# Patient Record
Sex: Female | Born: 1970
Health system: Southern US, Community
[De-identification: ages and names within clinical notes are randomized; demographics above are authoritative.]

## PROBLEM LIST (undated history)

## (undated) DIAGNOSIS — G4730 Sleep apnea, unspecified: Secondary | ICD-10-CM

## (undated) DIAGNOSIS — J302 Other seasonal allergic rhinitis: Secondary | ICD-10-CM

## (undated) DIAGNOSIS — K651 Peritoneal abscess: Secondary | ICD-10-CM

## (undated) DIAGNOSIS — E785 Hyperlipidemia, unspecified: Secondary | ICD-10-CM

## (undated) DIAGNOSIS — T7840XA Allergy, unspecified, initial encounter: Secondary | ICD-10-CM

## (undated) DIAGNOSIS — M1990 Unspecified osteoarthritis, unspecified site: Secondary | ICD-10-CM

## (undated) DIAGNOSIS — Z8742 Personal history of other diseases of the female genital tract: Secondary | ICD-10-CM

## (undated) DIAGNOSIS — K219 Gastro-esophageal reflux disease without esophagitis: Secondary | ICD-10-CM

## (undated) DIAGNOSIS — Z932 Ileostomy status: Secondary | ICD-10-CM

## (undated) DIAGNOSIS — K5720 Diverticulitis of large intestine with perforation and abscess without bleeding: Secondary | ICD-10-CM

## (undated) DIAGNOSIS — Z9889 Other specified postprocedural states: Secondary | ICD-10-CM

## (undated) DIAGNOSIS — R011 Cardiac murmur, unspecified: Secondary | ICD-10-CM

## (undated) DIAGNOSIS — N943 Premenstrual tension syndrome: Secondary | ICD-10-CM

## (undated) DIAGNOSIS — I10 Essential (primary) hypertension: Secondary | ICD-10-CM

## (undated) DIAGNOSIS — Z8759 Personal history of other complications of pregnancy, childbirth and the puerperium: Secondary | ICD-10-CM

## (undated) DIAGNOSIS — Z8709 Personal history of other diseases of the respiratory system: Secondary | ICD-10-CM

## (undated) DIAGNOSIS — Z8619 Personal history of other infectious and parasitic diseases: Secondary | ICD-10-CM

## (undated) DIAGNOSIS — Z86010 Personal history of colonic polyps: Secondary | ICD-10-CM

## (undated) DIAGNOSIS — Z90711 Acquired absence of uterus with remaining cervical stump: Secondary | ICD-10-CM

## (undated) DIAGNOSIS — IMO0002 Reserved for concepts with insufficient information to code with codable children: Secondary | ICD-10-CM

## (undated) DIAGNOSIS — R51 Headache: Secondary | ICD-10-CM

## (undated) DIAGNOSIS — D249 Benign neoplasm of unspecified breast: Secondary | ICD-10-CM

## (undated) DIAGNOSIS — M549 Dorsalgia, unspecified: Secondary | ICD-10-CM

## (undated) HISTORY — DX: Personal history of other infectious and parasitic diseases: Z86.19

## (undated) HISTORY — DX: Ileostomy status: Z93.2

## (undated) HISTORY — DX: Benign neoplasm of unspecified breast: D24.9

## (undated) HISTORY — DX: Hyperlipidemia, unspecified: E78.5

## (undated) HISTORY — DX: Personal history of colonic polyps: Z86.010

## (undated) HISTORY — DX: Personal history of other complications of pregnancy, childbirth and the puerperium: Z87.59

## (undated) HISTORY — DX: Cardiac murmur, unspecified: R01.1

## (undated) HISTORY — DX: Personal history of other diseases of the female genital tract: Z87.42

## (undated) HISTORY — DX: Allergy, unspecified, initial encounter: T78.40XA

## (undated) HISTORY — DX: Gastro-esophageal reflux disease without esophagitis: K21.9

## (undated) HISTORY — DX: Other specified postprocedural states: Z98.890

## (undated) HISTORY — DX: Personal history of other diseases of the respiratory system: Z87.09

## (undated) HISTORY — DX: Sleep apnea, unspecified: G47.30

## (undated) HISTORY — DX: Reserved for concepts with insufficient information to code with codable children: IMO0002

## (undated) HISTORY — DX: Unspecified osteoarthritis, unspecified site: M19.90

## (undated) HISTORY — PX: COLON SURGERY: SHX602

## (undated) HISTORY — PX: COLONOSCOPY: SHX174

## (undated) HISTORY — PX: ABDOMINAL HYSTERECTOMY: SHX81

## (undated) HISTORY — DX: Peritoneal abscess: K65.1

## (undated) HISTORY — DX: Dorsalgia, unspecified: M54.9

## (undated) HISTORY — DX: Premenstrual tension syndrome: N94.3

## (undated) HISTORY — DX: Other seasonal allergic rhinitis: J30.2

## (undated) HISTORY — DX: Acquired absence of uterus with remaining cervical stump: Z90.711

---

## 1998-02-18 DIAGNOSIS — Z9889 Other specified postprocedural states: Secondary | ICD-10-CM

## 1998-02-18 DIAGNOSIS — R87619 Unspecified abnormal cytological findings in specimens from cervix uteri: Secondary | ICD-10-CM

## 1998-02-18 DIAGNOSIS — IMO0002 Reserved for concepts with insufficient information to code with codable children: Secondary | ICD-10-CM

## 1998-02-18 HISTORY — PX: CERVICAL CONE BIOPSY: SUR198

## 1998-02-18 HISTORY — DX: Reserved for concepts with insufficient information to code with codable children: IMO0002

## 1998-02-18 HISTORY — DX: Other specified postprocedural states: Z98.890

## 1998-02-18 HISTORY — DX: Unspecified abnormal cytological findings in specimens from cervix uteri: R87.619

## 2002-02-18 HISTORY — PX: BREAST SURGERY: SHX581

## 2003-02-05 ENCOUNTER — Emergency Department (HOSPITAL_COMMUNITY): Admission: EM | Admit: 2003-02-05 | Discharge: 2003-02-05 | Payer: Self-pay | Admitting: Emergency Medicine

## 2003-02-19 DIAGNOSIS — Z8759 Personal history of other complications of pregnancy, childbirth and the puerperium: Secondary | ICD-10-CM

## 2003-02-19 DIAGNOSIS — Z5189 Encounter for other specified aftercare: Secondary | ICD-10-CM

## 2003-02-19 HISTORY — DX: Personal history of other complications of pregnancy, childbirth and the puerperium: Z87.59

## 2003-02-19 HISTORY — PX: ECTOPIC PREGNANCY SURGERY: SHX613

## 2003-02-19 HISTORY — DX: Encounter for other specified aftercare: Z51.89

## 2003-02-19 HISTORY — PX: SALPINGECTOMY: SHX328

## 2005-06-16 ENCOUNTER — Emergency Department (HOSPITAL_COMMUNITY): Admission: AD | Admit: 2005-06-16 | Discharge: 2005-06-16 | Payer: Self-pay | Admitting: Emergency Medicine

## 2006-02-18 HISTORY — PX: MYOMECTOMY: SHX85

## 2006-05-01 ENCOUNTER — Encounter: Payer: Self-pay | Admitting: Family Medicine

## 2006-05-01 LAB — CONVERTED CEMR LAB: Prolactin: 5.7 ng/mL

## 2006-07-09 ENCOUNTER — Encounter: Admission: RE | Admit: 2006-07-09 | Discharge: 2006-07-09 | Payer: Self-pay | Admitting: Obstetrics and Gynecology

## 2006-07-20 DIAGNOSIS — Z9889 Other specified postprocedural states: Secondary | ICD-10-CM

## 2006-07-20 HISTORY — DX: Other specified postprocedural states: Z98.890

## 2006-08-01 ENCOUNTER — Ambulatory Visit (HOSPITAL_COMMUNITY): Admission: RE | Admit: 2006-08-01 | Discharge: 2006-08-02 | Payer: Self-pay | Admitting: Obstetrics and Gynecology

## 2006-08-01 ENCOUNTER — Encounter (INDEPENDENT_AMBULATORY_CARE_PROVIDER_SITE_OTHER): Payer: Self-pay | Admitting: Obstetrics and Gynecology

## 2006-09-17 ENCOUNTER — Encounter (INDEPENDENT_AMBULATORY_CARE_PROVIDER_SITE_OTHER): Payer: Self-pay | Admitting: *Deleted

## 2006-09-22 ENCOUNTER — Ambulatory Visit: Payer: Self-pay | Admitting: Family Medicine

## 2006-09-22 DIAGNOSIS — L609 Nail disorder, unspecified: Secondary | ICD-10-CM | POA: Insufficient documentation

## 2006-12-20 DIAGNOSIS — Z8742 Personal history of other diseases of the female genital tract: Secondary | ICD-10-CM

## 2006-12-20 HISTORY — DX: Personal history of other diseases of the female genital tract: Z87.42

## 2007-02-19 HISTORY — PX: POLYPECTOMY: SHX149

## 2007-02-19 HISTORY — PX: DILATION AND CURETTAGE OF UTERUS: SHX78

## 2007-04-25 ENCOUNTER — Emergency Department (HOSPITAL_COMMUNITY): Admission: EM | Admit: 2007-04-25 | Discharge: 2007-04-25 | Payer: Self-pay | Admitting: Family Medicine

## 2007-06-08 ENCOUNTER — Ambulatory Visit (HOSPITAL_COMMUNITY): Admission: RE | Admit: 2007-06-08 | Discharge: 2007-06-08 | Payer: Self-pay | Admitting: Obstetrics and Gynecology

## 2007-06-08 ENCOUNTER — Encounter (INDEPENDENT_AMBULATORY_CARE_PROVIDER_SITE_OTHER): Payer: Self-pay | Admitting: Obstetrics and Gynecology

## 2007-07-09 ENCOUNTER — Encounter (INDEPENDENT_AMBULATORY_CARE_PROVIDER_SITE_OTHER): Payer: Self-pay | Admitting: Family Medicine

## 2007-08-03 ENCOUNTER — Ambulatory Visit: Payer: Self-pay | Admitting: Family Medicine

## 2007-08-03 ENCOUNTER — Encounter: Payer: Self-pay | Admitting: *Deleted

## 2007-08-03 DIAGNOSIS — J029 Acute pharyngitis, unspecified: Secondary | ICD-10-CM | POA: Insufficient documentation

## 2007-08-03 LAB — CONVERTED CEMR LAB: Rapid Strep: NEGATIVE

## 2007-08-18 ENCOUNTER — Ambulatory Visit: Payer: Self-pay | Admitting: Family Medicine

## 2007-08-18 DIAGNOSIS — M25519 Pain in unspecified shoulder: Secondary | ICD-10-CM

## 2007-08-25 ENCOUNTER — Encounter: Payer: Self-pay | Admitting: Family Medicine

## 2007-08-25 DIAGNOSIS — L2089 Other atopic dermatitis: Secondary | ICD-10-CM

## 2008-02-19 HISTORY — PX: ROBOTIC ASSISTED LAP VAGINAL HYSTERECTOMY: SHX2362

## 2008-08-11 ENCOUNTER — Encounter: Payer: Self-pay | Admitting: Family Medicine

## 2008-08-18 DIAGNOSIS — Z90711 Acquired absence of uterus with remaining cervical stump: Secondary | ICD-10-CM

## 2008-08-18 HISTORY — DX: Acquired absence of uterus with remaining cervical stump: Z90.711

## 2008-08-19 ENCOUNTER — Encounter (INDEPENDENT_AMBULATORY_CARE_PROVIDER_SITE_OTHER): Payer: Self-pay | Admitting: Obstetrics and Gynecology

## 2008-08-19 ENCOUNTER — Ambulatory Visit (HOSPITAL_COMMUNITY): Admission: RE | Admit: 2008-08-19 | Discharge: 2008-08-20 | Payer: Self-pay | Admitting: Obstetrics and Gynecology

## 2008-11-02 ENCOUNTER — Encounter: Payer: Self-pay | Admitting: Family Medicine

## 2008-11-09 ENCOUNTER — Encounter (INDEPENDENT_AMBULATORY_CARE_PROVIDER_SITE_OTHER): Payer: Self-pay | Admitting: *Deleted

## 2008-11-26 ENCOUNTER — Encounter (INDEPENDENT_AMBULATORY_CARE_PROVIDER_SITE_OTHER): Payer: Self-pay | Admitting: *Deleted

## 2008-11-26 DIAGNOSIS — Z87891 Personal history of nicotine dependence: Secondary | ICD-10-CM

## 2009-06-13 ENCOUNTER — Ambulatory Visit: Payer: Self-pay | Admitting: Family Medicine

## 2009-06-14 ENCOUNTER — Encounter: Payer: Self-pay | Admitting: *Deleted

## 2009-11-06 ENCOUNTER — Emergency Department (HOSPITAL_COMMUNITY)
Admission: EM | Admit: 2009-11-06 | Discharge: 2009-11-06 | Payer: Self-pay | Source: Home / Self Care | Admitting: Family Medicine

## 2009-11-08 ENCOUNTER — Emergency Department (HOSPITAL_COMMUNITY)
Admission: EM | Admit: 2009-11-08 | Discharge: 2009-11-08 | Payer: Self-pay | Source: Home / Self Care | Admitting: Family Medicine

## 2009-11-15 ENCOUNTER — Encounter: Admission: RE | Admit: 2009-11-15 | Discharge: 2009-11-15 | Payer: Self-pay | Admitting: Obstetrics and Gynecology

## 2009-11-16 ENCOUNTER — Emergency Department (HOSPITAL_COMMUNITY): Admission: EM | Admit: 2009-11-16 | Discharge: 2009-11-16 | Payer: Self-pay | Admitting: Emergency Medicine

## 2009-11-18 HISTORY — PX: BREAST LUMPECTOMY: SHX2

## 2009-11-23 ENCOUNTER — Encounter: Admission: RE | Admit: 2009-11-23 | Discharge: 2009-11-23 | Payer: Self-pay | Admitting: Obstetrics and Gynecology

## 2009-12-15 ENCOUNTER — Ambulatory Visit (HOSPITAL_BASED_OUTPATIENT_CLINIC_OR_DEPARTMENT_OTHER): Admission: RE | Admit: 2009-12-15 | Discharge: 2009-12-15 | Payer: Self-pay | Admitting: General Surgery

## 2009-12-15 ENCOUNTER — Encounter: Admission: RE | Admit: 2009-12-15 | Discharge: 2009-12-15 | Payer: Self-pay | Admitting: General Surgery

## 2010-03-11 ENCOUNTER — Encounter: Payer: Self-pay | Admitting: Obstetrics and Gynecology

## 2010-03-20 NOTE — Assessment & Plan Note (Signed)
Summary: CONGESTED/SINUS/[facility]   Vital Signs:  Patient profile:   40 year old female Weight:      171 pounds Temp:     98.8 degrees F oral BP sitting:   124 / 80  (right arm)  Vitals Entered By: Arlyss Repress CMA, (June 13, 2009 1:59 PM) CC: right ear pain, chills, sinus pressure, cough and congestion x 2 days. Is Patient Diabetic? No Pain Assessment Patient in pain? yes     Location: head Intensity: 6 Onset of pain  x2d   Primary Care Provider:  Denny Levy MD  CC:  right ear pain, chills, sinus pressure, and cough and congestion x 2 days.Marland Kitchen  History of Present Illness: [name] comes in today for URI symptoms since yesterday.  Woke up with a pounding frontal headache, sharp right ear pain, nasal congestion and dry cough.  Last night felt sweaty and chilled but did not take her temp.  No sore throat.  Tried some afrin which helped temporarily.  Had difficulty breathing last night (through her nose).  Aches all over.  She does smoke.  She is allergic to PCN and Keflex.   Habits & Providers  Alcohol-Tobacco-Diet     Tobacco Status: current     Tobacco Counseling: to quit use of tobacco products  Current Medications (verified): 1)  Fluticasone Propionate 50 Mcg/act  Susp (Fluticasone Propionate) .... 2 Sprays Into Each Nostril Daily For 10 Days For Sinus Inflammation 2)  Triderm 0.1 %  Crea (Triamcinolone Acetonide) .... Apply Once Daily or Two Times A Day To Affected Area As Directed Disp 30 G Tube 3)  Clobetasol Propionate 0.05 % Oint (Clobetasol Propionate) .... Apply To Affected Area Two Times A Day X2 Weeks. Dispense One 60 Gram Tube 4)  Taclonex 0.005-0.064 % Oint (Calcipotriene-Betameth Diprop) .... Apply To Affected Areas On Arms and Hands Once Daily For Only 4 Weeks Total Disp Qs For 100 Gram Per Week 5)  Fexofenadine Hcl 180 Mg Tabs (Fexofenadine Hcl) .Marland Kitchen.. 1 Tab By Mouth Daily 6)  Azithromycin 250 Mg Tabs (Azithromycin) .... 2 Tab By Mouth On Day 1 Then 1 Tab By Mouth X 6  Days  Allergies (verified): 1)  ! Penicillin 2)  ! Cephalosporins  Physical Exam  General:  alert, well-developed, NAD vitals reviewed Head:  TTP over bilateral maxillary and frontal sinuses Eyes:  pupils equal, pupils round, corneas and lenses clear, and no injection.   Ears:  L ear normal, right ear dull with fluid behind the drum Nose:  nasal mucosa edematous and pale, clear rhinorrhea Mouth:  oropharynx with postnasal drip Lungs:  Normal respiratory effort, chest expands symmetrically. Lungs are clear to auscultation, no crackles or wheezes. Heart:  Normal rate and regular rhythm. S1 and S2 normal without gallop, murmur, click, rub or other extra sounds. Cervical Nodes:  No lymphadenopathy noted   Impression & Recommendations:  Problem # 1:  SINUSITIS (ICD-473.9) Assessment New  Flonase nasal and allegra to open nasal passages and dry up secretions.  If no better in 2-3 days, can take course of azithromycin. (Allergic to PCN and CSPNs).   Her updated medication list for this problem includes:    Fluticasone Propionate 50 Mcg/act Susp (Fluticasone propionate) .Marland Kitchen... 2 sprays into each nostril daily for 10 days for sinus inflammation    Azithromycin 250 Mg Tabs (Azithromycin) .Marland Kitchen... 2 tab by mouth on day 1 then 1 tab by mouth x 6 days  Orders: [facility]- Est  Level 4 ([address])  Complete Medication List:  1)  Fluticasone Propionate 50 Mcg/act Susp (Fluticasone propionate) .... 2 sprays into each nostril daily for 10 days for sinus inflammation 2)  Triderm 0.1 % Crea (Triamcinolone acetonide) .... Apply once daily or two times a day to affected area as directed disp 30 g tube 3)  Clobetasol Propionate 0.05 % Oint (Clobetasol propionate) .... Apply to affected area two times a day x2 weeks. dispense one 60 gram tube 4)  Taclonex 0.005-0.064 % Oint (Calcipotriene-betameth diprop) .... Apply to affected areas on arms and hands once daily for only 4 weeks total disp qs for 100 gram per week 5)   Fexofenadine Hcl 180 Mg Tabs (Fexofenadine hcl) .Marland Kitchen.. 1 tab by mouth daily 6)  Azithromycin 250 Mg Tabs (Azithromycin) .... 2 tab by mouth on day 1 then 1 tab by mouth x 6 days Prescriptions: FLUTICASONE PROPIONATE 50 MCG/ACT  SUSP (FLUTICASONE PROPIONATE) 2 sprays into each nostril daily for 10 days for sinus inflammation  #1 x 3   Entered and Authorized by:   Ardeen Garland  MD   Signed by:   Ardeen Garland  MD on 06/13/2009   Method used:   Electronically to        [facility] [address] [phone]* (retail)       [address]       [place], [place]  [address]       Ph: [phone]       Fax: [phone]   RxID:   [id number] AZITHROMYCIN 250 MG TABS (AZITHROMYCIN) 2 tab by mouth on day 1 then 1 tab by mouth x 6 days  #8 x 0   Entered and Authorized by:   Ardeen Garland  MD   Signed by:   Ardeen Garland  MD on 06/13/2009   Method used:   Electronically to        [facility] [address] [phone]* (retail)       [address]       [place], [place]  [address]       Ph: [phone]       Fax: [phone]   RxID:   [id number] FEXOFENADINE HCL 180 MG TABS (FEXOFENADINE HCL) 1 tab by mouth daily  #30 x 3   Entered and Authorized by:   Ardeen Garland  MD   Signed by:   Ardeen Garland  MD on 06/13/2009   Method used:   Electronically to        [facility] [address] [phone]* (retail)       [address]       [place], [place]  [address]       Ph: [phone]       Fax: [phone]   RxID:   [id number]

## 2010-03-20 NOTE — Letter (Signed)
Summary: Out of Work  [facility]  [address]   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         Sincerely,    Loralee Pacas CMA

## 2010-05-02 LAB — DIFFERENTIAL
Basophils Absolute: 0 10*3/uL (ref 0.0–0.1)
Eosinophils Absolute: 0.2 10*3/uL (ref 0.0–0.7)
Lymphocytes Relative: 43 % (ref 12–46)
Lymphs Abs: 3.1 10*3/uL (ref 0.7–4.0)
Neutrophils Relative %: 42 % — ABNORMAL LOW (ref 43–77)

## 2010-05-02 LAB — CBC
Platelets: 384 10*3/uL (ref 150–400)
RBC: 4.26 MIL/uL (ref 3.87–5.11)
WBC: 7.2 10*3/uL (ref 4.0–10.5)

## 2010-05-02 LAB — BASIC METABOLIC PANEL
Calcium: 9.6 mg/dL (ref 8.4–10.5)
Creatinine, Ser: 0.67 mg/dL (ref 0.4–1.2)
GFR calc Af Amer: >60 mL/min (ref >60)

## 2010-05-02 LAB — POCT HEMOGLOBIN-HEMACUE: Hemoglobin: 13.3 g/dL (ref 12.0–15.0)

## 2010-05-27 LAB — CBC
HCT: 38.8 % (ref 36.0–46.0)
Platelets: 225 10*3/uL (ref 150–400)
WBC: 16.3 10*3/uL — ABNORMAL HIGH (ref 4.0–10.5)

## 2010-05-28 LAB — PREGNANCY, URINE: Preg Test, Ur: NEGATIVE

## 2010-05-28 LAB — BASIC METABOLIC PANEL
BUN: 4 mg/dL — ABNORMAL LOW (ref 6–23)
Calcium: 10 mg/dL (ref 8.4–10.5)
Creatinine, Ser: 0.75 mg/dL (ref 0.4–1.2)
GFR calc non Af Amer: >60 mL/min (ref >60)
Potassium: 3.5 mEq/L (ref 3.5–5.1)

## 2010-05-28 LAB — ABO/RH: ABO/RH(D): A POS

## 2010-05-28 LAB — CBC
HCT: 45.9 % (ref 36.0–46.0)
Platelets: 263 10*3/uL (ref 150–400)
WBC: 7.5 10*3/uL (ref 4.0–10.5)

## 2010-05-28 LAB — TYPE AND SCREEN: Antibody Screen: NEGATIVE

## 2010-06-21 ENCOUNTER — Other Ambulatory Visit: Payer: Self-pay | Admitting: Family Medicine

## 2010-06-21 MED ORDER — FLUTICASONE PROPIONATE 50 MCG/ACT NA SUSP: NASAL | Status: DC

## 2010-06-21 NOTE — Telephone Encounter (Signed)
Received refill request for fluticasone prop 50 mcg. Dr. Mauricio Po verbally gives order that this may be refilled
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
## 2010-07-03 NOTE — Op Note (Signed)
[name], [name]          ACCOUNT NO.:  [id number]   MEDICAL RECORD NO.:  [id number]          PATIENT TYPE:  AMB   LOCATION:  DAY                          FACILITY:  [facility]   PHYSICIAN:  Crist Fat. Rivard, M.D. DATE OF BIRTH:  [birth year]   DATE OF PROCEDURE:  08/01/2006  DATE OF DISCHARGE:                               OPERATIVE REPORT   PREOPERATIVE DIAGNOSIS:  Symptomatic uterine fibroids.   POSTOPERATIVE DIAGNOSIS:  Symptomatic uterine fibroids with pelvic  adhesions.   ANESTHESIA:  General.   ANESTHESIOLOGIST:  Jenelle Mages. Rica Mast, M.D.   PROCEDURE:  Robotic-assisted laparoscopy and myomectomy.   SURGEON:  Crist Fat. Rivard, M.D.   ASSISTANTMarquis Lunch. Lowell Guitar, P.A.   ESTIMATED BLOOD LOSS:  Less than 100 mL.   PROCEDURE:  After being informed of the planned procedure with possible  complications including bleeding, infection, injury to other organs,  need for laparotomy, transient postop edema, hospital stay and expected  recovery, informed consent is obtained.  The patient is taken to OR #10  and given general anesthesia with endotracheal intubation without  complication.  She is placed in the lithotomy position on a sticky  mattress with her arms padded and tucked on each side, as well as knee-  high sequential compression devices.  Her chest is then taped to the  table.  She is prepped and draped in a sterile fashion.  Her pelvic exam  reveals an anteverted uterus with a dominant left cornual fibroid and 2  adnexa that feel normal.  A weighted speculum is inserted, anterior lip  of the cervix is grasped with a tenaculum and forceps in the uterus is  sounded at 8.5 cm; this allows easy entry of a ZUMI intrauterine  manipulator and the balloon is inflated with saline, 5 mL.  The  tenaculum forceps is left on the anterior lip to allow [place] to retract the  uterus.  A Foley catheter is then inserted.   The supraumbilical incision is decided at 15 cm above the  fundal height;  this location is infiltrated with Marcaine 0.25 and we perform a  vertical 10-mm incision, which is brought down to the fascia.  Fascia is  grasped with Kocher forceps and incised and the peritoneum is entered  bluntly.  A pursestring suture of 0 Vicryl is placed on the fascia and a  10-mm Hasson trocar is inserted easily and held in place with  pursestring suture; this allows [place] to insufflate a pneumoperitoneum with  CO2 at a maximum pressure of 15 mmHg.  Trocar placement is then measured  in the site and then we place two 8-mm robotic trocars on the left and  one 8-mm robotic trocar on the right, as well as a 10-mm patient-side  assistant trocar; all of these trocars are inserted under direct  visualization after infiltrating with Marcaine 0.25%.   We can now dock the robot and all this preparation is completed in 40  minutes.   The camera is inserted as well as a monopolar scissors in arm #1, Gyrus  PK forceps in arm #2 and tenaculum forceps in arm #3.  Observation:  The uterus is enlarged by a dominant left cornual fibroid  measuring approximately 6 cm.  We also identify 2 lower uterine segment  posterior fibroids measuring 1.5 to 2 cm, 1 subserosal fibroid in the  right broad ligament measuring 1 cm.  We note the absence of a right  tube.  The right ovary is partially adherent to the uterus and those  adhesions are sharply dissected.  We also note that the bowel and is  adherent to the lower posterior portions of the uterus and those  adhesions are also sectioned and dissected sharply.  The right tube is  partially adherent to the bowel and this is also sharply dissected until  the tube is completely freed and the tube has a normal appearance with a  normal fimbriae; the left ovary is also normal.  Appendix is visualized  and normal.  Liver is visualized and normal.  Gallbladder appears a  little tensed, but otherwise normal.   We proceed with infiltration of  the left cornual fibroid using  vasopressin 1 unit per 1 mL, until complete blanching of the serosa,  which allows [place] now to open the serosa with monopolar scissors, grab the  fibroid and sharply and bluntly dissect it.  One fibroid is removed,  measuring approximately 4 cm, and a subsequent fibroid is removed  through the same incision, measuring approximately 6 cm.  This is  achieved with well-controlled hemostasis.  Instruments are then changed  for 2 needle holders and we proceed with closure in multiple layers of  this myometrial incision.  The total length of the incision is  approximately 6 cm and we can do 2 layers of figure-of-eight stitches of  0 Vicryl to reapproximate the myometrium.  Hemostasis is adequate.  We  then place our attention on the 2 small lower uterine segment posterior  fibroid and we infiltrate the serosa with vasopressin 1 unit in 1 mL,  open the serosa and grab the fibroid with tenaculum forceps, sharply and  bluntly dissect them out.  Fibroids are deposited in the posterior cul-  de-sac.  The small right broad ligament fibroid is also removed in a  similar fashion.  After evaluating the uterus, decision is made to  proceed with removal of another fibroid identified on the upper  posterior wall.  We infiltrate the serosa with vasopressin 1 unit in 1  mL until complete blanching, open the serosa with monopolar scissors,  grab the fibroid with tenaculum and sharply and bluntly dissect it away  from the myometrium.  Again, the myometrium is closed; this time, 1  layer is sufficient of figure-of-eight stitches of 0 Vicryl.  The lower  uterine segment defect on a 1-cm distance is closed with 2 figure-of-  eight stitches of 0 Vicryl.   Using 2-0 Vicryl, we can now close the 2 largest incisions, the upper  posterior one measuring 4 cm with a baseball stitch of 2-0 Vicryl and the anterior one measuring 6 cm with a baseball stitch of 2-0 Vicryl.  We then irrigate  profusely, note a satisfactory hemostasis and move for  morcellation.   The 10-mm assistant trocar is removed.  The incision is extended and a  15-mm morcellator is inserted easily under direct visualization.  We  then proceed with systematic morcellation of all fibroids previously  mentioned, which removes a total of 144 g of fibroid material.  The  morcellation time took 20 minutes.  We again irrigate profusely with  warm saline,  note the satisfactory hemostasis and we then place a half  sheet of Intercede on the posterior incisions and another half-sheet  Intercede on the anterior incision.  Trocars are then removed under  direct visualization after evacuating the pneumoperitoneum and after  undocking the robots.   The fascia of the supraumbilical incision is closed with the previously-  placed pursestring suture of 0 Vicryl.  The fascia of the 15 mm right-  sided incision is closed with a running suture of 0 Vicryl and incision  skin is closed with subcuticular suture of 3-0 Monocryl and Steri-  Strips.   The ZUMI intrauterine manipulator is removed.  The tenaculum was removed  during the procedure inadvertently.  The speculum is inserted to  evaluate the anterior lip of the cervix and a small laceration is  repaired with 2 figure-of-eight stitches of 2-0 Vicryl.  Hemostasis is  adequate.   Instrument and sponge count is complete x2.  Estimated blood loss is  less than 100 mL.  The procedure is very well tolerated by the patient,  who is taken to recovery room in a well and stable condition.  Total  operative time from start time to the end of the procedure was 4 hours  15 minutes.      Crist Fat Rivard, M.D.  Electronically Signed     SAR/MEDQ  D:  08/01/2006  T:  08/02/2006  Job:  [id number]

## 2010-07-03 NOTE — Op Note (Signed)
[name], [name]          ACCOUNT NO.:  [id number]   MEDICAL RECORD NO.:  [id number]          PATIENT TYPE:  AMB   LOCATION:  DAY                          FACILITY:  [facility]   PHYSICIAN:  Sandra A. Rivard, M.D. DATE OF BIRTH:  1970-10-26   DATE OF PROCEDURE:  08/19/2008  DATE OF DISCHARGE:                               OPERATIVE REPORT   PREOPERATIVE DIAGNOSIS:  Symptomatic uterine fibroids, dysfunctional  uterine bleeding, and chronic pelvic pain.   POSTOPERATIVE DIAGNOSIS:  Symptomatic uterine fibroids, dysfunctional  uterine bleeding, and chronic pelvic pain with pelvic adhesions.   ANESTHESIA:  General, Dr. Rica Mast.   PROCEDURE:  Laparoscopic supracervical hysterectomy with robotic  assistance and lysis of adhesions.   SURGEON:  Crist Fat. Rivard, M.D.   ASSISTANT:  Henreitta Leber, physician's assistant.   ESTIMATED BLOOD LOSS:  Minimal.   PROCEDURE:  After being informed of the planned procedure with possible  complications including bleeding, infection, injury to bowels, bladder  or ureters, postoperative recurrent bleeding due to cervical stump,  expected hospital stay, expected recovery time, postoperative transient  facial edema and the need to pursue a fairly Pap smears, informed  consent is obtained.  The patient was taken to OR #10, given general  anesthesia with endotracheal intubation without any complication.  She  is placed in lithotomy position on a sticky mattress, arms padded and  tucked on each side with knee-high sequential compressive devices, and  her chest taped to the table.  She is prepped and draped in a sterile  fashion.  Pelvic exam reveals an anteverted uterus approximately 10  weeks' in size, mobile, and adnexa are not felt through the pelvic exam.  A weighted speculum is inserted.  Anterior lip of the cervix is grasped  with a tenaculum forceps, and we proceed with cervical dilatation using  Hegar dilator until #27.  The uterus is sounded  at 7 cm.  A 6-cm Rumi  intrauterine manipulator with a 3.5 KOH ring is easily inserted.  Balloon is inflated with 5 mL of saline.  Tenaculum is repositioned, and  weighted speculum is removed.  A Foley catheter is then inserted in her  bladder.   We infiltrate the supraumbilical previous incision with Marcaine 0.25  and perform a 1-cm vertical incision which is brought down bluntly to  the fascia.  The fascia is identified and grasped with Kocher forceps  and incised with Mayo scissors.  Peritoneum is entered bluntly.  A  pursestring suture of 0 Vicryl is placed on the fascia, and a 10-mm  Hassan trocar is easily inserted in the abdominal cavity, held in place  with the previously placed pursestring suture.  A camera is inserted to  note an irregularly shaped uterus with multiple fibroids, largest being  right cornual approximately 2.5 cm.  Adhesions from the colon to the  fundus and posterior aspect of the uterus is noted, so posterior cul-de-  sac is not visible at this time.  Anterior cul-de-sac appears normal.  Appendix is visualized and normal.   Trocar placement is decided, and we are going to use the previous trocar  site from her surgery  2 years ago.  We then placed two 8-mm robotic  trocars on the left after infiltrating the sites with Marcaine 0.25  under direct visualization, one 8-mm robotic trocar on the right after  infiltrating with Marcaine 0.25, and one 10-mm right-sided patient-side  assistant trocar after infiltrating with Marcaine 0.25.  The robot is  docked on the patient's left side.  Her legs are lowered.  Preparation  and docking time is complete in 45 minutes.   A monopolar scissors is placed in arm #1, a PK gyrus forceps in arm #2,  and a Cobra forceps in arm #3.  We initiate the procedure with  systematic lysis of adhesion using monopolar scissors and traction.  This is done in an easy fashion, freeing the fundus of the uterus.  We  then see the posterior  cul-de-sac which has small filmy adhesions that  are easily removed.  There is also adhesion from the right ovary that is  completely stuck to the posterior right aspect of the uterus, and that  is also sharply dissected until the ovary is freed.  There is no right  tube, the patient being status post right salpingectomy.  The left ovary  appears normal and the left tube shows a sign of tubal ligation.   We start from the left side and cauterize the utero-ovarian ligament and  section it, cauterize the tube and section it, cauterize the round  ligament and section it.  This gives [place] access to the broad ligament,  and the anterior sheath of the broad ligament is easily dissected  sharply all the way through the anterior cul-de-sac.  The dissection is  easily performed, and the bladder is easily pushed back from the vaginal  fornix which is easily identified with the KOH ring.  The posterior  sheath of the left-sided broad ligament is then sharply dissected to  skeletonize the uterine artery, keeping the left ureter under direct  visualization.  The vessels are then easily identified and cauterized in  their ascending branches near the KOH ring.  We moved to the right side  and proceed in the exact same fashion, initially cauterizing the utero-  ovarian ligament and sectioning it, cauterizing the round ligament and  sectioning it, entering the broad ligament anteriorly, sharply  dissecting, pushing the bladder down below the KOH ring.  The posterior  sheath of the broad ligament is also brought down sharply to the level  of the uterosacral ligament, keeping the right ureter under direct  visualization.  This gives [place] a complete skeletonization of the uterine  vessels.  Those are then grasped at their ascending location near the  KOH ring and cauterized.  The uterine vessels on both sides are  cauterized with pressure applied on the KOH ring and both ureters under  direct visualization.  We  then proceed with the supracervical  hysterectomy and move 1 cm above the KOH ring.  Using an open monopolar  scissor, we are able to section the body of the uterus away from the  cervix in a 360-degree fashion.  The uterus is then deposited in the  posterior cul-de-sac.  The Rumi is deflated and removed, and the  cervical canal is systematically cauterized, initially with bipolar  cauterization, completed with monopolar cauterization.  We irrigate  profusely with warm saline, notice satisfactory hemostasis on all  pedicles, and also note good peristalsis on both ureters.  Console time  is 1 hour and 16 minutes.  All instruments are removed.  The robot is  undocked.  The 10-mm patient-side robotic trocar is removed and changed  for a morcellator.  We then proceed with systematic morcellation of the  specimen without complication over a 7-minute period.   We again irrigate profusely with warm saline, note a satisfactory  hemostasis, and placed a full sheet of intercede over the cervical  stump.  All trocars are then removed after evacuating the  pneumoperitoneum.  The fascia of the supraumbilical incision is closed  with previously placed pursestring suture of 0 Vicryl.  The fascia of  the 10-mm right-sided trocar is closed with a figure-of-eight stitch of  0 Vicryl.  All incisions are then closed with subcuticular suture of 3-0  Monocryl and Dermabond.  The KOH ring is removed from the vagina.  Cervix is evaluated, and a bleeding site at the tenaculum is controlled  with application of silver nitrate.   Instrument and sponge count is complete x2.  Estimated blood loss is  minimal.  The procedure is well tolerated by the patient who is taken to  recovery room in a well and stable condition.   SPECIMEN:  Body of the uterus without cervix sent to pathology weighing  84 g.      Crist Fat Rivard, M.D.  Electronically Signed     SAR/MEDQ  D:  08/19/2008  T:  08/19/2008  Job:   [id number]

## 2010-07-03 NOTE — H&P (Signed)
[name], [name]          ACCOUNT NO.:  [id number]   MEDICAL RECORD NO.:  [id number]          PATIENT TYPE:  AMB   LOCATION:  DAY                          FACILITY:  [facility]   PHYSICIAN:  Sandra A. Rivard, M.D. DATE OF BIRTH:  [birth year]   DATE OF ADMISSION:  DATE OF DISCHARGE:                              HISTORY & PHYSICAL   HISTORY OF PRESENT ILLNESS:  [name] is a 40 year old married  African American female, para 2-0-2-2, presenting for robotic  supracervical hysterectomy because of symptomatic uterine fibroids,  pelvic pain, and dysfunctional uterine bleeding.  Up until approximately  2 years ago when the patient underwent a myomectomy, the patient's  menses occurred every 18-20 days, flow for 7 days with clotting, and  required the change of a large pad 8 times daily.  Additionally, she  would experience cramping rated as 6/10 on a 10-point pain scale;  decreasing to 4/10 on a 10-point pain scale with ibuprofen 800 mg.  An  endometrial biopsy done at that time was within normal limits showing no  atypia, hyperplasia or malignancy.  Following the myomectomy the  patient's menstrual flow decreased to 7 days with a pad change every 3  hours and decreased clotting.  The patient's cramping remained, but she  achieved greater relief with her usual ibuprofen dosage compared with  relief before the surgery.  A month after patient's myomectomy she  reported intermenstrual bleeding, and a sonohistogram in March 2009  showed an endometrial polyp measuring 0.86 cm x 0.64 cm and a  questionable submucosal fibroid measuring 1.58 x 1.34 x 1.52 cm.  An  anterior fibroid was also observed measuring 1.81 x 1.60 x 1.64 cm.  Uterus and ovaries were within normal size on that study.  Subsequently  patient underwent a hysteroscopic removal of a benign polyp.  No  hyperplasia or malignancy was identified on pathology.  This patient's  first cycle following this procedure was 7 days  long but very, very  light, and then 23 days later she resumed the heavy menstrual bleeding  with clotting and severe cramping.  The patient was given a trial of  Depo-Provera and Toradol in an effort to manage her symptoms, but she  bled daily with the Depo-Provera and though the Toradol did  significantly diminish  her pain it made her so sleepy she could not  function at work.  A pelvic ultrasound March 2010 showed a uterus  measuring 8.16 cm x 5.09 cm x 5.12 cm with a left ovary measuring 2.20  cm x 2.44 cm x 1.56 cm, a right ovary measuring 2.30 cm x 2.79 cm x 1.25  cm.  There was an anterior fibroid measuring 2.01 x 1.38 x 1.79 cm,  another anterior fibroid measuring 1.68 x 1.58 x 1.67 cm, a posterior  fibroid measuring 1.88 x 1.39 x 1.80 cm, and multiple small fibroids all  of which were less than 1 cm.  The patient denied any urinary tract  symptoms, changes in her bowel habits, fever or vaginitis symptoms.  After careful consideration of the chronicity and disruptive nature of  her symptoms along with both  medical and surgical management options,  the patient has decided to proceed with definitive therapy in the form  of hysterectomy.   OBSTETRIC HISTORY:  Gravida 4, para 2-0-2-2.  The patient had 2  spontaneous vaginal births.  She also had an ectopic in 2005 for which  she underwent salpingectomy.   GYNECOLOGICAL HISTORY:  Menarche 40 years old.  The patient uses Depo-  Provera.  Therefore her periods are irregular.  She had an abnormal Pap  smear in 2000 requiring a cone biopsy.  She does have a history of  syphilis.  The patient's Pap smears have been normal since her 2000 cone  biopsy most recent being March 2010.   MEDICAL HISTORY:  1. Premenstrual syndrome.  2. Back pain.  3. Sinusitis.  4. Gastroesophageal reflux disease.   SURGICAL HISTORY:  1. 2004 right breast fibroadenoma excision.  2. 2005 right salpingectomy secondary to ectopic pregnancy.  3. 2008 robotic  myomectomy.  4. 2009 hysteroscopy, D and C for endometrial polyp.  5. The patient does have a history of blood transfusion secondary to      her ectopic pregnancy.  Denies any problems with anesthesia.   FAMILY HISTORY:  Thyroid disease, sarcoidosis, hypertension, diabetes,  bipolar disorder, and thyroid disease.   SOCIAL HISTORY:  The patient is married.  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   HABITS:  The patient smokes 2 packs of cigarettes per week.  She  occasionally consumes alcohol.  Denies any illicit drug use.   CURRENT MEDICATIONS:  Depo-Provera last given April 06, 2008.  Next  injection due Jun 28, 2008.   ALLERGIES:  1. PENICILLIN and KEFLEX both of which cause anaphylaxis.  2. BENADRYL causes itching and a rash.   REVIEW OF SYSTEMS:  The patient does have a history of back pain.  Denies any chest pain, shortness of breath, headache, vision changes,  nausea, vomiting, diarrhea, recent weight loss, night sweats, joint  pain, muscle pain, and except as is mentioned in history present illness  the patient's review of systems is, otherwise, negative.   PHYSICAL EXAMINATION:  Blood pressure is 120/88, pulse 88, respirations  16, temperature 99.3 degrees Fahrenheit orally.  Weight is 167 pounds,  height 5 feet 5 inches tall.  Body mass index 27.8.  NECK:  Supple without masses.  There is no thyromegaly or cervical  adenopathy.  HEART:  Regular rate and rhythm.  LUNGS:  Clear.  BACK:  No CVA tenderness.  ABDOMEN:  No tenderness, masses or organomegaly.  EXTREMITIES:  No clubbing, cyanosis or edema.  EGBUS is within normal limits.  Vagina is rugous.  Cervix is nontender  without lesions.  Uterus appears upper limits of normal size without  tenderness.  Adnexa:  No tenderness or masses.  Rectovaginal:  No  tenderness or masses.   IMPRESSION:  1. Symptomatic uterine fibroids.   2. Dysfunctional uterine bleeding.  3. Pelvic pain.   DISPOSITION:  A discussion was held with patient regarding the  indications for her procedure along with its risks which include but are  not limited to reaction to anesthesia, damage to adjacent organs,  infection, and excessive bleeding.  The patient further understands that  having a supracervical hysterectomy renders her capable of developing  cervical cancer, that she may have cyclical bleeding, that there is a  possibility that a fibroid may grow on her cervical stump.  The  patient  was advised that there is no guarantee that pelvic prolapse will be  prevented by maintaining her cervix.  She is aware that she will  experience some transient postoperative facial edema, that a robotic  procedure takes longer than the traditional open abdominal approach,  that an open abdominal incision may be necessary to complete her surgery  safely, that her expected hospital stay is 1-2 days, and that she should  be able to return to her usual activities in 2-3 weeks.  The patient was  given a MiraLax bowel prep to be completed 24 hours prior to  procedure.  She verbalized understanding of all of these risk and considerations.  The patient has consented to proceed with a robotic supracervical  hysterectomy at [facility] on August 19, 2008 at 7:30  a.m.      Elmira J. Adline Peals.      Crist Fat Rivard, M.D.  Electronically Signed    EJP/MEDQ  D:  08/12/2008  T:  08/13/2008  Job:  [id number]

## 2010-07-03 NOTE — Op Note (Signed)
NAMEELIN, [name]          ACCOUNT NO.:  [id number]   MEDICAL RECORD NO.:  [id number]          PATIENT TYPE:  AMB   LOCATION:  SDC                           FACILITY:  [facility]   PHYSICIAN:  Dois Davenport A. Rivard, M.D. DATE OF BIRTH:  [birth year]   DATE OF PROCEDURE:  06/08/2007  DATE OF DISCHARGE:                               OPERATIVE REPORT   PREOPERATIVE DIAGNOSIS:  Metrorrhagia with endometrial polyp.   POSTOPERATIVE DIAGNOSIS:  Metrorrhagia with endometrial polyp.   ANESTHESIA:  General with Angelica Pou, MD   PROCEDURE:  Paracervical block, hysteroscopy, dilation and curettage.   SURGEON:  Crist Fat. Rivard, M.D., no assistant.   ESTIMATED BLOOD LOSS:  Minimal.   PROCEDURE:  After being informed of the planned procedure with possible  complications including bleeding, infection and injury to uterus,  informed consent is obtained.  The patient is taken to OR #7 given  general anesthesia without any complication and with laryngeal mask  ventilation.  She is placed in the lithotomy position, prepped and  draped in a sterile fashion and her bladder is emptied with an in-and-  out Foley catheter.   Pelvic exam reveals an anteverted uterus, slightly bulky, mobile, two  normal adnexa.  A weighted speculum is inserted, anterior lip of the  cervix was grasped with a tenaculum forceps and we proceed with a  paracervical block using Nesacaine 1% 18 mL in the usual fashion.  Uterus is sounded at 7 cm and the cervix is easily dilated using Hegar  dilator until #27 which allows easy entry of a diagnostic hysteroscope.  With sorbitol perfusion we are able to visualize the entire uterine  cavity which reveals a posterior polyp measuring approximately 0.8 cm  and a somewhat thickened endometrium in the area of the left cornua.  Both tubal ostia are visualized and normal.  We removed the hysteroscope  and perform a sharp curettage of the endometrial cavity which does  remove the polyp  initially and then removed a significant amount of  thickened endometrium.  We reinserted the hysteroscope to visualize a  free cavity, no more polyp and two normal ostia.  Hysteroscope is  removed, tenaculum is removed, instrument and sponge count is complete  x2.  Estimated blood loss is minimal.  Fluid deficit was 15 mL and the  procedure is very well tolerated by the patient who is taken to recovery  room in a well and stable condition.   SPECIMEN:  Endometrial polyp and endometrial curettings sent to  pathology.      Crist Fat Rivard, M.D.  Electronically Signed     SAR/MEDQ  D:  06/08/2007  T:  06/08/2007  Job:  [id number]

## 2010-10-11 ENCOUNTER — Other Ambulatory Visit (INDEPENDENT_AMBULATORY_CARE_PROVIDER_SITE_OTHER): Payer: Self-pay | Admitting: General Surgery

## 2010-10-11 DIAGNOSIS — Z1231 Encounter for screening mammogram for malignant neoplasm of breast: Secondary | ICD-10-CM

## 2010-10-19 ENCOUNTER — Ambulatory Visit
Admission: RE | Admit: 2010-10-19 | Discharge: 2010-10-19 | Disposition: A | Discharge status: Home / Self Care | Payer: BC Managed Care – PPO | Source: Ambulatory Visit | Attending: General Surgery | Admitting: General Surgery

## 2010-10-19 DIAGNOSIS — Z1231 Encounter for screening mammogram for malignant neoplasm of breast: Secondary | ICD-10-CM

## 2010-11-13 LAB — CBC
Hemoglobin: 14.1
RBC: 4.41
WBC: 8.4

## 2010-11-13 LAB — PREGNANCY, URINE: Preg Test, Ur: NEGATIVE

## 2010-12-06 LAB — CBC
HCT: 40.7
Hemoglobin: 11.8 — ABNORMAL LOW
Platelets: 329
RDW: 12.2
RDW: 12.2

## 2010-12-06 LAB — BASIC METABOLIC PANEL
BUN: 7
Calcium: 9.4
GFR calc non Af Amer: >60
Glucose, Bld: 102 — ABNORMAL HIGH

## 2011-06-12 ENCOUNTER — Encounter: Payer: Self-pay | Admitting: Obstetrics and Gynecology

## 2011-06-12 ENCOUNTER — Ambulatory Visit (INDEPENDENT_AMBULATORY_CARE_PROVIDER_SITE_OTHER): Payer: BC Managed Care – PPO | Admitting: Obstetrics and Gynecology

## 2011-06-12 VITALS — BP 120/72 | Ht 65.0 in | Wt 174.0 lb

## 2011-06-12 DIAGNOSIS — Z01419 Encounter for gynecological examination (general) (routine) without abnormal findings: Secondary | ICD-10-CM

## 2011-06-12 NOTE — Progress Notes (Signed)
The patient is not taking hormone replacement therapy The patient  is not taking a Calcium supplement. Post-menopausal bleeding:no  Last Pap: was normal March  2010 Last mammogram: was normal October  2012 Last DEXA scan : T= none     Last colonoscopy:never had one   Urinary symptoms: urinary frequency Normal bowel movements: Yes Reports abuse at home: No:   Subjective:    [name] is a 41 y.o. female, G4P2, who presents for an annual exam.     History   Social History  . Marital Status: Married    Spouse Name: N/A    Number of Children: N/A  . Years of Education: N/A   Social History Main Topics  . Smoking status: Current Everyday Smoker -- 0.2 packs/day for 15 years  . Smokeless tobacco: Never Used  . Alcohol Use: 1.5 oz/week    3 drink(s) per week  . Drug Use: No  . Sexually Active: Yes   Other Topics Concern  . None   Social History Narrative  . None    Menstrual cycle:   LMP: No LMP recorded. Patient has had a hysterectomy.           Cycle: none  The following portions of the patient's history were reviewed and updated as appropriate: allergies, current medications, past family history, past medical history, past social history, past surgical history and problem list.  Review of Systems Pertinent items are noted in HPI. Breast:Negative for breast lump,nipple discharge or nipple retraction Gastrointestinal: Negative for abdominal pain, change in bowel habits or rectal bleeding Urinary:negative   Objective:    BP 120/72  Ht 5\' 5"  (1.651 m)  Wt 174 lb (78.926 kg)  BMI 28.96 kg/m2    Weight:  Wt Readings from Last 1 Encounters:  06/12/11 174 lb (78.926 kg)          BMI: Body mass index is 28.96 kg/(m^2).  General Appearance: Alert, appropriate appearance for age. No acute distress HEENT: Grossly normal Neck / Thyroid: Supple, no masses, nodes or enlargement Lungs: clear to auscultation bilaterally Back: No CVA tenderness Breast Exam: No  dimpling, nipple retraction or discharge. No masses or nodes. and No masses or nodes.No dimpling, nipple retraction or discharge. Cardiovascular: Regular rate and rhythm. S1, S2, no murmur Gastrointestinal: Soft, non-tender, no masses or organomegaly Pelvic Exam: cervix normal. Uterus absent. Adnexa normal Rectovaginal: normal rectal, no masses Lymphatic Exam: Non-palpable nodes in neck, clavicular, axillary, or inguinal regions Skin: no rash or abnormalities Neurologic: Normal gait and speech, no tremor  Psychiatric: Alert and oriented, appropriate affect.   Wet Prep:not applicable Urinalysis:not applicable UPT: Not done   Assessment:    Normal gyn exam  Smoker   Plan:    mammogram pap smear return annually or prn STD screening: declined Contraception:N/A Appt with EP for smoking cessation      Ayodele Hartsock AMD

## 2011-06-12 NOTE — Patient Instructions (Signed)
Patient Education Materials to be provided at check out (*indicates is located in Garment/textile technologist):  Smoking cessation

## 2011-06-13 LAB — PAP IG W/ RFLX HPV ASCU

## 2011-06-27 ENCOUNTER — Telehealth: Payer: Self-pay

## 2011-06-27 NOTE — Telephone Encounter (Signed)
Pt c/o severe night sweats and pelvic pain.  Appt made for 07-03-11  Pt agreeable.  ld

## 2011-07-03 ENCOUNTER — Encounter: Payer: BC Managed Care – PPO | Admitting: Obstetrics and Gynecology

## 2011-10-15 ENCOUNTER — Emergency Department (INDEPENDENT_AMBULATORY_CARE_PROVIDER_SITE_OTHER): Payer: BC Managed Care – PPO

## 2011-10-15 ENCOUNTER — Other Ambulatory Visit: Payer: Self-pay | Admitting: Obstetrics and Gynecology

## 2011-10-15 ENCOUNTER — Emergency Department (HOSPITAL_COMMUNITY)
Admission: EM | Admit: 2011-10-15 | Discharge: 2011-10-15 | Disposition: A | Discharge status: Home / Self Care | Payer: BC Managed Care – PPO | Source: Home / Self Care | Attending: Emergency Medicine | Admitting: Emergency Medicine

## 2011-10-15 ENCOUNTER — Encounter (HOSPITAL_COMMUNITY): Payer: Self-pay

## 2011-10-15 DIAGNOSIS — M7580 Other shoulder lesions, unspecified shoulder: Secondary | ICD-10-CM

## 2011-10-15 DIAGNOSIS — Z1231 Encounter for screening mammogram for malignant neoplasm of breast: Secondary | ICD-10-CM

## 2011-10-15 DIAGNOSIS — L0291 Cutaneous abscess, unspecified: Secondary | ICD-10-CM

## 2011-10-15 DIAGNOSIS — M719 Bursopathy, unspecified: Secondary | ICD-10-CM

## 2011-10-15 DIAGNOSIS — L0390 Cellulitis, unspecified: Secondary | ICD-10-CM

## 2011-10-15 MED ORDER — MELOXICAM 15 MG PO TABS: 15.0000 mg | ORAL_TABLET | Freq: Every day | ORAL | Status: DC

## 2011-10-15 MED ORDER — SULFAMETHOXAZOLE-TMP DS 800-160 MG PO TABS: 2.0000 | ORAL_TABLET | Freq: Two times a day (BID) | ORAL | Status: AC

## 2011-10-15 NOTE — ED Notes (Signed)
Small lesion on groin; shoulder pain for several weeks , w/o injury

## 2011-10-15 NOTE — ED Provider Notes (Signed)
Chief Complaint  Patient presents with  . Shoulder Pain    History of Present Illness:   [name] is a 41 year old female who presents tonight with 2 problems: A small boil in her right groin and left shoulder pain.  The boil in the groin has been present for 4 days. It has not drained any pus. She denies any fever or chills. She does have a history of boils in the past and what sounds like hidradenitis suppurativa. It's been years since she's had any boils. She does not have any history of MRSA, nor has she been exposed to anyone with murmurs. She denies any history of diabetes.  The left shoulder pain has been going on for 2 months. The pain is localized to posteriorly and superiorly with radiation to the upper arm but not into the neck can not below the elbow. The pain is worse with abduction and flexion. She has a good range of motion normal muscle strength. No numbness or tingling. She denies any injury to the shoulder.  Review of Systems:  Other than noted above, the patient denies any of the following symptoms: Systemic:  No fevers, chills, sweats, or aches.  No fatigue or tiredness. Musculoskeletal:  No joint pain, arthritis, bursitis, swelling, back pain, or neck pain. Neurological:  No muscular weakness, paresthesias, headache, or trouble with speech or coordination.  No dizziness.   PMFSH:  Past medical history, family history, social history, meds, and allergies were reviewed.  Physical Exam:   Vital signs:  BP 150/79  Pulse 87  Temp 98.7 F (37.1 C) (Oral)  Resp 16  SpO2 97% Gen:  Alert and oriented times 3.  In no distress. Musculoskeletal: Exam of the shoulder reveals no pain to palpation, swelling, bruising, or deformity. The shoulder has a decreased range of motion with 90 of abduction, 90 of flexion, and diminished internal and external rotation with pain on all these movements. Neer test was positive.  Hawkins test was positive.  Empty cans test was negative with normal  muscle strength. Otherwise, all joints had a full a ROM with no swelling, bruising or deformity.  No edema, pulses full. Extremities were warm and pink.  Capillary refill was brisk.  Skin:  Clear, warm and dry.  No rash. She has a tiny abscess in the right groin measuring about 8 mm in diameter. This was tender but not fluctuant and there was no drainage. Neuro:  Alert and oriented times 3.  Muscle strength was normal.  Sensation was intact to light touch.   Radiology:  Dg Shoulder Left  10/15/2011  *RADIOLOGY REPORT*  Clinical Data: Shoulder pain for 2 months.  Limited range of motion.  LEFT SHOULDER - 2+ VIEW  Comparison: None.  Findings: Imaged bones, joints and soft tissues appear normal.  IMPRESSION: Negative study.   Original Report Authenticated By: Bernadene Bell. Maricela Curet, M.D.     Assessment:  The primary encounter diagnosis was Rotator cuff tendonitis. A diagnosis of Abscess was also pertinent to this visit.  Plan:   1.  The following meds were prescribed:   New Prescriptions   MELOXICAM (MOBIC) 15 MG TABLET    Take 1 tablet (15 mg total) by mouth daily.   SULFAMETHOXAZOLE-TRIMETHOPRIM (BACTRIM DS) 800-160 MG PER TABLET    Take 2 tablets by mouth 2 (two) times daily.   2.  The patient was instructed in symptomatic care, including rest and activity, elevation, application of ice and compression.  Appropriate handouts were given. 3.  The  patient was told to return if becoming worse in any way, if no better in 3 or 4 days, and given some red flag symptoms that would indicate earlier return.   4.  The patient was told to follow up with Dr. Jene Every if no better in 2 weeks. In the meantime she was given exercises to do, should avoid sleeping on the shoulder at night, and avoid doing any type of activity with her arms above shoulder level. Additionally, the patient was offered an injection of the shoulder, but she declines.    Reuben Likes, MD 10/15/11 2056

## 2011-10-18 ENCOUNTER — Telehealth (HOSPITAL_COMMUNITY): Payer: Self-pay | Admitting: *Deleted

## 2011-10-18 NOTE — ED Notes (Signed)
Pt. called on VM 8/29 and said she has taken Mobic 15 mg. for 2 days and is c/o abdominal pain and nausea, so she stopped it. States she wants something else.  8/30 1435 I called home number and there was no answer.  Discussed with Dr. Lorenz Coaster and he said tell pt. to stop taking the Mobic and take Tylenol 325 mg. two tablets every 8 hrs regularly.  Come back if no relief.  I called pt.'s work number and gave her this information. Vassie Moselle 10/18/2011

## 2011-10-28 ENCOUNTER — Telehealth: Payer: Self-pay | Admitting: Obstetrics and Gynecology

## 2011-10-28 NOTE — Telephone Encounter (Signed)
[name]/GENERAL QUEST.

## 2011-10-29 ENCOUNTER — Telehealth: Payer: Self-pay | Admitting: Obstetrics and Gynecology

## 2011-10-29 NOTE — Telephone Encounter (Signed)
PC for pt c/o bladder problems.  NA at 11:45  ld

## 2011-10-29 NOTE — Telephone Encounter (Signed)
TRIAGE/APPT REQ. °

## 2011-10-30 ENCOUNTER — Telehealth: Payer: Self-pay | Admitting: Obstetrics and Gynecology

## 2011-10-30 NOTE — Telephone Encounter (Signed)
[name]/PHONE CALL REQ/ASAP

## 2011-10-30 NOTE — Telephone Encounter (Signed)
PC to pt attempted again. NA at work.  Closed at this time.  Cell number is not a working number.  And there is na at home number either.  Will try again later.  ld

## 2011-10-31 ENCOUNTER — Encounter: Payer: Self-pay | Admitting: Obstetrics and Gynecology

## 2011-10-31 ENCOUNTER — Ambulatory Visit (INDEPENDENT_AMBULATORY_CARE_PROVIDER_SITE_OTHER): Payer: BC Managed Care – PPO | Admitting: Obstetrics and Gynecology

## 2011-10-31 VITALS — BP 136/82 | Temp 98.4°F | Resp 16 | Wt 188.0 lb

## 2011-10-31 DIAGNOSIS — N3281 Overactive bladder: Secondary | ICD-10-CM

## 2011-10-31 DIAGNOSIS — N318 Other neuromuscular dysfunction of bladder: Secondary | ICD-10-CM

## 2011-10-31 MED ORDER — OXYBUTYNIN 3.9 MG/24HR TD PTTW: 1.0000 | MEDICATED_PATCH | TRANSDERMAL | Status: DC

## 2011-10-31 NOTE — Progress Notes (Signed)
Contraception:NONE pt had  HYST History of STD:  Per pt in the early 90's History of ovarian cyst: no History of fibroids: yes:   History of endometriosis:no Previous ultrasound:no   Gastro-intestinal symptoms:  Constipation: no     Diarrhea: no     Nausea: no     Vomiting: no     Fever: no Vaginal discharge: no vaginal discharge  Subjective:    [name] is a 41 y.o. female, G4P2, who presents for Urinary symptoms: urinary frequency. Pt states she has nocturia 4-6 times per night.  No excessive drinking.  Pressure but no pain with urination..Urinary symptoms present for 2 months.No hematuria.Complete emptying. Urgency +++. The following portions of the patient's history were reviewed and updated as appropriate: allergies, current medications, past family history.  Review of Systems Pertinent items are noted in HPI.   Objective:    BP 136/82  Temp 98.4 F (36.9 C) (Oral)  Resp 16  Wt 188 lb (85.276 kg)    Weight:  Wt Readings from Last 1 Encounters:  10/31/11 188 lb (85.276 kg)          BMI: There is no height on file to calculate BMI.  General Appearance: Alert, appropriate appearance for age. No acute distress GYN exam: vulva and vagina normal. Cervix normal. No cystocele/rectocele   Assessment:    OAB    Plan:    Trial of Oxytrol. If unsuccessful, will order Lumax     [facility]

## 2011-11-01 ENCOUNTER — Telehealth: Payer: Self-pay

## 2011-11-01 ENCOUNTER — Encounter: Payer: Self-pay | Admitting: Obstetrics and Gynecology

## 2011-11-01 NOTE — Telephone Encounter (Signed)
PC from pt requesting work note for 10-31-11. To be faxed to [phone] per pt request.  Faxed at 9:20 am.  ld

## 2011-11-04 ENCOUNTER — Ambulatory Visit
Admission: RE | Admit: 2011-11-04 | Discharge: 2011-11-04 | Disposition: A | Discharge status: Home / Self Care | Payer: BC Managed Care – PPO | Source: Ambulatory Visit | Attending: Obstetrics and Gynecology | Admitting: Obstetrics and Gynecology

## 2011-11-04 DIAGNOSIS — Z1231 Encounter for screening mammogram for malignant neoplasm of breast: Secondary | ICD-10-CM

## 2011-11-23 ENCOUNTER — Inpatient Hospital Stay (HOSPITAL_COMMUNITY)
Admission: AD | Admit: 2011-11-23 | Discharge: 2011-11-23 | Disposition: A | Discharge status: Home / Self Care | Payer: BC Managed Care – PPO | Source: Ambulatory Visit | Attending: Obstetrics and Gynecology | Admitting: Obstetrics and Gynecology

## 2011-11-23 ENCOUNTER — Inpatient Hospital Stay (HOSPITAL_COMMUNITY): Payer: BC Managed Care – PPO

## 2011-11-23 ENCOUNTER — Encounter (HOSPITAL_COMMUNITY): Payer: Self-pay | Admitting: Obstetrics and Gynecology

## 2011-11-23 DIAGNOSIS — Z9889 Other specified postprocedural states: Secondary | ICD-10-CM

## 2011-11-23 DIAGNOSIS — N949 Unspecified condition associated with female genital organs and menstrual cycle: Secondary | ICD-10-CM

## 2011-11-23 DIAGNOSIS — N938 Other specified abnormal uterine and vaginal bleeding: Secondary | ICD-10-CM

## 2011-11-23 DIAGNOSIS — N898 Other specified noninflammatory disorders of vagina: Secondary | ICD-10-CM | POA: Insufficient documentation

## 2011-11-23 LAB — COMPREHENSIVE METABOLIC PANEL
ALT: 15 U/L (ref 0–35)
AST: 21 U/L (ref 0–37)
Alkaline Phosphatase: 37 U/L — ABNORMAL LOW (ref 39–117)
CO2: 24 mEq/L (ref 19–32)
Chloride: 105 mEq/L (ref 96–112)
GFR calc Af Amer: >90 mL/min (ref >90)
GFR calc non Af Amer: >90 mL/min (ref >90)
Glucose, Bld: 91 mg/dL (ref 70–99)
Potassium: 3.9 mEq/L (ref 3.5–5.1)
Sodium: 139 mEq/L (ref 135–145)

## 2011-11-23 LAB — URINE MICROSCOPIC-ADD ON

## 2011-11-23 LAB — WET PREP, GENITAL: Clue Cells Wet Prep HPF POC: NONE SEEN

## 2011-11-23 LAB — CBC
Hemoglobin: 12.5 g/dL (ref 12.0–15.0)
RBC: 4.21 MIL/uL (ref 3.87–5.11)
WBC: 6 10*3/uL (ref 4.0–10.5)

## 2011-11-23 LAB — URINALYSIS, ROUTINE W REFLEX MICROSCOPIC
Bilirubin Urine: NEGATIVE
Ketones, ur: NEGATIVE mg/dL
Protein, ur: NEGATIVE mg/dL
Urobilinogen, UA: 0.2 mg/dL (ref 0.0–1.0)

## 2011-11-23 NOTE — MAU Note (Signed)
Pt had hysterectomy in 2010. Started having some vaginal bleeding last night and this morning. C/o mild pelvic pain as well.

## 2011-11-23 NOTE — MAU Provider Note (Signed)
History    The patient called today with history of PV bleeding with some red and brown spotting. The patient is post hysterectomy 2010. Her cervix still remains. Presented for evaluation  Of PV bleeding.  CSN: [id number]  Arrival date and time: 11/23/11 4098   First Provider Initiated Contact with Patient 11/23/11 1011      Chief Complaint  Patient presents with  . Vaginal Bleeding   HPI  Pertinent Gynecological History: Menses: flow is light Bleeding: Hysterectomy 2010 and has not had period since this procedure. Contraception: none DES exposure: denies Blood transfusions: none Sexually transmitted diseases: past history of HSV 2 Previous GYN Procedures: hysterecomy for painful multiple uterine fibroids 2010  Last mammogram: normal Date: 2012 Last pap: normal Date: 2012   Past Medical History  Diagnosis Date  . History of syphilis   . Breast fibroadenoma   . History of ectopic pregnancy 2005    r salpyngectomy  . History of conization of cervix 2000  . H/O myomectomy 07-2006    robotic  . History of hysterectomy, supracervical 08-2008    robotic  . PMS (premenstrual syndrome)   . Back pain   . H/O sinusitis   . GERD (gastroesophageal reflux disease)   . Hx of herpes simplex type 2 infection   . History of syphilis   . Abnormal Pap smear 2000    [facility]opsy  . H/O metrorrhagia 12/2006    Past Surgical History  Procedure Date  . Breast surgery 2004    fibroademoma  . Cervical cone biopsy 2000  . Ectopic pregnancy surgery 2005    R salpyngectomy  . Myomectomy 2008    robotic  . Robotic assisted lap vaginal hysterectomy 2010    supracervical  . Polypectomy 2009  . Salpingectomy 2005  . Dilation and curettage of uterus 2009    Family History  Problem Relation Age of Onset  . Hypertension Paternal Grandmother   . Diabetes Paternal Grandmother   . Hypertension Maternal Grandmother   . Sarcoidosis Mother   . Hypertension Mother   . Thyroid disease  Maternal Aunt   . Mental illness Cousin     History  Substance Use Topics  . Smoking status: Current Every Day Smoker -- 0.2 packs/day for 15 years  . Smokeless tobacco: Never Used  . Alcohol Use: 1.5 oz/week    3 drink(s) per week    Allergies:  Allergies  Allergen Reactions  . Keflex (Cephalexin) Anaphylaxis  . Penicillins Anaphylaxis    Prescriptions prior to admission  Medication Sig Dispense Refill  . ibuprofen (ADVIL,MOTRIN) 800 MG tablet Take 800 mg by mouth every 6 (six) hours as needed. Takes for pain        Review of Systems  Constitutional: Negative.   HENT: Negative.   Eyes: Negative.   Respiratory: Negative.   Cardiovascular: Negative.   Gastrointestinal: Negative.   Genitourinary: Positive for urgency.       Has history of urgency in past year. Had been seeing Dr Estanislado Pandy for this condition.  Musculoskeletal: Negative.   Neurological: Negative.   Endo/Heme/Allergies: Negative.   Psychiatric/Behavioral: Negative.    Physical Exam   Blood pressure 158/83, pulse 94, temperature 98.4 F (36.9 C), temperature source Oral, resp. rate 18, height 5\' 4"  (1.626 m), weight 190 lb 3.2 oz (86.274 kg).  Physical Exam  MAU Course  Procedures CBC CMP Wet Prep Pelvic USS  Assessment and Plan   Wet Prep Neg CBC and CMP - WNL Pelvic USS -  normal Collaboration Dr. Stefano Gaul. Possible endometrium tissues from the Cuff that has been hormonally influenced and has caused small bleed.  D/C home and f/u with Dr Estanislado Pandy /Dr Stefano Gaul PRN To take Dirtapan as prescribed by Dr Estanislado Pandy for bladder irritability - see note from Sept 2013   Devonne Lalani, [place], CNM. 11/23/2011, 10:44 AM

## 2011-11-23 NOTE — MAU Note (Signed)
"  I noticed a half dollar sized brown d/c spot in my undewear yesterday at work.  I went the BR this morning and noticed bright red blood in the toilet and on the tissue.  I have had some pelvic pressure."

## 2011-12-15 ENCOUNTER — Telehealth: Payer: Self-pay | Admitting: Obstetrics and Gynecology

## 2011-12-15 NOTE — Telephone Encounter (Signed)
S/s bleeding to report, f./o office call am for appointment. Lavera Guise, CNM

## 2012-01-01 ENCOUNTER — Telehealth: Payer: Self-pay | Admitting: Obstetrics and Gynecology

## 2012-01-01 MED ORDER — SOLIFENACIN SUCCINATE 5 MG PO TABS: 10.0000 mg | ORAL_TABLET | Freq: Every day | ORAL | Status: DC

## 2012-01-01 NOTE — Telephone Encounter (Signed)
Called pt to advise that Rx Vesicare was called into pharm. No answer. VM not set up. Darien Ramus

## 2012-01-01 NOTE — Telephone Encounter (Signed)
Sr to address

## 2012-01-02 ENCOUNTER — Telehealth: Payer: Self-pay | Admitting: Obstetrics and Gynecology

## 2012-01-02 NOTE — Telephone Encounter (Signed)
Advised pt that rx was sent to pharmacy. Any other questions or concerns call office.  Pt voiced understanding. Darien Ramus

## 2012-02-07 ENCOUNTER — Encounter (HOSPITAL_COMMUNITY): Payer: Self-pay | Admitting: Emergency Medicine

## 2012-02-07 ENCOUNTER — Emergency Department (HOSPITAL_COMMUNITY)
Admission: EM | Admit: 2012-02-07 | Discharge: 2012-02-07 | Disposition: A | Discharge status: Home / Self Care | Payer: BC Managed Care – PPO | Attending: Emergency Medicine | Admitting: Emergency Medicine

## 2012-02-07 ENCOUNTER — Emergency Department (HOSPITAL_COMMUNITY): Payer: BC Managed Care – PPO

## 2012-02-07 ENCOUNTER — Other Ambulatory Visit: Payer: Self-pay

## 2012-02-07 DIAGNOSIS — Z8742 Personal history of other diseases of the female genital tract: Secondary | ICD-10-CM | POA: Insufficient documentation

## 2012-02-07 DIAGNOSIS — F17200 Nicotine dependence, unspecified, uncomplicated: Secondary | ICD-10-CM | POA: Insufficient documentation

## 2012-02-07 DIAGNOSIS — R51 Headache: Secondary | ICD-10-CM

## 2012-02-07 DIAGNOSIS — Z79899 Other long term (current) drug therapy: Secondary | ICD-10-CM | POA: Insufficient documentation

## 2012-02-07 DIAGNOSIS — Z8619 Personal history of other infectious and parasitic diseases: Secondary | ICD-10-CM | POA: Insufficient documentation

## 2012-02-07 DIAGNOSIS — Z8709 Personal history of other diseases of the respiratory system: Secondary | ICD-10-CM | POA: Insufficient documentation

## 2012-02-07 DIAGNOSIS — I10 Essential (primary) hypertension: Secondary | ICD-10-CM

## 2012-02-07 DIAGNOSIS — Z8719 Personal history of other diseases of the digestive system: Secondary | ICD-10-CM | POA: Insufficient documentation

## 2012-02-07 LAB — POCT I-STAT, CHEM 8
BUN: 5 mg/dL — ABNORMAL LOW (ref 6–23)
Chloride: 104 mEq/L (ref 96–112)
Creatinine, Ser: 0.7 mg/dL (ref 0.50–1.10)
Potassium: 3.7 mEq/L (ref 3.5–5.1)
Sodium: 141 mEq/L (ref 135–145)

## 2012-02-07 LAB — URINALYSIS, ROUTINE W REFLEX MICROSCOPIC
Bilirubin Urine: NEGATIVE
Nitrite: NEGATIVE
Protein, ur: NEGATIVE mg/dL
Specific Gravity, Urine: 1.012 (ref 1.005–1.030)
Urobilinogen, UA: 0.2 mg/dL (ref 0.0–1.0)

## 2012-02-07 LAB — CBC WITH DIFFERENTIAL/PLATELET
Basophils Absolute: 0 10*3/uL (ref 0.0–0.1)
Basophils Relative: 0 % (ref 0–1)
Eosinophils Absolute: 0.3 10*3/uL (ref 0.0–0.7)
MCH: 30.1 pg (ref 26.0–34.0)
MCHC: 33.7 g/dL (ref 30.0–36.0)
Monocytes Absolute: 0.8 10*3/uL (ref 0.1–1.0)
Neutro Abs: 2.6 10*3/uL (ref 1.7–7.7)
Neutrophils Relative %: 36 % — ABNORMAL LOW (ref 43–77)
RDW: 12.6 % (ref 11.5–15.5)

## 2012-02-07 LAB — URINE MICROSCOPIC-ADD ON

## 2012-02-07 MED ORDER — HYDROCHLOROTHIAZIDE 25 MG PO TABS: 25.0000 mg | ORAL_TABLET | Freq: Every day | ORAL | Status: DC

## 2012-02-07 MED ORDER — HYDROCHLOROTHIAZIDE 25 MG PO TABS
25.0000 mg | ORAL_TABLET | Freq: Once | ORAL | Status: AC
  Administered 2012-02-07: 25 mg via ORAL
  Filled 2012-02-07 (×2): qty 1

## 2012-02-07 NOTE — ED Notes (Signed)
Patient c/o headache x 2 days.  Patient has no h/o hypertension.  Patient was at dentist's office today and got BP checked and it was 126/104.  Her BP was only taken once.  Patient also reports slight dizziness and light headedness.

## 2012-02-07 NOTE — ED Provider Notes (Signed)
History     CSN: [id number]  Arrival date & time 02/07/12  1831   First MD Initiated Contact with Patient 02/07/12 1845      Chief Complaint  Patient presents with  . Headache  . Hypertension    (Consider location/radiation/quality/duration/timing/severity/associated sxs/prior treatment) Patient is a 41 y.o. female presenting with headaches. The history is provided by the patient.  Headache  This is a new (Patient has had a headache for 3 days. She feels that in the frontal region. She went to her dentist office and her blood pressure was found to be high.) problem. The problem occurs constantly (After the dental visit, she went to a drugstore in the blood pressure measured 190/101 at drugstore.). The problem has not changed since onset.The headache is associated with nothing. The pain is located in the bilateral and frontal region. The quality of the pain is described as throbbing. The pain is at a severity of 5/10. The pain is moderate. The pain does not radiate. Pertinent negatives include no fever. Associated symptoms comments: She has a family history of hypertension and diabetes.. She has tried nothing for the symptoms.    Past Medical History  Diagnosis Date  . History of syphilis   . Breast fibroadenoma   . History of ectopic pregnancy 2005    r salpyngectomy  . History of conization of cervix 2000  . H/O myomectomy 07-2006    robotic  . History of hysterectomy, supracervical 08-2008    robotic  . PMS (premenstrual syndrome)   . Back pain   . H/O sinusitis   . GERD (gastroesophageal reflux disease)   . Hx of herpes simplex type 2 infection   . History of syphilis   . Abnormal Pap smear 2000    [facility]  . H/O metrorrhagia 12/2006    Past Surgical History  Procedure Date  . Breast surgery 2004    fibroademoma  . Cervical cone biopsy 2000  . Ectopic pregnancy surgery 2005    R salpyngectomy  . Myomectomy 2008    robotic  . Robotic assisted lap vaginal  hysterectomy 2010    supracervical  . Polypectomy 2009  . Salpingectomy 2005  . Dilation and curettage of uterus 2009    Family History  Problem Relation Age of Onset  . Hypertension Paternal Grandmother   . Diabetes Paternal Grandmother   . Hypertension Maternal Grandmother   . Sarcoidosis Mother   . Hypertension Mother   . Thyroid disease Maternal Aunt   . Mental illness Cousin     History  Substance Use Topics  . Smoking status: Current Every Day Smoker -- 0.2 packs/day for 15 years  . Smokeless tobacco: Never Used  . Alcohol Use: 1.5 oz/week    3 drink(s) per week    OB History    Grav Para Term Preterm Abortions TAB SAB Ect Mult Living   4 2 2  2 1  0 1 0 2      Review of Systems  Constitutional: Negative.  Negative for fever and chills.  Eyes: Negative.   Respiratory: Negative.   Cardiovascular: Negative.   Gastrointestinal: Negative.   Genitourinary: Negative.   Musculoskeletal: Negative.   Skin: Negative.   Neurological: Positive for headaches.  Psychiatric/Behavioral: Negative.     Allergies  Keflex and Penicillins  Home Medications   Current Outpatient Rx  Name  Route  Sig  Dispense  Refill  . IBUPROFEN 800 MG PO TABS   Oral   Take 800  mg by mouth every 6 (six) hours as needed. Takes for pain         . OXYBUTYNIN 3.9 MG/24HR TD PTTW   Transdermal   Place 1 patch onto the skin 2 (two) times a week.           BP 157/96  Pulse 78  Temp 98 F (36.7 C) (Oral)  Resp 20  SpO2 99%  Physical Exam  Nursing note and vitals reviewed. Constitutional: She is oriented to person, place, and time. She appears well-developed and well-nourished.       In mild distress complaining of bifrontal headache.  HENT:  Head: Normocephalic and atraumatic.  Right Ear: External ear normal.  Left Ear: External ear normal.  Mouth/Throat: Oropharynx is clear and moist.  Eyes: Conjunctivae normal and EOM are normal. Pupils are equal, round, and reactive to  light. No scleral icterus.  Neck: Normal range of motion. Neck supple.  Cardiovascular: Normal rate, regular rhythm and normal heart sounds.   Pulmonary/Chest: Effort normal and breath sounds normal.  Abdominal: Soft. Bowel sounds are normal.  Musculoskeletal: Normal range of motion. She exhibits no edema and no tenderness.  Neurological: She is alert and oriented to person, place, and time.       No sensory or motor deficits.  Skin: Skin is warm and dry.  Psychiatric: She has a normal mood and affect. Her behavior is normal.    ED Course  Procedures (including critical care time)   Labs Reviewed  CBC WITH DIFFERENTIAL  URINALYSIS, ROUTINE W REFLEX MICROSCOPIC   7:17 PM Patient was seen and had physical examination. Laboratory tests, EKG, and CT of the brain were ordered. Hydrochlorothiazide 25 mg orally was ordered.  7:25 PM  Date: 02/07/2012  Rate: 81  Rhythm: normal sinus rhythm  QRS Axis: normal  Intervals: normal PQRS:  Left atrial abnormality  ST/T Wave abnormalities: normal  Conduction Disutrbances:none  Narrative Interpretation: Borderline EKG  Old EKG Reviewed: none available  10:12 PM Results for orders placed during the hospital encounter of 02/07/12  CBC WITH DIFFERENTIAL      Component Value Range   WBC 7.3  4.0 - 10.5 K/uL   RBC 4.42  3.87 - 5.11 MIL/uL   Hemoglobin 13.3  12.0 - 15.0 g/dL   HCT 16.1  09.6 - 04.5 %   MCV 89.4  78.0 - 100.0 fL   MCH 30.1  26.0 - 34.0 pg   MCHC 33.7  30.0 - 36.0 g/dL   RDW 40.9  81.1 - 91.4 %   Platelets 329  150 - 400 K/uL   Neutrophils Relative 36 (*) 43 - 77 %   Neutro Abs 2.6  1.7 - 7.7 K/uL   Lymphocytes Relative 48 (*) 12 - 46 %   Lymphs Abs 3.5  0.7 - 4.0 K/uL   Monocytes Relative 12  3 - 12 %   Monocytes Absolute 0.8  0.1 - 1.0 K/uL   Eosinophils Relative 4  0 - 5 %   Eosinophils Absolute 0.3  0.0 - 0.7 K/uL   Basophils Relative 0  0 - 1 %   Basophils Absolute 0.0  0.0 - 0.1 K/uL  URINALYSIS, ROUTINE W  REFLEX MICROSCOPIC      Component Value Range   Color, Urine YELLOW  YELLOW   APPearance CLOUDY (*) CLEAR   Specific Gravity, Urine 1.012  1.005 - 1.030   pH 7.0  5.0 - 8.0   Glucose, UA NEGATIVE  NEGATIVE mg/dL  Hgb urine dipstick NEGATIVE  NEGATIVE   Bilirubin Urine NEGATIVE  NEGATIVE   Ketones, ur NEGATIVE  NEGATIVE mg/dL   Protein, ur NEGATIVE  NEGATIVE mg/dL   Urobilinogen, UA 0.2  0.0 - 1.0 mg/dL   Nitrite NEGATIVE  NEGATIVE   Leukocytes, UA TRACE (*) NEGATIVE  POCT I-STAT, CHEM 8      Component Value Range   Sodium 141  135 - 145 mEq/L   Potassium 3.7  3.5 - 5.1 mEq/L   Chloride 104  96 - 112 mEq/L   BUN 5 (*) 6 - 23 mg/dL   Creatinine, Ser 4.78  0.50 - 1.10 mg/dL   Glucose, Bld 295 (*) 70 - 99 mg/dL   Calcium, Ion 6.21  3.08 - 1.23 mmol/L   TCO2 26  0 - 100 mmol/L   Hemoglobin 13.9  12.0 - 15.0 g/dL   HCT 65.7  84.6 - 96.2 %  URINE MICROSCOPIC-ADD ON      Component Value Range   Squamous Epithelial / LPF FEW (*) RARE   WBC, UA 0-2  <3 WBC/hpf   Bacteria, UA FEW (*) RARE   Ct Head Wo Contrast  02/07/2012  *RADIOLOGY REPORT*  Clinical Data: Hypertension.  Frontal headache.  Dizziness.  CT HEAD WITHOUT CONTRAST  Technique:  Contiguous axial images were obtained from the base of the skull through the vertex without contrast.  Comparison: None.  Findings: No acute intracranial abnormality is present. Specifically, there is no evidence for acute infarct, hemorrhage, mass, hydrocephalus, or extra-axial fluid collection.  The paranasal sinuses and mastoid air cells are clear.  The globes and orbits are intact.  The osseous skull is intact.  IMPRESSION: Negative CT of the head.   Original Report Authenticated By: Marin Roberts, M.D.     Lab workup was negative.  Rx with HCTZ 25 mg qAM, followup with Dr. Clent Ridges in the office.     1. Headache   2. Hypertension        Carleene Cooper III, MD 02/07/12 2216

## 2012-02-07 NOTE — ED Notes (Signed)
Pt states BP was elevated at dentist yesterday and today at machine in grocery store; pt became concerned and came to have it evaluated; pt denies chest pain or any other complaints other than dull headache.

## 2012-03-25 ENCOUNTER — Telehealth: Payer: Self-pay | Admitting: Obstetrics and Gynecology

## 2012-03-25 NOTE — Telephone Encounter (Signed)
TC from pt. States had bright red vaginal bleeding 03/25/11 and brown D/C today.  Some lower back pain and  abd cramping. No recent IC. Scheduled for eval with DR SR 03/26/12.

## 2012-03-25 NOTE — Telephone Encounter (Signed)
Returned pt's call. LM to return call.   

## 2012-03-26 ENCOUNTER — Encounter: Payer: BC Managed Care – PPO | Admitting: Obstetrics and Gynecology

## 2012-07-15 ENCOUNTER — Encounter (HOSPITAL_COMMUNITY): Payer: Self-pay | Admitting: *Deleted

## 2012-07-15 ENCOUNTER — Emergency Department (HOSPITAL_COMMUNITY)
Admission: EM | Admit: 2012-07-15 | Discharge: 2012-07-15 | Disposition: A | Discharge status: Home / Self Care | Payer: BC Managed Care – PPO | Attending: Emergency Medicine | Admitting: Emergency Medicine

## 2012-07-15 ENCOUNTER — Emergency Department (HOSPITAL_COMMUNITY): Payer: BC Managed Care – PPO

## 2012-07-15 DIAGNOSIS — Z8742 Personal history of other diseases of the female genital tract: Secondary | ICD-10-CM | POA: Insufficient documentation

## 2012-07-15 DIAGNOSIS — Z8619 Personal history of other infectious and parasitic diseases: Secondary | ICD-10-CM | POA: Insufficient documentation

## 2012-07-15 DIAGNOSIS — Z880 Allergy status to penicillin: Secondary | ICD-10-CM | POA: Insufficient documentation

## 2012-07-15 DIAGNOSIS — M5430 Sciatica, unspecified side: Secondary | ICD-10-CM | POA: Insufficient documentation

## 2012-07-15 DIAGNOSIS — Z8709 Personal history of other diseases of the respiratory system: Secondary | ICD-10-CM | POA: Insufficient documentation

## 2012-07-15 DIAGNOSIS — M5432 Sciatica, left side: Secondary | ICD-10-CM

## 2012-07-15 DIAGNOSIS — M5380 Other specified dorsopathies, site unspecified: Secondary | ICD-10-CM | POA: Insufficient documentation

## 2012-07-15 DIAGNOSIS — Z8719 Personal history of other diseases of the digestive system: Secondary | ICD-10-CM | POA: Insufficient documentation

## 2012-07-15 DIAGNOSIS — F17200 Nicotine dependence, unspecified, uncomplicated: Secondary | ICD-10-CM | POA: Insufficient documentation

## 2012-07-15 DIAGNOSIS — Z79899 Other long term (current) drug therapy: Secondary | ICD-10-CM | POA: Insufficient documentation

## 2012-07-15 MED ORDER — HYDROCODONE-ACETAMINOPHEN 5-325 MG PO TABS: 1.0000 | ORAL_TABLET | Freq: Four times a day (QID) | ORAL | Status: DC | PRN

## 2012-07-15 MED ORDER — HYDROCODONE-ACETAMINOPHEN 5-325 MG PO TABS
1.0000 | ORAL_TABLET | Freq: Once | ORAL | Status: AC
  Administered 2012-07-15: 1 via ORAL
  Filled 2012-07-15: qty 1

## 2012-07-15 MED ORDER — PREDNISONE 50 MG PO TABS: ORAL_TABLET | ORAL | Status: DC

## 2012-07-15 NOTE — ED Notes (Signed)
Pt states woke up this morning with severe left hip pain; no obvious injury

## 2012-07-15 NOTE — ED Provider Notes (Signed)
History    CSN: [id number] Arrival date & time 07/15/12  7829 First MD Initiated Contact with Patient 07/15/12 [phone]      Chief Complaint  Patient presents with  . Hip Pain    HPI Patient presents to emergency room with complaints of left hip and thigh pain. Patient states she does not recall any specific injuries. She has been more active playing badminton with her child over the last few days. The pain this morning is in her left lower back radiating down her left thigh a posterior portion of her left leg down to her foot. It is a sharp and aching pain. Movement and sitting up increases the pain. She denies any numbness or weakness. Has no difficulty urinating. She has no fever. She has not had any trouble with abdominal pain.  Past Medical History  Diagnosis Date  . History of syphilis   . Breast fibroadenoma   . History of ectopic pregnancy 2005    r salpyngectomy  . History of conization of cervix 2000  . H/O myomectomy 07-2006    robotic  . History of hysterectomy, supracervical 08-2008    robotic  . PMS (premenstrual syndrome)   . Back pain   . H/O sinusitis   . GERD (gastroesophageal reflux disease)   . Hx of herpes simplex type 2 infection   . History of syphilis   . Abnormal Pap smear 2000    [facility] Bi[facility]  . H/O metrorrhagia 12/2006    Past Surgical History  Procedure Laterality Date  . Breast surgery  2004    fibroademoma  . Cervical cone biopsy  2000  . Ectopic pregnancy surgery  2005    R salpyngectomy  . Myomectomy  2008    robotic  . Robotic assisted lap vaginal hysterectomy  2010    supracervical  . Polypectomy  2009  . Salpingectomy  2005  . Dilation and curettage of uterus  2009    Family History  Problem Relation Age of Onset  . Hypertension Paternal Grandmother   . Diabetes Paternal Grandmother   . Hypertension Maternal Grandmother   . Sarcoidosis Mother   . Hypertension Mother   . Thyroid disease Maternal Aunt   . Mental illness Cousin      History  Substance Use Topics  . Smoking status: Current Every Day Smoker -- 0.25 packs/day for 15 years  . Smokeless tobacco: Never Used  . Alcohol Use: 1.5 oz/week    3 drink(s) per week    OB History   Grav Para Term Preterm Abortions TAB SAB Ect Mult Living   4 2 2  2 1  0 1 0 2      Review of Systems  All other systems reviewed and are negative.    Allergies  Keflex and Penicillins  Home Medications   Current Outpatient Rx  Name  Route  Sig  Dispense  Refill  . Black Cohosh (REMIFEMIN MENOPAUSE PO)   Oral   Take 2 tablets by mouth daily.         . calcium citrate-vitamin D (CITRACAL+D) 315-200 MG-UNIT per tablet   Oral   Take 1 tablet by mouth daily.         . Cranberry 250 MG TABS   Oral   Take 2 tablets by mouth daily.         Marland Kitchen ibuprofen (ADVIL,MOTRIN) 200 MG tablet   Oral   Take 600 mg by mouth every 6 (six) hours as needed for pain.         Marland Kitchen  lisinopril (PRINIVIL,ZESTRIL) 10 MG tablet   Oral   Take 10 mg by mouth daily.         Marland Kitchen OVER THE COUNTER MEDICATION   Oral   Take 2 tablets by mouth daily. Hair skin & nails multivitamin with biotin         . HYDROcodone-acetaminophen (NORCO/VICODIN) 5-325 MG per tablet   Oral   Take 1 tablet by mouth every 6 (six) hours as needed for pain (Take 1 - 2 tablets every 4 - 6 hours.).   20 tablet   0   . predniSONE (DELTASONE) 50 MG tablet      Take 1 a day for 7 days.   7 tablet   0     BP 111/74  Pulse 83  Temp(Src) 98.4 F (36.9 C)  Resp 20  SpO2 98%  Physical Exam  Nursing note and vitals reviewed. Constitutional: She appears well-developed and well-nourished.  HENT:  Head: Normocephalic and atraumatic.  Right Ear: External ear normal.  Left Ear: External ear normal.  Nose: Nose normal.  Eyes: Conjunctivae and EOM are normal.  Neck: Neck supple. No tracheal deviation present.  Pulmonary/Chest: Effort normal. No stridor. No respiratory distress.  Musculoskeletal: She  exhibits no edema and no tenderness.       Lumbar back: She exhibits decreased range of motion, tenderness, pain and spasm. She exhibits no swelling and no edema.  Neurological: She is alert. She is not disoriented. No cranial nerve deficit or sensory deficit. She exhibits normal muscle tone. Coordination normal.  Reflex Scores:      Patellar reflexes are 2+ on the right side and 2+ on the left side.      Achilles reflexes are 2+ on the right side and 2+ on the left side. Skin: Skin is warm and dry. No rash noted. She is not diaphoretic. No erythema.  Psychiatric: She has a normal mood and affect. Her behavior is normal. Thought content normal.    ED Course  Procedures (including critical care time)  Labs Reviewed - No data to display Dg Hip Complete Left  07/15/2012   *RADIOLOGY REPORT*  Clinical Data: New onset of left hip pain, radiating to the left ankle.  LEFT HIP - COMPLETE 2+ VIEW  Comparison: None.  Findings: There is no evidence of fracture or dislocation.  Both femoral heads are seated normally within their respective acetabula.  The proximal left femur appears intact.  No significant degenerative change is appreciated.  The sacroiliac joints are unremarkable in appearance.  The visualized bowel gas pattern is grossly unremarkable in appearance.  IMPRESSION: No evidence of fracture or dislocation.   Original Report Authenticated By: Tonia Ghent, M.D.     1. Sciatica, left   2. Sciatica, left [724.3]       MDM  No sign of acute neurological or vascular emergency associated with pt's back pain.  May have a component of sciatica.  Safe for outpatient follow up.         Celene Kras, MD 07/15/12 [phone]

## 2012-09-18 ENCOUNTER — Other Ambulatory Visit: Payer: Self-pay | Admitting: Orthopedic Surgery

## 2012-09-22 ENCOUNTER — Encounter (HOSPITAL_BASED_OUTPATIENT_CLINIC_OR_DEPARTMENT_OTHER): Payer: Self-pay | Admitting: *Deleted

## 2012-09-22 NOTE — Progress Notes (Signed)
Had ekg 12/13-no labs needed per anesth if not on diuretic-no cardiac or resp problems

## 2012-09-28 ENCOUNTER — Ambulatory Visit (HOSPITAL_BASED_OUTPATIENT_CLINIC_OR_DEPARTMENT_OTHER): Payer: Worker's Compensation | Admitting: Certified Registered Nurse Anesthetist

## 2012-09-28 ENCOUNTER — Ambulatory Visit (HOSPITAL_BASED_OUTPATIENT_CLINIC_OR_DEPARTMENT_OTHER)
Admission: RE | Admit: 2012-09-28 | Discharge: 2012-09-28 | Disposition: A | Discharge status: Home / Self Care | Payer: Worker's Compensation | Source: Ambulatory Visit | Attending: Orthopedic Surgery | Admitting: Orthopedic Surgery

## 2012-09-28 ENCOUNTER — Encounter (HOSPITAL_BASED_OUTPATIENT_CLINIC_OR_DEPARTMENT_OTHER): Payer: Self-pay | Admitting: Certified Registered Nurse Anesthetist

## 2012-09-28 ENCOUNTER — Encounter (HOSPITAL_BASED_OUTPATIENT_CLINIC_OR_DEPARTMENT_OTHER): Payer: Self-pay

## 2012-09-28 ENCOUNTER — Encounter (HOSPITAL_BASED_OUTPATIENT_CLINIC_OR_DEPARTMENT_OTHER)
Admission: RE | Disposition: A | Discharge status: Home / Self Care | Payer: Self-pay | Source: Ambulatory Visit | Attending: Orthopedic Surgery

## 2012-09-28 DIAGNOSIS — M6530 Trigger finger, unspecified finger: Secondary | ICD-10-CM | POA: Insufficient documentation

## 2012-09-28 DIAGNOSIS — K219 Gastro-esophageal reflux disease without esophagitis: Secondary | ICD-10-CM | POA: Insufficient documentation

## 2012-09-28 DIAGNOSIS — Z880 Allergy status to penicillin: Secondary | ICD-10-CM | POA: Insufficient documentation

## 2012-09-28 DIAGNOSIS — I10 Essential (primary) hypertension: Secondary | ICD-10-CM | POA: Insufficient documentation

## 2012-09-28 DIAGNOSIS — Z881 Allergy status to other antibiotic agents status: Secondary | ICD-10-CM | POA: Insufficient documentation

## 2012-09-28 HISTORY — PX: TRIGGER FINGER RELEASE: SHX641

## 2012-09-28 LAB — POCT HEMOGLOBIN-HEMACUE: Hemoglobin: 12.3 g/dL (ref 12.0–15.0)

## 2012-09-28 SURGERY — RELEASE, A1 PULLEY, FOR TRIGGER FINGER
Anesthesia: Monitor Anesthesia Care | Site: Thumb | Laterality: Right | Wound class: Clean

## 2012-09-28 MED ORDER — LIDOCAINE HCL 2 % IJ SOLN
INTRAMUSCULAR | Status: DC | PRN
  Administered 2012-09-28: 1.5 mL

## 2012-09-28 MED ORDER — OXYCODONE HCL 5 MG PO TABS
5.0000 mg | ORAL_TABLET | Freq: Once | ORAL | Status: AC | PRN
  Administered 2012-09-28: 5 mg via ORAL

## 2012-09-28 MED ORDER — HYDROMORPHONE HCL PF 1 MG/ML IJ SOLN: 0.5000 mg | INTRAMUSCULAR | Status: DC | PRN

## 2012-09-28 MED ORDER — FENTANYL CITRATE 0.05 MG/ML IJ SOLN
25.0000 ug | INTRAMUSCULAR | Status: DC | PRN
  Administered 2012-09-28: 25 ug via INTRAVENOUS

## 2012-09-28 MED ORDER — PROPOFOL INFUSION 10 MG/ML OPTIME
INTRAVENOUS | Status: DC | PRN
  Administered 2012-09-28: 75 ug/kg/min via INTRAVENOUS

## 2012-09-28 MED ORDER — METOCLOPRAMIDE HCL 5 MG/ML IJ SOLN: 10.0000 mg | Freq: Once | INTRAMUSCULAR | Status: DC | PRN

## 2012-09-28 MED ORDER — OXYCODONE HCL 5 MG/5ML PO SOLN: 5.0000 mg | Freq: Once | ORAL | Status: AC | PRN

## 2012-09-28 MED ORDER — ONDANSETRON HCL 4 MG/2ML IJ SOLN
INTRAMUSCULAR | Status: DC | PRN
  Administered 2012-09-28: 4 mg via INTRAVENOUS

## 2012-09-28 MED ORDER — BUPIVACAINE-EPINEPHRINE 0.5% -1:200000 IJ SOLN
INTRAMUSCULAR | Status: DC | PRN
  Administered 2012-09-28: 1.5 mL

## 2012-09-28 MED ORDER — LACTATED RINGERS IV SOLN
INTRAVENOUS | Status: DC
  Administered 2012-09-28: 08:00:00 via INTRAVENOUS

## 2012-09-28 MED ORDER — FENTANYL CITRATE 0.05 MG/ML IJ SOLN
INTRAMUSCULAR | Status: DC | PRN
  Administered 2012-09-28: 50 ug via INTRAVENOUS

## 2012-09-28 MED ORDER — HYDROCODONE-ACETAMINOPHEN 5-325 MG PO TABS: 1.0000 | ORAL_TABLET | ORAL | Status: DC | PRN

## 2012-09-28 MED ORDER — OXYCODONE-ACETAMINOPHEN 5-325 MG PO TABS: 1.0000 | ORAL_TABLET | ORAL | Status: DC | PRN

## 2012-09-28 MED ORDER — VANCOMYCIN HCL IN DEXTROSE 1-5 GM/200ML-% IV SOLN
1000.0000 mg | INTRAVENOUS | Status: AC
  Administered 2012-09-28: 1000 mg via INTRAVENOUS

## 2012-09-28 MED ORDER — FENTANYL CITRATE 0.05 MG/ML IJ SOLN: 50.0000 ug | INTRAMUSCULAR | Status: DC | PRN

## 2012-09-28 MED ORDER — HYDROCODONE-ACETAMINOPHEN 5-325 MG PO TABS
1.0000 | ORAL_TABLET | ORAL | Status: DC | PRN
Start: 2012-09-28 — End: 2012-09-28

## 2012-09-28 MED ORDER — MIDAZOLAM HCL 2 MG/2ML IJ SOLN: 1.0000 mg | INTRAMUSCULAR | Status: DC | PRN

## 2012-09-28 MED ORDER — LACTATED RINGERS IV SOLN: INTRAVENOUS | Status: DC

## 2012-09-28 MED ORDER — LIDOCAINE HCL (CARDIAC) 20 MG/ML IV SOLN
INTRAVENOUS | Status: DC | PRN
  Administered 2012-09-28: 60 mg via INTRAVENOUS

## 2012-09-28 MED ORDER — MIDAZOLAM HCL 5 MG/5ML IJ SOLN
INTRAMUSCULAR | Status: DC | PRN
  Administered 2012-09-28: 2 mg via INTRAVENOUS

## 2012-09-28 SURGICAL SUPPLY — 36 items
BANDAGE COBAN STERILE 2 (GAUZE/BANDAGES/DRESSINGS) IMPLANT
BANDAGE CONFORM 2  STR LF (GAUZE/BANDAGES/DRESSINGS) ×2 IMPLANT
BANDAGE GAUZE ELAST BULKY 4 IN (GAUZE/BANDAGES/DRESSINGS) IMPLANT
BLADE MINI RND TIP GREEN BEAV (BLADE) IMPLANT
BLADE SURG 15 STRL LF DISP TIS (BLADE) ×1 IMPLANT
BLADE SURG 15 STRL SS (BLADE) ×2
BNDG CMPR 9X4 STRL LF SNTH (GAUZE/BANDAGES/DRESSINGS)
BNDG COHESIVE 1X5 TAN STRL LF (GAUZE/BANDAGES/DRESSINGS) ×2 IMPLANT
BNDG COHESIVE 4X5 TAN STRL (GAUZE/BANDAGES/DRESSINGS) IMPLANT
BNDG ESMARK 4X9 LF (GAUZE/BANDAGES/DRESSINGS) IMPLANT
CHLORAPREP W/TINT 26ML (MISCELLANEOUS) ×2 IMPLANT
COVER MAYO STAND STRL (DRAPES) ×2 IMPLANT
COVER TABLE BACK 60X90 (DRAPES) ×2 IMPLANT
CUFF TOURNIQUET SINGLE 18IN (TOURNIQUET CUFF) IMPLANT
DRAPE EXTREMITY T 121X128X90 (DRAPE) ×2 IMPLANT
DRAPE SURG 17X23 STRL (DRAPES) ×2 IMPLANT
DRSG EMULSION OIL 3X3 NADH (GAUZE/BANDAGES/DRESSINGS) ×2 IMPLANT
GAUZE SPONGE 4X4 12PLY STRL LF (GAUZE/BANDAGES/DRESSINGS) ×2 IMPLANT
GLOVE BIO SURGEON STRL SZ7.5 (GLOVE) ×2 IMPLANT
GLOVE BIOGEL PI IND STRL 8 (GLOVE) ×1 IMPLANT
GLOVE BIOGEL PI INDICATOR 8 (GLOVE) ×1
GOWN PREVENTION PLUS XLARGE (GOWN DISPOSABLE) ×2 IMPLANT
NDL SAFETY ECLIPSE 18X1.5 (NEEDLE) IMPLANT
NEEDLE HYPO 18GX1.5 SHARP (NEEDLE)
NEEDLE HYPO 25X1 1.5 SAFETY (NEEDLE) IMPLANT
NS IRRIG 1000ML POUR BTL (IV SOLUTION) ×2 IMPLANT
PACK BASIN DAY SURGERY FS (CUSTOM PROCEDURE TRAY) ×2 IMPLANT
PADDING CAST ABS 4INX4YD NS (CAST SUPPLIES)
PADDING CAST ABS COTTON 4X4 ST (CAST SUPPLIES) IMPLANT
STOCKINETTE 4X48 STRL (DRAPES) ×2 IMPLANT
SUT VICRYL RAPIDE 4/0 PS 2 (SUTURE) IMPLANT
SYR BULB 3OZ (MISCELLANEOUS) ×2 IMPLANT
SYRINGE 10CC LL (SYRINGE) IMPLANT
TOWEL OR 17X24 6PK STRL BLUE (TOWEL DISPOSABLE) ×2 IMPLANT
TOWEL OR NON WOVEN STRL DISP B (DISPOSABLE) ×2 IMPLANT
UNDERPAD 30X30 INCONTINENT (UNDERPADS AND DIAPERS) ×2 IMPLANT

## 2012-09-28 NOTE — Anesthesia Procedure Notes (Signed)
Procedure Name: MAC Date/Time: 09/28/2012 8:39 AM Performed by: Sutton Plake D Pre-anesthesia Checklist: Patient identified, Emergency Drugs available, Suction available, Patient being monitored and Timeout performed Patient Re-evaluated:Patient Re-evaluated prior to inductionOxygen Delivery Method: Simple face mask

## 2012-09-28 NOTE — H&P (Signed)
[name] is an 42 y.o. female.   CC / Reason for Visit: Right thumb pain HPI: This patient returns for reevaluation having undergone MRI scan of her right wrist.  This fails to reveal any soft tissue enlargement or neoplasm.  She is relieved today to know that the perceived enlargement represents nothing ominous.  She confirms that the thumb continues to lock every morning and that the prior injection was helpful although temporarily so.  She takes ibuprofen 800 mg 3 times a day.  She also has some pain that may be more on the ulnar side of the thumb at the MP level.  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  She has pain on the volar surface of the right thumb, hurts with activities such as writing or evening keyboard use.  She thinks the right distal forearm may be a little swollen.  She is unable to take NSAIDs due to hypertension.  She denies any overt locking.    Past Medical History  Diagnosis Date  . History of syphilis   . Breast fibroadenoma   . History of ectopic pregnancy 2005    r salpyngectomy  . History of conization of cervix 2000  . H/O myomectomy 07-2006    robotic  . History of hysterectomy, supracervical 08-2008    robotic  . PMS (premenstrual syndrome)   . Back pain   . H/O sinusitis   . GERD (gastroesophageal reflux disease)   . Hx of herpes simplex type 2 infection   . History of syphilis   . Abnormal Pap smear 2000    [facility]  . H/O metrorrhagia 12/2006    Past Surgical History  Procedure Laterality Date  . Cervical cone biopsy  2000  . Ectopic pregnancy surgery  2005    R salpyngectomy  . Myomectomy  2008    robotic  . Robotic assisted lap vaginal hysterectomy  2010    supracervical   . Polypectomy  2009  . Salpingectomy  2005  . Dilation and curettage of uterus  2009  . Abdominal hysterectomy    . Breast surgery  2004    fibroademoma  . Breast lumpectomy  10/11    rt-neg    Family History  Problem Relation Age of Onset  . Hypertension Paternal Grandmother   . Diabetes Paternal Grandmother   . Hypertension Maternal Grandmother   . Sarcoidosis Mother   . Hypertension Mother   . Thyroid disease Maternal Aunt   . Mental illness Cousin    Social History:  reports that she has been smoking.  She has never used smokeless tobacco. She reports that she drinks about 1.5 ounces of alcohol per week. She reports that she does not use illicit drugs.  Allergies:  Allergies  Allergen Reactions  . Keflex (Cephalexin) Anaphylaxis  . Penicillins Anaphylaxis    No prescriptions prior to admission    No results found for this or any previous visit (from the past 48 hour(s)). No results found.  Review of Systems  All other systems reviewed and are negative.    Height 5\' 4"  (1.626 m), weight 86.183 kg (190 lb). Physical Exam  Constitutional:  WD, WN, NAD HEENT:  NCAT, EOMI Neuro/Psych:  Alert & oriented  to person, place, and time; appropriate mood & affect Lymphatic: No generalized UE edema or lymphadenopathy Extremities / MSK:  Both UE are normal with respect to appearance, ranges of motion, joint stability, muscle strength/tone, sensation, & perfusion except as otherwise noted:  Right thumb tender volarly just slightly proximal to the proximal edge of the A1 pulley.  She reports pain on the ulnar side of the MP joint, but not with palpation there.  She has pain there with TMC stress and grind testing.  With vigorous stress testing she reports pain there as well as pain with palpation at the level of the volar TMC joint.  Labs / Xrays:  No radiographic studies obtained today.  Assessment: Right thumb stenosing tenosynovitis--might possibly have some pain  referable to the [facility] joint as well  Plan:  I discussed this entity with the patient.  She wishes to proceed with surgical release of the right trigger thumb.  The details of the surgery were reviewed with her, consent obtained, and we will post this hopefully for next week pending Worker's Compensation authorization.  We will need to see to what extent the trigger release alleviates all of her thumb symptoms.  [name] A. 09/28/2012, 6:18 AM

## 2012-09-28 NOTE — Interval H&P Note (Signed)
History and Physical Interval Note:  09/28/2012 7:47 AM  [name]  has presented today for surgery, with the diagnosis of RIGHT TRIGGER THUMB  The various methods of treatment have been discussed with the patient and family. After consideration of risks, benefits and other options for treatment, the patient has consented to  Procedure(s): RELEASE TRIGGER FINGER/A-1 PULLEY RIGHT THUMB (Right) as a surgical intervention .  The patient's history has been reviewed, patient examined, no change in status, stable for surgery.  I have reviewed the patient's chart and labs.  Questions were answered to the patient's satisfaction.     Waleed Dettman A.

## 2012-09-28 NOTE — Op Note (Signed)
09/28/2012  8:46 AM  PATIENT:  [name]  42 y.o. female  PRE-OPERATIVE DIAGNOSIS:  Right trigger thumb  POST-OPERATIVE DIAGNOSIS:  Same  PROCEDURE:  Right trigger thumb release  SURGEON: Cliffton Asters. Janee Morn, MD  PHYSICIAN ASSISTANT: None  ANESTHESIA:  local and MAC  SPECIMENS:  None  DRAINS:   None  PREOPERATIVE INDICATIONS:  [name] is a  42 y.o. female with a diagnosis of right trigger thumb who failed conservative measures and elected for surgical management.    The risks benefits and alternatives were discussed with the patient preoperatively including but not limited to the risks of infection, bleeding, nerve injury, cardiopulmonary complications, the need for revision surgery, among others, and the patient verbalized understanding and consented to proceed.  OPERATIVE IMPLANTS: None  OPERATIVE FINDINGS: FPL with longitudinal split tearing that was debrided  OPERATIVE PROCEDURE:  After receiving prophylactic antibiotics, the patient was escorted to the operative theatre and placed in a supine position.  A surgical "time-out" was performed during which the planned procedure, proposed operative site, and the correct patient identity were compared to the operative consent and agreement confirmed by the circulating nurse according to current facility policy.  The surgical incision was marked and anesthetized with a mixture of lidocaine and Marcaine bearing epinephrine. Following application of a tourniquet to the operative extremity, the exposed skin was prepped with Chloraprep and draped in the usual sterile fashion.  The limb was exsanguinated with an Esmarch bandage and the tourniquet inflated to approximately higher than systolic BP.  A transverse incision was made at the base of the thumb with care to protect and preserve the underlying neurovascular structures. These were retracted radially and ulnarly exposing the A1 pulley. This was incised down the  midline with care to preserve the oblique pulley. The tendon was identified and pulled out into the wound where longitudinal split tearing was identified and this was debrided. The tourniquet was released the tendon returned to its bed and the wound irrigated before closure with 4-0 Vicryl Rapide interrupted sutures. A dressing was applied and she was taken to the recovery room in stable condition  DISPOSITION: Patient will be discharged home with typical instructions returning in 10-15 days.

## 2012-09-28 NOTE — Anesthesia Preprocedure Evaluation (Addendum)
Anesthesia Evaluation  Patient identified by MRN, date of birth, ID band Patient awake    Reviewed: Allergy & Precautions, H&P , NPO status , Patient's Chart, lab work & pertinent test results, reviewed documented beta blocker date and time   Airway Mallampati: II TM Distance: >3 FB Neck ROM: full    Dental   Pulmonary neg pulmonary ROS, Current Smoker,  breath sounds clear to auscultation        Cardiovascular negative cardio ROS  Rhythm:regular     Neuro/Psych PSYCHIATRIC DISORDERS Depression negative neurological ROS     GI/Hepatic Neg liver ROS, GERD-  Medicated,  Endo/Other  negative endocrine ROS  Renal/GU negative Renal ROS  negative genitourinary   Musculoskeletal   Abdominal   Peds  Hematology negative hematology ROS (+)   Anesthesia Other Findings See surgeon's H&P   Reproductive/Obstetrics negative OB ROS                           Anesthesia Physical Anesthesia Plan  ASA: II  Anesthesia Plan: MAC   Post-op Pain Management:    Induction: Intravenous  Airway Management Planned: Simple Face Mask  Additional Equipment:   Intra-op Plan:   Post-operative Plan:   Informed Consent: I have reviewed the patients History and Physical, chart, labs and discussed the procedure including the risks, benefits and alternatives for the proposed anesthesia with the patient or authorized representative who has indicated his/her understanding and acceptance.   Dental Advisory Given  Plan Discussed with: CRNA and Surgeon  Anesthesia Plan Comments:         Anesthesia Quick Evaluation

## 2012-09-28 NOTE — Transfer of Care (Signed)
Immediate Anesthesia Transfer of Care Note  Patient: [name]  Procedure(s) Performed: Procedure(s): RELEASE TRIGGER FINGER/A-1 PULLEY RIGHT THUMB (Right)  Patient Location: PACU  Anesthesia Type:MAC  Level of Consciousness: awake and patient cooperative  Airway & Oxygen Therapy: Patient Spontanous Breathing and Patient connected to face mask oxygen  Post-op Assessment: Report given to PACU RN and Post -op Vital signs reviewed and stable  Post vital signs: Reviewed and stable  Complications: No apparent anesthesia complications

## 2012-09-28 NOTE — Anesthesia Postprocedure Evaluation (Signed)
Anesthesia Post Note  Patient: [name]  Procedure(s) Performed: Procedure(s) (LRB): RELEASE TRIGGER FINGER/A-1 PULLEY RIGHT THUMB (Right)  Anesthesia type: MAC  Patient location: PACU  Post pain: Pain level controlled  Post assessment: Patient's Cardiovascular Status Stable  Last Vitals:  Filed Vitals:   09/28/12 0930  BP: 135/82  Pulse: 81  Temp:   Resp: 13    Post vital signs: Reviewed and stable  Level of consciousness: alert  Complications: No apparent anesthesia complications

## 2012-09-29 ENCOUNTER — Encounter (HOSPITAL_BASED_OUTPATIENT_CLINIC_OR_DEPARTMENT_OTHER): Payer: Self-pay | Admitting: Orthopedic Surgery

## 2012-11-09 ENCOUNTER — Other Ambulatory Visit: Payer: Self-pay

## 2012-11-09 DIAGNOSIS — Z1231 Encounter for screening mammogram for malignant neoplasm of breast: Secondary | ICD-10-CM

## 2012-11-17 ENCOUNTER — Ambulatory Visit
Admission: RE | Admit: 2012-11-17 | Discharge: 2012-11-17 | Disposition: A | Discharge status: Home / Self Care | Payer: BC Managed Care – PPO | Source: Ambulatory Visit

## 2012-11-17 DIAGNOSIS — Z1231 Encounter for screening mammogram for malignant neoplasm of breast: Secondary | ICD-10-CM

## 2012-11-29 ENCOUNTER — Emergency Department (HOSPITAL_COMMUNITY)
Admission: EM | Admit: 2012-11-29 | Discharge: 2012-11-29 | Disposition: A | Discharge status: Home / Self Care | Payer: BC Managed Care – PPO | Attending: Emergency Medicine | Admitting: Emergency Medicine

## 2012-11-29 ENCOUNTER — Encounter (HOSPITAL_COMMUNITY): Payer: Self-pay | Admitting: Emergency Medicine

## 2012-11-29 ENCOUNTER — Emergency Department (HOSPITAL_COMMUNITY): Payer: BC Managed Care – PPO

## 2012-11-29 ENCOUNTER — Emergency Department (HOSPITAL_COMMUNITY)
Admission: EM | Admit: 2012-11-29 | Discharge: 2012-11-29 | Disposition: A | Discharge status: Nursing Home (Includes ICF) | Payer: BC Managed Care – PPO | Source: Home / Self Care | Attending: Family Medicine | Admitting: Family Medicine

## 2012-11-29 DIAGNOSIS — Z79899 Other long term (current) drug therapy: Secondary | ICD-10-CM | POA: Insufficient documentation

## 2012-11-29 DIAGNOSIS — K5792 Diverticulitis of intestine, part unspecified, without perforation or abscess without bleeding: Secondary | ICD-10-CM

## 2012-11-29 DIAGNOSIS — Z8742 Personal history of other diseases of the female genital tract: Secondary | ICD-10-CM | POA: Insufficient documentation

## 2012-11-29 DIAGNOSIS — Z9079 Acquired absence of other genital organ(s): Secondary | ICD-10-CM | POA: Insufficient documentation

## 2012-11-29 DIAGNOSIS — F17200 Nicotine dependence, unspecified, uncomplicated: Secondary | ICD-10-CM | POA: Insufficient documentation

## 2012-11-29 DIAGNOSIS — Z9889 Other specified postprocedural states: Secondary | ICD-10-CM | POA: Insufficient documentation

## 2012-11-29 DIAGNOSIS — Z8619 Personal history of other infectious and parasitic diseases: Secondary | ICD-10-CM | POA: Insufficient documentation

## 2012-11-29 DIAGNOSIS — Z880 Allergy status to penicillin: Secondary | ICD-10-CM | POA: Insufficient documentation

## 2012-11-29 DIAGNOSIS — Z90710 Acquired absence of both cervix and uterus: Secondary | ICD-10-CM | POA: Insufficient documentation

## 2012-11-29 DIAGNOSIS — K5732 Diverticulitis of large intestine without perforation or abscess without bleeding: Secondary | ICD-10-CM | POA: Insufficient documentation

## 2012-11-29 DIAGNOSIS — I10 Essential (primary) hypertension: Secondary | ICD-10-CM | POA: Insufficient documentation

## 2012-11-29 DIAGNOSIS — K5733 Diverticulitis of large intestine without perforation or abscess with bleeding: Secondary | ICD-10-CM

## 2012-11-29 HISTORY — DX: Essential (primary) hypertension: I10

## 2012-11-29 LAB — COMPREHENSIVE METABOLIC PANEL
BUN: 8 mg/dL (ref 6–23)
CO2: 25 mEq/L (ref 19–32)
Calcium: 9.4 mg/dL (ref 8.4–10.5)
Chloride: 98 mEq/L (ref 96–112)
Creatinine, Ser: 0.61 mg/dL (ref 0.50–1.10)
GFR calc Af Amer: >90 mL/min (ref >90)
GFR calc non Af Amer: >90 mL/min (ref >90)
Glucose, Bld: 92 mg/dL (ref 70–99)
Total Bilirubin: 0.5 mg/dL (ref 0.3–1.2)

## 2012-11-29 LAB — CBC WITH DIFFERENTIAL/PLATELET
Eosinophils Relative: 2 % (ref 0–5)
HCT: 35.3 % — ABNORMAL LOW (ref 36.0–46.0)
Hemoglobin: 12.1 g/dL (ref 12.0–15.0)
Lymphocytes Relative: 42 % (ref 12–46)
Lymphs Abs: 3.4 10*3/uL (ref 0.7–4.0)
MCV: 88.3 fL (ref 78.0–100.0)
Monocytes Absolute: 1.3 10*3/uL — ABNORMAL HIGH (ref 0.1–1.0)
Monocytes Relative: 15 % — ABNORMAL HIGH (ref 3–12)
RBC: 4 MIL/uL (ref 3.87–5.11)
RDW: 12.3 % (ref 11.5–15.5)
WBC: 8.2 10*3/uL (ref 4.0–10.5)

## 2012-11-29 LAB — POCT URINALYSIS DIP (DEVICE)
Bilirubin Urine: NEGATIVE
Ketones, ur: NEGATIVE mg/dL
Protein, ur: NEGATIVE mg/dL
Specific Gravity, Urine: >=1.03 (ref 1.005–1.030)

## 2012-11-29 MED ORDER — MORPHINE SULFATE 4 MG/ML IJ SOLN
4.0000 mg | Freq: Once | INTRAMUSCULAR | Status: AC
  Administered 2012-11-29: 4 mg via INTRAVENOUS
  Filled 2012-11-29: qty 1

## 2012-11-29 MED ORDER — IOHEXOL 300 MG/ML  SOLN
25.0000 mL | Freq: Once | INTRAMUSCULAR | Status: AC | PRN
  Administered 2012-11-29: 25 mL via ORAL

## 2012-11-29 MED ORDER — METRONIDAZOLE 500 MG PO TABS
500.0000 mg | ORAL_TABLET | Freq: Once | ORAL | Status: AC
  Administered 2012-11-29: 500 mg via ORAL
  Filled 2012-11-29: qty 1

## 2012-11-29 MED ORDER — SODIUM CHLORIDE 0.9 % IV BOLUS (SEPSIS)
1000.0000 mL | Freq: Once | INTRAVENOUS | Status: AC
  Administered 2012-11-29: 1000 mL via INTRAVENOUS

## 2012-11-29 MED ORDER — CIPROFLOXACIN HCL 500 MG PO TABS
500.0000 mg | ORAL_TABLET | Freq: Once | ORAL | Status: AC
  Administered 2012-11-29: 500 mg via ORAL
  Filled 2012-11-29: qty 1

## 2012-11-29 MED ORDER — CIPROFLOXACIN HCL 500 MG PO TABS: 500.0000 mg | ORAL_TABLET | Freq: Two times a day (BID) | ORAL | Status: DC

## 2012-11-29 MED ORDER — IOHEXOL 300 MG/ML  SOLN
100.0000 mL | Freq: Once | INTRAMUSCULAR | Status: AC | PRN
  Administered 2012-11-29: 100 mL via INTRAVENOUS

## 2012-11-29 MED ORDER — METRONIDAZOLE 500 MG PO TABS: 500.0000 mg | ORAL_TABLET | Freq: Two times a day (BID) | ORAL | Status: DC

## 2012-11-29 MED ORDER — OXYCODONE-ACETAMINOPHEN 5-325 MG PO TABS: ORAL_TABLET | ORAL | Status: DC

## 2012-11-29 NOTE — ED Provider Notes (Signed)
CSN: [id number]     Arrival date & time 11/29/12  9147 History   First MD Initiated Contact with Patient 11/29/12 [phone]     Chief Complaint  Patient presents with  . Back Pain   (Consider location/radiation/quality/duration/timing/severity/associated sxs/prior Treatment) Patient is a 42 y.o. female presenting with abdominal pain. The history is provided by the patient.  Abdominal Pain This is a new problem. The current episode started 2 days ago (no h/o similar sx, soft freq stool.). The problem has been gradually worsening. Associated symptoms include abdominal pain. Associated symptoms comments: Radiating to back with blood in stool noted..    Past Medical History  Diagnosis Date  . History of syphilis   . Breast fibroadenoma   . History of ectopic pregnancy 2005    r salpyngectomy  . History of conization of cervix 2000  . H/O myomectomy 07-2006    robotic  . History of hysterectomy, supracervical 08-2008    robotic  . PMS (premenstrual syndrome)   . Back pain   . H/O sinusitis   . GERD (gastroesophageal reflux disease)   . Hx of herpes simplex type 2 infection   . History of syphilis   . Abnormal Pap smear 2000    [facility] Biopsy  . H/O metrorrhagia 12/2006   Past Surgical History  Procedure Laterality Date  . Cervical cone biopsy  2000  . Ectopic pregnancy surgery  2005    R salpyngectomy  . Myomectomy  2008    robotic  . Robotic assisted lap vaginal hysterectomy  2010    supracervical  . Polypectomy  2009  . Salpingectomy  2005  . Dilation and curettage of uterus  2009  . Abdominal hysterectomy    . Breast surgery  2004    fibroademoma  . Breast lumpectomy  10/11    rt-neg  . Trigger finger release Right 09/28/2012    Procedure: RELEASE TRIGGER FINGER/A-1 PULLEY RIGHT THUMB;  Surgeon: Jodi Marble, MD;  Location: [facility];  Service: Orthopedics;  Laterality: Right;   Family History  Problem Relation Age of Onset  . Hypertension Paternal  Grandmother   . Diabetes Paternal Grandmother   . Hypertension Maternal Grandmother   . Sarcoidosis Mother   . Hypertension Mother   . Thyroid disease Maternal Aunt   . Mental illness Cousin    History  Substance Use Topics  . Smoking status: Current Every Day Smoker -- 0.25 packs/day for 15 years  . Smokeless tobacco: Never Used  . Alcohol Use: 1.5 oz/week    3 drink(s) per week   OB History   Grav Para Term Preterm Abortions TAB SAB Ect Mult Living   4 2 2  2 1  0 1 0 2     Review of Systems  Constitutional: Positive for fever, chills and appetite change.  Respiratory: Negative.   Cardiovascular: Negative.   Gastrointestinal: Positive for nausea, abdominal pain, diarrhea and blood in stool. Negative for vomiting, constipation and rectal pain.  Genitourinary: Negative for vaginal bleeding, vaginal discharge and menstrual problem.    Allergies  Keflex and Penicillins  Home Medications   Current Outpatient Rx  Name  Route  Sig  Dispense  Refill  . Black Cohosh (REMIFEMIN MENOPAUSE PO)   Oral   Take 2 tablets by mouth daily.         . calcium citrate-vitamin D (CITRACAL+D) 315-200 MG-UNIT per tablet   Oral   Take 1 tablet by mouth daily.         Marland Kitchen  Cranberry 250 MG TABS   Oral   Take 2 tablets by mouth daily.         Marland Kitchen ibuprofen (ADVIL,MOTRIN) 200 MG tablet   Oral   Take 600 mg by mouth every 6 (six) hours as needed for pain.         Marland Kitchen lisinopril (PRINIVIL,ZESTRIL) 10 MG tablet   Oral   Take 10 mg by mouth daily.         Marland Kitchen OVER THE COUNTER MEDICATION   Oral   Take 2 tablets by mouth daily. Hair skin & nails multivitamin with biotin          BP 131/82  Pulse 93  Temp(Src) 98.4 F (36.9 C) (Oral)  Resp 19  SpO2 98% Physical Exam  Nursing note and vitals reviewed. Constitutional: She is oriented to person, place, and time. She appears well-developed and well-nourished.  Cardiovascular: Normal rate and regular rhythm.   Pulmonary/Chest: Breath  sounds normal.  Abdominal: Soft. She exhibits no pulsatile midline mass and no mass. Bowel sounds are decreased. There is no hepatosplenomegaly. There is tenderness in the left lower quadrant. There is guarding. There is no rigidity, no rebound, no CVA tenderness, no tenderness at McBurney's point and negative Murphy's sign. Hernia confirmed negative in the right inguinal area and confirmed negative in the left inguinal area.    Genitourinary: Guaiac positive stool.  S/p hysterectomy, pressure in llq with rectal exam, stool brown guiac pos.  Neurological: She is alert and oriented to person, place, and time.  Skin: Skin is warm and dry.    ED Course  Procedures (including critical care time) Labs Review Labs Reviewed  POCT URINALYSIS DIP (DEVICE)   Imaging Review No results found.    MDM   1. Diverticulitis of colon with bleeding    Sent for eval of acute diverticulitis, fever, chills, blood in stool, llq pain.    Linna Hoff, MD 11/29/12 1009

## 2012-11-29 NOTE — ED Provider Notes (Signed)
CSN: [id number]     Arrival date & time 11/29/12  1033 History   First MD Initiated Contact with Patient 11/29/12 1047     Chief Complaint  Patient presents with  . Rectal Bleeding  . Abdominal Pain  . Back Pain   (Consider location/radiation/quality/duration/timing/severity/associated sxs/prior Treatment) HPI  [name] is a 42 y.o. female complaining of LLQ pain, rated at 7/10, radiates to back worsening over the last 3 days associated with with blood-streaked, loose stool yesterday. No exacerbating or alleviating factors identified. Pt has not had a BM today. She reports subjective fever 3 days ago, now resolved. Denies N/V, CP, SOB.    Past Medical History  Diagnosis Date  . History of syphilis   . Breast fibroadenoma   . History of ectopic pregnancy 2005    r salpyngectomy  . History of conization of cervix 2000  . H/O myomectomy 07-2006    robotic  . History of hysterectomy, supracervical 08-2008    robotic  . PMS (premenstrual syndrome)   . Back pain   . H/O sinusitis   . GERD (gastroesophageal reflux disease)   . Hx of herpes simplex type 2 infection   . History of syphilis   . Abnormal Pap smear 2000    [facility] Biopsy  . H/O metrorrhagia 12/2006   Past Surgical History  Procedure Laterality Date  . Cervical cone biopsy  2000  . Ectopic pregnancy surgery  2005    R salpyngectomy  . Myomectomy  2008    robotic  . Robotic assisted lap vaginal hysterectomy  2010    supracervical  . Polypectomy  2009  . Salpingectomy  2005  . Dilation and curettage of uterus  2009  . Abdominal hysterectomy    . Breast surgery  2004    fibroademoma  . Breast lumpectomy  10/11    rt-neg  . Trigger finger release Right 09/28/2012    Procedure: RELEASE TRIGGER FINGER/A-1 PULLEY RIGHT THUMB;  Surgeon: Jodi Marble, MD;  Location: [facility];  Service: Orthopedics;  Laterality: Right;   Family History  Problem Relation Age of Onset  . Hypertension Paternal  Grandmother   . Diabetes Paternal Grandmother   . Hypertension Maternal Grandmother   . Sarcoidosis Mother   . Hypertension Mother   . Thyroid disease Maternal Aunt   . Mental illness Cousin    History  Substance Use Topics  . Smoking status: Current Every Day Smoker -- 0.25 packs/day for 15 years  . Smokeless tobacco: Never Used  . Alcohol Use: 1.5 oz/week    3 drink(s) per week   OB History   Grav Para Term Preterm Abortions TAB SAB Ect Mult Living   4 2 2  2 1  0 1 0 2     Review of Systems 10 systems reviewed and found to be negative, except as noted in the HPI  Allergies  Keflex and Penicillins  Home Medications   Current Outpatient Rx  Name  Route  Sig  Dispense  Refill  . Black Cohosh (REMIFEMIN MENOPAUSE PO)   Oral   Take 2 tablets by mouth daily.         . calcium citrate-vitamin D (CITRACAL+D) 315-200 MG-UNIT per tablet   Oral   Take 1 tablet by mouth daily.         . Cranberry 250 MG TABS   Oral   Take 2 tablets by mouth daily.         Marland Kitchen ibuprofen (  ADVIL,MOTRIN) 200 MG tablet   Oral   Take 600 mg by mouth every 6 (six) hours as needed for pain.         Marland Kitchen lisinopril (PRINIVIL,ZESTRIL) 10 MG tablet   Oral   Take 10 mg by mouth daily.         Marland Kitchen OVER THE COUNTER MEDICATION   Oral   Take 2 tablets by mouth daily. Hair skin & nails multivitamin with biotin          BP 129/67  Pulse 94  Temp(Src) 98.3 F (36.8 C) (Oral)  Resp 18  Ht 5\' 4"  (1.626 m)  Wt 186 lb (84.369 kg)  BMI 31.91 kg/m2  SpO2 99% Physical Exam  Nursing note and vitals reviewed. Constitutional: She is oriented to person, place, and time. She appears well-developed and well-nourished. No distress.  HENT:  Head: Normocephalic.  Mouth/Throat: Oropharynx is clear and moist.  Eyes: Conjunctivae and EOM are normal. Pupils are equal, round, and reactive to light.  Neck: Normal range of motion.  Cardiovascular: Normal rate, regular rhythm and intact distal pulses.    Pulmonary/Chest: Effort normal and breath sounds normal. No stridor. No respiratory distress. She has no wheezes. She has no rales. She exhibits no tenderness.  Abdominal: Soft. Bowel sounds are normal. She exhibits no distension and no mass. There is tenderness. There is no rebound and no guarding.  TTP: LLQ>RLQ, no guarding or rebound  Musculoskeletal: Normal range of motion.  Neurological: She is alert and oriented to person, place, and time.  Psychiatric: She has a normal mood and affect.    ED Course  Procedures (including critical care time) Labs Review Labs Reviewed  CBC WITH DIFFERENTIAL - Abnormal; Notable for the following:    HCT 35.3 (*)    Neutrophils Relative % 41 (*)    Monocytes Relative 15 (*)    Monocytes Absolute 1.3 (*)    All other components within normal limits  COMPREHENSIVE METABOLIC PANEL   Imaging Review No results found.  EKG Interpretation   None       MDM   1. Diverticulitis     Filed Vitals:   11/29/12 1315 11/29/12 1400 11/29/12 1445 11/29/12 1630  BP: 116/55 121/63 114/48 133/70  Pulse: 83 83 76   Temp:      TempSrc:      Resp:      Height:      Weight:      SpO2: 100% 100% 96%      [name] is a 42 y.o. female with LLQ pain and blood streaked loose stool with subjective fever x3 days. Abd exam non-surgical. CT shows diverticulitis. Pt has been tolerating PO and is appropriate for outpatient treatment.   Medications  morphine 4 MG/ML injection 4 mg (4 mg Intravenous Given 11/29/12 1140)  sodium chloride 0.9 % bolus 1,000 mL (1,000 mLs Intravenous New Bag/Given 11/29/12 1143)  iohexol (OMNIPAQUE) 300 MG/ML solution 25 mL (25 mLs Oral Contrast Given 11/29/12 1124)    Pt is hemodynamically stable, appropriate for, and amenable to discharge at this time. Pt verbalized understanding and agrees with care plan. All questions answered. Outpatient follow-up and specific return precautions discussed.    Discharge Medication  List as of 11/29/2012  4:14 PM    START taking these medications   Details  ciprofloxacin (CIPRO) 500 MG tablet Take 1 tablet (500 mg total) by mouth every 12 (twelve) hours., Starting 11/29/2012, Until Discontinued, Print    metroNIDAZOLE (FLAGYL) 500  MG tablet Take 1 tablet (500 mg total) by mouth 2 (two) times daily. One tab PO bid x 10 days, Starting 11/29/2012, Until Discontinued, Print    oxyCODONE-acetaminophen (PERCOCET/ROXICET) 5-325 MG per tablet 1 to 2 tabs PO q6hrs  PRN for pain, Print        Note: Portions of this report may have been transcribed using voice recognition software. Every effort was made to ensure accuracy; however, inadvertent computerized transcription errors may be present   ADDENDUM: 12/02/2012 10:21 AM Called patient to check status and advise her that she must have a follow up CT to confirm resolution of abnormalities seen on initial ABD/Pel CT. She has said that she will follow with her PCP for this. She has also said that the percocet gives her bad dreams and rash so she self dc'd it and started taking vicodin. She states the pain is not really improving and has moved to the Right side. I have advised her to return to ED for recheck which she states she will do this AM.    Wynetta Emery, PA-C 12/02/12 1027

## 2012-11-29 NOTE — ED Notes (Signed)
Pt  Reports   llq    Pain       With  Blood  Tinged             Stool  With      Pain radiating  To  Back  For  Several  Days    Pt reports  Initially  Had  Some  Diarrhea  But  None  Now         Ambulated  To  Room with a  Slow  Steady  gait

## 2012-11-29 NOTE — Discharge Instructions (Signed)
Take percocet for breakthrough pain, do not drink alcohol, drive, care for children or do other critical tasks while taking percocet.   Please contact your primary care doctor and let them know that you were seen in the emergency room. They must obtain records for evaluation and further management.   Follow with your primary care doctor for a check up in the next 24-48 hours.  Return to the emergency room IMMEDIATELY if you have any NEW or WORSENING symptoms.   Diverticulitis A diverticulum is a small pouch or sac on the colon. Diverticulosis is the presence of these diverticula on the colon. Diverticulitis is the irritation (inflammation) or infection of diverticula. CAUSES  The colon and its diverticula contain bacteria. If food particles block the tiny opening to a diverticulum, the bacteria inside can grow and cause an increase in pressure. This leads to infection and inflammation and is called diverticulitis. SYMPTOMS   Abdominal pain and tenderness. Usually, the pain is located on the left side of your abdomen. However, it could be located elsewhere.  Fever.  Bloating.  Feeling sick to your stomach (nausea).  Throwing up (vomiting).  Abnormal stools. DIAGNOSIS  Your caregiver will take a history and perform a physical exam. Since many things can cause abdominal pain, other tests may be necessary. Tests may include:  Blood tests.  Urine tests.  X-ray of the abdomen.  CT scan of the abdomen. Sometimes, surgery is needed to determine if diverticulitis or other conditions are causing your symptoms. TREATMENT  Most of the time, you can be treated without surgery. Treatment includes:  Resting the bowels by only having liquids for a few days. As you improve, you will need to eat a low-fiber diet.  Intravenous (IV) fluids if you are losing body fluids (dehydrated).  Antibiotic medicines that treat infections may be given.  Pain and nausea medicine, if needed.  Surgery if  the inflamed diverticulum has burst. HOME CARE INSTRUCTIONS   Try a clear liquid diet (broth, tea, or water for as long as directed by your caregiver). You may then gradually begin a low-fiber diet as tolerated.  A low-fiber diet is a diet with less than 10 grams of fiber. Choose the foods below to reduce fiber in the diet:  White breads, cereals, rice, and pasta.  Cooked fruits and vegetables or soft fresh fruits and vegetables without the skin.  Ground or well-cooked tender beef, ham, veal, lamb, pork, or poultry.  Eggs and seafood.  After your diverticulitis symptoms have improved, your caregiver may put you on a high-fiber diet. A high-fiber diet includes 14 grams of fiber for every 1000 calories consumed. For a standard 2000 calorie diet, you would need 28 grams of fiber. Follow these diet guidelines to help you increase the fiber in your diet. It is important to slowly increase the amount fiber in your diet to avoid gas, constipation, and bloating.  Choose whole-grain breads, cereals, pasta, and brown rice.  Choose fresh fruits and vegetables with the skin on. Do not overcook vegetables because the more vegetables are cooked, the more fiber is lost.  Choose more nuts, seeds, legumes, dried peas, beans, and lentils.  Look for food products that have greater than 3 grams of fiber per serving on the Nutrition Facts label.  Take all medicine as directed by your caregiver.  If your caregiver has given you a follow-up appointment, it is very important that you go. Not going could result in lasting (chronic) or permanent injury, pain, and  disability. If there is any problem keeping the appointment, call to reschedule. SEEK MEDICAL CARE IF:   Your pain does not improve.  You have a hard time advancing your diet beyond clear liquids.  Your bowel movements do not return to normal. SEEK IMMEDIATE MEDICAL CARE IF:   Your pain becomes worse.  You have an oral temperature above 102 F  (38.9 C), not controlled by medicine.  You have repeated vomiting.  You have bloody or black, tarry stools.  Symptoms that brought you to your caregiver become worse or are not getting better. MAKE SURE YOU:   Understand these instructions.  Will watch your condition.  Will get help right away if you are not doing well or get worse. Document Released: 11/14/2004 Document Revised: 04/29/2011 Document Reviewed: 03/12/2010 [facility] Patient Information 2014 [place], [place].

## 2012-11-29 NOTE — ED Notes (Signed)
Pt reports left lower quadrant abdominal pain onset Friday. Pt reports noticed on Saturday dark red blood in her stool. Pt seen at urgent care today and sent for further eval. Pt denies N/V. Pt reports having diarrhea since Friday.

## 2012-11-29 NOTE — ED Notes (Signed)
Patient transported to CT 

## 2012-12-02 ENCOUNTER — Emergency Department (HOSPITAL_COMMUNITY): Payer: BC Managed Care – PPO

## 2012-12-02 ENCOUNTER — Encounter (HOSPITAL_COMMUNITY): Payer: Self-pay | Admitting: Emergency Medicine

## 2012-12-02 ENCOUNTER — Emergency Department (HOSPITAL_COMMUNITY)
Admission: EM | Admit: 2012-12-02 | Discharge: 2012-12-02 | Disposition: A | Discharge status: Home / Self Care | Payer: BC Managed Care – PPO | Attending: Emergency Medicine | Admitting: Emergency Medicine

## 2012-12-02 DIAGNOSIS — Z90711 Acquired absence of uterus with remaining cervical stump: Secondary | ICD-10-CM | POA: Insufficient documentation

## 2012-12-02 DIAGNOSIS — Z9889 Other specified postprocedural states: Secondary | ICD-10-CM | POA: Insufficient documentation

## 2012-12-02 DIAGNOSIS — F17200 Nicotine dependence, unspecified, uncomplicated: Secondary | ICD-10-CM | POA: Insufficient documentation

## 2012-12-02 DIAGNOSIS — Z8742 Personal history of other diseases of the female genital tract: Secondary | ICD-10-CM | POA: Insufficient documentation

## 2012-12-02 DIAGNOSIS — I10 Essential (primary) hypertension: Secondary | ICD-10-CM | POA: Insufficient documentation

## 2012-12-02 DIAGNOSIS — K5732 Diverticulitis of large intestine without perforation or abscess without bleeding: Secondary | ICD-10-CM | POA: Insufficient documentation

## 2012-12-02 DIAGNOSIS — Z8619 Personal history of other infectious and parasitic diseases: Secondary | ICD-10-CM | POA: Insufficient documentation

## 2012-12-02 DIAGNOSIS — Z79899 Other long term (current) drug therapy: Secondary | ICD-10-CM | POA: Insufficient documentation

## 2012-12-02 DIAGNOSIS — Z880 Allergy status to penicillin: Secondary | ICD-10-CM | POA: Insufficient documentation

## 2012-12-02 DIAGNOSIS — K5792 Diverticulitis of intestine, part unspecified, without perforation or abscess without bleeding: Secondary | ICD-10-CM

## 2012-12-02 LAB — CBC WITH DIFFERENTIAL/PLATELET
Basophils Absolute: 0 10*3/uL (ref 0.0–0.1)
Basophils Relative: 0 % (ref 0–1)
Eosinophils Relative: 1 % (ref 0–5)
HCT: 36.2 % (ref 36.0–46.0)
MCHC: 34 g/dL (ref 30.0–36.0)
MCV: 89.6 fL (ref 78.0–100.0)
Monocytes Absolute: 0.5 10*3/uL (ref 0.1–1.0)
RBC: 4.04 MIL/uL (ref 3.87–5.11)
RDW: 12.4 % (ref 11.5–15.5)

## 2012-12-02 LAB — COMPREHENSIVE METABOLIC PANEL
ALT: 18 U/L (ref 0–35)
AST: 22 U/L (ref 0–37)
Albumin: 3.8 g/dL (ref 3.5–5.2)
BUN: 7 mg/dL (ref 6–23)
CO2: 27 mEq/L (ref 19–32)
Calcium: 9.5 mg/dL (ref 8.4–10.5)
Creatinine, Ser: 0.67 mg/dL (ref 0.50–1.10)
GFR calc non Af Amer: >90 mL/min (ref >90)

## 2012-12-02 LAB — WET PREP, GENITAL
Clue Cells Wet Prep HPF POC: NONE SEEN
Trich, Wet Prep: NONE SEEN
Yeast Wet Prep HPF POC: NONE SEEN

## 2012-12-02 MED ORDER — IOHEXOL 300 MG/ML  SOLN
25.0000 mL | INTRAMUSCULAR | Status: AC
  Administered 2012-12-02: 25 mL via ORAL

## 2012-12-02 MED ORDER — HYDROCODONE-ACETAMINOPHEN 5-325 MG PO TABS: 2.0000 | ORAL_TABLET | ORAL | Status: DC | PRN

## 2012-12-02 MED ORDER — IOHEXOL 350 MG/ML SOLN
80.0000 mL | Freq: Once | INTRAVENOUS | Status: AC | PRN
  Administered 2012-12-02: 80 mL via INTRAVENOUS

## 2012-12-02 NOTE — ED Notes (Signed)
Per pt sts she was recently seen here and treated for diverticulitis. sts LLQ pain and now RLQ pain that started today. sts also some lower back pain. Denies N,V but sts reflux.

## 2012-12-02 NOTE — ED Notes (Signed)
Pt finished drinking 1st cup of oral CT contrast, CT has been notified.

## 2012-12-02 NOTE — ED Provider Notes (Signed)
Medical screening examination/treatment/procedure(s) were performed by non-physician practitioner and as supervising physician I was immediately available for consultation/collaboration.   Lisette Mancebo B. Bernette Mayers, MD 12/02/12 [phone]

## 2012-12-02 NOTE — ED Provider Notes (Signed)
CSN: [id number]     Arrival date & time 12/02/12  1120 History   First MD Initiated Contact with Patient 12/02/12 1133     Chief Complaint  Patient presents with  . Abdominal Pain   (Consider location/radiation/quality/duration/timing/severity/associated sxs/prior Treatment) Patient is a 42 y.o. female presenting with abdominal pain. The history is provided by the patient. No language interpreter was used.  Abdominal Pain Pain location:  LLQ and RLQ Pain quality: aching, cramping and throbbing   Duration:  1 day Timing:  Intermittent Progression:  Waxing and waning Relieved by: took vicodin at home with some relief. Associated symptoms: no chills, no diarrhea, no dysuria, no fever, no melena, no nausea, no shortness of breath and no vomiting     Past Medical History  Diagnosis Date  . History of syphilis   . Breast fibroadenoma   . History of ectopic pregnancy 2005    r salpyngectomy  . History of conization of cervix 2000  . H/O myomectomy 07-2006    robotic  . History of hysterectomy, supracervical 08-2008    robotic  . PMS (premenstrual syndrome)   . Back pain   . H/O sinusitis   . GERD (gastroesophageal reflux disease)   . Hx of herpes simplex type 2 infection   . History of syphilis   . Abnormal Pap smear 2000    [facility] Biopsy  . H/O metrorrhagia 12/2006  . Hypertension    Past Surgical History  Procedure Laterality Date  . Cervical cone biopsy  2000  . Ectopic pregnancy surgery  2005    R salpyngectomy  . Myomectomy  2008    robotic  . Robotic assisted lap vaginal hysterectomy  2010    supracervical  . Polypectomy  2009  . Salpingectomy  2005  . Dilation and curettage of uterus  2009  . Abdominal hysterectomy    . Breast surgery  2004    fibroademoma  . Breast lumpectomy  10/11    rt-neg  . Trigger finger release Right 09/28/2012    Procedure: RELEASE TRIGGER FINGER/A-1 PULLEY RIGHT THUMB;  Surgeon: Jodi Marble, MD;  Location: [facility];  Service: Orthopedics;  Laterality: Right;   Family History  Problem Relation Age of Onset  . Hypertension Paternal Grandmother   . Diabetes Paternal Grandmother   . Hypertension Maternal Grandmother   . Sarcoidosis Mother   . Hypertension Mother   . Thyroid disease Maternal Aunt   . Mental illness Cousin    History  Substance Use Topics  . Smoking status: Current Every Day Smoker -- 0.25 packs/day for 15 years  . Smokeless tobacco: Never Used  . Alcohol Use: 1.5 oz/week    3 drink(s) per week   OB History   Grav Para Term Preterm Abortions TAB SAB Ect Mult Living   4 2 2  2 1  0 1 0 2     Review of Systems  Constitutional: Negative for fever and chills.  Respiratory: Negative for shortness of breath.   Gastrointestinal: Positive for abdominal pain. Negative for nausea, vomiting, diarrhea and melena.  Genitourinary: Negative for dysuria and difficulty urinating.  Neurological: Negative for dizziness and weakness.  All other systems reviewed and are negative.   5/10  bm last night formed   Allergies  Keflex and Penicillins  Home Medications   Current Outpatient Rx  Name  Route  Sig  Dispense  Refill  . Black Cohosh (REMIFEMIN MENOPAUSE PO)   Oral   Take 2  tablets by mouth daily.         . ciprofloxacin (CIPRO) 500 MG tablet   Oral   Take 500 mg by mouth 2 (two) times daily. Starting on 11/29/12 for 10 days         . HYDROcodone-acetaminophen (NORCO/VICODIN) 5-325 MG per tablet   Oral   Take 1 tablet by mouth every 4 (four) hours as needed for pain.          Marland Kitchen lisinopril (PRINIVIL,ZESTRIL) 10 MG tablet   Oral   Take 10 mg by mouth daily.         . metroNIDAZOLE (FLAGYL) 500 MG tablet   Oral   Take 500 mg by mouth 2 (two) times daily. Starting on 11/29/12 for 10 days         . Multiple Vitamins-Minerals (HAIR/SKIN/NAILS/BIOTIN PO)   Oral   Take 2 tablets by mouth daily.          BP 149/77  Pulse 86  Temp(Src) 97.9 F (36.6 C)  (Oral)  Resp 24  Ht 5\' 4"  (1.626 m)  Wt 192 lb (87.091 kg)  BMI 32.94 kg/m2  SpO2 98% Physical Exam  ED Course  Procedures (including critical care time) Labs Review Labs Reviewed - No data to display Imaging Review No results found.  EKG Interpretation   None       MDM   1. Diverticulitis     Resolving diverticulitis. Stable dominant follicle right ovary per Abd/pelvis CT. Pt feeling better and ok to go home. Norco prescription for home and continue antibiotics.    Irish Elders, NP 12/02/12 2038

## 2012-12-02 NOTE — ED Provider Notes (Signed)
Medical screening examination/treatment/procedure(s) were conducted as a shared visit with non-physician practitioner(s) and myself.  I personally evaluated the patient during the encounter   Patient with soft abd but continued abd pain and now new abd pain on right. Will do pelvic and re-eval with another CT to make sure there is no worsening of her diverticulitis. If neg will give po pain control (percocet make her cyst, will change to norco)  Audree Camel, MD 12/02/12 2202

## 2012-12-03 LAB — GC/CHLAMYDIA PROBE AMP
CT Probe RNA: NEGATIVE
GC Probe RNA: NEGATIVE

## 2012-12-24 ENCOUNTER — Other Ambulatory Visit: Payer: Self-pay

## 2013-04-19 ENCOUNTER — Other Ambulatory Visit: Payer: Self-pay

## 2013-10-25 ENCOUNTER — Encounter (HOSPITAL_COMMUNITY): Payer: Self-pay | Admitting: Emergency Medicine

## 2013-10-25 ENCOUNTER — Emergency Department (HOSPITAL_COMMUNITY)
Admission: EM | Admit: 2013-10-25 | Discharge: 2013-10-25 | Disposition: A | Discharge status: Home / Self Care | Payer: BC Managed Care – PPO | Attending: Emergency Medicine | Admitting: Emergency Medicine

## 2013-10-25 ENCOUNTER — Emergency Department (HOSPITAL_COMMUNITY): Payer: BC Managed Care – PPO

## 2013-10-25 DIAGNOSIS — Z8619 Personal history of other infectious and parasitic diseases: Secondary | ICD-10-CM | POA: Insufficient documentation

## 2013-10-25 DIAGNOSIS — K5732 Diverticulitis of large intestine without perforation or abscess without bleeding: Secondary | ICD-10-CM | POA: Insufficient documentation

## 2013-10-25 DIAGNOSIS — Z90710 Acquired absence of both cervix and uterus: Secondary | ICD-10-CM | POA: Insufficient documentation

## 2013-10-25 DIAGNOSIS — Z79899 Other long term (current) drug therapy: Secondary | ICD-10-CM | POA: Diagnosis not present

## 2013-10-25 DIAGNOSIS — I10 Essential (primary) hypertension: Secondary | ICD-10-CM | POA: Diagnosis not present

## 2013-10-25 DIAGNOSIS — Z8742 Personal history of other diseases of the female genital tract: Secondary | ICD-10-CM | POA: Diagnosis not present

## 2013-10-25 DIAGNOSIS — F17200 Nicotine dependence, unspecified, uncomplicated: Secondary | ICD-10-CM | POA: Insufficient documentation

## 2013-10-25 DIAGNOSIS — Z9889 Other specified postprocedural states: Secondary | ICD-10-CM | POA: Diagnosis not present

## 2013-10-25 DIAGNOSIS — Z8719 Personal history of other diseases of the digestive system: Secondary | ICD-10-CM | POA: Insufficient documentation

## 2013-10-25 DIAGNOSIS — Z880 Allergy status to penicillin: Secondary | ICD-10-CM | POA: Diagnosis not present

## 2013-10-25 DIAGNOSIS — R109 Unspecified abdominal pain: Secondary | ICD-10-CM | POA: Diagnosis present

## 2013-10-25 LAB — COMPREHENSIVE METABOLIC PANEL
ALBUMIN: 3.9 g/dL (ref 3.5–5.2)
ALT: 18 U/L (ref 0–35)
ANION GAP: 14 (ref 5–15)
AST: 20 U/L (ref 0–37)
Alkaline Phosphatase: 42 U/L (ref 39–117)
BUN: 9 mg/dL (ref 6–23)
CALCIUM: 9.3 mg/dL (ref 8.4–10.5)
CO2: 22 mEq/L (ref 19–32)
CREATININE: 0.74 mg/dL (ref 0.50–1.10)
Chloride: 101 mEq/L (ref 96–112)
GFR calc Af Amer: >90 mL/min (ref >90)
GFR calc non Af Amer: >90 mL/min (ref >90)
Glucose, Bld: 107 mg/dL — ABNORMAL HIGH (ref 70–99)
Potassium: 3.9 mEq/L (ref 3.7–5.3)
Sodium: 137 mEq/L (ref 137–147)
Total Bilirubin: 0.7 mg/dL (ref 0.3–1.2)
Total Protein: 7.5 g/dL (ref 6.0–8.3)

## 2013-10-25 LAB — CBC WITH DIFFERENTIAL/PLATELET
BASOS ABS: 0 10*3/uL (ref 0.0–0.1)
Basophils Relative: 0 % (ref 0–1)
EOS ABS: 0.1 10*3/uL (ref 0.0–0.7)
Eosinophils Relative: 1 % (ref 0–5)
HCT: 37.2 % (ref 36.0–46.0)
HEMOGLOBIN: 12.5 g/dL (ref 12.0–15.0)
Lymphocytes Relative: 33 % (ref 12–46)
Lymphs Abs: 3.5 10*3/uL (ref 0.7–4.0)
MCH: 30.5 pg (ref 26.0–34.0)
MCHC: 33.6 g/dL (ref 30.0–36.0)
MCV: 90.7 fL (ref 78.0–100.0)
MONO ABS: 1.6 10*3/uL — AB (ref 0.1–1.0)
Monocytes Relative: 15 % — ABNORMAL HIGH (ref 3–12)
NEUTROS ABS: 5.5 10*3/uL (ref 1.7–7.7)
Neutrophils Relative %: 51 % (ref 43–77)
Platelets: 292 10*3/uL (ref 150–400)
RBC: 4.1 MIL/uL (ref 3.87–5.11)
RDW: 12.5 % (ref 11.5–15.5)
WBC: 10.7 10*3/uL — AB (ref 4.0–10.5)

## 2013-10-25 LAB — URINALYSIS, ROUTINE W REFLEX MICROSCOPIC
BILIRUBIN URINE: NEGATIVE
Glucose, UA: NEGATIVE mg/dL
Hgb urine dipstick: NEGATIVE
Ketones, ur: 15 mg/dL — AB
NITRITE: NEGATIVE
PH: 7 (ref 5.0–8.0)
Protein, ur: NEGATIVE mg/dL
SPECIFIC GRAVITY, URINE: 1.034 — AB (ref 1.005–1.030)
UROBILINOGEN UA: 1 mg/dL (ref 0.0–1.0)

## 2013-10-25 LAB — URINE MICROSCOPIC-ADD ON

## 2013-10-25 LAB — LIPASE, BLOOD: LIPASE: 27 U/L (ref 11–59)

## 2013-10-25 MED ORDER — METRONIDAZOLE 500 MG PO TABS
500.0000 mg | ORAL_TABLET | Freq: Once | ORAL | Status: AC
  Administered 2013-10-25: 500 mg via ORAL
  Filled 2013-10-25: qty 1

## 2013-10-25 MED ORDER — CIPROFLOXACIN HCL 500 MG PO TABS: 500.0000 mg | ORAL_TABLET | Freq: Two times a day (BID) | ORAL | Status: DC

## 2013-10-25 MED ORDER — CIPROFLOXACIN IN D5W 400 MG/200ML IV SOLN
400.0000 mg | Freq: Once | INTRAVENOUS | Status: AC
  Administered 2013-10-25: 400 mg via INTRAVENOUS
  Filled 2013-10-25: qty 200

## 2013-10-25 MED ORDER — IOHEXOL 300 MG/ML  SOLN
80.0000 mL | Freq: Once | INTRAMUSCULAR | Status: AC | PRN
  Administered 2013-10-25: 80 mL via INTRAVENOUS

## 2013-10-25 MED ORDER — HYDROCODONE-ACETAMINOPHEN 5-325 MG PO TABS: 2.0000 | ORAL_TABLET | ORAL | Status: DC | PRN

## 2013-10-25 MED ORDER — HYDROCODONE-ACETAMINOPHEN 5-325 MG PO TABS
2.0000 | ORAL_TABLET | Freq: Once | ORAL | Status: AC
  Administered 2013-10-25: 2 via ORAL
  Filled 2013-10-25: qty 2

## 2013-10-25 MED ORDER — METRONIDAZOLE 500 MG PO TABS: 500.0000 mg | ORAL_TABLET | Freq: Three times a day (TID) | ORAL | Status: DC

## 2013-10-25 MED ORDER — IOHEXOL 300 MG/ML  SOLN
25.0000 mL | Freq: Once | INTRAMUSCULAR | Status: AC | PRN
  Administered 2013-10-25: 25 mL via ORAL

## 2013-10-25 NOTE — ED Notes (Signed)
Pt tolerated PO intake

## 2013-10-25 NOTE — Discharge Instructions (Signed)

## 2013-10-25 NOTE — ED Provider Notes (Signed)
43 year old female with a history of diverticulitis presents with a complaint of lower abdominal pain. This pain started on the right side and is now felt more in the middle and the left side. She has decreased appetite and on exam has a soft abdomen with focal tenderness in the lower abdomen including the right and left lower quadrant. There is no upper abdominal tenderness, normal heart and lung sounds.  Blood counts close to normal, vital signs within normal limits, CT scan with some inflammation in the pelvis on my evaluation. Results pending at this time. Pain medication, nausea medication  Filed Vitals:   10/25/13 0807 10/25/13 1010  BP: 154/99 154/80  Pulse: 103   Temp: 98.6 F (37 C)   TempSrc: Oral   Resp: 22 14  Height: 5\' 4"  (1.626 m)   Weight: 189 lb (85.73 kg)   SpO2: 99% 100%     Medical screening examination/treatment/procedure(s) were conducted as a shared visit with non-physician practitioner(s) and myself.  I personally evaluated the patient during the encounter.  Clinical Impression:   Final diagnoses:  Diverticulitis of large intestine without perforation or abscess without bleeding         Johnna Acosta, MD 10/27/13 [phone]

## 2013-10-25 NOTE — ED Notes (Signed)
Patient complains of lower abdominal pain and states that it feels "like when I had diverticulitis in October of last year".

## 2013-10-25 NOTE — ED Provider Notes (Signed)
CSN: [id number]     Arrival date & time 10/25/13  0802 History   First MD Initiated Contact with Patient 10/25/13 [phone]     Chief Complaint  Patient presents with  . Abdominal Pain     (Consider location/radiation/quality/duration/timing/severity/associated sxs/prior Treatment) HPI [name] is a 43 y.o. female with a history of diverticulitis, hysterectomy, ectopic pregnancy comes in for evaluation of lower abdominal pain. She states the pain started Saturday evening she describes it as a crampy pain that comes and goes it started on her right side and has migrated to her left side. She states the cramps will last for about 5 or 10 seconds and then go away. She has not tried anything for the pain. She says laying down makes the pain feel better moving around makes it worse. She reports her last bowel movement was Saturday morning before the cramps started, she has not passed any gas since the cramps started. She denies any blood in her stool. She reports a fever at home to [place]. She reports vomiting yesterday once and has had no appetite since Saturday. Past Medical History  Diagnosis Date  . History of syphilis   . Breast fibroadenoma   . History of ectopic pregnancy 2005    r salpyngectomy  . History of conization of cervix 2000  . H/O myomectomy 07-2006    robotic  . History of hysterectomy, supracervical 08-2008    robotic  . PMS (premenstrual syndrome)   . Back pain   . H/O sinusitis   . GERD (gastroesophageal reflux disease)   . Hx of herpes simplex type 2 infection   . History of syphilis   . Abnormal Pap smear 2000    Cone Biopsy  . H/O metrorrhagia 12/2006  . Hypertension    Past Surgical History  Procedure Laterality Date  . Cervical cone biopsy  2000  . Ectopic pregnancy surgery  2005    R salpyngectomy  . Myomectomy  2008    robotic  . Robotic assisted lap vaginal hysterectomy  2010    supracervical  . Polypectomy  2009  . Salpingectomy  2005  .  Dilation and curettage of uterus  2009  . Abdominal hysterectomy    . Breast surgery  2004    fibroademoma  . Breast lumpectomy  10/11    rt-neg  . Trigger finger release Right 09/28/2012    Procedure: RELEASE TRIGGER FINGER/A-1 PULLEY RIGHT THUMB;  Surgeon: Jolyn Nap, MD;  Location: [facility];  Service: Orthopedics;  Laterality: Right;   Family History  Problem Relation Age of Onset  . Hypertension Paternal Grandmother   . Diabetes Paternal Grandmother   . Hypertension Maternal Grandmother   . Sarcoidosis Mother   . Hypertension Mother   . Thyroid disease Maternal Aunt   . Mental illness Cousin    History  Substance Use Topics  . Smoking status: Current Every Day Smoker -- 0.25 packs/day for 15 years  . Smokeless tobacco: Never Used  . Alcohol Use: 1.5 oz/week    3 drink(s) per week   OB History   Grav Para Term Preterm Abortions TAB SAB Ect Mult Living   4 2 2  2 1  0 1 0 2     Review of Systems  Constitutional: Positive for fever.  HENT: Negative for sore throat.   Eyes: Negative for visual disturbance.  Respiratory: Negative for shortness of breath.   Cardiovascular: Negative for chest pain.  Gastrointestinal: Positive for vomiting  and abdominal pain. Negative for blood in stool.  Endocrine: Negative for polyuria.  Genitourinary: Negative for dysuria, vaginal bleeding and vaginal discharge.  Musculoskeletal: Negative for neck stiffness.  Skin: Negative for rash.  Neurological: Negative for headaches.      Allergies  Keflex and Penicillins  Home Medications   Prior to Admission medications   Medication Sig Start Date End Date Taking? Authorizing Provider  ibuprofen (ADVIL,MOTRIN) 800 MG tablet Take 800 mg by mouth every 8 (eight) hours as needed for moderate pain.   Yes Historical Provider, MD  lisinopril (PRINIVIL,ZESTRIL) 10 MG tablet Take 10 mg by mouth daily.   Yes Historical Provider, MD  Multiple Vitamins-Minerals  (HAIR/SKIN/NAILS/BIOTIN PO) Take 2 tablets by mouth daily.   Yes Historical Provider, MD   BP 154/99  Pulse 103  Temp(Src) 98.6 F (37 C) (Oral)  Resp 22  Ht 5\' 4"  (1.626 m)  Wt 189 lb (85.73 kg)  BMI 32.43 kg/m2  SpO2 99% Physical Exam  Nursing note and vitals reviewed. Constitutional: She is oriented to person, place, and time. She appears well-developed and well-nourished.  HENT:  Head: Normocephalic and atraumatic.  Mouth/Throat: Oropharynx is clear and moist.  Eyes: Conjunctivae are normal. Pupils are equal, round, and reactive to light. Right eye exhibits no discharge. Left eye exhibits no discharge. No scleral icterus.  Neck: Neck supple.  Cardiovascular: Normal rate, regular rhythm and normal heart sounds.   Pulmonary/Chest: Effort normal and breath sounds normal. No respiratory distress. She has no wheezes. She has no rales.  Abdominal:   Abdomen is soft, nondistended with no lesions or rashes appreciated. She does have exquisite tenderness in the right lower quadrant. She also has tenderness in the lower left quadrant to a lesser extent. Bowel sounds are present in all quadrants. No masses  Musculoskeletal: She exhibits no tenderness.  Neurological: She is alert and oriented to person, place, and time.  Cranial Nerves II-XII grossly intact  Skin: Skin is warm and dry. No rash noted.  Psychiatric: She has a normal mood and affect.    ED Course  Procedures (including critical care time) Labs Review Labs Reviewed  CBC WITH DIFFERENTIAL - Abnormal; Notable for the following:    WBC 10.7 (*)    Monocytes Relative 15 (*)    Monocytes Absolute 1.6 (*)    All other components within normal limits  COMPREHENSIVE METABOLIC PANEL  LIPASE, BLOOD  URINALYSIS, ROUTINE W REFLEX MICROSCOPIC    Imaging Review No results found.   EKG Interpretation None     Meds given in ED:  Medications  HYDROcodone-acetaminophen (NORCO/VICODIN) 5-325 MG per tablet 2 tablet (not  administered)  ciprofloxacin (CIPRO) IVPB 400 mg (not administered)  metroNIDAZOLE (FLAGYL) tablet 500 mg (not administered)  iohexol (OMNIPAQUE) 300 MG/ML solution 25 mL (25 mLs Oral Contrast Given 10/25/13 0917)  iohexol (OMNIPAQUE) 300 MG/ML solution 80 mL (80 mLs Intravenous Contrast Given 10/25/13 0939)    New Prescriptions   No medications on file   Filed Vitals:   10/25/13 0807 10/25/13 1010  BP: 154/99 154/80  Pulse: 103   Temp: 98.6 F (37 C)   TempSrc: Oral   Resp: 22 14  Height: 5\' 4"  (1.626 m)   Weight: 189 lb (85.73 kg)   SpO2: 99% 100%    MDM  Vitals stable - WNL -afebrile Pt resting comfortably in ED. pain managed in ED. Tolerating PO Due to patient's previous history of diverticulitis as well as results her clinical exam decision-making obtain abdominal  CT. Results of CT showed acute diverticulitis. Will treat with antibiotics, discharge to follow up with primary care. Will DC with Cipro and metronidazole for diverticulitis as well as Vicodin for pain management Discussed f/u with PCP and return precautions, pt very amenable to plan.   Final diagnoses:  None  Prior to patient discharge, I discussed and reviewed this case with Dr.Brian Acquanetta Sit, PA-C 10/25/13 1113

## 2013-10-25 NOTE — ED Notes (Signed)
PA at bedside.

## 2013-10-27 NOTE — ED Provider Notes (Signed)
Medical screening examination/treatment/procedure(s) were conducted as a shared visit with non-physician practitioner(s) and myself.  I personally evaluated the patient during the encounter  Please see my separate respective documentation pertaining to this patient encounter   Johnna Acosta, MD 10/27/13 [phone]

## 2013-11-01 ENCOUNTER — Encounter (HOSPITAL_COMMUNITY): Payer: Self-pay | Admitting: Emergency Medicine

## 2013-11-01 ENCOUNTER — Inpatient Hospital Stay (HOSPITAL_COMMUNITY)
Admission: EM | Admit: 2013-11-01 | Discharge: 2013-11-05 | DRG: 392 | Disposition: A | Discharge status: Home / Self Care | Payer: BC Managed Care – PPO | Attending: Family Medicine | Admitting: Family Medicine

## 2013-11-01 DIAGNOSIS — K219 Gastro-esophageal reflux disease without esophagitis: Secondary | ICD-10-CM | POA: Diagnosis present

## 2013-11-01 DIAGNOSIS — K5732 Diverticulitis of large intestine without perforation or abscess without bleeding: Secondary | ICD-10-CM | POA: Diagnosis not present

## 2013-11-01 DIAGNOSIS — F17200 Nicotine dependence, unspecified, uncomplicated: Secondary | ICD-10-CM | POA: Diagnosis present

## 2013-11-01 DIAGNOSIS — Z23 Encounter for immunization: Secondary | ICD-10-CM

## 2013-11-01 DIAGNOSIS — I129 Hypertensive chronic kidney disease with stage 1 through stage 4 chronic kidney disease, or unspecified chronic kidney disease: Secondary | ICD-10-CM | POA: Diagnosis present

## 2013-11-01 DIAGNOSIS — I10 Essential (primary) hypertension: Secondary | ICD-10-CM | POA: Diagnosis present

## 2013-11-01 DIAGNOSIS — K5792 Diverticulitis of intestine, part unspecified, without perforation or abscess without bleeding: Secondary | ICD-10-CM | POA: Diagnosis present

## 2013-11-01 DIAGNOSIS — N182 Chronic kidney disease, stage 2 (mild): Secondary | ICD-10-CM | POA: Diagnosis present

## 2013-11-01 DIAGNOSIS — D72829 Elevated white blood cell count, unspecified: Secondary | ICD-10-CM | POA: Diagnosis present

## 2013-11-01 DIAGNOSIS — R1032 Left lower quadrant pain: Secondary | ICD-10-CM | POA: Diagnosis not present

## 2013-11-01 DIAGNOSIS — Z79899 Other long term (current) drug therapy: Secondary | ICD-10-CM

## 2013-11-01 DIAGNOSIS — N179 Acute kidney failure, unspecified: Secondary | ICD-10-CM | POA: Diagnosis present

## 2013-11-01 HISTORY — DX: Headache: R51

## 2013-11-01 LAB — CBC WITH DIFFERENTIAL/PLATELET
BASOS ABS: 0 10*3/uL (ref 0.0–0.1)
Basophils Relative: 0 % (ref 0–1)
Eosinophils Absolute: 0.1 10*3/uL (ref 0.0–0.7)
Eosinophils Relative: 1 % (ref 0–5)
HCT: 37.7 % (ref 36.0–46.0)
Hemoglobin: 12.6 g/dL (ref 12.0–15.0)
LYMPHS ABS: 2.3 10*3/uL (ref 0.7–4.0)
LYMPHS PCT: 21 % (ref 12–46)
MCH: 29.8 pg (ref 26.0–34.0)
MCHC: 33.4 g/dL (ref 30.0–36.0)
MCV: 89.1 fL (ref 78.0–100.0)
Monocytes Absolute: 1.8 10*3/uL — ABNORMAL HIGH (ref 0.1–1.0)
Monocytes Relative: 16 % — ABNORMAL HIGH (ref 3–12)
NEUTROS ABS: 6.6 10*3/uL (ref 1.7–7.7)
Neutrophils Relative %: 62 % (ref 43–77)
PLATELETS: 405 10*3/uL — AB (ref 150–400)
RBC: 4.23 MIL/uL (ref 3.87–5.11)
RDW: 12.4 % (ref 11.5–15.5)
WBC: 10.8 10*3/uL — AB (ref 4.0–10.5)

## 2013-11-01 LAB — COMPREHENSIVE METABOLIC PANEL
ALK PHOS: 42 U/L (ref 39–117)
ALT: 25 U/L (ref 0–35)
AST: 31 U/L (ref 0–37)
Albumin: 4.1 g/dL (ref 3.5–5.2)
Anion gap: 12 (ref 5–15)
BUN: 17 mg/dL (ref 6–23)
CHLORIDE: 99 meq/L (ref 96–112)
CO2: 29 mEq/L (ref 19–32)
Calcium: 10.3 mg/dL (ref 8.4–10.5)
Creatinine, Ser: 1.2 mg/dL — ABNORMAL HIGH (ref 0.50–1.10)
GFR, EST AFRICAN AMERICAN: 63 mL/min — AB (ref >90)
GFR, EST NON AFRICAN AMERICAN: 54 mL/min — AB (ref >90)
GLUCOSE: 118 mg/dL — AB (ref 70–99)
POTASSIUM: 4.4 meq/L (ref 3.7–5.3)
SODIUM: 140 meq/L (ref 137–147)
Total Bilirubin: 0.3 mg/dL (ref 0.3–1.2)
Total Protein: 8 g/dL (ref 6.0–8.3)

## 2013-11-01 LAB — I-STAT CG4 LACTIC ACID, ED: LACTIC ACID, VENOUS: 0.75 mmol/L (ref 0.5–2.2)

## 2013-11-01 LAB — POC URINE PREG, ED: Preg Test, Ur: NEGATIVE

## 2013-11-01 LAB — LIPASE, BLOOD: Lipase: 32 U/L (ref 11–59)

## 2013-11-01 MED ORDER — ONDANSETRON HCL 4 MG/2ML IJ SOLN
4.0000 mg | Freq: Once | INTRAMUSCULAR | Status: AC
Start: 2013-11-01 — End: 2013-11-01
  Administered 2013-11-01: 4 mg via INTRAVENOUS
  Filled 2013-11-01: qty 2

## 2013-11-01 MED ORDER — SODIUM CHLORIDE 0.9 % IV BOLUS (SEPSIS)
1000.0000 mL | Freq: Once | INTRAVENOUS | Status: AC
  Administered 2013-11-01: 1000 mL via INTRAVENOUS

## 2013-11-01 MED ORDER — MORPHINE SULFATE 4 MG/ML IJ SOLN
4.0000 mg | Freq: Once | INTRAMUSCULAR | Status: AC
  Administered 2013-11-01: 4 mg via INTRAVENOUS
  Filled 2013-11-01: qty 1

## 2013-11-01 NOTE — ED Notes (Signed)
Pt c/o fever, chills. 100.2 temp at home. Pt also c/o abdominal pain, denies n/d, or constipation, dysuria, hematuria.

## 2013-11-01 NOTE — ED Provider Notes (Signed)
CSN: [id number]     Arrival date & time 11/01/13  1953 History   First MD Initiated Contact with Patient 11/01/13 2209     Chief Complaint  Patient presents with  . Fever  . Chills  . Abdominal Pain   Patient is a 43 y.o. female presenting with fever and abdominal pain. The history is provided by the patient.  Fever Max temp prior to arrival:  100.77F Temp source:  Oral Associated symptoms: nausea (resolved)   Associated symptoms: no chest pain, no chills, no cough, no diarrhea, no dysuria, no rash and no vomiting   Abdominal Pain Pain location:  LLQ Pain quality: sharp   Pain radiates to:  Does not radiate Timing:  Intermittent Progression:  Resolved Worsened by:  Movement and palpation Ineffective treatments: improves after taking cipro flagyl, has not taken antipyertics. Associated symptoms: fever, flatus and nausea (resolved)   Associated symptoms: no anorexia, no chest pain, no chills, no constipation, no cough, no diarrhea, no dysuria, no hematemesis, no hematochezia, no hematuria, no shortness of breath, no vaginal bleeding, no vaginal discharge and no vomiting   Risk factors: multiple surgeries   Risk factors: no NSAID use     Hx of diverticulitis with exacerbation diagnosed on 9/7 Past Medical History  Diagnosis Date  . History of syphilis   . Breast fibroadenoma   . History of ectopic pregnancy 2005    r salpyngectomy  . History of conization of cervix 2000  . H/O myomectomy 07-2006    robotic  . History of hysterectomy, supracervical 08-2008    robotic  . PMS (premenstrual syndrome)   . Back pain   . H/O sinusitis   . GERD (gastroesophageal reflux disease)   . Hx of herpes simplex type 2 infection   . History of syphilis   . Abnormal Pap smear 2000    Cone Biopsy  . H/O metrorrhagia 12/2006  . Hypertension    Past Surgical History  Procedure Laterality Date  . Cervical cone biopsy  2000  . Ectopic pregnancy surgery  2005    R salpyngectomy  . Myomectomy   2008    robotic  . Robotic assisted lap vaginal hysterectomy  2010    supracervical  . Polypectomy  2009  . Salpingectomy  2005  . Dilation and curettage of uterus  2009  . Abdominal hysterectomy    . Breast surgery  2004    fibroademoma  . Breast lumpectomy  10/11    rt-neg  . Trigger finger release Right 09/28/2012    Procedure: RELEASE TRIGGER FINGER/A-1 PULLEY RIGHT THUMB;  Surgeon: Jolyn Nap, MD;  Location: [facility];  Service: Orthopedics;  Laterality: Right;   Family History  Problem Relation Age of Onset  . Hypertension Paternal Grandmother   . Diabetes Paternal Grandmother   . Hypertension Maternal Grandmother   . Sarcoidosis Mother   . Hypertension Mother   . Thyroid disease Maternal Aunt   . Mental illness Cousin    History  Substance Use Topics  . Smoking status: Current Every Day Smoker -- 0.25 packs/day for 15 years  . Smokeless tobacco: Never Used  . Alcohol Use: 1.5 oz/week    3 drink(s) per week   OB History   Grav Para Term Preterm Abortions TAB SAB Ect Mult Living   4 2 2  2 1  0 1 0 2     Review of Systems  Constitutional: Positive for fever. Negative for chills.  Respiratory: Negative for  cough, shortness of breath and wheezing.   Cardiovascular: Negative for chest pain.  Gastrointestinal: Positive for nausea (resolved), abdominal pain and flatus. Negative for vomiting, diarrhea, constipation, hematochezia, anorexia and hematemesis.  Genitourinary: Negative for dysuria, hematuria, vaginal bleeding and vaginal discharge.  Musculoskeletal: Negative for back pain.  Skin: Negative for rash.  All other systems reviewed and are negative.     Allergies  Keflex and Penicillins  Home Medications   Prior to Admission medications   Medication Sig Start Date End Date Taking? Authorizing Provider  ciprofloxacin (CIPRO) 500 MG tablet Take 1 tablet (500 mg total) by mouth 2 (two) times daily. 10/25/13   Verl Dicker, PA-C   HYDROcodone-acetaminophen (NORCO/VICODIN) 5-325 MG per tablet Take 2 tablets by mouth every 4 (four) hours as needed. 10/25/13   Viona Gilmore Cartner, PA-C  ibuprofen (ADVIL,MOTRIN) 800 MG tablet Take 800 mg by mouth every 8 (eight) hours as needed for moderate pain.    Historical Provider, MD  lisinopril (PRINIVIL,ZESTRIL) 10 MG tablet Take 10 mg by mouth daily.    Historical Provider, MD  metroNIDAZOLE (FLAGYL) 500 MG tablet Take 1 tablet (500 mg total) by mouth 3 (three) times daily. 10/25/13   Verl Dicker, PA-C  Multiple Vitamins-Minerals (HAIR/SKIN/NAILS/BIOTIN PO) Take 2 tablets by mouth daily.    Historical Provider, MD   BP 140/82  Pulse 118  Temp(Src) 98.9 F (37.2 C) (Oral)  Resp 20  Ht 5\' 4"  (1.626 m)  Wt 189 lb (85.73 kg)  BMI 32.43 kg/m2  SpO2 97% Physical Exam  Nursing note and vitals reviewed. Constitutional: She is oriented to person, place, and time. She appears well-developed and well-nourished. No distress.  Comfortable appearing  HENT:  Head: Normocephalic and atraumatic.  Nose: Nose normal.  Eyes: Conjunctivae are normal.  Neck: Normal range of motion. Neck supple. No tracheal deviation present.  Cardiovascular: Normal rate, regular rhythm and normal heart sounds.   No murmur heard. Pulmonary/Chest: Effort normal and breath sounds normal. No respiratory distress. She has no rales.  Abdominal: Soft. Bowel sounds are normal. She exhibits no distension and no mass. There is tenderness (LLQ). There is no rebound and no guarding.  Musculoskeletal: Normal range of motion. She exhibits no edema.  Neurological: She is alert and oriented to person, place, and time.  Skin: Skin is warm and dry. No rash noted. She is not diaphoretic.  Psychiatric: She has a normal mood and affect.    ED Course  Procedures (including critical care time) Labs Review Labs Reviewed  CBC WITH DIFFERENTIAL - Abnormal; Notable for the following:    WBC 10.8 (*)    Platelets 405 (*)     Monocytes Relative 16 (*)    Monocytes Absolute 1.8 (*)    All other components within normal limits  COMPREHENSIVE METABOLIC PANEL - Abnormal; Notable for the following:    Glucose, Bld 118 (*)    Creatinine, Ser 1.20 (*)    GFR calc non Af Amer 54 (*)    GFR calc Af Amer 63 (*)    All other components within normal limits  URINALYSIS, ROUTINE W REFLEX MICROSCOPIC - Abnormal; Notable for the following:    Leukocytes, UA TRACE (*)    All other components within normal limits  URINE MICROSCOPIC-ADD ON - Abnormal; Notable for the following:    Squamous Epithelial / LPF FEW (*)    Bacteria, UA FEW (*)    All other components within normal limits  LIPASE, BLOOD  I-STAT CG4  LACTIC ACID, ED  POC URINE PREG, ED    Imaging Review No results found.   EKG Interpretation None      MDM   Final diagnoses:  Diverticulitis of colon  AKI (acute kidney injury)     Pt with recent uncomplicated diverticulitis diagnosis who presents for persistent intermittent LLQ pain and fever at home. Comfortable appearing. TTP to LLQ but no signs of peritonitis or systemic toxicity to suggest surgical emergency.  Given persistence of sx with new fever despite abx, concern for abscess, phelgmon or walled off perforation. CT abd pelvis.  Now with AKI, likely from poor PO intake today.  Hydrated.  Analgesia provided.   CT pending at time of patient care transfer to Dr. Darl Householder.  Tammy Sours, MD 11/02/13 [phone]

## 2013-11-01 NOTE — ED Provider Notes (Signed)
Pt with hx of diverticulitis as seen on CT last week - presents with recurrent pain in the lower abdomen which was acutely worse today - she had associated vomiting earlier in the day, has had poor oral intake and has a fever of 100.6 as measured prior to arrival. On my exam the patient has a mild tachycardia with a pulse of 105, tenderness in the abdomen in the left lower quadrant as well as the suprapubic area area. No peritoneal signs.  Labs show no sig Lekocytosis, exam concerning for complication of diverticulitis - CT to r/o perf / abscess.  Pain meds, fluids, nausea meds.  AT change of shift - care signed out to Dr. Darl Householder  I saw and evaluated the patient, reviewed the resident's note and I agree with the findings and plan.  Final diagnoses:  None     Johnna Acosta, MD 11/02/13 1007

## 2013-11-01 NOTE — ED Notes (Signed)
Pt monitored by pulse ox, bp cuff, and 5-lead. 

## 2013-11-02 ENCOUNTER — Encounter (HOSPITAL_COMMUNITY): Payer: Self-pay

## 2013-11-02 ENCOUNTER — Emergency Department (HOSPITAL_COMMUNITY): Payer: BC Managed Care – PPO

## 2013-11-02 DIAGNOSIS — I10 Essential (primary) hypertension: Secondary | ICD-10-CM

## 2013-11-02 DIAGNOSIS — F17200 Nicotine dependence, unspecified, uncomplicated: Secondary | ICD-10-CM | POA: Diagnosis present

## 2013-11-02 DIAGNOSIS — D72829 Elevated white blood cell count, unspecified: Secondary | ICD-10-CM

## 2013-11-02 DIAGNOSIS — N179 Acute kidney failure, unspecified: Secondary | ICD-10-CM | POA: Diagnosis present

## 2013-11-02 DIAGNOSIS — R1032 Left lower quadrant pain: Secondary | ICD-10-CM | POA: Diagnosis present

## 2013-11-02 DIAGNOSIS — I129 Hypertensive chronic kidney disease with stage 1 through stage 4 chronic kidney disease, or unspecified chronic kidney disease: Secondary | ICD-10-CM | POA: Diagnosis present

## 2013-11-02 DIAGNOSIS — K5792 Diverticulitis of intestine, part unspecified, without perforation or abscess without bleeding: Secondary | ICD-10-CM | POA: Diagnosis present

## 2013-11-02 DIAGNOSIS — Z23 Encounter for immunization: Secondary | ICD-10-CM | POA: Diagnosis not present

## 2013-11-02 DIAGNOSIS — K5732 Diverticulitis of large intestine without perforation or abscess without bleeding: Principal | ICD-10-CM

## 2013-11-02 DIAGNOSIS — Z79899 Other long term (current) drug therapy: Secondary | ICD-10-CM | POA: Diagnosis not present

## 2013-11-02 DIAGNOSIS — N182 Chronic kidney disease, stage 2 (mild): Secondary | ICD-10-CM | POA: Diagnosis present

## 2013-11-02 DIAGNOSIS — K219 Gastro-esophageal reflux disease without esophagitis: Secondary | ICD-10-CM | POA: Diagnosis present

## 2013-11-02 LAB — COMPREHENSIVE METABOLIC PANEL
ALBUMIN: 3.6 g/dL (ref 3.5–5.2)
ALBUMIN: 3.6 g/dL (ref 3.5–5.2)
ALT: 20 U/L (ref 0–35)
ALT: 20 U/L (ref 0–35)
ANION GAP: 11 (ref 5–15)
AST: 23 U/L (ref 0–37)
AST: 23 U/L (ref 0–37)
Alkaline Phosphatase: 38 U/L — ABNORMAL LOW (ref 39–117)
Alkaline Phosphatase: 40 U/L (ref 39–117)
Anion gap: 12 (ref 5–15)
BILIRUBIN TOTAL: 0.5 mg/dL (ref 0.3–1.2)
BUN: 13 mg/dL (ref 6–23)
BUN: 12 mg/dL (ref 6–23)
CHLORIDE: 107 meq/L (ref 96–112)
CO2: 28 mEq/L (ref 19–32)
CO2: 26 mEq/L (ref 19–32)
CREATININE: 1.1 mg/dL (ref 0.50–1.10)
CREATININE: 1.02 mg/dL (ref 0.50–1.10)
Calcium: 9.6 mg/dL (ref 8.4–10.5)
Calcium: 9.5 mg/dL (ref 8.4–10.5)
Chloride: 107 mEq/L (ref 96–112)
GFR calc Af Amer: 70 mL/min — ABNORMAL LOW (ref >90)
GFR calc Af Amer: 76 mL/min — ABNORMAL LOW (ref >90)
GFR calc non Af Amer: 60 mL/min — ABNORMAL LOW (ref >90)
GFR calc non Af Amer: 66 mL/min — ABNORMAL LOW (ref >90)
Glucose, Bld: 111 mg/dL — ABNORMAL HIGH (ref 70–99)
Glucose, Bld: 97 mg/dL (ref 70–99)
Potassium: 4.2 mEq/L (ref 3.7–5.3)
Potassium: 4.1 mEq/L (ref 3.7–5.3)
SODIUM: 145 meq/L (ref 137–147)
Sodium: 146 mEq/L (ref 137–147)
TOTAL PROTEIN: 7 g/dL (ref 6.0–8.3)
Total Bilirubin: 0.5 mg/dL (ref 0.3–1.2)
Total Protein: 7.1 g/dL (ref 6.0–8.3)

## 2013-11-02 LAB — CBC WITH DIFFERENTIAL/PLATELET
BASOS ABS: 0 10*3/uL (ref 0.0–0.1)
Basophils Relative: 0 % (ref 0–1)
Eosinophils Absolute: 0.1 10*3/uL (ref 0.0–0.7)
Eosinophils Relative: 1 % (ref 0–5)
HCT: 35.6 % — ABNORMAL LOW (ref 36.0–46.0)
Hemoglobin: 11.9 g/dL — ABNORMAL LOW (ref 12.0–15.0)
LYMPHS PCT: 26 % (ref 12–46)
Lymphs Abs: 2.3 10*3/uL (ref 0.7–4.0)
MCH: 30.6 pg (ref 26.0–34.0)
MCHC: 33.4 g/dL (ref 30.0–36.0)
MCV: 91.5 fL (ref 78.0–100.0)
Monocytes Absolute: 1.4 10*3/uL — ABNORMAL HIGH (ref 0.1–1.0)
Monocytes Relative: 15 % — ABNORMAL HIGH (ref 3–12)
NEUTROS ABS: 5.1 10*3/uL (ref 1.7–7.7)
Neutrophils Relative %: 58 % (ref 43–77)
PLATELETS: 358 10*3/uL (ref 150–400)
RBC: 3.89 MIL/uL (ref 3.87–5.11)
RDW: 12.7 % (ref 11.5–15.5)
WBC: 8.9 10*3/uL (ref 4.0–10.5)

## 2013-11-02 LAB — URINALYSIS, ROUTINE W REFLEX MICROSCOPIC
Bilirubin Urine: NEGATIVE
Glucose, UA: NEGATIVE mg/dL
HGB URINE DIPSTICK: NEGATIVE
KETONES UR: NEGATIVE mg/dL
Nitrite: NEGATIVE
PH: 5 (ref 5.0–8.0)
PROTEIN: NEGATIVE mg/dL
Specific Gravity, Urine: 1.011 (ref 1.005–1.030)
Urobilinogen, UA: 0.2 mg/dL (ref 0.0–1.0)

## 2013-11-02 LAB — MAGNESIUM: MAGNESIUM: 2.1 mg/dL (ref 1.5–2.5)

## 2013-11-02 LAB — URINE MICROSCOPIC-ADD ON

## 2013-11-02 LAB — PROTIME-INR
INR: 1.25 (ref 0.00–1.49)
Prothrombin Time: 15.7 seconds — ABNORMAL HIGH (ref 11.6–15.2)

## 2013-11-02 LAB — PHOSPHORUS: PHOSPHORUS: 4.1 mg/dL (ref 2.3–4.6)

## 2013-11-02 LAB — APTT: APTT: 32 s (ref 24–37)

## 2013-11-02 MED ORDER — ONDANSETRON HCL 4 MG PO TABS: 4.0000 mg | ORAL_TABLET | Freq: Four times a day (QID) | ORAL | Status: DC | PRN

## 2013-11-02 MED ORDER — IOHEXOL 300 MG/ML  SOLN
100.0000 mL | Freq: Once | INTRAMUSCULAR | Status: AC | PRN
  Administered 2013-11-02: 100 mL via INTRAVENOUS

## 2013-11-02 MED ORDER — INFLUENZA VAC SPLIT QUAD 0.5 ML IM SUSY
0.5000 mL | PREFILLED_SYRINGE | INTRAMUSCULAR | Status: AC
  Administered 2013-11-03: 0.5 mL via INTRAMUSCULAR
  Filled 2013-11-02: qty 0.5

## 2013-11-02 MED ORDER — ACETAMINOPHEN 650 MG RE SUPP: 650.0000 mg | Freq: Four times a day (QID) | RECTAL | Status: DC | PRN

## 2013-11-02 MED ORDER — ACETAMINOPHEN 325 MG PO TABS
650.0000 mg | ORAL_TABLET | Freq: Four times a day (QID) | ORAL | Status: DC | PRN
  Administered 2013-11-02: 650 mg via ORAL
  Filled 2013-11-02: qty 2

## 2013-11-02 MED ORDER — MORPHINE SULFATE 2 MG/ML IJ SOLN
1.0000 mg | INTRAMUSCULAR | Status: DC | PRN
  Administered 2013-11-02 (×2): 1 mg via INTRAVENOUS
  Filled 2013-11-02 (×2): qty 1

## 2013-11-02 MED ORDER — SODIUM CHLORIDE 0.9 % IV SOLN
INTRAVENOUS | Status: AC
  Administered 2013-11-02: 08:00:00 via INTRAVENOUS

## 2013-11-02 MED ORDER — LISINOPRIL 10 MG PO TABS
10.0000 mg | ORAL_TABLET | Freq: Every day | ORAL | Status: DC
  Administered 2013-11-02 – 2013-11-05 (×4): 10 mg via ORAL
  Filled 2013-11-02 (×5): qty 1

## 2013-11-02 MED ORDER — ONDANSETRON HCL 4 MG/2ML IJ SOLN
4.0000 mg | Freq: Four times a day (QID) | INTRAMUSCULAR | Status: DC | PRN
  Administered 2013-11-04: 4 mg via INTRAVENOUS
  Filled 2013-11-02: qty 2

## 2013-11-02 MED ORDER — HYDROCODONE-ACETAMINOPHEN 10-325 MG PO TABS
1.0000 | ORAL_TABLET | ORAL | Status: DC | PRN
  Administered 2013-11-03 – 2013-11-05 (×6): 1 via ORAL
  Filled 2013-11-02 (×6): qty 1

## 2013-11-02 MED ORDER — SODIUM CHLORIDE 0.9 % IV SOLN
500.0000 mg | Freq: Three times a day (TID) | INTRAVENOUS | Status: DC
  Administered 2013-11-02 – 2013-11-03 (×4): 500 mg via INTRAVENOUS
  Filled 2013-11-02 (×7): qty 500

## 2013-11-02 MED ORDER — SODIUM CHLORIDE 0.9 % IV SOLN
500.0000 mg | Freq: Once | INTRAVENOUS | Status: AC
  Administered 2013-11-02: 500 mg via INTRAVENOUS
  Filled 2013-11-02: qty 500

## 2013-11-02 NOTE — ED Provider Notes (Signed)
Medical screening examination/treatment/procedure(s) were conducted as a shared visit with non-physician practitioner(s) and myself.  I personally evaluated the patient during the encounter  Please see my separate respective documentation pertaining to this patient encounter   Johnna Acosta, MD 11/02/13 1007

## 2013-11-02 NOTE — Progress Notes (Signed)
42yo female c/o abdominal pain, fever, chills, and temp to 100.2 at home, CT reveals acute diverticulitis, to begin IV ABX.  Will start Primaxin 500mg  IV Q8H for wt 85kg and CrCl ~60 ml/min and monitor CBC and Cx.  Wynona Neat, PharmD, BCPS 11/02/2013 2:10 AM

## 2013-11-02 NOTE — Progress Notes (Signed)
Patient arrived from ED and made comfortable in bed. Admission RN called.

## 2013-11-02 NOTE — ED Provider Notes (Signed)
  Physical Exam  BP 125/72  Pulse 97  Temp(Src) 98.4 F (36.9 C) (Oral)  Resp 18  Ht 5\' 4"  (1.626 m)  Wt 189 lb (85.73 kg)  BMI 32.43 kg/m2  SpO2 98%  Physical Exam  ED Course  Procedures  Care assumed at sign out. Sign out pending CT. CT showed diverticulitis with some inflammation. Offered admission vs d/c with different abx. Patient states that she has been having fevers and worsening pain and would like to be admitted. Will admit to med/surg.      Wandra Arthurs, MD 11/02/13 [phone]

## 2013-11-02 NOTE — Progress Notes (Signed)
Received report from ED on pt.  

## 2013-11-02 NOTE — H&P (Addendum)
[facility] History and Physical  [name] ZOX:[id number] DOB: [birth year] DOA: 11/01/2013  Referring physician: ER physician PCP: Freddie Apley, MD   Chief Complaint: Abdominal pain  HPI:  43 year old female with history of diverticulitis and recently seen in ED 08/24/2013 for lower abdominal pain and fevers. He was ultimately sent home. She was on Cipro and Flagyl. She continued to have worsening left lower quadrant abdominal pain, fever at home and poor by mouth intake. Patient describes pain in left lower abdominal area, 7/10 in intensity, radiating to the suprapubic area. Pain is at this time 3/10 after patient has received analgesia in ED. Patient reports associated nausea and has had one episode of vomiting this morning. No diarrhea or constipation. No blood per rectum. No chest pain, shortness of breath or palpitations. No lightheadedness or loss of consciousness. In ED, vitals are stable. Blood pressure is 112/48, heart rate 94, T max 98.9 F and oxygen saturation 96% on room air. Blood work revealed mild leukocytosis of 10.8 and creatinine of 1.20. CT abdomen with contrast showed mild acute diverticulitis along the proximal sigmoid colon without evidence of perforation or abscess. She was started on Primaxin in ED and admitted for further management of diverticulitis.   Assessment & Plan    Principal Problem:   Diverticulitis / leukocytosis  CT abdomen showed mild acute diverticulitis along the proximal sigmoid colon without evidence of perforation or abscess.  Patient was started on Primaxin in ED. We'll continue this antibiotic for now.   If no significant improvement may need a GI consultation  Continue to provide supportive care with IV fluids, analgesia as needed, antiemetics as needed. Keep n.p.o. for now. Active Problems:   HTN (hypertension)  Lisinopril due to acute renal failure. Blood pressure is stable and at goal at this time.   Acute renal  failure  Likely secondary to lisinopril which was put on hold.  Continue IV fluids. Followup BMP in the morning.   DVT prophylaxis:   SCDs bilaterally  Radiological Exams on Admission:  Ct Abdomen Pelvis W Contrast 11/02/2013 1. Mild acute diverticulitis along the proximal sigmoid colon, with focal soft tissue inflammation and trace fluid. This is mildly worsened from the recent prior study. No evidence of perforation or abscess formation at this time. Mild inflammation extends about the adjacent distal left ureter. 2. Otherwise unremarkable contrast-enhanced CT of the abdomen and pelvis.   Electronically Signed   By: Garald Balding M.D.   On: 11/02/2013 01:23     Code Status: Full Family Communication: Plan of care discussed with the patient  Disposition Plan: Admit for further evaluation  Leisa Lenz, MD  [facility] Pager [phone]  Review of Systems:  Constitutional: Negative for fever, chills and malaise/fatigue. Negative for diaphoresis.  HENT: Negative for hearing loss, ear pain, nosebleeds, congestion, sore throat, neck pain, tinnitus and ear discharge.   Eyes: Negative for blurred vision, double vision, photophobia, pain, discharge and redness.  Respiratory: Negative for cough, hemoptysis, sputum production, shortness of breath, wheezing and stridor.   Cardiovascular: Negative for chest pain, palpitations, orthopnea, claudication and leg swelling.  Gastrointestinal: per HPI.  Genitourinary: Negative for dysuria, urgency, frequency, hematuria and flank pain.  Musculoskeletal: Negative for myalgias, back pain, joint pain and falls.  Skin: Negative for itching and rash.  Neurological: Negative for dizziness and weakness. Negative for tingling, tremors, sensory change, speech change, focal weakness, loss of consciousness and headaches.  Endo/Heme/Allergies: Negative for environmental allergies and polydipsia. Does not bruise/bleed easily.  Psychiatric/Behavioral:  Negative for suicidal ideas. The patient is not nervous/anxious.      Past Medical History  Diagnosis Date  . History of syphilis   . Breast fibroadenoma   . History of ectopic pregnancy 2005    r salpyngectomy  . History of conization of cervix 2000  . H/O myomectomy 07-2006    robotic  . History of hysterectomy, supracervical 08-2008    robotic  . PMS (premenstrual syndrome)   . Back pain   . H/O sinusitis   . GERD (gastroesophageal reflux disease)   . Hx of herpes simplex type 2 infection   . History of syphilis   . Abnormal Pap smear 2000    [facility] Biopsy  . H/O metrorrhagia 12/2006  . Hypertension    Past Surgical History  Procedure Laterality Date  . Cervical cone biopsy  2000  . Ectopic pregnancy surgery  2005    R salpyngectomy  . Myomectomy  2008    robotic  . Robotic assisted lap vaginal hysterectomy  2010    supracervical  . Polypectomy  2009  . Salpingectomy  2005  . Dilation and curettage of uterus  2009  . Abdominal hysterectomy    . Breast surgery  2004    fibroademoma  . Breast lumpectomy  10/11    rt-neg  . Trigger finger release Right 09/28/2012    Procedure: RELEASE TRIGGER FINGER/A-1 PULLEY RIGHT THUMB;  Surgeon: Jolyn Nap, MD;  Location: [facility];  Service: Orthopedics;  Laterality: Right;   Social History:  reports that she has been smoking.  She has never used smokeless tobacco. She reports that she drinks about 1.5 ounces of alcohol per week. She reports that she does not use illicit drugs.  Allergies  Allergen Reactions  . Keflex [Cephalexin] Anaphylaxis  . Penicillins Anaphylaxis    Family History:  Family History  Problem Relation Age of Onset  . Hypertension Paternal Grandmother   . Diabetes Paternal Grandmother   . Hypertension Maternal Grandmother   . Sarcoidosis Mother   . Hypertension Mother   . Thyroid disease Maternal Aunt   . Mental illness Cousin      Prior to Admission medications   Medication  Sig Start Date End Date Taking? Authorizing Provider  ciprofloxacin (CIPRO) 500 MG tablet Take 500 mg by mouth 2 (two) times daily. Started medication on 10-26-13 10/25/13  Yes Viona Gilmore Cartner, PA-C  ibuprofen (ADVIL,MOTRIN) 800 MG tablet Take 800 mg by mouth every 8 (eight) hours as needed for moderate pain.   Yes Historical Provider, MD  lisinopril (PRINIVIL,ZESTRIL) 10 MG tablet Take 10 mg by mouth daily.   Yes Historical Provider, MD  metroNIDAZOLE (FLAGYL) 500 MG tablet Take 500 mg by mouth 3 (three) times daily. Started medication on 10-26-13 10/25/13  Yes Viona Gilmore Cartner, PA-C  Multiple Vitamins-Minerals (HAIR/SKIN/NAILS/BIOTIN PO) Take 2 tablets by mouth daily.   Yes Historical Provider, MD   Physical Exam: Filed Vitals:   11/02/13 0024 11/02/13 0030 11/02/13 0100 11/02/13 0130  BP:  112/48 141/68 125/72  Pulse:  94 103 97  Temp: 98.4 F (36.9 C)     TempSrc: Oral     Resp:  15 16 18   Height:      Weight:      SpO2:  96% 99% 98%    Physical Exam  Constitutional: Appears well-developed and well-nourished. No distress.  HENT: Normocephalic. No tonsillar erythema or exudates Eyes: Conjunctivae and EOM are normal. PERRLA, no scleral  icterus.  Neck: Normal ROM. Neck supple. No JVD. No tracheal deviation. No thyromegaly.  CVS: RRR, S1/S2 +, no murmurs, no gallops, no carotid bruit.  Pulmonary: Effort and breath sounds normal, no stridor, rhonchi, wheezes, rales.  Abdominal: Soft. BS +,  no distension, tenderness in lower abdomen to palpation, no rebound or guarding.  Musculoskeletal: Normal range of motion. No edema and no tenderness.  Lymphadenopathy: No lymphadenopathy noted, cervical, inguinal. Neuro: Alert. Normal reflexes, muscle tone coordination. No focal neurologic deficits. Skin: Skin is warm and dry. No rash noted. Not diaphoretic. No erythema. No pallor.  Psychiatric: Normal mood and affect. Behavior, judgment, thought content normal.   Labs on Admission:  Basic  Metabolic Panel:  Recent Labs Lab 11/01/13 2113  NA 140  K 4.4  CL 99  CO2 29  GLUCOSE 118*  BUN 17  CREATININE 1.20*  CALCIUM 10.3   Liver Function Tests:  Recent Labs Lab 11/01/13 2113  AST 31  ALT 25  ALKPHOS 42  BILITOT 0.3  PROT 8.0  ALBUMIN 4.1    Recent Labs Lab 11/01/13 2113  LIPASE 32   No results found for this basename: AMMONIA,  in the last 168 hours CBC:  Recent Labs Lab 11/01/13 2113  WBC 10.8*  NEUTROABS 6.6  HGB 12.6  HCT 37.7  MCV 89.1  PLT 405*   Cardiac Enzymes: No results found for this basename: CKTOTAL, CKMB, CKMBINDEX, TROPONINI,  in the last 168 hours BNP: No components found with this basename: POCBNP,  CBG: No results found for this basename: GLUCAP,  in the last 168 hours  If 7PM-7AM, please contact night-coverage [email or web address] Password [email or web address] 11/02/2013, 2:03 AM

## 2013-11-02 NOTE — Progress Notes (Signed)
[name] [id number] Code Status: Full   Admission Data: 11/02/2013 8:00am Attending Provider:  Nada Libman BBC:[id number], Domenic Moras, MD Consults/ Treatment Team:    [name] is a 43 y.o. female patient admitted from ED awake, alert - oriented  X 3 - no acute distress noted.  VSS - Blood pressure 128/76, pulse 86, temperature 97.9 F (36.6 C), temperature source Oral, resp. rate 16, height 5\' 4"  (1.626 m), weight 85.73 kg (189 lb), SpO2 98.00%.    IV in place, occlusive dsg intact without redness.  Orientation to room, and floor completed with information packet given to patient/family.  Patient declined safety video at this time.  Admission INP armband ID verified with patient/family, and in place.   SR up x 2, fall assessment complete, with patient and family able to verbalize understanding of risk associated with falls, and verbalized understanding to call nsg before up out of bed.  Call light within reach, patient able to voice, and demonstrate understanding.  Skin, clean-dry- intact without evidence of bruising, or skin tears.   No evidence of skin break down noted on exam.     Will cont to eval and treat per MD orders.  Hadlea Furuya L, RN 11/02/2013 8:00am

## 2013-11-02 NOTE — Progress Notes (Signed)
Note: This document was prepared with digital dictation and possible smart phrase technology. Any transcriptional errors that result from this process are unintentional.   [name] [id number] DOB: Jul 05, 1970 DOA: 11/01/2013 PCP: Freddie Apley, MD  Brief narrative: 43 y/o ?, known prior Fibroids, R trigger thumb, eval 9//7/15 with abd pain and sent home Cipro/Flagyll re-presented 11/01/13 with worsening Abd pains She developed worsening abd pain and fever overnight 11/01/13.  staretd on Zosyn  Past medical history-As per Problem list Chart reviewed as below- none  Consultants:  none  Procedures:  none  Antibiotics:  Cipro 11/01/13  Flagyll 11/01/13   Subjective  5/10 abd pain Wants to eat Feels a little better today No further vomit but a little nauseous   Objective    Interim History: -  Telemetry: -   Objective: Filed Vitals:   11/02/13 0700 11/02/13 0716 11/02/13 0721 11/02/13 0804  BP:  119/87 118/59 139/73  Pulse: 93 89 84 92  Temp:   98.1 F (36.7 C) 98.4 F (36.9 C)  TempSrc:   Oral Oral  Resp:   14 16  Height:    5\' 4"  (1.626 m)  Weight:    85.73 kg (189 lb)  SpO2: 98% 99% 98% 100%   No intake or output data in the 24 hours ending 11/02/13 1209  Exam:  General: alert oriented in nad Cardiovascular: s1 s 2no m/r/g Respiratory: clear no added sound Abdomen:  Soft, slightly distended, LLQ tenderness Skin no le edema Neuro no gross motor deficit  Data Reviewed: Basic Metabolic Panel:  Recent Labs Lab 11/01/13 2113 11/02/13 0624 11/02/13 1044  NA 140 146 145  K 4.4 4.2 4.1  CL 99 107 107  CO2 29 28 26   GLUCOSE 118* 111* 97  BUN 17 13 12   CREATININE 1.20* 1.10 1.02  CALCIUM 10.3 9.6 9.5  MG  --   --  2.1  PHOS  --   --  4.1   Liver Function Tests:  Recent Labs Lab 11/01/13 2113 11/02/13 0624 11/02/13 1044  AST 31 23 23   ALT 25 20 20   ALKPHOS 42 38* 40  BILITOT 0.3 0.5 0.5  PROT 8.0 7.0 7.1  ALBUMIN 4.1  3.6 3.6    Recent Labs Lab 11/01/13 2113  LIPASE 32   No results found for this basename: AMMONIA,  in the last 168 hours CBC:  Recent Labs Lab 11/01/13 2113 11/02/13 1044  WBC 10.8* 8.9  NEUTROABS 6.6 5.1  HGB 12.6 11.9*  HCT 37.7 35.6*  MCV 89.1 91.5  PLT 405* 358   Cardiac Enzymes: No results found for this basename: CKTOTAL, CKMB, CKMBINDEX, TROPONINI,  in the last 168 hours BNP: No components found with this basename: POCBNP,  CBG: No results found for this basename: GLUCAP,  in the last 168 hours  No results found for this or any previous visit (from the past 240 hour(s)).   Studies:              All Imaging reviewed and is as per above notation   Scheduled Meds: . imipenem-cilastatin  500 mg Intravenous 3 times per day  . [START ON 11/03/2013] Influenza vac split quadrivalent PF  0.5 mL Intramuscular Tomorrow-1000   Continuous Infusions: . sodium chloride 75 mL/hr at 11/02/13 0821     Assessment/Plan:  1. Failed OP Rx Diverticulitis-continue Zosyn.  Advance to liquid diet and then soft diet by am.  Continue IV ABx until resolution. 2. Htn-re-start Lisinopril 10 daily-Renal  insuff resolved 3. AKI- super-imposed on stage 1-2 CKD-resolving  Code Status: full Family Communication: none + Disposition Plan: inpatient   Verneita Griffes, MD  [facility] Pager [phone] 11/02/2013, 12:09 PM    LOS: 1 day

## 2013-11-03 LAB — BASIC METABOLIC PANEL
ANION GAP: 10 (ref 5–15)
BUN: 9 mg/dL (ref 6–23)
CHLORIDE: 102 meq/L (ref 96–112)
CO2: 29 mEq/L (ref 19–32)
Calcium: 9.4 mg/dL (ref 8.4–10.5)
Creatinine, Ser: 0.88 mg/dL (ref 0.50–1.10)
GFR calc Af Amer: >90 mL/min (ref >90)
GFR calc non Af Amer: 79 mL/min — ABNORMAL LOW (ref >90)
Glucose, Bld: 94 mg/dL (ref 70–99)
POTASSIUM: 3.6 meq/L — AB (ref 3.7–5.3)
Sodium: 141 mEq/L (ref 137–147)

## 2013-11-03 LAB — GLUCOSE, CAPILLARY: Glucose-Capillary: 96 mg/dL (ref 70–99)

## 2013-11-03 MED ORDER — AMOXICILLIN-POT CLAVULANATE 875-125 MG PO TABS: 1.0000 | ORAL_TABLET | Freq: Two times a day (BID) | ORAL | Status: DC

## 2013-11-03 MED ORDER — SODIUM CHLORIDE 0.9 % IV SOLN
500.0000 mg | Freq: Four times a day (QID) | INTRAVENOUS | Status: AC
  Administered 2013-11-03 – 2013-11-04 (×3): 500 mg via INTRAVENOUS
  Filled 2013-11-03 (×4): qty 500

## 2013-11-03 MED ORDER — CLINDAMYCIN HCL 300 MG PO CAPS
450.0000 mg | ORAL_CAPSULE | Freq: Three times a day (TID) | ORAL | Status: DC
  Administered 2013-11-04 – 2013-11-05 (×4): 450 mg via ORAL
  Filled 2013-11-03 (×9): qty 1

## 2013-11-03 NOTE — Progress Notes (Signed)
ANTIBIOTIC CONSULT NOTE - FOLLOW UP  Pharmacy Consult for Primaxin Indication: intra-abdominal coverage  Allergies  Allergen Reactions  . Keflex [Cephalexin] Anaphylaxis  . Penicillins Anaphylaxis    Patient Measurements: Height: 5\' 4"  (162.6 cm) Weight: 187 lb 6.4 oz (85.004 kg) IBW/kg (Calculated) : 54.7  Vital Signs: Temp: 98.3 F (36.8 C) (09/16 0501) Temp src: Oral (09/16 0501) BP: 136/72 mmHg (09/16 1003) Pulse Rate: 88 (09/16 0501) Intake/Output from previous day: 09/15 0701 - 09/16 0700 In: 1647.5 [I.V.:1647.5] Out: -   Labs:  Recent Labs  11/01/13 2113 11/02/13 0624 11/02/13 1044 11/03/13 0534  WBC 10.8*  --  8.9  --   HGB 12.6  --  11.9*  --   PLT 405*  --  358  --   CREATININE 1.20* 1.10 1.02 0.88   Estimated Creatinine Clearance: 86 ml/min (by C-G formula based on Cr of 0.88).  Assessment:   Day # 2 Primaxin.  Renal function has returned to baseline. Afebrile, WBC has trended down.  Goal of Therapy:  appropriate Primaxin dose for renal function and infection  Plan:   Adjust Primaxin 500 mg IV from q8hrs to q6hrs.  Will follow for any need to re-adjust regimen.  Arty Baumgartner, [place] Pager: [phone] 11/03/2013,1:10 PM

## 2013-11-03 NOTE — Progress Notes (Signed)
Note: This document was prepared with digital dictation and possible smart phrase technology. Any transcriptional errors that result from this process are unintentional.   [name] [id number]:[id number] DOB: [birth year] DOA: 11/01/2013 PCP: Freddie Apley, MD  Brief narrative: 43 y/o ?, known prior Fibroids, R trigger thumb, eval 9//7/15 with abd pain and sent home Cipro/Flagyll re-presented 11/01/13 with worsening Abd pains She developed worsening abd pain and fever overnight 11/01/13.  started on Zosyn  Past medical history-As per Problem list Chart reviewed as below- none  Consultants:  none  Procedures:  none  Antibiotics:  Cipro 11/01/13  Flagyll 11/01/13   Subjective   Pain is much better Wants full diet Feels close to 85% of her normal self No fever nor chills nor rigors   Objective    Interim History: -  Telemetry: -   Objective: Filed Vitals:   11/02/13 2220 11/03/13 0501 11/03/13 1003 11/03/13 1441  BP: 125/59 121/62 136/72 121/69  Pulse: 88 88  85  Temp: 98.2 F (36.8 C) 98.3 F (36.8 C)  97.3 F (36.3 C)  TempSrc: Oral Oral  Oral  Resp: 16 16  16   Height:      Weight:  85.004 kg (187 lb 6.4 oz)    SpO2: 99% 100%  100%    Intake/Output Summary (Last 24 hours) at 11/03/13 1555 Last data filed at 11/03/13 2993  Gross per 24 hour  Intake 1647.5 ml  Output      0 ml  Net 1647.5 ml    Exam:  General: alert oriented in nad Cardiovascular: s1 s 2no m/r/g Respiratory: clear no added sound Abdomen:  Soft, slightly distended, LLQ tenderness Skin no le edema Neuro no gross motor deficit  Data Reviewed: Basic Metabolic Panel:  Recent Labs Lab 11/01/13 2113 11/02/13 0624 11/02/13 1044 11/03/13 0534  NA 140 146 145 141  K 4.4 4.2 4.1 3.6*  CL 99 107 107 102  CO2 29 28 26 29   GLUCOSE 118* 111* 97 94  BUN 17 13 12 9   CREATININE 1.20* 1.10 1.02 0.88  CALCIUM 10.3 9.6 9.5 9.4  MG  --   --  2.1  --   PHOS  --   --  4.1  --     Liver Function Tests:  Recent Labs Lab 11/01/13 2113 11/02/13 0624 11/02/13 1044  AST 31 23 23   ALT 25 20 20   ALKPHOS 42 38* 40  BILITOT 0.3 0.5 0.5  PROT 8.0 7.0 7.1  ALBUMIN 4.1 3.6 3.6    Recent Labs Lab 11/01/13 2113  LIPASE 32   No results found for this basename: AMMONIA,  in the last 168 hours CBC:  Recent Labs Lab 11/01/13 2113 11/02/13 1044  WBC 10.8* 8.9  NEUTROABS 6.6 5.1  HGB 12.6 11.9*  HCT 37.7 35.6*  MCV 89.1 91.5  PLT 405* 358   Cardiac Enzymes: No results found for this basename: CKTOTAL, CKMB, CKMBINDEX, TROPONINI,  in the last 168 hours BNP: No components found with this basename: POCBNP,  CBG:  Recent Labs Lab 11/03/13 0744  GLUCAP 96    No results found for this or any previous visit (from the past 240 hour(s)).   Studies:              All Imaging reviewed and is as per above notation   Scheduled Meds: . imipenem-cilastatin  500 mg Intravenous Q6H  . lisinopril  10 mg Oral Daily   Continuous Infusions:     Assessment/Plan:  1. Failed OP Rx Diverticulitis-continue Primaxin.  Advanced to full diet today. Transition in am to PO augmentin and reassess for possible d/c home-might benefit from screening colonoscopy in 8 weeks subsequently 2. Htn-re-start Lisinopril 10 daily-Renal insuff resolved 3. AKI- super-imposed on stage 1-2 CKD-resolving  Code Status: full Family Communication: none + Disposition Plan: inpatient   Verneita Griffes, MD  [facility] Pager [phone] 11/03/2013, 3:55 PM    LOS: 2 days

## 2013-11-03 NOTE — Progress Notes (Signed)
On call physician paged for shower order per pt request. Pt A&Ox4 and ambulatory.

## 2013-11-04 LAB — GLUCOSE, CAPILLARY: Glucose-Capillary: 102 mg/dL — ABNORMAL HIGH (ref 70–99)

## 2013-11-04 MED ORDER — CLINDAMYCIN HCL 150 MG PO CAPS: 450.0000 mg | ORAL_CAPSULE | Freq: Three times a day (TID) | ORAL | Status: DC

## 2013-11-04 NOTE — Progress Notes (Signed)
Note: This document was prepared with digital dictation and possible smart phrase technology. Any transcriptional errors that result from this process are unintentional.   [name] [id number] DOB: [birth year] DOA: 11/01/2013 PCP: Freddie Apley, MD  Brief narrative: 43 y/o ?, known prior Fibroids, R trigger thumb, eval 9//7/15 with abd pain and sent home Cipro/Flagyll re-presented 11/01/13 with worsening Abd pains She developed worsening abd pain and fever overnight 11/01/13.  started on Zosyn  Past medical history-As per Problem list Chart reviewed as below- none  Consultants:  none  Procedures:  none  Antibiotics:  Cipro 11/01/13  Flagyll 11/01/13   Subjective   Pain is much better Tolerating full diet but  still has abdominal pain   Objective    Interim History: -  Telemetry: -   Objective: Filed Vitals:   11/03/13 1441 11/03/13 2327 11/04/13 0601 11/04/13 1325  BP: 121/69 123/59 137/68 119/72  Pulse: 85 75 76 77  Temp: 97.3 F (36.3 C) 98.2 F (36.8 C) 97.9 F (36.6 C) 98 F (36.7 C)  TempSrc: Oral Oral Oral Oral  Resp: 16 16 16 16   Height:      Weight:   84.4 kg (186 lb 1.1 oz)   SpO2: 100% 99% 100% 99%    Intake/Output Summary (Last 24 hours) at 11/04/13 1707 Last data filed at 11/04/13 0900  Gross per 24 hour  Intake    360 ml  Output      0 ml  Net    360 ml    Exam:  General: alert oriented in nad Cardiovascular: s1 s 2no m/r/g Respiratory: clear no added sound Abdomen:  Soft, slightly distended, LLQ tenderness Skin no le edema Neuro no gross motor deficit  Data Reviewed: Basic Metabolic Panel:  Recent Labs Lab 11/01/13 2113 11/02/13 0624 11/02/13 1044 11/03/13 0534  NA 140 146 145 141  K 4.4 4.2 4.1 3.6*  CL 99 107 107 102  CO2 29 28 26 29   GLUCOSE 118* 111* 97 94  BUN 17 13 12 9   CREATININE 1.20* 1.10 1.02 0.88  CALCIUM 10.3 9.6 9.5 9.4  MG  --   --  2.1  --   PHOS  --   --  4.1  --    Liver  Function Tests:  Recent Labs Lab 11/01/13 2113 11/02/13 0624 11/02/13 1044  AST 31 23 23   ALT 25 20 20   ALKPHOS 42 38* 40  BILITOT 0.3 0.5 0.5  PROT 8.0 7.0 7.1  ALBUMIN 4.1 3.6 3.6    Recent Labs Lab 11/01/13 2113  LIPASE 32   No results found for this basename: AMMONIA,  in the last 168 hours CBC:  Recent Labs Lab 11/01/13 2113 11/02/13 1044  WBC 10.8* 8.9  NEUTROABS 6.6 5.1  HGB 12.6 11.9*  HCT 37.7 35.6*  MCV 89.1 91.5  PLT 405* 358   Cardiac Enzymes: No results found for this basename: CKTOTAL, CKMB, CKMBINDEX, TROPONINI,  in the last 168 hours BNP: No components found with this basename: POCBNP,  CBG:  Recent Labs Lab 11/03/13 0744 11/04/13 0814  GLUCAP 96 102*    No results found for this or any previous visit (from the past 240 hour(s)).   Studies:              All Imaging reviewed and is as per above notation   Scheduled Meds: . clindamycin  450 mg Oral 3 times per day  . lisinopril  10 mg Oral Daily  Continuous Infusions:     Assessment/Plan:  1. Failed OP Rx Diverticulitis-continue Primaxin.  Advanced to full diet today. Transition to by mouth clindamycin- possible discharge home in a.m.  2. Htn-re-start Lisinopril 10 daily-Renal insuff resolved 3. AKI- super-imposed on stage 1-2 CKD-resolving  Code Status: full Family Communication: none + Disposition Plan: inpatient   Verneita Griffes, MD  [facility] Pager [phone] 11/04/2013, 5:07 PM    LOS: 3 days

## 2013-11-05 ENCOUNTER — Encounter: Payer: Self-pay | Admitting: Family Medicine

## 2013-11-05 LAB — GLUCOSE, CAPILLARY: GLUCOSE-CAPILLARY: 91 mg/dL (ref 70–99)

## 2013-11-05 MED ORDER — ONDANSETRON HCL 4 MG PO TABS: 4.0000 mg | ORAL_TABLET | Freq: Four times a day (QID) | ORAL | Status: DC | PRN

## 2013-11-05 MED ORDER — HYDROCODONE-ACETAMINOPHEN 5-325 MG PO TABS: 2.0000 | ORAL_TABLET | ORAL | Status: DC | PRN

## 2013-11-05 NOTE — Progress Notes (Signed)
Patient discharge teaching given, including activity, diet, follow-up appoints, and medications. Patient verbalized understanding of all discharge instructions. IV access was d/c'd. Vitals are stable. Skin is intact except as charted in most recent assessments. Pt to be escorted out by NT, to be driven home by family. 

## 2013-11-05 NOTE — Discharge Summary (Signed)
Physician Discharge Summary  [name] XNA:[id number] DOB: [birth year] DOA: 11/01/2013  PCP: Freddie Apley, MD  Admit date: 11/01/2013 Discharge date: 11/05/2013  Time spent: 25 minutes  Recommendations for Outpatient Follow-up:  1. Complete clindamycin total ~ 7-10 days 2.  Zofra/vicodin limitedRx given on d/c 3. Work excuse letter given 4. Eventual OP colonoscopy needed  Discharge Diagnoses:  Principal Problem:   Diverticulitis Active Problems:   HTN (hypertension)   Leukocytosis   Acute renal failure   Discharge Condition: good  Diet recommendation: soft diet  Filed Weights   11/04/13 0601 11/05/13 0500 11/05/13 0637  Weight: 84.4 kg (186 lb 1.1 oz) 81 kg (178 lb 9.2 oz) 79.2 kg (174 lb 9.7 oz)    History of present illness:  43 y/o ?, known prior Fibroids, R trigger thumb, eval 9//7/15 with abd pain and sent home Cipro/Flagyll re-presented 11/01/13 with worsening Abd pains  She developed worsening abd pain and fever overnight 11/01/13. started on [facility] Course:  :  1. Failed OP Rx Diverticulitis-was on Primaxinfor 3 daysand transitioned to PO clindamycin. D/c homewith POVicodin as well as Zofran 2. Htn-re-start Lisinopril 10 daily-Renal insuff resolved 3. AKI- super-imposed on stage 1-2 CKD-resolving  Antibiotics:  Primaxin 9/14--9/16 Clindamycin 9/17-9/21  Discharge Exam: Filed Vitals:   11/05/13 1030  BP: 115/67  Pulse:   Temp:   Resp:     General: alertpleasant oriented in nad Cardiovascular: s1 s 2no m/r/g Respiratory: clear Abd pain decreased  Discharge Instructions You were cared for by a hospitalist during your hospital stay. If you have any questions about your discharge medications or the care you received while you were in the hospital after you are discharged, you can call the unit and asked to speak with the hospitalist on call if the hospitalist that took care of you is not available. Once you are discharged, your  primary care physician will handle any further medical issues. Please note that NO REFILLS for any discharge medications will be authorized once you are discharged, as it is imperative that you return to your primary care physician (or establish a relationship with a primary care physician if you do not have one) for your aftercare needs so that they can reassess your need for medications and monitor your lab values.  Discharge Instructions   Diet - low sodium heart healthy    Complete by:  As directed      Diet - low sodium heart healthy    Complete by:  As directed      Discharge instructions    Complete by:  As directed   Finish all of the antibiotics Report to nearest Ed if severe abd pain or n/v Might need eventual colonoscopy soon     Discharge instructions    Complete by:  As directed   Continue Clindamycin till 9/21 We will Rx pain meds as well as nausea meds. You should be able to return to work on 9/21 and we will prvideyou with a work note     Increase activity slowly    Complete by:  As directed      Increase activity slowly    Complete by:  As directed           Current Discharge Medication List    START taking these medications   Details  clindamycin (CLEOCIN) 150 MG capsule Take 3 capsules (450 mg total) by mouth every 8 (eight) hours. Qty: 18 capsule, Refills: 0    ondansetron (ZOFRAN)  4 MG tablet Take 1 tablet (4 mg total) by mouth every 6 (six) hours as needed for nausea. Qty: 20 tablet, Refills: 0      CONTINUE these medications which have CHANGED   Details  HYDROcodone-acetaminophen (NORCO/VICODIN) 5-325 MG per tablet Take 2 tablets by mouth every 4 (four) hours as needed. Qty: 10 tablet, Refills: 0      CONTINUE these medications which have NOT CHANGED   Details  ibuprofen (ADVIL,MOTRIN) 800 MG tablet Take 800 mg by mouth every 8 (eight) hours as needed for moderate pain.    lisinopril (PRINIVIL,ZESTRIL) 10 MG tablet Take 10 mg by mouth daily.       STOP taking these medications     ciprofloxacin (CIPRO) 500 MG tablet      metroNIDAZOLE (FLAGYL) 500 MG tablet      Multiple Vitamins-Minerals (HAIR/SKIN/NAILS/BIOTIN PO)        Allergies  Allergen Reactions  . Keflex [Cephalexin] Anaphylaxis  . Penicillins Anaphylaxis      The results of significant diagnostics from this hospitalization (including imaging, microbiology, ancillary and laboratory) are listed below for reference.    Significant Diagnostic Studies: Ct Abdomen Pelvis W Contrast  11/02/2013   CLINICAL DATA:  Left lower quadrant abdominal pain.  EXAM: CT ABDOMEN AND PELVIS WITH CONTRAST  TECHNIQUE: Multidetector CT imaging of the abdomen and pelvis was performed using the standard protocol following bolus administration of intravenous contrast.  CONTRAST:  136mL OMNIPAQUE IOHEXOL 300 MG/ML  SOLN  COMPARISON:  CT of the abdomen and pelvis from 10/25/2013  FINDINGS: Minimal bibasilar atelectasis is noted.  The liver and spleen are unremarkable in appearance. The gallbladder is within normal limits. The pancreas and adrenal glands are unremarkable.  Nonspecific perinephric stranding is noted bilaterally. The kidneys are otherwise unremarkable in appearance. There is no evidence of hydronephrosis. No renal or ureteral stones are seen.  No free fluid is identified. The small bowel is unremarkable in appearance. The stomach is within normal limits. No acute vascular abnormalities are seen. Minimal calcification is noted at the distal abdominal aorta and its branches.  The appendix is normal in caliber, without evidence for appendicitis. Minimal diverticulosis is noted along the descending and proximal sigmoid colon. There is focal soft tissue inflammation and trace fluid about diverticula at the proximal sigmoid colon, concerning for mild diverticulitis. There is no evidence of perforation or abscess formation at this time. Mild inflammation extends about the adjacent distal left  ureter.  The bladder is mildly distended and grossly unremarkable. The patient is status post hysterectomy. No suspicious adnexal masses are seen. The ovaries are unremarkable in appearance. No inguinal lymphadenopathy is seen.  No acute osseous abnormalities are identified.  IMPRESSION: 1. Mild acute diverticulitis along the proximal sigmoid colon, with focal soft tissue inflammation and trace fluid. This is mildly worsened from the recent prior study. No evidence of perforation or abscess formation at this time. Mild inflammation extends about the adjacent distal left ureter. 2. Otherwise unremarkable contrast-enhanced CT of the abdomen and pelvis.   Electronically Signed   By: Garald Balding M.D.   On: 11/02/2013 01:23   Ct Abdomen Pelvis W Contrast  10/25/2013   CLINICAL DATA:  Lower abdominal pain and history of diverticulitis.  EXAM: CT ABDOMEN AND PELVIS WITH CONTRAST  TECHNIQUE: Multidetector CT imaging of the abdomen and pelvis was performed using the standard protocol following bolus administration of intravenous contrast.  CONTRAST:  1mL OMNIPAQUE IOHEXOL 300 MG/ML  SOLN  COMPARISON:  12/02/2012  and 11/29/2012.  FINDINGS: Diverticulitis is seen involving the proximal to mid sigmoid colon with colonic wall thickening and surrounding inflammation identified extending into the pericolonic fat. Multiple diverticula are present throughout most of the colon. There is no evidence of focal abscess or extraluminal air.  No evidence of bowel obstruction. The liver, gallbladder, pancreas, spleen, adrenal glands and kidneys are within normal limits. No masses or lymphadenopathy. No hernias are identified. The bladder is unremarkable. No bony abnormalities.  IMPRESSION: Acute diverticulitis involving the proximal to mid sigmoid colon. No associated free intraperitoneal air or focal abscess.   Electronically Signed   By: Aletta Edouard M.D.   On: 10/25/2013 10:23    Microbiology: No results found for this or  any previous visit (from the past 240 hour(s)).   Labs: Basic Metabolic Panel:  Recent Labs Lab 11/01/13 2113 11/02/13 0624 11/02/13 1044 11/03/13 0534  NA 140 146 145 141  K 4.4 4.2 4.1 3.6*  CL 99 107 107 102  CO2 29 28 26 29   GLUCOSE 118* 111* 97 94  BUN 17 13 12 9   CREATININE 1.20* 1.10 1.02 0.88  CALCIUM 10.3 9.6 9.5 9.4  MG  --   --  2.1  --   PHOS  --   --  4.1  --    Liver Function Tests:  Recent Labs Lab 11/01/13 2113 11/02/13 0624 11/02/13 1044  AST 31 23 23   ALT 25 20 20   ALKPHOS 42 38* 40  BILITOT 0.3 0.5 0.5  PROT 8.0 7.0 7.1  ALBUMIN 4.1 3.6 3.6    Recent Labs Lab 11/01/13 2113  LIPASE 32   No results found for this basename: AMMONIA,  in the last 168 hours CBC:  Recent Labs Lab 11/01/13 2113 11/02/13 1044  WBC 10.8* 8.9  NEUTROABS 6.6 5.1  HGB 12.6 11.9*  HCT 37.7 35.6*  MCV 89.1 91.5  PLT 405* 358   Cardiac Enzymes: No results found for this basename: CKTOTAL, CKMB, CKMBINDEX, TROPONINI,  in the last 168 hours BNP: BNP (last 3 results) No results found for this basename: PROBNP,  in the last 8760 hours CBG:  Recent Labs Lab 11/03/13 0744 11/04/13 0814 11/05/13 0752  GLUCAP 96 102* 91       Signed:  Nita Sells  [facility] 11/05/2013, 11:18 AM

## 2013-11-16 ENCOUNTER — Other Ambulatory Visit: Payer: Self-pay

## 2013-11-16 DIAGNOSIS — Z1231 Encounter for screening mammogram for malignant neoplasm of breast: Secondary | ICD-10-CM

## 2013-11-23 ENCOUNTER — Ambulatory Visit: Payer: Self-pay

## 2013-12-07 ENCOUNTER — Other Ambulatory Visit: Payer: Self-pay | Admitting: Gastroenterology

## 2013-12-07 DIAGNOSIS — R1032 Left lower quadrant pain: Secondary | ICD-10-CM

## 2013-12-08 ENCOUNTER — Ambulatory Visit
Admission: RE | Admit: 2013-12-08 | Discharge: 2013-12-08 | Disposition: A | Discharge status: Home / Self Care | Payer: No Typology Code available for payment source | Source: Ambulatory Visit | Attending: Gastroenterology | Admitting: Gastroenterology

## 2013-12-08 DIAGNOSIS — R1032 Left lower quadrant pain: Secondary | ICD-10-CM

## 2013-12-08 MED ORDER — IOHEXOL 300 MG/ML  SOLN
100.0000 mL | Freq: Once | INTRAMUSCULAR | Status: AC | PRN
  Administered 2013-12-08: 100 mL via INTRAVENOUS

## 2013-12-23 ENCOUNTER — Other Ambulatory Visit: Payer: Self-pay | Admitting: Gastroenterology

## 2013-12-23 DIAGNOSIS — Z86010 Personal history of colonic polyps: Secondary | ICD-10-CM | POA: Insufficient documentation

## 2014-02-04 ENCOUNTER — Inpatient Hospital Stay (HOSPITAL_COMMUNITY)
Admission: EM | Admit: 2014-02-04 | Discharge: 2014-02-06 | DRG: 392 | Disposition: A | Discharge status: Home / Self Care | Payer: BC Managed Care – PPO | Attending: Internal Medicine | Admitting: Internal Medicine

## 2014-02-04 ENCOUNTER — Encounter (HOSPITAL_COMMUNITY): Payer: Self-pay | Admitting: *Deleted

## 2014-02-04 ENCOUNTER — Emergency Department (HOSPITAL_COMMUNITY): Payer: BC Managed Care – PPO

## 2014-02-04 DIAGNOSIS — Z880 Allergy status to penicillin: Secondary | ICD-10-CM | POA: Diagnosis not present

## 2014-02-04 DIAGNOSIS — R112 Nausea with vomiting, unspecified: Secondary | ICD-10-CM | POA: Diagnosis present

## 2014-02-04 DIAGNOSIS — R000 Tachycardia, unspecified: Secondary | ICD-10-CM | POA: Diagnosis present

## 2014-02-04 DIAGNOSIS — Z90710 Acquired absence of both cervix and uterus: Secondary | ICD-10-CM | POA: Diagnosis not present

## 2014-02-04 DIAGNOSIS — R1110 Vomiting, unspecified: Secondary | ICD-10-CM

## 2014-02-04 DIAGNOSIS — I10 Essential (primary) hypertension: Secondary | ICD-10-CM | POA: Diagnosis present

## 2014-02-04 DIAGNOSIS — Z8249 Family history of ischemic heart disease and other diseases of the circulatory system: Secondary | ICD-10-CM

## 2014-02-04 DIAGNOSIS — K5732 Diverticulitis of large intestine without perforation or abscess without bleeding: Secondary | ICD-10-CM

## 2014-02-04 DIAGNOSIS — K5792 Diverticulitis of intestine, part unspecified, without perforation or abscess without bleeding: Secondary | ICD-10-CM | POA: Diagnosis not present

## 2014-02-04 DIAGNOSIS — R109 Unspecified abdominal pain: Secondary | ICD-10-CM

## 2014-02-04 DIAGNOSIS — D72829 Elevated white blood cell count, unspecified: Secondary | ICD-10-CM

## 2014-02-04 DIAGNOSIS — Z881 Allergy status to other antibiotic agents status: Secondary | ICD-10-CM | POA: Diagnosis not present

## 2014-02-04 DIAGNOSIS — K219 Gastro-esophageal reflux disease without esophagitis: Secondary | ICD-10-CM | POA: Diagnosis present

## 2014-02-04 DIAGNOSIS — R1030 Lower abdominal pain, unspecified: Secondary | ICD-10-CM | POA: Diagnosis not present

## 2014-02-04 DIAGNOSIS — I471 Supraventricular tachycardia: Secondary | ICD-10-CM

## 2014-02-04 LAB — COMPREHENSIVE METABOLIC PANEL
ALK PHOS: 44 U/L (ref 39–117)
ALT: 15 U/L (ref 0–35)
AST: 18 U/L (ref 0–37)
Albumin: 4.2 g/dL (ref 3.5–5.2)
Anion gap: 12 (ref 5–15)
BILIRUBIN TOTAL: 0.5 mg/dL (ref 0.3–1.2)
BUN: 10 mg/dL (ref 6–23)
CHLORIDE: 100 meq/L (ref 96–112)
CO2: 28 mEq/L (ref 19–32)
Calcium: 10.1 mg/dL (ref 8.4–10.5)
Creatinine, Ser: 0.75 mg/dL (ref 0.50–1.10)
GFR calc Af Amer: >90 mL/min (ref >90)
GFR calc non Af Amer: >90 mL/min (ref >90)
Glucose, Bld: 113 mg/dL — ABNORMAL HIGH (ref 70–99)
POTASSIUM: 3.9 meq/L (ref 3.7–5.3)
SODIUM: 140 meq/L (ref 137–147)
Total Protein: 8 g/dL (ref 6.0–8.3)

## 2014-02-04 LAB — CBC WITH DIFFERENTIAL/PLATELET
BASOS ABS: 0 10*3/uL (ref 0.0–0.1)
BASOS PCT: 0 % (ref 0–1)
Eosinophils Absolute: 0 10*3/uL (ref 0.0–0.7)
Eosinophils Relative: 0 % (ref 0–5)
HCT: 38 % (ref 36.0–46.0)
HEMOGLOBIN: 12.6 g/dL (ref 12.0–15.0)
Lymphocytes Relative: 24 % (ref 12–46)
Lymphs Abs: 2.9 10*3/uL (ref 0.7–4.0)
MCH: 29.6 pg (ref 26.0–34.0)
MCHC: 33.2 g/dL (ref 30.0–36.0)
MCV: 89.4 fL (ref 78.0–100.0)
Monocytes Absolute: 1.6 10*3/uL — ABNORMAL HIGH (ref 0.1–1.0)
Monocytes Relative: 13 % — ABNORMAL HIGH (ref 3–12)
NEUTROS ABS: 7.8 10*3/uL — AB (ref 1.7–7.7)
NEUTROS PCT: 63 % (ref 43–77)
PLATELETS: 316 10*3/uL (ref 150–400)
RBC: 4.25 MIL/uL (ref 3.87–5.11)
RDW: 12.7 % (ref 11.5–15.5)
WBC: 12.3 10*3/uL — ABNORMAL HIGH (ref 4.0–10.5)

## 2014-02-04 LAB — URINALYSIS, ROUTINE W REFLEX MICROSCOPIC
BILIRUBIN URINE: NEGATIVE
GLUCOSE, UA: NEGATIVE mg/dL
Hgb urine dipstick: NEGATIVE
Ketones, ur: NEGATIVE mg/dL
Leukocytes, UA: NEGATIVE
Nitrite: NEGATIVE
PH: 8.5 — AB (ref 5.0–8.0)
Protein, ur: NEGATIVE mg/dL
SPECIFIC GRAVITY, URINE: 1.02 (ref 1.005–1.030)
Urobilinogen, UA: 0.2 mg/dL (ref 0.0–1.0)

## 2014-02-04 LAB — LIPASE, BLOOD: Lipase: 39 U/L (ref 11–59)

## 2014-02-04 MED ORDER — SODIUM CHLORIDE 0.9 % IV SOLN: INTRAVENOUS | Status: DC

## 2014-02-04 MED ORDER — OXYCODONE HCL 5 MG PO TABS
5.0000 mg | ORAL_TABLET | ORAL | Status: DC | PRN
  Administered 2014-02-05 – 2014-02-06 (×8): 5 mg via ORAL
  Filled 2014-02-04 (×8): qty 1

## 2014-02-04 MED ORDER — PROMETHAZINE HCL 25 MG/ML IJ SOLN
12.5000 mg | Freq: Four times a day (QID) | INTRAMUSCULAR | Status: DC | PRN
  Filled 2014-02-04: qty 1

## 2014-02-04 MED ORDER — HYDROMORPHONE HCL 1 MG/ML IJ SOLN: 0.5000 mg | INTRAMUSCULAR | Status: DC | PRN

## 2014-02-04 MED ORDER — SODIUM CHLORIDE 0.9 % IV BOLUS (SEPSIS)
1000.0000 mL | Freq: Once | INTRAVENOUS | Status: AC
  Administered 2014-02-04: 1000 mL via INTRAVENOUS

## 2014-02-04 MED ORDER — IOHEXOL 300 MG/ML  SOLN
25.0000 mL | Freq: Once | INTRAMUSCULAR | Status: AC | PRN
  Administered 2014-02-04: 25 mL via ORAL

## 2014-02-04 MED ORDER — METRONIDAZOLE IN NACL 5-0.79 MG/ML-% IV SOLN
500.0000 mg | Freq: Once | INTRAVENOUS | Status: AC
  Administered 2014-02-04: 500 mg via INTRAVENOUS
  Filled 2014-02-04: qty 100

## 2014-02-04 MED ORDER — METRONIDAZOLE IN NACL 5-0.79 MG/ML-% IV SOLN
500.0000 mg | Freq: Three times a day (TID) | INTRAVENOUS | Status: DC
  Administered 2014-02-05 – 2014-02-06 (×4): 500 mg via INTRAVENOUS
  Filled 2014-02-04 (×6): qty 100

## 2014-02-04 MED ORDER — SODIUM CHLORIDE 0.9 % IV SOLN
INTRAVENOUS | Status: DC
  Administered 2014-02-04 – 2014-02-06 (×3): via INTRAVENOUS

## 2014-02-04 MED ORDER — ONDANSETRON HCL 4 MG/2ML IJ SOLN
4.0000 mg | Freq: Four times a day (QID) | INTRAMUSCULAR | Status: DC | PRN
  Administered 2014-02-06: 4 mg via INTRAVENOUS
  Filled 2014-02-04: qty 2

## 2014-02-04 MED ORDER — ONDANSETRON HCL 4 MG/2ML IJ SOLN: 4.0000 mg | Freq: Three times a day (TID) | INTRAMUSCULAR | Status: DC | PRN

## 2014-02-04 MED ORDER — ONDANSETRON HCL 4 MG PO TABS: 4.0000 mg | ORAL_TABLET | Freq: Four times a day (QID) | ORAL | Status: DC | PRN

## 2014-02-04 MED ORDER — HYDROMORPHONE HCL 1 MG/ML IJ SOLN
1.0000 mg | Freq: Once | INTRAMUSCULAR | Status: AC
  Administered 2014-02-04: 1 mg via INTRAVENOUS
  Filled 2014-02-04: qty 1

## 2014-02-04 MED ORDER — IOHEXOL 300 MG/ML  SOLN
100.0000 mL | Freq: Once | INTRAMUSCULAR | Status: AC | PRN
  Administered 2014-02-04: 100 mL via INTRAVENOUS

## 2014-02-04 MED ORDER — MORPHINE SULFATE 4 MG/ML IJ SOLN
4.0000 mg | Freq: Once | INTRAMUSCULAR | Status: AC
  Administered 2014-02-04: 4 mg via INTRAVENOUS
  Filled 2014-02-04: qty 1

## 2014-02-04 MED ORDER — HYDROMORPHONE HCL 1 MG/ML IJ SOLN
1.0000 mg | INTRAMUSCULAR | Status: DC | PRN
  Administered 2014-02-04: 1 mg via INTRAVENOUS
  Filled 2014-02-04: qty 1

## 2014-02-04 MED ORDER — CIPROFLOXACIN IN D5W 400 MG/200ML IV SOLN
400.0000 mg | Freq: Two times a day (BID) | INTRAVENOUS | Status: DC
  Administered 2014-02-05 – 2014-02-06 (×3): 400 mg via INTRAVENOUS
  Filled 2014-02-04 (×4): qty 200

## 2014-02-04 MED ORDER — ONDANSETRON HCL 4 MG/2ML IJ SOLN
4.0000 mg | Freq: Once | INTRAMUSCULAR | Status: AC
  Administered 2014-02-04: 4 mg via INTRAVENOUS
  Filled 2014-02-04: qty 2

## 2014-02-04 MED ORDER — ENOXAPARIN SODIUM 40 MG/0.4ML ~~LOC~~ SOLN
40.0000 mg | SUBCUTANEOUS | Status: DC
  Administered 2014-02-04 – 2014-02-05 (×2): 40 mg via SUBCUTANEOUS
  Filled 2014-02-04 (×3): qty 0.4

## 2014-02-04 MED ORDER — PROMETHAZINE HCL 25 MG/ML IJ SOLN
12.5000 mg | Freq: Once | INTRAMUSCULAR | Status: AC
  Administered 2014-02-04: 12.5 mg via INTRAVENOUS
  Filled 2014-02-04: qty 1

## 2014-02-04 MED ORDER — ACETAMINOPHEN 650 MG RE SUPP: 650.0000 mg | Freq: Four times a day (QID) | RECTAL | Status: DC | PRN

## 2014-02-04 MED ORDER — CIPROFLOXACIN IN D5W 400 MG/200ML IV SOLN
400.0000 mg | Freq: Once | INTRAVENOUS | Status: AC
  Administered 2014-02-04: 400 mg via INTRAVENOUS
  Filled 2014-02-04: qty 200

## 2014-02-04 MED ORDER — ALUM & MAG HYDROXIDE-SIMETH 200-200-20 MG/5ML PO SUSP: 30.0000 mL | Freq: Four times a day (QID) | ORAL | Status: DC | PRN

## 2014-02-04 MED ORDER — ACETAMINOPHEN 325 MG PO TABS: 650.0000 mg | ORAL_TABLET | Freq: Four times a day (QID) | ORAL | Status: DC | PRN

## 2014-02-04 NOTE — ED Notes (Signed)
Report attempt x 1 

## 2014-02-04 NOTE — ED Provider Notes (Signed)
CSN: [id number]     Arrival date & time 02/04/14  1615 History   First MD Initiated Contact with Patient 02/04/14 1643     Chief Complaint  Patient presents with  . Abdominal Pain     (Consider location/radiation/quality/duration/timing/severity/associated sxs/prior Treatment) HPI Comments: Patient is a 43 year old female past medical history significant for diverticulitis presenting to the emergency department for lower abdominal pain that began yesterday. She states her pain has been gradually worsening since then. She has developed diarrhea today, denies any blood. She states her pain and symptoms feel similar to previous diverticulitis exacerbations, states is unusual to have it on her right lower quadrant. No modifying factors identified. She denies any urinary symptoms, vaginal symptoms.   Past Medical History  Diagnosis Date  . History of syphilis   . Breast fibroadenoma   . History of ectopic pregnancy 2005    r salpyngectomy  . History of conization of cervix 2000  . H/O myomectomy 07-2006    robotic  . History of hysterectomy, supracervical 08-2008    robotic  . PMS (premenstrual syndrome)   . Back pain   . H/O sinusitis   . GERD (gastroesophageal reflux disease)   . Hx of herpes simplex type 2 infection   . History of syphilis   . Abnormal Pap smear 2000    Cone Biopsy  . H/O metrorrhagia 12/2006  . Hypertension   . VCBSWHQP(591.6)    Past Surgical History  Procedure Laterality Date  . Cervical cone biopsy  2000  . Ectopic pregnancy surgery  2005    R salpyngectomy  . Myomectomy  2008    robotic  . Robotic assisted lap vaginal hysterectomy  2010    supracervical  . Polypectomy  2009  . Salpingectomy  2005  . Dilation and curettage of uterus  2009  . Abdominal hysterectomy    . Breast surgery  2004    fibroademoma  . Breast lumpectomy  10/11    rt-neg  . Trigger finger release Right 09/28/2012    Procedure: RELEASE TRIGGER FINGER/A-1 PULLEY RIGHT THUMB;   Surgeon: Jolyn Nap, MD;  Location: [facility];  Service: Orthopedics;  Laterality: Right;   Family History  Problem Relation Age of Onset  . Hypertension Paternal Grandmother   . Diabetes Paternal Grandmother   . Hypertension Maternal Grandmother   . Sarcoidosis Mother   . Hypertension Mother   . Thyroid disease Maternal Aunt   . Mental illness Cousin    History  Substance Use Topics  . Smoking status: Former Smoker -- 0.25 packs/day for 15 years    Types: Cigarettes    Quit date: 06/12/2011  . Smokeless tobacco: Never Used  . Alcohol Use: 1.5 oz/week    3 drink(s) per week   OB History    Gravida Para Term Preterm AB TAB SAB Ectopic Multiple Living   4 2 2  2 1  0 1 0 2     Review of Systems  Gastrointestinal: Positive for abdominal pain and diarrhea.  All other systems reviewed and are negative.     Allergies  Keflex and Penicillins  Home Medications   Prior to Admission medications   Medication Sig Start Date End Date Taking? Authorizing Provider  Black Cohosh (REMIFEMIN PO) Take 2 tablets by mouth daily.   Yes Historical Provider, MD  ibuprofen (ADVIL,MOTRIN) 800 MG tablet Take 800 mg by mouth every 8 (eight) hours as needed for moderate pain.   Yes Historical Provider, MD  lisinopril (PRINIVIL,ZESTRIL) 10 MG tablet Take 10 mg by mouth daily.   Yes Historical Provider, MD  Multiple Vitamins-Minerals (HAIR/SKIN/NAILS PO) Take 1 tablet by mouth daily.   Yes Historical Provider, MD  clindamycin (CLEOCIN) 150 MG capsule Take 3 capsules (450 mg total) by mouth every 8 (eight) hours. Patient not taking: Reported on 02/04/2014 11/04/13   Nita Sells, MD  HYDROcodone-acetaminophen (NORCO/VICODIN) 5-325 MG per tablet Take 2 tablets by mouth every 4 (four) hours as needed. Patient not taking: Reported on 02/04/2014 11/05/13   Nita Sells, MD  ondansetron (ZOFRAN) 4 MG tablet Take 1 tablet (4 mg total) by mouth every 6 (six) hours as needed  for nausea. 11/05/13   Nita Sells, MD   BP 114/64 mmHg  Pulse 107  Temp(Src) 98.8 F (37.1 C) (Oral)  Resp 18  SpO2 97% Physical Exam  Constitutional: She is oriented to person, place, and time. She appears well-developed and well-nourished. No distress.  HENT:  Head: Normocephalic and atraumatic.  Right Ear: External ear normal.  Left Ear: External ear normal.  Nose: Nose normal.  Mouth/Throat: Oropharynx is clear and moist. No oropharyngeal exudate.  Eyes: Conjunctivae are normal.  Neck: Neck supple.  Cardiovascular: Normal rate, regular rhythm and normal heart sounds.   Pulmonary/Chest: Effort normal and breath sounds normal.  Abdominal: Soft. Bowel sounds are normal. She exhibits no distension. There is tenderness in the right lower quadrant, suprapubic area and left lower quadrant. There is guarding (voluntary ). There is no rigidity and no rebound.  Neurological: She is alert and oriented to person, place, and time.  Skin: Skin is warm and dry. She is not diaphoretic.  Nursing note and vitals reviewed.   ED Course  Procedures (including critical care time) Medications  metroNIDAZOLE (FLAGYL) IVPB 500 mg (500 mg Intravenous New Bag/Given 02/04/14 2033)  ciprofloxacin (CIPRO) IVPB 400 mg (400 mg Intravenous New Bag/Given 02/04/14 2018)  0.9 %  sodium chloride infusion (not administered)  HYDROmorphone (DILAUDID) injection 1 mg (not administered)  ondansetron (ZOFRAN) injection 4 mg (not administered)  sodium chloride 0.9 % bolus 1,000 mL (0 mLs Intravenous Stopped 02/04/14 2018)  ondansetron (ZOFRAN) injection 4 mg (4 mg Intravenous Given 02/04/14 1705)  morphine 4 MG/ML injection 4 mg (4 mg Intravenous Given 02/04/14 1707)  HYDROmorphone (DILAUDID) injection 1 mg (1 mg Intravenous Given 02/04/14 1739)  iohexol (OMNIPAQUE) 300 MG/ML solution 25 mL (25 mLs Oral Contrast Given 02/04/14 1748)  iohexol (OMNIPAQUE) 300 MG/ML solution 100 mL (100 mLs Intravenous Contrast  Given 02/04/14 1852)  HYDROmorphone (DILAUDID) injection 1 mg (1 mg Intravenous Given 02/04/14 1950)  ondansetron (ZOFRAN) injection 4 mg (4 mg Intravenous Given 02/04/14 2105)    Labs Review Labs Reviewed  CBC WITH DIFFERENTIAL - Abnormal; Notable for the following:    WBC 12.3 (*)    Neutro Abs 7.8 (*)    Monocytes Relative 13 (*)    Monocytes Absolute 1.6 (*)    All other components within normal limits  COMPREHENSIVE METABOLIC PANEL - Abnormal; Notable for the following:    Glucose, Bld 113 (*)    All other components within normal limits  URINALYSIS, ROUTINE W REFLEX MICROSCOPIC - Abnormal; Notable for the following:    pH 8.5 (*)    All other components within normal limits  LIPASE, BLOOD    Imaging Review Ct Abdomen Pelvis W Contrast  02/04/2014   CLINICAL DATA:  Lower abdominal pain with diarrhea for 2 days  EXAM: CT ABDOMEN AND PELVIS WITH CONTRAST  TECHNIQUE: Multidetector CT imaging of the abdomen and pelvis was performed using the standard protocol following bolus administration of intravenous contrast. Oral contrast was also administered.  CONTRAST:  146mL OMNIPAQUE IOHEXOL 300 MG/ML  SOLN  COMPARISON:  February 07, 2014  FINDINGS: There is mild bibasilar lung atelectatic change.  No focal liver lesions are identified. Gallbladder wall is not appreciably thickened. There is no biliary duct dilatation.  Spleen, pancreas, and right adrenal appear normal. There is a stable presumed adenoma in the left adrenal measuring 1.2 x 1.1 cm. Kidneys bilaterally show no mass or hydronephrosis on either side. There is no renal or ureteral calculus on either side.  In the pelvis, urinary bladder is midline with normal wall thickness. There is sigmoid diverticulitis in the mid sigmoid region, characterized by wall thickening and mesenteric inflammatory change. There is evidence of early phlegmon formation along the posterior aspect of the mid sigmoid colon measuring 1.6 x 1.5 cm. There is no  demonstrable microperforation. There is no well-defined pelvic mass or fluid collection. Uterus is absent. The appendix appears normal.  There is no bowel obstruction. No free air or portal venous air. There is thinning of the rectus muscles in the midline without frank herniation.  There is no ascites, adenopathy, or abscess in the abdomen or pelvis. There is no demonstrable abdominal aortic aneurysm. There are no blastic or lytic bone lesions.  IMPRESSION: Sigmoid diverticulitis. Small phlegmon along the posterior aspect of the mid sigmoid colon. No air is seen in this area to indicate frank abscess. No demonstrable microperforation apparent.  No bowel obstruction. Appendix appears normal. No renal or ureteral calculus. No hydronephrosis. Small presumed adenoma left adrenal, stable.   Electronically Signed   By: Lowella Grip M.D.   On: 02/04/2014 19:29     EKG Interpretation None      Patient unable to tolerate PO, will admit patient for Diverticulitis treatment with pain and nausea control. Discussed patient with Dr. Arnoldo Morale.   MDM   Final diagnoses:  Abdominal pain  Diverticulitis of large intestine without perforation or abscess without bleeding    Filed Vitals:   02/04/14 2030  BP: 114/64  Pulse: 107  Temp:   Resp:    I have reviewed nursing notes, vital signs, and all appropriate lab and imaging results for this patient. Patient with diverticulitis on CT scan, no perforation or abscess. Leukocytosis and tachycardia. Will start on Cipro and Flagyl IV. Admit patient to [facility] for further management and evaluation.  Patient d/w with Dr. Johnney Killian, agrees with plan.      Harlow Mares, PA-C 02/04/14 2114  Charlesetta Shanks, MD 02/04/14 2233

## 2014-02-04 NOTE — ED Notes (Signed)
thge pt is c/;o abd pain since yesterday with diarrhea today no n or v.  Hx of diverticulitis  lmp none

## 2014-02-04 NOTE — H&P (Signed)
[facility] Admission History and Physical       [name] FIE:[id number] DOB: 04/24/1970 DOA: 02/04/2014  Referring physician: EDP PCP: Freddie Apley, MD  Specialists:   Chief Complaint: ABD Pain Fever Chills  HPI: [name] is a 43 y.o. female with a history of HTN who presents to the ED with complaints of Lower ABD Pain, Diarrhea and Fevers and Chills x 2 days with nausea and vomiting that started today.   She was found to have Diverticulitis on CT scan and was placed on IV Cipro and Flagyl and referred for admission.      Review of Systems:  Constitutional: No Weight Loss, No Weight Gain, Night Sweats, +Fevers, +Chills, Dizziness, Fatigue, or Generalized Weakness HEENT: No Headaches, Difficulty Swallowing,Tooth/Dental Problems,Sore Throat,  No Sneezing, Rhinitis, Ear Ache, Nasal Congestion, or Post Nasal Drip,  Cardio-vascular:  No Chest pain, Orthopnea, PND, Edema in Lower Extremities, Anasarca, Dizziness, Palpitations  Resp: No Dyspnea, No DOE, No Productive Cough, No Non-Productive Cough, No Hemoptysis, No Wheezing.    GI: No Heartburn, Indigestion, +Abdominal Pain, +Nausea, +Vomiting, +Diarrhea, Hematemesis, Hematochezia, Melena, Change in Bowel Habits,  Loss of Appetite  GU: No Dysuria, Change in Color of Urine, No Urgency or Frequency, No Flank pain.  Musculoskeletal: No Joint Pain or Swelling, No Decreased Range of Motion, No Back Pain.  Neurologic: No Syncope, No Seizures, Muscle Weakness, Paresthesia, Vision Disturbance or Loss, No Diplopia, No Vertigo, No Difficulty Walking,  Skin: No Rash or Lesions. Psych: No Change in Mood or Affect, No Depression or Anxiety, No Memory loss, No Confusion, or Hallucinations   Past Medical History  Diagnosis Date  . History of syphilis   . Breast fibroadenoma   . History of ectopic pregnancy 2005    r salpyngectomy  . History of conization of cervix 2000  . H/O myomectomy 07-2006    robotic  .  History of hysterectomy, supracervical 08-2008    robotic  . PMS (premenstrual syndrome)   . Back pain   . H/O sinusitis   . GERD (gastroesophageal reflux disease)   . Hx of herpes simplex type 2 infection   . History of syphilis   . Abnormal Pap smear 2000    Cone Biopsy  . H/O metrorrhagia 12/2006  . Hypertension   . [id number](601.0)       Past Surgical History  Procedure Laterality Date  . Cervical cone biopsy  2000  . Ectopic pregnancy surgery  2005    R salpyngectomy  . Myomectomy  2008    robotic  . Robotic assisted lap vaginal hysterectomy  2010    supracervical  . Polypectomy  2009  . Salpingectomy  2005  . Dilation and curettage of uterus  2009  . Abdominal hysterectomy    . Breast surgery  2004    fibroademoma  . Breast lumpectomy  10/11    rt-neg  . Trigger finger release Right 09/28/2012    Procedure: RELEASE TRIGGER FINGER/A-1 PULLEY RIGHT THUMB;  Surgeon: Jolyn Nap, MD;  Location: [facility];  Service: Orthopedics;  Laterality: Right;       Prior to Admission medications   Medication Sig Start Date End Date Taking? Authorizing Provider  Black Cohosh (REMIFEMIN PO) Take 2 tablets by mouth daily.   Yes Historical Provider, MD  ibuprofen (ADVIL,MOTRIN) 800 MG tablet Take 800 mg by mouth every 8 (eight) hours as needed for moderate pain.   Yes Historical Provider, MD  lisinopril (PRINIVIL,ZESTRIL) 10 MG tablet  Take 10 mg by mouth daily.   Yes Historical Provider, MD  Multiple Vitamins-Minerals (HAIR/SKIN/NAILS PO) Take 1 tablet by mouth daily.   Yes Historical Provider, MD  clindamycin (CLEOCIN) 150 MG capsule Take 3 capsules (450 mg total) by mouth every 8 (eight) hours. Patient not taking: Reported on 02/04/2014 11/04/13   Nita Sells, MD  HYDROcodone-acetaminophen (NORCO/VICODIN) 5-325 MG per tablet Take 2 tablets by mouth every 4 (four) hours as needed. Patient not taking: Reported on 02/04/2014 11/05/13   Nita Sells, MD   ondansetron (ZOFRAN) 4 MG tablet Take 1 tablet (4 mg total) by mouth every 6 (six) hours as needed for nausea. 11/05/13   Nita Sells, MD      Allergies  Allergen Reactions  . Keflex [Cephalexin] Anaphylaxis  . Penicillins Anaphylaxis     Social History:  reports that she quit smoking about 2 years ago. Her smoking use included Cigarettes. She has a 3.75 pack-year smoking history. She has never used smokeless tobacco. She reports that she drinks about 1.5 oz of alcohol per week. She reports that she does not use illicit drugs.     Family History  Problem Relation Age of Onset  . Hypertension Paternal Grandmother   . Diabetes Paternal Grandmother   . Hypertension Maternal Grandmother   . Sarcoidosis Mother   . Hypertension Mother   . Thyroid disease Maternal Aunt   . Mental illness Cousin        Physical Exam:  GEN:  Pleasant Obese 43 y.o. African American female examined  and in no acute distress; cooperative with exam Filed Vitals:   02/04/14 1800 02/04/14 1830 02/04/14 2000 02/04/14 2030  BP: 138/76 119/64 116/59 114/64  Pulse: 108 106 108 107  Temp:      TempSrc:      Resp:      SpO2: 97% 96% 97% 97%   Blood pressure 114/64, pulse 107, temperature 98.8 F (37.1 C), temperature source Oral, resp. rate 18, SpO2 97 %. PSYCH: She is alert and oriented x4; does not appear anxious does not appear depressed; affect is normal HEENT: Normocephalic and Atraumatic, Mucous membranes pink; PERRLA; EOM intact; Fundi:  Benign;  No scleral icterus, Nares: Patent, Oropharynx: Clear, Fair Dentition,    Neck:  FROM, No Cervical Lymphadenopathy nor Thyromegaly or Carotid Bruit; No JVD; Breasts:: Not examined CHEST WALL: No tenderness CHEST: Normal respiration, clear to auscultation bilaterally HEART: Tachycardic, Regular rate and rhythm; no murmurs rubs or gallops BACK: No kyphosis or scoliosis; No CVA tenderness ABDOMEN: Positive Bowel Sounds, Obese, Soft Non-Tender; No  Masses, No Organomegaly. Rectal Exam: Not done EXTREMITIES: No Cyanosis, Clubbing, or Edema; No Ulcerations. Genitalia: not examined PULSES: 2+ and symmetric SKIN: Normal hydration no rash or ulceration CNS:  Alert and Oriented x 4, No Focal Deficits Vascular: pulses palpable throughout    Labs on Admission:  Basic Metabolic Panel:  Recent Labs Lab 02/04/14 1625  NA 140  K 3.9  CL 100  CO2 28  GLUCOSE 113*  BUN 10  CREATININE 0.75  CALCIUM 10.1   Liver Function Tests:  Recent Labs Lab 02/04/14 1625  AST 18  ALT 15  ALKPHOS 44  BILITOT 0.5  PROT 8.0  ALBUMIN 4.2    Recent Labs Lab 02/04/14 1625  LIPASE 39   No results for input(s): AMMONIA in the last 168 hours. CBC:  Recent Labs Lab 02/04/14 1625  WBC 12.3*  NEUTROABS 7.8*  HGB 12.6  HCT 38.0  MCV 89.4  PLT 316  Cardiac Enzymes: No results for input(s): CKTOTAL, CKMB, CKMBINDEX, TROPONINI in the last 168 hours.  BNP (last 3 results) No results for input(s): PROBNP in the last 8760 hours. CBG: No results for input(s): GLUCAP in the last 168 hours.  Radiological Exams on Admission: Ct Abdomen Pelvis W Contrast  02/04/2014   CLINICAL DATA:  Lower abdominal pain with diarrhea for 2 days  EXAM: CT ABDOMEN AND PELVIS WITH CONTRAST  TECHNIQUE: Multidetector CT imaging of the abdomen and pelvis was performed using the standard protocol following bolus administration of intravenous contrast. Oral contrast was also administered.  CONTRAST:  142mL OMNIPAQUE IOHEXOL 300 MG/ML  SOLN  COMPARISON:  February 07, 2014  FINDINGS: There is mild bibasilar lung atelectatic change.  No focal liver lesions are identified. Gallbladder wall is not appreciably thickened. There is no biliary duct dilatation.  Spleen, pancreas, and right adrenal appear normal. There is a stable presumed adenoma in the left adrenal measuring 1.2 x 1.1 cm. Kidneys bilaterally show no mass or hydronephrosis on either side. There is no renal or  ureteral calculus on either side.  In the pelvis, urinary bladder is midline with normal wall thickness. There is sigmoid diverticulitis in the mid sigmoid region, characterized by wall thickening and mesenteric inflammatory change. There is evidence of early phlegmon formation along the posterior aspect of the mid sigmoid colon measuring 1.6 x 1.5 cm. There is no demonstrable microperforation. There is no well-defined pelvic mass or fluid collection. Uterus is absent. The appendix appears normal.  There is no bowel obstruction. No free air or portal venous air. There is thinning of the rectus muscles in the midline without frank herniation.  There is no ascites, adenopathy, or abscess in the abdomen or pelvis. There is no demonstrable abdominal aortic aneurysm. There are no blastic or lytic bone lesions.  IMPRESSION: Sigmoid diverticulitis. Small phlegmon along the posterior aspect of the mid sigmoid colon. No air is seen in this area to indicate frank abscess. No demonstrable microperforation apparent.  No bowel obstruction. Appendix appears normal. No renal or ureteral calculus. No hydronephrosis. Small presumed adenoma left adrenal, stable.   Electronically Signed   By: Lowella Grip M.D.   On: 02/04/2014 19:29       Assessment/Plan:   43 y.o. female with  Principal Problem:   1.   Diverticulitis   IV Cipro and Metronidazole   IVFs   Pain Control PRN   Active Problems:   2.   Leukocytosis- Due to #1   Monitor Trend     3.   Lower abdominal pain- due to #1   Pain Control PRN        4.   Sinus tachycardia- due to Pain and Fever   IVFs   Pain Control PRN   Monitor Trend     5.   HTN (hypertension)   Hold Lisinopril   Monitor BPs     6.  Nausea and vomiting   Anti-Emetics PRN     7.  DVT Prophylaxis   Lovenox    Code Status:   FULL CODE  Family Communication:    No Family at Bedside Disposition Plan:   Inpatient       Time spent:  [place]  C [facility] Pager [phone]   If [place] Please Contact the Day Rounding Team MD for [facility]  If 7PM-7AM, Please Contact Night-Floor Coverage  [email or web address] Password [email or web address] 02/04/2014, 9:38 PM

## 2014-02-04 NOTE — ED Notes (Signed)
CT notified that pt is finished with contrast.  

## 2014-02-05 LAB — BASIC METABOLIC PANEL
Anion gap: 11 (ref 5–15)
BUN: 7 mg/dL (ref 6–23)
CO2: 28 mEq/L (ref 19–32)
Calcium: 9 mg/dL (ref 8.4–10.5)
Chloride: 101 mEq/L (ref 96–112)
Creatinine, Ser: 0.71 mg/dL (ref 0.50–1.10)
GFR calc non Af Amer: >90 mL/min (ref >90)
Glucose, Bld: 104 mg/dL — ABNORMAL HIGH (ref 70–99)
POTASSIUM: 3.9 meq/L (ref 3.7–5.3)
Sodium: 140 mEq/L (ref 137–147)

## 2014-02-05 LAB — CBC
HCT: 34.7 % — ABNORMAL LOW (ref 36.0–46.0)
Hemoglobin: 11.2 g/dL — ABNORMAL LOW (ref 12.0–15.0)
MCH: 29.8 pg (ref 26.0–34.0)
MCHC: 32.3 g/dL (ref 30.0–36.0)
MCV: 92.3 fL (ref 78.0–100.0)
Platelets: 287 10*3/uL (ref 150–400)
RBC: 3.76 MIL/uL — AB (ref 3.87–5.11)
RDW: 12.9 % (ref 11.5–15.5)
WBC: 8.9 10*3/uL (ref 4.0–10.5)

## 2014-02-05 NOTE — Progress Notes (Signed)
Patient ID: [name]  female  UXL:[id number]    DOB: [birth year]    DOA: 02/04/2014  PCP: Freddie Apley, MD  Brief history of present illness  Patient is a 43 year old female with hypertension presented with lower abdominal pain, left lower quadrant, diarrhea, fevers and chills for 2 days, nausea and vomiting. Patient was found to have acute diverticulitis on CT scan and was admitted, placed on IV ciprofloxacin and Flagyl.  Assessment/Plan: Principal Problem: Acute Diverticulitis second episode per patient - Improving, continue IV fluids, pain control, IV ciprofloxacin and Flagyl - Tolerating clear liquid diet, however wants to advance diet and wants to try soft solids.  Active Problems:   HTN (hypertension) -Currently stable     Leukocytosis - Resolved likely due to #1    Sinus tachycardia Improved, likely due to #1     Nausea and vomiting - Resolved   DVT Prophylaxis: Lovenox   Code Status: full CODE STATUS   Family Communication:  Disposition:  Consultants: None   Procedures   None   Antibiotics: IV ciprofloxacin IV Flagyl  Subjective  left lower quadrant abdominal pain improving, no active nausea vomiting or diarrhea, wants to try soft solids, no fevers or chills   Objective  Weight change:   Intake/Output Summary (Last 24 hours) at 02/05/14 1209 Last data filed at 02/05/14 1024  Gross per 24 hour  Intake   1650 ml  Output      0 ml  Net   1650 ml   Blood pressure 139/68, pulse 95, temperature 98.1 F (36.7 C), temperature source Oral, resp. rate 16, height 5\' 4"  (1.626 m), weight 89 kg (196 lb 3.4 oz), SpO2 100 %.  Physical Exam: General: Alert and awake, oriented x3, not in any acute distress. HEENT: anicteric sclera, PERLA, EOMI CVS: S1-S2 clear, no murmur rubs or gallops Chest: clear to auscultation bilaterally, no wheezing, rales or rhonchi Abdomen: soft, mild tenderness in the left lower quadrant, ND  Extremities: no  cyanosis, clubbing or edema noted bilaterally Neuro: Cranial nerves II-XII intact, no focal neurological deficits  Lab Results: Basic Metabolic Panel:  Recent Labs Lab 02/04/14 1625 02/05/14 0414  NA 140 140  K 3.9 3.9  CL 100 101  CO2 28 28  GLUCOSE 113* 104*  BUN 10 7  CREATININE 0.75 0.71  CALCIUM 10.1 9.0   Liver Function Tests:  Recent Labs Lab 02/04/14 1625  AST 18  ALT 15  ALKPHOS 44  BILITOT 0.5  PROT 8.0  ALBUMIN 4.2    Recent Labs Lab 02/04/14 1625  LIPASE 39   No results for input(s): AMMONIA in the last 168 hours. CBC:  Recent Labs Lab 02/04/14 1625 02/05/14 0414  WBC 12.3* 8.9  NEUTROABS 7.8*  --   HGB 12.6 11.2*  HCT 38.0 34.7*  MCV 89.4 92.3  PLT 316 287   Cardiac Enzymes: No results for input(s): CKTOTAL, CKMB, CKMBINDEX, TROPONINI in the last 168 hours. BNP: Invalid input(s): POCBNP CBG: No results for input(s): GLUCAP in the last 168 hours.   Micro Results: No results found for this or any previous visit (from the past 240 hour(s)).  Studies/Results: Ct Abdomen Pelvis W Contrast  02/04/2014   CLINICAL DATA:  Lower abdominal pain with diarrhea for 2 days  EXAM: CT ABDOMEN AND PELVIS WITH CONTRAST  TECHNIQUE: Multidetector CT imaging of the abdomen and pelvis was performed using the standard protocol following bolus administration of intravenous contrast. Oral contrast was also administered.  CONTRAST:  164mL OMNIPAQUE IOHEXOL 300 MG/ML  SOLN  COMPARISON:  February 07, 2014  FINDINGS: There is mild bibasilar lung atelectatic change.  No focal liver lesions are identified. Gallbladder wall is not appreciably thickened. There is no biliary duct dilatation.  Spleen, pancreas, and right adrenal appear normal. There is a stable presumed adenoma in the left adrenal measuring 1.2 x 1.1 cm. Kidneys bilaterally show no mass or hydronephrosis on either side. There is no renal or ureteral calculus on either side.  In the pelvis, urinary bladder is  midline with normal wall thickness. There is sigmoid diverticulitis in the mid sigmoid region, characterized by wall thickening and mesenteric inflammatory change. There is evidence of early phlegmon formation along the posterior aspect of the mid sigmoid colon measuring 1.6 x 1.5 cm. There is no demonstrable microperforation. There is no well-defined pelvic mass or fluid collection. Uterus is absent. The appendix appears normal.  There is no bowel obstruction. No free air or portal venous air. There is thinning of the rectus muscles in the midline without frank herniation.  There is no ascites, adenopathy, or abscess in the abdomen or pelvis. There is no demonstrable abdominal aortic aneurysm. There are no blastic or lytic bone lesions.  IMPRESSION: Sigmoid diverticulitis. Small phlegmon along the posterior aspect of the mid sigmoid colon. No air is seen in this area to indicate frank abscess. No demonstrable microperforation apparent.  No bowel obstruction. Appendix appears normal. No renal or ureteral calculus. No hydronephrosis. Small presumed adenoma left adrenal, stable.   Electronically Signed   By: Lowella Grip M.D.   On: 02/04/2014 19:29    Medications: Scheduled Meds: . ciprofloxacin  400 mg Intravenous Q12H  . enoxaparin (LOVENOX) injection  40 mg Subcutaneous Q24H  . metronidazole  500 mg Intravenous Q8H      LOS: 1 day   Sotiria Keast M.D. [facility] 02/05/2014, 12:09 PM Pager: [phone]  If 7PM-7AM, please contact night-coverage [email or web address] Password [email or web address]

## 2014-02-06 MED ORDER — PROMETHAZINE HCL 25 MG PO TABS: 25.0000 mg | ORAL_TABLET | Freq: Four times a day (QID) | ORAL | Status: DC | PRN

## 2014-02-06 MED ORDER — CIPROFLOXACIN HCL 500 MG PO TABS: 500.0000 mg | ORAL_TABLET | Freq: Two times a day (BID) | ORAL | Status: DC

## 2014-02-06 MED ORDER — DOCUSATE SODIUM 100 MG PO CAPS: 100.0000 mg | ORAL_CAPSULE | Freq: Every day | ORAL | Status: DC

## 2014-02-06 MED ORDER — SACCHAROMYCES BOULARDII 250 MG PO CAPS: 250.0000 mg | ORAL_CAPSULE | Freq: Two times a day (BID) | ORAL | Status: DC

## 2014-02-06 MED ORDER — HYDROCODONE-ACETAMINOPHEN 5-325 MG PO TABS: 1.0000 | ORAL_TABLET | Freq: Four times a day (QID) | ORAL | Status: DC | PRN

## 2014-02-06 MED ORDER — METRONIDAZOLE 500 MG PO TABS: 500.0000 mg | ORAL_TABLET | Freq: Three times a day (TID) | ORAL | Status: DC

## 2014-02-06 NOTE — Progress Notes (Signed)
Patient discharge teaching given, including activity, diet, follow-up appoints, and medications. Patient verbalized understanding of all discharge instructions. IV access was d/c'd. Vitals are stable. Skin is intact except as charted in most recent assessments. Pt to be escorted out by NT, to be driven home by family. 

## 2014-02-06 NOTE — Progress Notes (Signed)
Utilization Review Completed.[name] T12/20/2015  

## 2014-02-06 NOTE — Discharge Summary (Signed)
Physician Discharge Summary  Patient ID: [name] MRN: [id number] DOB/AGE: 06-10-1970 43 y.o.  Admit date: 02/04/2014 Discharge date: 02/06/2014  Primary Care Physician:  Freddie Apley, MD  Discharge Diagnoses:    . Acute Diverticulitis . Lower abdominal pain . Sinus tachycardia . Leukocytosis . HTN (hypertension) . Nausea and vomiting  Consults:  None   Recommendations for Outpatient Follow-up:  Patient was admitted to follow-up with her gastroenterologist due to diverticulitis flares.  TESTS THAT NEED FOLLOW-UP None   DIET: Soft diet  Allergies:   Allergies  Allergen Reactions  . Keflex [Cephalexin] Anaphylaxis  . Penicillins Anaphylaxis     Discharge Medications:   Medication List    STOP taking these medications        clindamycin 150 MG capsule  Commonly known as:  CLEOCIN     ibuprofen 800 MG tablet  Commonly known as:  ADVIL,MOTRIN      TAKE these medications        ciprofloxacin 500 MG tablet  Commonly known as:  CIPRO  Take 1 tablet (500 mg total) by mouth 2 (two) times daily. X 2 weeks     docusate sodium 100 MG capsule  Commonly known as:  COLACE  Take 1 capsule (100 mg total) by mouth at bedtime.     HAIR/SKIN/NAILS PO  Take 1 tablet by mouth daily.     HYDROcodone-acetaminophen 5-325 MG per tablet  Commonly known as:  NORCO/VICODIN  Take 1 tablet by mouth every 6 (six) hours as needed for moderate pain.     lisinopril 10 MG tablet  Commonly known as:  PRINIVIL,ZESTRIL  Take 10 mg by mouth daily.     metroNIDAZOLE 500 MG tablet  Commonly known as:  FLAGYL  Take 1 tablet (500 mg total) by mouth 3 (three) times daily. X 2 weeks     ondansetron 4 MG tablet  Commonly known as:  ZOFRAN  Take 1 tablet (4 mg total) by mouth every 6 (six) hours as needed for nausea.     promethazine 25 MG tablet  Commonly known as:  PHENERGAN  Take 1 tablet (25 mg total) by mouth every 6 (six) hours as needed for nausea or vomiting.      REMIFEMIN PO  Take 2 tablets by mouth daily.     saccharomyces boulardii 250 MG capsule  Commonly known as:  FLORASTOR  Take 1 capsule (250 mg total) by mouth 2 (two) times daily.         Brief H and P: For complete details please refer to admission H and P, but in brief patient is a 43 year old female with hypertension who presented to ED with lower abdominal pain, diarrhea, fever and chills for 2 days, nausea and vomiting that started on the day of admission. Patient was found to have diverticulitis on CT scan. She was admitted and placed on IV ciprofloxacin and Flagyl.  Hospital Course:   Acute Diverticulitis second episode per patient Improving, patient was initially placed on IV fluids, pain control IV ciprofloxacin and Flagyl and clear liquid diet. Currently she is tolerating soft diet without any difficulty. She was transitioned to oral ciprofloxacin and Flagyl for 2 weeks. Patient was also provided given a prescription for probiotic. She was recommended to follow-up with her gastroenterologist, Dr. Oletta Lamas after 2 weeks for follow-up.    HTN (hypertension)-Currently stable   Leukocytosis- Resolved likely due to #1   Sinus tachycardia Improved, likely due to #1   Nausea and vomiting- Resolved  Day of Discharge BP 135/79 mmHg  Pulse 86  Temp(Src) 98.4 F (36.9 C) (Oral)  Resp 12  Ht 5\' 4"  (1.626 m)  Wt 89 kg (196 lb 3.4 oz)  BMI 33.66 kg/m2  SpO2 99%  Physical Exam: General: Alert and awake oriented x3 not in any acute distress. HEENT: anicteric sclera, pupils reactive to light and accommodation CVS: S1-S2 clear no murmur rubs or gallops Chest: clear to auscultation bilaterally, no wheezing rales or rhonchi Abdomen: soft with a mild left lower quadrant abdominal tenderness significantly improving, nondistended, normal bowel sounds Extremities: no cyanosis, clubbing or edema noted bilaterally Neuro: Cranial nerves II-XII intact, no focal  neurological deficits   The results of significant diagnostics from this hospitalization (including imaging, microbiology, ancillary and laboratory) are listed below for reference.    LAB RESULTS: Basic Metabolic Panel:  Recent Labs Lab 02/04/14 1625 02/05/14 0414  NA 140 140  K 3.9 3.9  CL 100 101  CO2 28 28  GLUCOSE 113* 104*  BUN 10 7  CREATININE 0.75 0.71  CALCIUM 10.1 9.0   Liver Function Tests:  Recent Labs Lab 02/04/14 1625  AST 18  ALT 15  ALKPHOS 44  BILITOT 0.5  PROT 8.0  ALBUMIN 4.2    Recent Labs Lab 02/04/14 1625  LIPASE 39   No results for input(s): AMMONIA in the last 168 hours. CBC:  Recent Labs Lab 02/04/14 1625 02/05/14 0414  WBC 12.3* 8.9  NEUTROABS 7.8*  --   HGB 12.6 11.2*  HCT 38.0 34.7*  MCV 89.4 92.3  PLT 316 287   Cardiac Enzymes: No results for input(s): CKTOTAL, CKMB, CKMBINDEX, TROPONINI in the last 168 hours. BNP: Invalid input(s): POCBNP CBG: No results for input(s): GLUCAP in the last 168 hours.  Significant Diagnostic Studies:  Ct Abdomen Pelvis W Contrast  02/04/2014   CLINICAL DATA:  Lower abdominal pain with diarrhea for 2 days  EXAM: CT ABDOMEN AND PELVIS WITH CONTRAST  TECHNIQUE: Multidetector CT imaging of the abdomen and pelvis was performed using the standard protocol following bolus administration of intravenous contrast. Oral contrast was also administered.  CONTRAST:  118mL OMNIPAQUE IOHEXOL 300 MG/ML  SOLN  COMPARISON:  February 07, 2014  FINDINGS: There is mild bibasilar lung atelectatic change.  No focal liver lesions are identified. Gallbladder wall is not appreciably thickened. There is no biliary duct dilatation.  Spleen, pancreas, and right adrenal appear normal. There is a stable presumed adenoma in the left adrenal measuring 1.2 x 1.1 cm. Kidneys bilaterally show no mass or hydronephrosis on either side. There is no renal or ureteral calculus on either side.  In the pelvis, urinary bladder is midline  with normal wall thickness. There is sigmoid diverticulitis in the mid sigmoid region, characterized by wall thickening and mesenteric inflammatory change. There is evidence of early phlegmon formation along the posterior aspect of the mid sigmoid colon measuring 1.6 x 1.5 cm. There is no demonstrable microperforation. There is no well-defined pelvic mass or fluid collection. Uterus is absent. The appendix appears normal.  There is no bowel obstruction. No free air or portal venous air. There is thinning of the rectus muscles in the midline without frank herniation.  There is no ascites, adenopathy, or abscess in the abdomen or pelvis. There is no demonstrable abdominal aortic aneurysm. There are no blastic or lytic bone lesions.  IMPRESSION: Sigmoid diverticulitis. Small phlegmon along the posterior aspect of the mid sigmoid colon. No air is seen in this area to indicate  frank abscess. No demonstrable microperforation apparent.  No bowel obstruction. Appendix appears normal. No renal or ureteral calculus. No hydronephrosis. Small presumed adenoma left adrenal, stable.   Electronically Signed   By: Lowella Grip M.D.   On: 02/04/2014 19:29    2D ECHO:   Disposition and Follow-up:     Discharge Instructions    Diet - low sodium heart healthy    Complete by:  As directed      Discharge instructions    Complete by:  As directed   SOFT DIET     Increase activity slowly    Complete by:  As directed             DISPOSITION: home   DISCHARGE FOLLOW-UP Follow-up Information    Follow up with EDWARDS JR,JAMES L, MD. Schedule an appointment as soon as possible for a visit in 2 weeks.   Specialty:  Gastroenterology   Why:  for hospital follow-up   Contact information:   [address] [address] [phone]       Follow up with Nunzio Cobbs A, MD. Schedule an appointment as soon as possible for a visit in 2 weeks.   Why:  for  hospital follow-up   Contact information:   [place] [id number] [phone]        Time spent on Discharge: 33 mins  Signed:   Garfield Coiner M.D. [facility] 02/06/2014, 10:01 AM Pager: [phone]

## 2014-05-19 ENCOUNTER — Encounter (HOSPITAL_COMMUNITY): Payer: Self-pay | Admitting: Emergency Medicine

## 2014-05-19 DIAGNOSIS — R1032 Left lower quadrant pain: Secondary | ICD-10-CM | POA: Diagnosis not present

## 2014-05-19 DIAGNOSIS — Z79899 Other long term (current) drug therapy: Secondary | ICD-10-CM

## 2014-05-19 DIAGNOSIS — Z87891 Personal history of nicotine dependence: Secondary | ICD-10-CM

## 2014-05-19 DIAGNOSIS — Z880 Allergy status to penicillin: Secondary | ICD-10-CM

## 2014-05-19 DIAGNOSIS — K5720 Diverticulitis of large intestine with perforation and abscess without bleeding: Principal | ICD-10-CM | POA: Diagnosis present

## 2014-05-19 DIAGNOSIS — K219 Gastro-esophageal reflux disease without esophagitis: Secondary | ICD-10-CM | POA: Diagnosis present

## 2014-05-19 DIAGNOSIS — I10 Essential (primary) hypertension: Secondary | ICD-10-CM | POA: Diagnosis present

## 2014-05-19 DIAGNOSIS — Z888 Allergy status to other drugs, medicaments and biological substances status: Secondary | ICD-10-CM

## 2014-05-19 NOTE — ED Notes (Signed)
Patient here with complaint of left lower back pain and left lower abdominal pain which began today. States history of diverticulitis with hospitalization and similar concurrent symptoms.

## 2014-05-20 ENCOUNTER — Inpatient Hospital Stay (HOSPITAL_COMMUNITY)
Admission: EM | Admit: 2014-05-20 | Discharge: 2014-05-24 | DRG: 392 | Disposition: A | Discharge status: Home / Self Care | Payer: BLUE CROSS/BLUE SHIELD | Attending: Surgery | Admitting: Surgery

## 2014-05-20 ENCOUNTER — Encounter (HOSPITAL_COMMUNITY): Payer: Self-pay | Admitting: Radiology

## 2014-05-20 ENCOUNTER — Ambulatory Visit: Payer: Self-pay

## 2014-05-20 ENCOUNTER — Emergency Department (HOSPITAL_COMMUNITY): Payer: BLUE CROSS/BLUE SHIELD

## 2014-05-20 DIAGNOSIS — I10 Essential (primary) hypertension: Secondary | ICD-10-CM | POA: Diagnosis present

## 2014-05-20 DIAGNOSIS — R1032 Left lower quadrant pain: Secondary | ICD-10-CM | POA: Diagnosis present

## 2014-05-20 DIAGNOSIS — Z880 Allergy status to penicillin: Secondary | ICD-10-CM | POA: Diagnosis not present

## 2014-05-20 DIAGNOSIS — K219 Gastro-esophageal reflux disease without esophagitis: Secondary | ICD-10-CM | POA: Diagnosis present

## 2014-05-20 DIAGNOSIS — K5720 Diverticulitis of large intestine with perforation and abscess without bleeding: Secondary | ICD-10-CM | POA: Diagnosis present

## 2014-05-20 DIAGNOSIS — Z79899 Other long term (current) drug therapy: Secondary | ICD-10-CM | POA: Diagnosis not present

## 2014-05-20 DIAGNOSIS — Z87891 Personal history of nicotine dependence: Secondary | ICD-10-CM | POA: Diagnosis not present

## 2014-05-20 DIAGNOSIS — Z888 Allergy status to other drugs, medicaments and biological substances status: Secondary | ICD-10-CM | POA: Diagnosis not present

## 2014-05-20 HISTORY — DX: Diverticulitis of large intestine with perforation and abscess without bleeding: K57.20

## 2014-05-20 LAB — CBC WITH DIFFERENTIAL/PLATELET
Basophils Absolute: 0 10*3/uL (ref 0.0–0.1)
Basophils Relative: 0 % (ref 0–1)
Eosinophils Absolute: 0.1 10*3/uL (ref 0.0–0.7)
Eosinophils Relative: 1 % (ref 0–5)
HCT: 40.7 % (ref 36.0–46.0)
Hemoglobin: 13.4 g/dL (ref 12.0–15.0)
Lymphocytes Relative: 27 % (ref 12–46)
Lymphs Abs: 3.2 10*3/uL (ref 0.7–4.0)
MCH: 29.9 pg (ref 26.0–34.0)
MCHC: 32.9 g/dL (ref 30.0–36.0)
MCV: 90.8 fL (ref 78.0–100.0)
Monocytes Absolute: 1.1 10*3/uL — ABNORMAL HIGH (ref 0.1–1.0)
Monocytes Relative: 9 % (ref 3–12)
Neutro Abs: 7.4 10*3/uL (ref 1.7–7.7)
Neutrophils Relative %: 63 % (ref 43–77)
Platelets: 349 10*3/uL (ref 150–400)
RBC: 4.48 MIL/uL (ref 3.87–5.11)
RDW: 12.6 % (ref 11.5–15.5)
WBC: 11.9 10*3/uL — ABNORMAL HIGH (ref 4.0–10.5)

## 2014-05-20 LAB — COMPREHENSIVE METABOLIC PANEL
ALBUMIN: 4.3 g/dL (ref 3.5–5.2)
ALT: 21 U/L (ref 0–35)
AST: 25 U/L (ref 0–37)
Alkaline Phosphatase: 41 U/L (ref 39–117)
Anion gap: 11 (ref 5–15)
BILIRUBIN TOTAL: 0.5 mg/dL (ref 0.3–1.2)
BUN: 9 mg/dL (ref 6–23)
CO2: 25 mmol/L (ref 19–32)
CREATININE: 0.93 mg/dL (ref 0.50–1.10)
Calcium: 10.1 mg/dL (ref 8.4–10.5)
Chloride: 103 mmol/L (ref 96–112)
GFR calc Af Amer: 85 mL/min — ABNORMAL LOW (ref >90)
GFR calc non Af Amer: 73 mL/min — ABNORMAL LOW (ref >90)
GLUCOSE: 118 mg/dL — AB (ref 70–99)
Potassium: 3.6 mmol/L (ref 3.5–5.1)
SODIUM: 139 mmol/L (ref 135–145)
TOTAL PROTEIN: 7.5 g/dL (ref 6.0–8.3)

## 2014-05-20 LAB — BASIC METABOLIC PANEL
Anion gap: 3 — ABNORMAL LOW (ref 5–15)
BUN: 7 mg/dL (ref 6–23)
CALCIUM: 9 mg/dL (ref 8.4–10.5)
CO2: 32 mmol/L (ref 19–32)
CREATININE: 0.85 mg/dL (ref 0.50–1.10)
Chloride: 103 mmol/L (ref 96–112)
GFR, EST NON AFRICAN AMERICAN: 82 mL/min — AB (ref >90)
Glucose, Bld: 100 mg/dL — ABNORMAL HIGH (ref 70–99)
Potassium: 3.7 mmol/L (ref 3.5–5.1)
Sodium: 138 mmol/L (ref 135–145)

## 2014-05-20 LAB — URINALYSIS, ROUTINE W REFLEX MICROSCOPIC
Bilirubin Urine: NEGATIVE
Glucose, UA: NEGATIVE mg/dL
Hgb urine dipstick: NEGATIVE
Ketones, ur: 15 mg/dL — AB
LEUKOCYTES UA: NEGATIVE
Nitrite: NEGATIVE
PROTEIN: NEGATIVE mg/dL
Specific Gravity, Urine: 1.022 (ref 1.005–1.030)
UROBILINOGEN UA: 0.2 mg/dL (ref 0.0–1.0)
pH: 5.5 (ref 5.0–8.0)

## 2014-05-20 LAB — CBC
HCT: 37.3 % (ref 36.0–46.0)
Hemoglobin: 12.3 g/dL (ref 12.0–15.0)
MCH: 29.9 pg (ref 26.0–34.0)
MCHC: 33 g/dL (ref 30.0–36.0)
MCV: 90.8 fL (ref 78.0–100.0)
PLATELETS: 306 10*3/uL (ref 150–400)
RBC: 4.11 MIL/uL (ref 3.87–5.11)
RDW: 12.6 % (ref 11.5–15.5)
WBC: 10.9 10*3/uL — ABNORMAL HIGH (ref 4.0–10.5)

## 2014-05-20 LAB — I-STAT CG4 LACTIC ACID, ED: Lactic Acid, Venous: 1.61 mmol/L (ref 0.5–2.0)

## 2014-05-20 MED ORDER — DIPHENHYDRAMINE HCL 12.5 MG/5ML PO ELIX: 12.5000 mg | ORAL_SOLUTION | Freq: Four times a day (QID) | ORAL | Status: DC | PRN

## 2014-05-20 MED ORDER — DIPHENHYDRAMINE HCL 50 MG/ML IJ SOLN: 12.5000 mg | Freq: Four times a day (QID) | INTRAMUSCULAR | Status: DC | PRN

## 2014-05-20 MED ORDER — PANTOPRAZOLE SODIUM 40 MG IV SOLR
40.0000 mg | Freq: Every day | INTRAVENOUS | Status: DC
  Administered 2014-05-20: 40 mg via INTRAVENOUS
  Filled 2014-05-20: qty 40

## 2014-05-20 MED ORDER — CIPROFLOXACIN IN D5W 400 MG/200ML IV SOLN
400.0000 mg | Freq: Once | INTRAVENOUS | Status: AC
  Administered 2014-05-20: 400 mg via INTRAVENOUS
  Filled 2014-05-20: qty 200

## 2014-05-20 MED ORDER — METRONIDAZOLE IN NACL 5-0.79 MG/ML-% IV SOLN
500.0000 mg | Freq: Once | INTRAVENOUS | Status: AC
  Administered 2014-05-20: 500 mg via INTRAVENOUS
  Filled 2014-05-20: qty 100

## 2014-05-20 MED ORDER — IOHEXOL 300 MG/ML  SOLN
25.0000 mL | Freq: Once | INTRAMUSCULAR | Status: AC | PRN
  Administered 2014-05-20: 25 mL via ORAL

## 2014-05-20 MED ORDER — POTASSIUM CHLORIDE IN NACL 20-0.9 MEQ/L-% IV SOLN
INTRAVENOUS | Status: DC
  Administered 2014-05-20 – 2014-05-23 (×4): via INTRAVENOUS
  Filled 2014-05-20 (×7): qty 1000

## 2014-05-20 MED ORDER — LISINOPRIL 10 MG PO TABS
10.0000 mg | ORAL_TABLET | Freq: Every day | ORAL | Status: DC
  Administered 2014-05-20 – 2014-05-24 (×5): 10 mg via ORAL
  Filled 2014-05-20 (×5): qty 1

## 2014-05-20 MED ORDER — ONDANSETRON HCL 4 MG/2ML IJ SOLN
4.0000 mg | Freq: Four times a day (QID) | INTRAMUSCULAR | Status: DC | PRN
  Administered 2014-05-20: 4 mg via INTRAVENOUS
  Filled 2014-05-20: qty 2

## 2014-05-20 MED ORDER — ONDANSETRON HCL 4 MG/2ML IJ SOLN
4.0000 mg | Freq: Once | INTRAMUSCULAR | Status: AC
  Administered 2014-05-20: 4 mg via INTRAVENOUS
  Filled 2014-05-20: qty 2

## 2014-05-20 MED ORDER — HYDROMORPHONE HCL 1 MG/ML IJ SOLN
1.0000 mg | INTRAMUSCULAR | Status: DC | PRN
  Administered 2014-05-20 – 2014-05-23 (×16): 1 mg via INTRAVENOUS
  Filled 2014-05-20 (×17): qty 1

## 2014-05-20 MED ORDER — SODIUM CHLORIDE 0.9 % IV SOLN
1.0000 g | INTRAVENOUS | Status: DC
  Administered 2014-05-20 – 2014-05-24 (×5): 1 g via INTRAVENOUS
  Filled 2014-05-20 (×6): qty 1

## 2014-05-20 MED ORDER — IOHEXOL 300 MG/ML  SOLN
100.0000 mL | Freq: Once | INTRAMUSCULAR | Status: AC | PRN
  Administered 2014-05-20: 100 mL via INTRAVENOUS

## 2014-05-20 MED ORDER — SODIUM CHLORIDE 0.9 % IV BOLUS (SEPSIS)
1000.0000 mL | Freq: Once | INTRAVENOUS | Status: AC
  Administered 2014-05-20: 1000 mL via INTRAVENOUS

## 2014-05-20 MED ORDER — ENOXAPARIN SODIUM 40 MG/0.4ML ~~LOC~~ SOLN
40.0000 mg | SUBCUTANEOUS | Status: DC
  Administered 2014-05-20 – 2014-05-24 (×5): 40 mg via SUBCUTANEOUS
  Filled 2014-05-20 (×5): qty 0.4

## 2014-05-20 MED ORDER — HYDROMORPHONE HCL 1 MG/ML IJ SOLN
0.5000 mg | Freq: Once | INTRAMUSCULAR | Status: AC
  Administered 2014-05-20: 0.5 mg via INTRAVENOUS
  Filled 2014-05-20: qty 1

## 2014-05-20 MED ORDER — MORPHINE SULFATE 4 MG/ML IJ SOLN
4.0000 mg | Freq: Once | INTRAMUSCULAR | Status: AC
  Administered 2014-05-20: 4 mg via INTRAVENOUS
  Filled 2014-05-20: qty 1

## 2014-05-20 NOTE — ED Notes (Signed)
Reported vomiting to Dr. Lita Mains. MD gives verbal order for 4mg  of zofran.

## 2014-05-20 NOTE — ED Notes (Signed)
Patient not in the room yet.  

## 2014-05-20 NOTE — ED Notes (Signed)
Dr. Yelverton at the bedside.  

## 2014-05-20 NOTE — ED Provider Notes (Signed)
CSN: [id number]     Arrival date & time 05/19/14  2349 History  This chart was scribed for Julianne Rice, MD by Rayfield Citizen, ED Scribe. This patient was seen in room B15C/B15C and the patient's care was started at 1:22 AM.    Chief Complaint  Patient presents with  . Abdominal Pain  . Back Pain   Patient is a 44 y.o. female presenting with abdominal pain and back pain. The history is provided by the patient. No language interpreter was used.  Abdominal Pain Associated symptoms: chills and constipation   Associated symptoms: no chest pain, no cough, no diarrhea, no dysuria, no fever, no hematuria, no nausea, no shortness of breath and no vomiting   Back Pain Associated symptoms: abdominal pain   Associated symptoms: no chest pain, no dysuria, no fever, no headaches, no numbness, no pelvic pain and no weakness      HPI Comments: [name] is a 44 y.o. female with past medical history of HTN, who presents to the Emergency Department complaining of sudden onset left lower abdominal pain and left lower back pain beginning this evening. Patient reports she had chills; she took Motrin, which improved her chills, Sh also reports recent decreased urination; she states that she has not had a BM today (which is unusual for her), but that her BM yesterday was slightly softer than normal. She denies blood in her stool, nausea, vomiting, dysuria. Patient with multiple previous episodes of diverticulitis. No surgical intervention. Patient states symptoms are similar to previous episodes.  GI is Dr. Oletta Lamas at [facility].   Past Medical History  Diagnosis Date  . History of syphilis   . Breast fibroadenoma   . History of ectopic pregnancy 2005    r salpyngectomy  . History of conization of cervix 2000  . H/O myomectomy 07-2006    robotic  . History of hysterectomy, supracervical 08-2008    robotic  . PMS (premenstrual syndrome)   . Back pain   . H/O sinusitis   . GERD (gastroesophageal reflux  disease)   . Hx of herpes simplex type 2 infection   . History of syphilis   . Abnormal Pap smear 2000    [facility] Biopsy  . H/O metrorrhagia 12/2006  . Hypertension   . JOINOMVE(720.9)    Past Surgical History  Procedure Laterality Date  . Cervical cone biopsy  2000  . Ectopic pregnancy surgery  2005    R salpyngectomy  . Myomectomy  2008    robotic  . Robotic assisted lap vaginal hysterectomy  2010    supracervical  . Polypectomy  2009  . Salpingectomy  2005  . Dilation and curettage of uterus  2009  . Abdominal hysterectomy    . Breast surgery  2004    fibroademoma  . Breast lumpectomy  10/11    rt-neg  . Trigger finger release Right 09/28/2012    Procedure: RELEASE TRIGGER FINGER/A-1 PULLEY RIGHT THUMB;  Surgeon: Jolyn Nap, MD;  Location: [facility];  Service: Orthopedics;  Laterality: Right;   Family History  Problem Relation Age of Onset  . Hypertension Paternal Grandmother   . Diabetes Paternal Grandmother   . Hypertension Maternal Grandmother   . Sarcoidosis Mother   . Hypertension Mother   . Thyroid disease Maternal Aunt   . Mental illness Cousin    History  Substance Use Topics  . Smoking status: Former Smoker -- 0.25 packs/day for 15 years    Types: Cigarettes  Quit date: 06/12/2011  . Smokeless tobacco: Never Used  . Alcohol Use: 1.5 oz/week    3 drink(s) per week   OB History    Gravida Para Term Preterm AB TAB SAB Ectopic Multiple Living   4 2 2  2 1  0 1 0 2     Review of Systems  Constitutional: Positive for chills. Negative for fever.  Respiratory: Negative for cough and shortness of breath.   Cardiovascular: Negative for chest pain.  Gastrointestinal: Positive for abdominal pain and constipation. Negative for nausea, vomiting and diarrhea.  Genitourinary: Negative for dysuria, frequency, hematuria, flank pain, difficulty urinating and pelvic pain.  Musculoskeletal: Positive for back pain. Negative for myalgias, neck pain  and neck stiffness.  Skin: Negative for rash and wound.  Neurological: Negative for dizziness, weakness, light-headedness, numbness and headaches.  All other systems reviewed and are negative.     Allergies  Keflex and Penicillins  Home Medications   Prior to Admission medications   Medication Sig Start Date End Date Taking? Authorizing Provider  Black Cohosh (REMIFEMIN PO) Take 2 tablets by mouth daily.    Historical Provider, MD  ciprofloxacin (CIPRO) 500 MG tablet Take 1 tablet (500 mg total) by mouth 2 (two) times daily. X 2 weeks 02/06/14   Ripudeep Krystal Eaton, MD  docusate sodium (COLACE) 100 MG capsule Take 1 capsule (100 mg total) by mouth at bedtime. 02/06/14   Ripudeep Krystal Eaton, MD  HYDROcodone-acetaminophen (NORCO/VICODIN) 5-325 MG per tablet Take 1 tablet by mouth every 6 (six) hours as needed for moderate pain. 02/06/14   Ripudeep Krystal Eaton, MD  lisinopril (PRINIVIL,ZESTRIL) 10 MG tablet Take 10 mg by mouth daily.    Historical Provider, MD  metroNIDAZOLE (FLAGYL) 500 MG tablet Take 1 tablet (500 mg total) by mouth 3 (three) times daily. X 2 weeks 02/06/14   Ripudeep Krystal Eaton, MD  Multiple Vitamins-Minerals (HAIR/SKIN/NAILS PO) Take 1 tablet by mouth daily.    Historical Provider, MD  ondansetron (ZOFRAN) 4 MG tablet Take 1 tablet (4 mg total) by mouth every 6 (six) hours as needed for nausea. 11/05/13   Nita Sells, MD  promethazine (PHENERGAN) 25 MG tablet Take 1 tablet (25 mg total) by mouth every 6 (six) hours as needed for nausea or vomiting. 02/06/14   Ripudeep Krystal Eaton, MD  saccharomyces boulardii (FLORASTOR) 250 MG capsule Take 1 capsule (250 mg total) by mouth 2 (two) times daily. 02/06/14   Ripudeep K Rai, MD   BP 143/80 mmHg  Pulse 100  Temp(Src) 98 F (36.7 C) (Oral)  Resp 17  SpO2 98% Physical Exam  Constitutional: She is oriented to person, place, and time. She appears well-developed and well-nourished. No distress.  HENT:  Head: Normocephalic and atraumatic.   Mouth/Throat: Oropharynx is clear and moist.  Eyes: EOM are normal. Pupils are equal, round, and reactive to light.  Neck: Normal range of motion. Neck supple.  Cardiovascular: Normal rate and regular rhythm.   Pulmonary/Chest: Effort normal and breath sounds normal. No respiratory distress. She has no wheezes. She has no rales. She exhibits no tenderness.  Abdominal: Soft. Bowel sounds are normal. She exhibits no distension and no mass. There is tenderness (tenderness to palpation in the right lower quadrant and the left lower quadrant. No rebound or guarding. No peritoneal signs. Left lower quadrant appears more tender than the right.). There is no rebound and no guarding.  Musculoskeletal: Normal range of motion. She exhibits tenderness. She exhibits no edema.  No CVA  tenderness bilaterally. Patient does have left inferior lumbar paraspinal tenderness to palpation. No midline thoracic or lumbar tenderness.  Neurological: She is alert and oriented to person, place, and time.  Results urine is without deficit. Sensation is grossly intact.   Skin: Skin is warm and dry. No rash noted. No erythema.  Psychiatric: She has a normal mood and affect. Her behavior is normal.  Nursing note and vitals reviewed.   ED Course  Procedures   DIAGNOSTIC STUDIES: Oxygen Saturation is 95% on RA, adequate by my interpretation.    COORDINATION OF CARE: 1:27 AM Discussed treatment plan with pt at bedside and pt agreed to plan.   Labs Review Labs Reviewed  COMPREHENSIVE METABOLIC PANEL - Abnormal; Notable for the following:    Glucose, Bld 118 (*)    GFR calc non Af Amer 73 (*)    GFR calc Af Amer 85 (*)    All other components within normal limits  CBC WITH DIFFERENTIAL/PLATELET - Abnormal; Notable for the following:    WBC 11.9 (*)    Monocytes Absolute 1.1 (*)    All other components within normal limits  URINALYSIS, ROUTINE W REFLEX MICROSCOPIC - Abnormal; Notable for the following:     APPearance CLOUDY (*)    Ketones, ur 15 (*)    All other components within normal limits  I-STAT CG4 LACTIC ACID, ED    Imaging Review Ct Abdomen Pelvis W Contrast  05/20/2014   CLINICAL DATA:  Acute onset of left lower quadrant abdominal pain and left lower back pain. Chills and constipation. Initial encounter.  EXAM: CT ABDOMEN AND PELVIS WITH CONTRAST  TECHNIQUE: Multidetector CT imaging of the abdomen and pelvis was performed using the standard protocol following bolus administration of intravenous contrast.  CONTRAST:  185mL OMNIPAQUE IOHEXOL 300 MG/ML  SOLN  COMPARISON:  CT of the abdomen and pelvis performed 02/04/2014  FINDINGS: Minimal right basilar atelectasis is noted.  The liver and spleen are unremarkable in appearance. The gallbladder is within normal limits. The pancreas and adrenal glands are unremarkable.  The kidneys are unremarkable in appearance. There is no evidence of hydronephrosis. No renal or ureteral stones are seen. No perinephric stranding is appreciated.  No free fluid is identified. The small bowel is unremarkable in appearance. The stomach is within normal limits. No acute vascular abnormalities are seen. Mild scattered calcification is seen along the distal abdominal aorta and its branches.  The appendix is normal in caliber and contains trace air, without evidence of appendicitis. The colon is partially filled with stool.  There appears to be localized perforation along the proximal sigmoid colon, with a 2.3 x 1.8 cm collection of extraluminal fluid and air noted adjacent to the proximal sigmoid colon, and surrounding soft tissue inflammation. There is mild associated wall thickening along the proximal sigmoid colon. Findings are compatible with diverticulitis, with associated contained perforation. Underlying diverticulosis is noted along the distal descending and proximal sigmoid colon.  The bladder is mildly distended and grossly unremarkable. The patient is status post  hysterectomy. No suspicious adnexal masses are seen. The ovaries are relatively symmetric, though minimal soft tissue inflammation is seen tracking adjacent to the left ovary and left ureter at the site of diverticulitis. No inguinal lymphadenopathy is seen.  No acute osseous abnormalities are identified.  IMPRESSION: 1. Acute diverticulitis at the proximal sigmoid colon, with a contained perforation. A 2.3 x 1.8 cm collection of extraluminal fluid and air is seen adjacent to the proximal sigmoid colon, with surrounding  soft tissue inflammation and mild colonic wall thickening. 2. Underlying diverticulosis along the distal descending and proximal sigmoid colon. 3. Minimal soft tissue inflammation tracks about the adjacent left ureter and near the left ovary. 4. Mild scattered calcification along the distal abdominal aorta and its branches.  These results were called by telephone at the time of interpretation on 05/20/2014 at 3:00 am to Dr. Julianne Rice, who verbally acknowledged these results.   Electronically Signed   By: Garald Balding M.D.   On: 05/20/2014 03:00     EKG Interpretation None      MDM   Final diagnoses:  LLQ pain  Diverticulitis of large intestine with perforation without bleeding    I personally performed the services described in this documentation, which was scribed in my presence. The recorded information has been reviewed and is accurate.  Discussed with general surgery who will see the patient in the emergency department.     Julianne Rice, MD 05/20/14 [phone]

## 2014-05-20 NOTE — ED Notes (Signed)
Patient states her last meal was last night around 8:30, chicken okra, and carrot sticks.

## 2014-05-20 NOTE — Progress Notes (Signed)
Patient ID: [name], female   DOB: June 12, 1970, 44 y.o.   MRN: [id number] Patient just admitted early this morning with diverticulitis.  Continues to have pain.  Will continue with conservative management today with NPO x ice chips and Iv Invanz.  Recheck labs in the morning and will follow.  Jazz Biddy E 05/20/2014 11:41 AM

## 2014-05-20 NOTE — ED Notes (Signed)
Called CT to report patient is finished with contrast and ready for transport

## 2014-05-20 NOTE — ED Notes (Signed)
Dr. Molli Posey at the bedside.

## 2014-05-20 NOTE — Progress Notes (Signed)
MEDICATION RELATED CONSULT NOTE - INITIAL   Pharmacy Consult for Ertapenem Indication: intra-abdominal infection  Allergies  Allergen Reactions  . Keflex [Cephalexin] Anaphylaxis  . Penicillins Anaphylaxis    Patient Measurements: Height: 5\' 4"  (162.6 cm) Weight: 196 lb (88.905 kg) IBW/kg (Calculated) : 54.7 Adjusted Body Weight:   Vital Signs: Temp: 98 F (36.7 C) (04/01 0504) Temp Source: Oral (04/01 0504) BP: 145/83 mmHg (04/01 0504) Pulse Rate: 99 (04/01 0504) Intake/Output from previous day: 03/31 0701 - 04/01 0700 In: 1000 [I.V.:1000] Out: -  Intake/Output from this shift: Total I/O In: 1000 [I.V.:1000] Out: -   Labs:  Recent Labs  05/20/14 0008  WBC 11.9*  HGB 13.4  HCT 40.7  PLT 349  CREATININE 0.93  ALBUMIN 4.3  PROT 7.5  AST 25  ALT 21  ALKPHOS 41  BILITOT 0.5   Estimated Creatinine Clearance: 82.5 mL/min (by C-G formula based on Cr of 0.93).   Microbiology: No results found for this or any previous visit (from the past 720 hour(s)).  Medical History: Past Medical History  Diagnosis Date  . History of syphilis   . Breast fibroadenoma   . History of ectopic pregnancy 2005    r salpyngectomy  . History of conization of cervix 2000  . H/O myomectomy 07-2006    robotic  . History of hysterectomy, supracervical 08-2008    robotic  . PMS (premenstrual syndrome)   . Back pain   . H/O sinusitis   . GERD (gastroesophageal reflux disease)   . Hx of herpes simplex type 2 infection   . History of syphilis   . Abnormal Pap smear 2000    [facility] Biopsy  . H/O metrorrhagia 12/2006  . Hypertension   . DHWYSHUO(372.9)     Assessment: 44yo female with recurrent hospitalizations for sigmoid diverticulitis through previous treatments of fluoroquinolones plus flagyl. Dr. Georgette Dover ordered ertapenem, which is flagged for allergies. Pt has reported anaphylactic reaction to PCNs and cephalosporins. This was brought to MD's attention and based on failed  previous treatment with FQ/Flagyl, wants to try ertapenem.  Andrey Cota. Diona Foley, PharmD Clinical Pharmacist Pager [phone] 05/20/2014,5:29 AM

## 2014-05-20 NOTE — ED Notes (Signed)
Spoke to Dr. Lita Mains in regards to patient's complaint of pain, unrelieved by morphine. MD gives verbal order for 0.5 mg of dilaudid.

## 2014-05-20 NOTE — H&P (Signed)
[name] is an 44 y.o. female.   Chief Complaint: LLQ abdominal pain HPI: This is a 44 yo female who presents with several attacks of sigmoid diverticulitis over the last 18 months.  She began having LLQ abdominal pain about 12 hours ago that worsened to the point that she came in to the ED.  She denies any fever, nausea, vomiting, diarrhea, or constipation.  She underwent a colonoscopy by Dr. Leonie Douglas - [facility] last year.  We cannot see the report, but the patient states that she had a few polyps that were removed.  Her most recent episode was in December 2015, for which she was admitted by [facility].  No surgical evaluation yet.  She was noted to have an elevated WBC and a CT scan showed signs of sigmoid diverticulitis with an area of perforation with contained abscess.    Past Medical History  Diagnosis Date  . History of syphilis   . Breast fibroadenoma   . History of ectopic pregnancy 2005    r salpyngectomy  . History of conization of cervix 2000  . H/O myomectomy 07-2006    robotic  . History of hysterectomy, supracervical 08-2008    robotic  . PMS (premenstrual syndrome)   . Back pain   . H/O sinusitis   . GERD (gastroesophageal reflux disease)   . Hx of herpes simplex type 2 infection   . History of syphilis   . Abnormal Pap smear 2000    [facility] Biopsy  . H/O metrorrhagia 12/2006  . Hypertension   . WCBJSEGB(151.7)     Past Surgical History  Procedure Laterality Date  . Cervical cone biopsy  2000  . Ectopic pregnancy surgery  2005    R salpyngectomy  . Myomectomy  2008    robotic  . Robotic assisted lap vaginal hysterectomy  2010    supracervical  . Polypectomy  2009  . Salpingectomy  2005  . Dilation and curettage of uterus  2009  . Abdominal hysterectomy    . Breast surgery  2004    fibroademoma  . Breast lumpectomy  10/11    rt-neg  . Trigger finger release Right 09/28/2012    Procedure: RELEASE TRIGGER FINGER/A-1 PULLEY RIGHT THUMB;  Surgeon: Jolyn Nap, MD;  Location: [facility];  Service: Orthopedics;  Laterality: Right;    Family History  Problem Relation Age of Onset  . Hypertension Paternal Grandmother   . Diabetes Paternal Grandmother   . Hypertension Maternal Grandmother   . Sarcoidosis Mother   . Hypertension Mother   . Thyroid disease Maternal Aunt   . Mental illness Cousin    Social History:  reports that she quit smoking about 2 years ago. Her smoking use included Cigarettes. She has a 3.75 pack-year smoking history. She has never used smokeless tobacco. She reports that she drinks about 1.5 oz of alcohol per week. She reports that she does not use illicit drugs.  Allergies:  Allergies  Allergen Reactions  . Keflex [Cephalexin] Anaphylaxis  . Penicillins Anaphylaxis    Prior to Admission medications   Medication Sig Start Date End Date Taking? Authorizing Provider  Black Cohosh (REMIFEMIN PO) Take 2 tablets by mouth daily.    Historical Provider, MD  ciprofloxacin (CIPRO) 500 MG tablet Take 1 tablet (500 mg total) by mouth 2 (two) times daily. X 2 weeks 02/06/14   Ripudeep Krystal Eaton, MD  docusate sodium (COLACE) 100 MG capsule Take 1 capsule (100 mg total) by  mouth at bedtime. 02/06/14   Ripudeep Krystal Eaton, MD  HYDROcodone-acetaminophen (NORCO/VICODIN) 5-325 MG per tablet Take 1 tablet by mouth every 6 (six) hours as needed for moderate pain. 02/06/14   Ripudeep Krystal Eaton, MD  lisinopril (PRINIVIL,ZESTRIL) 10 MG tablet Take 10 mg by mouth daily.    Historical Provider, MD  metroNIDAZOLE (FLAGYL) 500 MG tablet Take 1 tablet (500 mg total) by mouth 3 (three) times daily. X 2 weeks 02/06/14   Ripudeep Krystal Eaton, MD  Multiple Vitamins-Minerals (HAIR/SKIN/NAILS PO) Take 1 tablet by mouth daily.    Historical Provider, MD  ondansetron (ZOFRAN) 4 MG tablet Take 1 tablet (4 mg total) by mouth every 6 (six) hours as needed for nausea. 11/05/13   Nita Sells, MD  promethazine (PHENERGAN) 25 MG tablet Take 1 tablet (25  mg total) by mouth every 6 (six) hours as needed for nausea or vomiting. 02/06/14   Ripudeep Krystal Eaton, MD  saccharomyces boulardii (FLORASTOR) 250 MG capsule Take 1 capsule (250 mg total) by mouth 2 (two) times daily. 02/06/14   Ripudeep Krystal Eaton, MD     Results for orders placed or performed during the hospital encounter of 05/20/14 (from the past 48 hour(s))  Comprehensive metabolic panel     Status: Abnormal   Collection Time: 05/20/14 12:08 AM  Result Value Ref Range   Sodium 139 135 - 145 mmol/L   Potassium 3.6 3.5 - 5.1 mmol/L   Chloride 103 96 - 112 mmol/L   CO2 25 19 - 32 mmol/L   Glucose, Bld 118 (H) 70 - 99 mg/dL   BUN 9 6 - 23 mg/dL   Creatinine, Ser 0.93 0.50 - 1.10 mg/dL   Calcium 10.1 8.4 - 10.5 mg/dL   Total Protein 7.5 6.0 - 8.3 g/dL   Albumin 4.3 3.5 - 5.2 g/dL   AST 25 0 - 37 U/L   ALT 21 0 - 35 U/L   Alkaline Phosphatase 41 39 - 117 U/L   Total Bilirubin 0.5 0.3 - 1.2 mg/dL   GFR calc non Af Amer 73 (L) >90 mL/min   GFR calc Af Amer 85 (L) >90 mL/min    Comment: (NOTE) The eGFR has been calculated using the CKD EPI equation. This calculation has not been validated in all clinical situations. eGFR's persistently <90 mL/min signify possible Chronic Kidney Disease.    Anion gap 11 5 - 15  CBC with Differential     Status: Abnormal   Collection Time: 05/20/14 12:08 AM  Result Value Ref Range   WBC 11.9 (H) 4.0 - 10.5 K/uL   RBC 4.48 3.87 - 5.11 MIL/uL   Hemoglobin 13.4 12.0 - 15.0 g/dL   HCT 40.7 36.0 - 46.0 %   MCV 90.8 78.0 - 100.0 fL   MCH 29.9 26.0 - 34.0 pg   MCHC 32.9 30.0 - 36.0 g/dL   RDW 12.6 11.5 - 15.5 %   Platelets 349 150 - 400 K/uL   Neutrophils Relative % 63 43 - 77 %   Neutro Abs 7.4 1.7 - 7.7 K/uL   Lymphocytes Relative 27 12 - 46 %   Lymphs Abs 3.2 0.7 - 4.0 K/uL   Monocytes Relative 9 3 - 12 %   Monocytes Absolute 1.1 (H) 0.1 - 1.0 K/uL   Eosinophils Relative 1 0 - 5 %   Eosinophils Absolute 0.1 0.0 - 0.7 K/uL   Basophils Relative 0 0 -  1 %   Basophils Absolute 0.0 0.0 - 0.1 K/uL  I-Stat CG4 Lactic Acid, ED     Status: None   Collection Time: 05/20/14 12:23 AM  Result Value Ref Range   Lactic Acid, Venous 1.61 0.5 - 2.0 mmol/L  Urinalysis, Routine w reflex microscopic     Status: Abnormal   Collection Time: 05/20/14 12:28 AM  Result Value Ref Range   Color, Urine YELLOW YELLOW   APPearance CLOUDY (A) CLEAR   Specific Gravity, Urine 1.022 1.005 - 1.030   pH 5.5 5.0 - 8.0   Glucose, UA NEGATIVE NEGATIVE mg/dL   Hgb urine dipstick NEGATIVE NEGATIVE   Bilirubin Urine NEGATIVE NEGATIVE   Ketones, ur 15 (A) NEGATIVE mg/dL   Protein, ur NEGATIVE NEGATIVE mg/dL   Urobilinogen, UA 0.2 0.0 - 1.0 mg/dL   Nitrite NEGATIVE NEGATIVE   Leukocytes, UA NEGATIVE NEGATIVE    Comment: MICROSCOPIC NOT DONE ON URINES WITH NEGATIVE PROTEIN, BLOOD, LEUKOCYTES, NITRITE, OR GLUCOSE <1000 mg/dL.   Ct Abdomen Pelvis W Contrast  05/20/2014   CLINICAL DATA:  Acute onset of left lower quadrant abdominal pain and left lower back pain. Chills and constipation. Initial encounter.  EXAM: CT ABDOMEN AND PELVIS WITH CONTRAST  TECHNIQUE: Multidetector CT imaging of the abdomen and pelvis was performed using the standard protocol following bolus administration of intravenous contrast.  CONTRAST:  151m OMNIPAQUE IOHEXOL 300 MG/ML  SOLN  COMPARISON:  CT of the abdomen and pelvis performed 02/04/2014  FINDINGS: Minimal right basilar atelectasis is noted.  The liver and spleen are unremarkable in appearance. The gallbladder is within normal limits. The pancreas and adrenal glands are unremarkable.  The kidneys are unremarkable in appearance. There is no evidence of hydronephrosis. No renal or ureteral stones are seen. No perinephric stranding is appreciated.  No free fluid is identified. The small bowel is unremarkable in appearance. The stomach is within normal limits. No acute vascular abnormalities are seen. Mild scattered calcification is seen along the distal  abdominal aorta and its branches.  The appendix is normal in caliber and contains trace air, without evidence of appendicitis. The colon is partially filled with stool.  There appears to be localized perforation along the proximal sigmoid colon, with a 2.3 x 1.8 cm collection of extraluminal fluid and air noted adjacent to the proximal sigmoid colon, and surrounding soft tissue inflammation. There is mild associated wall thickening along the proximal sigmoid colon. Findings are compatible with diverticulitis, with associated contained perforation. Underlying diverticulosis is noted along the distal descending and proximal sigmoid colon.  The bladder is mildly distended and grossly unremarkable. The patient is status post hysterectomy. No suspicious adnexal masses are seen. The ovaries are relatively symmetric, though minimal soft tissue inflammation is seen tracking adjacent to the left ovary and left ureter at the site of diverticulitis. No inguinal lymphadenopathy is seen.  No acute osseous abnormalities are identified.  IMPRESSION: 1. Acute diverticulitis at the proximal sigmoid colon, with a contained perforation. A 2.3 x 1.8 cm collection of extraluminal fluid and air is seen adjacent to the proximal sigmoid colon, with surrounding soft tissue inflammation and mild colonic wall thickening. 2. Underlying diverticulosis along the distal descending and proximal sigmoid colon. 3. Minimal soft tissue inflammation tracks about the adjacent left ureter and near the left ovary. 4. Mild scattered calcification along the distal abdominal aorta and its branches.  These results were called by telephone at the time of interpretation on 05/20/2014 at 3:00 am to Dr. DJulianne Rice who verbally acknowledged these results.   Electronically Signed   By:  Garald Balding M.D.   On: 05/20/2014 03:00    Review of Systems  Constitutional: Negative for weight loss.  HENT: Negative for ear discharge, ear pain, hearing loss and  tinnitus.   Eyes: Negative for blurred vision, double vision, photophobia and pain.  Respiratory: Negative for cough, sputum production and shortness of breath.   Cardiovascular: Negative for chest pain.  Gastrointestinal: Positive for abdominal pain. Negative for nausea and vomiting.  Genitourinary: Negative for dysuria, urgency, frequency and flank pain.  Musculoskeletal: Negative for myalgias, back pain, joint pain, falls and neck pain.  Neurological: Negative for dizziness, tingling, sensory change, focal weakness, loss of consciousness and headaches.  Endo/Heme/Allergies: Does not bruise/bleed easily.  Psychiatric/Behavioral: Negative for depression, memory loss and substance abuse. The patient is not nervous/anxious.     Blood pressure 143/80, pulse 100, temperature 98 F (36.7 C), temperature source Oral, resp. rate 17, SpO2 98 %. Physical Exam  WDWN in NAD HEENT:  EOMI, sclera anicteric Neck:  No masses, no thyromegaly Lungs:  CTA bilaterally; normal respiratory effort CV:  Regular rate and rhythm; no murmurs Abd:  +bowel sounds, soft, tender in LLQ extending across towards RLQ; no rebound or guarding Ext:  Well-perfused; no edema Skin:  Warm, dry; no sign of jaundice  Assessment/Plan Proximal sigmoid diverticulitis with an area of contained perforation, but no generalized peritonitis and no signs of sepsis Multiple recent attacks  Will try to treat her non-operatively this admission with NPO, IV abx.  Hopefully, she will improve and can have sigmoid resection electively as a single-stage operation.  If not, she would probably have a Hartman's procedure with temporary colostomy.  She has had multiple courses of Cipro/ Flagyl, so we will use Invanz this time.  Alora Gorey K. 05/20/2014, 3:58 AM

## 2014-05-20 NOTE — ED Notes (Signed)
Transporting patient to new room assignment. 

## 2014-05-21 MED ORDER — HYDROCODONE-ACETAMINOPHEN 7.5-325 MG PO TABS
1.0000 | ORAL_TABLET | Freq: Four times a day (QID) | ORAL | Status: DC | PRN
  Administered 2014-05-22 (×2): 1 via ORAL
  Filled 2014-05-21 (×3): qty 1

## 2014-05-21 MED ORDER — PANTOPRAZOLE SODIUM 40 MG PO TBEC
40.0000 mg | DELAYED_RELEASE_TABLET | Freq: Every day | ORAL | Status: DC
  Administered 2014-05-21 – 2014-05-24 (×4): 40 mg via ORAL
  Filled 2014-05-21 (×4): qty 1

## 2014-05-21 NOTE — Progress Notes (Signed)
Subjective: Alert and stable. Afebrile. Passing flatus but no stool. Denies nausea and wants to start diet Says she still has pain in lower abdomen. Says her ankles are swelling. WBC 10,900. Hemoglobin 12.3. Electrolytes normal.  Objective: Vital signs in last 24 hours: Temp:  [98.3 F (36.8 C)-99.2 F (37.3 C)] 98.3 F (36.8 C) (04/02 0503) Pulse Rate:  [95-107] 107 (04/02 0503) Resp:  [16-18] 17 (04/02 0503) BP: (105-131)/(61-72) 105/63 mmHg (04/02 0503) SpO2:  [93 %-100 %] 93 % (04/02 0503) Last BM Date: 05/18/14  Intake/Output from previous day: 04/01 0701 - 04/02 0700 In: 2012 [P.O.:230; I.V.:1782] Out: 1050 [Urine:700; Emesis/NG output:350] Intake/Output this shift:    General appearance: Alert. Cooperative. Does not appear to be in any distress. Resp: clear to auscultation bilaterally GI: Soft. Not distended. Tender across lower abdomen, but no mass or guarding. Healed trocar sites noted from prior robotic hysterectomy. Extremities: this fall all 4 x-rays well without motor or sensory deficit. There may be trace ankle edema. Very subtle.  Lab Results:   Recent Labs  05/20/14 0008 05/20/14 0627  WBC 11.9* 10.9*  HGB 13.4 12.3  HCT 40.7 37.3  PLT 349 306   BMET  Recent Labs  05/20/14 0008 05/20/14 0627  NA 139 138  K 3.6 3.7  CL 103 103  CO2 25 32  GLUCOSE 118* 100*  BUN 9 7  CREATININE 0.93 0.85  CALCIUM 10.1 9.0   PT/INR No results for input(s): LABPROT, INR in the last 72 hours. ABG No results for input(s): PHART, HCO3 in the last 72 hours.  Invalid input(s): PCO2, PO2  Studies/Results: Ct Abdomen Pelvis W Contrast  05/20/2014   CLINICAL DATA:  Acute onset of left lower quadrant abdominal pain and left lower back pain. Chills and constipation. Initial encounter.  EXAM: CT ABDOMEN AND PELVIS WITH CONTRAST  TECHNIQUE: Multidetector CT imaging of the abdomen and pelvis was performed using the standard protocol following bolus administration  of intravenous contrast.  CONTRAST:  131mL OMNIPAQUE IOHEXOL 300 MG/ML  SOLN  COMPARISON:  CT of the abdomen and pelvis performed 02/04/2014  FINDINGS: Minimal right basilar atelectasis is noted.  The liver and spleen are unremarkable in appearance. The gallbladder is within normal limits. The pancreas and adrenal glands are unremarkable.  The kidneys are unremarkable in appearance. There is no evidence of hydronephrosis. No renal or ureteral stones are seen. No perinephric stranding is appreciated.  No free fluid is identified. The small bowel is unremarkable in appearance. The stomach is within normal limits. No acute vascular abnormalities are seen. Mild scattered calcification is seen along the distal abdominal aorta and its branches.  The appendix is normal in caliber and contains trace air, without evidence of appendicitis. The colon is partially filled with stool.  There appears to be localized perforation along the proximal sigmoid colon, with a 2.3 x 1.8 cm collection of extraluminal fluid and air noted adjacent to the proximal sigmoid colon, and surrounding soft tissue inflammation. There is mild associated wall thickening along the proximal sigmoid colon. Findings are compatible with diverticulitis, with associated contained perforation. Underlying diverticulosis is noted along the distal descending and proximal sigmoid colon.  The bladder is mildly distended and grossly unremarkable. The patient is status post hysterectomy. No suspicious adnexal masses are seen. The ovaries are relatively symmetric, though minimal soft tissue inflammation is seen tracking adjacent to the left ovary and left ureter at the site of diverticulitis. No inguinal lymphadenopathy is seen.  No acute osseous abnormalities  are identified.  IMPRESSION: 1. Acute diverticulitis at the proximal sigmoid colon, with a contained perforation. A 2.3 x 1.8 cm collection of extraluminal fluid and air is seen adjacent to the proximal sigmoid  colon, with surrounding soft tissue inflammation and mild colonic wall thickening. 2. Underlying diverticulosis along the distal descending and proximal sigmoid colon. 3. Minimal soft tissue inflammation tracks about the adjacent left ureter and near the left ovary. 4. Mild scattered calcification along the distal abdominal aorta and its branches.  These results were called by telephone at the time of interpretation on 05/20/2014 at 3:00 am to Dr. Julianne Rice, who verbally acknowledged these results.   Electronically Signed   By: Garald Balding M.D.   On: 05/20/2014 03:00    Anti-infectives: Anti-infectives    Start     Dose/Rate Route Frequency Ordered Stop   05/20/14 0600  ertapenem (INVANZ) 1 g in sodium chloride 0.9 % 50 mL IVPB     1 g 100 mL/hr over 30 Minutes Intravenous Every 24 hours 05/20/14 0514     05/20/14 0315  ciprofloxacin (CIPRO) IVPB 400 mg     400 mg 200 mL/hr over 60 Minutes Intravenous  Once 05/20/14 0300 05/20/14 0414   05/20/14 0315  metroNIDAZOLE (FLAGYL) IVPB 500 mg     500 mg 100 mL/hr over 60 Minutes Intravenous  Once 05/20/14 0300 05/20/14 0416      Assessment/Plan:   Acute diverticulitis. Microperforation, appears radiographically and clinically well contained without peritonitis. This is a recurrent problem, this is now her third hospitalization in his 7 months. No need for acute surgical intervention, but she may benefit from elective sigmoid colectomy in the future. We discussed this and I told her this is a decision that can be made later as an outpatient    for now we will decrease her IV rate and start a clear liquid diet and continue antibiotics Ambulate  History of robotic hysterectomy, supracervical History of ectopic pregnancy Hypertension-home meds     LOS: 1 day    Ralston Venus M 05/21/2014

## 2014-05-22 LAB — CBC
HCT: 36.4 % (ref 36.0–46.0)
HEMOGLOBIN: 11.7 g/dL — AB (ref 12.0–15.0)
MCH: 29.8 pg (ref 26.0–34.0)
MCHC: 32.1 g/dL (ref 30.0–36.0)
MCV: 92.6 fL (ref 78.0–100.0)
Platelets: 331 10*3/uL (ref 150–400)
RBC: 3.93 MIL/uL (ref 3.87–5.11)
RDW: 12.8 % (ref 11.5–15.5)
WBC: 11.4 10*3/uL — ABNORMAL HIGH (ref 4.0–10.5)

## 2014-05-22 MED ORDER — POLYETHYLENE GLYCOL 3350 17 G PO PACK
17.0000 g | PACK | Freq: Every day | ORAL | Status: DC
  Administered 2014-05-22 – 2014-05-24 (×3): 17 g via ORAL
  Filled 2014-05-22 (×3): qty 1

## 2014-05-22 MED ORDER — DOCUSATE SODIUM 100 MG PO CAPS
100.0000 mg | ORAL_CAPSULE | Freq: Two times a day (BID) | ORAL | Status: DC
  Administered 2014-05-22 – 2014-05-24 (×4): 100 mg via ORAL
  Filled 2014-05-22 (×4): qty 1

## 2014-05-22 NOTE — Progress Notes (Signed)
  Subjective: Still has lower abdominal pain but it is slowly getting better. Tolerated clear liquids. Passing flatus but no stool. Afebrile. Vital signs stable. Voiding without difficulty. Ambulatory. Morning lab work pending.  Objective: Vital signs in last 24 hours: Temp:  [97.9 F (36.6 C)-99 F (37.2 C)] 98.5 F (36.9 C) (04/03 0516) Pulse Rate:  [100-109] 100 (04/03 0516) Resp:  [16-17] 17 (04/03 0516) BP: (120-134)/(64-72) 120/71 mmHg (04/03 0516) SpO2:  [94 %-100 %] 94 % (04/03 0516) Last BM Date: 05/18/14  Intake/Output from previous day: 04/02 0701 - 04/03 0700 In: 600 [P.O.:600] Out: 1150 [Urine:1150] Intake/Output this shift:    General appearance: Alert and cooperative. Asking lots of questions about future management. No distress. Resp: clear to auscultation bilaterally GI: Abdomen is soft and nondistended. Subjectively tender across lower abdomen but no mass or guarding. Healed trocar sites from prior robotic hysterectomy  Lab Results:   Recent Labs  05/20/14 0008 05/20/14 0627  WBC 11.9* 10.9*  HGB 13.4 12.3  HCT 40.7 37.3  PLT 349 306   BMET  Recent Labs  05/20/14 0008 05/20/14 0627  NA 139 138  K 3.6 3.7  CL 103 103  CO2 25 32  GLUCOSE 118* 100*  BUN 9 7  CREATININE 0.93 0.85  CALCIUM 10.1 9.0   PT/INR No results for input(s): LABPROT, INR in the last 72 hours. ABG No results for input(s): PHART, HCO3 in the last 72 hours.  Invalid input(s): PCO2, PO2  Studies/Results: No results found.  Anti-infectives: Anti-infectives    Start     Dose/Rate Route Frequency Ordered Stop   05/20/14 0600  ertapenem (INVANZ) 1 g in sodium chloride 0.9 % 50 mL IVPB     1 g 100 mL/hr over 30 Minutes Intravenous Every 24 hours 05/20/14 0514     05/20/14 0315  ciprofloxacin (CIPRO) IVPB 400 mg     400 mg 200 mL/hr over 60 Minutes Intravenous  Once 05/20/14 0300 05/20/14 0414   05/20/14 0315  metroNIDAZOLE (FLAGYL) IVPB 500 mg     500 mg 100  mL/hr over 60 Minutes Intravenous  Once 05/20/14 0300 05/20/14 0416      Assessment/Plan:   Acute diverticulitis with contained Microperforation, appears radiographically and clinically well contained without peritonitis. This is a recurrent problem, this is now her third hospitalization in  7 months. No need for acute surgical intervention, but she may benefit from elective sigmoid colectomy in the future. We discussed this and I told her this is a decision that can be made later as an outpatient  advance to full liquid diet Check labs Continue IV Invanz. Ambulate  History of robotic hysterectomy, supracervical History of ectopic pregnancy Hypertension-home meds   LOS: 2 days    Kody Vigil M 05/22/2014

## 2014-05-23 LAB — CBC
HCT: 37.1 % (ref 36.0–46.0)
HEMOGLOBIN: 12 g/dL (ref 12.0–15.0)
MCH: 29.6 pg (ref 26.0–34.0)
MCHC: 32.3 g/dL (ref 30.0–36.0)
MCV: 91.6 fL (ref 78.0–100.0)
Platelets: 307 10*3/uL (ref 150–400)
RBC: 4.05 MIL/uL (ref 3.87–5.11)
RDW: 12.6 % (ref 11.5–15.5)
WBC: 6.5 10*3/uL (ref 4.0–10.5)

## 2014-05-23 MED ORDER — METHOCARBAMOL 750 MG PO TABS
750.0000 mg | ORAL_TABLET | Freq: Three times a day (TID) | ORAL | Status: DC | PRN
  Administered 2014-05-23: 750 mg via ORAL
  Filled 2014-05-23: qty 1

## 2014-05-23 MED ORDER — HYDROCODONE-ACETAMINOPHEN 7.5-325 MG PO TABS
1.0000 | ORAL_TABLET | ORAL | Status: DC | PRN
  Administered 2014-05-23 – 2014-05-24 (×5): 1 via ORAL
  Filled 2014-05-23 (×5): qty 1

## 2014-05-23 MED ORDER — HYDROMORPHONE HCL 1 MG/ML IJ SOLN
1.0000 mg | INTRAMUSCULAR | Status: DC | PRN
  Filled 2014-05-23: qty 1

## 2014-05-23 MED ORDER — HYDROMORPHONE HCL 1 MG/ML IJ SOLN: 1.0000 mg | INTRAMUSCULAR | Status: DC | PRN

## 2014-05-23 NOTE — Progress Notes (Signed)
[facility] Progress Note     Subjective: Pt's pain improved, but still requiring IV pain medications frequently.  No N/V, ambulating well.  Tolerating full liquid diet.  Having flatus, No BM since 3/30.  Objective: Vital signs in last 24 hours: Temp:  [97.7 F (36.5 C)-98.9 F (37.2 C)] 97.9 F (36.6 C) (04/04 0426) Pulse Rate:  [86-88] 87 (04/04 0426) Resp:  [16-17] 16 (04/04 0426) BP: (127-151)/(70-80) 151/80 mmHg (04/04 0426) SpO2:  [98 %-100 %] 98 % (04/04 0426) Last BM Date: 05/18/14  Intake/Output from previous day: 04/03 0701 - 04/04 0700 In: 2460 [P.O.:2060; I.V.:400] Out: 2050 [Urine:2050] Intake/Output this shift:    PE: Gen:  Alert, NAD, pleasant Abd: Soft, mild distension, mild tenderness in LLQ, +BS, no HSM   Lab Results:   Recent Labs  05/22/14 0620 05/23/14 0422  WBC 11.4* 6.5  HGB 11.7* 12.0  HCT 36.4 37.1  PLT 331 307   BMET No results for input(s): NA, K, CL, CO2, GLUCOSE, BUN, CREATININE, CALCIUM in the last 72 hours. PT/INR No results for input(s): LABPROT, INR in the last 72 hours. CMP     Component Value Date/Time   NA 138 05/20/2014 0627   K 3.7 05/20/2014 0627   CL 103 05/20/2014 0627   CO2 32 05/20/2014 0627   GLUCOSE 100* 05/20/2014 0627   BUN 7 05/20/2014 0627   CREATININE 0.85 05/20/2014 0627   CALCIUM 9.0 05/20/2014 0627   PROT 7.5 05/20/2014 0008   ALBUMIN 4.3 05/20/2014 0008   AST 25 05/20/2014 0008   ALT 21 05/20/2014 0008   ALKPHOS 41 05/20/2014 0008   BILITOT 0.5 05/20/2014 0008   GFRNONAA 82* 05/20/2014 0627   GFRAA >90 05/20/2014 0627   Lipase     Component Value Date/Time   LIPASE 39 02/04/2014 1625       Studies/Results: No results found.  Anti-infectives: Anti-infectives    Start     Dose/Rate Route Frequency Ordered Stop   05/20/14 0600  ertapenem (INVANZ) 1 g in sodium chloride 0.9 % 50 mL IVPB     1 g 100 mL/hr over 30 Minutes Intravenous Every 24 hours 05/20/14 0514     05/20/14 0315  ciprofloxacin (CIPRO) IVPB 400 mg     400 mg 200 mL/hr over 60 Minutes Intravenous  Once 05/20/14 0300 05/20/14 0414   05/20/14 0315  metroNIDAZOLE (FLAGYL) IVPB 500 mg     500 mg 100 mL/hr over 60 Minutes Intravenous  Once 05/20/14 0300 05/20/14 0416       Assessment/Plan Acute diverticulitis with contained Microperforation -Appears radiographically and clinically well contained without peritonitis. -This is a recurrent problem, this is now her third hospitalization in7 months. -No need for acute surgical intervention, but she may benefit from elective sigmoid colectomy in the future.  She appears willing to proceed with this after this episode is resolved.  Will have her follow up in the office with Dr. Georgette Dover. -On full liquids, await improvement in pain prior to advancing to soft diet -Continue IV Invanz Day #4, will likely need 14-21 days of treatment given recurrent nature -Ambulate and IS -WBC normal at 6.5 -Lovenox and SCD's -Try robaxin and ice and limit narcotics (still taking dilaudid every 2-4 hours)  History of robotic hysterectomy, supracervical History of ectopic pregnancy Hypertension-home meds    LOS: 3 days    DORT, Romaine Maciolek 05/23/2014, 8:10 AM Pager: [phone]

## 2014-05-24 LAB — URINALYSIS, ROUTINE W REFLEX MICROSCOPIC
Bilirubin Urine: NEGATIVE
Glucose, UA: NEGATIVE mg/dL
Hgb urine dipstick: NEGATIVE
KETONES UR: NEGATIVE mg/dL
LEUKOCYTES UA: NEGATIVE
NITRITE: NEGATIVE
PH: 7.5 (ref 5.0–8.0)
PROTEIN: NEGATIVE mg/dL
Specific Gravity, Urine: 1.016 (ref 1.005–1.030)
Urobilinogen, UA: 1 mg/dL (ref 0.0–1.0)

## 2014-05-24 MED ORDER — CIPROFLOXACIN HCL 500 MG PO TABS: 500.0000 mg | ORAL_TABLET | Freq: Two times a day (BID) | ORAL | Status: DC

## 2014-05-24 MED ORDER — POLYETHYLENE GLYCOL 3350 17 G PO PACK: 17.0000 g | PACK | Freq: Every day | ORAL | Status: DC | PRN

## 2014-05-24 MED ORDER — METRONIDAZOLE 500 MG PO TABS: 500.0000 mg | ORAL_TABLET | Freq: Three times a day (TID) | ORAL | Status: DC

## 2014-05-24 MED ORDER — HYDROCODONE-ACETAMINOPHEN 7.5-325 MG PO TABS: 0.5000 | ORAL_TABLET | Freq: Four times a day (QID) | ORAL | Status: DC | PRN

## 2014-05-24 NOTE — Discharge Summary (Signed)
[facility] Discharge Summary   Patient ID: [name] MRN: [id number] DOB/AGE: 1970/04/30 44 y.o.  Admit date: 05/20/2014 Discharge date: 05/24/2014  Admitting Diagnosis: Diverticulitis of colon with perforation  Discharge Diagnosis Patient Active Problem List   Diagnosis Date Noted  . Diverticulitis of colon with perforation 05/20/2014  . Lower abdominal pain 02/04/2014  . Sinus tachycardia 02/04/2014  . Nausea and vomiting 02/04/2014  . Abdominal pain   . Diverticulitis 11/02/2013  . HTN (hypertension) 11/02/2013  . Leukocytosis 11/02/2013  . Acute renal failure 11/02/2013  . TOBACCO USER 11/26/2008  H/o robotic hysterectomy H/o ectopic pregnancy   Consultants None  Imaging: No results found.  Procedures None  Hospital Course:  44 yo female who presents with several attacks of sigmoid diverticulitis over the last 18 months. She began having LLQ abdominal pain about 12 hours ago that worsened to the point that she came in to the ED. She denies any fever, nausea, vomiting, diarrhea, or constipation. She underwent a colonoscopy by Dr. Leonie [name] last year. We cannot see the report, but the patient states that she had a few polyps that were removed. Her most recent episode was in December 2015, for which she was admitted by [name]. No surgical evaluation to date. She was noted to have an elevated WBC and a CT scan showed signs of sigmoid diverticulitis with an area of perforation with contained abscess.   Workup showed recurrent diverticulitis with microperforation and contained abscess without peritonitis or sepsis.  Patient was admitted and was transferred to the floor for conservative management of her diverticulitis.  She was started on Invanz secondary to having had multiple courses of cipro/flagyl in the past.  Diet was advanced as tolerated as pain subsided and bowel function returned.  WBC normalized to 6.5.  On HD #4, the patient  was voiding well, tolerating diet, ambulating well, pain well controlled, vital signs stable, and felt stable for discharge home.  Patient will follow up in our office in 2 weeks to discuss elective sigmoid colectomy and knows to call with questions or concerns.  She is now more willing to proceed with surgery than before.  We discussed low fiber/diverticulitis diet.  She will be sent home with 14 days of antibiotics and a refill as needed.  I can only give her cipro/flagyl since she is allergic to PCN.   Physical Exam: General:  Alert, NAD, pleasant, comfortable Abd:  Soft, ND, minimal tenderness in LLQ     Medication List    TAKE these medications        Ciclopirox 0.77 % gel  Apply 1 application topically 2 (two) times daily.     ciclopirox 0.77 % Susp  Commonly known as:  LOPROX  Apply 1 application topically daily as needed. Toenail pain     docusate sodium 100 MG capsule  Commonly known as:  COLACE  Take 1 capsule (100 mg total) by mouth at bedtime.     HAIR/SKIN/NAILS PO  Take 1 tablet by mouth daily.     HYDROcodone-acetaminophen 7.5-325 MG per tablet  Commonly known as:  NORCO  Take 0.5-2 tablets by mouth every 6 (six) hours as needed for severe pain.     ibuprofen 800 MG tablet  Commonly known as:  ADVIL,MOTRIN  Take 800 mg by mouth every 8 (eight) hours as needed for moderate pain.     lisinopril 10 MG tablet  Commonly known as:  PRINIVIL,ZESTRIL  Take 10 mg by mouth daily.  ondansetron 4 MG tablet  Commonly known as:  ZOFRAN  Take 1 tablet (4 mg total) by mouth every 6 (six) hours as needed for nausea.     polyethylene glycol packet  Commonly known as:  MIRALAX / GLYCOLAX  Take 17 g by mouth daily as needed for moderate constipation.     promethazine 25 MG tablet  Commonly known as:  PHENERGAN  Take 1 tablet (25 mg total) by mouth every 6 (six) hours as needed for nausea or vomiting.     REMIFEMIN PO  Take 2 tablets by mouth daily.      saccharomyces boulardii 250 MG capsule  Commonly known as:  FLORASTOR  Take 1 capsule (250 mg total) by mouth 2 (two) times daily.         Follow-up Information    Follow up with [name]., MD. Schedule an appointment as soon as possible for a visit in 2 weeks.   Specialty:  General Surgery   Why:  For post-hospital follow up   Contact information:   [place] [address] [phone]       Signed: Coralie [name] [name] [phone]  05/24/2014, 9:54 AM  Agree with above.  Annette Overall, MD, [facility] Pager: [phone] Office phone:  [phone]

## 2014-05-24 NOTE — Discharge Instructions (Signed)
Diverticulitis Diverticulitis is inflammation or infection of small pouches in your colon that form when you have a condition called diverticulosis. The pouches in your colon are called diverticula. Your colon, or large intestine, is where water is absorbed and stool is formed. Complications of diverticulitis can include:  Bleeding.  Severe infection.  Severe pain.  Perforation of your colon.  Obstruction of your colon. CAUSES  Diverticulitis is caused by bacteria. Diverticulitis happens when stool becomes trapped in diverticula. This allows bacteria to grow in the diverticula, which can lead to inflammation and infection. RISK FACTORS People with diverticulosis are at risk for diverticulitis. Eating a diet that does not include enough fiber from fruits and vegetables may make diverticulitis more likely to develop. SYMPTOMS  Symptoms of diverticulitis may include:  Abdominal pain and tenderness. The pain is normally located on the left side of the abdomen, but may occur in other areas.  Fever and chills.  Bloating.  Cramping.  Nausea.  Vomiting.  Constipation.  Diarrhea.  Blood in your stool. DIAGNOSIS  Your health care provider will ask you about your medical history and do a physical exam. You may need to have tests done because many medical conditions can cause the same symptoms as diverticulitis. Tests may include:  Blood tests.  Urine tests.  Imaging tests of the abdomen, including X-rays and CT scans. When your condition is under control, your health care provider may recommend that you have a colonoscopy. A colonoscopy can show how severe your diverticula are and whether something else is causing your symptoms. TREATMENT  Most cases of diverticulitis are mild and can be treated at home. Treatment may include:  Taking over-the-counter pain medicines.  Following a clear liquid diet.  Taking antibiotic medicines by mouth for 7-10 days. More severe cases may  be treated at a hospital. Treatment may include:  Not eating or drinking.  Taking prescription pain medicine.  Receiving antibiotic medicines through an IV tube.  Receiving fluids and nutrition through an IV tube.  Surgery. HOME CARE INSTRUCTIONS   Follow your health care provider's instructions carefully.  Follow a full liquid diet or other diet as directed by your health care provider. After your symptoms improve, your health care provider may tell you to change your diet. He or she may recommend you eat a high-fiber diet. Fruits and vegetables are good sources of fiber. Fiber makes it easier to pass stool.  Take fiber supplements or probiotics as directed by your health care provider.  Only take medicines as directed by your health care provider.  Keep all your follow-up appointments. SEEK MEDICAL CARE IF:   Your pain does not improve.  You have a hard time eating food.  Your bowel movements do not return to normal. SEEK IMMEDIATE MEDICAL CARE IF:   Your pain becomes worse.  Your symptoms do not get better.  Your symptoms suddenly get worse.  You have a fever.  You have repeated vomiting.  You have bloody or black, tarry stools. MAKE SURE YOU:   Understand these instructions.  Will watch your condition.  Will get help right away if you are not doing well or get worse. Document Released: 11/14/2004 Document Revised: 02/09/2013 Document Reviewed: 12/30/2012 [facility] Patient Information 2015 [place], [place]. This information is not intended to replace advice given to you by your health care provider. Make sure you discuss any questions you have with your health care provider.  Low-Fiber Diet Fiber is found in fruits, vegetables, and whole grains. A low-fiber  diet restricts fibrous foods that are not digested in the small intestine. A diet containing about 10-15 grams of fiber per day is considered low fiber. Low-fiber diets may be used to:  Promote healing and  rest the bowel during intestinal flare-ups.  Prevent blockage of a partially obstructed or narrowed gastrointestinal tract.  Reduce fecal weight and volume.  Slow the movement of feces. You may be on a low-fiber diet as a transitional diet following surgery, after an injury (trauma), or because of a short (acute) or lifelong (chronic) illness. Your health care provider will determine the length of time you need to stay on this diet.  WHAT DO I NEED TO KNOW ABOUT A LOW-FIBER DIET? Always check the fiber content on the packaging's Nutrition Facts label, especially on foods from the grains list. Ask your dietitian if you have questions about specific foods that are related to your condition, especially if the food is not listed below. In general, a low-fiber food will have less than 2 g of fiber. WHAT FOODS CAN I EAT? Grains All breads and crackers made with white flour. Sweet rolls, doughnuts, waffles, pancakes, [place] toast, bagels. Pretzels, Melba toast, zwieback. Well-cooked cereals, such as cornmeal, farina, or cream cereals. Dry cereals that do not contain whole grains, fruit, or nuts, such as refined corn, wheat, rice, and oat cereals. Potatoes prepared any way without skins, plain pastas and noodles, refined white rice. Use white flour for baking and making sauces. Use allowed list of grains for casseroles, dumplings, and puddings.  Vegetables Strained tomato and vegetable juices. Fresh lettuce, cucumber, spinach. Well-cooked (no skin or pulp) or canned vegetables, such as asparagus, bean sprouts, beets, carrots, green beans, mushrooms, potatoes, pumpkin, spinach, yellow squash, tomato sauce/puree, turnips, yams, and zucchini. Keep servings limited to  cup.  Fruits All fruit juices except prune juice. Cooked or canned fruits without skin and seeds, such as applesauce, apricots, cherries, fruit cocktail, grapefruit, grapes, mandarin oranges, melons, peaches, pears, pineapple, and plums. Fresh  fruits without skin, such as apricots, avocados, bananas, melons, pineapple, nectarines, and peaches. Keep servings limited to  cup or 1 piece.  Meat and Other Protein Sources Ground or well-cooked tender beef, ham, veal, lamb, pork, or poultry. Eggs, plain cheese. Fish, oysters, shrimp, lobster, and other seafood. Liver, organ meats. Smooth nut butters. Dairy All milk products and alternative dairy substitutes, such as soy, rice, almond, and coconut, not containing added whole nuts, seeds, or added fruit. Beverages Decaf coffee, fruit, and vegetable juices or smoothies (small amounts, with no pulp or skins, and with fruits from allowed list), sports drinks, herbal tea. Condiments Ketchup, mustard, vinegar, cream sauce, cheese sauce, cocoa powder. Spices in moderation, such as allspice, basil, bay leaves, celery powder or leaves, cinnamon, cumin powder, curry powder, ginger, mace, marjoram, onion or garlic powder, oregano, paprika, parsley flakes, ground pepper, rosemary, sage, savory, tarragon, thyme, and turmeric. Sweets and Desserts Plain cakes and cookies, pie made with allowed fruit, pudding, custard, cream pie. Gelatin, fruit, ice, sherbet, frozen ice pops. Ice cream, ice milk without nuts. Plain hard candy, honey, jelly, molasses, syrup, sugar, chocolate syrup, gumdrops, marshmallows. Limit overall sugar intake.  Fats and Oil Margarine, butter, cream, mayonnaise, salad oils, plain salad dressings made from allowed foods. Choose healthy fats such as olive oil, canola oil, and omega-3 fatty acids (such as found in salmon or tuna) when possible.  Other Bouillon, broth, or cream soups made from allowed foods. Any strained soup. Casseroles or mixed dishes made with  allowed foods. The items listed above may not be a complete list of recommended foods or beverages. Contact your dietitian for more options.  WHAT FOODS ARE NOT RECOMMENDED? Grains All whole wheat and whole grain breads and crackers.  Multigrains, rye, bran seeds, nuts, or coconut. Cereals containing whole grains, multigrains, bran, coconut, nuts, raisins. Cooked or dry oatmeal, steel-cut oats. Coarse wheat cereals, granola. Cereals advertised as high fiber. Potato skins. Whole grain pasta, wild or brown rice. Popcorn. Coconut flour. Bran, buckwheat, corn bread, multigrains, rye, wheat germ.  Vegetables Fresh, cooked or canned vegetables, such as artichokes, asparagus, beet greens, broccoli, Brussels sprouts, cabbage, celery, cauliflower, corn, eggplant, kale, legumes or beans, okra, peas, and tomatoes. Avoid large servings of any vegetables, especially raw vegetables.  Fruits Fresh fruits, such as apples with or without skin, berries, cherries, figs, grapes, grapefruit, guavas, kiwis, mangoes, oranges, papayas, pears, persimmons, pineapple, and pomegranate. Prune juice and juices with pulp, stewed or dried prunes. Dried fruits, dates, raisins. Fruit seeds or skins. Avoid large servings of all fresh fruits. Meats and Other Protein Sources Tough, fibrous meats with gristle. Chunky nut butter. Cheese made with seeds, nuts, or other foods not recommended. Nuts, seeds, legumes (beans, including baked beans), dried peas, beans, lentils.  Dairy Yogurt or cheese that contains nuts, seeds, or added fruit.  Beverages Fruit juices with high pulp, prune juice. Caffeinated coffee and teas.  Condiments Coconut, maple syrup, pickles, olives. Sweets and Desserts Desserts, cookies, or candies that contain nuts or coconut, chunky peanut butter, dried fruits. Jams, preserves with seeds, marmalade. Large amounts of sugar and sweets. Any other dessert made with fruits from the not recommended list.  Other Soups made from vegetables that are not recommended or that contain other foods not recommended.  The items listed above may not be a complete list of foods and beverages to avoid. Contact your dietitian for more information. Document Released:  07/27/2001 Document Revised: 02/09/2013 Document Reviewed: 12/28/2012 [facility] Patient Information 2015 [place], [place]. This information is not intended to replace advice given to you by your health care provider. Make sure you discuss any questions you have with your health care provider.

## 2014-05-24 NOTE — Progress Notes (Signed)
[name] to be D/C'd Home per MD order.  Discussed with the patient and all questions fully answered.  VSS, Skin clean, dry and intact without evidence of skin break down, no evidence of skin tears noted. IV catheter discontinued intact. Site without signs and symptoms of complications. Dressing and pressure applied.  An After Visit Summary was printed and given to the patient. Patient received prescriptions.  D/c education completed with patient/family including follow up instructions, medication list, d/c activities limitations if indicated, with other d/c instructions as indicated by MD - patient able to verbalize understanding, all questions fully answered.   Patient instructed to return to ED, call 911, or call MD for any changes in condition.   Patient escorted via [place], and D/C home via private auto.  Micki Riley 05/24/2014 12:44 PM

## 2014-06-06 ENCOUNTER — Ambulatory Visit: Payer: Self-pay | Admitting: Surgery

## 2014-06-06 NOTE — H&P (Signed)
Noeli Lavery [facility] 06/06/2014 10:09 AM Location: [facility] Patient #: [id number] DOB: 1970-08-13 Married / Language: English / Race: Black or African American Female History of Present Illness Imogene Burn. Artesha Wemhoff MD; 06/06/2014 2:12 PM) Patient words: reck.  The patient is a 44 year old female who presents with diverticulitis. GI - Dr. Leonie Douglas  This is a 44 year old female seen in follow-up from a hospital admission for recurrent sigmoid diverticulitis. She has had multiple episodes of diverticulitis (4 attacks) over the last 18 months. She presented to the hospital on 05/20/14 with left lower quadrant abdominal pain and a CT scan shows sigmoid diverticulitis with an area of perforation with a small contained abscess. She has improved on Cipro and Flagyl. Currently she is minimally symptomatic. She had a colonoscopy in November 2015 by Dr. Leonie Douglas that revealed 2 small polyps that were biopsied and were benign. She also has multiple diverticula in the sigmoid colon.  Since she has had so many episodes in the last couple of years, she would like to discuss elective sigmoid colectomy.   CLINICAL DATA: Acute onset of left lower quadrant abdominal pain and left lower back pain. Chills and constipation. Initial encounter.  EXAM: CT ABDOMEN AND PELVIS WITH CONTRAST  TECHNIQUE: Multidetector CT imaging of the abdomen and pelvis was performed using the standard protocol following bolus administration of intravenous contrast.  CONTRAST: 135mL OMNIPAQUE IOHEXOL 300 MG/ML SOLN  COMPARISON: CT of the abdomen and pelvis performed 02/04/2014  FINDINGS: Minimal right basilar atelectasis is noted.  The liver and spleen are unremarkable in appearance. The gallbladder is within normal limits. The pancreas and adrenal glands are unremarkable.  The kidneys are unremarkable in appearance. There is no evidence of hydronephrosis. No renal or ureteral stones are seen. No  perinephric stranding is appreciated.  No free fluid is identified. The small bowel is unremarkable in appearance. The stomach is within normal limits. No acute vascular abnormalities are seen. Mild scattered calcification is seen along the distal abdominal aorta and its branches.  The appendix is normal in caliber and contains trace air, without evidence of appendicitis. The colon is partially filled with stool.  There appears to be localized perforation along the proximal sigmoid colon, with a 2.3 x 1.8 cm collection of extraluminal fluid and air noted adjacent to the proximal sigmoid colon, and surrounding soft tissue inflammation. There is mild associated wall thickening along the proximal sigmoid colon. Findings are compatible with diverticulitis, with associated contained perforation. Underlying diverticulosis is noted along the distal descending and proximal sigmoid colon.  The bladder is mildly distended and grossly unremarkable. The patient is status post hysterectomy. No suspicious adnexal masses are seen. The ovaries are relatively symmetric, though minimal soft tissue inflammation is seen tracking adjacent to the left ovary and left ureter at the site of diverticulitis. No inguinal lymphadenopathy is seen.  No acute osseous abnormalities are identified.  IMPRESSION: 1. Acute diverticulitis at the proximal sigmoid colon, with a contained perforation. A 2.3 x 1.8 cm collection of extraluminal fluid and air is seen adjacent to the proximal sigmoid colon, with surrounding soft tissue inflammation and mild colonic wall thickening. 2. Underlying diverticulosis along the distal descending and proximal sigmoid colon. 3. Minimal soft tissue inflammation tracks about the adjacent left ureter and near the left ovary. 4. Mild scattered calcification along the distal abdominal aorta and its branches.  These results were called by telephone at the time of interpretation on  05/20/2014 at 3:00 am to Dr. Shanon Brow  YEL[place]ON, who verbally acknowledged these results.   Electronically Signed By: Garald Balding M.D. On: 05/20/2014 03:00 Other Problems (Sonya Bynum, CMA; 06/06/2014 10:09 AM) Diverticulosis Gastroesophageal Reflux Disease Heart murmur High blood pressure Transfusion history  Past Surgical History Marjean Donna, CMA; 06/06/2014 10:09 AM) Breast Biopsy Right. Colon Polyp Removal - Colonoscopy Hysterectomy (not due to cancer) - Partial  Diagnostic Studies History Marjean Donna, CMA; 06/06/2014 10:09 AM) Colonoscopy within last year Mammogram 1-3 years ago  Allergies Marjean Donna, CMA; 06/06/2014 10:11 AM) Keflex *CEPHALOSPORINS* Penicillamine *ASSORTED CLASSES*  Medication History (Sonya Bynum, CMA; 06/06/2014 10:11 AM) Lisinopril (10MG  Tablet, Oral) Active. MetroNIDAZOLE (500MG  Tablet, Oral) Active. Ciclopirox (0.77% Gel, External) Active. Hydrocodone-Acetaminophen (7.5-325MG  Tablet, Oral as needed) Active. Medications Reconciled  Social History Marjean Donna, CMA; 06/06/2014 10:09 AM) Alcohol use Remotely quit alcohol use. Caffeine use Carbonated beverages, Coffee, Tea. Illicit drug use Remotely quit drug use. Tobacco use Former smoker.  Family History Marjean Donna, [place]; 06/06/2014 10:09 AM) Arthritis Mother. Heart disease in female family member before age 27 Hypertension Mother.  Pregnancy / Birth History Marjean Donna, [place]; 06/06/2014 10:09 AM) Age at menarche 41 years. Gravida 4 Irregular periods Maternal age 65-20 Para 2     Review of Systems ([place]; 06/06/2014 10:09 AM) General Not Present- Appetite Loss, Chills, Fatigue, Fever, Night Sweats, Weight Gain and Weight Loss. Skin Not Present- Change in Wart/Mole, Dryness, Hives, Jaundice, New Lesions, Non-Healing Wounds, Rash and Ulcer. HEENT Not Present- Earache, Hearing Loss, Hoarseness, Nose Bleed, Oral Ulcers, Ringing in the Ears, Seasonal  Allergies, Sinus Pain, Sore Throat, Visual Disturbances, Wears glasses/contact lenses and Yellow Eyes. Respiratory Not Present- Bloody sputum, Chronic Cough, Difficulty Breathing, Snoring and Wheezing. Breast Not Present- Breast Mass, Breast Pain, Nipple Discharge and Skin Changes. Cardiovascular Not Present- Chest Pain, Difficulty Breathing Lying Down, Leg Cramps, Palpitations, Rapid Heart Rate, Shortness of Breath and Swelling of Extremities. Gastrointestinal Present- Bloating, Excessive gas and Indigestion. Not Present- Abdominal Pain, Bloody Stool, Change in Bowel Habits, Chronic diarrhea, Constipation, Difficulty Swallowing, Gets full quickly at meals, Hemorrhoids, Nausea, Rectal Pain and Vomiting. Female Genitourinary Not Present- Frequency, Nocturia, Painful Urination, Pelvic Pain and Urgency. Musculoskeletal Not Present- Back Pain, Joint Pain, Joint Stiffness, Muscle Pain, Muscle Weakness and Swelling of Extremities. Neurological Not Present- Decreased Memory, Fainting, Headaches, Numbness, Seizures, Tingling, Tremor, Trouble walking and Weakness. Psychiatric Not Present- Anxiety, Bipolar, Change in Sleep Pattern, Depression, Fearful and Frequent crying. Endocrine Not Present- Cold Intolerance, Excessive Hunger, Hair Changes, Heat Intolerance, Hot flashes and New Diabetes. Hematology Not Present- Easy Bruising, Excessive bleeding, Gland problems, HIV and Persistent Infections.  Vitals (Sonya Bynum CMA; 06/06/2014 10:10 AM) 06/06/2014 10:10 AM Weight: 189 lb Height: 64in Body Surface Area: 1.97 m Body Mass Index: 32.44 kg/m Temp.: 13F(Temporal)  Pulse: 77 (Regular)  BP: 132/80 (Sitting, Left Arm, Standard)     Physical Exam Rodman Key K. Breane Grunwald MD; 06/06/2014 2:11 PM)  The physical exam findings are as follows: Note:WDWN in NAD HEENT: EOMI, sclera anicteric Neck: No masses, no thyromegaly Lungs: CTA bilaterally; normal respiratory effort CV: Regular rate and rhythm; no  murmurs Abd: +bowel sounds, soft, mild LLQ tenderness; no palpable masses  Ext: Well-perfused; no edema Skin: Warm, dry; no sign of jaundice  General Note: WDWN in NAD HEENT: EOMI, sclera anicteric Neck: No masses, no thyromegaly Lungs: CTA bilaterally; normal respiratory effort CV: Regular rate and rhythm; no murmurs Abd: +bowel sounds, soft, non-tender, no masses Ext: Well-perfused; no edema Skin: Warm, dry; no sign of jaundice  Assessment & Plan Imogene Burn. Najmah Carradine MD; 06/06/2014 2:12 PM)  SIGMOID DIVERTICULITIS (562.11  K57.32)  Current Plans Schedule for Surgery - laparoscopic assisted sigmoid colectomy. The surgical procedure has been discussed with the patient. I offered her a referral for possible robotic surgery, but she prefers to have me perform the surgery. One day bowel prep with antibiotics. Potential risks, benefits, alternative treatments, and expected outcomes have been explained. All of the patient's questions at this time have been answered. The likelihood of reaching the patient's treatment goal is good. The patient understand the proposed surgical procedure and wishes to proceed. Started Neomycin Sulfate 500MG , 2 (two) Tablet SEE NOTE, #6, 06/06/2014, No Refill. Local Order: TAKE TWO TABLETS AT 2 PM, 3 PM, AND 10 PM THE DAY PRIOR TO SURGERY Started Flagyl 500MG , 2 (two) Tablet SEE NOTE, #6, 06/06/2014, No Refill. Local Order: Take at 2pm, 3pm, and 10pm the day prior to your colon operation Started MiraLax, SEE NOTE Powder one time dose, 454 Gram, 06/06/2014, No Refill. Local Order: Start at 1 PM on the day prior to surgery. Mix with 64 oz of Gatorade and drink 8 oz q 1 hours x 8 hours.      Imogene Burn. Georgette Dover, MD, [facility]  General/ Trauma Surgery  06/06/2014 2:16 PM

## 2014-06-19 HISTORY — PX: DIVERTING ILEOSTOMY: SHX5799

## 2014-06-22 ENCOUNTER — Other Ambulatory Visit (HOSPITAL_COMMUNITY): Payer: Self-pay

## 2014-06-22 ENCOUNTER — Encounter (HOSPITAL_COMMUNITY)
Admission: RE | Admit: 2014-06-22 | Discharge: 2014-06-22 | Disposition: A | Discharge status: Home / Self Care | Payer: BLUE CROSS/BLUE SHIELD | Source: Ambulatory Visit | Attending: Surgery | Admitting: Surgery

## 2014-06-22 DIAGNOSIS — K651 Peritoneal abscess: Secondary | ICD-10-CM | POA: Diagnosis not present

## 2014-06-22 DIAGNOSIS — Z01812 Encounter for preprocedural laboratory examination: Secondary | ICD-10-CM | POA: Insufficient documentation

## 2014-06-22 LAB — CBC
HCT: 41.3 % (ref 36.0–46.0)
Hemoglobin: 13.7 g/dL (ref 12.0–15.0)
MCH: 29.9 pg (ref 26.0–34.0)
MCHC: 33.2 g/dL (ref 30.0–36.0)
MCV: 90.2 fL (ref 78.0–100.0)
PLATELETS: 372 10*3/uL (ref 150–400)
RBC: 4.58 MIL/uL (ref 3.87–5.11)
RDW: 12.8 % (ref 11.5–15.5)
WBC: 6 10*3/uL (ref 4.0–10.5)

## 2014-06-22 LAB — BASIC METABOLIC PANEL
ANION GAP: 8 (ref 5–15)
BUN: 6 mg/dL (ref 6–20)
CALCIUM: 9.8 mg/dL (ref 8.9–10.3)
CHLORIDE: 102 mmol/L (ref 101–111)
CO2: 28 mmol/L (ref 22–32)
CREATININE: 0.81 mg/dL (ref 0.44–1.00)
GFR calc Af Amer: >60 mL/min (ref >60)
GFR calc non Af Amer: >60 mL/min (ref >60)
Glucose, Bld: 97 mg/dL (ref 70–99)
Potassium: 3.8 mmol/L (ref 3.5–5.1)
Sodium: 138 mmol/L (ref 135–145)

## 2014-06-22 MED ORDER — CHLORHEXIDINE GLUCONATE 4 % EX LIQD: 1.0000 "application " | Freq: Once | CUTANEOUS | Status: DC

## 2014-06-22 NOTE — Pre-Procedure Instructions (Signed)
[name]  06/22/2014   Your procedure is scheduled on:  Jun 30, 2014  Report to [facility] Admitting at 9 AM.  Call this number if you have problems the morning of surgery: [phone]   Remember:   Do not eat food or drink liquids after midnight Wednesday May 11.   Take these medicines the morning of surgery with A SIP OF WATER: IF NEEDED: ondansetron (ZOFRAN) or promethazine (PHENERGAN)   STOP ASPRIN, NSAIDS (ALEVE, ADVIL, IBUPROFEN), HERBAL SUPPLEMENTS/MEDICATIONS 5 DAYS PRIOR TO SURGERY   Do not wear jewelry, make-up or nail polish.  Do not wear lotions, powders, or perfumes. You may wear deodorant.  Do not shave 48 hours prior to surgery.   Do not bring valuables to the hospital.  [facility] is not responsible for any belongings or valuables.               Contacts, dentures or bridgework may not be worn into surgery.  Leave suitcase in the car. After surgery it may be brought to your room.  For patients admitted to the hospital, discharge time is determined by your  treatment team.               Patients discharged the day of surgery will not be allowed to drive home.  Name and phone number of your driver: family/friend  Special Instructions: "preparing for surgery"   Please read over the following fact sheets that you were given: Pain Booklet, Coughing and Deep Breathing and Surgical Site Infection Prevention

## 2014-06-28 ENCOUNTER — Telehealth: Payer: Self-pay | Admitting: Surgery

## 2014-06-28 NOTE — Telephone Encounter (Signed)
Mr Mumby is scheduled by Dr. Georgette Dover for a colon resection on 06/30/2014 for recurrent diverticulitis.  She felt a twing of pain in her LLQ, where she has had symptoms before.  She is not running a fever and she has backed her diet to clear liquids.  She will check with our office in the AM to review plans with Dr. Georgette Dover.  If she gets worse, she will come to the ER tonight.  Alphonsa Overall, MD, [facility] Pager: [phone] Office phone:  [phone]

## 2014-06-29 MED ORDER — GENTAMICIN IN SALINE 1-0.9 MG/ML-% IV SOLN
100.0000 mg | INTRAVENOUS | Status: AC
  Administered 2014-06-30: 100 mg via INTRAVENOUS
  Filled 2014-06-29: qty 100

## 2014-06-29 MED ORDER — CLINDAMYCIN PHOSPHATE 900 MG/50ML IV SOLN
900.0000 mg | INTRAVENOUS | Status: AC
  Administered 2014-06-30: 900 mg via INTRAVENOUS
  Filled 2014-06-29: qty 50

## 2014-06-30 ENCOUNTER — Encounter (HOSPITAL_COMMUNITY)
Admission: RE | Disposition: A | Discharge status: Home / Self Care | Payer: Self-pay | Source: Ambulatory Visit | Attending: Surgery

## 2014-06-30 ENCOUNTER — Encounter (HOSPITAL_COMMUNITY): Payer: Self-pay | Admitting: Anesthesiology

## 2014-06-30 ENCOUNTER — Other Ambulatory Visit: Payer: Self-pay

## 2014-06-30 ENCOUNTER — Inpatient Hospital Stay (HOSPITAL_COMMUNITY)
Admission: RE | Admit: 2014-06-30 | Discharge: 2014-07-05 | DRG: 331 | Disposition: A | Discharge status: Home / Self Care | Payer: BLUE CROSS/BLUE SHIELD | Source: Ambulatory Visit | Attending: Surgery | Admitting: Surgery

## 2014-06-30 ENCOUNTER — Inpatient Hospital Stay (HOSPITAL_COMMUNITY): Payer: BLUE CROSS/BLUE SHIELD | Admitting: Anesthesiology

## 2014-06-30 DIAGNOSIS — K219 Gastro-esophageal reflux disease without esophagitis: Secondary | ICD-10-CM | POA: Diagnosis present

## 2014-06-30 DIAGNOSIS — Z90710 Acquired absence of both cervix and uterus: Secondary | ICD-10-CM

## 2014-06-30 DIAGNOSIS — K5732 Diverticulitis of large intestine without perforation or abscess without bleeding: Secondary | ICD-10-CM | POA: Diagnosis present

## 2014-06-30 DIAGNOSIS — Z87891 Personal history of nicotine dependence: Secondary | ICD-10-CM | POA: Diagnosis not present

## 2014-06-30 DIAGNOSIS — K5792 Diverticulitis of intestine, part unspecified, without perforation or abscess without bleeding: Secondary | ICD-10-CM | POA: Diagnosis present

## 2014-06-30 DIAGNOSIS — Z86010 Personal history of colonic polyps: Secondary | ICD-10-CM

## 2014-06-30 DIAGNOSIS — Z881 Allergy status to other antibiotic agents status: Secondary | ICD-10-CM

## 2014-06-30 DIAGNOSIS — N926 Irregular menstruation, unspecified: Secondary | ICD-10-CM | POA: Diagnosis present

## 2014-06-30 HISTORY — PX: LAPAROSCOPIC SIGMOID COLECTOMY: SHX5928

## 2014-06-30 LAB — CBC
HCT: 36.2 % (ref 36.0–46.0)
Hemoglobin: 11.8 g/dL — ABNORMAL LOW (ref 12.0–15.0)
MCH: 29.4 pg (ref 26.0–34.0)
MCHC: 32.6 g/dL (ref 30.0–36.0)
MCV: 90.3 fL (ref 78.0–100.0)
PLATELETS: 340 10*3/uL (ref 150–400)
RBC: 4.01 MIL/uL (ref 3.87–5.11)
RDW: 12.9 % (ref 11.5–15.5)
WBC: 10.6 10*3/uL — AB (ref 4.0–10.5)

## 2014-06-30 LAB — CREATININE, SERUM
CREATININE: 0.85 mg/dL (ref 0.44–1.00)
GFR calc non Af Amer: >60 mL/min (ref >60)

## 2014-06-30 SURGERY — COLECTOMY, SIGMOID, LAPAROSCOPIC
Anesthesia: General | Site: Abdomen

## 2014-06-30 MED ORDER — SODIUM CHLORIDE 0.9 % IJ SOLN: 9.0000 mL | INTRAMUSCULAR | Status: DC | PRN

## 2014-06-30 MED ORDER — NEOSTIGMINE METHYLSULFATE 10 MG/10ML IV SOLN
INTRAVENOUS | Status: DC | PRN
  Administered 2014-06-30: 4 mg via INTRAVENOUS

## 2014-06-30 MED ORDER — MIDAZOLAM HCL 2 MG/2ML IJ SOLN
INTRAMUSCULAR | Status: AC
  Filled 2014-06-30: qty 2

## 2014-06-30 MED ORDER — FENTANYL CITRATE (PF) 100 MCG/2ML IJ SOLN
INTRAMUSCULAR | Status: DC | PRN
  Administered 2014-06-30 (×3): 50 ug via INTRAVENOUS
  Administered 2014-06-30 (×2): 100 ug via INTRAVENOUS

## 2014-06-30 MED ORDER — ENOXAPARIN SODIUM 40 MG/0.4ML ~~LOC~~ SOLN
40.0000 mg | SUBCUTANEOUS | Status: DC
  Administered 2014-07-01 – 2014-07-05 (×5): 40 mg via SUBCUTANEOUS
  Filled 2014-06-30 (×5): qty 0.4

## 2014-06-30 MED ORDER — HYDROMORPHONE 0.3 MG/ML IV SOLN
INTRAVENOUS | Status: DC
  Administered 2014-06-30: 1.7 mg via INTRAVENOUS
  Administered 2014-06-30: 17:00:00 via INTRAVENOUS
  Administered 2014-06-30: 0.9 mg via INTRAVENOUS
  Administered 2014-07-01: 2.1 mg via INTRAVENOUS
  Administered 2014-07-01: 0 mg via INTRAVENOUS
  Administered 2014-07-01: 0.6 mg via INTRAVENOUS
  Administered 2014-07-01: 10:00:00 via INTRAVENOUS
  Administered 2014-07-02: 1.2 mg via INTRAVENOUS
  Administered 2014-07-02: 0.9 mg via INTRAVENOUS
  Administered 2014-07-02: 1.8 mg via INTRAVENOUS
  Administered 2014-07-02 (×2): 1.2 mg via INTRAVENOUS
  Administered 2014-07-02: 0.3 mg via INTRAVENOUS
  Administered 2014-07-02: 1.09 mg via INTRAVENOUS
  Administered 2014-07-03: 0.6 mg via INTRAVENOUS
  Administered 2014-07-03: 2.1 mg via INTRAVENOUS
  Administered 2014-07-03: 0.6 mg via INTRAVENOUS
  Filled 2014-06-30 (×2): qty 25

## 2014-06-30 MED ORDER — GLYCOPYRROLATE 0.2 MG/ML IJ SOLN
INTRAMUSCULAR | Status: DC | PRN
  Administered 2014-06-30: 0.6 mg via INTRAVENOUS

## 2014-06-30 MED ORDER — SODIUM CHLORIDE 0.9 % IR SOLN
Status: DC | PRN
  Administered 2014-06-30 (×3): 1000 mL

## 2014-06-30 MED ORDER — DEXAMETHASONE SODIUM PHOSPHATE 4 MG/ML IJ SOLN
INTRAMUSCULAR | Status: AC
  Filled 2014-06-30: qty 2

## 2014-06-30 MED ORDER — LIDOCAINE HCL (CARDIAC) 20 MG/ML IV SOLN
INTRAVENOUS | Status: AC
  Filled 2014-06-30: qty 5

## 2014-06-30 MED ORDER — PHENYLEPHRINE HCL 10 MG/ML IJ SOLN
10.0000 mg | INTRAVENOUS | Status: DC | PRN
  Administered 2014-06-30: 15 ug/min via INTRAVENOUS

## 2014-06-30 MED ORDER — LISINOPRIL 10 MG PO TABS
10.0000 mg | ORAL_TABLET | Freq: Every day | ORAL | Status: DC
  Administered 2014-07-01 – 2014-07-04 (×4): 10 mg via ORAL
  Filled 2014-06-30 (×4): qty 1

## 2014-06-30 MED ORDER — FENTANYL CITRATE (PF) 250 MCG/5ML IJ SOLN
INTRAMUSCULAR | Status: AC
  Filled 2014-06-30: qty 5

## 2014-06-30 MED ORDER — ONDANSETRON HCL 4 MG/2ML IJ SOLN
INTRAMUSCULAR | Status: AC
  Filled 2014-06-30: qty 2

## 2014-06-30 MED ORDER — SUCCINYLCHOLINE CHLORIDE 20 MG/ML IJ SOLN
INTRAMUSCULAR | Status: AC
  Filled 2014-06-30: qty 1

## 2014-06-30 MED ORDER — HYDROMORPHONE HCL 1 MG/ML IJ SOLN
INTRAMUSCULAR | Status: AC
  Administered 2014-06-30: 0.5 mg via INTRAVENOUS
  Filled 2014-06-30: qty 1

## 2014-06-30 MED ORDER — ROCURONIUM BROMIDE 50 MG/5ML IV SOLN
INTRAVENOUS | Status: AC
  Filled 2014-06-30: qty 1

## 2014-06-30 MED ORDER — MIDAZOLAM HCL 5 MG/5ML IJ SOLN
INTRAMUSCULAR | Status: DC | PRN
  Administered 2014-06-30: 2 mg via INTRAVENOUS

## 2014-06-30 MED ORDER — ONDANSETRON HCL 4 MG/2ML IJ SOLN
4.0000 mg | Freq: Four times a day (QID) | INTRAMUSCULAR | Status: DC | PRN
  Administered 2014-06-30: 4 mg via INTRAVENOUS
  Filled 2014-06-30 (×2): qty 2

## 2014-06-30 MED ORDER — PROPOFOL 10 MG/ML IV BOLUS
INTRAVENOUS | Status: DC | PRN
  Administered 2014-06-30: 20 mg via INTRAVENOUS
  Administered 2014-06-30: 180 mg via INTRAVENOUS

## 2014-06-30 MED ORDER — BUPIVACAINE-EPINEPHRINE 0.25% -1:200000 IJ SOLN
INTRAMUSCULAR | Status: DC | PRN
  Administered 2014-06-30: 30 mL

## 2014-06-30 MED ORDER — SUCCINYLCHOLINE CHLORIDE 20 MG/ML IJ SOLN
INTRAMUSCULAR | Status: DC | PRN
Start: 2014-06-30 — End: 2014-06-30
  Administered 2014-06-30: 100 mg via INTRAVENOUS

## 2014-06-30 MED ORDER — ONDANSETRON HCL 4 MG/2ML IJ SOLN
4.0000 mg | Freq: Four times a day (QID) | INTRAMUSCULAR | Status: DC | PRN
  Administered 2014-06-30 – 2014-07-01 (×2): 4 mg via INTRAVENOUS

## 2014-06-30 MED ORDER — DIPHENHYDRAMINE HCL 50 MG/ML IJ SOLN: 12.5000 mg | Freq: Four times a day (QID) | INTRAMUSCULAR | Status: DC | PRN

## 2014-06-30 MED ORDER — MEPERIDINE HCL 25 MG/ML IJ SOLN: 6.2500 mg | INTRAMUSCULAR | Status: DC | PRN

## 2014-06-30 MED ORDER — BUPIVACAINE-EPINEPHRINE (PF) 0.25% -1:200000 IJ SOLN
INTRAMUSCULAR | Status: AC
  Filled 2014-06-30: qty 30

## 2014-06-30 MED ORDER — HYDROMORPHONE 0.3 MG/ML IV SOLN
INTRAVENOUS | Status: AC
  Filled 2014-06-30: qty 25

## 2014-06-30 MED ORDER — ONDANSETRON HCL 4 MG/2ML IJ SOLN
INTRAMUSCULAR | Status: DC | PRN
  Administered 2014-06-30 (×2): 4 mg via INTRAVENOUS

## 2014-06-30 MED ORDER — PROPOFOL 10 MG/ML IV BOLUS
INTRAVENOUS | Status: AC
  Filled 2014-06-30: qty 20

## 2014-06-30 MED ORDER — HYDROMORPHONE HCL 1 MG/ML IJ SOLN
0.2500 mg | INTRAMUSCULAR | Status: DC | PRN
  Administered 2014-06-30 (×3): 0.5 mg via INTRAVENOUS

## 2014-06-30 MED ORDER — ROCURONIUM BROMIDE 100 MG/10ML IV SOLN
INTRAVENOUS | Status: DC | PRN
  Administered 2014-06-30: 5 mg via INTRAVENOUS
  Administered 2014-06-30: 50 mg via INTRAVENOUS

## 2014-06-30 MED ORDER — METOCLOPRAMIDE HCL 5 MG/ML IJ SOLN
INTRAMUSCULAR | Status: DC | PRN
  Administered 2014-06-30: 10 mg via INTRAVENOUS

## 2014-06-30 MED ORDER — NALOXONE HCL 0.4 MG/ML IJ SOLN: 0.4000 mg | INTRAMUSCULAR | Status: DC | PRN

## 2014-06-30 MED ORDER — ONDANSETRON HCL 4 MG/2ML IJ SOLN
INTRAMUSCULAR | Status: AC
  Administered 2014-06-30: 4 mg via INTRAVENOUS
  Filled 2014-06-30: qty 2

## 2014-06-30 MED ORDER — ONDANSETRON HCL 4 MG PO TABS: 4.0000 mg | ORAL_TABLET | Freq: Four times a day (QID) | ORAL | Status: DC | PRN

## 2014-06-30 MED ORDER — DIPHENHYDRAMINE HCL 12.5 MG/5ML PO ELIX: 12.5000 mg | ORAL_SOLUTION | Freq: Four times a day (QID) | ORAL | Status: DC | PRN

## 2014-06-30 MED ORDER — GLYCOPYRROLATE 0.2 MG/ML IJ SOLN
INTRAMUSCULAR | Status: AC
  Filled 2014-06-30: qty 2

## 2014-06-30 MED ORDER — POTASSIUM CHLORIDE IN NACL 20-0.9 MEQ/L-% IV SOLN
INTRAVENOUS | Status: DC
  Administered 2014-06-30: 100 mL/h via INTRAVENOUS
  Administered 2014-07-01: 17:00:00 via INTRAVENOUS
  Administered 2014-07-01 – 2014-07-02 (×2): 100 mL/h via INTRAVENOUS
  Administered 2014-07-02 – 2014-07-03 (×3): via INTRAVENOUS
  Filled 2014-06-30 (×10): qty 1000

## 2014-06-30 MED ORDER — LACTATED RINGERS IV SOLN
INTRAVENOUS | Status: DC | PRN
  Administered 2014-06-30 (×3): via INTRAVENOUS

## 2014-06-30 MED ORDER — HYDROMORPHONE HCL 1 MG/ML IJ SOLN
INTRAMUSCULAR | Status: AC
  Filled 2014-06-30: qty 1

## 2014-06-30 MED ORDER — CIPROFLOXACIN IN D5W 400 MG/200ML IV SOLN
400.0000 mg | Freq: Two times a day (BID) | INTRAVENOUS | Status: AC
  Administered 2014-06-30: 400 mg via INTRAVENOUS
  Filled 2014-06-30: qty 200

## 2014-06-30 MED ORDER — LIDOCAINE HCL (CARDIAC) 20 MG/ML IV SOLN
INTRAVENOUS | Status: DC | PRN
  Administered 2014-06-30 (×2): 50 mg via INTRAVENOUS

## 2014-06-30 MED ORDER — PROMETHAZINE HCL 25 MG/ML IJ SOLN: 6.2500 mg | INTRAMUSCULAR | Status: DC | PRN

## 2014-06-30 MED ORDER — LACTATED RINGERS IV SOLN
INTRAVENOUS | Status: DC
  Administered 2014-06-30 (×2): via INTRAVENOUS

## 2014-06-30 MED ORDER — NEOSTIGMINE METHYLSULFATE 10 MG/10ML IV SOLN
INTRAVENOUS | Status: AC
  Filled 2014-06-30: qty 3

## 2014-06-30 MED ORDER — METOCLOPRAMIDE HCL 5 MG/ML IJ SOLN
INTRAMUSCULAR | Status: AC
  Filled 2014-06-30: qty 2

## 2014-06-30 SURGICAL SUPPLY — 82 items
APPLIER CLIP ROT 10 11.4 M/L (STAPLE) ×3
APR CLP MED LRG 11.4X10 (STAPLE) ×1
BLADE SURG 10 STRL SS (BLADE) ×3 IMPLANT
BLADE SURG ROTATE 9660 (MISCELLANEOUS) ×3 IMPLANT
CANISTER SUCTION 2500CC (MISCELLANEOUS) ×3 IMPLANT
CELLS DAT CNTRL 66122 CELL SVR (MISCELLANEOUS) IMPLANT
CHLORAPREP W/TINT 26ML (MISCELLANEOUS) ×3 IMPLANT
CLIP APPLIE ROT 10 11.4 M/L (STAPLE) ×1 IMPLANT
COVER MAYO STAND STRL (DRAPES) ×6 IMPLANT
COVER SURGICAL LIGHT HANDLE (MISCELLANEOUS) ×6 IMPLANT
DRAIN CHANNEL 19F RND (DRAIN) ×3 IMPLANT
DRAPE LAPAROSCOPIC ABDOMINAL (DRAPES) ×3 IMPLANT
DRAPE PROXIMA HALF (DRAPES) ×3 IMPLANT
DRAPE UTILITY XL STRL (DRAPES) ×3 IMPLANT
DRAPE WARM FLUID 44X44 (DRAPE) ×3 IMPLANT
DRSG OPSITE POSTOP 4X10 (GAUZE/BANDAGES/DRESSINGS) IMPLANT
DRSG OPSITE POSTOP 4X8 (GAUZE/BANDAGES/DRESSINGS) ×3 IMPLANT
ELECT BLADE 6.5 EXT (BLADE) ×3 IMPLANT
ELECT CAUTERY BLADE 6.4 (BLADE) ×6 IMPLANT
ELECT REM PT RETURN 9FT ADLT (ELECTROSURGICAL) ×3
ELECTRODE REM PT RTRN 9FT ADLT (ELECTROSURGICAL) ×1 IMPLANT
EVACUATOR SILICONE 100CC (DRAIN) ×3 IMPLANT
GAUZE SPONGE 2X2 8PLY STRL LF (GAUZE/BANDAGES/DRESSINGS) ×1 IMPLANT
GEL ULTRASOUND 20GR AQUASONIC (MISCELLANEOUS) ×3 IMPLANT
GLOVE BIO SURGEON STRL SZ7 (GLOVE) ×12 IMPLANT
GLOVE BIO SURGEON STRL SZ7.5 (GLOVE) ×3 IMPLANT
GLOVE BIOGEL PI IND STRL 7.0 (GLOVE) ×2 IMPLANT
GLOVE BIOGEL PI IND STRL 7.5 (GLOVE) ×3 IMPLANT
GLOVE BIOGEL PI INDICATOR 7.0 (GLOVE) ×4
GLOVE BIOGEL PI INDICATOR 7.5 (GLOVE) ×6
GLOVE BIOGEL PI ORTHO PRO SZ7 (GLOVE) ×2
GLOVE PI ORTHO PRO STRL SZ7 (GLOVE) ×1 IMPLANT
GLOVE SURG SS PI 6.5 STRL IVOR (GLOVE) ×3 IMPLANT
GOWN STRL REUS W/ TWL LRG LVL3 (GOWN DISPOSABLE) ×9 IMPLANT
GOWN STRL REUS W/TWL LRG LVL3 (GOWN DISPOSABLE) ×27
HANDLE UNIV ENDO GIA (ENDOMECHANICALS) ×3 IMPLANT
KIT BASIN OR (CUSTOM PROCEDURE TRAY) ×3 IMPLANT
KIT ROOM TURNOVER OR (KITS) ×3 IMPLANT
LEGGING LITHOTOMY PAIR STRL (DRAPES) ×3 IMPLANT
LIGASURE 5MM LAPAROSCOPIC (INSTRUMENTS) IMPLANT
LIGASURE IMPACT 36 18CM CVD LR (INSTRUMENTS) IMPLANT
NS IRRIG 1000ML POUR BTL (IV SOLUTION) ×6 IMPLANT
PAD ARMBOARD 7.5X6 YLW CONV (MISCELLANEOUS) ×6 IMPLANT
PENCIL BUTTON HOLSTER BLD 10FT (ELECTRODE) ×6 IMPLANT
RELOAD EGIA 45 MED/THCK PURPLE (STAPLE) ×6 IMPLANT
RTRCTR WOUND ALEXIS 18CM MED (MISCELLANEOUS)
SCALPEL HARMONIC ACE (MISCELLANEOUS) ×3 IMPLANT
SCISSORS LAP 5X35 DISP (ENDOMECHANICALS) ×3 IMPLANT
SET IRRIG TUBING LAPAROSCOPIC (IRRIGATION / IRRIGATOR) IMPLANT
SLEEVE ENDOPATH XCEL 5M (ENDOMECHANICALS) ×3 IMPLANT
SPECIMEN JAR LARGE (MISCELLANEOUS) ×3 IMPLANT
SPONGE GAUZE 2X2 STER 10/PKG (GAUZE/BANDAGES/DRESSINGS) ×2
SPONGE LAP 18X18 X RAY DECT (DISPOSABLE) ×3 IMPLANT
STAPLER CIRC ILS CVD 25MM (STAPLE) ×3 IMPLANT
STAPLER VISISTAT 35W (STAPLE) ×3 IMPLANT
SUCTION POOLE TIP (SUCTIONS) ×3 IMPLANT
SURGILUBE 2OZ TUBE FLIPTOP (MISCELLANEOUS) ×3 IMPLANT
SUT ETHILON 2 0 FS 18 (SUTURE) ×3 IMPLANT
SUT PDS AB 1 TP1 96 (SUTURE) ×6 IMPLANT
SUT PROLENE 2 0 CT2 30 (SUTURE) ×3 IMPLANT
SUT PROLENE 2 0 KS (SUTURE) ×3 IMPLANT
SUT SILK 2 0 SH CR/8 (SUTURE) ×3 IMPLANT
SUT SILK 2 0 TIES 10X30 (SUTURE) ×3 IMPLANT
SUT SILK 3 0 SH CR/8 (SUTURE) ×6 IMPLANT
SUT SILK 3 0 TIES 10X30 (SUTURE) ×3 IMPLANT
SUT VICRYL 0 UR6 27IN ABS (SUTURE) ×3 IMPLANT
SYR BULB IRRIGATION 50ML (SYRINGE) ×3 IMPLANT
SYS LAPSCP GELPORT 120MM (MISCELLANEOUS) ×3
SYSTEM LAPSCP GELPORT 120MM (MISCELLANEOUS) ×1 IMPLANT
TAPE CLOTH SOFT 2X10 (GAUZE/BANDAGES/DRESSINGS) ×3 IMPLANT
TOWEL OR 17X26 10 PK STRL BLUE (TOWEL DISPOSABLE) ×6 IMPLANT
TRAY FOLEY CATH 16FRSI W/METER (SET/KITS/TRAYS/PACK) ×3 IMPLANT
TRAY LAPAROSCOPIC (CUSTOM PROCEDURE TRAY) ×3 IMPLANT
TRAY PROCTOSCOPIC FIBER OPTIC (SET/KITS/TRAYS/PACK) ×3 IMPLANT
TROCAR XCEL 12X100 BLDLESS (ENDOMECHANICALS) ×3 IMPLANT
TROCAR XCEL BLUNT TIP 100MML (ENDOMECHANICALS) ×3 IMPLANT
TROCAR XCEL NON-BLD 11X100MML (ENDOMECHANICALS) IMPLANT
TROCAR XCEL NON-BLD 5MMX100MML (ENDOMECHANICALS) ×9 IMPLANT
TUBE CONNECTING 12'X1/4 (SUCTIONS) ×2
TUBE CONNECTING 12X1/4 (SUCTIONS) ×4 IMPLANT
TUBING INSUFFLATION (TUBING) ×3 IMPLANT
YANKAUER SUCT BULB TIP NO VENT (SUCTIONS) ×6 IMPLANT

## 2014-06-30 NOTE — Transfer of Care (Signed)
Immediate Anesthesia Transfer of Care Note  Patient: [name]  Procedure(s) Performed: Procedure(s): LAPAROSCOPIC ASSISTED SIGMOID COLECTOMY (N/A)  Patient Location: PACU  Anesthesia Type:General  Level of Consciousness: awake, alert , oriented and patient cooperative  Airway & Oxygen Therapy: Patient Spontanous Breathing and Patient connected to nasal cannula oxygen  Post-op Assessment: Report given to RN and Post -op Vital signs reviewed and stable  Post vital signs: Reviewed and stable  Last Vitals:  Filed Vitals:   06/30/14 0926  BP: 165/98  Pulse: 105  Temp: 36.9 C  Resp: 18    Complications: No apparent anesthesia complications

## 2014-06-30 NOTE — Interval H&P Note (Signed)
History and Physical Interval Note:  06/30/2014 10:38 AM  [name]  has presented today for surgery, with the diagnosis of recurrent sigmoid diverticulitis  The various methods of treatment have been discussed with the patient and family. After consideration of risks, benefits and other options for treatment, the patient has consented to  Procedure(s): LAPAROSCOPIC ASSISTED SIGMOID COLECTOMY (N/A) as a surgical intervention .  The patient's history has been reviewed, patient examined, no change in status, stable for surgery.  I have reviewed the patient's chart and labs.  Questions were answered to the patient's satisfaction.     Marijean Montanye K.

## 2014-06-30 NOTE — Anesthesia Postprocedure Evaluation (Signed)
Anesthesia Post Note  Patient: [name]  Procedure(s) Performed: Procedure(s) (LRB): LAPAROSCOPIC ASSISTED SIGMOID COLECTOMY (N/A)  Anesthesia type: General  Patient location: PACU  Post pain: Pain level controlled  Post assessment: Post-op Vital signs reviewed  Last Vitals: BP 120/76 mmHg  Pulse 97  Temp(Src) 36.7 C (Oral)  Resp 13  Ht 5\' 4"  (1.626 m)  Wt 189 lb 9.5 oz (85.999 kg)  BMI 32.53 kg/m2  SpO2 100%  Post vital signs: Reviewed  Level of consciousness: sedated  Complications: No apparent anesthesia complications

## 2014-06-30 NOTE — Op Note (Signed)
Preop diagnosis: Recurrent sigmoid diverticulitis Postop diagnosis: Same Procedure performed: Laparoscopic assisted sigmoid colectomy Surgeon:Dayne Dekay K. Assistant:  Dr. Jackolyn Confer Anesthesia:  GETT Indications: This is a 44 year old female who presents with a history of 4 attacks of diverticulitis within the last 18 months. She has a area of the mid sigmoid colon that was complicated by microperforation with abscess. She has recovered from this but continues to have intermittent left lower quadrant pain. We recommended elective sigmoid colectomy.  Description of procedure: Patient was brought to the operating room and placed in a supine position on the operative table. After an adequate level of general anesthesia was obtained, a Foley catheter was placed under sterile technique. The patient's legs were placed in lithotomy position in yellowfin stirrups. Her abdomen was prepped with ChloraPrep and her perineum was prepped with Betadine. We draped in sterile fashion. A timeout was taken to ensure the proper patient and proper procedure. A vertical incision was made at the umbilicus. We dissected down to the fascia which was incised vertically. We into the peritoneal cavity bluntly. A stay suture of 0 Vicryl was placed around the fascial opening. The Hassan cannula was inserted and secured with the stay suture. Pneumoperitoneum was obtained by insufflating CO2 maintaining a maximum pressure of 15 mmHg. The laparoscope was inserted and the patient was positioned in Trendelenburg tilted to her right. A 5 mm port was placed in the right lower quadrant. Another 5 mm port was placed in the upper midline. We examined the sigmoid colon. The patient has very little redundancy in her:. The descending colon is tightly adherent to the lateral abdominal wall. The area of diverticular disease is a very short segment at the brim of the pelvis. We began dividing the peritoneal attachments with the harmonic scalpel  along the white line of Toldt.  We mobilized the descending colon and sigmoid colon down into the pelvis. We then position patient in reverse Trendelenburg and began mobilizing the descending colon up to the splenic flexure. The harmonic scalpel was used to dissect the omentum away from the splenic flexure. Another 5 mm port was placed on the patient's left side and this assisted with exposure.  We continued mobilizing the splenic flexure medially and inferiorly. At this point we removed our Hassan cannula and extended our incision to 7 cm. We opened the fascia 7 cm and inserted the GelPort device. With hand-assisted laparoscopy with continued mobilizing the splenic flexure and the descending colon. Once this was adequately mobilized we positioned the patient back in Trendelenburg and began mobilizing the sigmoid of the pelvis. The patient has a relatively short segment of only about 3 cm of stricture. We mobilized the distal sigmoid colon and identified an area of normal tissue distal to the stricture. We skeletonized this area and created an opening in the mesentery with the harmonic scalpel. We upsized the RLQ port site to a 12 mm port.  A laparoscopic stapler (Coviden 45 mm purple load) was used to divide the sigmoid colon. The mesentery was divided with the harmonic scalpel. 2 larger vessels were ligated with ligaclips. We continued dissecting retrograde until we were above the area of the stricture. We then released our pneumoperitoneum and exteriorized the sigmoid colon. We then divided the colon a couple of centimeters above the stricture with cautery. EEA sizers were used and we selected a 25 mm EEA stapler. The anvil was inserted after a pursestring suture of 2-0 Prolene was placed. The suture was tied down around the and  full. We then inspected for hemostasis. I went below and dilated the rectum up to 3 fingers. The 25 mm EEA stapler was advanced up through the rectum and advanced the edge of the staple  line. The spike was advanced through the colon just above the staple line. This was mated with the anvil and tightened down. We held pressure for 30 seconds and then fired the stapler. We held some more pressure in the released stapler. Stapler was removed. The proximal donut was not complete. The distal donut was complete. We then performed an air test with rigid sigmoidoscopy and saline within the pelvis. There was a leak on the right side of the anastomosis. We placed several interrupted 3-0 silk sutures to oversew the side of the anastomosis. We repeated the air test and no further leak was noted. We irrigated thoroughly. A 19 [place] Blake drain was brought in through the left-sided port site and placed across the pelvis to late next of the anastomosis. Our ports were removed. The 12 mm port site was closed with 0 Vicryl.  We changed our gown and gloves in accordance with the protocol. We irrigated. The fascia was reapproximated with double-stranded #1 PDS suture. Staples were used to close the skin. Dressings were applied. The patient was then extubated and brought to the recovery room in stable condition. All sponge, instrument, and needle counts are correct.   [name]. Annette Dover, MD, [facility]  General/ Trauma Surgery  06/30/2014 3:24 PM

## 2014-06-30 NOTE — H&P (View-Only) (Signed)
[name] [facility] 06/06/2014 10:09 AM Location: [facility] Patient #: [id number] DOB: June 04, 1970 Married / Language: English / Race: Black or African American Female History of Present Illness [name]. Annette Sisk MD; 06/06/2014 2:12 PM) Patient words: reck.  The patient is a 44 year old female who presents with diverticulitis. GI - Dr. Leonie Douglas  This is a 44 year old female seen in follow-up from a hospital admission for recurrent sigmoid diverticulitis. She has had multiple episodes of diverticulitis (4 attacks) over the last 18 months. She presented to the hospital on 05/20/14 with left lower quadrant abdominal pain and a CT scan shows sigmoid diverticulitis with an area of perforation with a small contained abscess. She has improved on Cipro and Flagyl. Currently she is minimally symptomatic. She had a colonoscopy in November 2015 by Dr. Leonie Douglas that revealed 2 small polyps that were biopsied and were benign. She also has multiple diverticula in the sigmoid colon.  Since she has had so many episodes in the last couple of years, she would like to discuss elective sigmoid colectomy.   CLINICAL DATA: Acute onset of left lower quadrant abdominal pain and left lower back pain. Chills and constipation. Initial encounter.  EXAM: CT ABDOMEN AND PELVIS WITH CONTRAST  TECHNIQUE: Multidetector CT imaging of the abdomen and pelvis was performed using the standard protocol following bolus administration of intravenous contrast.  CONTRAST: 165mL OMNIPAQUE IOHEXOL 300 MG/ML SOLN  COMPARISON: CT of the abdomen and pelvis performed 02/04/2014  FINDINGS: Minimal right basilar atelectasis is noted.  The liver and spleen are unremarkable in appearance. The gallbladder is within normal limits. The pancreas and adrenal glands are unremarkable.  The kidneys are unremarkable in appearance. There is no evidence of hydronephrosis. No renal or ureteral stones are seen. No  perinephric stranding is appreciated.  No free fluid is identified. The small bowel is unremarkable in appearance. The stomach is within normal limits. No acute vascular abnormalities are seen. Mild scattered calcification is seen along the distal abdominal aorta and its branches.  The appendix is normal in caliber and contains trace air, without evidence of appendicitis. The colon is partially filled with stool.  There appears to be localized perforation along the proximal sigmoid colon, with a 2.3 x 1.8 cm collection of extraluminal fluid and air noted adjacent to the proximal sigmoid colon, and surrounding soft tissue inflammation. There is mild associated wall thickening along the proximal sigmoid colon. Findings are compatible with diverticulitis, with associated contained perforation. Underlying diverticulosis is noted along the distal descending and proximal sigmoid colon.  The bladder is mildly distended and grossly unremarkable. The patient is status post hysterectomy. No suspicious adnexal masses are seen. The ovaries are relatively symmetric, though minimal soft tissue inflammation is seen tracking adjacent to the left ovary and left ureter at the site of diverticulitis. No inguinal lymphadenopathy is seen.  No acute osseous abnormalities are identified.  IMPRESSION: 1. Acute diverticulitis at the proximal sigmoid colon, with a contained perforation. A 2.3 x 1.8 cm collection of extraluminal fluid and air is seen adjacent to the proximal sigmoid colon, with surrounding soft tissue inflammation and mild colonic wall thickening. 2. Underlying diverticulosis along the distal descending and proximal sigmoid colon. 3. Minimal soft tissue inflammation tracks about the adjacent left ureter and near the left ovary. 4. Mild scattered calcification along the distal abdominal aorta and its branches.  These results were called by telephone at the time of interpretation on  05/20/2014 at 3:00 am to Dr. Shanon Brow  YEL[place], who verbally acknowledged these results.   Electronically Signed By: Garald Balding M.D. On: 05/20/2014 03:00 Other Problems (Sonya Bynum, CMA; 06/06/2014 10:09 AM) Diverticulosis Gastroesophageal Reflux Disease Heart murmur High blood pressure Transfusion history  Past Surgical History [name], CMA; 06/06/2014 10:09 AM) Breast Biopsy Right. Colon Polyp Removal - Colonoscopy Hysterectomy (not due to cancer) - Partial  Diagnostic Studies History [name], CMA; 06/06/2014 10:09 AM) Colonoscopy within last year Mammogram 1-3 years ago  Allergies [name], CMA; 06/06/2014 10:11 AM) Keflex *CEPHALOSPORINS* Penicillamine *ASSORTED CLASSES*  Medication History (Sonya Bynum, CMA; 06/06/2014 10:11 AM) Lisinopril (10MG  Tablet, Oral) Active. MetroNIDAZOLE (500MG  Tablet, Oral) Active. Ciclopirox (0.77% Gel, External) Active. Hydrocodone-Acetaminophen (7.5-325MG  Tablet, Oral as needed) Active. Medications Reconciled  Social History [name], CMA; 06/06/2014 10:09 AM) Alcohol use Remotely quit alcohol use. Caffeine use Carbonated beverages, Coffee, Tea. Illicit drug use Remotely quit drug use. Tobacco use Former smoker.  Family History [name], [place]; 06/06/2014 10:09 AM) Arthritis Mother. Heart disease in female family member before age 22 Hypertension Mother.  Pregnancy / Birth History [name], [place]; 06/06/2014 10:09 AM) Age at menarche 39 years. Gravida 4 Irregular periods Maternal age 57-20 Para 2     Review of Systems ([place]; 06/06/2014 10:09 AM) General Not Present- Appetite Loss, Chills, Fatigue, Fever, Night Sweats, Weight Gain and Weight Loss. Skin Not Present- Change in Wart/Mole, Dryness, Hives, Jaundice, New Lesions, Non-Healing Wounds, Rash and Ulcer. HEENT Not Present- Earache, Hearing Loss, Hoarseness, Nose Bleed, Oral Ulcers, Ringing in the Ears, Seasonal  Allergies, Sinus Pain, Sore Throat, Visual Disturbances, Wears glasses/contact lenses and Yellow Eyes. Respiratory Not Present- Bloody sputum, Chronic Cough, Difficulty Breathing, Snoring and Wheezing. Breast Not Present- Breast Mass, Breast Pain, Nipple Discharge and Skin Changes. Cardiovascular Not Present- Chest Pain, Difficulty Breathing Lying Down, Leg Cramps, Palpitations, Rapid Heart Rate, Shortness of Breath and Swelling of Extremities. Gastrointestinal Present- Bloating, Excessive gas and Indigestion. Not Present- Abdominal Pain, Bloody Stool, Change in Bowel Habits, Chronic diarrhea, Constipation, Difficulty Swallowing, Gets full quickly at meals, Hemorrhoids, Nausea, Rectal Pain and Vomiting. Female Genitourinary Not Present- Frequency, Nocturia, Painful Urination, Pelvic Pain and Urgency. Musculoskeletal Not Present- Back Pain, Joint Pain, Joint Stiffness, Muscle Pain, Muscle Weakness and Swelling of Extremities. Neurological Not Present- Decreased Memory, Fainting, Headaches, Numbness, Seizures, Tingling, Tremor, Trouble walking and Weakness. Psychiatric Not Present- Anxiety, Bipolar, Change in Sleep Pattern, Depression, Fearful and Frequent crying. Endocrine Not Present- Cold Intolerance, Excessive Hunger, Hair Changes, Heat Intolerance, Hot flashes and New Diabetes. Hematology Not Present- Easy Bruising, Excessive bleeding, Gland problems, HIV and Persistent Infections.  Vitals (Sonya Bynum CMA; 06/06/2014 10:10 AM) 06/06/2014 10:10 AM Weight: 189 lb Height: 64in Body Surface Area: 1.97 m Body Mass Index: 32.44 kg/m Temp.: 43F(Temporal)  Pulse: 77 (Regular)  BP: 132/80 (Sitting, Left Arm, Standard)     Physical Exam Rodman Key K. Pallas Wahlert MD; 06/06/2014 2:11 PM)  The physical exam findings are as follows: Note:WDWN in NAD HEENT: EOMI, sclera anicteric Neck: No masses, no thyromegaly Lungs: CTA bilaterally; normal respiratory effort CV: Regular rate and rhythm; no  murmurs Abd: +bowel sounds, soft, mild LLQ tenderness; no palpable masses  Ext: Well-perfused; no edema Skin: Warm, dry; no sign of jaundice  General Note: WDWN in NAD HEENT: EOMI, sclera anicteric Neck: No masses, no thyromegaly Lungs: CTA bilaterally; normal respiratory effort CV: Regular rate and rhythm; no murmurs Abd: +bowel sounds, soft, non-tender, no masses Ext: Well-perfused; no edema Skin: Warm, dry; no sign of jaundice  Assessment & Plan [name]. Jamilla Galli MD; 06/06/2014 2:12 PM)  SIGMOID DIVERTICULITIS (562.11  K57.32)  Current Plans Schedule for Surgery - laparoscopic assisted sigmoid colectomy. The surgical procedure has been discussed with the patient. I offered her a referral for possible robotic surgery, but she prefers to have me perform the surgery. One day bowel prep with antibiotics. Potential risks, benefits, alternative treatments, and expected outcomes have been explained. All of the patient's questions at this time have been answered. The likelihood of reaching the patient's treatment goal is good. The patient understand the proposed surgical procedure and wishes to proceed. Started Neomycin Sulfate 500MG , 2 (two) Tablet SEE NOTE, #6, 06/06/2014, No Refill. Local Order: TAKE TWO TABLETS AT 2 PM, 3 PM, AND 10 PM THE DAY PRIOR TO SURGERY Started Flagyl 500MG , 2 (two) Tablet SEE NOTE, #6, 06/06/2014, No Refill. Local Order: Take at 2pm, 3pm, and 10pm the day prior to your colon operation Started MiraLax, SEE NOTE Powder one time dose, 454 Gram, 06/06/2014, No Refill. Local Order: Start at 1 PM on the day prior to surgery. Mix with 64 oz of Gatorade and drink 8 oz q 1 hours x 8 hours.      [name]. Georgette Dover, MD, [facility]  General/ Trauma Surgery  06/06/2014 2:16 PM

## 2014-06-30 NOTE — Anesthesia Preprocedure Evaluation (Signed)
Anesthesia Evaluation  Patient identified by MRN, date of birth, ID band Patient awake    Reviewed: Allergy & Precautions, H&P , NPO status , Patient's Chart, lab work & pertinent test results  Airway Mallampati: II  TM Distance: >3 FB Neck ROM: full    Dental   Pulmonary neg pulmonary ROS, Current Smoker, former smoker,  breath sounds clear to auscultation        Cardiovascular hypertension, Pt. on medications negative cardio ROS  Rhythm:regular     Neuro/Psych  Headaches, PSYCHIATRIC DISORDERS Depression    GI/Hepatic Neg liver ROS, GERD-  Medicated,  Endo/Other  negative endocrine ROS  Renal/GU Renal disease     Musculoskeletal   Abdominal   Peds  Hematology negative hematology ROS (+)   Anesthesia Other Findings See surgeon's H&P   Reproductive/Obstetrics negative OB ROS                             Anesthesia Physical  Anesthesia Plan  ASA: II  Anesthesia Plan: General   Post-op Pain Management:    Induction: Intravenous  Airway Management Planned: Oral ETT  Additional Equipment:   Intra-op Plan:   Post-operative Plan: Extubation in OR  Informed Consent: I have reviewed the patients History and Physical, chart, labs and discussed the procedure including the risks, benefits and alternatives for the proposed anesthesia with the patient or authorized representative who has indicated his/her understanding and acceptance.   Dental advisory given  Plan Discussed with: CRNA  Anesthesia Plan Comments:         Anesthesia Quick Evaluation

## 2014-06-30 NOTE — Progress Notes (Signed)
Received from PACU at 1850, oriented to unit, PCA teaching reinforced.  Will let noc's do full assessment.

## 2014-07-01 LAB — CBC
HCT: 36.5 % (ref 36.0–46.0)
Hemoglobin: 11.6 g/dL — ABNORMAL LOW (ref 12.0–15.0)
MCH: 28.9 pg (ref 26.0–34.0)
MCHC: 31.8 g/dL (ref 30.0–36.0)
MCV: 91 fL (ref 78.0–100.0)
PLATELETS: 329 10*3/uL (ref 150–400)
RBC: 4.01 MIL/uL (ref 3.87–5.11)
RDW: 13.1 % (ref 11.5–15.5)
WBC: 8.7 10*3/uL (ref 4.0–10.5)

## 2014-07-01 LAB — BASIC METABOLIC PANEL
ANION GAP: 9 (ref 5–15)
BUN: <5 mg/dL — ABNORMAL LOW (ref 6–20)
CALCIUM: 8.5 mg/dL — AB (ref 8.9–10.3)
CO2: 25 mmol/L (ref 22–32)
Chloride: 103 mmol/L (ref 101–111)
Creatinine, Ser: 0.75 mg/dL (ref 0.44–1.00)
GFR calc Af Amer: >60 mL/min (ref >60)
GFR calc non Af Amer: >60 mL/min (ref >60)
GLUCOSE: 95 mg/dL (ref 65–99)
POTASSIUM: 3.8 mmol/L (ref 3.5–5.1)
Sodium: 137 mmol/L (ref 135–145)

## 2014-07-01 MED ORDER — KETOROLAC TROMETHAMINE 30 MG/ML IJ SOLN
30.0000 mg | Freq: Four times a day (QID) | INTRAMUSCULAR | Status: DC
  Administered 2014-07-01 – 2014-07-05 (×17): 30 mg via INTRAVENOUS
  Filled 2014-07-01 (×17): qty 1

## 2014-07-01 MED ORDER — PANTOPRAZOLE SODIUM 40 MG IV SOLR
40.0000 mg | INTRAVENOUS | Status: DC
  Administered 2014-07-01 – 2014-07-02 (×2): 40 mg via INTRAVENOUS
  Filled 2014-07-01 (×2): qty 40

## 2014-07-01 NOTE — Progress Notes (Signed)
1 Day Post-Op  Subjective: Patient is quite sore today No nausea No flatus yet Foley just removed Objective: Vital signs in last 24 hours: Temp:  [98 F (36.7 C)-98.5 F (36.9 C)] 98.5 F (36.9 C) (05/13 0544) Pulse Rate:  [91-105] 99 (05/13 0544) Resp:  [10-19] 16 (05/13 0544) BP: (113-165)/(60-98) 113/60 mmHg (05/13 0544) SpO2:  [99 %-100 %] 99 % (05/13 0544) Weight:  [85.999 kg (189 lb 9.5 oz)] 85.999 kg (189 lb 9.5 oz) (05/12 0926) Last BM Date: 06/30/14  Intake/Output from previous day: 05/12 0701 - 05/13 0700 In: 3795 [I.V.:3795] Out: 795 [Urine:600; Drains:170; Blood:25] Intake/Output this shift:    General appearance: alert, cooperative and no distress Resp: clear to auscultation bilaterally Cardio: regular rate and rhythm, S1, S2 normal, no murmur, click, rub or gallop GI: moderately tender; hypoactive bowel sounds Incisions - honeycomb dressing dry; port sites dry  Lab Results:   Recent Labs  06/30/14 2032 07/01/14 0247  WBC 10.6* 8.7  HGB 11.8* 11.6*  HCT 36.2 36.5  PLT 340 329   BMET  Recent Labs  06/30/14 2032 07/01/14 0247  NA  --  137  K  --  3.8  CL  --  103  CO2  --  25  GLUCOSE  --  95  BUN  --  <5*  CREATININE 0.85 0.75  CALCIUM  --  8.5*   PT/INR No results for input(s): LABPROT, INR in the last 72 hours. ABG No results for input(s): PHART, HCO3 in the last 72 hours.  Invalid input(s): PCO2, PO2  Studies/Results: No results found.  Anti-infectives: Anti-infectives    Start     Dose/Rate Route Frequency Ordered Stop   06/30/14 2300  ciprofloxacin (CIPRO) IVPB 400 mg     400 mg 200 mL/hr over 60 Minutes Intravenous Every 12 hours 06/30/14 1855 06/30/14 2336   06/30/14 1000  clindamycin (CLEOCIN) IVPB 900 mg     900 mg 100 mL/hr over 30 Minutes Intravenous To Surgery 06/29/14 1324 06/30/14 1113   06/30/14 1000  gentamicin (GARAMYCIN) IVPB 100 mg     100 mg 200 mL/hr over 30 Minutes Intravenous To Surgery 06/29/14 1324  06/30/14 1146      Assessment/Plan: s/p Procedure(s): LAPAROSCOPIC ASSISTED SIGMOID COLECTOMY (N/A) Encourage ambulation  Await return of bowel function prior to advancing diet Continue PCA for now Add Toradol   LOS: 1 day    Parry Po K. 07/01/2014

## 2014-07-01 NOTE — Care Management Note (Signed)
Case Management Note  Patient Details  Name: [name] MRN: [id number] Date of Birth: March 22, 1970  Subjective/Objective:                    Action/Plan: UR completed   Expected Discharge Date:      07-04-14             Expected Discharge Plan:  Home/Self Care  In-House Referral:     Discharge planning Services     Post Acute Care Choice:    Choice offered to:     DME Arranged:    DME Agency:     HH Arranged:    [place] Agency:     Status of Service:  In process, will continue to follow  Medicare Important Message Given:    Date Medicare IM Given:    Medicare IM give by:    Date Additional Medicare IM Given:    Additional Medicare Important Message give by:     If discussed at [place] of Stay Meetings, dates discussed:    Additional Comments:  Marilu Favre, RN 07/01/2014, 8:02 AM

## 2014-07-02 LAB — BASIC METABOLIC PANEL
Anion gap: 8 (ref 5–15)
BUN: <5 mg/dL — ABNORMAL LOW (ref 6–20)
CALCIUM: 8.1 mg/dL — AB (ref 8.9–10.3)
CO2: 26 mmol/L (ref 22–32)
Chloride: 104 mmol/L (ref 101–111)
Creatinine, Ser: 0.77 mg/dL (ref 0.44–1.00)
GFR calc Af Amer: >60 mL/min (ref >60)
GFR calc non Af Amer: >60 mL/min (ref >60)
Glucose, Bld: 97 mg/dL (ref 65–99)
Potassium: 3.3 mmol/L — ABNORMAL LOW (ref 3.5–5.1)
Sodium: 138 mmol/L (ref 135–145)

## 2014-07-02 LAB — CBC
HEMATOCRIT: 34.2 % — AB (ref 36.0–46.0)
HEMOGLOBIN: 10.9 g/dL — AB (ref 12.0–15.0)
MCH: 29.5 pg (ref 26.0–34.0)
MCHC: 31.9 g/dL (ref 30.0–36.0)
MCV: 92.7 fL (ref 78.0–100.0)
Platelets: 314 10*3/uL (ref 150–400)
RBC: 3.69 MIL/uL — ABNORMAL LOW (ref 3.87–5.11)
RDW: 13 % (ref 11.5–15.5)
WBC: 6.7 10*3/uL (ref 4.0–10.5)

## 2014-07-02 NOTE — Progress Notes (Signed)
2 Days Post-Op  Subjective: More comfortable today No flatus yet, denies nausea  Objective: Vital signs in last 24 hours: Temp:  [98.2 F (36.8 C)-99.1 F (37.3 C)] 98.5 F (36.9 C) (05/14 0607) Pulse Rate:  [80-90] 89 (05/14 0607) Resp:  [13-18] 14 (05/14 0800) BP: (110-149)/(57-72) 149/72 mmHg (05/14 0607) SpO2:  [97 %-100 %] 97 % (05/14 0800) Last BM Date: 06/30/14  Intake/Output from previous day: 05/13 0701 - 05/14 0700 In: 2606.7 [P.O.:720; I.V.:1886.7] Out: 1775 [Urine:1575; Drains:200] Intake/Output this shift:     Lungs clear Abdomen soft, non distended, +bowel sounds Drain serousang  Lab Results:   Recent Labs  07/01/14 0247 07/02/14 0613  WBC 8.7 6.7  HGB 11.6* 10.9*  HCT 36.5 34.2*  PLT 329 314   BMET  Recent Labs  07/01/14 0247 07/02/14 0613  NA 137 138  K 3.8 3.3*  CL 103 104  CO2 25 26  GLUCOSE 95 97  BUN <5* <5*  CREATININE 0.75 0.77  CALCIUM 8.5* 8.1*   PT/INR No results for input(s): LABPROT, INR in the last 72 hours. ABG No results for input(s): PHART, HCO3 in the last 72 hours.  Invalid input(s): PCO2, PO2  Studies/Results: No results found.  Anti-infectives: Anti-infectives    Start     Dose/Rate Route Frequency Ordered Stop   06/30/14 2300  ciprofloxacin (CIPRO) IVPB 400 mg     400 mg 200 mL/hr over 60 Minutes Intravenous Every 12 hours 06/30/14 1855 06/30/14 2336   06/30/14 1000  clindamycin (CLEOCIN) IVPB 900 mg     900 mg 100 mL/hr over 30 Minutes Intravenous To Surgery 06/29/14 1324 06/30/14 1113   06/30/14 1000  gentamicin (GARAMYCIN) IVPB 100 mg     100 mg 200 mL/hr over 30 Minutes Intravenous To Surgery 06/29/14 1324 06/30/14 1146      Assessment/Plan: s/p Procedure(s): LAPAROSCOPIC ASSISTED SIGMOID COLECTOMY (N/A)  Ambulate Keep on clears  LOS: 2 days    Efrain Clauson A 07/02/2014

## 2014-07-03 MED ORDER — HYDROMORPHONE HCL 1 MG/ML IJ SOLN
1.0000 mg | INTRAMUSCULAR | Status: DC | PRN
Start: 2014-07-03 — End: 2014-07-05
  Administered 2014-07-04: 1 mg via INTRAVENOUS
  Filled 2014-07-03: qty 1

## 2014-07-03 MED ORDER — OXYCODONE-ACETAMINOPHEN 5-325 MG PO TABS
1.0000 | ORAL_TABLET | ORAL | Status: DC | PRN
Start: 2014-07-03 — End: 2014-07-05
  Administered 2014-07-03 – 2014-07-05 (×6): 2 via ORAL
  Filled 2014-07-03 (×6): qty 2

## 2014-07-03 NOTE — Progress Notes (Signed)
3 Days Post-Op  Subjective: Feels well today Passing flatus No nausea or bloating  Objective: Vital signs in last 24 hours: Temp:  [98 F (36.7 C)-98.4 F (36.9 C)] 98.4 F (36.9 C) (05/15 0527) Pulse Rate:  [87-99] 87 (05/15 0527) Resp:  [11-18] 12 (05/15 0803) BP: (132-150)/(74-79) 132/75 mmHg (05/15 0527) SpO2:  [96 %-100 %] 97 % (05/15 0803) Last BM Date: 06/30/14 (before surgery)  Intake/Output from previous day: 05/14 0701 - 05/15 0700 In: 1440 [P.O.:1440] Out: 4160 [Urine:3900; Drains:260] Intake/Output this shift:    Abdomen soft, good BS Dressing dry Drain serous  Lab Results:   Recent Labs  07/01/14 0247 07/02/14 0613  WBC 8.7 6.7  HGB 11.6* 10.9*  HCT 36.5 34.2*  PLT 329 314   BMET  Recent Labs  07/01/14 0247 07/02/14 0613  NA 137 138  K 3.8 3.3*  CL 103 104  CO2 25 26  GLUCOSE 95 97  BUN <5* <5*  CREATININE 0.75 0.77  CALCIUM 8.5* 8.1*   PT/INR No results for input(s): LABPROT, INR in the last 72 hours. ABG No results for input(s): PHART, HCO3 in the last 72 hours.  Invalid input(s): PCO2, PO2  Studies/Results: No results found.  Anti-infectives: Anti-infectives    Start     Dose/Rate Route Frequency Ordered Stop   06/30/14 2300  ciprofloxacin (CIPRO) IVPB 400 mg     400 mg 200 mL/hr over 60 Minutes Intravenous Every 12 hours 06/30/14 1855 06/30/14 2336   06/30/14 1000  clindamycin (CLEOCIN) IVPB 900 mg     900 mg 100 mL/hr over 30 Minutes Intravenous To Surgery 06/29/14 1324 06/30/14 1113   06/30/14 1000  gentamicin (GARAMYCIN) IVPB 100 mg     100 mg 200 mL/hr over 30 Minutes Intravenous To Surgery 06/29/14 1324 06/30/14 1146      Assessment/Plan: s/p Procedure(s): LAPAROSCOPIC ASSISTED SIGMOID COLECTOMY (N/A)  D/c PCA Full liquid diet  LOS: 3 days    Anniyah Mood A 07/03/2014

## 2014-07-04 ENCOUNTER — Encounter (HOSPITAL_COMMUNITY): Payer: Self-pay | Admitting: Surgery

## 2014-07-04 MED ORDER — PANTOPRAZOLE SODIUM 40 MG PO TBEC
40.0000 mg | DELAYED_RELEASE_TABLET | Freq: Every day | ORAL | Status: DC
  Administered 2014-07-04: 40 mg via ORAL

## 2014-07-04 NOTE — Care Management Note (Signed)
Case Management Note  Patient Details  Name: [name] MRN: [id number] Date of Birth: 10-Aug-1970  Subjective/Objective:                    Action/Plan:  UR updated.  Expected Discharge Date:       07-05-14            Expected Discharge Plan:  Home/Self Care  In-House Referral:     Discharge planning Services     Post Acute Care Choice:    Choice offered to:     DME Arranged:    DME Agency:     HH Arranged:    HH Agency:     Status of Service:  Completed, signed off  Medicare Important Message Given:    Date Medicare IM Given:    Medicare IM give by:    Date Additional Medicare IM Given:    Additional Medicare Important Message give by:     If discussed at [place] of Stay Meetings, dates discussed:    Additional Comments:  Marilu Favre, RN 07/04/2014, 10:37 AM

## 2014-07-04 NOTE — Progress Notes (Signed)
4 Days Post-Op  Subjective: Patient reports more flatus, but no bowel movements Tolerating full liquids without difficulty Pain well controlled with Toradol and Percocet Drain - serosanguinous drainage  Objective: Vital signs in last 24 hours: Temp:  [98.3 F (36.8 C)-98.8 F (37.1 C)] 98.8 F (37.1 C) (05/16 0447) Pulse Rate:  [73-82] 79 (05/16 0447) Resp:  [12-18] 18 (05/16 0447) BP: (133-149)/(77-89) 149/77 mmHg (05/16 0447) SpO2:  [97 %-100 %] 100 % (05/16 0447) Last BM Date: 06/30/14  Intake/Output from previous day: 05/15 0701 - 05/16 0700 In: 3047.5 [P.O.:1520; I.V.:1527.5] Out: 5180 [Urine:5000; Drains:180] Intake/Output this shift:    General appearance: alert, cooperative and no distress GI: mild incisional tenderness Incisions c/d/i; drain - thin serosanguinous output  Lab Results:   Recent Labs  07/02/14 0613  WBC 6.7  HGB 10.9*  HCT 34.2*  PLT 314   BMET  Recent Labs  07/02/14 0613  NA 138  K 3.3*  CL 104  CO2 26  GLUCOSE 97  BUN <5*  CREATININE 0.77  CALCIUM 8.1*   PT/INR No results for input(s): LABPROT, INR in the last 72 hours. ABG No results for input(s): PHART, HCO3 in the last 72 hours.  Invalid input(s): PCO2, PO2  Studies/Results: No results found.  Anti-infectives: Anti-infectives    Start     Dose/Rate Route Frequency Ordered Stop   06/30/14 2300  ciprofloxacin (CIPRO) IVPB 400 mg     400 mg 200 mL/hr over 60 Minutes Intravenous Every 12 hours 06/30/14 1855 06/30/14 2336   06/30/14 1000  clindamycin (CLEOCIN) IVPB 900 mg     900 mg 100 mL/hr over 30 Minutes Intravenous To Surgery 06/29/14 1324 06/30/14 1113   06/30/14 1000  gentamicin (GARAMYCIN) IVPB 100 mg     100 mg 200 mL/hr over 30 Minutes Intravenous To Surgery 06/29/14 1324 06/30/14 1146      Assessment/Plan: s/p Procedure(s): LAPAROSCOPIC ASSISTED SIGMOID COLECTOMY (N/A) Plan for discharge tomorrow Saline lock fluids today  Advance diet to regular when  she has a bowel movement  LOS: 4 days    [name] K. 07/04/2014

## 2014-07-05 ENCOUNTER — Encounter (HOSPITAL_COMMUNITY): Payer: Self-pay | Admitting: Emergency Medicine

## 2014-07-05 ENCOUNTER — Inpatient Hospital Stay (HOSPITAL_COMMUNITY)
Admission: EM | Admit: 2014-07-05 | Discharge: 2014-07-11 | DRG: 331 | Disposition: A | Discharge status: Home / Self Care | Payer: BLUE CROSS/BLUE SHIELD | Attending: Surgery | Admitting: Surgery

## 2014-07-05 DIAGNOSIS — K219 Gastro-esophageal reflux disease without esophagitis: Secondary | ICD-10-CM | POA: Diagnosis present

## 2014-07-05 DIAGNOSIS — Z90710 Acquired absence of both cervix and uterus: Secondary | ICD-10-CM

## 2014-07-05 DIAGNOSIS — Z791 Long term (current) use of non-steroidal anti-inflammatories (NSAID): Secondary | ICD-10-CM

## 2014-07-05 DIAGNOSIS — Z9049 Acquired absence of other specified parts of digestive tract: Secondary | ICD-10-CM

## 2014-07-05 DIAGNOSIS — R509 Fever, unspecified: Secondary | ICD-10-CM | POA: Diagnosis present

## 2014-07-05 DIAGNOSIS — Z87891 Personal history of nicotine dependence: Secondary | ICD-10-CM

## 2014-07-05 DIAGNOSIS — K668 Other specified disorders of peritoneum: Secondary | ICD-10-CM | POA: Diagnosis not present

## 2014-07-05 DIAGNOSIS — Z880 Allergy status to penicillin: Secondary | ICD-10-CM

## 2014-07-05 DIAGNOSIS — Z881 Allergy status to other antibiotic agents status: Secondary | ICD-10-CM

## 2014-07-05 DIAGNOSIS — I10 Essential (primary) hypertension: Secondary | ICD-10-CM | POA: Diagnosis present

## 2014-07-05 DIAGNOSIS — R1032 Left lower quadrant pain: Secondary | ICD-10-CM

## 2014-07-05 DIAGNOSIS — Y832 Surgical operation with anastomosis, bypass or graft as the cause of abnormal reaction of the patient, or of later complication, without mention of misadventure at the time of the procedure: Secondary | ICD-10-CM | POA: Diagnosis present

## 2014-07-05 DIAGNOSIS — K9189 Other postprocedural complications and disorders of digestive system: Principal | ICD-10-CM | POA: Diagnosis present

## 2014-07-05 DIAGNOSIS — Z79899 Other long term (current) drug therapy: Secondary | ICD-10-CM

## 2014-07-05 LAB — BASIC METABOLIC PANEL
Anion gap: 11 (ref 5–15)
BUN: <5 mg/dL — ABNORMAL LOW (ref 6–20)
CO2: 28 mmol/L (ref 22–32)
Calcium: 9.5 mg/dL (ref 8.9–10.3)
Chloride: 97 mmol/L — ABNORMAL LOW (ref 101–111)
Creatinine, Ser: 0.91 mg/dL (ref 0.44–1.00)
Glucose, Bld: 148 mg/dL — ABNORMAL HIGH (ref 65–99)
POTASSIUM: 3.6 mmol/L (ref 3.5–5.1)
SODIUM: 136 mmol/L (ref 135–145)

## 2014-07-05 LAB — CBC WITH DIFFERENTIAL/PLATELET
Basophils Absolute: 0 10*3/uL (ref 0.0–0.1)
Basophils Relative: 0 % (ref 0–1)
Eosinophils Absolute: 0.3 10*3/uL (ref 0.0–0.7)
Eosinophils Relative: 2 % (ref 0–5)
HCT: 40.9 % (ref 36.0–46.0)
Hemoglobin: 14 g/dL (ref 12.0–15.0)
LYMPHS PCT: 9 % — AB (ref 12–46)
Lymphs Abs: 1.2 10*3/uL (ref 0.7–4.0)
MCH: 30.4 pg (ref 26.0–34.0)
MCHC: 34.2 g/dL (ref 30.0–36.0)
MCV: 88.9 fL (ref 78.0–100.0)
MONO ABS: 1 10*3/uL (ref 0.1–1.0)
MONOS PCT: 8 % (ref 3–12)
NEUTROS ABS: 10.6 10*3/uL — AB (ref 1.7–7.7)
NEUTROS PCT: 81 % — AB (ref 43–77)
Platelets: 426 10*3/uL — ABNORMAL HIGH (ref 150–400)
RBC: 4.6 MIL/uL (ref 3.87–5.11)
RDW: 12.8 % (ref 11.5–15.5)
WBC: 13 10*3/uL — ABNORMAL HIGH (ref 4.0–10.5)

## 2014-07-05 LAB — URINALYSIS, ROUTINE W REFLEX MICROSCOPIC
BILIRUBIN URINE: NEGATIVE
Glucose, UA: NEGATIVE mg/dL
HGB URINE DIPSTICK: NEGATIVE
Ketones, ur: NEGATIVE mg/dL
Leukocytes, UA: NEGATIVE
Nitrite: NEGATIVE
PH: 5.5 (ref 5.0–8.0)
Protein, ur: NEGATIVE mg/dL
SPECIFIC GRAVITY, URINE: 1.015 (ref 1.005–1.030)
Urobilinogen, UA: 0.2 mg/dL (ref 0.0–1.0)

## 2014-07-05 LAB — I-STAT CHEM 8, ED
BUN: <3 mg/dL — ABNORMAL LOW (ref 6–20)
CHLORIDE: 97 mmol/L — AB (ref 101–111)
CREATININE: 0.8 mg/dL (ref 0.44–1.00)
Calcium, Ion: 1.19 mmol/L (ref 1.12–1.23)
Glucose, Bld: 165 mg/dL — ABNORMAL HIGH (ref 65–99)
HCT: 44 % (ref 36.0–46.0)
HEMOGLOBIN: 15 g/dL (ref 12.0–15.0)
POTASSIUM: 3.9 mmol/L (ref 3.5–5.1)
SODIUM: 137 mmol/L (ref 135–145)
TCO2: 23 mmol/L (ref 0–100)

## 2014-07-05 MED ORDER — IOHEXOL 300 MG/ML  SOLN
25.0000 mL | Freq: Once | INTRAMUSCULAR | Status: AC | PRN
  Administered 2014-07-05: 25 mL via ORAL

## 2014-07-05 MED ORDER — OXYCODONE-ACETAMINOPHEN 5-325 MG PO TABS: 1.0000 | ORAL_TABLET | ORAL | Status: DC | PRN

## 2014-07-05 MED ORDER — ONDANSETRON HCL 4 MG/2ML IJ SOLN
4.0000 mg | Freq: Once | INTRAMUSCULAR | Status: AC
  Administered 2014-07-05: 4 mg via INTRAVENOUS
  Filled 2014-07-05: qty 2

## 2014-07-05 MED ORDER — MORPHINE SULFATE 4 MG/ML IJ SOLN
4.0000 mg | Freq: Once | INTRAMUSCULAR | Status: AC
  Administered 2014-07-05: 4 mg via INTRAVENOUS
  Filled 2014-07-05: qty 1

## 2014-07-05 NOTE — Discharge Instructions (Signed)
[facility], [place] [phone]  COLON SURGERY: POST OP INSTRUCTIONS  Always review your discharge instruction sheet given to you by the facility where your surgery was performed.  IF YOU HAVE DISABILITY OR FAMILY LEAVE FORMS, YOU MUST BRING THEM TO THE OFFICE FOR PROCESSING.  PLEASE DO NOT GIVE THEM TO YOUR DOCTOR.  1. A prescription for pain medication may be given to you upon discharge.  Take your pain medication as prescribed, if needed.  If narcotic pain medicine is not needed, then you may take acetaminophen (Tylenol) or ibuprofen (Advil) as needed. 2. Take your usually prescribed medications unless otherwise directed. 3. If you need a refill on your pain medication, please contact your pharmacy. They will contact our office to request authorization.  Prescriptions will not be filled after 5pm or on week-ends. 4. You should follow a light diet the first few days after arrival home, such as soup and crackers, pudding, etc.unless your doctor has advised otherwise. A high-fiber, low fat diet can be resumed as tolerated.   Be sure to include lots of fluids daily. Most patients will experience some swelling and bruising on the chest and neck area.  Ice packs will help.  Swelling and bruising can take several days to resolve 5. Most patients will experience some swelling and bruising in the area of the incision. Ice pack will help. Swelling and bruising can take several days to resolve..  6. It is common to experience some constipation if taking pain medication after surgery.  Increasing fluid intake and taking a stool softener will usually help or prevent this problem from occurring.  A mild laxative (Milk of Magnesia or Miralax) should be taken according to package directions if there are no bowel movements after 48 hours. 7.   Any sutures or staples will be removed at the office during your follow-up visit. You may find that a light gauze bandage over your incision may keep your  staples from being rubbed or pulled. You may shower and replace the bandage daily. 8. ACTIVITIES:  You may resume regular (light) daily activities beginning the next day--such as daily self-care, walking, climbing stairs--gradually increasing activities as tolerated.  You may have sexual intercourse when it is comfortable.  Refrain from any heavy lifting or straining until approved by your doctor. a. You may drive when you no longer are taking prescription pain medication, you can comfortably wear a seatbelt, and you can safely maneuver your car and apply brakes b. Return to Work: ___________________________________ 59. You should see your doctor in the office for a follow-up appointment approximately two weeks after your surgery.  Make sure that you call for this appointment within a day or two after you arrive home to insure a convenient appointment time. OTHER INSTRUCTIONS:  _____________________________________________________________ _____________________________________________________________  WHEN TO CALL YOUR DOCTOR: 1. Fever over 101.0 2. Inability to urinate 3. Nausea and/or vomiting 4. Extreme swelling or bruising 5. Continued bleeding from incision. 6. Increased pain, redness, or drainage from the incision. 7. Difficulty swallowing or breathing 8. Muscle cramping or spasms. 9. Numbness or tingling in hands or feet or around lips.  The clinic staff is available to answer your questions during regular business hours.  Please dont hesitate to call and ask to speak to one of the nurses if you have concerns.  For further questions, please visit [email or web address]

## 2014-07-05 NOTE — Care Management Note (Signed)
Case Management Note  Patient Details  Name: [name] MRN: [id number] Date of Birth: 1971-01-06  Subjective/Objective:                    Action/Plan:   Expected Discharge Date:      07-05-14             Expected Discharge Plan:  Home/Self Care  In-House Referral:     Discharge planning Services     Post Acute Care Choice:    Choice offered to:     DME Arranged:    DME Agency:     HH Arranged:    [place] Agency:     Status of Service:  Completed, signed off  Medicare Important Message Given:    Date Medicare IM Given:    Medicare IM give by:    Date Additional Medicare IM Given:    Additional Medicare Important Message give by:     If discussed at [place] of Stay Meetings, dates discussed:  07-05-14   Additional Comments:  Marilu Favre, RN 07/05/2014, 8:04 AM

## 2014-07-05 NOTE — ED Notes (Signed)
Surgeon at bedside.  

## 2014-07-05 NOTE — Progress Notes (Signed)
Pt discharged to home. JP drain removed.  IV removed. Discharge instructions explained to pt.  Pt has no questions at time of discharge.  [place]ade Nurse, RN

## 2014-07-05 NOTE — Discharge Summary (Signed)
Physician Discharge Summary  Patient ID: [name] MRN: [id number] DOB/AGE: April 12, 1970 44 y.o.  Admit date: 06/30/2014 Discharge date: 07/05/2014  Admission Diagnoses:  Recurrent sigmoid diverticulitis  Discharge Diagnoses: Recurrent sigmoid diverticulitis  Active Problems:   Sigmoid diverticulitis   Discharged Condition: good  Hospital Course: Laparoscopic assisted sigmoid diverticulitis on 06/30/14.  Began passing flatus on POD#3.  BM on POD#5.  Transitioned to PO meds.  Ready for discharge today  Consults: None  Significant Diagnostic Studies: pathology - diverticulitis with abscess  Treatments: surgery: lap assisted sigmoid colectomy  Discharge Exam: Blood pressure 135/77, pulse 96, temperature 98.4 F (36.9 C), temperature source Oral, resp. rate 17, height 5\' 4"  (1.626 m), weight 85.999 kg (189 lb 9.5 oz), SpO2 100 %. General appearance: alert, cooperative and no distress GI: soft, non-tender; bowel sounds normal; no masses,  no organomegaly incisions c/d/i  Drain - serosanguinous - will remove prior to discharge  Disposition: 01-Home or Self Care  Discharge Instructions    Call MD for:  persistant nausea and vomiting    Complete by:  As directed      Call MD for:  redness, tenderness, or signs of infection (pain, swelling, redness, odor or green/yellow discharge around incision site)    Complete by:  As directed      Call MD for:  severe uncontrolled pain    Complete by:  As directed      Call MD for:  temperature >100.4    Complete by:  As directed      Diet general    Complete by:  As directed      Discharge wound care:    Complete by:  As directed   Dry dressing over drain site until it is healed. No dressing needed over staple lines.     Driving Restrictions    Complete by:  As directed   Do not drive while taking pain medications     Increase activity slowly    Complete by:  As directed      May shower / Bathe    Complete by:  As directed       May walk up steps    Complete by:  As directed             Medication List    TAKE these medications        Black Cohosh 20 MG Tabs  Take 40 mg by mouth daily.     Ciclopirox 0.77 % gel  Apply 1 application topically 2 (two) times daily.     ciprofloxacin 500 MG tablet  Commonly known as:  CIPRO  Take 1 tablet (500 mg total) by mouth 2 (two) times daily.     docusate sodium 100 MG capsule  Commonly known as:  COLACE  Take 1 capsule (100 mg total) by mouth at bedtime.     HAIR/SKIN/NAILS/BIOTIN Tabs  Take 2 tablets by mouth daily with breakfast.     HYDROcodone-acetaminophen 7.5-325 MG per tablet  Commonly known as:  NORCO  Take 0.5-2 tablets by mouth every 6 (six) hours as needed for severe pain.     ibuprofen 800 MG tablet  Commonly known as:  ADVIL,MOTRIN  Take 800 mg by mouth every 8 (eight) hours as needed for moderate pain.     lisinopril 10 MG tablet  Commonly known as:  PRINIVIL,ZESTRIL  Take 10 mg by mouth daily.     metroNIDAZOLE 500 MG tablet  Commonly known as:  FLAGYL  Take 1  tablet (500 mg total) by mouth 3 (three) times daily.     ondansetron 4 MG tablet  Commonly known as:  ZOFRAN  Take 1 tablet (4 mg total) by mouth every 6 (six) hours as needed for nausea.     oxyCODONE-acetaminophen 5-325 MG per tablet  Commonly known as:  PERCOCET/ROXICET  Take 1-2 tablets by mouth every 4 (four) hours as needed for moderate pain.     polyethylene glycol packet  Commonly known as:  MIRALAX / GLYCOLAX  Take 17 g by mouth daily as needed for moderate constipation.     promethazine 25 MG tablet  Commonly known as:  PHENERGAN  Take 1 tablet (25 mg total) by mouth every 6 (six) hours as needed for nausea or vomiting.     saccharomyces boulardii 250 MG capsule  Commonly known as:  FLORASTOR  Take 1 capsule (250 mg total) by mouth 2 (two) times daily.           Follow-up Information    Schedule an appointment as soon as possible for a visit with  Maia Petties., MD.   Specialty:  General Surgery   Why:  For suture removal   Contact information:   [place] [address] [phone]       Signed: Vernia Teem K. 07/05/2014, 7:01 AM

## 2014-07-05 NOTE — ED Notes (Addendum)
Pt st's just discharged from hosp earlier today.  St's tonight started to have elevated temp with pain in left lower abd and left flanik area.  Last took Motrin at 21:05

## 2014-07-05 NOTE — H&P (Signed)
Cale Pate-Tomerlin is an 44 y.o. female.   Chief Complaint: fever HPI: 30 yof discharged earlier today after passing flatus/bm tolerated regular diet after lap sigmoid colectomy for recurrent diverticulitis. She went home then began having back pain and a fever. Attempted some motrin but fever went to 102 degrees.  She has some increasing pain and came to er.   Past Medical History  Diagnosis Date  . History of syphilis   . Breast fibroadenoma   . History of ectopic pregnancy 2005    r salpyngectomy  . History of conization of cervix 2000  . H/O myomectomy 07-2006    robotic  . History of hysterectomy, supracervical 08-2008    robotic  . PMS (premenstrual syndrome)   . Back pain   . H/O sinusitis   . GERD (gastroesophageal reflux disease)   . Hx of herpes simplex type 2 infection   . History of syphilis   . Abnormal Pap smear 2000    Cone Biopsy  . H/O metrorrhagia 12/2006  . Hypertension   . Headache(784.0)   . Diverticulitis of colon with perforation 05/20/2014    Past Surgical History  Procedure Laterality Date  . Cervical cone biopsy  2000  . Ectopic pregnancy surgery  2005    R salpyngectomy  . Myomectomy  2008    robotic  . Robotic assisted lap vaginal hysterectomy  2010    supracervical  . Polypectomy  2009  . Salpingectomy  2005  . Dilation and curettage of uterus  2009  . Abdominal hysterectomy    . Breast surgery  2004    fibroademoma  . Breast lumpectomy  10/11    rt-neg  . Trigger finger release Right 09/28/2012    Procedure: RELEASE TRIGGER FINGER/A-1 PULLEY RIGHT THUMB;  Surgeon: Jolyn Nap, MD;  Location: [facility];  Service: Orthopedics;  Laterality: Right;  . Laparoscopic sigmoid colectomy N/A 06/30/2014    Procedure: LAPAROSCOPIC ASSISTED SIGMOID COLECTOMY;  Surgeon: Donnie Mesa, MD;  Location: [facility] OR;  Service: General;  Laterality: N/A;    Family History  Problem Relation Age of Onset  . Hypertension Paternal Grandmother   .  Diabetes Paternal Grandmother   . Hypertension Maternal Grandmother   . Sarcoidosis Mother   . Hypertension Mother   . Thyroid disease Maternal Aunt   . Mental illness Cousin    Social History:  reports that she quit smoking about 3 years ago. Her smoking use included Cigarettes. She has a 3.75 pack-year smoking history. She has never used smokeless tobacco. She reports that she drinks about 1.5 oz of alcohol per week. She reports that she does not use illicit drugs.  Allergies:  Allergies  Allergen Reactions  . Keflex [Cephalexin] Anaphylaxis  . Penicillins Anaphylaxis   meds reviewed  Results for orders placed or performed during the hospital encounter of 07/05/14 (from the past 48 hour(s))  CBC with Differential     Status: Abnormal   Collection Time: 07/05/14 10:00 PM  Result Value Ref Range   WBC 13.0 (H) 4.0 - 10.5 K/uL   RBC 4.60 3.87 - 5.11 MIL/uL   Hemoglobin 14.0 12.0 - 15.0 g/dL   HCT 40.9 36.0 - 46.0 %   MCV 88.9 78.0 - 100.0 fL   MCH 30.4 26.0 - 34.0 pg   MCHC 34.2 30.0 - 36.0 g/dL   RDW 12.8 11.5 - 15.5 %   Platelets 426 (H) 150 - 400 K/uL   Neutrophils Relative % 81 (H) 43 -  77 %   Neutro Abs 10.6 (H) 1.7 - 7.7 K/uL   Lymphocytes Relative 9 (L) 12 - 46 %   Lymphs Abs 1.2 0.7 - 4.0 K/uL   Monocytes Relative 8 3 - 12 %   Monocytes Absolute 1.0 0.1 - 1.0 K/uL   Eosinophils Relative 2 0 - 5 %   Eosinophils Absolute 0.3 0.0 - 0.7 K/uL   Basophils Relative 0 0 - 1 %   Basophils Absolute 0.0 0.0 - 0.1 K/uL  I-Stat Chem 8, ED     Status: Abnormal   Collection Time: 07/05/14 10:10 PM  Result Value Ref Range   Sodium 137 135 - 145 mmol/L   Potassium 3.9 3.5 - 5.1 mmol/L   Chloride 97 (L) 101 - 111 mmol/L   BUN <3 (L) 6 - 20 mg/dL   Creatinine, Ser 0.80 0.44 - 1.00 mg/dL   Glucose, Bld 165 (H) 65 - 99 mg/dL   Calcium, Ion 1.19 1.12 - 1.23 mmol/L   TCO2 23 0 - 100 mmol/L   Hemoglobin 15.0 12.0 - 15.0 g/dL   HCT 44.0 36.0 - 46.0 %  Urinalysis, Routine w reflex  microscopic     Status: Abnormal   Collection Time: 07/05/14 10:10 PM  Result Value Ref Range   Color, Urine YELLOW YELLOW   APPearance HAZY (A) CLEAR   Specific Gravity, Urine 1.015 1.005 - 1.030   pH 5.5 5.0 - 8.0   Glucose, UA NEGATIVE NEGATIVE mg/dL   Hgb urine dipstick NEGATIVE NEGATIVE   Bilirubin Urine NEGATIVE NEGATIVE   Ketones, ur NEGATIVE NEGATIVE mg/dL   Protein, ur NEGATIVE NEGATIVE mg/dL   Urobilinogen, UA 0.2 0.0 - 1.0 mg/dL   Nitrite NEGATIVE NEGATIVE   Leukocytes, UA NEGATIVE NEGATIVE    Comment: MICROSCOPIC NOT DONE ON URINES WITH NEGATIVE PROTEIN, BLOOD, LEUKOCYTES, NITRITE, OR GLUCOSE <1000 mg/dL.  Basic metabolic panel     Status: Abnormal   Collection Time: 07/05/14 10:25 PM  Result Value Ref Range   Sodium 136 135 - 145 mmol/L   Potassium 3.6 3.5 - 5.1 mmol/L   Chloride 97 (L) 101 - 111 mmol/L   CO2 28 22 - 32 mmol/L   Glucose, Bld 148 (H) 65 - 99 mg/dL   BUN <5 (L) 6 - 20 mg/dL   Creatinine, Ser 0.91 0.44 - 1.00 mg/dL   Calcium 9.5 8.9 - 10.3 mg/dL   GFR calc non Af Amer >60 >60 mL/min   GFR calc Af Amer >60 >60 mL/min    Comment: (NOTE) The eGFR has been calculated using the CKD EPI equation. This calculation has not been validated in all clinical situations. eGFR's persistently <60 mL/min signify possible Chronic Kidney Disease.    Anion gap 11 5 - 15   No results found.  Review of Systems  Constitutional: Positive for fever. Negative for chills.  Respiratory: Negative for cough.   Cardiovascular: Negative for chest pain.  Gastrointestinal: Positive for abdominal pain.  Musculoskeletal: Positive for back pain.    Blood pressure 158/82, pulse 122, temperature 100.3 F (37.9 C), temperature source Oral, resp. rate 19, height _0  (1.626 m), weight 85.322 kg (188 lb 1.6 oz), SpO2 97 %. Physical Exam  Vitals reviewed. Constitutional: She appears well-developed and well-nourished.  Cardiovascular: Regular rhythm.  Tachycardia present.     Respiratory: Effort normal and breath sounds normal.  GI: Soft. She exhibits no distension. Bowel sounds are decreased. There is tenderness in the left lower quadrant.  Assessment/Plan Fever s/p colectomy  Ct scan rule out abscess/leak Admission, iv abx  Wendy Mikles 07/05/2014, 11:11 PM

## 2014-07-05 NOTE — ED Provider Notes (Signed)
CSN: [id number]     Arrival date & time 07/05/14  2138 History   First MD Initiated Contact with Patient 07/05/14 2204     Chief Complaint  Patient presents with  . Fever     (Consider location/radiation/quality/duration/timing/severity/associated sxs/prior Treatment) HPI Comments: Patient is a 44 year old female who is 5 days s/p laparoscopic sigmoid colectomy (for recurrent diverticulitis with associated perforation) who presents with fever and abdominal pain that started today after she was discharged from the hospital. Symptoms started gradually and progressively worsened since the onset. The pain is aching and severe without radiation. She reports associated subjective fever at home with unknown Tmax. She took motrin for fever and pain without relief. Palpation and movement makes the pain worse. No alleviating factors. No other associated symptoms.    Past Medical History  Diagnosis Date  . History of syphilis   . Breast fibroadenoma   . History of ectopic pregnancy 2005    r salpyngectomy  . History of conization of cervix 2000  . H/O myomectomy 07-2006    robotic  . History of hysterectomy, supracervical 08-2008    robotic  . PMS (premenstrual syndrome)   . Back pain   . H/O sinusitis   . GERD (gastroesophageal reflux disease)   . Hx of herpes simplex type 2 infection   . History of syphilis   . Abnormal Pap smear 2000    [facility] Biopsy  . H/O metrorrhagia 12/2006  . Hypertension   . Headache(784.0)   . Diverticulitis of colon with perforation 05/20/2014   Past Surgical History  Procedure Laterality Date  . Cervical cone biopsy  2000  . Ectopic pregnancy surgery  2005    R salpyngectomy  . Myomectomy  2008    robotic  . Robotic assisted lap vaginal hysterectomy  2010    supracervical  . Polypectomy  2009  . Salpingectomy  2005  . Dilation and curettage of uterus  2009  . Abdominal hysterectomy    . Breast surgery  2004    fibroademoma  . Breast lumpectomy  10/11     rt-neg  . Trigger finger release Right 09/28/2012    Procedure: RELEASE TRIGGER FINGER/A-1 PULLEY RIGHT THUMB;  Surgeon: Jolyn Nap, MD;  Location: [place];  Service: Orthopedics;  Laterality: Right;  . Laparoscopic sigmoid colectomy N/A 06/30/2014    Procedure: LAPAROSCOPIC ASSISTED SIGMOID COLECTOMY;  Surgeon: Donnie Mesa, MD;  Location: [facility];  Service: General;  Laterality: N/A;   Family History  Problem Relation Age of Onset  . Hypertension Paternal Grandmother   . Diabetes Paternal Grandmother   . Hypertension Maternal Grandmother   . Sarcoidosis Mother   . Hypertension Mother   . Thyroid disease Maternal Aunt   . Mental illness Cousin    History  Substance Use Topics  . Smoking status: Former Smoker -- 0.25 packs/day for 15 years    Types: Cigarettes    Quit date: 06/12/2011  . Smokeless tobacco: Never Used  . Alcohol Use: 1.5 oz/week    3 Standard drinks or equivalent per week   OB History    Gravida Para Term Preterm AB TAB SAB Ectopic Multiple Living   4 2 2  2 1  0 1 0 2     Review of Systems  Constitutional: Positive for fever. Negative for chills and fatigue.  HENT: Negative for trouble swallowing.   Eyes: Negative for visual disturbance.  Respiratory: Negative for shortness of breath.   Cardiovascular: Negative  for chest pain and palpitations.  Gastrointestinal: Positive for abdominal pain. Negative for nausea, vomiting and diarrhea.  Genitourinary: Negative for dysuria and difficulty urinating.  Musculoskeletal: Negative for arthralgias and neck pain.  Skin: Negative for color change.  Neurological: Negative for dizziness and weakness.  Psychiatric/Behavioral: Negative for dysphoric mood.      Allergies  Keflex and Penicillins  Home Medications   Prior to Admission medications   Medication Sig Start Date End Date Taking? Authorizing Provider  Black Cohosh 20 MG TABS Take 40 mg by mouth daily.    Historical Provider, MD   Ciclopirox 0.77 % gel Apply 1 application topically 2 (two) times daily. 04/26/14   Historical Provider, MD  ciprofloxacin (CIPRO) 500 MG tablet Take 1 tablet (500 mg total) by mouth 2 (two) times daily. Patient not taking: Reported on 06/17/2014 05/24/14   Megan N Dort, PA-C  docusate sodium (COLACE) 100 MG capsule Take 1 capsule (100 mg total) by mouth at bedtime. Patient not taking: Reported on 06/17/2014 02/06/14   Ripudeep Krystal Eaton, MD  HYDROcodone-acetaminophen (NORCO) 7.5-325 MG per tablet Take 0.5-2 tablets by mouth every 6 (six) hours as needed for severe pain. Patient not taking: Reported on 06/17/2014 05/24/14   Megan N Dort, PA-C  ibuprofen (ADVIL,MOTRIN) 800 MG tablet Take 800 mg by mouth every 8 (eight) hours as needed for moderate pain.    Historical Provider, MD  lisinopril (PRINIVIL,ZESTRIL) 10 MG tablet Take 10 mg by mouth daily.    Historical Provider, MD  metroNIDAZOLE (FLAGYL) 500 MG tablet Take 1 tablet (500 mg total) by mouth 3 (three) times daily. Patient not taking: Reported on 06/17/2014 05/24/14   Megan N Dort, PA-C  Multiple Vitamins-Minerals (HAIR/SKIN/NAILS/BIOTIN) TABS Take 2 tablets by mouth daily with breakfast.    Historical Provider, MD  ondansetron (ZOFRAN) 4 MG tablet Take 1 tablet (4 mg total) by mouth every 6 (six) hours as needed for nausea. 11/05/13   Nita Sells, MD  oxyCODONE-acetaminophen (PERCOCET/ROXICET) 5-325 MG per tablet Take 1-2 tablets by mouth every 4 (four) hours as needed for moderate pain. 07/05/14   Donnie Mesa, MD  polyethylene glycol (MIRALAX / Floria Raveling) packet Take 17 g by mouth daily as needed for moderate constipation. Patient not taking: Reported on 06/17/2014 05/24/14   Nehemiah Massed Dort, PA-C  promethazine (PHENERGAN) 25 MG tablet Take 1 tablet (25 mg total) by mouth every 6 (six) hours as needed for nausea or vomiting. 02/06/14   Ripudeep Krystal Eaton, MD  saccharomyces boulardii (FLORASTOR) 250 MG capsule Take 1 capsule (250 mg total) by mouth 2 (two)  times daily. Patient taking differently: Take 250 mg by mouth every other day.  02/06/14   Ripudeep K Rai, MD   BP 154/82 mmHg  Pulse 140  Temp(Src) 100.6 F (38.1 C) (Oral)  Resp 16  Ht 5\' 4"  (1.626 m)  Wt 188 lb 1.6 oz (85.322 kg)  BMI 32.27 kg/m2  SpO2 95% Physical Exam  Constitutional: She is oriented to person, place, and time. She appears well-developed and well-nourished. No distress.  HENT:  Head: Normocephalic and atraumatic.  Eyes: Conjunctivae and EOM are normal.  Neck: Normal range of motion.  Cardiovascular: Regular rhythm.  Exam reveals no gallop and no friction rub.   No murmur heard. tachycardic  Pulmonary/Chest: Effort normal and breath sounds normal. She has no wheezes. She has no rales. She exhibits no tenderness.  Abdominal: Soft. She exhibits no distension. There is tenderness. There is no rebound.  8cm central, vertical  abdominal incision with staples intact without drainage or surrounding edema. LLQ tenderness to palpation. No other focal tenderness or peritoneal signs.   Musculoskeletal: Normal range of motion.  Neurological: She is alert and oriented to person, place, and time. Coordination normal.  Speech is goal-oriented. Moves limbs without ataxia.   Skin: Skin is warm and dry.  See abdominal   Psychiatric: She has a normal mood and affect. Her behavior is normal.  Nursing note and vitals reviewed.   ED Course  Procedures (including critical care time) Labs Review Labs Reviewed  CBC WITH DIFFERENTIAL/PLATELET - Abnormal; Notable for the following:    WBC 13.0 (*)    Platelets 426 (*)    Neutrophils Relative % 81 (*)    Neutro Abs 10.6 (*)    Lymphocytes Relative 9 (*)    All other components within normal limits  URINALYSIS, ROUTINE W REFLEX MICROSCOPIC - Abnormal; Notable for the following:    APPearance HAZY (*)    All other components within normal limits  I-STAT CHEM 8, ED - Abnormal; Notable for the following:    Chloride 97 (*)    BUN  <3 (*)    Glucose, Bld 165 (*)    All other components within normal limits   CRITICAL CARE Performed by: Alvina Chou   Total critical care time: 1 hour  Critical care time was exclusive of separately billable procedures and treating other patients.  Critical care was necessary to treat or prevent imminent or life-threatening deterioration.  Critical care was time spent personally by me on the following activities: development of treatment plan with patient and/or surrogate as well as nursing, discussions with consultants, evaluation of patient's response to treatment, examination of patient, obtaining history from patient or surrogate, ordering and performing treatments and interventions, ordering and review of laboratory studies, ordering and review of radiographic studies, pulse oximetry and re-evaluation of patient's condition.   Imaging Review Ct Abdomen Pelvis W Contrast  07/06/2014   CLINICAL DATA:  Increasing left lower quadrant pain, fever and chills. Post sigmoid colectomy six days prior, discharged earlier today, increasing pain.  EXAM: CT ABDOMEN AND PELVIS WITH CONTRAST  TECHNIQUE: Multidetector CT imaging of the abdomen and pelvis was performed using the standard protocol following bolus administration of intravenous contrast.  CONTRAST:  114mL OMNIPAQUE IOHEXOL 300 MG/ML  SOLN  COMPARISON:  CT 05/20/2014  FINDINGS: Linear atelectasis in the right and left lower lobes.  Post sigmoid colectomy with enteric chain sutures in the sigmoid colon. There is a moderate volume of extraluminal air adjacent to the surgical bed, tracking in the retroperitoneum anterior to the psoas muscle. Only minimal fluid component is seen within the site, with minimal high-density material, likely complex fluid, medially. There is extraluminal air in the adjacent mesentery, with additional multi focal foci of free air in the anterior abdomen, upper abdomen, and presacral space. There is soft tissue  induration at the surgical site. No extraluminal oral contrast is seen, however oral contrast is passes only to the cecum. There is a moderate volume of colonic stool. No drainable abscess.  Mild focal fatty infiltration adjacent with falciform ligament. Liver is otherwise unremarkable. The gallbladder, spleen, pancreas, and adrenal glands are normal. There is symmetric renal enhancement and excretion. No hydronephrosis or localizing abnormality.  The abdominal aorta is normal in caliber with minimal atherosclerosis. There is a retroperitoneal air. No retroperitoneal adenopathy.  Within the pelvis the urinary bladder is physiologically distended. Patient is post hysterectomy. No adnexal mass. There  is no pelvic adenopathy. No deep pelvic fluid collection. Presacral air and induration is noted.  Postsurgical change in the anterior abdominal wall with midline skin staples. No fluid collection. Scattered  There are no acute or suspicious osseous abnormalities. Degenerative change in the lumbar spine with primarily facet arthropathy.  IMPRESSION: Post recent sigmoid colon resection, with a moderate amount of free intra-abdominal air. This is greater than expected 6 days postoperative, and concerning for leak. There is no drainable fluid collection, the largest air pocket has a small complex fluid component at the surgical be, and is immediately adjacent to the left iliac vasculature. Additional free air seen tracking in the retroperitoneum, mesenteric, upper abdomen, and presacral space.  These results were called by telephone at the time of interpretation on 07/06/2014 at 2:29 am to [place] , who verbally acknowledged these results.   Electronically Signed   By: Jeb Levering M.D.   On: 07/06/2014 02:30     EKG Interpretation   Date/Time:  Tuesday Jul 05 2014 22:17:57 EDT Ventricular Rate:  129 PR Interval:  119 QRS Duration: 78 QT Interval:  303 QTC Calculation: 444 R Axis:   68 Text  Interpretation:  Sinus tachycardia LAE, consider biatrial enlargement  RSR' in V1 or V2, probably normal variant Baseline wander in lead(s) I II  V1 Since last tracing rate faster Confirmed by [facility]  MD, ELLIOTT ([id number])  on 07/05/2014 10:28:01 PM      MDM   Final diagnoses:  Peritoneal free air  Fever, unspecified fever cause  Left lower quadrant pain    10:58 PM Patient is febrile and tachycardic. Blood cultures drawn. Elevated WBC at 13. CT abdomen pelvis pending.   11:14 PM Patient meets sepsis criteria. I spoke with Dr. Donne Hazel who advises not to start antibiotics at this time-we will wait for the CT result.   2:32 AM Patient has free peritoneal air that tracks to the retroperitoneal space concerning for leak at the anastomosis. Patient will be admitted to Dr. Donne Hazel.   Alvina Chou, PA-C 07/06/14 0234  Daleen Bo, MD 07/06/14 [phone]

## 2014-07-06 ENCOUNTER — Observation Stay (HOSPITAL_COMMUNITY): Payer: BLUE CROSS/BLUE SHIELD | Admitting: Certified Registered Nurse Anesthetist

## 2014-07-06 ENCOUNTER — Emergency Department (HOSPITAL_COMMUNITY): Payer: BLUE CROSS/BLUE SHIELD

## 2014-07-06 ENCOUNTER — Encounter (HOSPITAL_COMMUNITY)
Admission: EM | Disposition: A | Discharge status: Home / Self Care | Payer: Self-pay | Source: Home / Self Care | Attending: Surgery

## 2014-07-06 ENCOUNTER — Encounter (HOSPITAL_COMMUNITY): Payer: Self-pay | Admitting: General Practice

## 2014-07-06 DIAGNOSIS — I10 Essential (primary) hypertension: Secondary | ICD-10-CM | POA: Diagnosis present

## 2014-07-06 DIAGNOSIS — R509 Fever, unspecified: Secondary | ICD-10-CM | POA: Diagnosis present

## 2014-07-06 DIAGNOSIS — Z881 Allergy status to other antibiotic agents status: Secondary | ICD-10-CM | POA: Diagnosis not present

## 2014-07-06 DIAGNOSIS — K219 Gastro-esophageal reflux disease without esophagitis: Secondary | ICD-10-CM | POA: Diagnosis present

## 2014-07-06 DIAGNOSIS — Z79899 Other long term (current) drug therapy: Secondary | ICD-10-CM | POA: Diagnosis not present

## 2014-07-06 DIAGNOSIS — Z791 Long term (current) use of non-steroidal anti-inflammatories (NSAID): Secondary | ICD-10-CM | POA: Diagnosis not present

## 2014-07-06 DIAGNOSIS — Z880 Allergy status to penicillin: Secondary | ICD-10-CM | POA: Diagnosis not present

## 2014-07-06 DIAGNOSIS — Z90710 Acquired absence of both cervix and uterus: Secondary | ICD-10-CM | POA: Diagnosis not present

## 2014-07-06 DIAGNOSIS — Z9049 Acquired absence of other specified parts of digestive tract: Secondary | ICD-10-CM

## 2014-07-06 DIAGNOSIS — K668 Other specified disorders of peritoneum: Secondary | ICD-10-CM | POA: Diagnosis present

## 2014-07-06 DIAGNOSIS — Y832 Surgical operation with anastomosis, bypass or graft as the cause of abnormal reaction of the patient, or of later complication, without mention of misadventure at the time of the procedure: Secondary | ICD-10-CM | POA: Diagnosis present

## 2014-07-06 DIAGNOSIS — K9189 Other postprocedural complications and disorders of digestive system: Secondary | ICD-10-CM | POA: Diagnosis present

## 2014-07-06 DIAGNOSIS — Z87891 Personal history of nicotine dependence: Secondary | ICD-10-CM | POA: Diagnosis not present

## 2014-07-06 HISTORY — PX: COLON RESECTION: SHX5231

## 2014-07-06 LAB — CBC
HCT: 38.4 % (ref 36.0–46.0)
HEMATOCRIT: 39.5 % (ref 36.0–46.0)
HEMOGLOBIN: 13.3 g/dL (ref 12.0–15.0)
Hemoglobin: 12.5 g/dL (ref 12.0–15.0)
MCH: 29.3 pg (ref 26.0–34.0)
MCH: 30.1 pg (ref 26.0–34.0)
MCHC: 32.6 g/dL (ref 30.0–36.0)
MCHC: 33.7 g/dL (ref 30.0–36.0)
MCV: 89.9 fL (ref 78.0–100.0)
MCV: 89.4 fL (ref 78.0–100.0)
PLATELETS: 393 10*3/uL (ref 150–400)
Platelets: 357 10*3/uL (ref 150–400)
RBC: 4.27 MIL/uL (ref 3.87–5.11)
RBC: 4.42 MIL/uL (ref 3.87–5.11)
RDW: 12.8 % (ref 11.5–15.5)
RDW: 12.9 % (ref 11.5–15.5)
WBC: 14.3 10*3/uL — ABNORMAL HIGH (ref 4.0–10.5)
WBC: 14.9 10*3/uL — AB (ref 4.0–10.5)

## 2014-07-06 LAB — BASIC METABOLIC PANEL
Anion gap: 9 (ref 5–15)
CHLORIDE: 100 mmol/L — AB (ref 101–111)
CO2: 30 mmol/L (ref 22–32)
CREATININE: 0.85 mg/dL (ref 0.44–1.00)
Calcium: 9.6 mg/dL (ref 8.9–10.3)
GFR calc non Af Amer: >60 mL/min (ref >60)
Glucose, Bld: 112 mg/dL — ABNORMAL HIGH (ref 65–99)
Potassium: 4.1 mmol/L (ref 3.5–5.1)
SODIUM: 139 mmol/L (ref 135–145)

## 2014-07-06 SURGERY — COLON RESECTION LAPAROSCOPIC
Anesthesia: General | Site: Abdomen

## 2014-07-06 MED ORDER — BUPIVACAINE-EPINEPHRINE 0.25% -1:200000 IJ SOLN
INTRAMUSCULAR | Status: DC | PRN
  Administered 2014-07-06: 30 mL

## 2014-07-06 MED ORDER — ONDANSETRON HCL 4 MG/2ML IJ SOLN: 4.0000 mg | Freq: Once | INTRAMUSCULAR | Status: DC | PRN

## 2014-07-06 MED ORDER — MIDAZOLAM HCL 5 MG/5ML IJ SOLN
INTRAMUSCULAR | Status: DC | PRN
  Administered 2014-07-06: 2 mg via INTRAVENOUS

## 2014-07-06 MED ORDER — NEOSTIGMINE METHYLSULFATE 10 MG/10ML IV SOLN
INTRAVENOUS | Status: DC | PRN
  Administered 2014-07-06: 4 mg via INTRAVENOUS

## 2014-07-06 MED ORDER — ONDANSETRON HCL 4 MG/2ML IJ SOLN: 4.0000 mg | Freq: Three times a day (TID) | INTRAMUSCULAR | Status: AC | PRN

## 2014-07-06 MED ORDER — LIDOCAINE HCL (CARDIAC) 20 MG/ML IV SOLN
INTRAVENOUS | Status: DC | PRN
  Administered 2014-07-06: 40 mg via INTRAVENOUS

## 2014-07-06 MED ORDER — ONDANSETRON HCL 4 MG/2ML IJ SOLN
INTRAMUSCULAR | Status: AC
  Filled 2014-07-06: qty 2

## 2014-07-06 MED ORDER — SODIUM CHLORIDE 0.9 % IR SOLN
Status: DC | PRN
  Administered 2014-07-06: 1000 mL

## 2014-07-06 MED ORDER — LISINOPRIL 10 MG PO TABS
10.0000 mg | ORAL_TABLET | Freq: Every day | ORAL | Status: DC
  Administered 2014-07-06 – 2014-07-11 (×6): 10 mg via ORAL
  Filled 2014-07-06 (×6): qty 1

## 2014-07-06 MED ORDER — FENTANYL CITRATE (PF) 250 MCG/5ML IJ SOLN
INTRAMUSCULAR | Status: AC
  Filled 2014-07-06: qty 5

## 2014-07-06 MED ORDER — ROCURONIUM BROMIDE 50 MG/5ML IV SOLN
INTRAVENOUS | Status: AC
  Filled 2014-07-06: qty 1

## 2014-07-06 MED ORDER — PANTOPRAZOLE SODIUM 40 MG IV SOLR
40.0000 mg | Freq: Every day | INTRAVENOUS | Status: DC
  Administered 2014-07-06 – 2014-07-09 (×4): 40 mg via INTRAVENOUS
  Filled 2014-07-06 (×4): qty 40

## 2014-07-06 MED ORDER — ACETAMINOPHEN 325 MG PO TABS
650.0000 mg | ORAL_TABLET | Freq: Four times a day (QID) | ORAL | Status: DC | PRN
  Administered 2014-07-06: 650 mg via ORAL
  Filled 2014-07-06: qty 2

## 2014-07-06 MED ORDER — HYDROMORPHONE HCL 1 MG/ML IJ SOLN
1.0000 mg | Freq: Once | INTRAMUSCULAR | Status: AC
  Administered 2014-07-06: 1 mg via INTRAVENOUS
  Filled 2014-07-06: qty 1

## 2014-07-06 MED ORDER — HEPARIN SODIUM (PORCINE) 5000 UNIT/ML IJ SOLN
5000.0000 [IU] | Freq: Three times a day (TID) | INTRAMUSCULAR | Status: DC
Start: 2014-07-06 — End: 2014-07-11
  Administered 2014-07-06 – 2014-07-11 (×14): 5000 [IU] via SUBCUTANEOUS
  Filled 2014-07-06 (×14): qty 1

## 2014-07-06 MED ORDER — SUCCINYLCHOLINE CHLORIDE 20 MG/ML IJ SOLN
INTRAMUSCULAR | Status: DC | PRN
  Administered 2014-07-06: 120 mg via INTRAVENOUS

## 2014-07-06 MED ORDER — FENTANYL CITRATE (PF) 100 MCG/2ML IJ SOLN
INTRAMUSCULAR | Status: DC | PRN
  Administered 2014-07-06 (×5): 50 ug via INTRAVENOUS

## 2014-07-06 MED ORDER — FENTANYL CITRATE (PF) 100 MCG/2ML IJ SOLN
INTRAMUSCULAR | Status: DC
Start: 2014-07-06 — End: 2014-07-06
  Filled 2014-07-06: qty 2

## 2014-07-06 MED ORDER — ONDANSETRON HCL 4 MG/2ML IJ SOLN
4.0000 mg | Freq: Four times a day (QID) | INTRAMUSCULAR | Status: DC | PRN
  Administered 2014-07-06: 4 mg via INTRAVENOUS

## 2014-07-06 MED ORDER — ROCURONIUM BROMIDE 100 MG/10ML IV SOLN
INTRAVENOUS | Status: DC | PRN
  Administered 2014-07-06: 40 mg via INTRAVENOUS

## 2014-07-06 MED ORDER — HYDROMORPHONE HCL 1 MG/ML IJ SOLN: 1.0000 mg | INTRAMUSCULAR | Status: DC | PRN

## 2014-07-06 MED ORDER — FENTANYL CITRATE (PF) 100 MCG/2ML IJ SOLN
INTRAMUSCULAR | Status: AC
  Filled 2014-07-06: qty 2

## 2014-07-06 MED ORDER — SODIUM CHLORIDE 0.9 % IV SOLN
INTRAVENOUS | Status: DC
  Administered 2014-07-06: 04:00:00 via INTRAVENOUS

## 2014-07-06 MED ORDER — MORPHINE SULFATE 2 MG/ML IJ SOLN: 2.0000 mg | INTRAMUSCULAR | Status: DC | PRN

## 2014-07-06 MED ORDER — GLYCOPYRROLATE 0.2 MG/ML IJ SOLN
INTRAMUSCULAR | Status: DC | PRN
  Administered 2014-07-06: 0.6 mg via INTRAVENOUS

## 2014-07-06 MED ORDER — LACTATED RINGERS IV SOLN
INTRAVENOUS | Status: DC
  Administered 2014-07-06 (×2): via INTRAVENOUS

## 2014-07-06 MED ORDER — SODIUM CHLORIDE 0.9 % IV SOLN
INTRAVENOUS | Status: DC
Start: 2014-07-06 — End: 2014-07-09
  Administered 2014-07-06 – 2014-07-08 (×4): via INTRAVENOUS

## 2014-07-06 MED ORDER — METRONIDAZOLE IN NACL 5-0.79 MG/ML-% IV SOLN
500.0000 mg | Freq: Three times a day (TID) | INTRAVENOUS | Status: DC
  Administered 2014-07-06 – 2014-07-10 (×13): 500 mg via INTRAVENOUS
  Filled 2014-07-06 (×15): qty 100

## 2014-07-06 MED ORDER — 0.9 % SODIUM CHLORIDE (POUR BTL) OPTIME
TOPICAL | Status: DC | PRN
  Administered 2014-07-06: 1000 mL

## 2014-07-06 MED ORDER — BUPIVACAINE-EPINEPHRINE (PF) 0.25% -1:200000 IJ SOLN
INTRAMUSCULAR | Status: AC
  Filled 2014-07-06: qty 30

## 2014-07-06 MED ORDER — MIDAZOLAM HCL 2 MG/2ML IJ SOLN
INTRAMUSCULAR | Status: AC
  Filled 2014-07-06: qty 2

## 2014-07-06 MED ORDER — FENTANYL CITRATE (PF) 100 MCG/2ML IJ SOLN
25.0000 ug | INTRAMUSCULAR | Status: DC | PRN
  Administered 2014-07-06: 50 ug via INTRAVENOUS
  Administered 2014-07-06: 25 ug via INTRAVENOUS
  Administered 2014-07-06 (×2): 50 ug via INTRAVENOUS
  Administered 2014-07-06: 25 ug via INTRAVENOUS

## 2014-07-06 MED ORDER — IOHEXOL 300 MG/ML  SOLN
100.0000 mL | Freq: Once | INTRAMUSCULAR | Status: AC | PRN
  Administered 2014-07-06: 100 mL via INTRAVENOUS

## 2014-07-06 MED ORDER — ACETAMINOPHEN 650 MG RE SUPP: 650.0000 mg | Freq: Four times a day (QID) | RECTAL | Status: DC | PRN

## 2014-07-06 MED ORDER — HYDROMORPHONE HCL 1 MG/ML IJ SOLN
1.0000 mg | INTRAMUSCULAR | Status: DC | PRN
  Administered 2014-07-06 – 2014-07-11 (×26): 1 mg via INTRAVENOUS
  Filled 2014-07-06 (×26): qty 1

## 2014-07-06 MED ORDER — PROPOFOL 10 MG/ML IV BOLUS
INTRAVENOUS | Status: DC | PRN
  Administered 2014-07-06: 150 mg via INTRAVENOUS

## 2014-07-06 MED ORDER — LIDOCAINE HCL (CARDIAC) 20 MG/ML IV SOLN
INTRAVENOUS | Status: AC
  Filled 2014-07-06: qty 5

## 2014-07-06 MED ORDER — CIPROFLOXACIN IN D5W 400 MG/200ML IV SOLN
400.0000 mg | Freq: Two times a day (BID) | INTRAVENOUS | Status: DC
  Administered 2014-07-06 – 2014-07-10 (×9): 400 mg via INTRAVENOUS
  Filled 2014-07-06 (×10): qty 200

## 2014-07-06 SURGICAL SUPPLY — 65 items
APPLIER CLIP ROT 10 11.4 M/L (STAPLE)
APR CLP MED LRG 11.4X10 (STAPLE)
CANISTER SUCTION 2500CC (MISCELLANEOUS) ×3 IMPLANT
CATH ROBINSON RED A/P 14FR (CATHETERS) ×3 IMPLANT
CHLORAPREP W/TINT 26ML (MISCELLANEOUS) ×3 IMPLANT
CLIP APPLIE ROT 10 11.4 M/L (STAPLE) IMPLANT
COVER MAYO STAND STRL (DRAPES) ×3 IMPLANT
COVER SURGICAL LIGHT HANDLE (MISCELLANEOUS) ×3 IMPLANT
DRAIN CHANNEL 19F RND (DRAIN) ×3 IMPLANT
DRAPE LAPAROSCOPIC ABDOMINAL (DRAPES) ×3 IMPLANT
DRAPE PROXIMA HALF (DRAPES) IMPLANT
DRAPE UTILITY XL STRL (DRAPES) ×15 IMPLANT
DRSG OPSITE POSTOP 4X10 (GAUZE/BANDAGES/DRESSINGS) IMPLANT
DRSG OPSITE POSTOP 4X8 (GAUZE/BANDAGES/DRESSINGS) IMPLANT
ELECT BLADE 6.5 EXT (BLADE) ×3 IMPLANT
ELECT CAUTERY BLADE 6.4 (BLADE) ×3 IMPLANT
ELECT REM PT RETURN 9FT ADLT (ELECTROSURGICAL) ×3
ELECTRODE REM PT RTRN 9FT ADLT (ELECTROSURGICAL) ×1 IMPLANT
EVACUATOR SILICONE 100CC (DRAIN) ×3 IMPLANT
GAUZE SPONGE 2X2 8PLY STRL LF (GAUZE/BANDAGES/DRESSINGS) ×1 IMPLANT
GLOVE BIO SURGEON STRL SZ7 (GLOVE) ×12 IMPLANT
GLOVE BIO SURGEON STRL SZ8 (GLOVE) ×6 IMPLANT
GLOVE BIOGEL PI IND STRL 6.5 (GLOVE) ×1 IMPLANT
GLOVE BIOGEL PI IND STRL 7.5 (GLOVE) ×3 IMPLANT
GLOVE BIOGEL PI IND STRL 8 (GLOVE) ×1 IMPLANT
GLOVE BIOGEL PI INDICATOR 6.5 (GLOVE) ×2
GLOVE BIOGEL PI INDICATOR 7.5 (GLOVE) ×6
GLOVE BIOGEL PI INDICATOR 8 (GLOVE) ×2
GLOVE ECLIPSE 7.5 STRL STRAW (GLOVE) ×3 IMPLANT
GLOVE SURG SS PI 6.5 STRL IVOR (GLOVE) ×3 IMPLANT
GOWN STRL REUS W/ TWL LRG LVL3 (GOWN DISPOSABLE) ×3 IMPLANT
GOWN STRL REUS W/ TWL XL LVL3 (GOWN DISPOSABLE) ×1 IMPLANT
GOWN STRL REUS W/TWL LRG LVL3 (GOWN DISPOSABLE) ×9
GOWN STRL REUS W/TWL XL LVL3 (GOWN DISPOSABLE) ×2
KIT BASIN OR (CUSTOM PROCEDURE TRAY) ×3 IMPLANT
KIT OSTOMY DRAINABLE 2.75 STR (WOUND CARE) ×3 IMPLANT
KIT ROOM TURNOVER OR (KITS) ×3 IMPLANT
NS IRRIG 1000ML POUR BTL (IV SOLUTION) ×6 IMPLANT
PAD ARMBOARD 7.5X6 YLW CONV (MISCELLANEOUS) ×6 IMPLANT
PENCIL BUTTON HOLSTER BLD 10FT (ELECTRODE) ×6 IMPLANT
SCALPEL HARMONIC ACE (MISCELLANEOUS) ×3 IMPLANT
SCISSORS LAP 5X35 DISP (ENDOMECHANICALS) ×3 IMPLANT
SET IRRIG TUBING LAPAROSCOPIC (IRRIGATION / IRRIGATOR) ×3 IMPLANT
SLEEVE ENDOPATH XCEL 5M (ENDOMECHANICALS) ×6 IMPLANT
SPECIMEN JAR LARGE (MISCELLANEOUS) ×3 IMPLANT
SPONGE GAUZE 2X2 STER 10/PKG (GAUZE/BANDAGES/DRESSINGS) ×2
SPONGE GAUZE 4X4 12PLY STER LF (GAUZE/BANDAGES/DRESSINGS) ×3 IMPLANT
STAPLER VISISTAT 35W (STAPLE) ×6 IMPLANT
SUT ETHILON 2 0 FS 18 (SUTURE) ×6 IMPLANT
SUT ETHILON 3 0 FSL (SUTURE) ×3 IMPLANT
SUT SILK 2 0 SH CR/8 (SUTURE) ×3 IMPLANT
SUT SILK 2 0 TIES 10X30 (SUTURE) ×3 IMPLANT
SUT SILK 3 0 SH CR/8 (SUTURE) ×3 IMPLANT
SUT SILK 3 0 TIES 10X30 (SUTURE) ×3 IMPLANT
SUT VIC AB 3-0 SH 18 (SUTURE) ×3 IMPLANT
SYR BULB 3OZ (MISCELLANEOUS) ×3 IMPLANT
SYR BULB IRRIGATION 50ML (SYRINGE) ×3 IMPLANT
TOWEL OR 17X26 10 PK STRL BLUE (TOWEL DISPOSABLE) ×6 IMPLANT
TRAY FOLEY CATH 16FRSI W/METER (SET/KITS/TRAYS/PACK) ×3 IMPLANT
TRAY LAPAROSCOPIC (CUSTOM PROCEDURE TRAY) ×3 IMPLANT
TROCAR XCEL NON-BLD 5MMX100MML (ENDOMECHANICALS) ×3 IMPLANT
TUBE CONNECTING 12'X1/4 (SUCTIONS) ×2
TUBE CONNECTING 12X1/4 (SUCTIONS) ×4 IMPLANT
TUBING INSUFFLATION (TUBING) ×3 IMPLANT
YANKAUER SUCT BULB TIP NO VENT (SUCTIONS) ×3 IMPLANT

## 2014-07-06 NOTE — Anesthesia Postprocedure Evaluation (Signed)
  Anesthesia Post-op Note  Patient: [name]  Procedure(s) Performed: Procedure(s): LAPAROSCOPIC LOOP ILEOSTOMY WITH PLACEMENT OF PELVIC DRAIN (N/A)  Patient Location: PACU  Anesthesia Type:General  Level of Consciousness: awake, alert  and oriented  Airway and Oxygen Therapy: Patient Spontanous Breathing and Patient connected to nasal cannula oxygen  Post-op Pain: mild  Post-op Assessment: Post-op Vital signs reviewed, Patient's Cardiovascular Status Stable, Respiratory Function Stable, Patent Airway and Pain level controlled  Post-op Vital Signs: stable  Last Vitals:  Filed Vitals:   07/06/14 1633  BP: 143/60  Pulse: 112  Temp: 38.1 C  Resp: 14    Complications: No apparent anesthesia complications

## 2014-07-06 NOTE — ED Notes (Signed)
Patient has been up to the [facility] frequently to void.  Urine clear and no odor.

## 2014-07-06 NOTE — ED Notes (Signed)
Unable to give report at this time.

## 2014-07-06 NOTE — ED Notes (Signed)
Patient taken to CT and returned.  Needs to be a 2 hour drinker due to recent surgery

## 2014-07-06 NOTE — Progress Notes (Signed)
Subjective: Patient more comfortable Has been afebrile over the last few hours I reviewed the CT scan - there appears to be some areas of extraluminal free air behind the anastomosis tracking up the retroperitoneum.  No large abscess.  Objective: Vital signs in last 24 hours: Temp:  [98.6 F (37 C)-100.6 F (38.1 C)] 98.6 F (37 C) (05/18 0703) Pulse Rate:  [97-140] 97 (05/18 0703) Resp:  [13-24] 16 (05/18 0703) BP: (115-158)/(66-89) 123/66 mmHg (05/18 0703) SpO2:  [92 %-98 %] 98 % (05/18 0703) Weight:  [85.322 kg (188 lb 1.6 oz)-85.8 kg (189 lb 2.5 oz)] 85.8 kg (189 lb 2.5 oz) (05/18 0330) Last BM Date: 07/05/14  Intake/Output from previous day: 05/17 0701 - 05/18 0700 In: 1110.4 [P.O.:650; I.V.:160.4; IV Piggyback:300] Out: 0  Intake/Output this shift:    General appearance: alert, cooperative and no distress GI: Hypoactive bowel sounds; LLQ tenderness Incisions c/d/i  Lab Results:   Recent Labs  07/05/14 2200 07/05/14 2210 07/06/14 0410  WBC 13.0*  --  14.3*  HGB 14.0 15.0 12.5  HCT 40.9 44.0 38.4  PLT 426*  --  393   BMET  Recent Labs  07/05/14 2225 07/06/14 0410  NA 136 139  [name] 3.6 4.1  CL 97* 100*  CO2 28 30  GLUCOSE 148* 112*  BUN <5* <5*  CREATININE 0.91 0.85  CALCIUM 9.5 9.6   PT/INR No results for input(s): LABPROT, INR in the last 72 hours. ABG No results for input(s): PHART, HCO3 in the last 72 hours.  Invalid input(s): PCO2, PO2  Studies/Results: Ct Abdomen Pelvis W Contrast  07/06/2014   CLINICAL DATA:  Increasing left lower quadrant pain, fever and chills. Post sigmoid colectomy six days prior, discharged earlier today, increasing pain.  EXAM: CT ABDOMEN AND PELVIS WITH CONTRAST  TECHNIQUE: Multidetector CT imaging of the abdomen and pelvis was performed using the standard protocol following bolus administration of intravenous contrast.  CONTRAST:  154mL OMNIPAQUE IOHEXOL 300 MG/ML  SOLN  COMPARISON:  CT 05/20/2014  FINDINGS: Linear  atelectasis in the right and left lower lobes.  Post sigmoid colectomy with enteric chain sutures in the sigmoid colon. There is a moderate volume of extraluminal air adjacent to the surgical bed, tracking in the retroperitoneum anterior to the psoas muscle. Only minimal fluid component is seen within the site, with minimal high-density material, likely complex fluid, medially. There is extraluminal air in the adjacent mesentery, with additional multi focal foci of free air in the anterior abdomen, upper abdomen, and presacral space. There is soft tissue induration at the surgical site. No extraluminal oral contrast is seen, however oral contrast is passes only to the cecum. There is a moderate volume of colonic stool. No drainable abscess.  Mild focal fatty infiltration adjacent with falciform ligament. Liver is otherwise unremarkable. The gallbladder, spleen, pancreas, and adrenal glands are normal. There is symmetric renal enhancement and excretion. No hydronephrosis or localizing abnormality.  The abdominal aorta is normal in caliber with minimal atherosclerosis. There is a retroperitoneal air. No retroperitoneal adenopathy.  Within the pelvis the urinary bladder is physiologically distended. Patient is post hysterectomy. No adnexal mass. There is no pelvic adenopathy. No deep pelvic fluid collection. Presacral air and induration is noted.  Postsurgical change in the anterior abdominal wall with midline skin staples. No fluid collection. Scattered  There are no acute or suspicious osseous abnormalities. Degenerative change in the lumbar spine with primarily facet arthropathy.  IMPRESSION: Post recent sigmoid colon resection, with a moderate amount  of free intra-abdominal air. This is greater than expected 6 days postoperative, and concerning for leak. There is no drainable fluid collection, the largest air pocket has a small complex fluid component at the surgical be, and is immediately adjacent to the left  iliac vasculature. Additional free air seen tracking in the retroperitoneum, mesenteric, upper abdomen, and presacral space.  These results were called by telephone at the time of interpretation on 07/06/2014 at 2:29 am to [place] , who verbally acknowledged these results.   Electronically Signed   By: Jeb Levering M.D.   On: 07/06/2014 02:30    Anti-infectives: Anti-infectives    Start     Dose/Rate Route Frequency Ordered Stop   07/06/14 0400  ciprofloxacin (CIPRO) IVPB 400 mg     400 mg 200 mL/hr over 60 Minutes Intravenous 2 times daily 07/06/14 0321     07/06/14 0400  metroNIDAZOLE (FLAGYL) IVPB 500 mg     500 mg 100 mL/hr over 60 Minutes Intravenous 3 times per day 07/06/14 0321        Assessment/Plan: S/p laparoscopic sigmoid colectomy for recurrent diverticulitis Now with apparent anastomotic leak at colorectal anastomosis Plan OR today - laparoscopic placement of pelvic drain/ washout/ loop ileostomy  I discussed this with the patient and explained the situation.  She is obviously disappointed but seems to understand the need for this procedure.  We will add this on the OR schedule today.  The surgical procedure has been discussed with the patient.  Potential risks, benefits, alternative treatments, and expected outcomes have been explained.  All of the patient's questions at this time have been answered.  The likelihood of reaching the patient's treatment goal is good.  The patient understand the proposed surgical procedure and wishes to proceed.   LOS: 0 days    [name]. 07/06/2014

## 2014-07-06 NOTE — Anesthesia Preprocedure Evaluation (Addendum)
Anesthesia Evaluation  Patient identified by MRN, date of birth, ID band Patient awake    Reviewed: Allergy & Precautions, H&P , NPO status , Patient's Chart, lab work & pertinent test results  History of Anesthesia Complications Negative for: history of anesthetic complications  Airway Mallampati: II  TM Distance: >3 FB Neck ROM: full    Dental   Pulmonary neg pulmonary ROS, Current Smoker, former smoker,  breath sounds clear to auscultation        Cardiovascular hypertension, Pt. on medications negative cardio ROS  Rhythm:regular     Neuro/Psych  Headaches, PSYCHIATRIC DISORDERS Depression    GI/Hepatic Neg liver ROS, GERD-  Medicated,  Endo/Other  negative endocrine ROS  Renal/GU ARFRenal disease     Musculoskeletal   Abdominal   Peds  Hematology negative hematology ROS (+)   Anesthesia Other Findings See surgeon's H&P   Reproductive/Obstetrics negative OB ROS                          Anesthesia Physical Anesthesia Plan  ASA: III and emergent  Anesthesia Plan: General   Post-op Pain Management:    Induction: Intravenous  Airway Management Planned: Oral ETT  Additional Equipment:   Intra-op Plan:   Post-operative Plan:   Informed Consent: I have reviewed the patients History and Physical, chart, labs and discussed the procedure including the risks, benefits and alternatives for the proposed anesthesia with the patient or authorized representative who has indicated his/her understanding and acceptance.   Dental advisory given  Plan Discussed with: CRNA and Anesthesiologist  Anesthesia Plan Comments:         Anesthesia Quick Evaluation

## 2014-07-06 NOTE — Progress Notes (Signed)
Patient is febrile, encouraged incentive and medication given as ordered, Dr. Brantley Stage on call MD was made aware

## 2014-07-06 NOTE — Transfer of Care (Signed)
Immediate Anesthesia Transfer of Care Note  Patient: [name]  Procedure(s) Performed: Procedure(s): LAPAROSCOPIC LOOP ILEOSTOMY WITH PLACEMENT OF PELVIC DRAIN (N/A)  Patient Location: PACU  Anesthesia Type:General  Level of Consciousness: awake, alert , oriented and patient cooperative  Airway & Oxygen Therapy: Patient Spontanous Breathing and Patient connected to nasal cannula oxygen  Post-op Assessment: Report given to RN, Post -op Vital signs reviewed and stable and Patient moving all extremities X 4  Post vital signs: Reviewed and stable  Complications: No apparent anesthesia complications

## 2014-07-06 NOTE — ED Notes (Signed)
Dressings replaced to abd incisions.  Staples intact to upper and mid abd area, RLQ - no drainage noted and edges well approximated.  Drain site to left upper quad no drainage notes.  All dressing replaced.

## 2014-07-06 NOTE — Op Note (Signed)
Preop diagnosis: Colorectal anastomotic leak Postop diagnosis: Same Procedure performed: Laparoscopic placement of pelvic drain and laparoscopic loop ileostomy Surgeon:Cleatus Gabriel K. Asst.: Dr. Georganna Skeans Anesthesia: Gen. endotracheal Indications: This is a 44 year old female who is one-week status post laparoscopic assisted sigmoid colectomy with end-to-end EEA colorectal anastomosis. She had a drain placed at the time of surgery because of an air leak when testing. We did place additional sutures and subsequent airleak was negative. She was discharged home yesterday with stable vital signs normal white count and no purulence coming from her drainage. She was having bowel movements. Her drain was removed prior to discharge yesterday. However shortly after getting home she began having some fever up to 102 and increasing pelvic pain. She presented back to the MRSA department where she was noted to have an elevated white blood cell count. CT scan showed areas of free air behind the anastomosis tracking up in the retroperitoneum. There is no abscess to be drained. I discussed the situation with the patient and recommended laparoscopic washout with placement of a drain and creation of a loop ileostomy to divert stool stream.  Description of procedure: The patient brought to the operating room and placed in supine position operative table. After an adequate level of general anesthesia was obtained, the patient's abdomen was prepped with ChloraPrep and draped sterile fashion. A timeout was taken to ensure the proper patient and proper procedure. A Foley catheter had previously been placed. We removed all the staples from her previous incisions. A 5 mm Optiview trocar was used to cannulate the peritoneal cavity in the right upper quadrant through an old incision. Pneumoperitoneum was obtained insufflating CO2 maintaining a maximum pressure of 15 mmHg. The laparoscope was inserted. The patient has some loose  omental adhesions to anterior abdominal wall. Another 5 mm port was placed in the right lower quadrant. Another 5 mm port was placed in the upper midline in the area of a previous port site. We took down the adhesions bluntly. There is no gross stool a purulence in the abdomen. We put the patient Trendelenburg position and examined the pelvis carefully. We could visualize anterior surface of the anastomosis identified approximately silk sutures. There is a very small amount of purulent fluid in this area. There is no stool noted. We washed this area thoroughly with the suction irrigator. A 19 French round drain was brought in to the abdomen and brought out the left lateral abdominal wall in the area of her previous drain. The drain was placed down into the pelvis and tucked behind the left side of the anastomosis. This was secured with 2-0 nylon at the skin. We then identified the terminal ileum and cecum. We went retrograde by about 20 cm and selected a loop of small bowel. This seemed to reach the right upper quadrant port site without much tension. We removed the port site in the right upper quadrant an enlarged incision. We removed some the subcutaneous fat. We incised the fascia and entered the peritoneal cavity. I dilated this opening up to 2 fingers. We then exteriorized the small bowel to this area. A red rubber catheter was placed under the loop and was secured to the skin with 3-0 nylon suture. We examined the abdomen again. No further bleeding was noted. The drain was confirmed to be in the proper location in the pelvis tucked behind the anastomosis. Pneumoperitoneum was then released as removed the trochars. The skin incisions were all irrigated and staples were used to close the skin.  The loop ileostomy was then matured with 3-0 Vicryl sutures. An ostomy appliance was applied. The patient was then next made about the recovery room in stable condition. All sponge, instrument, and needle counts are  correct.  Imogene Burn. Georgette Dover, MD, [facility]  07/06/2014 2:24 PM

## 2014-07-07 ENCOUNTER — Encounter (HOSPITAL_COMMUNITY): Payer: Self-pay | Admitting: Surgery

## 2014-07-07 LAB — CBC
HCT: 34.7 % — ABNORMAL LOW (ref 36.0–46.0)
Hemoglobin: 11.6 g/dL — ABNORMAL LOW (ref 12.0–15.0)
MCH: 30.4 pg (ref 26.0–34.0)
MCHC: 33.4 g/dL (ref 30.0–36.0)
MCV: 91.1 fL (ref 78.0–100.0)
PLATELETS: 375 10*3/uL (ref 150–400)
RBC: 3.81 MIL/uL — ABNORMAL LOW (ref 3.87–5.11)
RDW: 13.2 % (ref 11.5–15.5)
WBC: 17.1 10*3/uL — ABNORMAL HIGH (ref 4.0–10.5)

## 2014-07-07 LAB — BASIC METABOLIC PANEL
ANION GAP: 8 (ref 5–15)
BUN: <5 mg/dL — ABNORMAL LOW (ref 6–20)
CHLORIDE: 99 mmol/L — AB (ref 101–111)
CO2: 28 mmol/L (ref 22–32)
Calcium: 8.2 mg/dL — ABNORMAL LOW (ref 8.9–10.3)
Creatinine, Ser: 0.97 mg/dL (ref 0.44–1.00)
Glucose, Bld: 119 mg/dL — ABNORMAL HIGH (ref 65–99)
Potassium: 3.8 mmol/L (ref 3.5–5.1)
Sodium: 135 mmol/L (ref 135–145)

## 2014-07-07 MED ORDER — OXYCODONE-ACETAMINOPHEN 5-325 MG PO TABS
1.0000 | ORAL_TABLET | ORAL | Status: DC | PRN
  Administered 2014-07-07 – 2014-07-11 (×11): 1 via ORAL
  Filled 2014-07-07 (×12): qty 1

## 2014-07-07 NOTE — Progress Notes (Signed)
1 Day Post-Op  Subjective: Patient bloated, belching.  No nausea or vomiting Was febrile last night, but has been afebrile today. Pain moderately well-controlled   Objective: Vital signs in last 24 hours: Temp:  [97 F (36.1 C)-101.4 F (38.6 C)] 98.6 F (37 C) (05/19 0523) Pulse Rate:  [99-122] 99 (05/19 0523) Resp:  [13-21] 16 (05/19 0523) BP: (100-151)/(52-88) 106/61 mmHg (05/19 1012) SpO2:  [96 %-100 %] 100 % (05/19 0523) Last BM Date: 07/05/14  Intake/Output from previous day: 05/18 0701 - 05/19 0700 In: 2500 [I.V.:2500] Out: 1430 [Urine:1275; Drains:140; Blood:15] Intake/Output this shift:    General appearance: alert, cooperative and no distress GI: distended; hypoactive bowel sounds; drain with serosanguinous output Right-sided loop ileostomy - thin serosanguinous output ; viable  Lab Results:   Recent Labs  07/06/14 1705 07/07/14 0550  WBC 14.9* 17.1*  HGB 13.3 11.6*  HCT 39.5 34.7*  PLT 357 375   BMET  Recent Labs  07/06/14 0410 07/07/14 0550  NA 139 135  K 4.1 3.8  CL 100* 99*  CO2 30 28  GLUCOSE 112* 119*  BUN <5* <5*  CREATININE 0.85 0.97  CALCIUM 9.6 8.2*   PT/INR No results for input(s): LABPROT, INR in the last 72 hours. ABG No results for input(s): PHART, HCO3 in the last 72 hours.  Invalid input(s): PCO2, PO2  Studies/Results: Ct Abdomen Pelvis W Contrast  07/06/2014   CLINICAL DATA:  Increasing left lower quadrant pain, fever and chills. Post sigmoid colectomy six days prior, discharged earlier today, increasing pain.  EXAM: CT ABDOMEN AND PELVIS WITH CONTRAST  TECHNIQUE: Multidetector CT imaging of the abdomen and pelvis was performed using the standard protocol following bolus administration of intravenous contrast.  CONTRAST:  180mL OMNIPAQUE IOHEXOL 300 MG/ML  SOLN  COMPARISON:  CT 05/20/2014  FINDINGS: Linear atelectasis in the right and left lower lobes.  Post sigmoid colectomy with enteric chain sutures in the sigmoid colon.  There is a moderate volume of extraluminal air adjacent to the surgical bed, tracking in the retroperitoneum anterior to the psoas muscle. Only minimal fluid component is seen within the site, with minimal high-density material, likely complex fluid, medially. There is extraluminal air in the adjacent mesentery, with additional multi focal foci of free air in the anterior abdomen, upper abdomen, and presacral space. There is soft tissue induration at the surgical site. No extraluminal oral contrast is seen, however oral contrast is passes only to the cecum. There is a moderate volume of colonic stool. No drainable abscess.  Mild focal fatty infiltration adjacent with falciform ligament. Liver is otherwise unremarkable. The gallbladder, spleen, pancreas, and adrenal glands are normal. There is symmetric renal enhancement and excretion. No hydronephrosis or localizing abnormality.  The abdominal aorta is normal in caliber with minimal atherosclerosis. There is a retroperitoneal air. No retroperitoneal adenopathy.  Within the pelvis the urinary bladder is physiologically distended. Patient is post hysterectomy. No adnexal mass. There is no pelvic adenopathy. No deep pelvic fluid collection. Presacral air and induration is noted.  Postsurgical change in the anterior abdominal wall with midline skin staples. No fluid collection. Scattered  There are no acute or suspicious osseous abnormalities. Degenerative change in the lumbar spine with primarily facet arthropathy.  IMPRESSION: Post recent sigmoid colon resection, with a moderate amount of free intra-abdominal air. This is greater than expected 6 days postoperative, and concerning for leak. There is no drainable fluid collection, the largest air pocket has a small complex fluid component at the surgical  be, and is immediately adjacent to the left iliac vasculature. Additional free air seen tracking in the retroperitoneum, mesenteric, upper abdomen, and presacral space.   These results were called by telephone at the time of interpretation on 07/06/2014 at 2:29 am to [place] , who verbally acknowledged these results.   Electronically Signed   By: Jeb Levering M.D.   On: 07/06/2014 02:30    Anti-infectives: Anti-infectives    Start     Dose/Rate Route Frequency Ordered Stop   07/06/14 0400  ciprofloxacin (CIPRO) IVPB 400 mg     400 mg 200 mL/hr over 60 Minutes Intravenous 2 times daily 07/06/14 0321     07/06/14 0400  metroNIDAZOLE (FLAGYL) IVPB 500 mg     500 mg 100 mL/hr over 60 Minutes Intravenous 3 times per day 07/06/14 0321        Assessment/Plan: s/p Procedure(s): LAPAROSCOPIC LOOP ILEOSTOMY WITH PLACEMENT OF PELVIC DRAIN (N/A) Awaiting bowel function - continue clears  Anastomotic leak - continue abx/ drain Encourage ambulation.   LOS: 1 day    Annette Lonon K. 07/07/2014

## 2014-07-07 NOTE — Care Management Note (Signed)
Case Management Note  Patient Details  Name: Ane Conerly MRN: [id number] Date of Birth: Jul 17, 1970  Subjective/Objective:                    Action/Plan:   Expected Discharge Date:       07-11-14           Expected Discharge Plan:  [place]  In-House Referral:     Discharge planning Services  CM Consult  Post Acute Care Choice:  Home Health Choice offered to:  Patient  DME Arranged:    DME Agency:     HH Arranged:  RN [place] Agency:  [place]  Status of Service:  In process, will continue to follow  Medicare Important Message Given:    Date Medicare IM Given:    Medicare IM give by:    Date Additional Medicare IM Given:    Additional Medicare Important Message give by:     If discussed at [employer] of Stay Meetings, dates discussed:    Additional Comments:  Marilu Favre, RN 07/07/2014, 11:23 AM

## 2014-07-07 NOTE — Consult Note (Addendum)
WOC ostomy consult note Stoma type/location:  Consult requested for ileostomy; pt had surgery yesterday to RLQ Stomal assessment/size: Stoma red and viable when viewed through ostomy pouch, which is intact with good seal.  Stoma appears to be flush with skin level. Output No stool or flatus at this time Ostomy pouching: 2pc.  Education provided: Will begin pouch change demonstrations and education tomorrow, since this is the first post-op day. Supplies ordered to bedside for staff nurse use. Julien Girt MSN, RN, [place], [place], [place]

## 2014-07-08 LAB — BASIC METABOLIC PANEL
Anion gap: 7 (ref 5–15)
CALCIUM: 8.2 mg/dL — AB (ref 8.9–10.3)
CO2: 26 mmol/L (ref 22–32)
Chloride: 103 mmol/L (ref 101–111)
Creatinine, Ser: 0.91 mg/dL (ref 0.44–1.00)
GFR calc Af Amer: >60 mL/min (ref >60)
GFR calc non Af Amer: >60 mL/min (ref >60)
GLUCOSE: 111 mg/dL — AB (ref 65–99)
Potassium: 3.6 mmol/L (ref 3.5–5.1)
Sodium: 136 mmol/L (ref 135–145)

## 2014-07-08 LAB — CBC
HCT: 32.4 % — ABNORMAL LOW (ref 36.0–46.0)
Hemoglobin: 10.4 g/dL — ABNORMAL LOW (ref 12.0–15.0)
MCH: 29.5 pg (ref 26.0–34.0)
MCHC: 32.1 g/dL (ref 30.0–36.0)
MCV: 91.8 fL (ref 78.0–100.0)
Platelets: 366 10*3/uL (ref 150–400)
RBC: 3.53 MIL/uL — ABNORMAL LOW (ref 3.87–5.11)
RDW: 13.5 % (ref 11.5–15.5)
WBC: 15.7 10*3/uL — ABNORMAL HIGH (ref 4.0–10.5)

## 2014-07-08 NOTE — Consult Note (Addendum)
WOC ostomy follow up Pt had ileostomy surgery to RLQ on 5/18. Stoma type/location:  Stoma red and viable, 1 1/4 inches, flush with skin level.  Red rubber rod sutured in place underneath stoma. Peristomal assessment:  Intact skin surrounding Output: 50cc dark brown liquid stool Ostomy pouching: 2pc.  Education provided:  Demonstrated pouch change.  Pt asked appropriate questions and was able to open and close the velcro to empty.  Discussed pouch change frequency and ordering supplies. Pt will need rod D/Ced at [place] office visit. She could benefit from home health assistance after discharge.  Educational materials left at bedside and extra supplies in room for bedside nurse use. Enrolled patient in [place] Start Discharge program: Yes Julien Girt MSN, RN, [place], [place], [place]

## 2014-07-08 NOTE — Progress Notes (Signed)
2 Days Post-Op  Subjective: Sore, but using minimal pain medication Ileostomy with good output  Objective: Vital signs in last 24 hours: Temp:  [98.7 F (37.1 C)-100.9 F (38.3 C)] 100.9 F (38.3 C) (05/20 0447) Pulse Rate:  [106-113] 112 (05/20 0447) Resp:  [16-17] 16 (05/20 0447) BP: (106-127)/(29-65) 127/63 mmHg (05/20 0447) SpO2:  [97 %-100 %] 97 % (05/20 0447) Last BM Date: 07/08/14  Intake/Output from previous day: 05/19 0701 - 05/20 0700 In: 3191.7 [P.O.:740; I.V.:2351.7; IV Piggyback:100] Out: 2770 [Urine:2350; Drains:120; Stool:300] Intake/Output this shift: Total I/O In: 2451.7 [I.V.:2351.7; IV Piggyback:100] Out: 1350 [Urine:1150; Drains:50; Stool:150]  General appearance: alert, cooperative and no distress GI: soft, tender around incisions; drain with serosanguinous ouput Ileostomy - pink; brownish output  Lab Results:   Recent Labs  07/07/14 0550 07/08/14 0414  WBC 17.1* 15.7*  HGB 11.6* 10.4*  HCT 34.7* 32.4*  PLT 375 366   BMET  Recent Labs  07/07/14 0550 07/08/14 0414  NA 135 136  K 3.8 3.6  CL 99* 103  CO2 28 26  GLUCOSE 119* 111*  BUN <5* PENDING  CREATININE 0.97 0.91  CALCIUM 8.2* 8.2*   PT/INR No results for input(s): LABPROT, INR in the last 72 hours. ABG No results for input(s): PHART, HCO3 in the last 72 hours.  Invalid input(s): PCO2, PO2  Studies/Results: No results found.  Anti-infectives: Anti-infectives    Start     Dose/Rate Route Frequency Ordered Stop   07/06/14 0400  ciprofloxacin (CIPRO) IVPB 400 mg     400 mg 200 mL/hr over 60 Minutes Intravenous 2 times daily 07/06/14 0321     07/06/14 0400  metroNIDAZOLE (FLAGYL) IVPB 500 mg     500 mg 100 mL/hr over 60 Minutes Intravenous 3 times per day 07/06/14 0321        Assessment/Plan: s/p Procedure(s): LAPAROSCOPIC LOOP ILEOSTOMY WITH PLACEMENT OF PELVIC DRAIN (N/A) Advance diet  Continue abx Probable discharge Monday  LOS: 2 days    [name]  K. 07/08/2014

## 2014-07-09 MED ORDER — KCL IN DEXTROSE-NACL 20-5-0.45 MEQ/L-%-% IV SOLN
INTRAVENOUS | Status: DC
  Administered 2014-07-09 – 2014-07-10 (×3): via INTRAVENOUS
  Filled 2014-07-09 (×5): qty 1000

## 2014-07-09 MED ORDER — ENSURE ENLIVE PO LIQD
237.0000 mL | Freq: Two times a day (BID) | ORAL | Status: DC
Start: 2014-07-09 — End: 2014-07-11
  Administered 2014-07-09 – 2014-07-11 (×5): 237 mL via ORAL

## 2014-07-09 NOTE — Progress Notes (Signed)
3 Days Post-Op  Subjective: Not hungry tol clears pain controlled   Objective: Vital signs in last 24 hours: Temp:  [98.7 F (37.1 C)-99.6 F (37.6 C)] 99 F (37.2 C) (05/21 0502) Pulse Rate:  [101-103] 101 (05/21 0502) Resp:  [17-18] 17 (05/21 0502) BP: (128-136)/(56-70) 128/56 mmHg (05/21 0502) SpO2:  [96 %-100 %] 96 % (05/21 0502) Last BM Date: 07/08/14  Intake/Output from previous day: 05/20 0701 - 05/21 0700 In: 2665 [P.O.:240; I.V.:2025; IV Piggyback:400] Out: 350 [Drains:50; Stool:300] Intake/Output this shift:    General appearance: fatigued Resp: clear to auscultation bilaterally Cardio: regular rate and rhythm GI: approp tender stoma pink bs present drain serous  Lab Results:   Recent Labs  07/07/14 0550 07/08/14 0414  WBC 17.1* 15.7*  HGB 11.6* 10.4*  HCT 34.7* 32.4*  PLT 375 366   BMET  Recent Labs  07/07/14 0550 07/08/14 0414  NA 135 136  K 3.8 3.6  CL 99* 103  CO2 28 26  GLUCOSE 119* 111*  BUN <5* <5*  CREATININE 0.97 0.91  CALCIUM 8.2* 8.2*   PT/INR No results for input(s): LABPROT, INR in the last 72 hours. ABG No results for input(s): PHART, HCO3 in the last 72 hours.  Invalid input(s): PCO2, PO2  Studies/Results: No results found.  Anti-infectives: Anti-infectives    Start     Dose/Rate Route Frequency Ordered Stop   07/06/14 0400  ciprofloxacin (CIPRO) IVPB 400 mg     400 mg 200 mL/hr over 60 Minutes Intravenous 2 times daily 07/06/14 0321     07/06/14 0400  metroNIDAZOLE (FLAGYL) IVPB 500 mg     500 mg 100 mL/hr over 60 Minutes Intravenous 3 times per day 07/06/14 0321        Assessment/Plan: POD 3 diverting ileostomy/drain for anastomotic leak s/p lap sigmoid  1. Po pain meds, with iv backup 2. pulm toilet, ambulate 3. Cont soft diet, add ensure 4. Cont cipro/flagyl 5. Heparin sq scds 6. Dc telemetry  [facility] 07/09/2014

## 2014-07-10 LAB — BASIC METABOLIC PANEL
ANION GAP: 7 (ref 5–15)
BUN: <5 mg/dL — ABNORMAL LOW (ref 6–20)
CO2: 28 mmol/L (ref 22–32)
Calcium: 8.8 mg/dL — ABNORMAL LOW (ref 8.9–10.3)
Chloride: 102 mmol/L (ref 101–111)
Creatinine, Ser: 0.65 mg/dL (ref 0.44–1.00)
GFR calc Af Amer: >60 mL/min (ref >60)
GFR calc non Af Amer: >60 mL/min (ref >60)
GLUCOSE: 118 mg/dL — AB (ref 65–99)
Potassium: 3.5 mmol/L (ref 3.5–5.1)
Sodium: 137 mmol/L (ref 135–145)

## 2014-07-10 LAB — CBC
HCT: 31.5 % — ABNORMAL LOW (ref 36.0–46.0)
Hemoglobin: 10.2 g/dL — ABNORMAL LOW (ref 12.0–15.0)
MCH: 29.5 pg (ref 26.0–34.0)
MCHC: 32.4 g/dL (ref 30.0–36.0)
MCV: 91 fL (ref 78.0–100.0)
PLATELETS: 438 10*3/uL — AB (ref 150–400)
RBC: 3.46 MIL/uL — ABNORMAL LOW (ref 3.87–5.11)
RDW: 13.4 % (ref 11.5–15.5)
WBC: 12.2 10*3/uL — ABNORMAL HIGH (ref 4.0–10.5)

## 2014-07-10 MED ORDER — CIPROFLOXACIN HCL 500 MG PO TABS
500.0000 mg | ORAL_TABLET | Freq: Two times a day (BID) | ORAL | Status: DC
  Administered 2014-07-10 – 2014-07-11 (×2): 500 mg via ORAL
  Filled 2014-07-10 (×2): qty 1

## 2014-07-10 MED ORDER — PANTOPRAZOLE SODIUM 40 MG PO TBEC
40.0000 mg | DELAYED_RELEASE_TABLET | Freq: Every day | ORAL | Status: DC
  Administered 2014-07-10: 40 mg via ORAL
  Filled 2014-07-10: qty 1

## 2014-07-10 MED ORDER — METRONIDAZOLE 500 MG PO TABS
500.0000 mg | ORAL_TABLET | Freq: Three times a day (TID) | ORAL | Status: DC
  Administered 2014-07-10 – 2014-07-11 (×3): 500 mg via ORAL
  Filled 2014-07-10 (×3): qty 1

## 2014-07-10 NOTE — Progress Notes (Signed)
4 Days Post-Op  Subjective: tol diet fine, no real complaints  Objective: Vital signs in last 24 hours: Temp:  [98.2 F (36.8 C)-99.3 F (37.4 C)] 98.6 F (37 C) (05/22 0624) Pulse Rate:  [87-94] 93 (05/22 0624) Resp:  [16-18] 16 (05/22 0624) BP: (135-153)/(72-73) 135/73 mmHg (05/22 0624) SpO2:  [99 %-100 %] 100 % (05/22 0624) Last BM Date: 07/09/14  Intake/Output from previous day: 05/21 0701 - 05/22 0700 In: [place] [P.O.:600; I.V.:505; IV Piggyback:500] Out: 160 [Drains:10; Stool:150] Intake/Output this shift:    General appearance: no distress Resp: clear to auscultation bilaterally Cardio: regular rate and rhythm GI: incisions clean, bs present stoma pink and functional approp tender drain serous  Lab Results:   Recent Labs  07/08/14 0414 07/10/14 0501  WBC 15.7* 12.2*  HGB 10.4* 10.2*  HCT 32.4* 31.5*  PLT 366 438*   BMET  Recent Labs  07/08/14 0414 07/10/14 0501  NA 136 137  K 3.6 3.5  CL 103 102  CO2 26 28  GLUCOSE 111* 118*  BUN <5* <5*  CREATININE 0.91 0.65  CALCIUM 8.2* 8.8*   PT/INR No results for input(s): LABPROT, INR in the last 72 hours. ABG No results for input(s): PHART, HCO3 in the last 72 hours.  Invalid input(s): PCO2, PO2  Studies/Results: No results found.  Anti-infectives: Anti-infectives    Start     Dose/Rate Route Frequency Ordered Stop   07/10/14 2000  ciprofloxacin (CIPRO) tablet 500 mg     500 mg Oral 2 times daily 07/10/14 0822     07/10/14 1400  metroNIDAZOLE (FLAGYL) tablet 500 mg     500 mg Oral 3 times per day 07/10/14 0821     07/06/14 0400  ciprofloxacin (CIPRO) IVPB 400 mg  Status:  Discontinued     400 mg 200 mL/hr over 60 Minutes Intravenous 2 times daily 07/06/14 0321 07/10/14 0821   07/06/14 0400  metroNIDAZOLE (FLAGYL) IVPB 500 mg  Status:  Discontinued     500 mg 100 mL/hr over 60 Minutes Intravenous 3 times per day 07/06/14 0321 07/10/14 0821      Assessment/Plan: POD 5 diverting ileostomy/drain  for anastomotic leak s/p lap sigmoid  1. Po pain meds, with iv backup 2. pulm toilet, ambulate 3. Cont soft diet, ensure 4. Cont cipro/flagyl 5. Heparin sq scds 6.probably home tomorrow  [facility] 07/10/2014

## 2014-07-11 MED ORDER — CIPROFLOXACIN HCL 500 MG PO TABS: 500.0000 mg | ORAL_TABLET | Freq: Two times a day (BID) | ORAL | Status: DC

## 2014-07-11 MED ORDER — METRONIDAZOLE 500 MG PO TABS: 500.0000 mg | ORAL_TABLET | Freq: Three times a day (TID) | ORAL | Status: DC

## 2014-07-11 MED ORDER — OXYCODONE-ACETAMINOPHEN 5-325 MG PO TABS: 1.0000 | ORAL_TABLET | ORAL | Status: DC | PRN

## 2014-07-11 NOTE — Progress Notes (Signed)
AVS discharge instructions were reviewed with patient. Patient was given prescription for percocet and information on how to drain JP. Patient stated that she did not have any questions. Volunteers assisted to transportation.

## 2014-07-11 NOTE — Discharge Instructions (Signed)
Home Health nursing for ostomy care. No special dressing needed for staple lines - will remove staples in the office Record drain output and bring this chart with you to the office. Keep drain to bulb suction.   [facility], [place] [phone]  OPEN ABDOMINAL SURGERY: POST OP INSTRUCTIONS  Always review your discharge instruction sheet given to you by the facility where your surgery was performed.  IF YOU HAVE DISABILITY OR FAMILY LEAVE FORMS, YOU MUST BRING THEM TO THE OFFICE FOR PROCESSING.  PLEASE DO NOT GIVE THEM TO YOUR DOCTOR.  1. A prescription for pain medication may be given to you upon discharge.  Take your pain medication as prescribed, if needed.  If narcotic pain medicine is not needed, then you may take acetaminophen (Tylenol) or ibuprofen (Advil) as needed. 2. Take your usually prescribed medications unless otherwise directed. 3. If you need a refill on your pain medication, please contact your pharmacy. They will contact our office to request authorization.  Prescriptions will not be filled after 5pm or on week-ends. 4. You should follow a light diet the first few days after arrival home, such as soup and crackers, pudding, etc.unless your doctor has advised otherwise. A high-fiber, low fat diet can be resumed as tolerated.   Be sure to include lots of fluids daily. Most patients will experience some swelling and bruising on the chest and neck area.  Ice packs will help.  Swelling and bruising can take several days to resolve 5. Most patients will experience some swelling and bruising in the area of the incision. Ice pack will help. Swelling and bruising can take several days to resolve..  6.   Any sutures or staples will be removed at the office during your follow-up visit. You may find that a light gauze bandage over your incision may keep your staples from being rubbed or pulled. You may shower and replace the bandage daily. 7. ACTIVITIES:  You may resume  regular (light) daily activities beginning the next day--such as daily self-care, walking, climbing stairs--gradually increasing activities as tolerated.  You may have sexual intercourse when it is comfortable.  Refrain from any heavy lifting or straining until approved by your doctor. a. You may drive when you no longer are taking prescription pain medication, you can comfortably wear a seatbelt, and you can safely maneuver your car and apply brakes b. Return to Work: ___________________________________ 8. You should see your doctor in the office for a follow-up appointment approximately two weeks after your surgery.  Make sure that you call for this appointment within a day or two after you arrive home to insure a convenient appointment time. OTHER INSTRUCTIONS:  _____________________________________________________________ _____________________________________________________________  WHEN TO CALL YOUR DOCTOR: 1. Fever over 101.0 2. Inability to urinate 3. Nausea and/or vomiting 4. Extreme swelling or bruising 5. Continued bleeding from incision. 6. Increased pain, redness, or drainage from the incision. 7. Difficulty swallowing or breathing 8. Muscle cramping or spasms. 9. Numbness or tingling in hands or feet or around lips.  The clinic staff is available to answer your questions during regular business hours.  Please dont hesitate to call and ask to speak to one of the nurses if you have concerns.  For further questions, please visit [email or web address]

## 2014-07-11 NOTE — Discharge Summary (Signed)
Physician Discharge Summary  Patient ID: [name] MRN: [id number] DOB/AGE: 1970/03/12 44 y.o.  Admit date: 07/05/2014 Discharge date: 07/11/2014  Admission Diagnoses:  Anastomotic leak   Discharge Diagnoses: same Active Problems:   Peritoneal free air   S/P laparoscopic colectomy   Fever   Discharged Condition: good  Hospital Course: s/p laparoscopic-assisted sigmoid colectomy on 06/30/14 for recurrent diverticulitis.  Did well and regained bowel function.  Drain was removed and the patient was discharge on 07/05/14 - afebrile, normal WBC, having BM.  She went home and later that day began having more pelvic pain and fever.  She came back to the ED and was noted to have an elevated WBC and a CT scan showing retroperitoneal free air consistent with an anastomotic leak.  On 07/06/14, she underwent laparoscopic exploration with placement of a pelvic drain behind the anastomosis and a diverting loop ileostomy.  Her ostomy began to function on POD#2 and her diet was slowly advanced.  Her pain has been under control with Percocet.  Home health nursing has been arranged to assist with ostomy and drain care.  Consults: Wound Ostomy nursing  Significant Diagnostic Studies: radiology: Ct Abdomen Pelvis W Contrast  07/06/2014   CLINICAL DATA:  Increasing left lower quadrant pain, fever and chills. Post sigmoid colectomy six days prior, discharged earlier today, increasing pain.  EXAM: CT ABDOMEN AND PELVIS WITH CONTRAST  TECHNIQUE: Multidetector CT imaging of the abdomen and pelvis was performed using the standard protocol following bolus administration of intravenous contrast.  CONTRAST:  11mL OMNIPAQUE IOHEXOL 300 MG/ML  SOLN  COMPARISON:  CT 05/20/2014  FINDINGS: Linear atelectasis in the right and left lower lobes.  Post sigmoid colectomy with enteric chain sutures in the sigmoid colon. There is a moderate volume of extraluminal air adjacent to the surgical bed, tracking in the retroperitoneum  anterior to the psoas muscle. Only minimal fluid component is seen within the site, with minimal high-density material, likely complex fluid, medially. There is extraluminal air in the adjacent mesentery, with additional multi focal foci of free air in the anterior abdomen, upper abdomen, and presacral space. There is soft tissue induration at the surgical site. No extraluminal oral contrast is seen, however oral contrast is passes only to the cecum. There is a moderate volume of colonic stool. No drainable abscess.  Mild focal fatty infiltration adjacent with falciform ligament. Liver is otherwise unremarkable. The gallbladder, spleen, pancreas, and adrenal glands are normal. There is symmetric renal enhancement and excretion. No hydronephrosis or localizing abnormality.  The abdominal aorta is normal in caliber with minimal atherosclerosis. There is a retroperitoneal air. No retroperitoneal adenopathy.  Within the pelvis the urinary bladder is physiologically distended. Patient is post hysterectomy. No adnexal mass. There is no pelvic adenopathy. No deep pelvic fluid collection. Presacral air and induration is noted.  Postsurgical change in the anterior abdominal wall with midline skin staples. No fluid collection. Scattered  There are no acute or suspicious osseous abnormalities. Degenerative change in the lumbar spine with primarily facet arthropathy.  IMPRESSION: Post recent sigmoid colon resection, with a moderate amount of free intra-abdominal air. This is greater than expected 6 days postoperative, and concerning for leak. There is no drainable fluid collection, the largest air pocket has a small complex fluid component at the surgical be, and is immediately adjacent to the left iliac vasculature. Additional free air seen tracking in the retroperitoneum, mesenteric, upper abdomen, and presacral space.  These results were called by telephone at the time of  interpretation on 07/06/2014 at 2:29 am to [place] , who verbally acknowledged these results.   Electronically Signed   By: Jeb Levering M.D.   On: 07/06/2014 02:30      Treatments: surgery - as described above  Discharge Exam: Blood pressure 122/77, pulse 90, temperature 98.6 F (37 C), temperature source Oral, resp. rate 16, height 5' 4.02" (1.626 m), weight 85.8 kg (189 lb 2.5 oz), SpO2 100 %. General appearance: alert, cooperative and no distress Resp: clear to auscultation bilaterally Cardio: regular rate and rhythm, S1, S2 normal, no murmur, click, rub or gallop GI: soft, incisional tenderness; drain serous output; ostomy - brown pasty output Incisions c/d/i  Disposition: 01-Home or Self Care  Discharge Instructions    Call MD for:  persistant nausea and vomiting    Complete by:  As directed      Call MD for:  redness, tenderness, or signs of infection (pain, swelling, redness, odor or green/yellow discharge around incision site)    Complete by:  As directed      Call MD for:  severe uncontrolled pain    Complete by:  As directed      Call MD for:  temperature >100.4    Complete by:  As directed      Diet general    Complete by:  As directed      Discharge wound care:    Complete by:  As directed   [place] for ostomy care. No special dressing needed for staple lines - will remove staples in the office Record drain output and bring this chart with you to the office. Keep drain to bulb suction.     Driving Restrictions    Complete by:  As directed   Do not drive while taking pain medications     Increase activity slowly    Complete by:  As directed      May shower / Bathe    Complete by:  As directed      May walk up steps    Complete by:  As directed             Medication List    STOP taking these medications        docusate sodium 100 MG capsule  Commonly known as:  COLACE     HYDROcodone-acetaminophen 7.5-325 MG per tablet  Commonly known as:  NORCO     neomycin 500 MG tablet   Commonly known as:  MYCIFRADIN     ondansetron 4 MG tablet  Commonly known as:  ZOFRAN      TAKE these medications        Black Cohosh 20 MG Tabs  Take 40 mg by mouth daily.     Ciclopirox 0.77 % gel  Apply 1 application topically 2 (two) times daily.     ciprofloxacin 500 MG tablet  Commonly known as:  CIPRO  Take 1 tablet (500 mg total) by mouth 2 (two) times daily.     HAIR/SKIN/NAILS/BIOTIN Tabs  Take 2 tablets by mouth daily with breakfast.     ibuprofen 800 MG tablet  Commonly known as:  ADVIL,MOTRIN  Take 800 mg by mouth every 8 (eight) hours as needed for moderate pain.     lisinopril 10 MG tablet  Commonly known as:  PRINIVIL,ZESTRIL  Take 10 mg by mouth daily.     metroNIDAZOLE 500 MG tablet  Commonly known as:  FLAGYL  Take 1 tablet (500 mg total)  by mouth every 8 (eight) hours.     oxyCODONE-acetaminophen 5-325 MG per tablet  Commonly known as:  PERCOCET/ROXICET  Take 1 tablet by mouth every 4 (four) hours as needed for moderate pain.     polyethylene glycol packet  Commonly known as:  MIRALAX / GLYCOLAX  Take 17 g by mouth daily as needed for moderate constipation.     promethazine 25 MG tablet  Commonly known as:  PHENERGAN  Take 1 tablet (25 mg total) by mouth every 6 (six) hours as needed for nausea or vomiting.     saccharomyces boulardii 250 MG capsule  Commonly known as:  FLORASTOR  Take 1 capsule (250 mg total) by mouth 2 (two) times daily.           Follow-up Information    Follow up with [facility].   Why:  home health RN   Contact information:   [address] [place] [address] [phone]       Follow up with Maia Petties., MD In 3 days.   Specialty:  General Surgery   Why:  For suture removal   Contact information:   [address] [place] [address] [phone]       Signed: Maia Petties. 07/11/2014, 7:48 AM

## 2014-07-11 NOTE — Consult Note (Signed)
WOC ostomy follow up Pt had ileostomy surgery to RLQ on 5/18. Stoma type/location: Stoma to RLQ red and viable, 1 1/4 inches, flush with skin level. Red rubber rod sutured in place underneath stoma. Peristomal assessment: Intact skin surrounding Output: 100 cc dark brown liquid stool Ostomy pouching: 2pc.  Education provided: Pt assisted with pouch application. Pt asked appropriate questions and was able to open and close the velcro to empty. Discussed pouch change frequency, dietary precautions, and ordering supplies. Pt will need rod D/Ced at [place] office visit. She plans to have home health assistance after discharge according to the EMR. Educational materials left at bedside and extra supplies in room for bedside nurse use. She denies further questions at this time. Enrolled patient in [facility] Discharge program: Yes Julien Girt MSN, RN, [place], [place]       Revision History

## 2014-07-12 LAB — CULTURE, BLOOD (ROUTINE X 2): CULTURE: NO GROWTH

## 2014-07-13 LAB — CULTURE, BLOOD (ROUTINE X 2): CULTURE: NO GROWTH

## 2014-07-21 ENCOUNTER — Encounter (HOSPITAL_COMMUNITY): Payer: Self-pay | Admitting: *Deleted

## 2014-07-21 ENCOUNTER — Inpatient Hospital Stay (HOSPITAL_COMMUNITY)
Admission: EM | Admit: 2014-07-21 | Discharge: 2014-07-27 | DRG: 863 | Disposition: A | Discharge status: Home / Self Care | Payer: BLUE CROSS/BLUE SHIELD | Attending: Surgery | Admitting: Surgery

## 2014-07-21 ENCOUNTER — Emergency Department (HOSPITAL_COMMUNITY): Payer: BLUE CROSS/BLUE SHIELD

## 2014-07-21 DIAGNOSIS — B952 Enterococcus as the cause of diseases classified elsewhere: Secondary | ICD-10-CM | POA: Diagnosis present

## 2014-07-21 DIAGNOSIS — R109 Unspecified abdominal pain: Secondary | ICD-10-CM

## 2014-07-21 DIAGNOSIS — K6811 Postprocedural retroperitoneal abscess: Principal | ICD-10-CM | POA: Diagnosis present

## 2014-07-21 DIAGNOSIS — I10 Essential (primary) hypertension: Secondary | ICD-10-CM | POA: Diagnosis present

## 2014-07-21 DIAGNOSIS — Z87891 Personal history of nicotine dependence: Secondary | ICD-10-CM | POA: Diagnosis present

## 2014-07-21 DIAGNOSIS — Z9049 Acquired absence of other specified parts of digestive tract: Secondary | ICD-10-CM | POA: Diagnosis present

## 2014-07-21 DIAGNOSIS — K219 Gastro-esophageal reflux disease without esophagitis: Secondary | ICD-10-CM | POA: Diagnosis present

## 2014-07-21 DIAGNOSIS — K5732 Diverticulitis of large intestine without perforation or abscess without bleeding: Secondary | ICD-10-CM | POA: Diagnosis present

## 2014-07-21 DIAGNOSIS — K651 Peritoneal abscess: Secondary | ICD-10-CM | POA: Diagnosis present

## 2014-07-21 DIAGNOSIS — Z932 Ileostomy status: Secondary | ICD-10-CM

## 2014-07-21 DIAGNOSIS — Y832 Surgical operation with anastomosis, bypass or graft as the cause of abnormal reaction of the patient, or of later complication, without mention of misadventure at the time of the procedure: Secondary | ICD-10-CM | POA: Diagnosis present

## 2014-07-21 DIAGNOSIS — R091 Pleurisy: Secondary | ICD-10-CM

## 2014-07-21 DIAGNOSIS — Z90710 Acquired absence of both cervix and uterus: Secondary | ICD-10-CM | POA: Diagnosis not present

## 2014-07-21 DIAGNOSIS — Z881 Allergy status to other antibiotic agents status: Secondary | ICD-10-CM | POA: Diagnosis not present

## 2014-07-21 DIAGNOSIS — B9689 Other specified bacterial agents as the cause of diseases classified elsewhere: Secondary | ICD-10-CM | POA: Diagnosis present

## 2014-07-21 DIAGNOSIS — Z8619 Personal history of other infectious and parasitic diseases: Secondary | ICD-10-CM | POA: Diagnosis not present

## 2014-07-21 DIAGNOSIS — Z880 Allergy status to penicillin: Secondary | ICD-10-CM

## 2014-07-21 DIAGNOSIS — R1030 Lower abdominal pain, unspecified: Secondary | ICD-10-CM

## 2014-07-21 HISTORY — DX: Peritoneal abscess: K65.1

## 2014-07-21 LAB — PREGNANCY, URINE: Preg Test, Ur: NEGATIVE

## 2014-07-21 LAB — COMPREHENSIVE METABOLIC PANEL
ALBUMIN: 3.9 g/dL (ref 3.5–5.0)
ALT: 14 U/L (ref 14–54)
ANION GAP: 14 (ref 5–15)
AST: 20 U/L (ref 15–41)
Alkaline Phosphatase: 45 U/L (ref 38–126)
BILIRUBIN TOTAL: 0.4 mg/dL (ref 0.3–1.2)
BUN: 9 mg/dL (ref 6–20)
CHLORIDE: 98 mmol/L — AB (ref 101–111)
CO2: 23 mmol/L (ref 22–32)
Calcium: 10 mg/dL (ref 8.9–10.3)
Creatinine, Ser: 0.93 mg/dL (ref 0.44–1.00)
GFR calc non Af Amer: >60 mL/min (ref >60)
Glucose, Bld: 125 mg/dL — ABNORMAL HIGH (ref 65–99)
POTASSIUM: 3.9 mmol/L (ref 3.5–5.1)
SODIUM: 135 mmol/L (ref 135–145)
TOTAL PROTEIN: 8.1 g/dL (ref 6.5–8.1)

## 2014-07-21 LAB — CBC WITH DIFFERENTIAL/PLATELET
Basophils Absolute: 0 10*3/uL (ref 0.0–0.1)
Basophils Relative: 0 % (ref 0–1)
Eosinophils Absolute: 0.8 10*3/uL — ABNORMAL HIGH (ref 0.0–0.7)
Eosinophils Relative: 8 % — ABNORMAL HIGH (ref 0–5)
HCT: 38.4 % (ref 36.0–46.0)
Hemoglobin: 12.6 g/dL (ref 12.0–15.0)
Lymphocytes Relative: 28 % (ref 12–46)
Lymphs Abs: 3 10*3/uL (ref 0.7–4.0)
MCH: 29.4 pg (ref 26.0–34.0)
MCHC: 32.8 g/dL (ref 30.0–36.0)
MCV: 89.5 fL (ref 78.0–100.0)
Monocytes Absolute: 1.2 10*3/uL — ABNORMAL HIGH (ref 0.1–1.0)
Monocytes Relative: 11 % (ref 3–12)
Neutro Abs: 5.7 10*3/uL (ref 1.7–7.7)
Neutrophils Relative %: 53 % (ref 43–77)
Platelets: 678 10*3/uL — ABNORMAL HIGH (ref 150–400)
RBC: 4.29 MIL/uL (ref 3.87–5.11)
RDW: 13.5 % (ref 11.5–15.5)
WBC: 10.8 10*3/uL — AB (ref 4.0–10.5)

## 2014-07-21 LAB — URINALYSIS, ROUTINE W REFLEX MICROSCOPIC
BILIRUBIN URINE: NEGATIVE
Glucose, UA: NEGATIVE mg/dL
HGB URINE DIPSTICK: NEGATIVE
Ketones, ur: 15 mg/dL — AB
Nitrite: NEGATIVE
PH: 5.5 (ref 5.0–8.0)
Protein, ur: 30 mg/dL — AB
Specific Gravity, Urine: 1.022 (ref 1.005–1.030)
UROBILINOGEN UA: 0.2 mg/dL (ref 0.0–1.0)

## 2014-07-21 LAB — URINE MICROSCOPIC-ADD ON

## 2014-07-21 LAB — LIPASE, BLOOD: LIPASE: 81 U/L — AB (ref 22–51)

## 2014-07-21 LAB — I-STAT CG4 LACTIC ACID, ED: LACTIC ACID, VENOUS: 0.86 mmol/L (ref 0.5–2.0)

## 2014-07-21 LAB — TROPONIN I: Troponin I: <0.03 ng/mL (ref <0.031)

## 2014-07-21 LAB — CBG MONITORING, ED: GLUCOSE-CAPILLARY: 116 mg/dL — AB (ref 65–99)

## 2014-07-21 MED ORDER — SODIUM CHLORIDE 0.9 % IV BOLUS (SEPSIS)
1000.0000 mL | Freq: Once | INTRAVENOUS | Status: AC
  Administered 2014-07-21: 1000 mL via INTRAVENOUS

## 2014-07-21 MED ORDER — ACETAMINOPHEN 650 MG RE SUPP: 650.0000 mg | Freq: Four times a day (QID) | RECTAL | Status: DC | PRN

## 2014-07-21 MED ORDER — MORPHINE SULFATE 4 MG/ML IJ SOLN
4.0000 mg | Freq: Once | INTRAMUSCULAR | Status: AC
  Administered 2014-07-21: 4 mg via INTRAVENOUS
  Filled 2014-07-21: qty 1

## 2014-07-21 MED ORDER — LISINOPRIL 10 MG PO TABS
10.0000 mg | ORAL_TABLET | Freq: Every day | ORAL | Status: DC
  Administered 2014-07-21 – 2014-07-27 (×7): 10 mg via ORAL
  Filled 2014-07-21 (×7): qty 1

## 2014-07-21 MED ORDER — MORPHINE SULFATE 2 MG/ML IJ SOLN
2.0000 mg | Freq: Once | INTRAMUSCULAR | Status: AC
  Administered 2014-07-21: 2 mg via INTRAVENOUS
  Filled 2014-07-21: qty 1

## 2014-07-21 MED ORDER — IOHEXOL 300 MG/ML  SOLN
100.0000 mL | Freq: Once | INTRAMUSCULAR | Status: AC | PRN
  Administered 2014-07-21: 100 mL via INTRAVENOUS

## 2014-07-21 MED ORDER — CIPROFLOXACIN IN D5W 400 MG/200ML IV SOLN
400.0000 mg | Freq: Two times a day (BID) | INTRAVENOUS | Status: DC
  Administered 2014-07-21 – 2014-07-26 (×10): 400 mg via INTRAVENOUS
  Filled 2014-07-21 (×13): qty 200

## 2014-07-21 MED ORDER — KCL IN DEXTROSE-NACL 20-5-0.45 MEQ/L-%-% IV SOLN
INTRAVENOUS | Status: DC
  Administered 2014-07-21 – 2014-07-22 (×2): via INTRAVENOUS
  Administered 2014-07-22: 100 mL/h via INTRAVENOUS
  Administered 2014-07-23 – 2014-07-24 (×2): via INTRAVENOUS
  Administered 2014-07-24: 1 mL via INTRAVENOUS
  Administered 2014-07-24: 01:00:00 via INTRAVENOUS
  Administered 2014-07-25: 100 mL/h via INTRAVENOUS
  Administered 2014-07-26: 08:00:00 via INTRAVENOUS
  Filled 2014-07-21 (×13): qty 1000

## 2014-07-21 MED ORDER — ONDANSETRON HCL 4 MG/2ML IJ SOLN
4.0000 mg | Freq: Once | INTRAMUSCULAR | Status: AC
  Administered 2014-07-21: 4 mg via INTRAVENOUS
  Filled 2014-07-21: qty 2

## 2014-07-21 MED ORDER — METRONIDAZOLE IN NACL 5-0.79 MG/ML-% IV SOLN
500.0000 mg | Freq: Three times a day (TID) | INTRAVENOUS | Status: DC
  Administered 2014-07-21 – 2014-07-26 (×14): 500 mg via INTRAVENOUS
  Filled 2014-07-21 (×17): qty 100

## 2014-07-21 MED ORDER — PANTOPRAZOLE SODIUM 40 MG PO TBEC
40.0000 mg | DELAYED_RELEASE_TABLET | Freq: Every day | ORAL | Status: DC
  Administered 2014-07-21 – 2014-07-27 (×7): 40 mg via ORAL
  Filled 2014-07-21 (×7): qty 1

## 2014-07-21 MED ORDER — ONDANSETRON HCL 4 MG/2ML IJ SOLN: 4.0000 mg | Freq: Four times a day (QID) | INTRAMUSCULAR | Status: DC | PRN

## 2014-07-21 MED ORDER — MORPHINE SULFATE 2 MG/ML IJ SOLN
1.0000 mg | INTRAMUSCULAR | Status: DC | PRN
  Administered 2014-07-21 – 2014-07-24 (×9): 2 mg via INTRAVENOUS
  Administered 2014-07-24: 4 mg via INTRAVENOUS
  Filled 2014-07-21 (×8): qty 1
  Filled 2014-07-21: qty 2
  Filled 2014-07-21: qty 1

## 2014-07-21 MED ORDER — ACETAMINOPHEN 325 MG PO TABS: 650.0000 mg | ORAL_TABLET | Freq: Four times a day (QID) | ORAL | Status: DC | PRN

## 2014-07-21 MED ORDER — IOHEXOL 300 MG/ML  SOLN: 25.0000 mL | INTRAMUSCULAR | Status: DC

## 2014-07-21 MED ORDER — DIPHENHYDRAMINE HCL 12.5 MG/5ML PO ELIX
12.5000 mg | ORAL_SOLUTION | Freq: Four times a day (QID) | ORAL | Status: DC | PRN
Start: 2014-07-21 — End: 2014-07-27
  Administered 2014-07-26: 25 mg via ORAL
  Filled 2014-07-21: qty 10

## 2014-07-21 MED ORDER — DIPHENHYDRAMINE HCL 50 MG/ML IJ SOLN: 12.5000 mg | Freq: Four times a day (QID) | INTRAMUSCULAR | Status: DC | PRN

## 2014-07-21 MED ORDER — OXYCODONE HCL 5 MG PO TABS
5.0000 mg | ORAL_TABLET | ORAL | Status: DC | PRN
  Administered 2014-07-22 – 2014-07-23 (×4): 5 mg via ORAL
  Administered 2014-07-24 (×2): 10 mg via ORAL
  Administered 2014-07-24: 5 mg via ORAL
  Administered 2014-07-24 – 2014-07-26 (×5): 10 mg via ORAL
  Filled 2014-07-21 (×3): qty 2
  Filled 2014-07-21 (×2): qty 1
  Filled 2014-07-21 (×7): qty 2

## 2014-07-21 MED ORDER — IOHEXOL 300 MG/ML  SOLN
25.0000 mL | INTRAMUSCULAR | Status: AC
Start: 2014-07-21 — End: 2014-07-21
  Administered 2014-07-21: 25 mL via ORAL

## 2014-07-21 NOTE — ED Notes (Signed)
Pt ambulating to restroom without distress to provide urine sample.

## 2014-07-21 NOTE — H&P (Signed)
Chief Complaint: nausea/vomiting  HPI:  [name] is a 44 year old female s/p lap assisted sigmoid colectomy for recurrent diverticulitis 06/30/14 with subsequent laparoscopic exploration with placement of a pelvic drain behind the anastomosis and diverting loop colostomy on 07/06/14.  She was discharged on 07/11/14 and was doing fairly well until last night at which time she developed nausea and dry heaves along with some chills and sweats.  Temperature no higher than 99 on Monday.  Appetite has been ok and drinking ensures.  She has also been on cipro/flagyl since her last admission.  She had her surgical drain removed last Friday in the office by Dr. Georgette Dover.  The patient presented today due to increase in LLQ abdominal pain and nausea.  She has a CT scan that reveals a recurrent abscess next to her anastomosis c/e a leak.  We have been asked to evaluate the patient for admission.   Past Medical History  Diagnosis Date  . History of syphilis   . Breast fibroadenoma   . History of ectopic pregnancy 2005    r salpyngectomy  . History of conization of cervix 2000  . H/O myomectomy 07-2006    robotic  . History of hysterectomy, supracervical 08-2008    robotic  . PMS (premenstrual syndrome)   . Back pain   . H/O sinusitis   . GERD (gastroesophageal reflux disease)   . Hx of herpes simplex type 2 infection   . History of syphilis   . Abnormal Pap smear 2000    Cone Biopsy  . H/O metrorrhagia 12/2006  . Hypertension   . Headache(784.0)   . Diverticulitis of colon with perforation 05/20/2014    Past Surgical History  Procedure Laterality Date  . Cervical cone biopsy  2000  . Ectopic pregnancy surgery  2005    R salpyngectomy  . Myomectomy  2008    robotic  . Robotic assisted lap vaginal hysterectomy  2010    supracervical  . Polypectomy  2009  . Salpingectomy  2005  . Dilation and curettage of uterus  2009  . Abdominal hysterectomy    . Breast surgery  2004    fibroademoma   . Breast lumpectomy  10/11    rt-neg  . Trigger finger release Right 09/28/2012    Procedure: RELEASE TRIGGER FINGER/A-1 PULLEY RIGHT THUMB;  Surgeon: Jolyn Nap, MD;  Location: [facility];  Service: Orthopedics;  Laterality: Right;  . Laparoscopic sigmoid colectomy N/A 06/30/2014    Procedure: LAPAROSCOPIC ASSISTED SIGMOID COLECTOMY;  Surgeon: Donnie Mesa, MD;  Location: [facility];  Service: General;  Laterality: N/A;  . Colon resection N/A 07/06/2014    Procedure: LAPAROSCOPIC LOOP ILEOSTOMY WITH PLACEMENT OF PELVIC DRAIN;  Surgeon: Donnie Mesa, MD;  Location: [facility];  Service: General;  Laterality: N/A;    Family History  Problem Relation Age of Onset  . Hypertension Paternal Grandmother   . Diabetes Paternal Grandmother   . Hypertension Maternal Grandmother   . Sarcoidosis Mother   . Hypertension Mother   . Thyroid disease Maternal Aunt   . Mental illness Cousin    Social History:  reports that she quit smoking about 3 years ago. Her smoking use included Cigarettes. She has a 3.75 pack-year smoking history. She has never used smokeless tobacco. She reports that she drinks about 1.5 oz of alcohol per week. She reports that she does not use illicit drugs.  Allergies:  Allergies  Allergen Reactions  . Keflex [Cephalexin] Anaphylaxis  .  Penicillins Anaphylaxis     (Not in a hospital admission)  Results for orders placed or performed during the hospital encounter of 07/21/14 (from the past 48 hour(s))  CBC with Differential/Platelet     Status: Abnormal   Collection Time: 07/21/14  7:23 AM  Result Value Ref Range   WBC 10.8 (H) 4.0 - 10.5 K/uL   RBC 4.29 3.87 - 5.11 MIL/uL   Hemoglobin 12.6 12.0 - 15.0 g/dL   HCT 38.4 36.0 - 46.0 %   MCV 89.5 78.0 - 100.0 fL   MCH 29.4 26.0 - 34.0 pg   MCHC 32.8 30.0 - 36.0 g/dL   RDW 13.5 11.5 - 15.5 %   Platelets 678 (H) 150 - 400 K/uL   Neutrophils Relative % 53 43 - 77 %   Neutro Abs 5.7 1.7 - 7.7 K/uL    Lymphocytes Relative 28 12 - 46 %   Lymphs Abs 3.0 0.7 - 4.0 K/uL   Monocytes Relative 11 3 - 12 %   Monocytes Absolute 1.2 (H) 0.1 - 1.0 K/uL   Eosinophils Relative 8 (H) 0 - 5 %   Eosinophils Absolute 0.8 (H) 0.0 - 0.7 K/uL   Basophils Relative 0 0 - 1 %   Basophils Absolute 0.0 0.0 - 0.1 K/uL  Comprehensive metabolic panel     Status: Abnormal   Collection Time: 07/21/14  7:23 AM  Result Value Ref Range   Sodium 135 135 - 145 mmol/L   Potassium 3.9 3.5 - 5.1 mmol/L   Chloride 98 (L) 101 - 111 mmol/L   CO2 23 22 - 32 mmol/L   Glucose, Bld 125 (H) 65 - 99 mg/dL   BUN 9 6 - 20 mg/dL   Creatinine, Ser 0.93 0.44 - 1.00 mg/dL   Calcium 10.0 8.9 - 10.3 mg/dL   Total Protein 8.1 6.5 - 8.1 g/dL   Albumin 3.9 3.5 - 5.0 g/dL   AST 20 15 - 41 U/L   ALT 14 14 - 54 U/L   Alkaline Phosphatase 45 38 - 126 U/L   Total Bilirubin 0.4 0.3 - 1.2 mg/dL   GFR calc non Af Amer >60 >60 mL/min   GFR calc Af Amer >60 >60 mL/min    Comment: (NOTE) The eGFR has been calculated using the CKD EPI equation. This calculation has not been validated in all clinical situations. eGFR's persistently <60 mL/min signify possible Chronic Kidney Disease.    Anion gap 14 5 - 15  Lipase, blood     Status: Abnormal   Collection Time: 07/21/14  7:23 AM  Result Value Ref Range   Lipase 81 (H) 22 - 51 U/L  Troponin I     Status: None   Collection Time: 07/21/14  7:23 AM  Result Value Ref Range   Troponin I <0.03 <0.031 ng/mL    Comment:        NO INDICATION OF MYOCARDIAL INJURY.   Urinalysis, Routine w reflex microscopic (not at [facility])     Status: Abnormal   Collection Time: 07/21/14  7:31 AM  Result Value Ref Range   Color, Urine AMBER (A) YELLOW    Comment: BIOCHEMICALS MAY BE AFFECTED BY COLOR   APPearance CLOUDY (A) CLEAR   Specific Gravity, Urine 1.022 1.005 - 1.030   pH 5.5 5.0 - 8.0   Glucose, UA NEGATIVE NEGATIVE mg/dL   Hgb urine dipstick NEGATIVE NEGATIVE   Bilirubin Urine NEGATIVE NEGATIVE    Ketones, ur 15 (A) NEGATIVE mg/dL  Protein, ur 30 (A) NEGATIVE mg/dL   Urobilinogen, UA 0.2 0.0 - 1.0 mg/dL   Nitrite NEGATIVE NEGATIVE   Leukocytes, UA SMALL (A) NEGATIVE  Pregnancy, urine     Status: None   Collection Time: 07/21/14  7:31 AM  Result Value Ref Range   Preg Test, Ur NEGATIVE NEGATIVE    Comment:        THE SENSITIVITY OF THIS METHODOLOGY IS >20 mIU/mL.   Urine microscopic-add on     Status: Abnormal   Collection Time: 07/21/14  7:31 AM  Result Value Ref Range   Squamous Epithelial / LPF MANY (A) RARE   WBC, UA 7-10 <3 WBC/hpf   Bacteria, UA MANY (A) RARE   Urine-Other MUCOUS PRESENT   CBG monitoring, ED     Status: Abnormal   Collection Time: 07/21/14  7:56 AM  Result Value Ref Range   Glucose-Capillary 116 (H) 65 - 99 mg/dL  I-Stat CG4 Lactic Acid, ED     Status: None   Collection Time: 07/21/14 10:09 AM  Result Value Ref Range   Lactic Acid, Venous 0.86 0.5 - 2.0 mmol/L   Ct Abdomen Pelvis W Contrast  07/21/2014   CLINICAL DATA:  Post ileostomy creation (5/18), now with vomiting and lower abdominal pain near the incision site.  EXAM: CT ABDOMEN AND PELVIS WITH CONTRAST  TECHNIQUE: Multidetector CT imaging of the abdomen and pelvis was performed using the standard protocol following bolus administration of intravenous contrast.  CONTRAST:  179m OMNIPAQUE IOHEXOL 300 MG/ML  SOLN  COMPARISON:  CT abdomen pelvis -07/06/2014 ; 05/20/2014  FINDINGS: Postsurgical change following partial resection of the sigmoid colon. There has been interval development of an approximately 1.9 x 3.5 x 5 cm cm peripherally enhancing fluid collection adjacent to the enteric anastomosis (axial image 60, series 2; coronal image 39, series 5) with small amount of adjacent subcutaneous emphysema (image 57, series 2) with air seen tracking along the left perirenal space to the left upper abdominal quadrant (representative images 18, 21 and 30, series 2). No additional definable/drainable fluid  collection.  The patient has undergone a loop ileostomy creation within the right lower abdominal quadrant. A minimal amount of enteric contrast material remains within the cecum however there is otherwise expected decompression of the remainder of the colon. No upstream distention of the small bowel to suggest enteric obstruction. No pneumatosis or portal venous gas.  Scattered mixed calcified and noncalcified atherosclerotic plaque within a normal caliber abdominal aorta. The major branch vessels of the abdominal aorta appear patent on this non CTA examination.  No bulky retroperitoneal, mesenteric, pelvic or inguinal lymphadenopathy.  Post hysterectomy. No discrete adnexal lesion. No free fluid in the pelvic cul-de-sac.  Normal hepatic contour. There is a minimal amount of focal fatty infiltration adjacent to the fissure for ligamentum teres. No discrete hepatic lesions. Normal appearance of the gallbladder. No radiopaque gallstones. No intra extrahepatic biliary ductal dilatation. No ascites.  There is symmetric enhancement and excretion of the bilateral kidneys. No renal stones on this postcontrast examination. No discrete renal lesions. No urinary obstruction or perinephric stranding. Normal appearance of the bilateral adrenal glands, pancreas and spleen.  Limited visualization the lower thorax demonstrates minimal dependent subpleural ground-glass atelectasis. No discrete focal airspace opacities. No pleural effusion.  Normal heart size.  No pericardial effusion.  No acute or aggressive osseous abnormalities.  Regional soft tissues appear normal.  IMPRESSION: 1. Post partial resection of the sigmoid colon with development of an approximately 5 cm  perianastomotic abscess with small amount of air tracking along the left perirenal space to the level of the left upper abdominal quadrant. This abscess may be amenable to CT-guided percutaneous drainage catheter placement as indicated. 2. Post right lower quadrant  loop ileostomy creation without evidence of enteric obstruction.   Electronically Signed   By: Sandi Mariscal M.D.   On: 07/21/2014 13:03   Dg Abd Acute W/chest  07/21/2014   CLINICAL DATA:  Nausea and vomiting.  EXAM: DG ABDOMEN ACUTE W/ 1V CHEST  COMPARISON:  CT of the abdomen and pelvis on 07/06/2014  FINDINGS: Minimal atelectasis at the right lung base. There is no evidence of pulmonary edema, consolidation, pneumothorax, nodule or pleural fluid. The heart size and mediastinal contours are normal.  Abdominal films show no evidence of bowel obstruction or ileus. Colostomy present in the right abdomen. No free intraperitoneal air identified. No abnormal calcifications. Visualized bony structures show facet hypertrophy in the lower lumbar spine.  IMPRESSION: No acute findings.  No evidence of bowel obstruction or free air.   Electronically Signed   By: Aletta Edouard M.D.   On: 07/21/2014 08:55    ROS : Please see HPI, otherwise negative  Blood pressure 127/75, pulse 101, temperature 98.8 F (37.1 C), temperature source Oral, resp. rate 18, SpO2 99 %. Physical Exam   General: pleasant, WD, WN black female who is laying in bed in NAD HEENT: head is normocephalic, atraumatic.  Sclera are noninjected.  PERRL.  Ears and nose without any masses or lesions.  Mouth is pink and moist Heart: regular, rate, and rhythm.  Normal s1,s2. No obvious murmurs, gallops, or rubs noted.  Palpable radial and pedal pulses bilaterally Lungs: CTAB, no wheezes, rhonchi, or rales noted.  Respiratory effort nonlabored Abd: soft, tender in LLQ, ND, +BS, incision well healed, right sided ileostomy with good output and stoma is pink and viable. no masses, hernias, or organomegaly MS: all 4 extremities are symmetrical with no cyanosis, clubbing, or edema. Skin: warm and dry with no masses, lesions, or rashes Psych: A&Ox3 with an appropriate affect.   Assessment/Plan 1. S/p lap assisted sigmoid colectomy with subsequent leak  and diverting loop ileostomy, now with recurrent abscess -admit -IVF, IV abx therapy -will d/w IR about replacing drain in this fluid collection -npo for now, pending drain placement -D/W Dr. Brantley Stage.  Erby Pian 07/21/2014, 1:54 PM

## 2014-07-21 NOTE — ED Notes (Signed)
Patient is resting comfortably. 

## 2014-07-21 NOTE — ED Notes (Signed)
Pt remains monitored by blood pressure, pulse ox, and 5 lead. pts family remains at bedside.  

## 2014-07-21 NOTE — ED Notes (Signed)
Pt returned from CT; no signs of distress.

## 2014-07-21 NOTE — ED Notes (Signed)
Pt. Is status post ileostomy on 5/18 and started vomiting this morning at 3am. Pt has vomited 4 times this AM. Pt. States she is unable to keep anything down. C/o left lower abdominal pain near incision site.

## 2014-07-21 NOTE — ED Notes (Signed)
Notified RN of 116

## 2014-07-21 NOTE — ED Notes (Signed)
PT ambulated to restroom without distress.  

## 2014-07-21 NOTE — Progress Notes (Signed)
Patient ID: [name], female   DOB: 1970/11/02, 44 y.o.   MRN: [id number]    Request for intra abdominal abscess drain placement Imaging reviewed with Dr Redgie Grayer procedure IR PA will see pt in am for consent and evaluation  See npo orders Please hold any anticoagulation til after procedure

## 2014-07-21 NOTE — ED Notes (Signed)
U/a culture sent down as a Add On

## 2014-07-21 NOTE — ED Notes (Signed)
Patient transported to CT 

## 2014-07-21 NOTE — ED Notes (Signed)
CT called and notified ready.

## 2014-07-21 NOTE — ED Provider Notes (Signed)
CSN: [id number]     Arrival date & time 07/21/14  5573 History   First MD Initiated Contact with Patient 07/21/14 [phone]     Chief Complaint  Patient presents with  . Emesis     (Consider location/radiation/quality/duration/timing/severity/associated sxs/prior Treatment) The history is provided by the patient. No language interpreter was used.  [name] is a 44 y/o F with PMHx of syphilis, ectopic pregnancy, myomectomy, back pain, GERD, hypertension, diverticulitis, abdominal perforation with laparoscopic colectomy and ileostomy placement on 06/30/2014 presenting to the ED with vomiting that started approximately 3:00 AM this morning. Patient reports that she randomly woke up at approximately 3:00 AM this morning with sudden onset of nausea and has continued to vomit since then. Patient stated that she's been unable to keep anything down since 3:00 AM, juice, water. Stated that the emesis is mainly clear fluid-NB/NB. Reports she's been experiencing abdominal soreness, burning in the stomach. Stated that her stools are pasty without blood or mucus. Reported that she continues to take Cipro and Flagyl for her diverticulitis issue. Reported that she's been trying to use Phenergan without relief. Denied fever, chills, chest pain, shortness of breath, difficulty breathing, melena, hematochezia, dizziness, back pain, neck pain, neck stiffness, irritation or drainage around the ileostomy back. PCP Dr. Kathyrn Drown  Past Medical History  Diagnosis Date  . History of syphilis   . Breast fibroadenoma   . History of ectopic pregnancy 2005    r salpyngectomy  . History of conization of cervix 2000  . H/O myomectomy 07-2006    robotic  . History of hysterectomy, supracervical 08-2008    robotic  . PMS (premenstrual syndrome)   . Back pain   . H/O sinusitis   . GERD (gastroesophageal reflux disease)   . Hx of herpes simplex type 2 infection   . History of syphilis   . Abnormal Pap smear 2000   [facility] Biopsy  . H/O metrorrhagia 12/2006  . Hypertension   . Headache(784.0)   . Diverticulitis of colon with perforation 05/20/2014   Past Surgical History  Procedure Laterality Date  . Cervical cone biopsy  2000  . Ectopic pregnancy surgery  2005    R salpyngectomy  . Myomectomy  2008    robotic  . Robotic assisted lap vaginal hysterectomy  2010    supracervical  . Polypectomy  2009  . Salpingectomy  2005  . Dilation and curettage of uterus  2009  . Abdominal hysterectomy    . Breast surgery  2004    fibroademoma  . Breast lumpectomy  10/11    rt-neg  . Trigger finger release Right 09/28/2012    Procedure: RELEASE TRIGGER FINGER/A-1 PULLEY RIGHT THUMB;  Surgeon: Jolyn Nap, MD;  Location: [facility];  Service: Orthopedics;  Laterality: Right;  . Laparoscopic sigmoid colectomy N/A 06/30/2014    Procedure: LAPAROSCOPIC ASSISTED SIGMOID COLECTOMY;  Surgeon: Donnie Mesa, MD;  Location: [facility];  Service: General;  Laterality: N/A;  . Colon resection N/A 07/06/2014    Procedure: LAPAROSCOPIC LOOP ILEOSTOMY WITH PLACEMENT OF PELVIC DRAIN;  Surgeon: Donnie Mesa, MD;  Location: [facility];  Service: General;  Laterality: N/A;   Family History  Problem Relation Age of Onset  . Hypertension Paternal Grandmother   . Diabetes Paternal Grandmother   . Hypertension Maternal Grandmother   . Sarcoidosis Mother   . Hypertension Mother   . Thyroid disease Maternal Aunt   . Mental illness Cousin    History  Substance Use Topics  .  Smoking status: Former Smoker -- 0.25 packs/day for 15 years    Types: Cigarettes    Quit date: 06/12/2011  . Smokeless tobacco: Never Used  . Alcohol Use: 1.5 oz/week    3 Standard drinks or equivalent per week   OB History    Gravida Para Term Preterm AB TAB SAB Ectopic Multiple Living   4 2 2  2 1  0 1 0 2     Review of Systems  Constitutional: Negative for fever and chills.  Respiratory: Negative for chest tightness and shortness of  breath.   Cardiovascular: Negative for chest pain.  Gastrointestinal: Positive for nausea, vomiting and abdominal pain. Negative for diarrhea, constipation, blood in stool and anal bleeding.  Genitourinary: Negative for decreased urine volume.  Musculoskeletal: Negative for back pain, neck pain and neck stiffness.  Neurological: Negative for dizziness, weakness and headaches.      Allergies  Keflex and Penicillins  Home Medications   Prior to Admission medications   Medication Sig Start Date End Date Taking? Authorizing Provider  ciprofloxacin (CIPRO) 500 MG tablet Take 1 tablet (500 mg total) by mouth 2 (two) times daily. 07/11/14  Yes Donnie Mesa, MD  esomeprazole (NEXIUM) 40 MG capsule Take 40 mg by mouth daily. 07/14/14  Yes Historical Provider, MD  Hydrocodone-Acetaminophen 5-300 MG TABS Take 1 tablet by mouth every 4 (four) hours as needed. pain 07/14/14  Yes Historical Provider, MD  ibuprofen (ADVIL,MOTRIN) 800 MG tablet Take 800 mg by mouth every 8 (eight) hours as needed for moderate pain.   Yes Historical Provider, MD  lisinopril (PRINIVIL,ZESTRIL) 10 MG tablet Take 10 mg by mouth daily.   Yes Historical Provider, MD  metroNIDAZOLE (FLAGYL) 500 MG tablet Take 1 tablet (500 mg total) by mouth every 8 (eight) hours. 07/11/14  Yes Donnie Mesa, MD  Multiple Vitamins-Minerals (HAIR/SKIN/NAILS/BIOTIN) TABS Take 2 tablets by mouth daily with breakfast.   Yes Historical Provider, MD   BP 117/71 mmHg  Pulse 107  Temp(Src) 98.6 F (37 C) (Oral)  Resp 10  SpO2 98% Physical Exam  Constitutional: She is oriented to person, place, and time. She appears well-developed and well-nourished. No distress.  HENT:  Head: Normocephalic and atraumatic.  Mouth/Throat: Oropharynx is clear and moist. No oropharyngeal exudate.  Eyes: Conjunctivae and EOM are normal. Pupils are equal, round, and reactive to light. Right eye exhibits no discharge. Left eye exhibits no discharge.  Neck: Normal range  of motion. Neck supple.  Cardiovascular: Regular rhythm and normal heart sounds.  Exam reveals no friction rub.   No murmur heard. Pulmonary/Chest: Effort normal and breath sounds normal. No respiratory distress. She has no wheezes. She has no rales.  Abdominal: Soft. Bowel sounds are normal. She exhibits no distension. There is tenderness in the right lower quadrant and left lower quadrant. There is no rebound and no guarding.  Surgical incisions identified with negative active drainage or bleeding-healing well Ileostomy identified to the right lower quadrant with brown liquidy stools without blood or mucus noted. Negative surrounding cellulitis identified.  Musculoskeletal: Normal range of motion.  Neurological: She is alert and oriented to person, place, and time. No cranial nerve deficit. She exhibits normal muscle tone. Coordination normal. GCS eye subscore is 4. GCS verbal subscore is 5. GCS motor subscore is 6.  Skin: Skin is warm and dry. No rash noted. She is not diaphoretic. No erythema.  Psychiatric: She has a normal mood and affect. Her behavior is normal. Thought content normal.  Nursing note and  vitals reviewed.   ED Course  Procedures (including critical care time)  Results for orders placed or performed during the hospital encounter of 07/21/14  CBC with Differential/Platelet  Result Value Ref Range   WBC 10.8 (H) 4.0 - 10.5 K/uL   RBC 4.29 3.87 - 5.11 MIL/uL   Hemoglobin 12.6 12.0 - 15.0 g/dL   HCT 38.4 36.0 - 46.0 %   MCV 89.5 78.0 - 100.0 fL   MCH 29.4 26.0 - 34.0 pg   MCHC 32.8 30.0 - 36.0 g/dL   RDW 13.5 11.5 - 15.5 %   Platelets 678 (H) 150 - 400 K/uL   Neutrophils Relative % 53 43 - 77 %   Neutro Abs 5.7 1.7 - 7.7 K/uL   Lymphocytes Relative 28 12 - 46 %   Lymphs Abs 3.0 0.7 - 4.0 K/uL   Monocytes Relative 11 3 - 12 %   Monocytes Absolute 1.2 (H) 0.1 - 1.0 K/uL   Eosinophils Relative 8 (H) 0 - 5 %   Eosinophils Absolute 0.8 (H) 0.0 - 0.7 K/uL   Basophils  Relative 0 0 - 1 %   Basophils Absolute 0.0 0.0 - 0.1 K/uL  Comprehensive metabolic panel  Result Value Ref Range   Sodium 135 135 - 145 mmol/L   Potassium 3.9 3.5 - 5.1 mmol/L   Chloride 98 (L) 101 - 111 mmol/L   CO2 23 22 - 32 mmol/L   Glucose, Bld 125 (H) 65 - 99 mg/dL   BUN 9 6 - 20 mg/dL   Creatinine, Ser 0.93 0.44 - 1.00 mg/dL   Calcium 10.0 8.9 - 10.3 mg/dL   Total Protein 8.1 6.5 - 8.1 g/dL   Albumin 3.9 3.5 - 5.0 g/dL   AST 20 15 - 41 U/L   ALT 14 14 - 54 U/L   Alkaline Phosphatase 45 38 - 126 U/L   Total Bilirubin 0.4 0.3 - 1.2 mg/dL   GFR calc non Af Amer >60 >60 mL/min   GFR calc Af Amer >60 >60 mL/min   Anion gap 14 5 - 15  Lipase, blood  Result Value Ref Range   Lipase 81 (H) 22 - 51 U/L  Urinalysis, Routine w reflex microscopic (not at [facility])  Result Value Ref Range   Color, Urine AMBER (A) YELLOW   APPearance CLOUDY (A) CLEAR   Specific Gravity, Urine 1.022 1.005 - 1.030   pH 5.5 5.0 - 8.0   Glucose, UA NEGATIVE NEGATIVE mg/dL   Hgb urine dipstick NEGATIVE NEGATIVE   Bilirubin Urine NEGATIVE NEGATIVE   Ketones, ur 15 (A) NEGATIVE mg/dL   Protein, ur 30 (A) NEGATIVE mg/dL   Urobilinogen, UA 0.2 0.0 - 1.0 mg/dL   Nitrite NEGATIVE NEGATIVE   Leukocytes, UA SMALL (A) NEGATIVE  Troponin I  Result Value Ref Range   Troponin I <0.03 <0.031 ng/mL  Pregnancy, urine  Result Value Ref Range   Preg Test, Ur NEGATIVE NEGATIVE  Urine microscopic-add on  Result Value Ref Range   Squamous Epithelial / LPF MANY (A) RARE   WBC, UA 7-10 <3 WBC/hpf   Bacteria, UA MANY (A) RARE   Urine-Other MUCOUS PRESENT   CBG monitoring, ED  Result Value Ref Range   Glucose-Capillary 116 (H) 65 - 99 mg/dL  I-Stat CG4 Lactic Acid, ED  Result Value Ref Range   Lactic Acid, Venous 0.86 0.5 - 2.0 mmol/L    Labs Review Labs Reviewed  CBC WITH DIFFERENTIAL/PLATELET - Abnormal; Notable for the following:  WBC 10.8 (*)    Platelets 678 (*)    Monocytes Absolute 1.2 (*)     Eosinophils Relative 8 (*)    Eosinophils Absolute 0.8 (*)    All other components within normal limits  COMPREHENSIVE METABOLIC PANEL - Abnormal; Notable for the following:    Chloride 98 (*)    Glucose, Bld 125 (*)    All other components within normal limits  LIPASE, BLOOD - Abnormal; Notable for the following:    Lipase 81 (*)    All other components within normal limits  URINALYSIS, ROUTINE W REFLEX MICROSCOPIC (NOT AT [facility]) - Abnormal; Notable for the following:    Color, Urine AMBER (*)    APPearance CLOUDY (*)    Ketones, ur 15 (*)    Protein, ur 30 (*)    Leukocytes, UA SMALL (*)    All other components within normal limits  URINE MICROSCOPIC-ADD ON - Abnormal; Notable for the following:    Squamous Epithelial / LPF MANY (*)    Bacteria, UA MANY (*)    All other components within normal limits  CBG MONITORING, ED - Abnormal; Notable for the following:    Glucose-Capillary 116 (*)    All other components within normal limits  URINE CULTURE  TROPONIN I  PREGNANCY, URINE  POC URINE PREG, ED  I-STAT CG4 LACTIC ACID, ED    Imaging Review Ct Abdomen Pelvis W Contrast  07/21/2014   CLINICAL DATA:  Post ileostomy creation (5/18), now with vomiting and lower abdominal pain near the incision site.  EXAM: CT ABDOMEN AND PELVIS WITH CONTRAST  TECHNIQUE: Multidetector CT imaging of the abdomen and pelvis was performed using the standard protocol following bolus administration of intravenous contrast.  CONTRAST:  128mL OMNIPAQUE IOHEXOL 300 MG/ML  SOLN  COMPARISON:  CT abdomen pelvis -07/06/2014 ; 05/20/2014  FINDINGS: Postsurgical change following partial resection of the sigmoid colon. There has been interval development of an approximately 1.9 x 3.5 x 5 cm cm peripherally enhancing fluid collection adjacent to the enteric anastomosis (axial image 60, series 2; coronal image 39, series 5) with small amount of adjacent subcutaneous emphysema (image 57, series 2) with air seen tracking  along the left perirenal space to the left upper abdominal quadrant (representative images 18, 21 and 30, series 2). No additional definable/drainable fluid collection.  The patient has undergone a loop ileostomy creation within the right lower abdominal quadrant. A minimal amount of enteric contrast material remains within the cecum however there is otherwise expected decompression of the remainder of the colon. No upstream distention of the small bowel to suggest enteric obstruction. No pneumatosis or portal venous gas.  Scattered mixed calcified and noncalcified atherosclerotic plaque within a normal caliber abdominal aorta. The major branch vessels of the abdominal aorta appear patent on this non CTA examination.  No bulky retroperitoneal, mesenteric, pelvic or inguinal lymphadenopathy.  Post hysterectomy. No discrete adnexal lesion. No free fluid in the pelvic cul-de-sac.  Normal hepatic contour. There is a minimal amount of focal fatty infiltration adjacent to the fissure for ligamentum teres. No discrete hepatic lesions. Normal appearance of the gallbladder. No radiopaque gallstones. No intra extrahepatic biliary ductal dilatation. No ascites.  There is symmetric enhancement and excretion of the bilateral kidneys. No renal stones on this postcontrast examination. No discrete renal lesions. No urinary obstruction or perinephric stranding. Normal appearance of the bilateral adrenal glands, pancreas and spleen.  Limited visualization the lower thorax demonstrates minimal dependent subpleural ground-glass atelectasis. No discrete focal  airspace opacities. No pleural effusion.  Normal heart size.  No pericardial effusion.  No acute or aggressive osseous abnormalities.  Regional soft tissues appear normal.  IMPRESSION: 1. Post partial resection of the sigmoid colon with development of an approximately 5 cm perianastomotic abscess with small amount of air tracking along the left perirenal space to the level of the  left upper abdominal quadrant. This abscess may be amenable to CT-guided percutaneous drainage catheter placement as indicated. 2. Post right lower quadrant loop ileostomy creation without evidence of enteric obstruction.   Electronically Signed   By: Sandi Mariscal M.D.   On: 07/21/2014 13:03   Dg Abd Acute W/chest  07/21/2014   CLINICAL DATA:  Nausea and vomiting.  EXAM: DG ABDOMEN ACUTE W/ 1V CHEST  COMPARISON:  CT of the abdomen and pelvis on 07/06/2014  FINDINGS: Minimal atelectasis at the right lung base. There is no evidence of pulmonary edema, consolidation, pneumothorax, nodule or pleural fluid. The heart size and mediastinal contours are normal.  Abdominal films show no evidence of bowel obstruction or ileus. Colostomy present in the right abdomen. No free intraperitoneal air identified. No abnormal calcifications. Visualized bony structures show facet hypertrophy in the lower lumbar spine.  IMPRESSION: No acute findings.  No evidence of bowel obstruction or free air.   Electronically Signed   By: Aletta Edouard M.D.   On: 07/21/2014 08:55     EKG Interpretation   Date/Time:  Thursday July 21 2014 07:54:21 EDT Ventricular Rate:  110 PR Interval:  127 QRS Duration: 80 QT Interval:  333 QTC Calculation: 450 R Axis:   62 Text Interpretation:  Sinus tachycardia No significant change since last  tracing Confirmed by Canary Brim  MD, MARTHA [phone]) on 07/21/2014 8:54:52 AM       1:21 PM This provider spoke with [name], PA-C from [place]. Reviewed CT of abdomen and pelvis. CCS will place orders for antibiotics and admit the patient.   MDM   Final diagnoses:  Intra-abdominal abscess  S/P ileostomy  Lower abdominal pain    Medications  ciprofloxacin (CIPRO) IVPB 400 mg (400 mg Intravenous New Bag/Given 07/21/14 1448)  metroNIDAZOLE (FLAGYL) IVPB 500 mg (not administered)  morphine 4 MG/ML injection 4 mg (not administered)  sodium chloride 0.9 % bolus 1,000 mL (0 mLs Intravenous Stopped  07/21/14 1124)  ondansetron (ZOFRAN) injection 4 mg (4 mg Intravenous Given 07/21/14 0906)  iohexol (OMNIPAQUE) 300 MG/ML solution 25 mL (25 mLs Oral Contrast Given 07/21/14 0953)  morphine 2 MG/ML injection 2 mg (2 mg Intravenous Given 07/21/14 1037)  ondansetron (ZOFRAN) injection 4 mg (4 mg Intravenous Given 07/21/14 1037)  iohexol (OMNIPAQUE) 300 MG/ML solution 100 mL (100 mLs Intravenous Contrast Given 07/21/14 1201)  sodium chloride 0.9 % bolus 1,000 mL (1,000 mLs Intravenous New Bag/Given 07/21/14 1318)    Filed Vitals:   07/21/14 1414 07/21/14 1430 07/21/14 1447 07/21/14 1500  BP: 109/69 105/68  117/71  Pulse: 108 105  107  Temp:   98.6 F (37 C)   TempSrc:   Oral   Resp: 14 13  10   SpO2: 99% 99%  98%   This provider reviewed the patient's chart. Patient had laparoscopic colectomy with ileostomy placement on 06/30/2014. Patient was seen in the ED setting on 07/06/2014 with perforated bowel admitted under the care of surgery. EKG sinus tachycardia with heart rate of 110 bpm without significant changes when compared to the last tracing. Troponin negative elevation. CBC noted elevated white blood cell count of  10.8. Hemoglobin 12.6, hematocrit 38.4. Elevated level of 678. CMP noted glucose of 125 with negative elevated anion gap. BUN, creatinine liver enzymes within normal limits. Lactic acid negative elevation. Lipase negative elevation. Urine pregnancy negative. Urinalysis negative for hemoglobin, nitrites, small leukocytes identified with a white blood cell count of 7-10, many squamous cells and bacteria noted-this appears to be a contaminated specimen. Urine culture pending. Plain film of acute abdomen chest no acute findings, no evidence of bowel obstruction or free air. CT abdomen and pelvis with contrast noted post partial resection of sigmoid colon with development of a proximally 5 cm perianastomotic abscess was small amount of air tracking along the left perirenal space to the level of the left  upper abdominal quadrant. Post right lower quadrant loop ileostomy creation without evidence of enteric structures. Patient presenting to the ED with a perianastomotic abscess noted on CT abdomen pelvis with contrast with elevated leukocytosis - negative signs of perforation or peritonitis. Patient started on IV fluids, IV antibiotics placed by [facility] surgery. Patient afebrile, not septic appearing. Patient seen and assessed by [facility] and admitted under the care of [facility]. Discussed plan for admission with patient who understands and agree to plan of care.   Jamse Mead, PA-C 07/21/14 [place], MD 07/22/14 [phone]

## 2014-07-22 ENCOUNTER — Inpatient Hospital Stay (HOSPITAL_COMMUNITY): Payer: BLUE CROSS/BLUE SHIELD

## 2014-07-22 LAB — BASIC METABOLIC PANEL
ANION GAP: 9 (ref 5–15)
BUN: <5 mg/dL — ABNORMAL LOW (ref 6–20)
CALCIUM: 9.1 mg/dL (ref 8.9–10.3)
CHLORIDE: 102 mmol/L (ref 101–111)
CO2: 26 mmol/L (ref 22–32)
Creatinine, Ser: 0.81 mg/dL (ref 0.44–1.00)
GFR calc Af Amer: >60 mL/min (ref >60)
GFR calc non Af Amer: >60 mL/min (ref >60)
GLUCOSE: 132 mg/dL — AB (ref 65–99)
Potassium: 3.9 mmol/L (ref 3.5–5.1)
Sodium: 137 mmol/L (ref 135–145)

## 2014-07-22 LAB — URINE CULTURE
Colony Count: 8000
SPECIAL REQUESTS: NORMAL

## 2014-07-22 LAB — CBC
HCT: 32.9 % — ABNORMAL LOW (ref 36.0–46.0)
HEMOGLOBIN: 10.5 g/dL — AB (ref 12.0–15.0)
MCH: 28.9 pg (ref 26.0–34.0)
MCHC: 31.9 g/dL (ref 30.0–36.0)
MCV: 90.6 fL (ref 78.0–100.0)
PLATELETS: 602 10*3/uL — AB (ref 150–400)
RBC: 3.63 MIL/uL — AB (ref 3.87–5.11)
RDW: 13.7 % (ref 11.5–15.5)
WBC: 8.2 10*3/uL (ref 4.0–10.5)

## 2014-07-22 MED ORDER — LIDOCAINE HCL 1 % IJ SOLN
INTRAMUSCULAR | Status: AC
  Filled 2014-07-22: qty 20

## 2014-07-22 MED ORDER — MIDAZOLAM HCL 2 MG/2ML IJ SOLN
INTRAMUSCULAR | Status: AC | PRN
  Administered 2014-07-22 (×2): 1 mg via INTRAVENOUS

## 2014-07-22 MED ORDER — MIDAZOLAM HCL 2 MG/2ML IJ SOLN
INTRAMUSCULAR | Status: AC
  Filled 2014-07-22: qty 4

## 2014-07-22 MED ORDER — FENTANYL CITRATE (PF) 100 MCG/2ML IJ SOLN
INTRAMUSCULAR | Status: AC
  Filled 2014-07-22: qty 4

## 2014-07-22 MED ORDER — FENTANYL CITRATE (PF) 100 MCG/2ML IJ SOLN
INTRAMUSCULAR | Status: AC | PRN
  Administered 2014-07-22 (×2): 50 ug via INTRAVENOUS

## 2014-07-22 NOTE — Progress Notes (Signed)
Subjective: Patient feels much better since being readmitted No nausea/ minimal LLQ pain Scheduled for IR drain placement today  Objective: Vital signs in last 24 hours: Temp:  [98.5 F (36.9 C)-99 F (37.2 C)] 98.5 F (36.9 C) (06/03 0523) Pulse Rate:  [87-114] 99 (06/03 0523) Resp:  [10-22] 18 (06/03 0523) BP: (102-141)/(48-89) 102/51 mmHg (06/03 0523) SpO2:  [98 %-100 %] 100 % (06/03 0523) Last BM Date: 07/21/14  Intake/Output from previous day: 06/02 0701 - 06/03 0700 In: 636.7 [P.O.:120; I.V.:516.7] Out: -  Intake/Output this shift:    General appearance: alert, cooperative and no distress GI: soft, mild LLQ tenderness Incisions - well-healed; ileostomy pink and functioning  Lab Results:   Recent Labs  07/21/14 0723 07/22/14 0558  WBC 10.8* 8.2  HGB 12.6 10.5*  HCT 38.4 32.9*  PLT 678* 602*   BMET  Recent Labs  07/21/14 0723 07/22/14 0558  NA 135 137  K 3.9 3.9  CL 98* 102  CO2 23 26  GLUCOSE 125* 132*  BUN 9 <5*  CREATININE 0.93 0.81  CALCIUM 10.0 9.1   PT/INR No results for input(s): LABPROT, INR in the last 72 hours. ABG No results for input(s): PHART, HCO3 in the last 72 hours.  Invalid input(s): PCO2, PO2  Studies/Results: Ct Abdomen Pelvis W Contrast  07/21/2014   CLINICAL DATA:  Post ileostomy creation (5/18), now with vomiting and lower abdominal pain near the incision site.  EXAM: CT ABDOMEN AND PELVIS WITH CONTRAST  TECHNIQUE: Multidetector CT imaging of the abdomen and pelvis was performed using the standard protocol following bolus administration of intravenous contrast.  CONTRAST:  140mL OMNIPAQUE IOHEXOL 300 MG/ML  SOLN  COMPARISON:  CT abdomen pelvis -07/06/2014 ; 05/20/2014  FINDINGS: Postsurgical change following partial resection of the sigmoid colon. There has been interval development of an approximately 1.9 x 3.5 x 5 cm cm peripherally enhancing fluid collection adjacent to the enteric anastomosis (axial image 60, series 2;  coronal image 39, series 5) with small amount of adjacent subcutaneous emphysema (image 57, series 2) with air seen tracking along the left perirenal space to the left upper abdominal quadrant (representative images 18, 21 and 30, series 2). No additional definable/drainable fluid collection.  The patient has undergone a loop ileostomy creation within the right lower abdominal quadrant. A minimal amount of enteric contrast material remains within the cecum however there is otherwise expected decompression of the remainder of the colon. No upstream distention of the small bowel to suggest enteric obstruction. No pneumatosis or portal venous gas.  Scattered mixed calcified and noncalcified atherosclerotic plaque within a normal caliber abdominal aorta. The major branch vessels of the abdominal aorta appear patent on this non CTA examination.  No bulky retroperitoneal, mesenteric, pelvic or inguinal lymphadenopathy.  Post hysterectomy. No discrete adnexal lesion. No free fluid in the pelvic cul-de-sac.  Normal hepatic contour. There is a minimal amount of focal fatty infiltration adjacent to the fissure for ligamentum teres. No discrete hepatic lesions. Normal appearance of the gallbladder. No radiopaque gallstones. No intra extrahepatic biliary ductal dilatation. No ascites.  There is symmetric enhancement and excretion of the bilateral kidneys. No renal stones on this postcontrast examination. No discrete renal lesions. No urinary obstruction or perinephric stranding. Normal appearance of the bilateral adrenal glands, pancreas and spleen.  Limited visualization the lower thorax demonstrates minimal dependent subpleural ground-glass atelectasis. No discrete focal airspace opacities. No pleural effusion.  Normal heart size.  No pericardial effusion.  No acute or aggressive osseous  abnormalities.  Regional soft tissues appear normal.  IMPRESSION: 1. Post partial resection of the sigmoid colon with development of an  approximately 5 cm perianastomotic abscess with small amount of air tracking along the left perirenal space to the level of the left upper abdominal quadrant. This abscess may be amenable to CT-guided percutaneous drainage catheter placement as indicated. 2. Post right lower quadrant loop ileostomy creation without evidence of enteric obstruction.   Electronically Signed   By: Sandi Mariscal M.D.   On: 07/21/2014 13:03   Dg Abd Acute W/chest  07/21/2014   CLINICAL DATA:  Nausea and vomiting.  EXAM: DG ABDOMEN ACUTE W/ 1V CHEST  COMPARISON:  CT of the abdomen and pelvis on 07/06/2014  FINDINGS: Minimal atelectasis at the right lung base. There is no evidence of pulmonary edema, consolidation, pneumothorax, nodule or pleural fluid. The heart size and mediastinal contours are normal.  Abdominal films show no evidence of bowel obstruction or ileus. Colostomy present in the right abdomen. No free intraperitoneal air identified. No abnormal calcifications. Visualized bony structures show facet hypertrophy in the lower lumbar spine.  IMPRESSION: No acute findings.  No evidence of bowel obstruction or free air.   Electronically Signed   By: Aletta Edouard M.D.   On: 07/21/2014 08:55    Anti-infectives: Anti-infectives    Start     Dose/Rate Route Frequency Ordered Stop   07/21/14 1445  ciprofloxacin (CIPRO) IVPB 400 mg     400 mg 200 mL/hr over 60 Minutes Intravenous Every 12 hours 07/21/14 1435     07/21/14 1445  metroNIDAZOLE (FLAGYL) IVPB 500 mg     500 mg 100 mL/hr over 60 Minutes Intravenous Every 8 hours 07/21/14 1435        Assessment/Plan: s/p * No surgery found * Pelvic abscess above colorectal anastomosis.    Patient has been diverted with ileostomy Will place drain today.   LOS: 1 day    Rosibel Giacobbe K. 07/22/2014

## 2014-07-22 NOTE — Consult Note (Signed)
Chief Complaint: Chief Complaint  Patient presents with  . Emesis  LLQ pain New abscess collection  Referring Physician(s): CCS  History of Present Illness: [name] is a 44 y.o. female   Hx sigmoid diverticulitis Colectomy 06/30/14 Required drain behind anastomosis then colostomy 5/18 Drain removed 5/23 Now with new abd pain CT reveals intra abdominal collection at anastomosis Request for drain by IR Dr Laurence Ferrari reviewed imaging and approved procedure I have seen and examined pt   Past Medical History  Diagnosis Date  . History of syphilis   . Breast fibroadenoma   . History of ectopic pregnancy 2005    r salpyngectomy  . History of conization of cervix 2000  . H/O myomectomy 07-2006    robotic  . History of hysterectomy, supracervical 08-2008    robotic  . PMS (premenstrual syndrome)   . Back pain   . H/O sinusitis   . GERD (gastroesophageal reflux disease)   . Hx of herpes simplex type 2 infection   . History of syphilis   . Abnormal Pap smear 2000    Cone Biopsy  . H/O metrorrhagia 12/2006  . Hypertension   . Headache(784.0)   . Diverticulitis of colon with perforation 05/20/2014    Past Surgical History  Procedure Laterality Date  . Cervical cone biopsy  2000  . Ectopic pregnancy surgery  2005    R salpyngectomy  . Myomectomy  2008    robotic  . Robotic assisted lap vaginal hysterectomy  2010    supracervical  . Polypectomy  2009  . Salpingectomy  2005  . Dilation and curettage of uterus  2009  . Abdominal hysterectomy    . Breast surgery  2004    fibroademoma  . Breast lumpectomy  10/11    rt-neg  . Trigger finger release Right 09/28/2012    Procedure: RELEASE TRIGGER FINGER/A-1 PULLEY RIGHT THUMB;  Surgeon: Jolyn Nap, MD;  Location: [place];  Service: Orthopedics;  Laterality: Right;  . Laparoscopic sigmoid colectomy N/A 06/30/2014    Procedure: LAPAROSCOPIC ASSISTED SIGMOID COLECTOMY;  Surgeon: Donnie Mesa, MD;  Location: [place];  Service: General;  Laterality: N/A;  . Colon resection N/A 07/06/2014    Procedure: LAPAROSCOPIC LOOP ILEOSTOMY WITH PLACEMENT OF PELVIC DRAIN;  Surgeon: Donnie Mesa, MD;  Location: [place];  Service: General;  Laterality: N/A;    Allergies: Keflex and Penicillins  Medications: Prior to Admission medications   Medication Sig Start Date End Date Taking? Authorizing Provider  ciprofloxacin (CIPRO) 500 MG tablet Take 1 tablet (500 mg total) by mouth 2 (two) times daily. 07/11/14  Yes Donnie Mesa, MD  esomeprazole (NEXIUM) 40 MG capsule Take 40 mg by mouth daily. 07/14/14  Yes Historical Provider, MD  Hydrocodone-Acetaminophen 5-300 MG TABS Take 1 tablet by mouth every 4 (four) hours as needed. pain 07/14/14  Yes Historical Provider, MD  ibuprofen (ADVIL,MOTRIN) 800 MG tablet Take 800 mg by mouth every 8 (eight) hours as needed for moderate pain.   Yes Historical Provider, MD  lisinopril (PRINIVIL,ZESTRIL) 10 MG tablet Take 10 mg by mouth daily.   Yes Historical Provider, MD  metroNIDAZOLE (FLAGYL) 500 MG tablet Take 1 tablet (500 mg total) by mouth every 8 (eight) hours. 07/11/14  Yes Donnie Mesa, MD  Multiple Vitamins-Minerals (HAIR/SKIN/NAILS/BIOTIN) TABS Take 2 tablets by mouth daily with breakfast.   Yes Historical Provider, MD     Family History  Problem Relation Age of Onset  . Hypertension Paternal Grandmother   .  Diabetes Paternal Grandmother   . Hypertension Maternal Grandmother   . Sarcoidosis Mother   . Hypertension Mother   . Thyroid disease Maternal Aunt   . Mental illness Cousin     History   Social History  . Marital Status: Married    Spouse Name: N/A  . Number of Children: N/A  . Years of Education: N/A   Social History Main Topics  . Smoking status: Former Smoker -- 0.25 packs/day for 15 years    Types: Cigarettes    Quit date: 06/12/2011  . Smokeless tobacco: Never Used  . Alcohol Use: 1.5 oz/week    3 Standard drinks or  equivalent per week  . Drug Use: No  . Sexual Activity: Yes   Other Topics Concern  . None   Social History Narrative     Review of Systems: A 12 point ROS discussed and pertinent positives are indicated in the HPI above.  All other systems are negative.  Review of Systems  Constitutional: Positive for activity change and appetite change. Negative for fever.  Respiratory: Negative for cough and shortness of breath.   Cardiovascular: Negative for chest pain.  Gastrointestinal: Positive for abdominal pain.  Psychiatric/Behavioral: Negative for behavioral problems and confusion.    Vital Signs: BP 102/51 mmHg  Pulse 99  Temp(Src) 98.5 F (36.9 C) (Oral)  Resp 18  SpO2 100%  Physical Exam  Constitutional: She is oriented to person, place, and time. She appears well-nourished.  Cardiovascular: Normal rate and regular rhythm.   Pulmonary/Chest: Effort normal and breath sounds normal. She has no wheezes.  Abdominal: Soft. Bowel sounds are normal. There is tenderness.  Musculoskeletal: Normal range of motion.  Neurological: She is alert and oriented to person, place, and time.  Skin: Skin is warm and dry.  Psychiatric: She has a normal mood and affect. Her behavior is normal. Judgment and thought content normal.  Nursing note and vitals reviewed.   Mallampati Score:  MD Evaluation Airway: WNL Heart: WNL Abdomen: WNL Chest/ Lungs: WNL ASA  Classification: 3 Mallampati/Airway Score: One  Imaging: Ct Abdomen Pelvis W Contrast  07/21/2014   CLINICAL DATA:  Post ileostomy creation (5/18), now with vomiting and lower abdominal pain near the incision site.  EXAM: CT ABDOMEN AND PELVIS WITH CONTRAST  TECHNIQUE: Multidetector CT imaging of the abdomen and pelvis was performed using the standard protocol following bolus administration of intravenous contrast.  CONTRAST:  129mL OMNIPAQUE IOHEXOL 300 MG/ML  SOLN  COMPARISON:  CT abdomen pelvis -07/06/2014 ; 05/20/2014  FINDINGS:  Postsurgical change following partial resection of the sigmoid colon. There has been interval development of an approximately 1.9 x 3.5 x 5 cm cm peripherally enhancing fluid collection adjacent to the enteric anastomosis (axial image 60, series 2; coronal image 39, series 5) with small amount of adjacent subcutaneous emphysema (image 57, series 2) with air seen tracking along the left perirenal space to the left upper abdominal quadrant (representative images 18, 21 and 30, series 2). No additional definable/drainable fluid collection.  The patient has undergone a loop ileostomy creation within the right lower abdominal quadrant. A minimal amount of enteric contrast material remains within the cecum however there is otherwise expected decompression of the remainder of the colon. No upstream distention of the small bowel to suggest enteric obstruction. No pneumatosis or portal venous gas.  Scattered mixed calcified and noncalcified atherosclerotic plaque within a normal caliber abdominal aorta. The major branch vessels of the abdominal aorta appear patent on this non CTA  examination.  No bulky retroperitoneal, mesenteric, pelvic or inguinal lymphadenopathy.  Post hysterectomy. No discrete adnexal lesion. No free fluid in the pelvic cul-de-sac.  Normal hepatic contour. There is a minimal amount of focal fatty infiltration adjacent to the fissure for ligamentum teres. No discrete hepatic lesions. Normal appearance of the gallbladder. No radiopaque gallstones. No intra extrahepatic biliary ductal dilatation. No ascites.  There is symmetric enhancement and excretion of the bilateral kidneys. No renal stones on this postcontrast examination. No discrete renal lesions. No urinary obstruction or perinephric stranding. Normal appearance of the bilateral adrenal glands, pancreas and spleen.  Limited visualization the lower thorax demonstrates minimal dependent subpleural ground-glass atelectasis. No discrete focal airspace  opacities. No pleural effusion.  Normal heart size.  No pericardial effusion.  No acute or aggressive osseous abnormalities.  Regional soft tissues appear normal.  IMPRESSION: 1. Post partial resection of the sigmoid colon with development of an approximately 5 cm perianastomotic abscess with small amount of air tracking along the left perirenal space to the level of the left upper abdominal quadrant. This abscess may be amenable to CT-guided percutaneous drainage catheter placement as indicated. 2. Post right lower quadrant loop ileostomy creation without evidence of enteric obstruction.   Electronically Signed   By: Sandi Mariscal M.D.   On: 07/21/2014 13:03   Ct Abdomen Pelvis W Contrast  07/06/2014   CLINICAL DATA:  Increasing left lower quadrant pain, fever and chills. Post sigmoid colectomy six days prior, discharged earlier today, increasing pain.  EXAM: CT ABDOMEN AND PELVIS WITH CONTRAST  TECHNIQUE: Multidetector CT imaging of the abdomen and pelvis was performed using the standard protocol following bolus administration of intravenous contrast.  CONTRAST:  125mL OMNIPAQUE IOHEXOL 300 MG/ML  SOLN  COMPARISON:  CT 05/20/2014  FINDINGS: Linear atelectasis in the right and left lower lobes.  Post sigmoid colectomy with enteric chain sutures in the sigmoid colon. There is a moderate volume of extraluminal air adjacent to the surgical bed, tracking in the retroperitoneum anterior to the psoas muscle. Only minimal fluid component is seen within the site, with minimal high-density material, likely complex fluid, medially. There is extraluminal air in the adjacent mesentery, with additional multi focal foci of free air in the anterior abdomen, upper abdomen, and presacral space. There is soft tissue induration at the surgical site. No extraluminal oral contrast is seen, however oral contrast is passes only to the cecum. There is a moderate volume of colonic stool. No drainable abscess.  Mild focal fatty  infiltration adjacent with falciform ligament. Liver is otherwise unremarkable. The gallbladder, spleen, pancreas, and adrenal glands are normal. There is symmetric renal enhancement and excretion. No hydronephrosis or localizing abnormality.  The abdominal aorta is normal in caliber with minimal atherosclerosis. There is a retroperitoneal air. No retroperitoneal adenopathy.  Within the pelvis the urinary bladder is physiologically distended. Patient is post hysterectomy. No adnexal mass. There is no pelvic adenopathy. No deep pelvic fluid collection. Presacral air and induration is noted.  Postsurgical change in the anterior abdominal wall with midline skin staples. No fluid collection. Scattered  There are no acute or suspicious osseous abnormalities. Degenerative change in the lumbar spine with primarily facet arthropathy.  IMPRESSION: Post recent sigmoid colon resection, with a moderate amount of free intra-abdominal air. This is greater than expected 6 days postoperative, and concerning for leak. There is no drainable fluid collection, the largest air pocket has a small complex fluid component at the surgical be, and is immediately adjacent to the  left iliac vasculature. Additional free air seen tracking in the retroperitoneum, mesenteric, upper abdomen, and presacral space.  These results were called by telephone at the time of interpretation on 07/06/2014 at 2:29 am to [place] , who verbally acknowledged these results.   Electronically Signed   By: Jeb Levering M.D.   On: 07/06/2014 02:30   Dg Abd Acute W/chest  07/21/2014   CLINICAL DATA:  Nausea and vomiting.  EXAM: DG ABDOMEN ACUTE W/ 1V CHEST  COMPARISON:  CT of the abdomen and pelvis on 07/06/2014  FINDINGS: Minimal atelectasis at the right lung base. There is no evidence of pulmonary edema, consolidation, pneumothorax, nodule or pleural fluid. The heart size and mediastinal contours are normal.  Abdominal films show no evidence of  bowel obstruction or ileus. Colostomy present in the right abdomen. No free intraperitoneal air identified. No abnormal calcifications. Visualized bony structures show facet hypertrophy in the lower lumbar spine.  IMPRESSION: No acute findings.  No evidence of bowel obstruction or free air.   Electronically Signed   By: Aletta Edouard M.D.   On: 07/21/2014 08:55    Labs:  CBC:  Recent Labs  07/08/14 0414 07/10/14 0501 07/21/14 0723 07/22/14 0558  WBC 15.7* 12.2* 10.8* 8.2  HGB 10.4* 10.2* 12.6 10.5*  HCT 32.4* 31.5* 38.4 32.9*  PLT 366 438* 678* 602*    COAGS:  Recent Labs  11/02/13 1044  INR 1.25  APTT 32    BMP:  Recent Labs  07/07/14 0550 07/08/14 0414 07/10/14 0501 07/21/14 0723  NA 135 136 137 135  K 3.8 3.6 3.5 3.9  CL 99* 103 102 98*  CO2 28 26 28 23   GLUCOSE 119* 111* 118* 125*  BUN <5* <5* <5* 9  CALCIUM 8.2* 8.2* 8.8* 10.0  CREATININE 0.97 0.91 0.65 0.93  GFRNONAA >60 >60 >60 >60  GFRAA >60 >60 >60 >60    LIVER FUNCTION TESTS:  Recent Labs  11/02/13 1044 02/04/14 1625 05/20/14 0008 07/21/14 0723  BILITOT 0.5 0.5 0.5 0.4  AST 23 18 25 20   ALT 20 15 21 14   ALKPHOS 40 44 41 45  PROT 7.1 8.0 7.5 8.1  ALBUMIN 3.6 4.2 4.3 3.9    TUMOR MARKERS: No results for input(s): AFPTM, CEA, CA199, CHROMGRNA in the last 8760 hours.  Assessment and Plan:  Diverticulitis Colectomy---colostomy Now with abscess collection at anastomosis Scheduled for drain placement Risks and Benefits discussed with the patient including bleeding, infection, damage to adjacent structures, bowel perforation/fistula connection, and sepsis. All of the patient's questions were answered, patient is agreeable to proceed. Consent signed and in chart.    Thank you for this interesting consult.  I greatly enjoyed meeting [name] and look forward to participating in their care.  Signed: Nat Lowenthal A 07/22/2014, 7:28 AM   I spent a total of 40 Minutes    in  face to face in clinical consultation, greater than 50% of which was counseling/coordinating care for abscess drain

## 2014-07-22 NOTE — Sedation Documentation (Signed)
Pt awake, alert, denies pain or discomfort at this time. Will continue to monitor.  Roselyn Reef Sidharth Leverette,RN

## 2014-07-22 NOTE — Care Management (Addendum)
Patient recently discharged with [facility] through [place] .  Discussed with  [name] CCS PA , will need resumption of care orders if [facility] needed at discharge .  CCS will assess need for [facility] and order if needed.  Santiago Glad with [place] aware.    UR completed.   Magdalen Spatz RN BSN [phone]

## 2014-07-22 NOTE — Procedures (Signed)
LLQ abscess aspiration 10 cc pus No comp

## 2014-07-23 NOTE — Progress Notes (Signed)
Patient ID: [name], female   DOB: March 05, 1970, 44 y.o.   MRN: [id number] [facility] Surgery Progress Note:   * No surgery found *  Subjective: Mental status is clear.  Taking regular diet.  Pain better.  Objective: Vital signs in last 24 hours: Temp:  [98.6 F (37 C)-98.8 F (37.1 C)] 98.8 F (37.1 C) (06/04 0525) Pulse Rate:  [90-98] 98 (06/04 0525) Resp:  [16] 16 (06/04 0525) BP: (113-133)/(66-77) 126/77 mmHg (06/04 0525) SpO2:  [100 %] 100 % (06/04 0525)  Intake/Output from previous day: 06/03 0701 - 06/04 0700 In: 2485 [P.O.:240; I.V.:1545; IV Piggyback:700] Out: -  Intake/Output this shift:    Physical Exam: Work of breathing is normal.  Not complaining of abdominal pain.  Ostomy functioning OK.  Lab Results:  Results for orders placed or performed during the hospital encounter of 07/21/14 (from the past 48 hour(s))  Basic metabolic panel     Status: Abnormal   Collection Time: 07/22/14  5:58 AM  Result Value Ref Range   Sodium 137 135 - 145 mmol/L   Potassium 3.9 3.5 - 5.1 mmol/L   Chloride 102 101 - 111 mmol/L   CO2 26 22 - 32 mmol/L   Glucose, Bld 132 (H) 65 - 99 mg/dL   BUN <5 (L) 6 - 20 mg/dL   Creatinine, Ser 0.81 0.44 - 1.00 mg/dL   Calcium 9.1 8.9 - 10.3 mg/dL   GFR calc non Af Amer >60 >60 mL/min   GFR calc Af Amer >60 >60 mL/min    Comment: (NOTE) The eGFR has been calculated using the CKD EPI equation. This calculation has not been validated in all clinical situations. eGFR's persistently <60 mL/min signify possible Chronic Kidney Disease.    Anion gap 9 5 - 15  CBC     Status: Abnormal   Collection Time: 07/22/14  5:58 AM  Result Value Ref Range   WBC 8.2 4.0 - 10.5 K/uL   RBC 3.63 (L) 3.87 - 5.11 MIL/uL   Hemoglobin 10.5 (L) 12.0 - 15.0 g/dL   HCT 32.9 (L) 36.0 - 46.0 %   MCV 90.6 78.0 - 100.0 fL   MCH 28.9 26.0 - 34.0 pg   MCHC 31.9 30.0 - 36.0 g/dL   RDW 13.7 11.5 - 15.5 %   Platelets 602 (H) 150 - 400 K/uL     Radiology/Results: Ct Abdomen Pelvis W Contrast  07/21/2014   CLINICAL DATA:  Post ileostomy creation (5/18), now with vomiting and lower abdominal pain near the incision site.  EXAM: CT ABDOMEN AND PELVIS WITH CONTRAST  TECHNIQUE: Multidetector CT imaging of the abdomen and pelvis was performed using the standard protocol following bolus administration of intravenous contrast.  CONTRAST:  134m OMNIPAQUE IOHEXOL 300 MG/ML  SOLN  COMPARISON:  CT abdomen pelvis -07/06/2014 ; 05/20/2014  FINDINGS: Postsurgical change following partial resection of the sigmoid colon. There has been interval development of an approximately 1.9 x 3.5 x 5 cm cm peripherally enhancing fluid collection adjacent to the enteric anastomosis (axial image 60, series 2; coronal image 39, series 5) with small amount of adjacent subcutaneous emphysema (image 57, series 2) with air seen tracking along the left perirenal space to the left upper abdominal quadrant (representative images 18, 21 and 30, series 2). No additional definable/drainable fluid collection.  The patient has undergone a loop ileostomy creation within the right lower abdominal quadrant. A minimal amount of enteric contrast material remains within the cecum however there is otherwise expected decompression  of the remainder of the colon. No upstream distention of the small bowel to suggest enteric obstruction. No pneumatosis or portal venous gas.  Scattered mixed calcified and noncalcified atherosclerotic plaque within a normal caliber abdominal aorta. The major branch vessels of the abdominal aorta appear patent on this non CTA examination.  No bulky retroperitoneal, mesenteric, pelvic or inguinal lymphadenopathy.  Post hysterectomy. No discrete adnexal lesion. No free fluid in the pelvic cul-de-sac.  Normal hepatic contour. There is a minimal amount of focal fatty infiltration adjacent to the fissure for ligamentum teres. No discrete hepatic lesions. Normal appearance of  the gallbladder. No radiopaque gallstones. No intra extrahepatic biliary ductal dilatation. No ascites.  There is symmetric enhancement and excretion of the bilateral kidneys. No renal stones on this postcontrast examination. No discrete renal lesions. No urinary obstruction or perinephric stranding. Normal appearance of the bilateral adrenal glands, pancreas and spleen.  Limited visualization the lower thorax demonstrates minimal dependent subpleural ground-glass atelectasis. No discrete focal airspace opacities. No pleural effusion.  Normal heart size.  No pericardial effusion.  No acute or aggressive osseous abnormalities.  Regional soft tissues appear normal.  IMPRESSION: 1. Post partial resection of the sigmoid colon with development of an approximately 5 cm perianastomotic abscess with small amount of air tracking along the left perirenal space to the level of the left upper abdominal quadrant. This abscess may be amenable to CT-guided percutaneous drainage catheter placement as indicated. 2. Post right lower quadrant loop ileostomy creation without evidence of enteric obstruction.   Electronically Signed   By: Sandi Mariscal M.D.   On: 07/21/2014 13:03   Ct Image Guided Drainage By Percutaneous Catheter  07/22/2014   CLINICAL DATA:  Left retroperitoneal abscess  EXAM: CT IMAGE GUIDED DRAINAGE BY NEEDLE  FLUOROSCOPY TIME:  None  MEDICATIONS AND MEDICAL HISTORY: Versed 1 mg, Fentanyl 100 mcg.  ANESTHESIA/SEDATION: Moderate sedation time: 12 minutes  CONTRAST:  None  PROCEDURE: The procedure, risks, benefits, and alternatives were explained to the patient. Questions regarding the procedure were encouraged and answered. The patient understands and consents to the procedure.  The left flank was prepped with Betadine in a sterile fashion, and a sterile drape was applied covering the operative field. A sterile gown and sterile gloves were used for the procedure.  Under CT guidance, an 18 gauge needle was inserted  into the retroperitoneal abscess. 10 cc pus was aspirated. Final imaging was performed.  FINDINGS: Images document needle placement in the left retroperitoneal abscess. Post aspiration images demonstrate resolution of the fluid collection.  COMPLICATIONS: None  IMPRESSION: Successful aspiration of a retroperitoneal fluid collection.   Electronically Signed   By: Marybelle Killings M.D.   On: 07/22/2014 17:34    Anti-infectives: Anti-infectives    Start     Dose/Rate Route Frequency Ordered Stop   07/21/14 1445  ciprofloxacin (CIPRO) IVPB 400 mg     400 mg 200 mL/hr over 60 Minutes Intravenous Every 12 hours 07/21/14 1435     07/21/14 1445  metroNIDAZOLE (FLAGYL) IVPB 500 mg     500 mg 100 mL/hr over 60 Minutes Intravenous Every 8 hours 07/21/14 1435        Assessment/Plan: Problem List: Patient Active Problem List   Diagnosis Date Noted  . Intra-abdominal abscess 07/21/2014  . Peritoneal free air 07/06/2014  . S/P laparoscopic colectomy 07/06/2014  . Fever 07/06/2014  . Sigmoid diverticulitis 06/30/2014  . Diverticulitis of colon with perforation 05/20/2014  . Lower abdominal pain 02/04/2014  . Sinus  tachycardia 02/04/2014  . Nausea and vomiting 02/04/2014  . Abdominal pain   . Diverticulitis 11/02/2013  . HTN (hypertension) 11/02/2013  . Leukocytosis 11/02/2013  . Acute renal failure 11/02/2013  . TOBACCO USER 11/26/2008    WBC is down today.  Afebrile.  Cultures and sensitivity pending.  On Flagyl.  Will continue observation for now.   * No surgery found *    LOS: 2 days   Matt B. Hassell Done, MD, [facility] [phone] beeper [phone]  07/23/2014 10:28 AM

## 2014-07-24 NOTE — Progress Notes (Signed)
Patient ID: [name], female   DOB: Jun 29, 1970, 44 y.o.   MRN: [id number] [facility] Surgery Progress Note:   * No surgery found *  Subjective: Mental status is clear.  Had pain in left side last night radiating into her back.  No fever Objective: Vital signs in last 24 hours: Temp:  [98.5 F (36.9 C)-98.7 F (37.1 C)] 98.5 F (36.9 C) (06/05 0636) Pulse Rate:  [92-100] 95 (06/05 0636) Resp:  [16-17] 16 (06/05 0636) BP: (127-131)/(73-80) 131/73 mmHg (06/05 0636) SpO2:  [100 %] 100 % (06/05 0636) Weight:  [85.8 kg (189 lb 2.5 oz)] 85.8 kg (189 lb 2.5 oz) (06/04 1600)  Intake/Output from previous day: 06/04 0701 - 06/05 0700 In: 3780 [P.O.:1080; I.V.:2000; IV Piggyback:700] Out: 120 [Stool:120] Intake/Output this shift:    Physical Exam: Work of breathing is normal.  Soreness on left side near aspiration site  Lab Results:  Results for orders placed or performed during the hospital encounter of 07/21/14 (from the past 48 hour(s))  Culture, routine-abscess     Status: None (Preliminary result)   Collection Time: 07/22/14 10:21 AM  Result Value Ref Range   Specimen Description ABSCESS ABDOMEN    Special Requests Normal    Gram Stain PENDING    Culture      Culture reincubated for better growth Performed at [facility]    Report Status PENDING     Radiology/Results: Ct Image Guided Drainage By Percutaneous Catheter  07/22/2014   CLINICAL DATA:  Left retroperitoneal abscess  EXAM: CT IMAGE GUIDED DRAINAGE BY NEEDLE  FLUOROSCOPY TIME:  None  MEDICATIONS AND MEDICAL HISTORY: Versed 1 mg, Fentanyl 100 mcg.  ANESTHESIA/SEDATION: Moderate sedation time: 12 minutes  CONTRAST:  None  PROCEDURE: The procedure, risks, benefits, and alternatives were explained to the patient. Questions regarding the procedure were encouraged and answered. The patient understands and consents to the procedure.  The left flank was prepped with Betadine in a sterile fashion, and a sterile  drape was applied covering the operative field. A sterile gown and sterile gloves were used for the procedure.  Under CT guidance, an 18 gauge needle was inserted into the retroperitoneal abscess. 10 cc pus was aspirated. Final imaging was performed.  FINDINGS: Images document needle placement in the left retroperitoneal abscess. Post aspiration images demonstrate resolution of the fluid collection.  COMPLICATIONS: None  IMPRESSION: Successful aspiration of a retroperitoneal fluid collection.   Electronically Signed   By: Marybelle Killings M.D.   On: 07/22/2014 17:34    Anti-infectives: Anti-infectives    Start     Dose/Rate Route Frequency Ordered Stop   07/21/14 1445  ciprofloxacin (CIPRO) IVPB 400 mg     400 mg 200 mL/hr over 60 Minutes Intravenous Every 12 hours 07/21/14 1435     07/21/14 1445  metroNIDAZOLE (FLAGYL) IVPB 500 mg     500 mg 100 mL/hr over 60 Minutes Intravenous Every 8 hours 07/21/14 1435        Assessment/Plan: Problem List: Patient Active Problem List   Diagnosis Date Noted  . Intra-abdominal abscess 07/21/2014  . S/P laparoscopic colectomy 07/06/2014  . HTN (hypertension) 11/02/2013  . TOBACCO USER 11/26/2008    Will check cbc in am.  If continued pain then may need repeat rad evaluation * No surgery found *    LOS: 3 days   Matt B. Hassell Done, MD, [facility] [phone] beeper [phone]  07/24/2014 9:26 AM

## 2014-07-24 NOTE — Progress Notes (Signed)
Pt c/o increased pain overnight, requiring more pain medicine. Pt states that pain feels like "gas pain" in lower abdomen. Pt also described sharp/aching pain to left side of abdomen and back overnight. Pt given oxycodone and morphine for pain. Will continue to monitor. Ronnette Hila, RN

## 2014-07-25 ENCOUNTER — Inpatient Hospital Stay (HOSPITAL_COMMUNITY): Payer: BLUE CROSS/BLUE SHIELD

## 2014-07-25 LAB — CBC WITH DIFFERENTIAL/PLATELET
BASOS PCT: 0 % (ref 0–1)
Basophils Absolute: 0 10*3/uL (ref 0.0–0.1)
EOS ABS: 1.4 10*3/uL — AB (ref 0.0–0.7)
Eosinophils Relative: 12 % — ABNORMAL HIGH (ref 0–5)
HEMATOCRIT: 32.3 % — AB (ref 36.0–46.0)
HEMOGLOBIN: 10.5 g/dL — AB (ref 12.0–15.0)
LYMPHS ABS: 3 10*3/uL (ref 0.7–4.0)
LYMPHS PCT: 25 % (ref 12–46)
MCH: 29.2 pg (ref 26.0–34.0)
MCHC: 32.5 g/dL (ref 30.0–36.0)
MCV: 90 fL (ref 78.0–100.0)
MONO ABS: 1.9 10*3/uL — AB (ref 0.1–1.0)
Monocytes Relative: 15 % — ABNORMAL HIGH (ref 3–12)
NEUTROS PCT: 48 % (ref 43–77)
Neutro Abs: 5.7 10*3/uL (ref 1.7–7.7)
Platelets: 507 10*3/uL — ABNORMAL HIGH (ref 150–400)
RBC: 3.59 MIL/uL — ABNORMAL LOW (ref 3.87–5.11)
RDW: 13.7 % (ref 11.5–15.5)
WBC: 12 10*3/uL — ABNORMAL HIGH (ref 4.0–10.5)

## 2014-07-25 MED ORDER — ENOXAPARIN SODIUM 40 MG/0.4ML ~~LOC~~ SOLN
40.0000 mg | SUBCUTANEOUS | Status: DC
  Filled 2014-07-25 (×2): qty 0.4

## 2014-07-25 NOTE — Progress Notes (Signed)
Patient ID: [name], female   DOB: October 31, 1970, 44 y.o.   MRN: [id number]    Subjective: Pt doesn't feel great.  C/o pain in left flank/chest with deep breathing.  Abdomen feels ok, but still with some pain in the LLQ.  Objective: Vital signs in last 24 hours: Temp:  [99.1 F (37.3 C)-100.3 F (37.9 C)] 99.1 F (37.3 C) (06/06 0605) Pulse Rate:  [106-109] 109 (06/06 0605) Resp:  [17] 17 (06/06 0605) BP: (110-120)/(62-65) 120/65 mmHg (06/06 0605) SpO2:  [100 %] 100 % (06/06 0605) Last BM Date: 07/24/14  Intake/Output from previous day: 06/05 0701 - 06/06 0700 In: 4160 [P.O.:960; I.V.:2500; IV Piggyback:700] Out: 275 [Stool:275] Intake/Output this shift:    PE: Abd: soft, still tender in LLQ, ostomy working well with good output, incision is well healed.   Heart: regular, but mildly tachy Lungs: CTAB  Lab Results:   Recent Labs  07/25/14 0408  WBC 12.0*  HGB 10.5*  HCT 32.3*  PLT 507*   BMET No results for input(s): NA, K, CL, CO2, GLUCOSE, BUN, CREATININE, CALCIUM in the last 72 hours. PT/INR No results for input(s): LABPROT, INR in the last 72 hours. CMP     Component Value Date/Time   NA 137 07/22/2014 0558   K 3.9 07/22/2014 0558   CL 102 07/22/2014 0558   CO2 26 07/22/2014 0558   GLUCOSE 132* 07/22/2014 0558   BUN <5* 07/22/2014 0558   CREATININE 0.81 07/22/2014 0558   CALCIUM 9.1 07/22/2014 0558   PROT 8.1 07/21/2014 0723   ALBUMIN 3.9 07/21/2014 0723   AST 20 07/21/2014 0723   ALT 14 07/21/2014 0723   ALKPHOS 45 07/21/2014 0723   BILITOT 0.4 07/21/2014 0723   GFRNONAA >60 07/22/2014 0558   GFRAA >60 07/22/2014 0558   Lipase     Component Value Date/Time   LIPASE 81* 07/21/2014 0723       Studies/Results: No results found.  Anti-infectives: Anti-infectives    Start     Dose/Rate Route Frequency Ordered Stop   07/21/14 1445  ciprofloxacin (CIPRO) IVPB 400 mg     400 mg 200 mL/hr over 60 Minutes Intravenous Every 12 hours  07/21/14 1435     07/21/14 1445  metroNIDAZOLE (FLAGYL) IVPB 500 mg     500 mg 100 mL/hr over 60 Minutes Intravenous Every 8 hours 07/21/14 1435         Assessment/Plan S/p lap assisted sigmoid colectomy with subsequent leak and diverting loop ileostomy, now with recurrent abscess, s/p aspiration on 07/22/2014 -cont diet and routine ostomy care -WBC up slightly to 12K.  Will follow -Cx are still pending -cont Cipro/Flagyl for now D4/?   Pleurisy -lungs sound good, but will get a chest x-ray today to further evaluate DVT prophylaxis -lovenox/SCDs  LOS: 4 days    Delio Slates E 07/25/2014, 8:35 AM Pager: [phone]

## 2014-07-25 NOTE — Care Management Note (Signed)
Case Management Note  Patient Details  Name: [name] MRN: [id number] Date of Birth: November 12, 1970  Subjective/Objective:                    Action/Plan:  UR updated  Expected Discharge Date:       07-25-14            Expected Discharge Plan:  Home/Self Care  In-House Referral:     Discharge planning Services     Post Acute Care Choice:    Choice offered to:     DME Arranged:    DME Agency:     HH Arranged:    [place] Agency:     Status of Service:     Medicare Important Message Given:    Date Medicare IM Given:    Medicare IM give by:    Date Additional Medicare IM Given:    Additional Medicare Important Message give by:     If discussed at [place] of Stay Meetings, dates discussed:    Additional Comments:  Marilu Favre, RN 07/25/2014, 2:56 PM

## 2014-07-26 DIAGNOSIS — Z87891 Personal history of nicotine dependence: Secondary | ICD-10-CM

## 2014-07-26 DIAGNOSIS — Z932 Ileostomy status: Secondary | ICD-10-CM

## 2014-07-26 DIAGNOSIS — B952 Enterococcus as the cause of diseases classified elsewhere: Secondary | ICD-10-CM

## 2014-07-26 DIAGNOSIS — K5732 Diverticulitis of large intestine without perforation or abscess without bleeding: Secondary | ICD-10-CM | POA: Diagnosis present

## 2014-07-26 DIAGNOSIS — K651 Peritoneal abscess: Secondary | ICD-10-CM

## 2014-07-26 DIAGNOSIS — Z8619 Personal history of other infectious and parasitic diseases: Secondary | ICD-10-CM

## 2014-07-26 LAB — CULTURE, ROUTINE-ABSCESS: Special Requests: NORMAL

## 2014-07-26 LAB — CBC
HCT: 29.7 % — ABNORMAL LOW (ref 36.0–46.0)
HEMOGLOBIN: 9.6 g/dL — AB (ref 12.0–15.0)
MCH: 29.4 pg (ref 26.0–34.0)
MCHC: 32.3 g/dL (ref 30.0–36.0)
MCV: 90.8 fL (ref 78.0–100.0)
PLATELETS: 446 10*3/uL — AB (ref 150–400)
RBC: 3.27 MIL/uL — AB (ref 3.87–5.11)
RDW: 13.8 % (ref 11.5–15.5)
WBC: 10.1 10*3/uL (ref 4.0–10.5)

## 2014-07-26 MED ORDER — SODIUM CHLORIDE 0.9 % IV SOLN: 250.0000 mL | INTRAVENOUS | Status: DC | PRN

## 2014-07-26 MED ORDER — SODIUM CHLORIDE 0.9 % IJ SOLN: 3.0000 mL | Freq: Two times a day (BID) | INTRAMUSCULAR | Status: DC

## 2014-07-26 MED ORDER — VANCOMYCIN HCL IN DEXTROSE 1-5 GM/200ML-% IV SOLN
1000.0000 mg | Freq: Three times a day (TID) | INTRAVENOUS | Status: DC
  Administered 2014-07-26 (×2): 1000 mg via INTRAVENOUS
  Filled 2014-07-26 (×3): qty 200

## 2014-07-26 MED ORDER — PHENOL 1.4 % MT LIQD: 2.0000 | OROMUCOSAL | Status: DC | PRN

## 2014-07-26 MED ORDER — GENTAMICIN IN SALINE 1.6-0.9 MG/ML-% IV SOLN
80.0000 mg | Freq: Three times a day (TID) | INTRAVENOUS | Status: DC
  Administered 2014-07-26 (×2): 80 mg via INTRAVENOUS
  Filled 2014-07-26 (×3): qty 50

## 2014-07-26 MED ORDER — BISMUTH SUBSALICYLATE 262 MG/15ML PO SUSP
30.0000 mL | Freq: Three times a day (TID) | ORAL | Status: DC | PRN
  Filled 2014-07-26: qty 118

## 2014-07-26 MED ORDER — LINEZOLID 600 MG PO TABS
600.0000 mg | ORAL_TABLET | Freq: Two times a day (BID) | ORAL | Status: DC
  Administered 2014-07-26 – 2014-07-27 (×2): 600 mg via ORAL
  Filled 2014-07-26 (×3): qty 1

## 2014-07-26 MED ORDER — ACETAMINOPHEN 500 MG PO TABS
1000.0000 mg | ORAL_TABLET | Freq: Three times a day (TID) | ORAL | Status: DC
  Administered 2014-07-26 (×3): 1000 mg via ORAL
  Filled 2014-07-26 (×3): qty 2

## 2014-07-26 MED ORDER — SACCHAROMYCES BOULARDII 250 MG PO CAPS
250.0000 mg | ORAL_CAPSULE | Freq: Two times a day (BID) | ORAL | Status: DC
  Administered 2014-07-26 – 2014-07-27 (×3): 250 mg via ORAL
  Filled 2014-07-26 (×4): qty 1

## 2014-07-26 MED ORDER — IBUPROFEN 400 MG PO TABS: 400.0000 mg | ORAL_TABLET | Freq: Four times a day (QID) | ORAL | Status: DC | PRN

## 2014-07-26 MED ORDER — LOPERAMIDE HCL 2 MG PO CAPS: 2.0000 mg | ORAL_CAPSULE | Freq: Three times a day (TID) | ORAL | Status: DC | PRN

## 2014-07-26 MED ORDER — MENTHOL 3 MG MT LOZG: 1.0000 | LOZENGE | OROMUCOSAL | Status: DC | PRN

## 2014-07-26 MED ORDER — BLISTEX MEDICATED EX OINT
1.0000 "application " | TOPICAL_OINTMENT | Freq: Two times a day (BID) | CUTANEOUS | Status: DC
  Administered 2014-07-26 – 2014-07-27 (×3): 1 via TOPICAL
  Filled 2014-07-26: qty 10

## 2014-07-26 MED ORDER — ALUM & MAG HYDROXIDE-SIMETH 200-200-20 MG/5ML PO SUSP: 30.0000 mL | Freq: Four times a day (QID) | ORAL | Status: DC | PRN

## 2014-07-26 MED ORDER — MAGIC MOUTHWASH: 15.0000 mL | Freq: Four times a day (QID) | ORAL | Status: DC | PRN

## 2014-07-26 MED ORDER — SODIUM CHLORIDE 0.9 % IJ SOLN: 3.0000 mL | INTRAMUSCULAR | Status: DC | PRN

## 2014-07-26 MED ORDER — LACTATED RINGERS IV BOLUS (SEPSIS): 1000.0000 mL | Freq: Three times a day (TID) | INTRAVENOUS | Status: DC | PRN

## 2014-07-26 MED ORDER — ONDANSETRON HCL 4 MG PO TABS
4.0000 mg | ORAL_TABLET | Freq: Three times a day (TID) | ORAL | Status: DC
  Administered 2014-07-26 – 2014-07-27 (×2): 4 mg via ORAL
  Filled 2014-07-26 (×2): qty 1

## 2014-07-26 MED ORDER — PROMETHAZINE HCL 25 MG/ML IJ SOLN: 6.2500 mg | INTRAMUSCULAR | Status: DC | PRN

## 2014-07-26 MED ORDER — METRONIDAZOLE IN NACL 5-0.79 MG/ML-% IV SOLN
500.0000 mg | Freq: Three times a day (TID) | INTRAVENOUS | Status: DC
  Administered 2014-07-26 – 2014-07-27 (×2): 500 mg via INTRAVENOUS
  Filled 2014-07-26 (×5): qty 100

## 2014-07-26 MED ORDER — HAIR/SKIN/NAILS/BIOTIN PO TABS: 2.0000 | ORAL_TABLET | Freq: Every day | ORAL | Status: DC

## 2014-07-26 NOTE — Progress Notes (Addendum)
[address] [place], [place] [address] Phone: [phone] FAX: [phone]   [name] [id number] 02/08/1971   Problem List:   Active Problems:   TOBACCO USER   HTN (hypertension)   Intra-abdominal abscess   Diverticulitis of sigmoid colon s/p colectomy 06/30/2014  Surgery 06/30/2014 Preop diagnosis: Recurrent sigmoid diverticulitis Postop diagnosis: Same Procedure performed: Laparoscopic assisted sigmoid colectomy Surgeon:TSUEI,MATTHEW K. Assistant: Dr. Jackolyn Confer  Surgery 07/06/2014 Preop diagnosis: Colorectal anastomotic leak Postop diagnosis: Same Procedure performed: Laparoscopic placement of pelvic drain and laparoscopic loop ileostomy Surgeon:TSUEI,MATTHEW K. Asst.: Dr. Georganna Skeans  Procedure 07/22/2014 Aspiration retroperitoneal abscess by Int Radiology      Assessment  Enterococcus abscess in setting of delayed anastomotic leak s/p proximal diversion  Plan:  -switch Abx Vanco given PCN allergy.  Add gent for synergy given complex infection/situation. -will need outpatient plan - find PO ABx alternative vs HH IV ABx -check CBC in AM -wean IVF -HTN control -inc PO pain control -ileostomy care/training -VTE prophylaxis- SCDs, etc -mobilize as tolerated to help recovery  -possible d/c soon if WBC more WNL & outpt ABx Tx plan.  I updated the patient's status to the patient.  Recommendations were made.  Questions were answered.  The patient expressed understanding & appreciation.   Adin Hector, M.D., F.A.C.S. Gastrointestinal and Minimally Invasive Surgery [facility] [address] [place], [place] [address] [phone] Main / Paging   07/26/2014  Subjective:  Sore Walking  tol some solids  Objective:  Vital signs:  Filed Vitals:   07/25/14 0605 07/25/14 1345 07/25/14 2209 07/26/14 0620  BP: 120/65 113/82 112/63 127/64  Pulse:  109 108 107 94  Temp: 99.1 F (37.3 C) 99.6 F (37.6 C) 98.7 F (37.1 C) 98.8 F (37.1 C)  TempSrc:  Oral Oral Oral  Resp: 17 18 18 17   Height:      Weight:      SpO2: 100% 100% 98% 100%    Last BM Date: 07/24/14  Intake/Output   Yesterday:  06/06 0701 - 06/07 0700 In: 1287 [P.O.:680; I.V.:2012; IV Piggyback:700] Out: 460 [Stool:460] This shift:     Bowel function:  Flatus: y  BM: thin succus in bag  Drain: none  Physical Exam:  General: Pt awake/alert/oriented x4 in no acute distress Eyes: PERRL, normal EOM.  Sclera clear.  No icterus Neuro: CN II-XII intact w/o focal sensory/motor deficits. Lymph: No head/neck/groin lymphadenopathy Psych:  No delerium/psychosis/paranoia HENT: Normocephalic, Mucus membranes moist.  No thrush Neck: Supple, No tracheal deviation Chest: No chest wall pain w good excursion CV:  Pulses intact.  Regular rhythm MS: Normal AROM mjr joints.  No obvious deformity Abdomen: Soft.  Nondistended.  Mildly tender at LLQ only.  Ileostomy pink + gas/succus.  Incision closed.  No evidence of peritonitis.  No incarcerated hernias. Ext:  SCDs BLE.  No mjr edema.  No cyanosis Skin: No petechiae / purpura  Results:   Labs: Results for orders placed or performed during the hospital encounter of 07/21/14 (from the past 48 hour(s))  CBC with Differential/Platelet     Status: Abnormal   Collection Time: 07/25/14  4:08 AM  Result Value Ref Range   WBC 12.0 (H) 4.0 - 10.5 K/uL   RBC 3.59 (L) 3.87 - 5.11 MIL/uL   Hemoglobin 10.5 (L) 12.0 - 15.0 g/dL   HCT 32.3 (L) 36.0 - 46.0 %   MCV 90.0 78.0 - 100.0 fL  MCH 29.2 26.0 - 34.0 pg   MCHC 32.5 30.0 - 36.0 g/dL   RDW 13.7 11.5 - 15.5 %   Platelets 507 (H) 150 - 400 K/uL   Neutrophils Relative % 48 43 - 77 %   Neutro Abs 5.7 1.7 - 7.7 K/uL   Lymphocytes Relative 25 12 - 46 %   Lymphs Abs 3.0 0.7 - 4.0 K/uL   Monocytes Relative 15 (H) 3 - 12 %   Monocytes Absolute 1.9 (H) 0.1 - 1.0 K/uL   Eosinophils  Relative 12 (H) 0 - 5 %   Eosinophils Absolute 1.4 (H) 0.0 - 0.7 K/uL   Basophils Relative 0 0 - 1 %   Basophils Absolute 0.0 0.0 - 0.1 K/uL  CBC     Status: Abnormal   Collection Time: 07/25/14 11:58 PM  Result Value Ref Range   WBC 10.1 4.0 - 10.5 K/uL   RBC 3.27 (L) 3.87 - 5.11 MIL/uL   Hemoglobin 9.6 (L) 12.0 - 15.0 g/dL   HCT 29.7 (L) 36.0 - 46.0 %   MCV 90.8 78.0 - 100.0 fL   MCH 29.4 26.0 - 34.0 pg   MCHC 32.3 30.0 - 36.0 g/dL   RDW 13.8 11.5 - 15.5 %   Platelets 446 (H) 150 - 400 K/uL    Imaging / Studies: Dg Chest Port 1 View  07/25/2014   CLINICAL DATA:  Pleurisy  EXAM: PORTABLE CHEST - 1 VIEW  COMPARISON:  07/21/2014  FINDINGS: Decreasing lung volumes with left basilar atelectasis. Right lung is clear. Heart is normal size. No effusions or acute bony abnormality.  IMPRESSION: Left base atelectasis.   Electronically Signed   By: Rolm Baptise M.D.   On: 07/25/2014 09:26   Dg Abd 2 Views  07/25/2014   CLINICAL DATA:  Abdominal pain .  EXAM: ABDOMEN - 2 VIEW  COMPARISON:  07/21/2014.  CT 07/21/2014.  FINDINGS: Soft tissue structures are unremarkable. No bowel distention. Oral contrast in the colon. No free air identified. Right lower quadrant ostomy noted.  IMPRESSION: No acute abnormality identified. No bowel distention or free air noted.   Electronically Signed   By: Marcello Moores  Register   On: 07/25/2014 09:51    Medications / Allergies: per chart  Antibiotics: Anti-infectives    Start     Dose/Rate Route Frequency Ordered Stop   07/21/14 1445  ciprofloxacin (CIPRO) IVPB 400 mg     400 mg 200 mL/hr over 60 Minutes Intravenous Every 12 hours 07/21/14 1435     07/21/14 1445  metroNIDAZOLE (FLAGYL) IVPB 500 mg     500 mg 100 mL/hr over 60 Minutes Intravenous Every 8 hours 07/21/14 1435          Note: Portions of this report may have been transcribed using voice recognition software. Every effort was made to ensure accuracy; however, inadvertent computerized transcription  errors may be present.   Any transcriptional errors that result from this process are unintentional.     Adin Hector, M.D., F.A.C.S. Gastrointestinal and Minimally Invasive Surgery [facility] [address] [place], [place] [address] [phone] Main / Paging   07/26/2014  CARE TEAM:  PCP: Freddie Apley, MD  Outpatient Care Team: Patient Care Team: Freddie Apley, MD as PCP - General  Inpatient Treatment Team: Treatment Team: Attending Provider: Nolon Nations, MD; Consulting Physician: Nolon Nations, MD; Rounding Team: Donnie Mesa, MD; Registered Nurse: Suzan Nailer, RN; Registered Nurse: Kennith Center, RN;  Technician: Estella Husk, NT; Registered Nurse: Clint Lipps, RN; Technician: Francene Finders, NT; Registered Nurse: Ainsley Spinner, RN; Technician: Lindajo Royal, NT; Respiratory Therapist: Delaney Meigs, RRT; Registered Nurse: Charisse March, RN; Registered Nurse: Micki Riley, RN; Registered Nurse: Donzetta Matters, RN

## 2014-07-26 NOTE — Care Management Note (Signed)
Case Management Note  Patient Details  Name: [name] MRN: [id number] Date of Birth: 02/23/1970  Subjective/Objective:                    Action/Plan:   Expected Discharge Date:       07-29-14           Expected Discharge Plan:  Home/Self Care  In-House Referral:     Discharge planning Services     Post Acute Care Choice:    Choice offered to:     DME Arranged:    DME Agency:     HH Arranged:    [place] Agency:     Status of Service:     Medicare Important Message Given:    Date Medicare IM Given:    Medicare IM give by:    Date Additional Medicare IM Given:    Additional Medicare Important Message give by:     If discussed at [place] of Stay Meetings, dates discussed:  07-26-14  Additional Comments:  Marilu Favre, RN 07/26/2014, 10:20 AM

## 2014-07-26 NOTE — Discharge Instructions (Signed)
Ostomy Support Information  Yes, Theresia Majors heard that people get along just fine with only one of their eyes, or one of their lungs, or one of their kidneys. But you also know that you have only one intestine and only one bladder, and that leaves you feeling awfully empty, both physically and emotionally: You think no other people go around without part of their intestine with the ends of their intestines sticking out through their abdominal walls.  Well, you are wrong! There are nearly three quarters of a million people in the [place] who have an ostomy; people who have had surgery to remove all or part of their colons or bladders. There is even a national association, the [place] [employer] [place] with over 350 local affiliated support groups that are organized by volunteers who provide peer support and counseling. [name] has a toll free telephone num-ber, ([phone] and an educational,  interactive website, www.ostomy.org   An ostomy is an opening in the belly (abdominal wall) made by surgery. Ostomates are people who have had this procedure. The opening (stoma) allows the kidney or bowel to discharge waste. An external pouch covers the stoma to collect waste. Pouches are are a simple bag and are odor free. Different companies have disposable or reusable pouches to fit one's lifestyle. An ostomy can either be temporary or permanent.  THERE ARE THREE MAIN TYPES OF OSTOMIES  Colostomy. A colostomy is a surgically created opening in the large intestine (colon).  Ileostomy. An ileostomy is a surgically created opening in the small intestine.  Urostomy. A urostomy is a surgically created opening to divert urine away from the bladder. FREQUENTLY ASKED QUESTIONS   Why havent you met any of these folks who have an ostomy?  Well, maybe you have! You just did not recognize them because an ostomy doesn't show. It can be kept secret if you wish. Why, maybe some of your best friends, office associates  or neighbors have an ostomy ... you never can tell.   People facing ostomy surgery have many quality-of-life questions like:  Will you bulge? Smell? Make noises? Will you feel waste leaving your body? Will you be a captive of the toilet? Will you starve? Be a social outcast? Get/stay married? Have babies? Easily bathe, go swimming, bend over?  OK, lets look at what you can expect:  Will you bulge?  Remember, without part of the intestine or bladder, and its contents, you should have a flatter tummy than before. You can expect to wear, with little exception, what you wore before surgery ... and this in-cludes tight clothing and bathing suits.  Will you smell?  Today, thanks to modern odor proof pouching systems, you can walk into an ostomy support group meeting and not smell anything that is foul or offensive. And, for those with an ileostomy or colostomy who are concerned about odor when emptying their pouch, there are in-pouch deodorants that can be used to eliminate any waste odors that may exist.  Will you make noises?  Everyone produces gas, especially if they are an air-swallower. But intestinal sounds that occur from time to time are no differ-ent than a gurgling tummy, and quite often your clothing will muffle any sounds.   Will you feel the waste discharges?  For those with a colostomy or ileostomy there might be a slight pressure when waste leaves your body, but understand that the intestines have no nerve endings, so there will be no unpleasant sensations. Those with a urostomy will  probably be unaware of any kidney drainage.  Will you be a captive of the toilet?  Immediately post-op you will spend more time in the bathroom than you will after your body recovers from surgery. Every person is different, but on average those with an ileostomy or urostomy may empty their pouches 4 to 6 times a day; a little  less if you have a colostomy. The average wear time between pouch system changes is 3  to 5 days and the changing process should take less than 30 minutes.  Will I need to be on a special diet? Most people return to their normal diet when they have recovered from surgery. Be sure to chew your food well, eat a well-balanced diet and drink plenty of fluids. If you experience problems with a certain food, wait a couple of weeks and try it again. Will there be odor and noises? Pouching systems are designed to be odor-proof or odor-resistant. There are deodorants that can be used in the pouch. Medications are also available to help reduce odor. Limit gas-producing foods and carbonated beverages. You will experience less gas and fewer noises as you heal from surgery. How much time will it take to care for my ostomy? At first, you may spend a lot of time learning about your ostomy and how to take care of it. As you become more comfortable and skilled at changing the pouching system, it will take very little time to care for it.  Will I be able to return to work? People with ostomies can perform most jobs. As soon as you have healed from surgery, you should be able to return to work. Heavy lifting (more than 10 pounds) may be discouraged.  What about intimacy? Sexual relationships and intimacy are important and fulfilling aspects of your life. They should continue after ostomy surgery. Intimacy-related concerns should be discussed openly between you and your partner.  Can I wear regular clothing? You do not need to wear special clothing. Ostomy pouches are fairly flat and barely noticeable. Elastic undergarments will not hurt the stoma or prevent the ostomy from functioning.  Can I participate in sports? An ostomy should not limit your involvement in sports. Many people with ostomies are runners, skiers, swimmers or participate in other active lifestyles. Talk with your caregiver first before doing heavy physical activity.  Will you starve?  Not if you follow doctors orders at each stage of  your post-op adjustment. There is no such thing as an ostomy diet. Some people with an ostomy will be able to eat and tolerate anything; others may find diffi-culty with some foods. Each person is an individual and must determine, by trial, what is best for them. A good practice for all is to drink plenty of water.  Will you be a social outcast?  Have you met anyone who has an ostomy and is a social outcast? Why should you be the first? Only your attitude and self image will effect how you are treated. No confi-dent person is an Occupational psychologist.   PROFESSIONAL HELP  Resources are available if you need help or have questions about your ostomy.    Specially trained nurses called Wound, Ostomy Continence Nurses (WOCN) are available for consultation in most major medical centers.   Consider getting an ostomy consult with [name] at [facility] to help troubleshoot stoma pouch fittings and other issues with your ostomy: [phone]   The [place] (UOA) is a group made up of many  local chapters throughout the [place]. These local groups hold meetings and provide support to prospective and existing ostomates. They sponsor educational events and have qualified visitors to make personal or telephone visits. Contact the UOA for the chapter nearest you and for other educational publications.  More detailed information can be found in Colostomy Guide, a publication of the [employer] (UOA). Contact UOA at 1-251-662-0898 or visit their web site at [email or web address] The website contains links to other sites, suppliers and resources. Document Released: 02/07/2003 Document Revised: 04/29/2011 Document Reviewed: 06/08/2008 [facility] Patient Information 2013 [place].  GETTING TO GOOD BOWEL HEALTH. Irregular bowel habits such as constipation and diarrhea can lead to many problems over time.  Having one soft bowel movement a day is the most important way to  prevent further problems.  The anorectal canal is designed to handle stretching and feces to safely manage our ability to get rid of solid waste (feces, poop, stool) out of our body.  BUT, hard constipated stools can act like ripping concrete bricks and diarrhea can be a burning fire to this very sensitive area of our body, causing inflamed hemorrhoids, anal fissures, increasing risk is perirectal abscesses, abdominal pain/bloating, an making irritable bowel worse.      The goal: ONE SOFT BOWEL MOVEMENT A DAY!  To have soft, regular bowel movements:   Drink plenty of fluids, consider 4-6 tall glasses of water a day.    Take plenty of fiber.  Fiber is the undigested part of plant food that passes into the colon, acting s natures broom to encourage bowel motility and movement.  Fiber can absorb and hold large amounts of water. This results in a larger, bulkier stool, which is soft and easier to pass. Work gradually over several weeks up to 6 servings a day of fiber (25g a day even more if needed) in the form of: o Vegetables -- Root (potatoes, carrots, turnips), leafy green (lettuce, salad greens, celery, spinach), or cooked high residue (cabbage, broccoli, etc) o Fruit -- Fresh (unpeeled skin & pulp), Dried (prunes, apricots, cherries, etc ),  or stewed ( applesauce)  o Whole grain breads, pasta, etc (whole wheat)  o Bran cereals   Bulking Agents -- This type of water-retaining fiber generally is easily obtained each day by one of the following:  o Psyllium bran -- The psyllium plant is remarkable because its ground seeds can retain so much water. This product is available as Metamucil, Konsyl, Effersyllium, Per Diem Fiber, or the less expensive generic preparation in drug and health food stores. Although labeled a laxative, it really is not a laxative.  o Methylcellulose -- This is another fiber derived from wood which also retains water. It is available as Citrucel. o Polyethylene Glycol - and  artificial fiber commonly called Miralax or Glycolax.  It is helpful for people with gassy or bloated feelings with regular fiber o Flax Seed - a less gassy fiber than psyllium  No reading or other relaxing activity while on the toilet. If bowel movements take longer than 5 minutes, you are too constipated  AVOID CONSTIPATION.  High fiber and water intake usually takes care of this.  Sometimes a laxative is needed to stimulate more frequent bowel movements, but   Laxatives are not a good long-term solution as it can wear the colon out.  They can help jump-start bowels if constipated, but should be relied on constantly without discussing with your doctor o Osmotics (Milk of Magnesia, Fleets phosphosoda, Magnesium  citrate, MiraLax, GoLytely) are safer than  o Stimulants (Senokot, Castor Oil, Dulcolax, Ex Lax)    o Avoid taking laxatives for more than 7 days in a row.   IF SEVERELY CONSTIPATED, try a Bowel Retraining Program: o Do not use laxatives.  o Eat a diet high in roughage, such as bran cereals and leafy vegetables.  o Drink six (6) ounces of prune or apricot juice each morning.  o Eat two (2) large servings of stewed fruit each day.  o Take one (1) heaping tablespoon of a psyllium-based bulking agent twice a day. Use sugar-free sweetener when possible to avoid excessive calories.  o Eat a normal breakfast.  o Set aside 15 minutes after breakfast to sit on the toilet, but do not strain to have a bowel movement.  o If you do not have a bowel movement by the third day, use an enema and repeat the above steps.   Controlling diarrhea o Switch to liquids and simpler foods for a few days to avoid stressing your intestines further. o Avoid dairy products (especially milk & ice cream) for a short time.  The intestines often can lose the ability to digest lactose when stressed. o Avoid foods that cause gassiness or bloating.  Typical foods include beans and other legumes, cabbage, broccoli, and  dairy foods.  Every person has some sensitivity to other foods, so listen to our body and avoid those foods that trigger problems for you. o Adding fiber (Citrucel, Metamucil, psyllium, Miralax) gradually can help thicken stools by absorbing excess fluid and retrain the intestines to act more normally.  Slowly increase the dose over a few weeks.  Too much fiber too soon can backfire and cause cramping & bloating. o Probiotics (such as active yogurt, Align, etc) may help repopulate the intestines and colon with normal bacteria and calm down a sensitive digestive tract.  Most studies show it to be of mild help, though, and such products can be costly. o Medicines: - Bismuth subsalicylate (ex. Kayopectate, Pepto Bismol) every 30 minutes for up to 6 doses can help control diarrhea.  Avoid if pregnant. - Loperamide (Immodium) can slow down diarrhea.  Start with two tablets ($RemoveBefo'4mg'[id number]$  total) first and then try one tablet every 6 hours.  Avoid if you are having fevers or severe pain.  If you are not better or start feeling worse, stop all medicines and call your doctor for advice o Call your doctor if you are getting worse or not better.  Sometimes further testing (cultures, endoscopy, X-ray studies, bloodwork, etc) may be needed to help diagnose and treat the cause of the diarrhea.  TROUBLESHOOTING IRREGULAR BOWELS 1) Avoid extremes of bowel movements (no bad constipation/diarrhea) 2) Miralax 17gm mixed in 8oz. water or juice-daily. May use BID as needed.  3) Gas-x,Phazyme, etc. as needed for gas & bloating.  4) Soft,bland diet. No spicy,greasy,fried foods.  5) Prilosec over-the-counter as needed  6) May hold gluten/wheat products from diet to see if symptoms improve.  7)  May try probiotics (Align, Activa, etc) to help calm the bowels down 7) If symptoms become worse call back immediately.  Ileostomy Home Guide An ileostomy is an opening for stool to leave your body when a medical condition prevents it from  leaving through the usual opening (rectum). During a surgery, a piece of small intestine (ileum) is brought through a hole in the abdominal wall. The new opening is called a stoma or ostomy. A bag or pouch fits over the  stoma to catch stool and gas. Your stool may be liquid at first. Over time, it may become about the consistency of applesauce. CARING FOR YOUR STOMA Normally, the stoma looks a lot like the inside of your cheek: pink and moist. At first, it may be swollen, but this swelling will decrease within 6 weeks. Keep the skin around the stoma clean and dry. You can gently wash your stoma in the shower with a clean, soft washcloth. If you develop any skin irritation, your caregiver may give you a stoma powder or ointment to help heal the area. Do not use any products other than those specifically given to you by your caregiver.  Your stoma should not be uncomfortable. If you notice any stinging or burning, your pouch may be leaking, and the skin around your stoma may be coming into contact with stool. This can cause skin irritation. If you notice stinging, you should replace your pouch with a new one and discard the old one. OSTOMY POUCHES The pouch that fits over the ostomy can be made up of either 1 or 2 pieces. A one-piece pouch has a skin barrier piece and the pouch itself in one unit. A two-piece pouch has a skin barrier with a separate pouch that snaps on and off of the skin barrier. Either way, you should empty the pouch when it is only  to  full. Do not let more stool or gas build up. This could cause the pouch to leak. Some ostomy bags have a built-in gas release valve. Ostomy deodorizer (5 drops) can be put into the pouch to prevent odor. Some people use an ostomy lubricant to help the stool slide out of the bag more easily and completely.  EMPTYING YOUR OSTOMY POUCH You may get lessons on how to empty your pouch from a wound-ostomy nurse before you leave the hospital. Here are the basic  steps:  Wash your hands with soap and water.  Sit far back on the toilet.  Put pieces of toilet paper into the toilet water. This will prevent splashing as you empty the stool into the toilet bowl.  Unclip or unvelcro the tail end of the pouch.  Unroll the tail and empty stool into the toilet.  Clean the tail with toilet paper.  Reroll the tail, and clip or velcro it closed.  Wash your hands again. CHANGING YOUR OSTOMY POUCH Change your ostomy pouch about every 3 to 4 days for the first 6 weeks, then every 5 to 7 days. Always change the bag sooner if you begin to notice any discomfort or irritation of the skin around the stoma. When possible, plan to change your ostomy pouching system before eating and drinking because this will lessen the chance of stool coming out during the change. A wound-ostomy nurse may teach you how to change your pouch before you leave the hospital. Here are the basic steps:  Lay out your supplies.  Wash your hands with soap and water.  Carefully remove the old pouch.  Wash the stoma and the skin around the stoma. Allow the area to dry. Men may be advised to shave any hair around the stoma very carefully. This will make the adhesive stick better.  Use the stoma measuring guide that comes with your pouch set to decide what size hole you will need to cut in the skin barrier piece. Choose the smallest possible size that will hold the stoma but will not touch it.  Use the guide to trace  the circle on the back of the skin barrier piece. Cut out the hole.  Hold the skin barrier piece over the stoma to make sure the hole is the correct size.  Remove the adhesive paper backing from the skin barrier piece.  Squeeze stoma paste around the opening of the skin barrier piece.  Clean and dry the skin around the stoma again.  Carefully fit the skin barrier piece over your stoma.  If you are using a two-piece pouch, snap the pouch onto the skin barrier  piece.  Close the tail of the pouch.  Put your hand over the top of the skin barrier piece to help warm it for about 5 minutes, so that it conforms to your body better.  Wash your hands again. DIET TIPS Because you have a higher risk of a blockage in the first 2 months after getting an ileostomy, you should decrease your fiber intake for that time period. Fibrous foods include:  Celery.  Cabbage (and coleslaw).  Pineapple.  Mushrooms.  Corn and popcorn.  Whole fruits and vegetables, especially with the skins on.  Foods that contain seeds.  Oranges, grapefruit, tangerines, and other citrus fruit.  Nuts. After about 2 months, you can slowly add these kinds of foods back into your regular diet, as tolerated. Other tips:  Drink about eight, 8 oz glasses of water each day.  You can prevent gas by eating slowly and chewing your food thoroughly.  If you feel concerned that you have too much gas, you can cut back on gas-producing foods, such as:  Spicy foods.  Onions and garlic.  Cruciferous vegetables (cabbage, broccoli, cauliflower, Brussels sprouts).  Beans and legumes.  Some cheeses.  Eggs.  Fish.  Bubbly (carbonated) drinks.  Chewing gum. GENERAL TIPS  You can shower with or without the bag in place.  Always keep the bag snapped on if you are bathing or swimming.  If your bag gets wet, you can dry it with a blow-dryer set to cool.  Avoid wearing tight clothing directly over your stoma so that it does not become irritated or bleed. Tight clothing can also prevent the stool from draining into the pouch, which can cause it to leak.  It is helpful to always have an extra skin barrier and pouch with you when traveling. Do not leave them anywhere too warm, because parts of them can melt.  Do not let your seat belt rest on your stoma. Try to keep the seat belt either above or below your stoma, or use a tiny pillow to cushion it.  You can still participate in  sports, but you should avoid activities in which there is a risk of getting hit in the abdomen.  You can still have sex. It is a good idea to empty your pouch prior to sex. Some people and their partners feel very comfortable seeing the pouch during sex. Others choose to wear lingerie or a T-shirt that covers the device. SEEK IMMEDIATE MEDICAL CARE IF:  You feel dizzy, light-headed, or faint.  You measure pouch drainage of more than 1,500 mL per day. This amount of drainage can lead to dehydration.  You notice a change in the size or color of the stoma, especially if it becomes very red, purple, black, or pale white.  You have bloody stools or bleeding from the ostomy.  You have abdominal pain, nausea, vomiting, or bloating.  There is anything unusual protruding from the ostomy.  You have irritation or red skin  around the ostomy.  No stool is passing from the stoma.  You have diarrhea (requiring pouch emptying more frequently than normal). Document Released: 02/07/2003 Document Revised: 06/21/2013 Document Reviewed: 07/04/2010 [facility] Patient Information 2015 [place], [place]. This information is not intended to replace advice given to you by your health care provider. Make sure you discuss any questions you have with your health care provider.  Food Choices to Help Relieve Diarrhea When you have diarrhea, the foods you eat and your eating habits are very important. Choosing the right foods and drinks can help relieve diarrhea. Also, because diarrhea can last up to 7 days, you need to replace lost fluids and electrolytes (such as sodium, potassium, and chloride) in order to help prevent dehydration.  WHAT GENERAL GUIDELINES DO I NEED TO FOLLOW?  Slowly drink 1 cup (8 oz) of fluid for each episode of diarrhea. If you are getting enough fluid, your urine will be clear or pale yellow.  Eat starchy foods. Some good choices include white rice, white toast, pasta, low-fiber cereal, baked  potatoes (without the skin), saltine crackers, and bagels.  Avoid large servings of any cooked vegetables.  Limit fruit to two servings per day. A serving is  cup or 1 small piece.  Choose foods with less than 2 g of fiber per serving.  Limit fats to less than 8 tsp (38 g) per day.  Avoid fried foods.  Eat foods that have probiotics in them. Probiotics can be found in certain dairy products.  Avoid foods and beverages that may increase the speed at which food moves through the stomach and intestines (gastrointestinal tract). Things to avoid include:  High-fiber foods, such as dried fruit, raw fruits and vegetables, nuts, seeds, and whole grain foods.  Spicy foods and high-fat foods.  Foods and beverages sweetened with high-fructose corn syrup, honey, or sugar alcohols such as xylitol, sorbitol, and mannitol. WHAT FOODS ARE RECOMMENDED? Grains White rice. White, [place], or pita breads (fresh or toasted), including plain rolls, buns, or bagels. White pasta. Saltine, soda, or graham crackers. Pretzels. Low-fiber cereal. Cooked cereals made with water (such as cornmeal, farina, or cream cereals). Plain muffins. Matzo. Melba toast. Zwieback.  Vegetables Potatoes (without the skin). Strained tomato and vegetable juices. Most well-cooked and canned vegetables without seeds. Tender lettuce. Fruits Cooked or canned applesauce, apricots, cherries, fruit cocktail, grapefruit, peaches, pears, or plums. Fresh bananas, apples without skin, cherries, grapes, cantaloupe, grapefruit, peaches, oranges, or plums.  Meat and Other Protein Products Baked or boiled chicken. Eggs. Tofu. Fish. Seafood. Smooth peanut butter. Ground or well-cooked tender beef, ham, veal, lamb, pork, or poultry.  Dairy Plain yogurt, kefir, and unsweetened liquid yogurt. Lactose-free milk, buttermilk, or soy milk. Plain hard cheese. Beverages Sport drinks. Clear broths. Diluted fruit juices (except prune). Regular,  caffeine-free sodas such as ginger ale. Water. Decaffeinated teas. Oral rehydration solutions. Sugar-free beverages not sweetened with sugar alcohols. Other Bouillon, broth, or soups made from recommended foods.  The items listed above may not be a complete list of recommended foods or beverages. Contact your dietitian for more options. WHAT FOODS ARE NOT RECOMMENDED? Grains Whole grain, whole wheat, bran, or rye breads, rolls, pastas, crackers, and cereals. Wild or brown rice. Cereals that contain more than 2 g of fiber per serving. Corn tortillas or taco shells. Cooked or dry oatmeal. Granola. Popcorn. Vegetables Raw vegetables. Cabbage, broccoli, Brussels sprouts, artichokes, baked beans, beet greens, corn, kale, legumes, peas, sweet potatoes, and yams. Potato skins. Cooked spinach and cabbage.  Fruits Dried fruit, including raisins and dates. Raw fruits. Stewed or dried prunes. Fresh apples with skin, apricots, mangoes, pears, raspberries, and strawberries.  Meat and Other Protein Products Chunky peanut butter. Nuts and seeds. Beans and lentils. Tomasa Blase.  Dairy High-fat cheeses. Milk, chocolate milk, and beverages made with milk, such as milk shakes. Cream. Ice cream. Sweets and Desserts Sweet rolls, doughnuts, and sweet breads. Pancakes and waffles. Fats and Oils Butter. Cream sauces. Margarine. Salad oils. Plain salad dressings. Olives. Avocados.  Beverages Caffeinated beverages (such as coffee, tea, soda, or energy drinks). Alcoholic beverages. Fruit juices with pulp. Prune juice. Soft drinks sweetened with high-fructose corn syrup or sugar alcohols. Other Coconut. Hot sauce. Chili powder. Mayonnaise. Gravy. Cream-based or milk-based soups.  The items listed above may not be a complete list of foods and beverages to avoid. Contact your dietitian for more information. WHAT SHOULD I DO IF I BECOME DEHYDRATED? Diarrhea can sometimes lead to dehydration. Signs of dehydration include dark  urine and dry mouth and skin. If you think you are dehydrated, you should rehydrate with an oral rehydration solution. These solutions can be purchased at pharmacies, retail stores, or online.  Drink -1 cup (120-240 mL) of oral rehydration solution each time you have an episode of diarrhea. If drinking this amount makes your diarrhea worse, try drinking smaller amounts more often. For example, drink 1-3 tsp (5-15 mL) every 5-10 minutes.  A general rule for staying hydrated is to drink 1-2 L of fluid per day. Talk to your health care provider about the specific amount you should be drinking each day. Drink enough fluids to keep your urine clear or pale yellow. Document Released: 04/27/2003 Document Revised: 02/09/2013 Document Reviewed: 12/28/2012 [facility] Patient Information 2015 [place], [place]. This information is not intended to replace advice given to you by your health care provider. Make sure you discuss any questions you have with your health care provider.  Abscess An abscess is an infected area that contains a collection of pus and debris.It can occur in almost any part of the body. An abscess is also known as a furuncle or boil. CAUSES  An abscess occurs when tissue gets infected. This can occur from blockage of oil or sweat glands, infection of hair follicles, or a minor injury to the skin. As the body tries to fight the infection, pus collects in the area and creates pressure under the skin. This pressure causes pain. People with weakened immune systems have difficulty fighting infections and get certain abscesses more often.  SYMPTOMS Usually an abscess develops on the skin and becomes a painful mass that is red, warm, and tender. If the abscess forms under the skin, you may feel a moveable soft area under the skin. Some abscesses break open (rupture) on their own, but most will continue to get worse without care. The infection can spread deeper into the body and eventually into the  bloodstream, causing you to feel ill.  DIAGNOSIS  Your caregiver will take your medical history and perform a physical exam. A sample of fluid may also be taken from the abscess to determine what is causing your infection. TREATMENT  Your caregiver may prescribe antibiotic medicines to fight the infection. However, taking antibiotics alone usually does not cure an abscess. Your caregiver may need to make a small cut (incision) in the abscess to drain the pus. In some cases, gauze is packed into the abscess to reduce pain and to continue draining the area. HOME CARE INSTRUCTIONS  Only take over-the-counter or prescription medicines for pain, discomfort, or fever as directed by your caregiver.  If you were prescribed antibiotics, take them as directed. Finish them even if you start to feel better.  If gauze is used, follow your caregiver's directions for changing the gauze.  To avoid spreading the infection:  Keep your draining abscess covered with a bandage.  Wash your hands well.  Do not share personal care items, towels, or whirlpools with others.  Avoid skin contact with others.  Keep your skin and clothes clean around the abscess.  Keep all follow-up appointments as directed by your caregiver. SEEK MEDICAL CARE IF:   You have increased pain, swelling, redness, fluid drainage, or bleeding.  You have muscle aches, chills, or a general ill feeling.  You have a fever. MAKE SURE YOU:   Understand these instructions.  Will watch your condition.  Will get help right away if you are not doing well or get worse. Document Released: 11/14/2004 Document Revised: 08/06/2011 Document Reviewed: 04/19/2011 [facility] Patient Information 2015 [place], [place]. This information is not intended to replace advice given to you by your health care provider. Make sure you discuss any questions you have with your health care provider.

## 2014-07-26 NOTE — Progress Notes (Signed)
ANTIBIOTIC CONSULT NOTE - INITIAL  Pharmacy Consult:  Vancomycin / Gentamicin Indication:  Enterococcus abscess  Allergies  Allergen Reactions  . Keflex [Cephalexin] Anaphylaxis  . Penicillins Anaphylaxis    Patient Measurements: Height: 5' 4.2" (163.1 cm) Weight: 189 lb 2.5 oz (85.8 kg) IBW/kg (Calculated) : 55.16  Adjust BW = 67 kg  Vital Signs: Temp: 98.8 F (37.1 C) (06/07 0620) Temp Source: Oral (06/07 0620) BP: 127/64 mmHg (06/07 0620) Pulse Rate: 94 (06/07 0620) Intake/Output from previous day: 06/06 0701 - 06/07 0700 In: 3392 [P.O.:680; I.V.:2012; IV Piggyback:700] Out: 460 [Stool:460]  Labs:  Recent Labs  07/25/14 0408 07/25/14 2358  WBC 12.0* 10.1  HGB 10.5* 9.6*  PLT 507* 446*   Estimated Creatinine Clearance: 93.3 mL/min (by C-G formula based on Cr of 0.81). No results for input(s): VANCOTROUGH, VANCOPEAK, VANCORANDOM, GENTTROUGH, GENTPEAK, GENTRANDOM, TOBRATROUGH, TOBRAPEAK, TOBRARND, AMIKACINPEAK, AMIKACINTROU, AMIKACIN in the last 72 hours.   Microbiology: Recent Results (from the past 720 hour(s))  Blood culture (routine x 2)     Status: None   Collection Time: 07/05/14 10:25 PM  Result Value Ref Range Status   Specimen Description BLOOD RIGHT ARM  Final   Special Requests BOTTLES DRAWN AEROBIC AND ANAEROBIC 4CC  Final   Culture   Final    NO GROWTH 5 DAYS Performed at [facility]    Report Status 07/12/2014 FINAL  Final  Blood culture (routine x 2)     Status: None   Collection Time: 07/05/14 10:51 PM  Result Value Ref Range Status   Specimen Description BLOOD RIGHT HAND  Final   Special Requests BOTTLES DRAWN AEROBIC AND ANAEROBIC 5CC  Final   Culture   Final    NO GROWTH 5 DAYS Performed at [facility]    Report Status 07/13/2014 FINAL  Final  Urine culture     Status: None   Collection Time: 07/21/14  7:31 AM  Result Value Ref Range Status   Specimen Description URINE, CLEAN CATCH  Final   Special Requests Normal   Final   Colony Count   Final    8,000 COLONIES/ML Performed at [facility]    Culture   Final    INSIGNIFICANT GROWTH Performed at [facility]    Report Status 07/22/2014 FINAL  Final  Culture, routine-abscess     Status: None   Collection Time: 07/22/14 10:21 AM  Result Value Ref Range Status   Specimen Description ABSCESS ABDOMEN  Final   Special Requests Normal  Final   Gram Stain   Final    ABUNDANT WBC PRESENT, PREDOMINANTLY PMN NO SQUAMOUS EPITHELIAL CELLS SEEN MODERATE GRAM POSITIVE COCCI IN CLUSTERS IN CHAINS FEW GRAM POSITIVE RODS Performed at [facility]    Culture   Final    ABUNDANT ENTEROCOCCUS SPECIES Performed at [facility]    Report Status 07/26/2014 FINAL  Final   Organism ID, Bacteria ENTEROCOCCUS SPECIES  Final      Susceptibility   Enterococcus species - MIC*    VANCOMYCIN 1 SENSITIVE Sensitive     AMPICILLIN <=2 SENSITIVE Sensitive     * ABUNDANT ENTEROCOCCUS SPECIES    Medical History: Past Medical History  Diagnosis Date  . History of syphilis   . Breast fibroadenoma   . History of ectopic pregnancy 2005    r salpyngectomy  . History of conization of cervix 2000  . H/O myomectomy 07-2006    robotic  . History of hysterectomy, supracervical 08-2008  robotic  . PMS (premenstrual syndrome)   . Back pain   . H/O sinusitis   . GERD (gastroesophageal reflux disease)   . Hx of herpes simplex type 2 infection   . History of syphilis   . Abnormal Pap smear 2000    [facility]opsy  . H/O metrorrhagia 12/2006  . Hypertension   . Headache(784.0)   . Diverticulitis of colon with perforation 05/20/2014      Assessment: 59 YOF presented on 07/21/14 with fever and was started on cipro and metronidazole for intra-abdominal infection.  Abdominal wall culture from abscess aspiration on 07/22/14 grew Enterococcus.  Pharmacy consulted to initiate vancomycin and gentamicin as synergy.  Baseline labs reviewed.  Cipro 6/2  >> 6/7 Flagyl 6/2 >> 6/7 Vanc 6/7 >> Gent 6/7 >>  6/2 UCx - negative 6/3 abd abscess - Enterococcus (sens amp, vanc)   Goal of Therapy:  Vanc trough ~15 mcg/mL  Gent peak 3-4 mcg/mL, trough < 1 mcg/mL   Plan:  - Vanc 1gm IV Q8H - Gent 80 mg IV Q8H - Monitor renal fxn, clinical progress, vanc and gent level as indicated   Rolly Magri D. Mina Marble, PharmD, BCPS Pager:  [phone] 07/26/2014, 8:10 AM

## 2014-07-26 NOTE — Consult Note (Addendum)
[facility] for Infectious Disease  Total days of antibiotics 6        Day 1vanco        Day 1 gent        (5 days of cipro metro)       Reason for Consult: enterococcal IAA    Referring Physician: gross  Active Problems:   TOBACCO USER   HTN (hypertension)   Intra-abdominal abscess   Diverticulitis of sigmoid colon s/p colectomy 06/30/2014    HPI: [name] is a 44 y.o. female wi th hx of recurrent sigmoid diverticulitis ( 4 episodes< 8yr) including in April 2016 with proximal sigmoid diverticulitis with an area of contained perforation, but no generalized peritonitis and no signs of sepsis. She had previous course of cipro plus metro but had difficulty with SE of metronidazole. In April, she was managed with ertapenem initially, converted to cipro/metronidazole for a total of 14 d with follow up to discuss surgical treatment. On 5/12 , she underwent elective lap sigmoid colectomy.However, on post op day 5, day she was discharged, she had fever of 102F and abdominal pain and leukocytosis. CT showed evidence of anastomosis leak. She underwent Laparoscopic placement of pelvic drain and  loop ileostomy on 5/18. She was discharged on cipro/metro for 2wks at this time. She started to have LLQ abdominal pain and nausea, that she attributed to metronidazole. She had a CT scan that reveals a recurrent abscess next to her anastomosis c/w a leak. 55mL purulent material aspirated from abscess, and unable to place drain due to small size.she was kept of cipro plus metro until today, switched to vanco and gentamicin due to culture results isolating amp S enterococcus but gram stain showed :  MODERATE GRAM POSITIVE COCCI  IN CLUSTERS IN CHAINS FEW GRAM POSITIVE RODS   She reports anaphylaxis reactions to PCN and cephalosporins. Has tolerated linezolid nad bactrim in the past.  Past Medical History  Diagnosis Date  . History of syphilis   . Breast fibroadenoma   . History of ectopic  pregnancy 2005    r salpyngectomy  . History of conization of cervix 2000  . H/O myomectomy 07-2006    robotic  . History of hysterectomy, supracervical 08-2008    robotic  . PMS (premenstrual syndrome)   . Back pain   . H/O sinusitis   . GERD (gastroesophageal reflux disease)   . Hx of herpes simplex type 2 infection   . History of syphilis   . Abnormal Pap smear 2000    [facility]  . H/O metrorrhagia 12/2006  . Hypertension   . Headache(784.0)   . Diverticulitis of colon with perforation 05/20/2014    Allergies:  Allergies  Allergen Reactions  . Keflex [Cephalexin] Anaphylaxis  . Penicillins Anaphylaxis     MEDICATIONS: . acetaminophen  1,000 mg Oral TID  . enoxaparin (LOVENOX) injection  40 mg Subcutaneous Q24H  . gentamicin  80 mg Intravenous Q8H  . lip balm  1 application Topical BID  . lisinopril  10 mg Oral Daily  . pantoprazole  40 mg Oral Daily  . saccharomyces boulardii  250 mg Oral BID  . sodium chloride  3 mL Intravenous Q12H  . vancomycin  1,000 mg Intravenous Q8H    History  Substance Use Topics  . Smoking status: Former Smoker -- 0.25 packs/day for 15 years    Types: Cigarettes    Quit date: 06/12/2011  . Smokeless tobacco: Never Used  . Alcohol Use:  1.5 oz/week    3 Standard drinks or equivalent per week    Family History  Problem Relation Age of Onset  . Hypertension Paternal Grandmother   . Diabetes Paternal Grandmother   . Hypertension Maternal Grandmother   . Sarcoidosis Mother   . Hypertension Mother   . Thyroid disease Maternal Aunt   . Mental illness Cousin    Review of Systems  Constitutional: Negative for fever, chills, diaphoresis, activity change, appetite change, fatigue and unexpected weight change.  HENT: Negative for congestion, sore throat, rhinorrhea, sneezing, trouble swallowing and sinus pressure.  Eyes: Negative for photophobia and visual disturbance.  Respiratory: Negative for cough, chest tightness, shortness of  breath, wheezing and stridor.  Cardiovascular: Negative for chest pain, palpitations and leg swelling.  Gastrointestinal: Negative for nausea, vomiting, abdominal pain, diarrhea, constipation, blood in stool, abdominal distention and anal bleeding.  Genitourinary: Negative for dysuria, hematuria, flank pain and difficulty urinating.  Musculoskeletal: Negative for myalgias, back pain, joint swelling, arthralgias and gait problem.  Skin: pain at PIV Neurological: Negative for dizziness, tremors, weakness and light-headedness.  Hematological: Negative for adenopathy. Does not bruise/bleed easily.  Psychiatric/Behavioral: Negative for behavioral problems, confusion, sleep disturbance, dysphoric mood, decreased concentration and agitation.     OBJECTIVE: Temp:  [98.7 F (37.1 C)-98.8 F (37.1 C)] 98.7 F (37.1 C) (06/07 1334) Pulse Rate:  [94-107] 99 (06/07 1334) Resp:  [16-18] 16 (06/07 1334) BP: (112-127)/(63-68) 122/68 mmHg (06/07 1334) SpO2:  [98 %-100 %] 100 % (06/07 1334) Physical Exam  Constitutional:  oriented to person, place, and time. appears well-developed and well-nourished. No distress.  HENT: [place]/AT, PERRLA, no scleral icterus Mouth/Throat: Oropharynx is clear and moist. No oropharyngeal exudate.  Cardiovascular: Normal rate, regular rhythm and normal heart sounds. Exam reveals no gallop and no friction rub.  No murmur heard.  Pulmonary/Chest: Effort normal and breath sounds normal. No respiratory distress.  has no wheezes.  Neck = supple, no nuchal rigidity Abdominal: Soft. Bowel sounds are normal.  RLQ ostomy, midline healing incision. soft Lymphadenopathy: no cervical adenopathy. No axillary adenopathy Neurological: alert and oriented to person, place, and time.  Skin: Skin is warm and dry. No rash noted. No erythema.  Psychiatric: a normal mood and affect.  behavior is normal.   LABS: Results for orders placed or performed during the hospital encounter of 07/21/14  (from the past 48 hour(s))  CBC with Differential/Platelet     Status: Abnormal   Collection Time: 07/25/14  4:08 AM  Result Value Ref Range   WBC 12.0 (H) 4.0 - 10.5 K/uL   RBC 3.59 (L) 3.87 - 5.11 MIL/uL   Hemoglobin 10.5 (L) 12.0 - 15.0 g/dL   HCT 32.3 (L) 36.0 - 46.0 %   MCV 90.0 78.0 - 100.0 fL   MCH 29.2 26.0 - 34.0 pg   MCHC 32.5 30.0 - 36.0 g/dL   RDW 13.7 11.5 - 15.5 %   Platelets 507 (H) 150 - 400 K/uL   Neutrophils Relative % 48 43 - 77 %   Neutro Abs 5.7 1.7 - 7.7 K/uL   Lymphocytes Relative 25 12 - 46 %   Lymphs Abs 3.0 0.7 - 4.0 K/uL   Monocytes Relative 15 (H) 3 - 12 %   Monocytes Absolute 1.9 (H) 0.1 - 1.0 K/uL   Eosinophils Relative 12 (H) 0 - 5 %   Eosinophils Absolute 1.4 (H) 0.0 - 0.7 K/uL   Basophils Relative 0 0 - 1 %   Basophils Absolute 0.0 0.0 -  0.1 K/uL  CBC     Status: Abnormal   Collection Time: 07/25/14 11:58 PM  Result Value Ref Range   WBC 10.1 4.0 - 10.5 K/uL   RBC 3.27 (L) 3.87 - 5.11 MIL/uL   Hemoglobin 9.6 (L) 12.0 - 15.0 g/dL   HCT 29.7 (L) 36.0 - 46.0 %   MCV 90.8 78.0 - 100.0 fL   MCH 29.4 26.0 - 34.0 pg   MCHC 32.3 30.0 - 36.0 g/dL   RDW 13.8 11.5 - 15.5 %   Platelets 446 (H) 150 - 400 K/uL    MICRO: 6/3 amp S enterococcus  ABUNDANT WBC PRESENT, PREDOMINANTLY PMN  NO SQUAMOUS EPITHELIAL CELLS SEEN  MODERATE GRAM POSITIVE COCCI  IN CLUSTERS IN CHAINS FEW GRAM POSITIVE RODS  IMAGING: Dg Chest Port 1 View  07/25/2014   CLINICAL DATA:  Pleurisy  EXAM: PORTABLE CHEST - 1 VIEW  COMPARISON:  07/21/2014  FINDINGS: Decreasing lung volumes with left basilar atelectasis. Right lung is clear. Heart is normal size. No effusions or acute bony abnormality.  IMPRESSION: Left base atelectasis.   Electronically Signed   By: Rolm Baptise M.D.   On: 07/25/2014 09:26   Dg Abd 2 Views  07/25/2014   CLINICAL DATA:  Abdominal pain .  EXAM: ABDOMEN - 2 VIEW  COMPARISON:  07/21/2014.  CT 07/21/2014.  FINDINGS: Soft tissue structures are unremarkable. No  bowel distention. Oral contrast in the colon. No free air identified. Right lower quadrant ostomy noted.  IMPRESSION: No acute abnormality identified. No bowel distention or free air noted.   Electronically Signed   By: Marcello Moores  Register   On: 07/25/2014 09:51  6/2 abd CT IMPRESSION: 1. Post partial resection of the sigmoid colon with development of an approximately 5 cm perianastomotic abscess with small amount of air tracking along the left perirenal space to the level of the left upper abdominal quadrant. This abscess may be amenable to CT-guided percutaneous drainage catheter placement as indicated. 2. Post right lower quadrant loop ileostomy creation without evidence of enteric obstruction.    Assessment/Plan:  44yo F with hx of recurrent diverticulitis underwent Lap sigmoid colectomy c/b anastomosis leak s/p laparoscopic placement of pelvic drain and laparoscopic loop ileostomy on 5/18 then found to have small intra-abdominal abscess in perianastomotic area on 6/2 s/p IR drainage. OR cultures showing enterococcus  - recommend to do 2 wks of linezolid 600mg  PO BID to cover the enterococcus - to cover anaerobes can try to keep metronidazole 500mg  PO TID. Would do a trial of premedicating with anti-nausea to see if she can tolerate metronidazole better.  - otherwise can consider changing to clindamycin or moxifloxacin (has some anaerobic activity - Resistance for b.fragilis is estimated at 30% for both agents). clinda also has GI side effects slightly worse than metronidazole  - will d/c vanco and gent - if she is going to be on linezolid longer than 2 wk she will need monitoring of cbc for thrombocytopenia  Health maintenance = will check for hiv  Kedric Bumgarner B. [facility] for Infectious Diseases [phone]

## 2014-07-27 LAB — CBC
HEMATOCRIT: 32.2 % — AB (ref 36.0–46.0)
Hemoglobin: 10.3 g/dL — ABNORMAL LOW (ref 12.0–15.0)
MCH: 28.9 pg (ref 26.0–34.0)
MCHC: 32 g/dL (ref 30.0–36.0)
MCV: 90.4 fL (ref 78.0–100.0)
Platelets: 488 10*3/uL — ABNORMAL HIGH (ref 150–400)
RBC: 3.56 MIL/uL — AB (ref 3.87–5.11)
RDW: 13.7 % (ref 11.5–15.5)
WBC: 7.4 10*3/uL (ref 4.0–10.5)

## 2014-07-27 LAB — BASIC METABOLIC PANEL
ANION GAP: 10 (ref 5–15)
CO2: 26 mmol/L (ref 22–32)
CREATININE: 0.77 mg/dL (ref 0.44–1.00)
Calcium: 9.2 mg/dL (ref 8.9–10.3)
Chloride: 100 mmol/L — ABNORMAL LOW (ref 101–111)
GFR calc Af Amer: >60 mL/min (ref >60)
GFR calc non Af Amer: >60 mL/min (ref >60)
Glucose, Bld: 117 mg/dL — ABNORMAL HIGH (ref 65–99)
Potassium: 3.9 mmol/L (ref 3.5–5.1)
SODIUM: 136 mmol/L (ref 135–145)

## 2014-07-27 LAB — HIV ANTIBODY (ROUTINE TESTING W REFLEX): HIV Screen 4th Generation wRfx: NONREACTIVE

## 2014-07-27 MED ORDER — METRONIDAZOLE 500 MG PO TABS: 500.0000 mg | ORAL_TABLET | Freq: Three times a day (TID) | ORAL | Status: DC

## 2014-07-27 MED ORDER — HYDROCODONE-ACETAMINOPHEN 5-300 MG PO TABS: 1.0000 | ORAL_TABLET | ORAL | Status: DC | PRN

## 2014-07-27 MED ORDER — LINEZOLID 600 MG PO TABS: 600.0000 mg | ORAL_TABLET | Freq: Two times a day (BID) | ORAL | Status: DC

## 2014-07-27 MED ORDER — ONDANSETRON HCL 4 MG PO TABS: 4.0000 mg | ORAL_TABLET | Freq: Three times a day (TID) | ORAL | Status: DC

## 2014-07-27 NOTE — Discharge Planning (Signed)
Patient discharged home in stable condition. Verbalizes understanding of all discharge instructions, including home medications and follow up appointments. 5 day supply of Zyvox given to pt.

## 2014-07-27 NOTE — Discharge Summary (Signed)
Patient ID: [name] MRN: [id number] DOB/AGE: 03/16/1970 44 y.o.  Admit date: 07/21/2014 Discharge date: 07/27/2014  Procedures: aspiration of intra-abdominal fluid collection by Dr. Barbie Banner 07-22-14  Consults: ID  Reason for Admission: [name] is a 44 year old female s/p lap assisted sigmoid colectomy for recurrent diverticulitis 06/30/14 with subsequent laparoscopic exploration with placement of a pelvic drain behind the anastomosis and diverting loop colostomy on 07/06/14. She was discharged on 07/11/14 and was doing fairly well until last night at which time she developed nausea and dry heaves along with some chills and sweats. Temperature no higher than 99 on Monday. Appetite has been ok and drinking ensures. She has also been on cipro/flagyl since her last admission. She had her surgical drain removed last Friday in the office by Dr. Georgette Dover. The patient presented today due to increase in LLQ abdominal pain and nausea. She has a CT scan that reveals a recurrent abscess next to her anastomosis c/e a leak. We have been asked to evaluate the patient for admission.  Admission Diagnoses:  1. S/p lap assisted sigmoid colectomy with subsequent leak and diverting loop ileostomy, now with recurrent abscess  Hospital Course: The patient was admitted and IR was consulted for possible drainage.  A drain was unable to be placed, so this intra-abdominal fluid collection was aspirated.  She was initially put on Cipro/Flagyl as this was what she was on at home and she has a PCN allergy.  Her WBC went up some after her aspiration, she continued to have some LUQ and LLQ.  Repeat plain films were obtained which were normal.  ID was consulted to assist with abx recommendations as her cultures revealed enterococcus.  She was ultimately put on zyvox and flagyl.  The patient began feeling quite a bit better.  She was stable on HD 6 for DC home. Discharge Diagnoses:  Active Problems:   TOBACCO USER  HTN (hypertension)   Intra-abdominal abscess   Diverticulitis of sigmoid colon s/p colectomy 06/30/2014 s/p ex lap with diverting loop ileostomy with intra-abdominal abscess  Discharge Medications:   Medication List    TAKE these medications        esomeprazole 40 MG capsule  Commonly known as:  NEXIUM  Take 40 mg by mouth daily.     HAIR/SKIN/NAILS/BIOTIN Tabs  Take 2 tablets by mouth daily with breakfast.     Hydrocodone-Acetaminophen 5-300 MG Tabs  Take 1-2 tablets by mouth every 4 (four) hours as needed. pain     ibuprofen 800 MG tablet  Commonly known as:  ADVIL,MOTRIN  Take 800 mg by mouth every 8 (eight) hours as needed for moderate pain.     linezolid 600 MG tablet  Commonly known as:  ZYVOX  Take 1 tablet (600 mg total) by mouth every 12 (twelve) hours.     lisinopril 10 MG tablet  Commonly known as:  PRINIVIL,ZESTRIL  Take 10 mg by mouth daily.     metroNIDAZOLE 500 MG tablet  Commonly known as:  FLAGYL  Take 1 tablet (500 mg total) by mouth 3 (three) times daily.     ondansetron 4 MG tablet  Commonly known as:  ZOFRAN  Take 1 tablet (4 mg total) by mouth every 8 (eight) hours.        Discharge Instructions:     Follow-up Information    Follow up with Maia Petties., MD. Schedule an appointment as soon as possible for a visit in 2 weeks.   Specialty:  General Surgery  Contact information:   [address] [place] [address] [phone]       Signed: Henreitta Cea 07/27/2014, 11:27 AM

## 2014-07-27 NOTE — Care Management (Addendum)
[name] with pharmacy  [facility] )  will fill 5 day prescription for Zyvox . Prescription sent to pharmacy . [facility] will also provide 9 day prescription for Zyvox . After prior authorization has been approved patient can have 9 day prescription filled . Also provided patient with co pay assistance card.   Explained above to patient and family , voiced understanding .   Magdalen Spatz RN BSN      Benefits check for Zyvox : prior authorization needed   Called prior authorization line [phone]   Spoke with [name]. [name] faxed form to [employer] . Form needs to be completed and faxed back to 1 [phone] . It takes 24 hours for [employer] to upload form in system than another 48 to 72 hours (  business days ) for decision . [name] aware and completed form . Form faxed .    Magdalen Spatz RN BSN [phone]

## 2014-08-03 ENCOUNTER — Other Ambulatory Visit: Payer: Self-pay | Admitting: Surgery

## 2014-08-03 ENCOUNTER — Emergency Department (HOSPITAL_COMMUNITY): Payer: BLUE CROSS/BLUE SHIELD

## 2014-08-03 ENCOUNTER — Emergency Department (HOSPITAL_COMMUNITY)
Admission: EM | Admit: 2014-08-03 | Discharge: 2014-08-03 | Disposition: A | Discharge status: Home / Self Care | Payer: BLUE CROSS/BLUE SHIELD | Attending: Emergency Medicine | Admitting: Emergency Medicine

## 2014-08-03 ENCOUNTER — Encounter (HOSPITAL_COMMUNITY): Payer: Self-pay | Admitting: Emergency Medicine

## 2014-08-03 DIAGNOSIS — Z87891 Personal history of nicotine dependence: Secondary | ICD-10-CM | POA: Insufficient documentation

## 2014-08-03 DIAGNOSIS — Z9889 Other specified postprocedural states: Secondary | ICD-10-CM | POA: Diagnosis not present

## 2014-08-03 DIAGNOSIS — Z86018 Personal history of other benign neoplasm: Secondary | ICD-10-CM | POA: Insufficient documentation

## 2014-08-03 DIAGNOSIS — K219 Gastro-esophageal reflux disease without esophagitis: Secondary | ICD-10-CM | POA: Insufficient documentation

## 2014-08-03 DIAGNOSIS — Z8709 Personal history of other diseases of the respiratory system: Secondary | ICD-10-CM | POA: Insufficient documentation

## 2014-08-03 DIAGNOSIS — R630 Anorexia: Secondary | ICD-10-CM | POA: Diagnosis not present

## 2014-08-03 DIAGNOSIS — R112 Nausea with vomiting, unspecified: Secondary | ICD-10-CM | POA: Insufficient documentation

## 2014-08-03 DIAGNOSIS — I10 Essential (primary) hypertension: Secondary | ICD-10-CM | POA: Diagnosis not present

## 2014-08-03 DIAGNOSIS — Z8742 Personal history of other diseases of the female genital tract: Secondary | ICD-10-CM | POA: Insufficient documentation

## 2014-08-03 DIAGNOSIS — Z79899 Other long term (current) drug therapy: Secondary | ICD-10-CM | POA: Diagnosis not present

## 2014-08-03 DIAGNOSIS — Z880 Allergy status to penicillin: Secondary | ICD-10-CM | POA: Diagnosis not present

## 2014-08-03 DIAGNOSIS — R1084 Generalized abdominal pain: Secondary | ICD-10-CM | POA: Insufficient documentation

## 2014-08-03 DIAGNOSIS — R140 Abdominal distension (gaseous): Secondary | ICD-10-CM | POA: Diagnosis not present

## 2014-08-03 DIAGNOSIS — Z8619 Personal history of other infectious and parasitic diseases: Secondary | ICD-10-CM | POA: Insufficient documentation

## 2014-08-03 LAB — LIPASE, BLOOD: LIPASE: 90 U/L — AB (ref 22–51)

## 2014-08-03 LAB — CBC WITH DIFFERENTIAL/PLATELET
Basophils Absolute: 0 10*3/uL (ref 0.0–0.1)
Basophils Relative: 0 % (ref 0–1)
EOS ABS: 0.3 10*3/uL (ref 0.0–0.7)
Eosinophils Relative: 6 % — ABNORMAL HIGH (ref 0–5)
HCT: 38.2 % (ref 36.0–46.0)
Hemoglobin: 12.6 g/dL (ref 12.0–15.0)
Lymphocytes Relative: 36 % (ref 12–46)
Lymphs Abs: 2 10*3/uL (ref 0.7–4.0)
MCH: 29.5 pg (ref 26.0–34.0)
MCHC: 33 g/dL (ref 30.0–36.0)
MCV: 89.5 fL (ref 78.0–100.0)
Monocytes Absolute: 0.8 10*3/uL (ref 0.1–1.0)
Monocytes Relative: 14 % — ABNORMAL HIGH (ref 3–12)
Neutro Abs: 2.4 10*3/uL (ref 1.7–7.7)
Neutrophils Relative %: 44 % (ref 43–77)
PLATELETS: 492 10*3/uL — AB (ref 150–400)
RBC: 4.27 MIL/uL (ref 3.87–5.11)
RDW: 13.9 % (ref 11.5–15.5)
WBC: 5.5 10*3/uL (ref 4.0–10.5)

## 2014-08-03 LAB — URINALYSIS, ROUTINE W REFLEX MICROSCOPIC
BILIRUBIN URINE: NEGATIVE
GLUCOSE, UA: NEGATIVE mg/dL
HGB URINE DIPSTICK: NEGATIVE
KETONES UR: 15 mg/dL — AB
Nitrite: POSITIVE — AB
PH: 5 (ref 5.0–8.0)
Protein, ur: 30 mg/dL — AB
SPECIFIC GRAVITY, URINE: 1.031 — AB (ref 1.005–1.030)
Urobilinogen, UA: 0.2 mg/dL (ref 0.0–1.0)

## 2014-08-03 LAB — COMPREHENSIVE METABOLIC PANEL
ALBUMIN: 4.3 g/dL (ref 3.5–5.0)
ALK PHOS: 41 U/L (ref 38–126)
ALT: 13 U/L — ABNORMAL LOW (ref 14–54)
AST: 18 U/L (ref 15–41)
Anion gap: 10 (ref 5–15)
BUN: 8 mg/dL (ref 6–20)
CO2: 30 mmol/L (ref 22–32)
CREATININE: 0.87 mg/dL (ref 0.44–1.00)
Calcium: 10.4 mg/dL — ABNORMAL HIGH (ref 8.9–10.3)
Chloride: 99 mmol/L — ABNORMAL LOW (ref 101–111)
GFR calc Af Amer: >60 mL/min (ref >60)
GFR calc non Af Amer: >60 mL/min (ref >60)
Glucose, Bld: 129 mg/dL — ABNORMAL HIGH (ref 65–99)
POTASSIUM: 4.1 mmol/L (ref 3.5–5.1)
Sodium: 139 mmol/L (ref 135–145)
Total Bilirubin: 0.6 mg/dL (ref 0.3–1.2)
Total Protein: 7.8 g/dL (ref 6.5–8.1)

## 2014-08-03 LAB — URINE MICROSCOPIC-ADD ON

## 2014-08-03 LAB — I-STAT CG4 LACTIC ACID, ED: LACTIC ACID, VENOUS: 1.75 mmol/L (ref 0.5–2.0)

## 2014-08-03 MED ORDER — MOXIFLOXACIN HCL 400 MG PO TABS: 400.0000 mg | ORAL_TABLET | Freq: Every day | ORAL | Status: DC

## 2014-08-03 MED ORDER — IOHEXOL 300 MG/ML  SOLN
100.0000 mL | Freq: Once | INTRAMUSCULAR | Status: AC | PRN
  Administered 2014-08-03: 100 mL via INTRAVENOUS

## 2014-08-03 MED ORDER — ONDANSETRON HCL 4 MG/2ML IJ SOLN
4.0000 mg | Freq: Once | INTRAMUSCULAR | Status: AC
  Administered 2014-08-03: 4 mg via INTRAVENOUS
  Filled 2014-08-03: qty 2

## 2014-08-03 MED ORDER — IOHEXOL 300 MG/ML  SOLN
25.0000 mL | Freq: Once | INTRAMUSCULAR | Status: AC | PRN
  Administered 2014-08-03: 25 mL via ORAL

## 2014-08-03 MED ORDER — SODIUM CHLORIDE 0.9 % IV SOLN
1000.0000 mL | Freq: Once | INTRAVENOUS | Status: AC
  Administered 2014-08-03: 1000 mL via INTRAVENOUS

## 2014-08-03 NOTE — Discharge Instructions (Signed)

## 2014-08-03 NOTE — Consult Note (Signed)
Reason for Consult:  Nausea and vomiting Referring Physician:  Dr. Van Clines [name] is an 44 y.o. female.  HPI: Pt well known to our service, she had diverticulitis in April 2016.  On 06/30/14 she underwent laparoscopic sigmoid colectomy.  She was discharged home on 07/05/14, but returned that evening with fever, pain, and found to have an anastomosis leak.  She had Laparoscopic placement of pelvic drain and laparoscopic loop ileostomy, by Dr. Georgette Dover. She was discharged on 07/11/14.  On 07/21/14 she was readmitted with nausea and vomiting.  She was found to have a small fluid collection that was aspirated by IR, fluid collection was to small to drain.  She was discharged on antibiotics 1 week ago.  She did well at home initially.  She says she started losing her appetite this weekend, but no nausea, vomiting or fever.  What she did eat did not make her sick.  This AM she got up and started having nausea, then vomiting.  She was not really able to eat this AM.  She presented to the ED this afternoon.  Work up so far shows she is afebrile, HR up some.  Labs show WBC is normal, H/H is stable.  Lipase is 90, other labs are pretty normal.  CT scan is pending at this point.  She is still on antibiotics and had her AM dose today, after taking some Ensure.   Past Medical History  Diagnosis Date  . History of syphilis   . Breast fibroadenoma   . History of ectopic pregnancy 2005    r salpyngectomy  . History of conization of cervix 2000  . H/O myomectomy 07-2006    robotic  . History of hysterectomy, supracervical 08-2008    robotic  . PMS (premenstrual syndrome)   . Back pain   . H/O sinusitis   . GERD (gastroesophageal reflux disease)   . Hx of herpes simplex type 2 infection   . History of syphilis   . Abnormal Pap smear 2000    [facility] Biopsy  . H/O metrorrhagia 12/2006  . Hypertension   . Headache(784.0)   . Diverticulitis of colon with perforation 05/20/2014    Past Surgical  History  Procedure Laterality Date  . Cervical cone biopsy  2000  . Ectopic pregnancy surgery  2005    R salpyngectomy  . Myomectomy  2008    robotic  . Robotic assisted lap vaginal hysterectomy  2010    supracervical  . Polypectomy  2009  . Salpingectomy  2005  . Dilation and curettage of uterus  2009  . Abdominal hysterectomy    . Breast surgery  2004    fibroademoma  . Breast lumpectomy  10/11    rt-neg  . Trigger finger release Right 09/28/2012    Procedure: RELEASE TRIGGER FINGER/A-1 PULLEY RIGHT THUMB;  Surgeon: Jolyn Nap, MD;  Location: [facility];  Service: Orthopedics;  Laterality: Right;  . Laparoscopic sigmoid colectomy N/A 06/30/2014    Procedure: LAPAROSCOPIC ASSISTED SIGMOID COLECTOMY;  Surgeon: Donnie Mesa, MD;  Location: [facility];  Service: General;  Laterality: N/A;  . Colon resection N/A 07/06/2014    Procedure: LAPAROSCOPIC LOOP ILEOSTOMY WITH PLACEMENT OF PELVIC DRAIN;  Surgeon: Donnie Mesa, MD;  [facility];  Service: General;  Laterality: N/A;    Family History  Problem Relation Age of Onset  . Hypertension Paternal Grandmother   . Diabetes Paternal Grandmother   . Hypertension Maternal Grandmother   . Sarcoidosis Mother   .  Hypertension Mother   . Thyroid disease Maternal Aunt   . Mental illness Cousin     Social History:  reports that she quit smoking about 3 years ago. Her smoking use included Cigarettes. She has a 3.75 pack-year smoking history. She has never used smokeless tobacco. She reports that she drinks about 1.5 oz of alcohol per week. She reports that she does not use illicit drugs.  Allergies:  Allergies  Allergen Reactions  . Keflex [Cephalexin] Anaphylaxis  . Penicillins Anaphylaxis   Prior to Admission medications   Medication Sig Start Date End Date Taking? Authorizing Provider  esomeprazole (NEXIUM) 40 MG capsule Take 40 mg by mouth daily. 07/14/14   Historical Provider, MD  Hydrocodone-Acetaminophen 5-300  MG TABS Take 1-2 tablets by mouth every 4 (four) hours as needed. pain 07/27/14   [name], PA-C  ibuprofen (ADVIL,MOTRIN) 800 MG tablet Take 800 mg by mouth every 8 (eight) hours as needed for moderate pain.    Historical Provider, MD  linezolid (ZYVOX) 600 MG tablet Take 1 tablet (600 mg total) by mouth every 12 (twelve) hours. 07/27/14   [name], PA-C  lisinopril (PRINIVIL,ZESTRIL) 10 MG tablet Take 10 mg by mouth daily.    Historical Provider, MD  metroNIDAZOLE (FLAGYL) 500 MG tablet Take 1 tablet (500 mg total) by mouth 3 (three) times daily. 07/27/14   [name], PA-C  Multiple Vitamins-Minerals (HAIR/SKIN/NAILS/BIOTIN) TABS Take 2 tablets by mouth daily with breakfast.    Historical Provider, MD  ondansetron (ZOFRAN) 4 MG tablet Take 1 tablet (4 mg total) by mouth every 8 (eight) hours. 07/27/14   [name], PA-C     . sodium chloride      Results for orders placed or performed during the hospital encounter of 08/03/14 (from the past 48 hour(s))  CBC with Differential     Status: Abnormal   Collection Time: 08/03/14  1:47 PM  Result Value Ref Range   WBC 5.5 4.0 - 10.5 K/uL   RBC 4.27 3.87 - 5.11 MIL/uL   Hemoglobin 12.6 12.0 - 15.0 g/dL   HCT 38.2 36.0 - 46.0 %   MCV 89.5 78.0 - 100.0 fL   MCH 29.5 26.0 - 34.0 pg   MCHC 33.0 30.0 - 36.0 g/dL   RDW 13.9 11.5 - 15.5 %   Platelets 492 (H) 150 - 400 K/uL   Neutrophils Relative % 44 43 - 77 %   Neutro Abs 2.4 1.7 - 7.7 K/uL   Lymphocytes Relative 36 12 - 46 %   Lymphs Abs 2.0 0.7 - 4.0 K/uL   Monocytes Relative 14 (H) 3 - 12 %   Monocytes Absolute 0.8 0.1 - 1.0 K/uL   Eosinophils Relative 6 (H) 0 - 5 %   Eosinophils Absolute 0.3 0.0 - 0.7 K/uL   Basophils Relative 0 0 - 1 %   Basophils Absolute 0.0 0.0 - 0.1 K/uL  Comprehensive metabolic panel     Status: Abnormal   Collection Time: 08/03/14  1:47 PM  Result Value Ref Range   Sodium 139 135 - 145 mmol/L   Potassium 4.1 3.5 - 5.1 mmol/L   Chloride 99 (L) 101 -  111 mmol/L   CO2 30 22 - 32 mmol/L   Glucose, Bld 129 (H) 65 - 99 mg/dL   BUN 8 6 - 20 mg/dL   Creatinine, Ser 0.87 0.44 - 1.00 mg/dL   Calcium 10.4 (H) 8.9 - 10.3 mg/dL   Total Protein 7.8 6.5 - 8.1 g/dL  Albumin 4.3 3.5 - 5.0 g/dL   AST 18 15 - 41 U/L   ALT 13 (L) 14 - 54 U/L   Alkaline Phosphatase 41 38 - 126 U/L   Total Bilirubin 0.6 0.3 - 1.2 mg/dL   GFR calc non Af Amer >60 >60 mL/min   GFR calc Af Amer >60 >60 mL/min    Comment: (NOTE) The eGFR has been calculated using the CKD EPI equation. This calculation has not been validated in all clinical situations. eGFR's persistently <60 mL/min signify possible Chronic Kidney Disease.    Anion gap 10 5 - 15  Lipase, blood     Status: Abnormal   Collection Time: 08/03/14  1:47 PM  Result Value Ref Range   Lipase 90 (H) 22 - 51 U/L    No results found.  Review of Systems  Constitutional: Negative for fever, chills, weight loss, malaise/fatigue and diaphoresis.       Appetite down all weekend, no nausea or vomiting prior to today.  HENT: Positive for congestion (sinus drainage).   Eyes: Negative.   Respiratory: Positive for cough (white thick sputum) and sputum production. Negative for hemoptysis, shortness of breath and wheezing.   Gastrointestinal: Positive for heartburn, nausea and vomiting. Negative for abdominal pain, diarrhea, constipation, blood in stool and melena.       Ileostomy is a little different in color.  Genitourinary: Negative.   Musculoskeletal: Negative.   Skin: Negative.   Neurological: Positive for weakness.  Endo/Heme/Allergies: Negative.   Psychiatric/Behavioral: Negative.    Blood pressure 105/63, pulse 104, temperature 98.5 F (36.9 C), temperature source Oral, resp. rate 20, SpO2 100 %. Physical Exam  Constitutional: She is oriented to person, place, and time. She appears well-developed. No distress.  HENT:  Head: Normocephalic and atraumatic.  Nose: Nose normal.  Eyes: Conjunctivae and EOM  are normal. Right eye exhibits no discharge. Left eye exhibits no discharge. No scleral icterus.  Neck: Normal range of motion. Neck supple. No JVD present. No tracheal deviation present. No thyromegaly present.  Cardiovascular: Normal rate, regular rhythm, normal heart sounds and intact distal pulses.  Exam reveals no gallop.   No murmur heard. Respiratory: Effort normal and breath sounds normal. No respiratory distress. She has no wheezes. She has no rales. She exhibits no tenderness.  GI: Soft. Bowel sounds are normal. She exhibits no distension and no mass. There is no tenderness. There is no rebound and no guarding.  Ileostomy is functional, I have not seen the ileostomy yet  Musculoskeletal: She exhibits no edema or tenderness.  Lymphadenopathy:    She has no cervical adenopathy.  Neurological: She is alert and oriented to person, place, and time. She displays normal reflexes. No cranial nerve deficit. Coordination normal.  Skin: Skin is warm and dry. No rash noted. She is not diaphoretic. No erythema. No pallor.  Psychiatric: She has a normal mood and affect. Her behavior is normal. Judgment and thought content normal.   Addendum:  CT scan show no sign of abscess.    CLINICAL DATA: Acute onset nausea and vomiting today. Left retroperitoneal abscess. One month postop from sigmoid colectomy and colostomy for recurrent diverticulitis.  EXAM: CT ABDOMEN AND PELVIS WITH CONTRAST  TECHNIQUE: Multidetector CT imaging of the abdomen and pelvis was performed using the standard protocol following bolus administration of intravenous contrast.  CONTRAST: 175m OMNIPAQUE IOHEXOL 300 MG/ML SOLN  COMPARISON: 07/21/2014  FINDINGS: Lower Chest: No acute findings.  Hepatobiliary: No masses or other significant abnormality identified.  Gallbladder is unremarkable.  Pancreas: No mass, inflammatory changes, or other significant abnormality identified.  Spleen: Within normal  limits in size and appearance.  Adrenals: No masses identified.  Kidneys/Urinary Tract: No evidence of masses or hydronephrosis.  Stomach/Bowel/Peritoneum: Right abdominal loop colostomy and sigmoid colon anastomotic staples noted. Previously seen left pelvic fluid collection adjacent to iliac vessels has resolved since previous study. No other abnormal fluid collections are seen. Normal appendix visualized. No evidence of bowel wall thickening or obstruction  Vascular/Lymphatic: No pathologically enlarged lymph nodes identified. No other significant abnormality visualized.  Reproductive: Prior hysterectomy noted. Adnexal regions are unremarkable in appearance.  Other: None.  Musculoskeletal: No suspicious bone lesions identified.  IMPRESSION: Resolution of left pelvic retroperitoneal fluid collection since previous study. No acute findings or other complication identified.   Electronically Signed  By: Earle Gell M.D.  On: 08/03/2014 18:25     Assessment/Plan: Nausea and vomiting - no sign of intra-abdominal process.  No abscess.   These GI symptoms could be a result of her Flagyl.  She has a lot of complaints about nausea when she takes this med.  According to the ID consult note, other options are Clindamycin or Avelox.  She only needs one more week of antibiotics.  Since Clindamycin also has significant GI side effects, will change from Flagyl to Avelox x 1 week.  PRN Phenergan as well.   JENNINGS,WILLARD 08/03/2014, 4:12 PM   Imogene Burn. Georgette Dover, MD, [facility]  08/03/2014 7:43 PM

## 2014-08-03 NOTE — ED Notes (Signed)
Pt stable, ambulatory, states understanding of discharge instructions 

## 2014-08-03 NOTE — ED Notes (Signed)
Surgical PA, Will, at bedside.

## 2014-08-03 NOTE — ED Provider Notes (Signed)
CSN: [id number]     Arrival date & time 08/03/14  1305 History   First MD Initiated Contact with Patient 08/03/14 1536     Chief Complaint  Patient presents with  . Emesis     (Consider location/radiation/quality/duration/timing/severity/associated sxs/prior Treatment) Patient is a 43 y.o. female presenting with vomiting.  Emesis Severity:  Moderate Duration:  1 day Timing:  Intermittent Number of daily episodes:  5 Quality:  Stomach contents Progression:  Resolved Chronicity:  New Recent urination:  Normal Relieved by:  None tried Worsened by:  Nothing tried Ineffective treatments:  None tried Associated symptoms: abdominal pain (mild discomfort)   Associated symptoms: no chills, no diarrhea, no fever, no headaches, no myalgias, no sore throat and no URI   Risk factors: prior abdominal surgery (recent diverticulitis s/p colectomy requring follow up loop ileostomy.)     Past Medical History  Diagnosis Date  . History of syphilis   . Breast fibroadenoma   . History of ectopic pregnancy 2005    r salpyngectomy  . History of conization of cervix 2000  . H/O myomectomy 07-2006    robotic  . History of hysterectomy, supracervical 08-2008    robotic  . PMS (premenstrual syndrome)   . Back pain   . H/O sinusitis   . GERD (gastroesophageal reflux disease)   . Hx of herpes simplex type 2 infection   . History of syphilis   . Abnormal Pap smear 2000    [facility] Bi[facility]  . H/O metrorrhagia 12/2006  . Hypertension   . Headache(784.0)   . Diverticulitis of colon with perforation 05/20/2014   Past Surgical History  Procedure Laterality Date  . Cervical cone biopsy  2000  . Ectopic pregnancy surgery  2005    R salpyngectomy  . Myomectomy  2008    robotic  . Robotic assisted lap vaginal hysterectomy  2010    supracervical  . Polypectomy  2009  . Salpingectomy  2005  . Dilation and curettage of uterus  2009  . Abdominal hysterectomy    . Breast surgery  2004    fibroademoma   . Breast lumpectomy  10/11    rt-neg  . Trigger finger release Right 09/28/2012    Procedure: RELEASE TRIGGER FINGER/A-1 PULLEY RIGHT THUMB;  Surgeon: Jolyn Nap, MD;  Location: [facility];  Service: Orthopedics;  Laterality: Right;  . Laparoscopic sigmoid colectomy N/A 06/30/2014    Procedure: LAPAROSCOPIC ASSISTED SIGMOID COLECTOMY;  Surgeon: Donnie Mesa, MD;  Location: [place];  Service: General;  Laterality: N/A;  . Colon resection N/A 07/06/2014    Procedure: LAPAROSCOPIC LOOP ILEOSTOMY WITH PLACEMENT OF PELVIC DRAIN;  Surgeon: Donnie Mesa, MD;  Location: [facility];  Service: General;  Laterality: N/A;   Family History  Problem Relation Age of Onset  . Hypertension Paternal Grandmother   . Diabetes Paternal Grandmother   . Hypertension Maternal Grandmother   . Sarcoidosis Mother   . Hypertension Mother   . Thyroid disease Maternal Aunt   . Mental illness Cousin    History  Substance Use Topics  . Smoking status: Former Smoker -- 0.25 packs/day for 15 years    Types: Cigarettes    Quit date: 06/12/2011  . Smokeless tobacco: Never Used  . Alcohol Use: 1.5 oz/week    3 Standard drinks or equivalent per week   OB History    Gravida Para Term Preterm AB TAB SAB Ectopic Multiple Living   4 2 2  2 1  0 1 0  2     Review of Systems  Constitutional: Negative for fever, chills, appetite change and fatigue.  HENT: Negative for congestion, ear pain, facial swelling, mouth sores and sore throat.   Eyes: Negative for visual disturbance.  Respiratory: Negative for cough, chest tightness and shortness of breath.   Cardiovascular: Negative for chest pain and palpitations.  Gastrointestinal: Positive for vomiting, abdominal pain (mild discomfort) and abdominal distention. Negative for nausea, diarrhea and blood in stool.  Endocrine: Negative for cold intolerance and heat intolerance.  Genitourinary: Negative for frequency, decreased urine volume and difficulty urinating.   Musculoskeletal: Negative for myalgias, back pain and neck stiffness.  Skin: Negative for rash.  Neurological: Negative for dizziness, weakness, light-headedness and headaches.  All other systems reviewed and are negative.     Allergies  Keflex and Penicillins  Home Medications   Prior to Admission medications   Medication Sig Start Date End Date Taking? Authorizing Provider  Hydrocodone-Acetaminophen 5-300 MG TABS Take 1-2 tablets by mouth every 4 (four) hours as needed. pain 07/27/14  Yes [name], PA-C  ibuprofen (ADVIL,MOTRIN) 800 MG tablet Take 800 mg by mouth every 8 (eight) hours as needed for moderate pain.   Yes Historical Provider, MD  linezolid (ZYVOX) 600 MG tablet Take 1 tablet (600 mg total) by mouth every 12 (twelve) hours. 07/27/14  Yes [name], PA-C  lisinopril (PRINIVIL,ZESTRIL) 10 MG tablet Take 10 mg by mouth daily.   Yes Historical Provider, MD  ondansetron (ZOFRAN) 4 MG tablet Take 1 tablet (4 mg total) by mouth every 8 (eight) hours. Patient taking differently: Take 4 mg by mouth every 8 (eight) hours as needed for nausea or vomiting.  07/27/14  Yes [name], PA-C  pantoprazole (PROTONIX) 40 MG tablet Take 40 mg by mouth daily.   Yes Historical Provider, MD  moxifloxacin (AVELOX) 400 MG tablet Take 1 tablet (400 mg total) by mouth daily at 8 pm. 08/03/14   Addison Lank, MD   BP 118/69 mmHg  Pulse 116  Temp(Src) 98.5 F (36.9 C) (Oral)  Resp 20  SpO2 100% Physical Exam  Constitutional: She is oriented to person, place, and time. She appears well-developed and well-nourished. No distress.  HENT:  Head: Normocephalic and atraumatic.  Right Ear: External ear normal.  Left Ear: External ear normal.  Nose: Nose normal.  Eyes: Conjunctivae and EOM are normal. Pupils are equal, round, and reactive to light. Right eye exhibits no discharge. Left eye exhibits no discharge. No scleral icterus.  Neck: Normal range of motion. Neck supple.  Cardiovascular:  Normal rate, regular rhythm and normal heart sounds.  Exam reveals no gallop and no friction rub.   No murmur heard. Pulmonary/Chest: Effort normal and breath sounds normal. No stridor. No respiratory distress. She has no wheezes.  Abdominal: Soft. She exhibits no distension. There is generalized tenderness (mild discomfort). There is no rigidity, no rebound, no guarding and no CVA tenderness. No hernia.  Ileostomy in the right abdomen with fecal matter in the ostomy bag.  Musculoskeletal: She exhibits no edema or tenderness.  Neurological: She is alert and oriented to person, place, and time.  Skin: Skin is warm and dry. No rash noted. She is not diaphoretic. No erythema.  Psychiatric: She has a normal mood and affect.    ED Course  Procedures (including critical care time) Labs Review Labs Reviewed  CBC WITH DIFFERENTIAL/PLATELET - Abnormal; Notable for the following:    Platelets 492 (*)    Monocytes Relative 14 (*)  Eosinophils Relative 6 (*)    All other components within normal limits  COMPREHENSIVE METABOLIC PANEL - Abnormal; Notable for the following:    Chloride 99 (*)    Glucose, Bld 129 (*)    Calcium 10.4 (*)    ALT 13 (*)    All other components within normal limits  LIPASE, BLOOD - Abnormal; Notable for the following:    Lipase 90 (*)    All other components within normal limits  URINALYSIS, ROUTINE W REFLEX MICROSCOPIC (NOT AT [facility]) - Abnormal; Notable for the following:    Color, Urine AMBER (*)    APPearance CLOUDY (*)    Specific Gravity, Urine 1.031 (*)    Ketones, ur 15 (*)    Protein, ur 30 (*)    Nitrite POSITIVE (*)    Leukocytes, UA SMALL (*)    All other components within normal limits  URINE MICROSCOPIC-ADD ON - Abnormal; Notable for the following:    Bacteria, UA FEW (*)    Casts HYALINE CASTS (*)    Crystals CA OXALATE CRYSTALS (*)    All other components within normal limits  URINE CULTURE  I-STAT CG4 LACTIC ACID, ED    Imaging Review Ct  Abdomen Pelvis W Contrast  08/03/2014   CLINICAL DATA:  Acute onset nausea and vomiting today. Left retroperitoneal abscess. One month postop from sigmoid colectomy and colostomy for recurrent diverticulitis.  EXAM: CT ABDOMEN AND PELVIS WITH CONTRAST  TECHNIQUE: Multidetector CT imaging of the abdomen and pelvis was performed using the standard protocol following bolus administration of intravenous contrast.  CONTRAST:  153mL OMNIPAQUE IOHEXOL 300 MG/ML  SOLN  COMPARISON:  07/21/2014  FINDINGS: Lower Chest: No acute findings.  Hepatobiliary: No masses or other significant abnormality identified.  Gallbladder is unremarkable.  Pancreas: No mass, inflammatory changes, or other significant abnormality identified.  Spleen:  Within normal limits in size and appearance.  Adrenals:  No masses identified.  Kidneys/Urinary Tract:  No evidence of masses or hydronephrosis.  Stomach/Bowel/Peritoneum: Right abdominal loop colostomy and sigmoid colon anastomotic staples noted. Previously seen left pelvic fluid collection adjacent to iliac vessels has resolved since previous study. No other abnormal fluid collections are seen. Normal appendix visualized. No evidence of bowel wall thickening or obstruction  Vascular/Lymphatic: No pathologically enlarged lymph nodes identified. No other significant abnormality visualized.  Reproductive: Prior hysterectomy noted. Adnexal regions are unremarkable in appearance.  Other:  None.  Musculoskeletal:  No suspicious bone lesions identified.  IMPRESSION: Resolution of left pelvic retroperitoneal fluid collection since previous study. No acute findings or other complication identified.   Electronically Signed   By: Earle Gell M.D.   On: 08/03/2014 18:25     EKG Interpretation None      MDM   44 year old female with a history of diverticulitis status post colectomy who required follow-up loop ileostomy that was discharged one week ago presents with 3 days of decreased appetite and  nausea with 1 day of emesis. Rest of the history of present illness and exam as above. Workup revealed evidence of dehydration. CBC without leukocytosis. CMP with no significant electrolyte derangements. CT obtained to assess for possible small bowel obstruction given the patient's perceived distention and decreased ileostomy output. Surgery was already aware of the patient and came down to evaluate.  Patient provided with IV fluids and symptomatic treatment.  Patient did have urinalysis with positive nitrites and small leukocytes. However there was rare bacteria and no WBCs. Patient with no urinary symptoms and afebrile. We'll  send this for culture and await results. We'll defer treatment for UTI at this time given her lack of symptoms.  CT abdomen and pelvis was unremarkable showing no evidence of small bowel obstruction inflammation or infection. The patient did report that her symptoms seem to worsen when she takes her Flagyl. She denies any alcohol consumption. After surgery evaluation they feel that her symptoms are likely related to her Flagyl side effect. They recommend changing from Flagyl to Avelox. Review of her this is appropriate as the patient does not meet inpatient criteria. She is in good condition is stable for discharge with strict return precautions. She will follow up with surgery as scheduled.  Reason seen in conjunction with Dr. Armstead Peaks.  Sibyl Parr, M.D. Resident  Final diagnoses:  Non-intractable vomiting with nausea, vomiting of unspecified type        Addison Lank, MD 08/04/14 [place], MD 08/06/14 [phone]

## 2014-08-03 NOTE — ED Notes (Addendum)
Pt reports multiple recent hospitalization for ongoing stomach issues. Was discharged last week. Began having nausea and vomiting today. Pt has ileostomy

## 2014-08-05 LAB — URINE CULTURE: Culture: NO GROWTH

## 2014-09-12 ENCOUNTER — Other Ambulatory Visit: Payer: Self-pay | Admitting: Surgery

## 2014-09-13 ENCOUNTER — Other Ambulatory Visit: Payer: Self-pay | Admitting: Surgery

## 2014-09-13 DIAGNOSIS — K5732 Diverticulitis of large intestine without perforation or abscess without bleeding: Secondary | ICD-10-CM

## 2014-09-21 ENCOUNTER — Ambulatory Visit
Admission: RE | Admit: 2014-09-21 | Discharge: 2014-09-21 | Disposition: A | Discharge status: Home / Self Care | Payer: BLUE CROSS/BLUE SHIELD | Source: Ambulatory Visit | Attending: Surgery | Admitting: Surgery

## 2014-09-21 DIAGNOSIS — K5732 Diverticulitis of large intestine without perforation or abscess without bleeding: Secondary | ICD-10-CM

## 2014-09-21 MED ORDER — IOPAMIDOL (ISOVUE-300) INJECTION 61%
100.0000 mL | Freq: Once | INTRAVENOUS | Status: AC | PRN
  Administered 2014-09-21: 100 mL via INTRAVENOUS

## 2014-09-23 ENCOUNTER — Ambulatory Visit: Payer: Self-pay | Admitting: Surgery

## 2014-09-23 NOTE — H&P (Signed)
[name] is an 44 y.o. female.   Chief Complaint: Loop ileostomy  HPI: s/p laparoscopic sigmoid colectomy on 06/30/14 for recurrent diverticulitis. She was discharged on 07/05/14 but began running a fever when she got home and developed pelvic pain. She came back to the ER that evening and a CT scan showed evidence of a posterior anastomotic leak. On 07/06/14, we washed out her pelvis laparoscopically, placed a drain behind the anastomosis, and brought out a loop ileostomy. She was discharged home on 07/11/14 with the drain in place. The drain was subsequently removed, but she was readmitted on 07/21/14 with a recurrent abscess. This was aspirated on 07/22/14. Cultures revealed enterococcus. ID recommended Zyvox and Flagyl. The Flagyl caused significant nausea, so this was changed to Avelox. She has completed her course of antibiotics. She has been slowly improving. Her ileostomy is functioning well but the output is relatively thick. Her appetite is improving and her nausea is improved.   Past Medical History  Diagnosis Date  . History of syphilis   . Breast fibroadenoma   . History of ectopic pregnancy 2005    r salpyngectomy  . History of conization of cervix 2000  . H/O myomectomy 07-2006    robotic  . History of hysterectomy, supracervical 08-2008    robotic  . PMS (premenstrual syndrome)   . Back pain   . H/O sinusitis   . GERD (gastroesophageal reflux disease)   . Hx of herpes simplex type 2 infection   . History of syphilis   . Abnormal Pap smear 2000    [facility] Biopsy  . H/O metrorrhagia 12/2006  . Hypertension   . Headache(784.0)   . Diverticulitis of colon with perforation 05/20/2014    Past Surgical History  Procedure Laterality Date  . Cervical cone biopsy  2000  . Ectopic pregnancy surgery  2005    R salpyngectomy  . Myomectomy  2008    robotic  . Robotic assisted lap vaginal hysterectomy  2010    supracervical  . Polypectomy  2009  . Salpingectomy  2005   . Dilation and curettage of uterus  2009  . Abdominal hysterectomy    . Breast surgery  2004    fibroademoma  . Breast lumpectomy  10/11    rt-neg  . Trigger finger release Right 09/28/2012    Procedure: RELEASE TRIGGER FINGER/A-1 PULLEY RIGHT THUMB;  Surgeon: Jolyn Nap, MD;  Location: [place];  Service: Orthopedics;  Laterality: Right;  . Laparoscopic sigmoid colectomy N/A 06/30/2014    Procedure: LAPAROSCOPIC ASSISTED SIGMOID COLECTOMY;  Surgeon: Donnie Mesa, MD;  Location: [facility];  Service: General;  Laterality: N/A;  . Colon resection N/A 07/06/2014    Procedure: LAPAROSCOPIC LOOP ILEOSTOMY WITH PLACEMENT OF PELVIC DRAIN;  Surgeon: Donnie Mesa, MD;  [facility];  Service: General;  Laterality: N/A;    Family History  Problem Relation Age of Onset  . Hypertension Paternal Grandmother   . Diabetes Paternal Grandmother   . Hypertension Maternal Grandmother   . Sarcoidosis Mother   . Hypertension Mother   . Thyroid disease Maternal Aunt   . Mental illness Cousin    Social History:  reports that she quit smoking about 3 years ago. Her smoking use included Cigarettes. She has a 3.75 pack-year smoking history. She has never used smokeless tobacco. She reports that she drinks about 1.5 oz of alcohol per week. She reports that she does not use illicit drugs.  Allergies:  Allergies  Allergen Reactions  .  Keflex [Cephalexin] Anaphylaxis  . Penicillins Anaphylaxis    Prior to Admission medications   Medication Sig Start Date End Date Taking? Authorizing Provider  Hydrocodone-Acetaminophen 5-300 MG TABS Take 1-2 tablets by mouth every 4 (four) hours as needed. pain 07/27/14   [name], PA-C  ibuprofen (ADVIL,MOTRIN) 800 MG tablet Take 800 mg by mouth every 8 (eight) hours as needed for moderate pain.    Historical Provider, MD  linezolid (ZYVOX) 600 MG tablet Take 1 tablet (600 mg total) by mouth every 12 (twelve) hours. 07/27/14   [name], PA-C   lisinopril (PRINIVIL,ZESTRIL) 10 MG tablet Take 10 mg by mouth daily.    Historical Provider, MD  moxifloxacin (AVELOX) 400 MG tablet Take 1 tablet (400 mg total) by mouth daily at 8 pm. 08/03/14   Addison Lank, MD  ondansetron (ZOFRAN) 4 MG tablet Take 1 tablet (4 mg total) by mouth every 8 (eight) hours. Patient taking differently: Take 4 mg by mouth every 8 (eight) hours as needed for nausea or vomiting.  07/27/14   [name], PA-C  pantoprazole (PROTONIX) 40 MG tablet Take 40 mg by mouth daily.    Historical Provider, MD    Ct Abdomen Pelvis W Contrast  09/21/2014   CLINICAL DATA:  History of diverticulitis with resection and anastomotic leak. Preop ileostomy reversal.  EXAM: CT ABDOMEN AND PELVIS WITH CONTRAST  TECHNIQUE: Multidetector CT imaging of the abdomen and pelvis was performed using the standard protocol following bolus administration of intravenous contrast.  CONTRAST:  140mL ISOVUE-300 IOPAMIDOL (ISOVUE-300) INJECTION 61%  COMPARISON:  CT abdomen 08/03/2014  FINDINGS: Lower chest:  Lung bases clear.  Hepatobiliary: Normal gallbladder and bile ducts. Normal appearing liver without liver lesion.  Pancreas: Negative  Spleen: Negative  Adrenals/Urinary Tract: Kidneys are normal. No renal mass or obstruction. No renal calculi. Adrenal glands normal. Urinary bladder empty and normal appearing.  Stomach/Bowel: Rectal catheter. Rectal contrast administered. Sigmoid anastomosis is identified and is widely patent. No edema in the sigmoid. There are few small scattered remaining tics in the sigmoid. Contrast refluxes into the cecum which appears normal. No additional diverticula in the colon. No colonic edema. Negative for bowel obstruction. Right lower quadrant ileostomy noted. Normal stomach.  Vascular/Lymphatic: Mild atherosclerotic calcification in the aorta without aneurysm.  Reproductive: Hysterectomy changes.  No adnexal mass.  Other: No free fluid.  Negative for adenopathy.  Musculoskeletal:  Negative  IMPRESSION: Sigmoid anastomosis appears widely patent. No colonic edema or obstruction. Several very small residual diverticula in the sigmoid colon remain. Ileostomy noted. No bowel obstruction.  No acute abnormality.   Electronically Signed   By: Franchot Gallo M.D.   On: 09/21/2014 10:07      Review of Systems  Constitutional: Negative for weight loss.  HENT: Negative for ear discharge, ear pain, hearing loss and tinnitus.   Eyes: Negative for blurred vision, double vision, photophobia and pain.  Respiratory: Negative for cough, sputum production and shortness of breath.   Cardiovascular: Negative for chest pain.  Gastrointestinal: Positive for abdominal pain. Negative for nausea and vomiting.  Genitourinary: Negative for dysuria, urgency, frequency and flank pain.  Musculoskeletal: Negative for myalgias, back pain, joint pain, falls and neck pain.  Neurological: Negative for dizziness, tingling, sensory change, focal weakness, loss of consciousness and headaches.  Endo/Heme/Allergies: Does not bruise/bleed easily.  Psychiatric/Behavioral: Negative for depression, memory loss and substance abuse. The patient is not nervous/anxious.     Physical Exam  WDWN in NAD Abd - soft, + BS;  minimal LLQ tenderness Ileostomy - pink; functioning well Incisions well-healed with no sign of infection No suprapubic/ pelvic tenderness   Assessment/Plan SIGMOID DIVERTICULITIS (562.11  K57.32) LARGE BOWEL ANASTOMOTIC LEAK (997.49  K91.89) ILEOSTOMY IN PLACE (V44.2  Z93.2)  Plan Ileostomy reversal.  The surgical procedure has been discussed with the patient.  Potential risks, benefits, alternative treatments, and expected outcomes have been explained.  All of the patient's questions at this time have been answered.  The likelihood of reaching the patient's treatment goal is good.  The patient understand the proposed surgical procedure and wishes to proceed.  Pegeen Stiger K. 09/23/2014,  3:33 PM

## 2014-10-15 ENCOUNTER — Encounter (HOSPITAL_COMMUNITY): Payer: Self-pay | Admitting: Emergency Medicine

## 2014-10-15 ENCOUNTER — Emergency Department (HOSPITAL_COMMUNITY)
Admission: EM | Admit: 2014-10-15 | Discharge: 2014-10-15 | Disposition: A | Discharge status: Home / Self Care | Payer: BLUE CROSS/BLUE SHIELD | Attending: Emergency Medicine | Admitting: Emergency Medicine

## 2014-10-15 ENCOUNTER — Emergency Department (HOSPITAL_COMMUNITY): Payer: BLUE CROSS/BLUE SHIELD

## 2014-10-15 DIAGNOSIS — Z8709 Personal history of other diseases of the respiratory system: Secondary | ICD-10-CM | POA: Insufficient documentation

## 2014-10-15 DIAGNOSIS — Z8742 Personal history of other diseases of the female genital tract: Secondary | ICD-10-CM | POA: Diagnosis not present

## 2014-10-15 DIAGNOSIS — Z86018 Personal history of other benign neoplasm: Secondary | ICD-10-CM | POA: Insufficient documentation

## 2014-10-15 DIAGNOSIS — R1013 Epigastric pain: Secondary | ICD-10-CM | POA: Insufficient documentation

## 2014-10-15 DIAGNOSIS — Z79899 Other long term (current) drug therapy: Secondary | ICD-10-CM | POA: Diagnosis not present

## 2014-10-15 DIAGNOSIS — I10 Essential (primary) hypertension: Secondary | ICD-10-CM | POA: Diagnosis not present

## 2014-10-15 DIAGNOSIS — Z880 Allergy status to penicillin: Secondary | ICD-10-CM | POA: Diagnosis not present

## 2014-10-15 DIAGNOSIS — R1032 Left lower quadrant pain: Secondary | ICD-10-CM | POA: Insufficient documentation

## 2014-10-15 DIAGNOSIS — Z8619 Personal history of other infectious and parasitic diseases: Secondary | ICD-10-CM | POA: Insufficient documentation

## 2014-10-15 DIAGNOSIS — Z87891 Personal history of nicotine dependence: Secondary | ICD-10-CM | POA: Insufficient documentation

## 2014-10-15 DIAGNOSIS — K219 Gastro-esophageal reflux disease without esophagitis: Secondary | ICD-10-CM | POA: Insufficient documentation

## 2014-10-15 LAB — URINALYSIS, ROUTINE W REFLEX MICROSCOPIC
Bilirubin Urine: NEGATIVE
GLUCOSE, UA: NEGATIVE mg/dL
Hgb urine dipstick: NEGATIVE
Ketones, ur: NEGATIVE mg/dL
Leukocytes, UA: NEGATIVE
Nitrite: NEGATIVE
PH: 5 (ref 5.0–8.0)
PROTEIN: NEGATIVE mg/dL
SPECIFIC GRAVITY, URINE: 1.022 (ref 1.005–1.030)
Urobilinogen, UA: 0.2 mg/dL (ref 0.0–1.0)

## 2014-10-15 LAB — CBC WITH DIFFERENTIAL/PLATELET
Basophils Absolute: 0 10*3/uL (ref 0.0–0.1)
Basophils Relative: 1 % (ref 0–1)
EOS ABS: 0.3 10*3/uL (ref 0.0–0.7)
Eosinophils Relative: 5 % (ref 0–5)
HCT: 42.4 % (ref 36.0–46.0)
Hemoglobin: 14.2 g/dL (ref 12.0–15.0)
LYMPHS ABS: 2.2 10*3/uL (ref 0.7–4.0)
LYMPHS PCT: 42 % (ref 12–46)
MCH: 30 pg (ref 26.0–34.0)
MCHC: 33.5 g/dL (ref 30.0–36.0)
MCV: 89.6 fL (ref 78.0–100.0)
Monocytes Absolute: 0.5 10*3/uL (ref 0.1–1.0)
Monocytes Relative: 10 % (ref 3–12)
NEUTROS PCT: 42 % — AB (ref 43–77)
Neutro Abs: 2.2 10*3/uL (ref 1.7–7.7)
Platelets: 335 10*3/uL (ref 150–400)
RBC: 4.73 MIL/uL (ref 3.87–5.11)
RDW: 13.7 % (ref 11.5–15.5)
WBC: 5.2 10*3/uL (ref 4.0–10.5)

## 2014-10-15 LAB — COMPREHENSIVE METABOLIC PANEL
ALK PHOS: 48 U/L (ref 38–126)
ALT: 48 U/L (ref 14–54)
AST: 37 U/L (ref 15–41)
Albumin: 4.1 g/dL (ref 3.5–5.0)
Anion gap: 9 (ref 5–15)
BUN: 5 mg/dL — AB (ref 6–20)
CALCIUM: 9.9 mg/dL (ref 8.9–10.3)
CO2: 25 mmol/L (ref 22–32)
Chloride: 104 mmol/L (ref 101–111)
Creatinine, Ser: 0.69 mg/dL (ref 0.44–1.00)
GFR calc non Af Amer: >60 mL/min (ref >60)
Glucose, Bld: 92 mg/dL (ref 65–99)
Potassium: 3.9 mmol/L (ref 3.5–5.1)
SODIUM: 138 mmol/L (ref 135–145)
Total Bilirubin: 0.6 mg/dL (ref 0.3–1.2)
Total Protein: 7.8 g/dL (ref 6.5–8.1)

## 2014-10-15 LAB — LIPASE, BLOOD: Lipase: 39 U/L (ref 22–51)

## 2014-10-15 MED ORDER — OXYCODONE-ACETAMINOPHEN 5-325 MG PO TABS: 1.0000 | ORAL_TABLET | Freq: Four times a day (QID) | ORAL | Status: DC | PRN

## 2014-10-15 MED ORDER — IOHEXOL 300 MG/ML  SOLN
100.0000 mL | Freq: Once | INTRAMUSCULAR | Status: AC | PRN
  Administered 2014-10-15: 100 mL via INTRAVENOUS

## 2014-10-15 MED ORDER — ONDANSETRON HCL 4 MG PO TABS: 4.0000 mg | ORAL_TABLET | Freq: Four times a day (QID) | ORAL | Status: DC

## 2014-10-15 MED ORDER — OXYCODONE-ACETAMINOPHEN 5-325 MG PO TABS
1.0000 | ORAL_TABLET | Freq: Once | ORAL | Status: AC
  Administered 2014-10-15: 1 via ORAL
  Filled 2014-10-15: qty 1

## 2014-10-15 MED ORDER — MORPHINE SULFATE (PF) 4 MG/ML IV SOLN
4.0000 mg | Freq: Once | INTRAVENOUS | Status: AC
  Administered 2014-10-15: 4 mg via INTRAVENOUS
  Filled 2014-10-15: qty 1

## 2014-10-15 NOTE — Discharge Instructions (Signed)

## 2014-10-15 NOTE — ED Notes (Signed)
Patient up ambulatory to the bathroom at this time with no difficulty or distress; will have patient to collect an urine sample if she can

## 2014-10-15 NOTE — ED Provider Notes (Signed)
CSN: [id number]     Arrival date & time 10/15/14  0744 History   First MD Initiated Contact with Patient 10/15/14 [phone]     Chief Complaint  Patient presents with  . Abdominal Pain     (Consider location/radiation/quality/duration/timing/severity/associated sxs/prior Treatment) HPI   PCP: Shamleffer, Herschell Dimes, MD Blood pressure 136/72, pulse 83, temperature 98.5 F (36.9 C), temperature source Oral, resp. rate 18, height 5\' 4"  (1.626 m), weight 184 lb (83.462 kg), SpO2 99 %.  [name] is a 44 y.o.female with a significant PMH of history syphilis, fibroadenoma, hx of ectopic pregnancy, back pain, hypertension, hysterectomy, chronic diverticulitis requiring sigmoidectomy in May 3094- which had complications and ultimately required a colostomy bag. She is due to have her colostomy reversed but has now developed LLQ pain that is similar to her previous boughts of diverticulitis.  Her pain is intermittent sharp shooting pains that have been progressively worsening in frequency and severity. She denies fevers, nausea, vomiting or bleeding into her bag, or back pain. No vaginal bleeding or dysuria.  The patient denies diaphoresis, fever, headache, weakness (general or focal), confusion, change of vision,  neck pain, dysphagia, aphagia, chest pain, shortness of breath,  back pain, nausea, vomiting, diarrhea, lower extremity swelling, rash.   Past Medical History  Diagnosis Date  . History of syphilis   . Breast fibroadenoma   . History of ectopic pregnancy 2005    r salpyngectomy  . History of conization of cervix 2000  . H/O myomectomy 07-2006    robotic  . History of hysterectomy, supracervical 08-2008    robotic  . PMS (premenstrual syndrome)   . Back pain   . H/O sinusitis   . GERD (gastroesophageal reflux disease)   . Hx of herpes simplex type 2 infection   . History of syphilis   . Abnormal Pap smear 2000    [facility] Biopsy  . H/O metrorrhagia 12/2006  . Hypertension    . Headache(784.0)   . Diverticulitis of colon with perforation 05/20/2014   Past Surgical History  Procedure Laterality Date  . Cervical cone biopsy  2000  . Ectopic pregnancy surgery  2005    R salpyngectomy  . Myomectomy  2008    robotic  . Robotic assisted lap vaginal hysterectomy  2010    supracervical  . Polypectomy  2009  . Salpingectomy  2005  . Dilation and curettage of uterus  2009  . Abdominal hysterectomy    . Breast surgery  2004    fibroademoma  . Breast lumpectomy  10/11    rt-neg  . Trigger finger release Right 09/28/2012    Procedure: RELEASE TRIGGER FINGER/A-1 PULLEY RIGHT THUMB;  Surgeon: Jolyn Nap, MD;  Location: [facility];  Service: Orthopedics;  Laterality: Right;  . Laparoscopic sigmoid colectomy N/A 06/30/2014    Procedure: LAPAROSCOPIC ASSISTED SIGMOID COLECTOMY;  Surgeon: Donnie Mesa, MD;  Location: [facility];  Service: General;  Laterality: N/A;  . Colon resection N/A 07/06/2014    Procedure: LAPAROSCOPIC LOOP ILEOSTOMY WITH PLACEMENT OF PELVIC DRAIN;  Surgeon: Donnie Mesa, MD;  Location[facility];  Service: General;  Laterality: N/A;   Family History  Problem Relation Age of Onset  . Hypertension Paternal Grandmother   . Diabetes Paternal Grandmother   . Hypertension Maternal Grandmother   . Sarcoidosis Mother   . Hypertension Mother   . Thyroid disease Maternal Aunt   . Mental illness Cousin    Social History  Substance Use Topics  .  Smoking status: Former Smoker -- 0.25 packs/day for 15 years    Types: Cigarettes    Quit date: 06/12/2011  . Smokeless tobacco: Never Used  . Alcohol Use: 1.5 oz/week    3 Standard drinks or equivalent per week   OB History    Gravida Para Term Preterm AB TAB SAB Ectopic Multiple Living   4 2 2  2 1  0 1 0 2     Review of Systems  10 Systems reviewed and are negative for acute change except as noted in the HPI.    Allergies  Keflex and Penicillins  Home Medications   Prior to  Admission medications   Medication Sig Start Date End Date Taking? Authorizing Provider  Black Cohosh (REMIFEMIN) 20 MG TABS Take 40 mg by mouth daily.   Yes Historical Provider, MD  ibuprofen (ADVIL,MOTRIN) 800 MG tablet Take 800 mg by mouth every 8 (eight) hours as needed for moderate pain.   Yes Historical Provider, MD  lisinopril (PRINIVIL,ZESTRIL) 10 MG tablet Take 10 mg by mouth daily.   Yes Historical Provider, MD  Multiple Vitamins-Minerals (HAIR/SKIN/NAILS) TABS Take 1 tablet by mouth daily.   Yes Historical Provider, MD  ondansetron (ZOFRAN) 4 MG tablet Take 1 tablet (4 mg total) by mouth every 8 (eight) hours. Patient taking differently: Take 4 mg by mouth every 8 (eight) hours as needed for nausea or vomiting.  07/27/14  Yes [name], PA-C  pantoprazole (PROTONIX) 40 MG tablet Take 40 mg by mouth daily.   Yes Historical Provider, MD  ondansetron (ZOFRAN) 4 MG tablet Take 1 tablet (4 mg total) by mouth every 6 (six) hours. 10/15/14   Delos Haring, PA-C  oxyCODONE-acetaminophen (PERCOCET/ROXICET) 5-325 MG per tablet Take 1 tablet by mouth every 6 (six) hours as needed for severe pain. 10/15/14   Andreah Goheen Carlota Raspberry, PA-C   BP 138/70 mmHg  Pulse 79  Temp(Src) 98.5 F (36.9 C) (Oral)  Resp 18  Ht 5\' 4"  (1.626 m)  Wt 184 lb (83.462 kg)  BMI 31.57 kg/m2  SpO2 100% Physical Exam  Constitutional: She appears well-developed and well-nourished. No distress.  Well appearing  HENT:  Head: Normocephalic and atraumatic.  Eyes: Pupils are equal, round, and reactive to light.  Neck: Normal range of motion. Neck supple.  Cardiovascular: Normal rate and regular rhythm.   Pulmonary/Chest: Effort normal.  Abdominal: Soft. Bowel sounds are normal. There is tenderness in the epigastric area and left lower quadrant. There is no rigidity, no rebound, no guarding and no CVA tenderness.  Neurological: She is alert.  Skin: Skin is warm and dry.  Nursing note and vitals reviewed.   ED Course   Procedures (including critical care time) Labs Review Labs Reviewed  CBC WITH DIFFERENTIAL/PLATELET - Abnormal; Notable for the following:    Neutrophils Relative % 42 (*)    All other components within normal limits  COMPREHENSIVE METABOLIC PANEL - Abnormal; Notable for the following:    BUN 5 (*)    All other components within normal limits  LIPASE, BLOOD  URINALYSIS, ROUTINE W REFLEX MICROSCOPIC (NOT AT [facility])    Imaging Review Ct Abdomen Pelvis W Contrast  10/15/2014   CLINICAL DATA:  LEFT lower quadrant pain beginning Thursday, history of diverticulitis with perforation 05/20/2014, hysterectomy, RIGHT salpingectomy, colon resection, ileostomy  EXAM: CT ABDOMEN AND PELVIS WITH CONTRAST  TECHNIQUE: Multidetector CT imaging of the abdomen and pelvis was performed using the standard protocol following bolus administration of intravenous contrast. Sagittal and coronal MPR  images reconstructed from axial data set.  CONTRAST:  160mL OMNIPAQUE IOHEXOL 300 MG/ML SOLN IV. No oral contrast.  COMPARISON:  09/21/2014  FINDINGS: Dependent atelectasis BILATERAL lower lobes.  Liver, gallbladder, spleen, pancreas, kidneys, and RIGHT adrenal gland normal.  Question slight nodular thickening of LEFT adrenal gland, unchanged.  Normal appendix.  Ascending colostomy.  Retained dense contrast in cecum.  Sigmoid anastomosis.  Minimal descending and proximal sigmoid diverticulosis.  No evidence of diverticulitis, perforation, or abscess.  Lax anterior abdominal wall fascia to umbilicus without discrete fascial defect/hernia.  Uterus surgically absent with question retained cervix.  Ovaries not visualized.  Unremarkable bladder and ureters.  No mass, adenopathy, free air or free fluid.  Bones unremarkable.  IMPRESSION: Ascending colostomy.  Minimal distal colonic diverticulosis without evidence of diverticulitis.  No acute intra-abdominal or intrapelvic abnormalities.   Electronically Signed   By: Lavonia Dana M.D.   On:  10/15/2014 12:03   I have personally reviewed and evaluated these images and lab results as part of my medical decision-making.   EKG Interpretation None      MDM   Final diagnoses:  Left lower quadrant pain    Patient has no vaginal symptoms and hx of hysterectomy/ovaries, I do not feel that a pelvic is necessary and the patient prefers not to have it.  Her urinalysis is negative, CBC, lipase and CMP are unremarkable.  Her CT abdomen shows no complication or abnormalities that would have caused her pain. Patient given reassurance and requests another dose of pain medication prior to arrival. Given strict return to ED precautions and needs to touch basis with her surgeon regarding her pain as she is due to have her colostomy reversed on Sept 2. Otherwise patient is well appearing and amenable to discharge.  Medications  oxyCODONE-acetaminophen (PERCOCET/ROXICET) 5-325 MG per tablet 1 tablet (not administered)  morphine 4 MG/ML injection 4 mg (4 mg Intravenous Given 10/15/14 1031)  iohexol (OMNIPAQUE) 300 MG/ML solution 100 mL (100 mLs Intravenous Contrast Given 10/15/14 1127)    44 y.o.[name]'s evaluation in the Emergency Department is complete. It has been determined that no acute conditions requiring further emergency intervention are present at this time. The patient/guardian have been advised of the diagnosis and plan. We have discussed signs and symptoms that warrant return to the ED, such as changes or worsening in symptoms.  Vital signs are stable at discharge. Filed Vitals:   10/15/14 1345  BP: 138/70  Pulse: 79  Temp:   Resp: 18    Patient/guardian has voiced understanding and agreed to follow-up with the PCP or specialist.    Delos Haring, PA-C 10/15/14 1356  Gareth Morgan, MD 10/17/14 [phone]

## 2014-10-15 NOTE — ED Notes (Signed)
IV Team consult ordered

## 2014-10-15 NOTE — ED Notes (Signed)
Patient placed on continuous pulse oximetry and blood pressure cuff 

## 2014-10-15 NOTE — ED Notes (Signed)
Pt. Stated, I had a sigmoidoscopy on May 12 and then I had to have an ileostomy, its due to be reversed.  My left lower stomach started hurting on Thursday.

## 2014-10-15 NOTE — ED Notes (Signed)
Tiffany, PA present in room

## 2014-10-15 NOTE — ED Notes (Signed)
Pelvic cart set up at bedside by me; Jonelle Sidle, [place] aware

## 2014-10-17 ENCOUNTER — Encounter (HOSPITAL_COMMUNITY)
Admission: RE | Admit: 2014-10-17 | Discharge: 2014-10-17 | Disposition: A | Discharge status: Home / Self Care | Payer: BLUE CROSS/BLUE SHIELD | Source: Ambulatory Visit | Attending: Surgery | Admitting: Surgery

## 2014-10-17 ENCOUNTER — Encounter (HOSPITAL_COMMUNITY): Payer: Self-pay

## 2014-10-17 ENCOUNTER — Ambulatory Visit: Payer: Self-pay | Admitting: Surgery

## 2014-10-17 DIAGNOSIS — Z01818 Encounter for other preprocedural examination: Secondary | ICD-10-CM | POA: Diagnosis not present

## 2014-10-17 NOTE — Progress Notes (Signed)
Telephone call to Dr Vonna Kotyk office re: [name] order preop. Left message for Entereg to be ordered if needed .

## 2014-10-17 NOTE — Pre-Procedure Instructions (Signed)
[name]  10/17/2014      [facility], [place] [address] Phone: [phone] Fax: [phone]  [id number] - [place], [place] - [address] AT [place] [address] [place] [address] Phone: [phone] Fax: [phone]    Your procedure is scheduled on Wednesday, September 7.  Report to [facility] Admitting at 8:30 A.M.   Call this number if you have problems the morning of surgery:  [phone]   Remember:  Do not eat food or drink liquids after midnight. Tuesday night.  Take these medicines the morning of surgery with A SIP OF WATER : none   Do not wear jewelry, make-up or nail polish.  Do not wear lotions, powders, or perfumes. Do not wear deodorant.  Do not shave 48 hours prior to surgery.     Do not bring valuables to the hospital.  [facility] is not responsible for any belongings or valuables.  Contacts, dentures or bridgework may not be worn into surgery.  Leave your suitcase in the car.  After surgery it may be brought to your room.  For patients admitted to the hospital, discharge time will be determined by your treatment team.      Special instructions:  [facility] - Preparing for Surgery  Before surgery, you can play an important role.  Because skin is not sterile, your skin needs to be as free of germs as possible.  You can reduce the number of germs on you skin by washing with CHG (chlorahexidine gluconate) soap before surgery.  CHG is an antiseptic cleaner which kills germs and bonds with the skin to continue killing germs even after washing.  Please DO NOT use if you have an allergy to CHG or antibacterial soaps.  If your skin becomes reddened/irritated stop using the CHG and inform your nurse when you arrive at Short Stay.  Do not shave (including legs and underarms) for at least 48 hours prior  to the first CHG shower.  You may shave your face.  Please follow these instructions carefully:   1.  Shower with CHG Soap the night before surgery and the     morning of Surgery.  2.  If you choose to wash your hair, wash your hair first as usual with your   normal shampoo.  3.  After you shampoo, rinse your hair and body thoroughly to remove the  Shampoo.  4.  Use CHG as you would any other liquid soap.  You can apply chg directly   to the skin and wash gently with scrungie or a clean washcloth.  5.  Apply the CHG Soap to your body ONLY FROM THE NECK DOWN.   Do not use on open wounds or open sores.  Avoid contact with your eyes,   ears, mouth and genitals (private parts).  Wash genitals (private parts)   with your normal soap.  6.  Wash thoroughly, paying special attention to the area where your surgery   will be performed.  7.  Thoroughly rinse your body with warm water from the neck down.  8.  DO NOT shower/wash with your normal soap after using and rinsing off   the CHG Soap.  9.  Pat yourself dry with a clean towel.            10.  Wear clean pajamas.  11.  Place clean sheets on your bed the night of your first shower and do not  sleep with pets.  Day of Surgery  Do not apply any lotions/deoderants the morning of surgery.  Please wear clean clothes to the hospital/surgery center.    Please read over the following fact sheets that you were given. Pain Booklet, Coughing and Deep Breathing and Surgical Site Infection Prevention

## 2014-10-20 HISTORY — PX: COLON SURGERY: SHX602

## 2014-10-26 ENCOUNTER — Inpatient Hospital Stay (HOSPITAL_COMMUNITY)
Admission: RE | Admit: 2014-10-26 | Discharge: 2014-10-29 | DRG: 331 | Disposition: A | Discharge status: Home / Self Care | Payer: BLUE CROSS/BLUE SHIELD | Source: Ambulatory Visit | Attending: Surgery | Admitting: Surgery

## 2014-10-26 ENCOUNTER — Inpatient Hospital Stay (HOSPITAL_COMMUNITY): Payer: BLUE CROSS/BLUE SHIELD | Admitting: Anesthesiology

## 2014-10-26 ENCOUNTER — Encounter (HOSPITAL_COMMUNITY): Payer: Self-pay | Admitting: *Deleted

## 2014-10-26 ENCOUNTER — Encounter (HOSPITAL_COMMUNITY)
Admission: RE | Disposition: A | Discharge status: Home / Self Care | Payer: Self-pay | Source: Ambulatory Visit | Attending: Surgery

## 2014-10-26 DIAGNOSIS — Z881 Allergy status to other antibiotic agents status: Secondary | ICD-10-CM | POA: Diagnosis not present

## 2014-10-26 DIAGNOSIS — Z880 Allergy status to penicillin: Secondary | ICD-10-CM

## 2014-10-26 DIAGNOSIS — K219 Gastro-esophageal reflux disease without esophagitis: Secondary | ICD-10-CM | POA: Diagnosis present

## 2014-10-26 DIAGNOSIS — Z9049 Acquired absence of other specified parts of digestive tract: Secondary | ICD-10-CM | POA: Diagnosis present

## 2014-10-26 DIAGNOSIS — Z90710 Acquired absence of both cervix and uterus: Secondary | ICD-10-CM | POA: Diagnosis not present

## 2014-10-26 DIAGNOSIS — I10 Essential (primary) hypertension: Secondary | ICD-10-CM | POA: Diagnosis present

## 2014-10-26 DIAGNOSIS — Z87891 Personal history of nicotine dependence: Secondary | ICD-10-CM | POA: Diagnosis not present

## 2014-10-26 DIAGNOSIS — Z432 Encounter for attention to ileostomy: Principal | ICD-10-CM

## 2014-10-26 DIAGNOSIS — Z932 Ileostomy status: Secondary | ICD-10-CM

## 2014-10-26 HISTORY — PX: ILEOSTOMY: SHX1783

## 2014-10-26 HISTORY — DX: Ileostomy status: Z93.2

## 2014-10-26 SURGERY — CREATION, ILEOSTOMY
Anesthesia: General | Site: Abdomen

## 2014-10-26 MED ORDER — ROCURONIUM BROMIDE 100 MG/10ML IV SOLN
INTRAVENOUS | Status: DC | PRN
  Administered 2014-10-26: 50 mg via INTRAVENOUS

## 2014-10-26 MED ORDER — METRONIDAZOLE IN NACL 5-0.79 MG/ML-% IV SOLN
500.0000 mg | INTRAVENOUS | Status: AC
  Administered 2014-10-26: 500 mg via INTRAVENOUS
  Filled 2014-10-26 (×2): qty 100

## 2014-10-26 MED ORDER — ZOLPIDEM TARTRATE 5 MG PO TABS: 5.0000 mg | ORAL_TABLET | Freq: Every evening | ORAL | Status: DC | PRN

## 2014-10-26 MED ORDER — ARTIFICIAL TEARS OP OINT
TOPICAL_OINTMENT | OPHTHALMIC | Status: AC
  Filled 2014-10-26: qty 3.5

## 2014-10-26 MED ORDER — DOCUSATE SODIUM 100 MG PO CAPS
100.0000 mg | ORAL_CAPSULE | Freq: Two times a day (BID) | ORAL | Status: DC
  Administered 2014-10-26 – 2014-10-29 (×6): 100 mg via ORAL
  Filled 2014-10-26 (×6): qty 1

## 2014-10-26 MED ORDER — SIMETHICONE 80 MG PO CHEW: 40.0000 mg | CHEWABLE_TABLET | Freq: Four times a day (QID) | ORAL | Status: DC | PRN

## 2014-10-26 MED ORDER — CIPROFLOXACIN IN D5W 400 MG/200ML IV SOLN
INTRAVENOUS | Status: AC
  Administered 2014-10-26: 400 mg via INTRAVENOUS
  Filled 2014-10-26: qty 200

## 2014-10-26 MED ORDER — PHENYLEPHRINE HCL 10 MG/ML IJ SOLN
INTRAMUSCULAR | Status: DC | PRN
  Administered 2014-10-26: 120 ug via INTRAVENOUS
  Administered 2014-10-26 (×2): 80 ug via INTRAVENOUS

## 2014-10-26 MED ORDER — INFLUENZA VAC SPLIT QUAD 0.5 ML IM SUSY
0.5000 mL | PREFILLED_SYRINGE | INTRAMUSCULAR | Status: AC
  Administered 2014-10-27: 0.5 mL via INTRAMUSCULAR
  Filled 2014-10-26: qty 0.5

## 2014-10-26 MED ORDER — LACTATED RINGERS IV SOLN
INTRAVENOUS | Status: DC
  Administered 2014-10-26: 10:00:00 via INTRAVENOUS

## 2014-10-26 MED ORDER — OXYCODONE-ACETAMINOPHEN 5-325 MG PO TABS: 1.0000 | ORAL_TABLET | ORAL | Status: DC | PRN

## 2014-10-26 MED ORDER — PANTOPRAZOLE SODIUM 40 MG PO TBEC: 40.0000 mg | DELAYED_RELEASE_TABLET | Freq: Every day | ORAL | Status: DC

## 2014-10-26 MED ORDER — ALVIMOPAN 12 MG PO CAPS
ORAL_CAPSULE | ORAL | Status: AC
  Administered 2014-10-26: 12 mg via ORAL
  Filled 2014-10-26: qty 1

## 2014-10-26 MED ORDER — KETOROLAC TROMETHAMINE 30 MG/ML IJ SOLN
30.0000 mg | Freq: Four times a day (QID) | INTRAMUSCULAR | Status: DC
  Administered 2014-10-26 – 2014-10-29 (×11): 30 mg via INTRAVENOUS
  Filled 2014-10-26 (×11): qty 1

## 2014-10-26 MED ORDER — ONDANSETRON HCL 4 MG/2ML IJ SOLN: 4.0000 mg | Freq: Once | INTRAMUSCULAR | Status: DC | PRN

## 2014-10-26 MED ORDER — LISINOPRIL 10 MG PO TABS
10.0000 mg | ORAL_TABLET | Freq: Every day | ORAL | Status: DC
  Administered 2014-10-26 – 2014-10-29 (×4): 10 mg via ORAL
  Filled 2014-10-26 (×4): qty 1

## 2014-10-26 MED ORDER — HYDROMORPHONE HCL 1 MG/ML IJ SOLN
INTRAMUSCULAR | Status: AC
  Administered 2014-10-26: 0.5 mg via INTRAVENOUS
  Filled 2014-10-26: qty 1

## 2014-10-26 MED ORDER — MIDAZOLAM HCL 5 MG/5ML IJ SOLN
INTRAMUSCULAR | Status: DC | PRN
  Administered 2014-10-26: 2 mg via INTRAVENOUS

## 2014-10-26 MED ORDER — SUGAMMADEX SODIUM 200 MG/2ML IV SOLN
INTRAVENOUS | Status: AC
Start: 2014-10-26 — End: 2014-10-26
  Filled 2014-10-26: qty 2

## 2014-10-26 MED ORDER — ONDANSETRON HCL 4 MG/2ML IJ SOLN
INTRAMUSCULAR | Status: DC | PRN
Start: 2014-10-26 — End: 2014-10-26
  Administered 2014-10-26: 4 mg via INTRAVENOUS

## 2014-10-26 MED ORDER — PROPOFOL 10 MG/ML IV BOLUS
INTRAVENOUS | Status: DC | PRN
  Administered 2014-10-26: 150 mg via INTRAVENOUS

## 2014-10-26 MED ORDER — LIDOCAINE HCL (CARDIAC) 20 MG/ML IV SOLN
INTRAVENOUS | Status: DC | PRN
Start: 2014-10-26 — End: 2014-10-26
  Administered 2014-10-26: 100 mg via INTRAVENOUS

## 2014-10-26 MED ORDER — MIDAZOLAM HCL 2 MG/2ML IJ SOLN
INTRAMUSCULAR | Status: AC
  Filled 2014-10-26: qty 4

## 2014-10-26 MED ORDER — ROCURONIUM BROMIDE 50 MG/5ML IV SOLN
INTRAVENOUS | Status: AC
  Filled 2014-10-26: qty 1

## 2014-10-26 MED ORDER — KETOROLAC TROMETHAMINE 30 MG/ML IJ SOLN
INTRAMUSCULAR | Status: AC
  Filled 2014-10-26: qty 1

## 2014-10-26 MED ORDER — PANTOPRAZOLE SODIUM 40 MG IV SOLR
40.0000 mg | Freq: Every day | INTRAVENOUS | Status: DC
  Administered 2014-10-26 – 2014-10-27 (×2): 40 mg via INTRAVENOUS
  Filled 2014-10-26 (×2): qty 40

## 2014-10-26 MED ORDER — PHENYLEPHRINE 40 MCG/ML (10ML) SYRINGE FOR IV PUSH (FOR BLOOD PRESSURE SUPPORT)
PREFILLED_SYRINGE | INTRAVENOUS | Status: AC
  Filled 2014-10-26: qty 10

## 2014-10-26 MED ORDER — ALVIMOPAN 12 MG PO CAPS
12.0000 mg | ORAL_CAPSULE | Freq: Once | ORAL | Status: AC
  Administered 2014-10-26: 12 mg via ORAL

## 2014-10-26 MED ORDER — ONDANSETRON HCL 4 MG/2ML IJ SOLN
INTRAMUSCULAR | Status: AC
Start: 2014-10-26 — End: 2014-10-26
  Filled 2014-10-26: qty 4

## 2014-10-26 MED ORDER — ARTIFICIAL TEARS OP OINT
TOPICAL_OINTMENT | OPHTHALMIC | Status: AC
Start: 2014-10-26 — End: 2014-10-26
  Filled 2014-10-26: qty 3.5

## 2014-10-26 MED ORDER — FENTANYL CITRATE (PF) 100 MCG/2ML IJ SOLN
INTRAMUSCULAR | Status: DC | PRN
  Administered 2014-10-26 (×2): 50 ug via INTRAVENOUS
  Administered 2014-10-26: 150 ug via INTRAVENOUS

## 2014-10-26 MED ORDER — ONDANSETRON 4 MG PO TBDP: 4.0000 mg | ORAL_TABLET | Freq: Four times a day (QID) | ORAL | Status: DC | PRN

## 2014-10-26 MED ORDER — PROMETHAZINE HCL 25 MG PO TABS: 12.5000 mg | ORAL_TABLET | Freq: Four times a day (QID) | ORAL | Status: DC | PRN

## 2014-10-26 MED ORDER — CIPROFLOXACIN IN D5W 400 MG/200ML IV SOLN
400.0000 mg | Freq: Two times a day (BID) | INTRAVENOUS | Status: AC
  Administered 2014-10-26: 400 mg via INTRAVENOUS
  Filled 2014-10-26: qty 200

## 2014-10-26 MED ORDER — ACETAMINOPHEN 325 MG PO TABS: 650.0000 mg | ORAL_TABLET | Freq: Four times a day (QID) | ORAL | Status: DC | PRN

## 2014-10-26 MED ORDER — MEPERIDINE HCL 25 MG/ML IJ SOLN: 6.2500 mg | INTRAMUSCULAR | Status: DC | PRN

## 2014-10-26 MED ORDER — 0.9 % SODIUM CHLORIDE (POUR BTL) OPTIME
TOPICAL | Status: DC | PRN
  Administered 2014-10-26 (×2): 1000 mL

## 2014-10-26 MED ORDER — METRONIDAZOLE IN NACL 5-0.79 MG/ML-% IV SOLN
500.0000 mg | Freq: Three times a day (TID) | INTRAVENOUS | Status: AC
  Administered 2014-10-26: 500 mg via INTRAVENOUS
  Filled 2014-10-26: qty 100

## 2014-10-26 MED ORDER — SUGAMMADEX SODIUM 500 MG/5ML IV SOLN
INTRAVENOUS | Status: AC
  Filled 2014-10-26: qty 5

## 2014-10-26 MED ORDER — LIDOCAINE HCL (CARDIAC) 20 MG/ML IV SOLN
INTRAVENOUS | Status: AC
  Filled 2014-10-26: qty 15

## 2014-10-26 MED ORDER — FENTANYL CITRATE (PF) 250 MCG/5ML IJ SOLN
INTRAMUSCULAR | Status: AC
  Filled 2014-10-26: qty 5

## 2014-10-26 MED ORDER — GLYCOPYRROLATE 0.2 MG/ML IJ SOLN
INTRAMUSCULAR | Status: AC
  Filled 2014-10-26: qty 1

## 2014-10-26 MED ORDER — ENOXAPARIN SODIUM 40 MG/0.4ML ~~LOC~~ SOLN
40.0000 mg | SUBCUTANEOUS | Status: DC
  Administered 2014-10-27 – 2014-10-28 (×2): 40 mg via SUBCUTANEOUS
  Filled 2014-10-26 (×2): qty 0.4

## 2014-10-26 MED ORDER — DIPHENHYDRAMINE HCL 25 MG PO CAPS: 25.0000 mg | ORAL_CAPSULE | Freq: Four times a day (QID) | ORAL | Status: DC | PRN

## 2014-10-26 MED ORDER — LACTATED RINGERS IV SOLN
INTRAVENOUS | Status: DC | PRN
  Administered 2014-10-26 (×2): via INTRAVENOUS

## 2014-10-26 MED ORDER — HYDROMORPHONE HCL 1 MG/ML IJ SOLN
0.5000 mg | INTRAMUSCULAR | Status: AC | PRN
  Administered 2014-10-26 (×2): 0.5 mg via INTRAVENOUS

## 2014-10-26 MED ORDER — KETOROLAC TROMETHAMINE 30 MG/ML IJ SOLN
30.0000 mg | Freq: Four times a day (QID) | INTRAMUSCULAR | Status: DC | PRN
  Filled 2014-10-26: qty 1

## 2014-10-26 MED ORDER — ACETAMINOPHEN 650 MG RE SUPP: 650.0000 mg | Freq: Four times a day (QID) | RECTAL | Status: DC | PRN

## 2014-10-26 MED ORDER — SUGAMMADEX SODIUM 200 MG/2ML IV SOLN
INTRAVENOUS | Status: DC | PRN
  Administered 2014-10-26: 200 mg via INTRAVENOUS

## 2014-10-26 MED ORDER — ARTIFICIAL TEARS OP OINT
TOPICAL_OINTMENT | OPHTHALMIC | Status: DC | PRN
  Administered 2014-10-26: 1 via OPHTHALMIC

## 2014-10-26 MED ORDER — ALVIMOPAN 12 MG PO CAPS
12.0000 mg | ORAL_CAPSULE | Freq: Two times a day (BID) | ORAL | Status: DC
  Administered 2014-10-27: 12 mg via ORAL
  Filled 2014-10-26: qty 1

## 2014-10-26 MED ORDER — HYDROMORPHONE HCL 1 MG/ML IJ SOLN
1.0000 mg | INTRAMUSCULAR | Status: DC | PRN
  Administered 2014-10-26 – 2014-10-27 (×7): 1 mg via INTRAVENOUS
  Filled 2014-10-26 (×7): qty 1

## 2014-10-26 MED ORDER — PROPOFOL 10 MG/ML IV BOLUS
INTRAVENOUS | Status: AC
  Filled 2014-10-26: qty 20

## 2014-10-26 MED ORDER — PROMETHAZINE HCL 25 MG/ML IJ SOLN
12.5000 mg | Freq: Four times a day (QID) | INTRAMUSCULAR | Status: DC | PRN
  Administered 2014-10-26: 12.5 mg via INTRAVENOUS
  Filled 2014-10-26: qty 1

## 2014-10-26 MED ORDER — POTASSIUM CHLORIDE IN NACL 20-0.9 MEQ/L-% IV SOLN
INTRAVENOUS | Status: DC
  Administered 2014-10-26 – 2014-10-27 (×2): via INTRAVENOUS
  Filled 2014-10-26 (×2): qty 1000

## 2014-10-26 MED ORDER — HYDROMORPHONE HCL 1 MG/ML IJ SOLN
0.2500 mg | INTRAMUSCULAR | Status: DC | PRN
  Administered 2014-10-26 (×4): 0.5 mg via INTRAVENOUS

## 2014-10-26 MED ORDER — DIPHENHYDRAMINE HCL 50 MG/ML IJ SOLN: 25.0000 mg | Freq: Four times a day (QID) | INTRAMUSCULAR | Status: DC | PRN

## 2014-10-26 MED ORDER — CIPROFLOXACIN IN D5W 400 MG/200ML IV SOLN: 400.0000 mg | INTRAVENOUS | Status: DC

## 2014-10-26 MED ORDER — ONDANSETRON HCL 4 MG/2ML IJ SOLN
4.0000 mg | Freq: Four times a day (QID) | INTRAMUSCULAR | Status: DC | PRN
  Administered 2014-10-26: 4 mg via INTRAVENOUS
  Filled 2014-10-26: qty 2

## 2014-10-26 MED ORDER — CHLORHEXIDINE GLUCONATE 4 % EX LIQD: 1.0000 "application " | Freq: Once | CUTANEOUS | Status: DC

## 2014-10-26 SURGICAL SUPPLY — 63 items
BLADE SURG ROTATE 9660 (MISCELLANEOUS) IMPLANT
BNDG GAUZE ELAST 4 BULKY (GAUZE/BANDAGES/DRESSINGS) ×3 IMPLANT
CANISTER SUCTION 2500CC (MISCELLANEOUS) ×3 IMPLANT
CHLORAPREP W/TINT 26ML (MISCELLANEOUS) ×3 IMPLANT
COVER MAYO STAND STRL (DRAPES) ×3 IMPLANT
DRAPE LAPAROSCOPIC ABDOMINAL (DRAPES) ×3 IMPLANT
DRAPE PROXIMA HALF (DRAPES) IMPLANT
DRAPE UTILITY XL STRL (DRAPES) ×3 IMPLANT
DRAPE WARM FLUID 44X44 (DRAPE) ×3 IMPLANT
DRSG OPSITE POSTOP 4X10 (GAUZE/BANDAGES/DRESSINGS) IMPLANT
DRSG OPSITE POSTOP 4X8 (GAUZE/BANDAGES/DRESSINGS) IMPLANT
DRSG PAD ABDOMINAL 8X10 ST (GAUZE/BANDAGES/DRESSINGS) ×3 IMPLANT
ELECT BLADE 6.5 EXT (BLADE) ×3 IMPLANT
ELECT CAUTERY BLADE 6.4 (BLADE) ×6 IMPLANT
ELECT REM PT RETURN 9FT ADLT (ELECTROSURGICAL) ×3
ELECTRODE REM PT RTRN 9FT ADLT (ELECTROSURGICAL) ×1 IMPLANT
GAUZE SPONGE 4X4 12PLY STRL (GAUZE/BANDAGES/DRESSINGS) ×3 IMPLANT
GEL ULTRASOUND 20GR AQUASONIC (MISCELLANEOUS) IMPLANT
GLOVE BIO SURGEON STRL SZ7 (GLOVE) ×3 IMPLANT
GLOVE BIOGEL PI IND STRL 7.0 (GLOVE) ×1 IMPLANT
GLOVE BIOGEL PI IND STRL 7.5 (GLOVE) ×2 IMPLANT
GLOVE BIOGEL PI INDICATOR 7.0 (GLOVE) ×2
GLOVE BIOGEL PI INDICATOR 7.5 (GLOVE) ×4
GLOVE SURG SIGNA 7.5 PF LTX (GLOVE) ×3 IMPLANT
GLOVE SURG SS PI 7.0 STRL IVOR (GLOVE) ×3 IMPLANT
GLOVE SURG SS PI 7.5 STRL IVOR (GLOVE) ×3 IMPLANT
GOWN STRL REUS W/ TWL LRG LVL3 (GOWN DISPOSABLE) ×2 IMPLANT
GOWN STRL REUS W/ TWL XL LVL3 (GOWN DISPOSABLE) ×2 IMPLANT
GOWN STRL REUS W/TWL LRG LVL3 (GOWN DISPOSABLE) ×6
GOWN STRL REUS W/TWL XL LVL3 (GOWN DISPOSABLE) ×4
KIT BASIN OR (CUSTOM PROCEDURE TRAY) ×3 IMPLANT
KIT ROOM TURNOVER OR (KITS) ×3 IMPLANT
LIGASURE IMPACT 36 18CM CVD LR (INSTRUMENTS) ×3 IMPLANT
NS IRRIG 1000ML POUR BTL (IV SOLUTION) ×6 IMPLANT
PACK GENERAL/GYN (CUSTOM PROCEDURE TRAY) ×3 IMPLANT
PAD ARMBOARD 7.5X6 YLW CONV (MISCELLANEOUS) ×3 IMPLANT
PENCIL BUTTON HOLSTER BLD 10FT (ELECTRODE) ×3 IMPLANT
RELOAD PROXIMATE 75MM BLUE (ENDOMECHANICALS) ×6 IMPLANT
SPONGE LAP 18X18 X RAY DECT (DISPOSABLE) IMPLANT
STAPLER GUN LINEAR PROX 60 (STAPLE) ×3 IMPLANT
STAPLER PROXIMATE 75MM BLUE (STAPLE) ×3 IMPLANT
STAPLER VISISTAT 35W (STAPLE) ×3 IMPLANT
SUCTION POOLE TIP (SUCTIONS) ×3 IMPLANT
SURGILUBE 2OZ TUBE FLIPTOP (MISCELLANEOUS) IMPLANT
SUT NOVA 1 T20/GS 25DT (SUTURE) ×6 IMPLANT
SUT PDS AB 1 TP1 96 (SUTURE) ×6 IMPLANT
SUT PROLENE 2 0 CT2 30 (SUTURE) IMPLANT
SUT PROLENE 2 0 KS (SUTURE) IMPLANT
SUT SILK 2 0 SH CR/8 (SUTURE) ×3 IMPLANT
SUT SILK 2 0 TIES 10X30 (SUTURE) ×3 IMPLANT
SUT SILK 3 0 SH CR/8 (SUTURE) ×3 IMPLANT
SUT SILK 3 0 TIES 10X30 (SUTURE) ×3 IMPLANT
SUT VIC AB 2-0 SH 27 (SUTURE) ×3
SUT VIC AB 2-0 SH 27XBRD (SUTURE) ×1 IMPLANT
TAPE CLOTH SURG 6X10 WHT LF (GAUZE/BANDAGES/DRESSINGS) ×3 IMPLANT
TOWEL OR 17X24 6PK STRL BLUE (TOWEL DISPOSABLE) ×3 IMPLANT
TOWEL OR 17X26 10 PK STRL BLUE (TOWEL DISPOSABLE) ×3 IMPLANT
TRAY FOLEY CATH 16FR SILVER (SET/KITS/TRAYS/PACK) ×3 IMPLANT
TRAY PROCTOSCOPIC FIBER OPTIC (SET/KITS/TRAYS/PACK) IMPLANT
TUBE CONNECTING 12'X1/4 (SUCTIONS) ×1
TUBE CONNECTING 12X1/4 (SUCTIONS) ×2 IMPLANT
WATER STERILE IRR 1000ML POUR (IV SOLUTION) IMPLANT
YANKAUER SUCT BULB TIP NO VENT (SUCTIONS) ×3 IMPLANT

## 2014-10-26 NOTE — Op Note (Signed)
Pre-op Diagnosis:  Loop ileostomy after sigmoid resection Post-op Diagnosis:  Same Procedure performed: Loop ileostomy reversal Surgeon:Beatris Belen K. Asst.: Dr. Nedra Hai Anesthesia: Gen. endotracheal Indications: This is a 44 year old female who is status post sigmoid colectomy on 06/30/14 for recurrent diverticulitis. One week after surgery she returned to the Emergency Room with evidence of a posterior anastomotic leak. She underwent laparoscopic washout with placement of a drain as well as a loop ileostomy. The abscess has resolved and the drain was removed. She has done quite well and a recent CT scan showed no evidence of abscess and no evidence of leak with rectal contrast. She presents now for ileostomy reversal.  Description of procedure: The patient is brought to the operating room and placed in supine position on the operating table. After an adequate level of general anesthesia was obtained, a Foley catheter was placed under sterile technique. The patient's ileostomy bag was removed and I closed the opening with a pursestring suture of 3-0 silk.  The adhesive was removed from the skin. We prepped the abdomen with ChloraPrep and prepped the ostomy site with Betadine. We draped sterile fashion. A timeout was taken to ensure the proper patient and proper procedure. We made an elliptical incision around the ostomy site. Dissection was carried down through the subcutaneous tissues with cautery. We dissected along the sides of the loop ileostomy down to the fascia. We bluntly entered the peritoneal cavity. The entire loop ileostomy was dissected free from the surrounding tissue and was delivered up into the wound. This seemed to be fairly mobile. We divided both limbs of the loop ileostomy with separate firings of the GIA-75 stapler. Created a side-to-side anastomosis with a another firing of the GIA-75 stapler. The interior of the anastomosis was inspected and was intact with no bleeding. Both  limbs were widely patent. We closed the common defect with a TA 60 stapler. We placed the anastomosis back in the abdomen. We irrigated the abdomen thoroughly.  The subcutaneous tissues were irrigated. The fascia was closed with multiple interrupted #1 Novafil sutures. The Scarpa's fascia was closed with 2-0 Vicryl suture. The subcutaneous tissues were then packed with 4 x 4 gauze. A dry dressing was applied. The patient was then extubated and brought to the recovery room in stable condition. All sponge, initially, and needle counts are correct.  Imogene Burn. Georgette Dover, MD, [facility]  General/ Trauma Surgery  10/26/2014 11:31 AM

## 2014-10-26 NOTE — Transfer of Care (Signed)
Immediate Anesthesia Transfer of Care Note  Patient: [name]  Procedure(s) Performed: Procedure(s): LOOP ILEOSTOMY REVERSAL (N/A)  Patient Location: PACU  Anesthesia Type:General  Level of Consciousness: awake, alert  and oriented  Airway & Oxygen Therapy: Patient Spontanous Breathing and Patient connected to nasal cannula oxygen  Post-op Assessment: Report given to RN, Post -op Vital signs reviewed and stable and Patient moving all extremities X 4  Post vital signs: Reviewed and stable  Last Vitals:  Filed Vitals:   10/26/14 0902  BP: 158/76  Pulse: 87  Temp: 36.7 C  Resp: 18    Complications: No apparent anesthesia complications

## 2014-10-26 NOTE — Anesthesia Procedure Notes (Signed)
Procedure Name: Intubation Date/Time: 10/26/2014 10:19 AM Performed by: Neldon Newport Pre-anesthesia Checklist: Patient being monitored, Suction available, Emergency Drugs available, Timeout performed and Patient identified Patient Re-evaluated:Patient Re-evaluated prior to inductionOxygen Delivery Method: Circle system utilized Preoxygenation: Pre-oxygenation with 100% oxygen Intubation Type: IV induction Ventilation: Mask ventilation without difficulty Laryngoscope Size: Mac and 3 Grade View: Grade I Tube type: Oral Tube size: 7.0 mm Number of attempts: 1 Placement Confirmation: positive ETCO2,  ETT inserted through vocal cords under direct vision and breath sounds checked- equal and bilateral Secured at: 21 cm Tube secured with: Tape Dental Injury: Teeth and Oropharynx as per pre-operative assessment

## 2014-10-26 NOTE — H&P (Signed)
Chief Complaint: Loop ileostomy HPI: s/p laparoscopic sigmoid colectomy on 06/30/14 for recurrent diverticulitis. She was discharged on 07/05/14 but began running a fever when she got home and developed pelvic pain. She came back to the ER that evening and a CT scan showed evidence of a posterior anastomotic leak. On 07/06/14, we washed out her pelvis laparoscopically, placed a drain behind the anastomosis, and brought out a loop ileostomy. She was discharged home on 07/11/14 with the drain in place. The drain was subsequently removed, but she was readmitted on 07/21/14 with a recurrent abscess. This was aspirated on 07/22/14. Cultures revealed enterococcus. ID recommended Zyvox and Flagyl. The Flagyl caused significant nausea, so this was changed to Avelox. She has completed her course of antibiotics. She has been slowly improving. Her ileostomy is functioning well but the output is relatively thick. Her appetite is improving and her nausea is improved.   Past Medical History  Diagnosis Date  . History of syphilis   . Breast fibroadenoma   . History of ectopic pregnancy 2005    r salpyngectomy  . History of conization of cervix 2000  . H/O myomectomy 07-2006    robotic  . History of hysterectomy, supracervical 08-2008    robotic  . PMS (premenstrual syndrome)   . Back pain   . H/O sinusitis   . GERD (gastroesophageal reflux disease)   . Hx of herpes simplex type 2 infection   . History of syphilis   . Abnormal Pap smear 2000    Cone Biopsy  . H/O metrorrhagia 12/2006  . Hypertension   . Headache(784.0)   . Diverticulitis of colon with perforation 05/20/2014    Past Surgical History  Procedure Laterality Date  . Cervical cone biopsy  2000  . Ectopic pregnancy surgery  2005    R salpyngectomy  . Myomectomy  2008    robotic  . Robotic assisted lap vaginal hysterectomy  2010     supracervical  . Polypectomy  2009  . Salpingectomy  2005  . Dilation and curettage of uterus  2009  . Abdominal hysterectomy    . Breast surgery  2004    fibroademoma  . Breast lumpectomy  10/11    rt-neg  . Trigger finger release Right 09/28/2012    Procedure: RELEASE TRIGGER FINGER/A-1 PULLEY RIGHT THUMB; Surgeon: Jolyn Nap, MD; Location: [facility]; Service: Orthopedics; Laterality: Right;  . Laparoscopic sigmoid colectomy N/A 06/30/2014    Procedure: LAPAROSCOPIC ASSISTED SIGMOID COLECTOMY; Surgeon: Donnie Mesa, MD; Location: [facility]; Service: General; Laterality: N/A;  . Colon resection N/A 07/06/2014    Procedure: LAPAROSCOPIC LOOP ILEOSTOMY WITH PLACEMENT OF PELVIC DRAIN; Surgeon: Donnie Mesa, MD; Location: [facility]; Service: General; Laterality: N/A;    Family History  Problem Relation Age of Onset  . Hypertension Paternal Grandmother   . Diabetes Paternal Grandmother   . Hypertension Maternal Grandmother   . Sarcoidosis Mother   . Hypertension Mother   . Thyroid disease Maternal Aunt   . Mental illness Cousin    Social History:  reports that she quit smoking about 3 years ago. Her smoking use included Cigarettes. She has a 3.75 pack-year smoking history. She has never used smokeless tobacco. She reports that she drinks about 1.5 oz of alcohol per week. She reports that she does not use illicit drugs.  Allergies:  Allergies  Allergen Reactions  . Keflex [Cephalexin] Anaphylaxis  . Penicillins Anaphylaxis    Prior to Admission medications   Medication Sig Start Date  End Date Taking? Authorizing Provider  Hydrocodone-Acetaminophen 5-300 MG TABS Take 1-2 tablets by mouth every 4 (four) hours as needed. pain 07/27/14   [name], PA-C  ibuprofen (ADVIL,MOTRIN) 800 MG tablet Take 800 mg by mouth every 8 (eight) hours as needed for  moderate pain.    Historical Provider, MD  linezolid (ZYVOX) 600 MG tablet Take 1 tablet (600 mg total) by mouth every 12 (twelve) hours. 07/27/14   [name], PA-C  lisinopril (PRINIVIL,ZESTRIL) 10 MG tablet Take 10 mg by mouth daily.    Historical Provider, MD  moxifloxacin (AVELOX) 400 MG tablet Take 1 tablet (400 mg total) by mouth daily at 8 pm. 08/03/14   Addison Lank, MD  ondansetron (ZOFRAN) 4 MG tablet Take 1 tablet (4 mg total) by mouth every 8 (eight) hours. Patient taking differently: Take 4 mg by mouth every 8 (eight) hours as needed for nausea or vomiting.  07/27/14   [name], PA-C  pantoprazole (PROTONIX) 40 MG tablet Take 40 mg by mouth daily.    Historical Provider, MD    Imaging Results    Ct Abdomen Pelvis W Contrast  09/21/2014 CLINICAL DATA: History of diverticulitis with resection and anastomotic leak. Preop ileostomy reversal. EXAM: CT ABDOMEN AND PELVIS WITH CONTRAST TECHNIQUE: Multidetector CT imaging of the abdomen and pelvis was performed using the standard protocol following bolus administration of intravenous contrast. CONTRAST: 117mL ISOVUE-300 IOPAMIDOL (ISOVUE-300) INJECTION 61% COMPARISON: CT abdomen 08/03/2014 FINDINGS: Lower chest: Lung bases clear. Hepatobiliary: Normal gallbladder and bile ducts. Normal appearing liver without liver lesion. Pancreas: Negative Spleen: Negative Adrenals/Urinary Tract: Kidneys are normal. No renal mass or obstruction. No renal calculi. Adrenal glands normal. Urinary bladder empty and normal appearing. Stomach/Bowel: Rectal catheter. Rectal contrast administered. Sigmoid anastomosis is identified and is widely patent. No edema in the sigmoid. There are few small scattered remaining tics in the sigmoid. Contrast refluxes into the cecum which appears normal. No additional diverticula in the colon. No colonic edema. Negative for bowel obstruction. Right lower quadrant ileostomy noted.  Normal stomach. Vascular/Lymphatic: Mild atherosclerotic calcification in the aorta without aneurysm. Reproductive: Hysterectomy changes. No adnexal mass. Other: No free fluid. Negative for adenopathy. Musculoskeletal: Negative IMPRESSION: Sigmoid anastomosis appears widely patent. No colonic edema or obstruction. Several very small residual diverticula in the sigmoid colon remain. Ileostomy noted. No bowel obstruction. No acute abnormality. Electronically Signed By: Franchot Gallo M.D. On: 09/21/2014 10:07       Review of Systems  Constitutional: Negative for weight loss.  HENT: Negative for ear discharge, ear pain, hearing loss and tinnitus.  Eyes: Negative for blurred vision, double vision, photophobia and pain.  Respiratory: Negative for cough, sputum production and shortness of breath.  Cardiovascular: Negative for chest pain.  Gastrointestinal: Positive for abdominal pain. Negative for nausea and vomiting.  Genitourinary: Negative for dysuria, urgency, frequency and flank pain.  Musculoskeletal: Negative for myalgias, back pain, joint pain, falls and neck pain.  Neurological: Negative for dizziness, tingling, sensory change, focal weakness, loss of consciousness and headaches.  Endo/Heme/Allergies: Does not bruise/bleed easily.  Psychiatric/Behavioral: Negative for depression, memory loss and substance abuse. The patient is not nervous/anxious.    Physical Exam  WDWN in NAD Abd - soft, + BS; minimal LLQ tenderness Ileostomy - pink; functioning well Incisions well-healed with no sign of infection No suprapubic/ pelvic tenderness   Assessment/Plan SIGMOID DIVERTICULITIS (562.11  K57.32) LARGE BOWEL ANASTOMOTIC LEAK (997.49  K91.89) ILEOSTOMY IN PLACE (V44.2  Z93.2)  Plan Ileostomy reversal. The surgical procedure has  been discussed with the patient. Potential risks, benefits, alternative treatments, and expected outcomes have been explained. All of the  patient's questions at this time have been answered. The likelihood of reaching the patient's treatment goal is good. The patient understand the proposed surgical procedure and wishes to proceed.      Imogene Burn. Georgette Dover, MD, [facility]  General/ Trauma Surgery  10/26/2014 6:53 AM

## 2014-10-26 NOTE — Anesthesia Preprocedure Evaluation (Addendum)
Anesthesia Evaluation  Patient identified by MRN, date of birth, ID band Patient awake    Reviewed: Allergy & Precautions, H&P , NPO status , Patient's Chart, lab work & pertinent test results  Airway Mallampati: II  TM Distance: >3 FB Neck ROM: full    Dental  (+) Teeth Intact, Dental Advidsory Given   Pulmonary former smoker,    Pulmonary exam normal        Cardiovascular hypertension, Pt. on medications Normal cardiovascular exam     Neuro/Psych Depression    GI/Hepatic GERD  Controlled and Medicated,  Endo/Other    Renal/GU      Musculoskeletal   Abdominal   Peds  Hematology   Anesthesia Other Findings   Reproductive/Obstetrics                            Anesthesia Physical Anesthesia Plan  ASA: II  Anesthesia Plan: General   Post-op Pain Management:    Induction: Intravenous  Airway Management Planned: LMA  Additional Equipment:   Intra-op Plan:   Post-operative Plan: Extubation in OR  Informed Consent: I have reviewed the patients History and Physical, chart, labs and discussed the procedure including the risks, benefits and alternatives for the proposed anesthesia with the patient or authorized representative who has indicated his/her understanding and acceptance.     Plan Discussed with: Surgeon and CRNA  Anesthesia Plan Comments:        Anesthesia Quick Evaluation

## 2014-10-26 NOTE — Anesthesia Postprocedure Evaluation (Signed)
Anesthesia Post Note  Patient: [name]  Procedure(s) Performed: Procedure(s) (LRB): LOOP ILEOSTOMY REVERSAL (N/A)  Anesthesia type: general  Patient location: PACU  Post pain: Pain level controlled  Post assessment: Patient's Cardiovascular Status Stable  Last Vitals:  Filed Vitals:   10/26/14 1435  BP:   Pulse:   Temp: 36.6 C  Resp:     Post vital signs: Reviewed and stable  Level of consciousness: sedated  Complications: No apparent anesthesia complications

## 2014-10-27 ENCOUNTER — Encounter (HOSPITAL_COMMUNITY): Payer: Self-pay | Admitting: Surgery

## 2014-10-27 LAB — CBC
HCT: 35.3 % — ABNORMAL LOW (ref 36.0–46.0)
Hemoglobin: 11.3 g/dL — ABNORMAL LOW (ref 12.0–15.0)
MCH: 29.4 pg (ref 26.0–34.0)
MCHC: 32 g/dL (ref 30.0–36.0)
MCV: 91.9 fL (ref 78.0–100.0)
PLATELETS: 302 10*3/uL (ref 150–400)
RBC: 3.84 MIL/uL — AB (ref 3.87–5.11)
RDW: 13.6 % (ref 11.5–15.5)
WBC: 7.1 10*3/uL (ref 4.0–10.5)

## 2014-10-27 LAB — BASIC METABOLIC PANEL WITH GFR
Anion gap: 7 (ref 5–15)
BUN: <5 mg/dL — ABNORMAL LOW (ref 6–20)
CO2: 29 mmol/L (ref 22–32)
Calcium: 8.9 mg/dL (ref 8.9–10.3)
Chloride: 103 mmol/L (ref 101–111)
Creatinine, Ser: 0.75 mg/dL (ref 0.44–1.00)
GFR calc Af Amer: >60 mL/min
GFR calc non Af Amer: >60 mL/min
Glucose, Bld: 100 mg/dL — ABNORMAL HIGH (ref 65–99)
Potassium: 3.8 mmol/L (ref 3.5–5.1)
Sodium: 139 mmol/L (ref 135–145)

## 2014-10-27 MED ORDER — HYDROCODONE-ACETAMINOPHEN 5-325 MG PO TABS
1.0000 | ORAL_TABLET | ORAL | Status: DC | PRN
  Administered 2014-10-27 – 2014-10-28 (×3): 2 via ORAL
  Filled 2014-10-27 (×3): qty 2

## 2014-10-27 NOTE — Progress Notes (Signed)
1 Day Post-Op  Subjective: Patient doesn't like Percocet - requests Vicodin Passing small amount of flatus No nausea - tolerating clears  Objective: Vital signs in last 24 hours: Temp:  [97.4 F (36.3 C)-98.2 F (36.8 C)] 98.2 F (36.8 C) (09/08 0635) Pulse Rate:  [69-89] 74 (09/08 0635) Resp:  [7-18] 16 (09/08 0635) BP: (100-158)/(61-99) 102/68 mmHg (09/08 0635) SpO2:  [99 %-100 %] 100 % (09/08 0635) Weight:  [83.462 kg (184 lb)-90.719 kg (200 lb)] 90.719 kg (200 lb) (09/07 1458) Last BM Date: 10/25/14  Intake/Output from previous day: 09/07 0701 - 09/08 0700 In: 2330 [P.O.:240; I.V.:1990; IV Piggyback:100] Out: 37 [Urine:100; Emesis/NG output:760; Blood:12] Intake/Output this shift:    General appearance: alert, cooperative and no distress Resp: clear to auscultation bilaterally Cardio: regular rate and rhythm, S1, S2 normal, no murmur, click, rub or gallop GI: soft; tender in RLQ; Wound - clean; minimal drainage; no bleeding  Lab Results:   Recent Labs  10/27/14 0425  WBC 7.1  HGB 11.3*  HCT 35.3*  PLT 302   BMET  Recent Labs  10/27/14 0425  NA 139  K 3.8  CL 103  CO2 29  GLUCOSE 100*  BUN <5*  CREATININE 0.75  CALCIUM 8.9   PT/INR No results for input(s): LABPROT, INR in the last 72 hours. ABG No results for input(s): PHART, HCO3 in the last 72 hours.  Invalid input(s): PCO2, PO2  Studies/Results: No results found.  Anti-infectives: Anti-infectives    Start     Dose/Rate Route Frequency Ordered Stop   10/26/14 2200  ciprofloxacin (CIPRO) IVPB 400 mg     400 mg 200 mL/hr over 60 Minutes Intravenous Every 12 hours 10/26/14 1457 10/26/14 2241   10/26/14 1830  metroNIDAZOLE (FLAGYL) IVPB 500 mg     500 mg 100 mL/hr over 60 Minutes Intravenous Every 8 hours 10/26/14 1457 10/26/14 1923   10/26/14 1015  metroNIDAZOLE (FLAGYL) IVPB 500 mg     500 mg 100 mL/hr over 60 Minutes Intravenous On call to O.R. 10/26/14 0905 10/26/14 1044   10/26/14  1015  ciprofloxacin (CIPRO) IVPB 400 mg  Status:  Discontinued     400 mg 200 mL/hr over 60 Minutes Intravenous On call to O.R. 10/26/14 0905 10/26/14 1447   10/26/14 0911  ciprofloxacin (CIPRO) 400 MG/200ML IVPB    Comments:  Sammuel Cooper   : cabinet override      10/26/14 0911 10/26/14 1004      Assessment/Plan: s/p Procedure(s): LOOP ILEOSTOMY REVERSAL (N/A) Continue clears until better bowel function Continue Entereg Switch pain meds to Percocet Continue Toradol Dressing changes q shift per nursing  Encourage ambulation/ IS   LOS: 1 day    Windi Toro K. 10/27/2014

## 2014-10-28 MED ORDER — WHITE PETROLATUM GEL
Status: AC
  Administered 2014-10-28: 20:00:00
  Filled 2014-10-28: qty 1

## 2014-10-28 MED ORDER — HYDROMORPHONE HCL 1 MG/ML IJ SOLN: 1.0000 mg | INTRAMUSCULAR | Status: DC | PRN

## 2014-10-28 MED ORDER — HYDROMORPHONE HCL 2 MG PO TABS
2.0000 mg | ORAL_TABLET | ORAL | Status: DC | PRN
  Administered 2014-10-28 – 2014-10-29 (×3): 2 mg via ORAL
  Filled 2014-10-28 (×3): qty 1

## 2014-10-28 MED ORDER — PANTOPRAZOLE SODIUM 40 MG PO TBEC
40.0000 mg | DELAYED_RELEASE_TABLET | Freq: Every day | ORAL | Status: DC
  Administered 2014-10-28: 40 mg via ORAL
  Filled 2014-10-28: qty 1

## 2014-10-28 MED ORDER — HYDROMORPHONE HCL 2 MG PO TABS: 2.0000 mg | ORAL_TABLET | ORAL | Status: DC | PRN

## 2014-10-28 NOTE — Discharge Instructions (Signed)
[facility], [place] [phone]  OPEN ABDOMINAL SURGERY: POST OP INSTRUCTIONS  Always review your discharge instruction sheet given to you by the facility where your surgery was performed.  IF YOU HAVE DISABILITY OR FAMILY LEAVE FORMS, YOU MUST BRING THEM TO THE OFFICE FOR PROCESSING.  PLEASE DO NOT GIVE THEM TO YOUR DOCTOR.  1. A prescription for pain medication may be given to you upon discharge.  Take your pain medication as prescribed, if needed.  If narcotic pain medicine is not needed, then you may take acetaminophen (Tylenol) or ibuprofen (Advil) as needed. 2. Take your usually prescribed medications unless otherwise directed. 3. If you need a refill on your pain medication, please contact your pharmacy. They will contact our office to request authorization.  Prescriptions will not be filled after 5pm or on week-ends. 4. You should follow a light diet the first few days after arrival home, such as soup and crackers, pudding, etc.unless your doctor has advised otherwise. A high-fiber, low fat diet can be resumed as tolerated.   Be sure to include lots of fluids daily. Most patients will experience some swelling and bruising on the chest and neck area.  Ice packs will help.  Swelling and bruising can take several days to resolve 5. Most patients will experience some swelling and bruising in the area of the incision. Ice pack will help. Swelling and bruising can take several days to resolve..  6. It is common to experience some constipation if taking pain medication after surgery.  Increasing fluid intake and taking a stool softener will usually help or prevent this problem from occurring.  A mild laxative (Milk of Magnesia or Miralax) should be taken according to package directions if there are no bowel movements after 48 hours. 7.  Dressing changes daily - you may shower over the dressing.  After the shower, remove the old dressing.  Moisten a 2x2 gauze with saline, then  gently pack it into the open wound.  Cover with dry dressing and tape. 8. ACTIVITIES:  You may resume regular (light) daily activities beginning the next day--such as daily self-care, walking, climbing stairs--gradually increasing activities as tolerated.  You may have sexual intercourse when it is comfortable.  Refrain from any heavy lifting or straining until approved by your doctor. a. You may drive when you no longer are taking prescription pain medication, you can comfortably wear a seatbelt, and you can safely maneuver your car and apply brakes b. Return to Work: ___________________________________ 44. You should see your doctor in the office for a follow-up appointment approximately two weeks after your surgery.  Make sure that you call for this appointment within a day or two after you arrive home to insure a convenient appointment time. OTHER INSTRUCTIONS:  _____________________________________________________________ _____________________________________________________________  WHEN TO CALL YOUR DOCTOR: 1. Fever over 101.0 2. Inability to urinate 3. Nausea and/or vomiting 4. Extreme swelling or bruising 5. Continued bleeding from incision. 6. Increased pain, redness, or drainage from the incision. 7. Difficulty swallowing or breathing 8. Muscle cramping or spasms. 9. Numbness or tingling in hands or feet or around lips.  The clinic staff is available to answer your questions during regular business hours.  Please dont hesitate to call and ask to speak to one of the nurses if you have concerns.  For further questions, please visit [email or web address]

## 2014-10-28 NOTE — Progress Notes (Signed)
2 Days Post-Op  Subjective: Patient had two bowel movements yesterday Diet advanced to fulls - no nausea Still requiring IV pain meds - cramping in lower abdomen/ incisional pain  Objective: Vital signs in last 24 hours: Temp:  [97.8 F (36.6 C)-98.1 F (36.7 C)] 98.1 F (36.7 C) (09/09 0532) Pulse Rate:  [81-90] 90 (09/09 0532) Resp:  [16-17] 16 (09/09 0532) BP: (104-138)/(64-72) 132/72 mmHg (09/09 0532) SpO2:  [98 %-100 %] 100 % (09/09 0532) Last BM Date: 10/25/14  Intake/Output from previous day: 09/08 0701 - 09/09 0700 In: 1062 [P.O.:1062] Out: -  Intake/Output this shift:    General appearance: alert, cooperative and no distress Resp: clear to auscultation bilaterally Cardio: regular rate and rhythm, S1, S2 normal, no murmur, click, rub or gallop GI: + bowel sounds; tender around RLQ incision and lower abdomen Wound - clean; minimal drainage  Lab Results:   Recent Labs  10/27/14 0425  WBC 7.1  HGB 11.3*  HCT 35.3*  PLT 302   BMET  Recent Labs  10/27/14 0425  NA 139  K 3.8  CL 103  CO2 29  GLUCOSE 100*  BUN <5*  CREATININE 0.75  CALCIUM 8.9   PT/INR No results for input(s): LABPROT, INR in the last 72 hours. ABG No results for input(s): PHART, HCO3 in the last 72 hours.  Invalid input(s): PCO2, PO2  Studies/Results: No results found.  Anti-infectives: Anti-infectives    Start     Dose/Rate Route Frequency Ordered Stop   10/26/14 2200  ciprofloxacin (CIPRO) IVPB 400 mg     400 mg 200 mL/hr over 60 Minutes Intravenous Every 12 hours 10/26/14 1457 10/26/14 2241   10/26/14 1830  metroNIDAZOLE (FLAGYL) IVPB 500 mg     500 mg 100 mL/hr over 60 Minutes Intravenous Every 8 hours 10/26/14 1457 10/26/14 1923   10/26/14 1015  metroNIDAZOLE (FLAGYL) IVPB 500 mg     500 mg 100 mL/hr over 60 Minutes Intravenous On call to O.R. 10/26/14 0905 10/26/14 1044   10/26/14 1015  ciprofloxacin (CIPRO) IVPB 400 mg  Status:  Discontinued     400 mg 200 mL/hr  over 60 Minutes Intravenous On call to O.R. 10/26/14 0905 10/26/14 1447   10/26/14 0911  ciprofloxacin (CIPRO) 400 MG/200ML IVPB    Comments:  Sammuel Cooper   : cabinet override      10/26/14 0911 10/26/14 1004      Assessment/Plan: s/p Procedure(s): LOOP ILEOSTOMY REVERSAL (N/A) Advance diet Plan for discharge tomorrow Patient states that they do not need home health nursing for the wet to dry dressing changes.  Will try PO Dilaudid for pain management   LOS: 2 days    Eluterio Seymour K. 10/28/2014

## 2014-10-29 NOTE — Progress Notes (Signed)
[name] to be D/C'd Home per MD order.  Discussed with the patient and all questions fully answered.  VSS, Skin clean, dry and intact without evidence of skin break down, no evidence of skin tears noted. IV catheter discontinued intact. Site without signs and symptoms of complications. Dressing and pressure applied.  An After Visit Summary was printed and given to the patient. Patient received prescription. Dressing change reviewed with teach back  D/c education completed with patient/family including follow up instructions, medication list, d/c activities limitations if indicated, with other d/c instructions as indicated by MD - patient able to verbalize understanding, all questions fully answered.   Patient instructed to return to ED, call 911, or call MD for any changes in condition.   Patient escorted via [place], and D/C home via private auto.  Donn Pierini Brason Berthelot 10/29/2014 1000

## 2014-10-29 NOTE — Discharge Summary (Signed)
Physician Discharge Summary  Patient ID: [name] MRN: [id number] DOB/AGE: 07/14/1970 44 y.o.  Admit date: 10/26/2014 Discharge date: 10/29/2014  Admission Diagnoses:  Discharge Diagnoses:  Active Problems:   Ileostomy present   Discharged Condition: good  Hospital Course: uneventful post op recovery.  Bowel function returned quickly post op  Consults: None  Significant Diagnostic Studies:   Treatments: surgery: takedown loop ileostomy  Discharge Exam: Blood pressure 128/63, pulse 89, temperature 98.9 F (37.2 C), temperature source Oral, resp. rate 19, height 5\' 4"  (1.626 m), weight 90.719 kg (200 lb), SpO2 100 %. General appearance: alert, cooperative and no distress Resp: clear to auscultation bilaterally Cardio: regular rate and rhythm, S1, S2 normal, no murmur, click, rub or gallop Incision/Wound:abdomen soft, RLQ wound clean  Disposition: 01-Home or Self Care     Medication List    TAKE these medications        HAIR/SKIN/NAILS Tabs  Take 1 tablet by mouth daily.     HYDROmorphone 2 MG tablet  Commonly known as:  DILAUDID  Take 1 tablet (2 mg total) by mouth every 4 (four) hours as needed for severe pain.     ibuprofen 800 MG tablet  Commonly known as:  ADVIL,MOTRIN  Take 800 mg by mouth every 8 (eight) hours as needed for moderate pain.     lisinopril 10 MG tablet  Commonly known as:  PRINIVIL,ZESTRIL  Take 10 mg by mouth daily.     ondansetron 4 MG tablet  Commonly known as:  ZOFRAN  Take 1 tablet (4 mg total) by mouth every 8 (eight) hours.     ondansetron 4 MG tablet  Commonly known as:  ZOFRAN  Take 1 tablet (4 mg total) by mouth every 6 (six) hours.     oxyCODONE-acetaminophen 5-325 MG per tablet  Commonly known as:  PERCOCET/ROXICET  Take 1 tablet by mouth every 6 (six) hours as needed for severe pain.     pantoprazole 40 MG tablet  Commonly known as:  PROTONIX  Take 40 mg by mouth daily.     REMIFEMIN 20 MG Tabs  Generic  drug:  Black Cohosh  Take 40 mg by mouth daily.           Follow-up Information    Follow up with Maia Petties., MD. Schedule an appointment as soon as possible for a visit in 2 weeks.   Specialty:  General Surgery   Contact information:   [address] [place] [address] [phone]       Signed: Harl Bowie 10/29/2014, 8:41 AM

## 2014-10-29 NOTE — Progress Notes (Signed)
Patient ID: [name], female   DOB: 1970/04/05, 44 y.o.   MRN: [id number]  Doing well Looks great  Plan: Discharge home

## 2015-02-13 ENCOUNTER — Encounter (HOSPITAL_COMMUNITY): Payer: Self-pay | Admitting: *Deleted

## 2015-02-13 ENCOUNTER — Emergency Department (HOSPITAL_COMMUNITY): Payer: BLUE CROSS/BLUE SHIELD

## 2015-02-13 ENCOUNTER — Emergency Department (HOSPITAL_COMMUNITY)
Admission: EM | Admit: 2015-02-13 | Discharge: 2015-02-13 | Disposition: A | Discharge status: Home / Self Care | Payer: BLUE CROSS/BLUE SHIELD | Attending: Emergency Medicine | Admitting: Emergency Medicine

## 2015-02-13 DIAGNOSIS — Z90711 Acquired absence of uterus with remaining cervical stump: Secondary | ICD-10-CM | POA: Diagnosis not present

## 2015-02-13 DIAGNOSIS — R1011 Right upper quadrant pain: Secondary | ICD-10-CM | POA: Diagnosis not present

## 2015-02-13 DIAGNOSIS — Z79899 Other long term (current) drug therapy: Secondary | ICD-10-CM | POA: Insufficient documentation

## 2015-02-13 DIAGNOSIS — Z86018 Personal history of other benign neoplasm: Secondary | ICD-10-CM | POA: Insufficient documentation

## 2015-02-13 DIAGNOSIS — Z880 Allergy status to penicillin: Secondary | ICD-10-CM | POA: Insufficient documentation

## 2015-02-13 DIAGNOSIS — Z8742 Personal history of other diseases of the female genital tract: Secondary | ICD-10-CM | POA: Diagnosis not present

## 2015-02-13 DIAGNOSIS — Z8709 Personal history of other diseases of the respiratory system: Secondary | ICD-10-CM | POA: Insufficient documentation

## 2015-02-13 DIAGNOSIS — Z8619 Personal history of other infectious and parasitic diseases: Secondary | ICD-10-CM | POA: Diagnosis not present

## 2015-02-13 DIAGNOSIS — I10 Essential (primary) hypertension: Secondary | ICD-10-CM | POA: Insufficient documentation

## 2015-02-13 DIAGNOSIS — Z8719 Personal history of other diseases of the digestive system: Secondary | ICD-10-CM | POA: Insufficient documentation

## 2015-02-13 DIAGNOSIS — Z87891 Personal history of nicotine dependence: Secondary | ICD-10-CM | POA: Diagnosis not present

## 2015-02-13 DIAGNOSIS — Z9889 Other specified postprocedural states: Secondary | ICD-10-CM | POA: Diagnosis not present

## 2015-02-13 DIAGNOSIS — R109 Unspecified abdominal pain: Secondary | ICD-10-CM | POA: Diagnosis present

## 2015-02-13 DIAGNOSIS — R1010 Upper abdominal pain, unspecified: Secondary | ICD-10-CM

## 2015-02-13 LAB — COMPREHENSIVE METABOLIC PANEL
ALT: 20 U/L (ref 14–54)
ANION GAP: 12 (ref 5–15)
AST: 26 U/L (ref 15–41)
Albumin: 4 g/dL (ref 3.5–5.0)
Alkaline Phosphatase: 41 U/L (ref 38–126)
BUN: 6 mg/dL (ref 6–20)
CHLORIDE: 104 mmol/L (ref 101–111)
CO2: 25 mmol/L (ref 22–32)
Calcium: 10 mg/dL (ref 8.9–10.3)
Creatinine, Ser: 0.68 mg/dL (ref 0.44–1.00)
GFR calc non Af Amer: >60 mL/min (ref >60)
Glucose, Bld: 103 mg/dL — ABNORMAL HIGH (ref 65–99)
Potassium: 3.8 mmol/L (ref 3.5–5.1)
SODIUM: 141 mmol/L (ref 135–145)
Total Bilirubin: 0.8 mg/dL (ref 0.3–1.2)
Total Protein: 7 g/dL (ref 6.5–8.1)

## 2015-02-13 LAB — CBC
HEMATOCRIT: 40.1 % (ref 36.0–46.0)
HEMOGLOBIN: 12.8 g/dL (ref 12.0–15.0)
MCH: 29.2 pg (ref 26.0–34.0)
MCHC: 31.9 g/dL (ref 30.0–36.0)
MCV: 91.6 fL (ref 78.0–100.0)
Platelets: 298 10*3/uL (ref 150–400)
RBC: 4.38 MIL/uL (ref 3.87–5.11)
RDW: 12.7 % (ref 11.5–15.5)
WBC: 6.1 10*3/uL (ref 4.0–10.5)

## 2015-02-13 LAB — URINALYSIS, ROUTINE W REFLEX MICROSCOPIC
Bilirubin Urine: NEGATIVE
GLUCOSE, UA: NEGATIVE mg/dL
HGB URINE DIPSTICK: NEGATIVE
Ketones, ur: NEGATIVE mg/dL
Leukocytes, UA: NEGATIVE
Nitrite: NEGATIVE
Protein, ur: NEGATIVE mg/dL
SPECIFIC GRAVITY, URINE: 1.041 — AB (ref 1.005–1.030)
pH: 6.5 (ref 5.0–8.0)

## 2015-02-13 LAB — LIPASE, BLOOD: LIPASE: 30 U/L (ref 11–51)

## 2015-02-13 MED ORDER — HYDROCODONE-ACETAMINOPHEN 5-325 MG PO TABS
2.0000 | ORAL_TABLET | ORAL | Status: DC | PRN
Start: 2015-02-13 — End: 2016-01-30

## 2015-02-13 MED ORDER — IOHEXOL 300 MG/ML  SOLN
100.0000 mL | Freq: Once | INTRAMUSCULAR | Status: AC | PRN
  Administered 2015-02-13: 100 mL via INTRAVENOUS

## 2015-02-13 MED ORDER — HYDROCODONE-ACETAMINOPHEN 5-325 MG PO TABS
2.0000 | ORAL_TABLET | Freq: Once | ORAL | Status: AC
  Administered 2015-02-13: 2 via ORAL
  Filled 2015-02-13: qty 2

## 2015-02-13 NOTE — ED Notes (Signed)
Pt placed on monitor upon return to room from CT. Pt remains monitored by blood pressure and pulse ox. Pt has visitor at bedside.

## 2015-02-13 NOTE — ED Notes (Signed)
Pt reports RUQ pain for several weeks. Pt states that it has worsened over the last week. Pt states that the pain is at her surgical site from September. Denies change on bowel or bladder.

## 2015-02-13 NOTE — ED Notes (Signed)
MD at bedside.Miller 

## 2015-02-13 NOTE — ED Notes (Signed)
Patient transported to CT 

## 2015-02-13 NOTE — Discharge Instructions (Signed)
Hydrocodone for severe pain Drink plenty of fluids

## 2015-02-13 NOTE — ED Provider Notes (Signed)
CSN: [id number]     Arrival date & time 02/13/15  1303 History   First MD Initiated Contact with Patient 02/13/15 1623     Chief Complaint  Patient presents with  . Abdominal Pain     (Consider location/radiation/quality/duration/timing/severity/associated sxs/prior Treatment) HPI Comments: The patient is a 44 year old female, she has had a recent difficult medical history including diverticulitis with perforation, required diverting ileostomy and then an anastomosis recently. Since that time she has had episodes of episodic, spasmodic pain at the surgical site in the right mid abdomen. This has been slightly worse over the last 24 hours, it goes and comes, most of the time she is feeling very comfortable without any abnormal feelings. She denies fevers, vomiting, diarrhea and has had normal bowel movements including today. Symptoms are very mild at this time. There is no dysuria, no coughing or shortness of breath. She has been in contact with the surgeon who has requested that she use heat and cold packs, thinks this may be related to a nerve, muscles strain or to possible hernia at the surgical site  Patient is a 44 y.o. female presenting with abdominal pain. The history is provided by the patient.  Abdominal Pain   Past Medical History  Diagnosis Date  . History of syphilis   . Breast fibroadenoma   . History of ectopic pregnancy 2005    r salpyngectomy  . History of conization of cervix 2000  . H/O myomectomy 07-2006    robotic  . History of hysterectomy, supracervical 08-2008    robotic  . PMS (premenstrual syndrome)   . Back pain   . H/O sinusitis   . GERD (gastroesophageal reflux disease)   . Hx of herpes simplex type 2 infection   . History of syphilis   . Abnormal Pap smear 2000    Cone Biopsy  . H/O metrorrhagia 12/2006  . Hypertension   . Headache(784.0)   . Diverticulitis of colon with perforation 05/20/2014   Past Surgical History  Procedure Laterality Date  .  Cervical cone biopsy  2000  . Ectopic pregnancy surgery  2005    R salpyngectomy  . Myomectomy  2008    robotic  . Robotic assisted lap vaginal hysterectomy  2010    supracervical  . Polypectomy  2009  . Salpingectomy  2005  . Dilation and curettage of uterus  2009  . Abdominal hysterectomy    . Breast surgery  2004    fibroademoma  . Breast lumpectomy  10/11    rt-neg  . Trigger finger release Right 09/28/2012    Procedure: RELEASE TRIGGER FINGER/A-1 PULLEY RIGHT THUMB;  Surgeon: Jolyn Nap, MD;  Location: [facility];  Service: Orthopedics;  Laterality: Right;  . Laparoscopic sigmoid colectomy N/A 06/30/2014    Procedure: LAPAROSCOPIC ASSISTED SIGMOID COLECTOMY;  Surgeon: Donnie Mesa, MD;  Location: [facility];  Service: General;  Laterality: N/A;  . Colon resection N/A 07/06/2014    Procedure: LAPAROSCOPIC LOOP ILEOSTOMY WITH PLACEMENT OF PELVIC DRAIN;  Surgeon: Donnie Mesa, MD;  Location: [facility];  Service: General;  Laterality: N/A;  . Colon surgery    . Diverting ileostomy Right 06/2014  . Ileostomy N/A 10/26/2014    Procedure: LOOP ILEOSTOMY REVERSAL;  Surgeon: Donnie Mesa, MD;  Location: [facility];  Service: General;  Laterality: N/A;   Family History  Problem Relation Age of Onset  . Hypertension Paternal Grandmother   . Diabetes Paternal Grandmother   . Hypertension Maternal Grandmother   .  Sarcoidosis Mother   . Hypertension Mother   . Thyroid disease Maternal Aunt   . Mental illness Cousin    Social History  Substance Use Topics  . Smoking status: Former Smoker -- 0.25 packs/day for 15 years    Types: Cigarettes    Quit date: 06/12/2011  . Smokeless tobacco: Never Used  . Alcohol Use: No   OB History    Gravida Para Term Preterm AB TAB SAB Ectopic Multiple Living   4 2 2  2 1  0 1 0 2     Review of Systems  Gastrointestinal: Positive for abdominal pain.  All other systems reviewed and are negative.     Allergies  Keflex and  Penicillins  Home Medications   Prior to Admission medications   Medication Sig Start Date End Date Taking? Authorizing Provider  Black Cohosh (REMIFEMIN) 20 MG TABS Take 40 mg by mouth daily.   Yes Historical Provider, MD  fluticasone (FLONASE) 50 MCG/ACT nasal spray Place 1 spray into both nostrils daily as needed for allergies.  02/03/15  Yes Historical Provider, MD  ibuprofen (ADVIL,MOTRIN) 800 MG tablet Take 800 mg by mouth every 8 (eight) hours as needed for moderate pain.   Yes Historical Provider, MD  lisinopril (PRINIVIL,ZESTRIL) 10 MG tablet Take 10 mg by mouth daily.   Yes Historical Provider, MD  ondansetron (ZOFRAN) 4 MG tablet Take 1 tablet (4 mg total) by mouth every 6 (six) hours. 10/15/14  Yes Delos Haring, PA-C  HYDROcodone-acetaminophen (NORCO/VICODIN) 5-325 MG tablet Take 2 tablets by mouth every 4 (four) hours as needed. 02/13/15   Noemi Chapel, MD   BP 144/77 mmHg  Pulse 80  Temp(Src) 97.7 F (36.5 C) (Oral)  Resp 16  Ht 5\' 4"  (1.626 m)  Wt 196 lb (88.905 kg)  BMI 33.63 kg/m2  SpO2 100% Physical Exam  Constitutional: She appears well-developed and well-nourished. No distress.  HENT:  Head: Normocephalic and atraumatic.  Mouth/Throat: Oropharynx is clear and moist. No oropharyngeal exudate.  Eyes: Conjunctivae and EOM are normal. Pupils are equal, round, and reactive to light. Right eye exhibits no discharge. Left eye exhibits no discharge. No scleral icterus.  Neck: Normal range of motion. Neck supple. No JVD present. No thyromegaly present.  Cardiovascular: Normal rate, regular rhythm, normal heart sounds and intact distal pulses.  Exam reveals no gallop and no friction rub.   No murmur heard. Pulmonary/Chest: Effort normal and breath sounds normal. No respiratory distress. She has no wheezes. She has no rales.  Abdominal: Soft. Bowel sounds are normal. She exhibits no distension and no mass. There is tenderness ( Right mid abdomen with surgical scar which is  healing, it is closed, there is no surrounding redness or induration, tenderness just superior to this area, no masses palpated).  Otherwise the abdomen is soft, obese, nontender  Musculoskeletal: Normal range of motion. She exhibits no edema or tenderness.  Lymphadenopathy:    She has no cervical adenopathy.  Neurological: She is alert. Coordination normal.  Skin: Skin is warm and dry. No rash noted. No erythema.  Psychiatric: She has a normal mood and affect. Her behavior is normal.  Nursing note and vitals reviewed.   ED Course  Procedures (including critical care time) Labs Review Labs Reviewed  COMPREHENSIVE METABOLIC PANEL - Abnormal; Notable for the following:    Glucose, Bld 103 (*)    All other components within normal limits  URINALYSIS, ROUTINE W REFLEX MICROSCOPIC (NOT AT [facility]) - Abnormal; Notable for the following:  Specific Gravity, Urine 1.041 (*)    All other components within normal limits  LIPASE, BLOOD  CBC    Imaging Review Ct Abdomen Pelvis W Contrast  02/13/2015  CLINICAL DATA:  Right upper quadrant pain. EXAM: CT ABDOMEN AND PELVIS WITH CONTRAST TECHNIQUE: Multidetector CT imaging of the abdomen and pelvis was performed using the standard protocol following bolus administration of intravenous contrast. CONTRAST:  137mL OMNIPAQUE IOHEXOL 300 MG/ML  SOLN COMPARISON:  10/15/2014. FINDINGS: Lower chest:  No pleural fluid identified.  Lung bases are clear. Hepatobiliary: No suspicious liver abnormality identified. The gallbladder is normal. No biliary dilatation. Pancreas: Negative. Spleen: Negative. Adrenals/Urinary Tract: The adrenal glands are both normal. Unremarkable appearance of the kidneys. The urinary bladder is normal. Stomach/Bowel: Normal appearance of the stomach. The small bowel loops have a normal caliber. The appendix is visualized within the right side of pelvis. On today's study this measures 8 mm, image 74 of series 2. This is increased from 3 mm  previously. No periappendiceal free fluid or fluid collections identified. Evidence of multiple bowel re- sections involving the distal small bowel as well as the sigmoid colon. No pathologic dilatation of the bowel loops identified Vascular/Lymphatic: Calcified atherosclerotic disease involves the abdominal aorta. No aneurysm. No enlarged retroperitoneal or mesenteric adenopathy. No enlarged pelvic or inguinal lymph nodes. Reproductive: The patient is status post hysterectomy. No adnexal mass identified. Within the right adnexal region there is a round structure measuring 2.4 x 2.2 cm and 24 HU, image 66/series 2. The appearance favors of right ovary. A similar appearing structure is identified within the left adnexa. Other: No free fluid or fluid collections identified within the abdomen or pelvis. Previous ileostomy site within the right lower quadrant is noted. No fluid collection associated with this area noted. Musculoskeletal: No aggressive lytic or sclerotic bone lesions. IMPRESSION: 1. The appendix appears mildly thickened measuring 8 mm (normal is 6 mm or less). No periappendiceal free fluid or fluid collections identified. The appendix is increased in diameter when compared with study from 10/15/2014. Cannot rule out acute appendicitis and careful clinical correlation is recommended. 2. No complications associated with ileostomy site within the right abdomen. Electronically Signed   By: Kerby Moors M.D.   On: 02/13/2015 17:48   I have personally reviewed and evaluated these images and lab results as part of my medical decision-making.    MDM   Final diagnoses:  Pain of upper abdomen    The patient is well-appearing, normal vitals, no leukocytosis, patient is worried about hernia, this is a possibility given the recent surgery and recurrent abdominal surgeries this could also be related to intermittent bowel obstructions, she appears very well at this time so that makes it less likely. We'll  obtain CT scan, patient declines pain medications at this time.-  CT reviewed with Dr. Georganna Skeans who agrees with outpt f/u and pain control - doubt acute appy given that her stuttering sx for the week without RLQ pain would match appendicitis and without stranding or fluid around the appendix, makes it less likely to.  She has minimal mid abd ttp to the R mid abd - states is same pain she has had all week.  D/w pt extensively re: observation vs home with f/u in 12 hours for f/u - she is agreeable to the latter and will seek medical exam / abd exam in next 12 hours.  Labs revewed and show only mild dehydration - no leukocytosis,  Pt agreeable to 12 hour f/u  Meds given in ED:  Medications  iohexol (OMNIPAQUE) 300 MG/ML solution 100 mL (100 mLs Intravenous Contrast Given 02/13/15 1706)  HYDROcodone-acetaminophen (NORCO/VICODIN) 5-325 MG per tablet 2 tablet (2 tablets Oral Given 02/13/15 1850)    New Prescriptions   HYDROCODONE-ACETAMINOPHEN (NORCO/VICODIN) 5-325 MG TABLET    Take 2 tablets by mouth every 4 (four) hours as needed.      Noemi Chapel, MD 02/13/15 [phone]

## 2015-06-25 ENCOUNTER — Encounter (HOSPITAL_COMMUNITY): Payer: Self-pay | Admitting: Family Medicine

## 2015-06-25 ENCOUNTER — Emergency Department (HOSPITAL_COMMUNITY)
Admission: EM | Admit: 2015-06-25 | Discharge: 2015-06-25 | Disposition: A | Discharge status: Home / Self Care | Payer: BLUE CROSS/BLUE SHIELD | Attending: Emergency Medicine | Admitting: Emergency Medicine

## 2015-06-25 DIAGNOSIS — K625 Hemorrhage of anus and rectum: Secondary | ICD-10-CM | POA: Diagnosis present

## 2015-06-25 DIAGNOSIS — Z79899 Other long term (current) drug therapy: Secondary | ICD-10-CM | POA: Insufficient documentation

## 2015-06-25 DIAGNOSIS — Z8742 Personal history of other diseases of the female genital tract: Secondary | ICD-10-CM | POA: Diagnosis not present

## 2015-06-25 DIAGNOSIS — K649 Unspecified hemorrhoids: Secondary | ICD-10-CM

## 2015-06-25 DIAGNOSIS — K644 Residual hemorrhoidal skin tags: Secondary | ICD-10-CM | POA: Insufficient documentation

## 2015-06-25 DIAGNOSIS — Z880 Allergy status to penicillin: Secondary | ICD-10-CM | POA: Insufficient documentation

## 2015-06-25 DIAGNOSIS — I10 Essential (primary) hypertension: Secondary | ICD-10-CM | POA: Diagnosis not present

## 2015-06-25 DIAGNOSIS — Z8719 Personal history of other diseases of the digestive system: Secondary | ICD-10-CM

## 2015-06-25 DIAGNOSIS — Z87891 Personal history of nicotine dependence: Secondary | ICD-10-CM | POA: Diagnosis not present

## 2015-06-25 DIAGNOSIS — Z86018 Personal history of other benign neoplasm: Secondary | ICD-10-CM | POA: Diagnosis not present

## 2015-06-25 DIAGNOSIS — Z8619 Personal history of other infectious and parasitic diseases: Secondary | ICD-10-CM | POA: Diagnosis not present

## 2015-06-25 LAB — URINALYSIS, ROUTINE W REFLEX MICROSCOPIC
BILIRUBIN URINE: NEGATIVE
GLUCOSE, UA: NEGATIVE mg/dL
Hgb urine dipstick: NEGATIVE
KETONES UR: 40 mg/dL — AB
Leukocytes, UA: NEGATIVE
Nitrite: NEGATIVE
PH: 5.5 (ref 5.0–8.0)
Protein, ur: NEGATIVE mg/dL
Specific Gravity, Urine: 1.029 (ref 1.005–1.030)

## 2015-06-25 LAB — COMPREHENSIVE METABOLIC PANEL
ALBUMIN: 4.1 g/dL (ref 3.5–5.0)
ALT: 27 U/L (ref 14–54)
AST: 26 U/L (ref 15–41)
Alkaline Phosphatase: 44 U/L (ref 38–126)
Anion gap: 14 (ref 5–15)
BILIRUBIN TOTAL: 0.7 mg/dL (ref 0.3–1.2)
BUN: 7 mg/dL (ref 6–20)
CHLORIDE: 100 mmol/L — AB (ref 101–111)
CO2: 26 mmol/L (ref 22–32)
Calcium: 10.1 mg/dL (ref 8.9–10.3)
Creatinine, Ser: 0.85 mg/dL (ref 0.44–1.00)
GFR calc Af Amer: >60 mL/min (ref >60)
GFR calc non Af Amer: >60 mL/min (ref >60)
GLUCOSE: 97 mg/dL (ref 65–99)
POTASSIUM: 4.1 mmol/L (ref 3.5–5.1)
Sodium: 140 mmol/L (ref 135–145)
Total Protein: 7.8 g/dL (ref 6.5–8.1)

## 2015-06-25 LAB — LIPASE, BLOOD: LIPASE: 38 U/L (ref 11–51)

## 2015-06-25 LAB — POC OCCULT BLOOD, ED: Fecal Occult Bld: POSITIVE — AB

## 2015-06-25 LAB — CBC
HEMATOCRIT: 42 % (ref 36.0–46.0)
Hemoglobin: 13.6 g/dL (ref 12.0–15.0)
MCH: 29.4 pg (ref 26.0–34.0)
MCHC: 32.4 g/dL (ref 30.0–36.0)
MCV: 90.9 fL (ref 78.0–100.0)
Platelets: 357 10*3/uL (ref 150–400)
RBC: 4.62 MIL/uL (ref 3.87–5.11)
RDW: 12.9 % (ref 11.5–15.5)
WBC: 6.3 10*3/uL (ref 4.0–10.5)

## 2015-06-25 NOTE — Discharge Instructions (Signed)
There does not appear to be an emergent cause for your symptoms at this time. Your exam was reassuring as were your labs. Please follow-up with your gastroenterologist next week for reevaluation. Return to ED for new or worsening symptoms as we discussed.  Hemorrhoids Hemorrhoids are swollen veins around the rectum or anus. There are two types of hemorrhoids:   Internal hemorrhoids. These occur in the veins just inside the rectum. They may poke through to the outside and become irritated and painful.  External hemorrhoids. These occur in the veins outside the anus and can be felt as a painful swelling or hard lump near the anus. CAUSES  Pregnancy.   Obesity.   Constipation or diarrhea.   Straining to have a bowel movement.   Sitting for long periods on the toilet.  Heavy lifting or other activity that caused you to strain.  Anal intercourse. SYMPTOMS   Pain.   Anal itching or irritation.   Rectal bleeding.   Fecal leakage.   Anal swelling.   One or more lumps around the anus.  DIAGNOSIS  Your caregiver may be able to diagnose hemorrhoids by visual examination. Other examinations or tests that may be performed include:   Examination of the rectal area with a gloved hand (digital rectal exam).   Examination of anal canal using a small tube (scope).   A blood test if you have lost a significant amount of blood.  A test to look inside the colon (sigmoidoscopy or colonoscopy). TREATMENT Most hemorrhoids can be treated at home. However, if symptoms do not seem to be getting better or if you have a lot of rectal bleeding, your caregiver may perform a procedure to help make the hemorrhoids get smaller or remove them completely. Possible treatments include:   Placing a rubber band at the base of the hemorrhoid to cut off the circulation (rubber band ligation).   Injecting a chemical to shrink the hemorrhoid (sclerotherapy).   Using a tool to burn the  hemorrhoid (infrared light therapy).   Surgically removing the hemorrhoid (hemorrhoidectomy).   Stapling the hemorrhoid to block blood flow to the tissue (hemorrhoid stapling).  HOME CARE INSTRUCTIONS   Eat foods with fiber, such as whole grains, beans, nuts, fruits, and vegetables. Ask your doctor about taking products with added fiber in them (fibersupplements).  Increase fluid intake. Drink enough water and fluids to keep your urine clear or pale yellow.   Exercise regularly.   Go to the bathroom when you have the urge to have a bowel movement. Do not wait.   Avoid straining to have bowel movements.   Keep the anal area dry and clean. Use wet toilet paper or moist towelettes after a bowel movement.   Medicated creams and suppositories may be used or applied as directed.   Only take over-the-counter or prescription medicines as directed by your caregiver.   Take warm sitz baths for 15-20 minutes, 3-4 times a day to ease pain and discomfort.   Place ice packs on the hemorrhoids if they are tender and swollen. Using ice packs between sitz baths may be helpful.   Put ice in a plastic bag.   Place a towel between your skin and the bag.   Leave the ice on for 15-20 minutes, 3-4 times a day.   Do not use a donut-shaped pillow or sit on the toilet for long periods. This increases blood pooling and pain.  SEEK MEDICAL CARE IF:  You have increasing pain and swelling that  is not controlled by treatment or medicine.  You have uncontrolled bleeding.  You have difficulty or you are unable to have a bowel movement.  You have pain or inflammation outside the area of the hemorrhoids. MAKE SURE YOU:  Understand these instructions.  Will watch your condition.  Will get help right away if you are not doing well or get worse.   This information is not intended to replace advice given to you by your health care provider. Make sure you discuss any questions you have  with your health care provider.   Document Released: 02/02/2000 Document Revised: 01/22/2012 Document Reviewed: 12/10/2011 Elsevier Interactive Patient Education [employer].

## 2015-06-25 NOTE — ED Provider Notes (Signed)
CSN: [id number]     Arrival date & time 06/25/15  1559 History   First MD Initiated Contact with Patient 06/25/15 1838     Chief Complaint  Patient presents with  . Rectal Bleeding     (Consider location/radiation/quality/duration/timing/severity/associated sxs/prior Treatment) HPI [name] is a 45 y.o. female with a history of diverticulitis with perforation and subsequent resection, comes in for evaluation of rectal bleeding. Patient reports she noticed a small quarter size area of blood on her toilet paper today when she wiped. Denies any overt hematochezia or melena. She was concerned because of her history of diverticulitis. She reports mild abdominal discomfort, but not similar to previous with diverticulitis. Denies any other fevers, chills, nausea or vomiting, urinary symptoms. No anticoagulation. No recent travel, hospitalizations, antibiotic use. Has not tried anything to improve symptoms. No other alleviating or aggravating factors. Denies any rectal pain, admits to being told she had hemorrhoids during her last colonoscopy.  Past Medical History  Diagnosis Date  . History of syphilis   . Breast fibroadenoma   . History of ectopic pregnancy 2005    r salpyngectomy  . History of conization of cervix 2000  . H/O myomectomy 07-2006    robotic  . History of hysterectomy, supracervical 08-2008    robotic  . PMS (premenstrual syndrome)   . Back pain   . H/O sinusitis   . GERD (gastroesophageal reflux disease)   . Hx of herpes simplex type 2 infection   . History of syphilis   . Abnormal Pap smear 2000    [facility] Biopsy  . H/O metrorrhagia 12/2006  . Hypertension   . Headache(784.0)   . Diverticulitis of colon with perforation 05/20/2014   Past Surgical History  Procedure Laterality Date  . Cervical cone biopsy  2000  . Ectopic pregnancy surgery  2005    R salpyngectomy  . Myomectomy  2008    robotic  . Robotic assisted lap vaginal hysterectomy  2010   supracervical  . Polypectomy  2009  . Salpingectomy  2005  . Dilation and curettage of uterus  2009  . Abdominal hysterectomy    . Breast surgery  2004    fibroademoma  . Breast lumpectomy  10/11    rt-neg  . Trigger finger release Right 09/28/2012    Procedure: RELEASE TRIGGER FINGER/A-1 PULLEY RIGHT THUMB;  Surgeon: Jolyn Nap, MD;  Location: [facility];  Service: Orthopedics;  Laterality: Right;  . Laparoscopic sigmoid colectomy N/A 06/30/2014    Procedure: LAPAROSCOPIC ASSISTED SIGMOID COLECTOMY;  Surgeon: Donnie Mesa, MD;  Location: [facility];  Service: General;  Laterality: N/A;  . Colon resection N/A 07/06/2014    Procedure: LAPAROSCOPIC LOOP ILEOSTOMY WITH PLACEMENT OF PELVIC DRAIN;  Surgeon: Donnie Mesa, MD;  Location: [facility];  Service: General;  Laterality: N/A;  . Colon surgery    . Diverting ileostomy Right 06/2014  . Ileostomy N/A 10/26/2014    Procedure: LOOP ILEOSTOMY REVERSAL;  Surgeon: Donnie Mesa, MD;  Location[facility];  Service: General;  Laterality: N/A;   Family History  Problem Relation Age of Onset  . Hypertension Paternal Grandmother   . Diabetes Paternal Grandmother   . Hypertension Maternal Grandmother   . Sarcoidosis Mother   . Hypertension Mother   . Thyroid disease Maternal Aunt   . Mental illness Cousin    Social History  Substance Use Topics  . Smoking status: Former Smoker -- 0.25 packs/day for 15 years    Types: Cigarettes  Quit date: 06/12/2011  . Smokeless tobacco: Never Used  . Alcohol Use: No   OB History    Gravida Para Term Preterm AB TAB SAB Ectopic Multiple Living   4 2 2  2 1  0 1 0 2     Review of Systems A 10 point review of systems was completed and was negative except for pertinent positives and negatives as mentioned in the history of present illness     Allergies  Keflex and Penicillins  Home Medications   Prior to Admission medications   Medication Sig Start Date End Date Taking? Authorizing  Provider  Black Cohosh (REMIFEMIN) 20 MG TABS Take 40 mg by mouth daily.    Historical Provider, MD  fluticasone (FLONASE) 50 MCG/ACT nasal spray Place 1 spray into both nostrils daily as needed for allergies.  02/03/15   Historical Provider, MD  HYDROcodone-acetaminophen (NORCO/VICODIN) 5-325 MG tablet Take 2 tablets by mouth every 4 (four) hours as needed. 02/13/15   Noemi Chapel, MD  ibuprofen (ADVIL,MOTRIN) 800 MG tablet Take 800 mg by mouth every 8 (eight) hours as needed for moderate pain.    Historical Provider, MD  lisinopril (PRINIVIL,ZESTRIL) 10 MG tablet Take 10 mg by mouth daily.    Historical Provider, MD  ondansetron (ZOFRAN) 4 MG tablet Take 1 tablet (4 mg total) by mouth every 6 (six) hours. 10/15/14   Tiffany Carlota Raspberry, PA-C   BP 159/81 mmHg  Pulse 77  Temp(Src) 98.7 F (37.1 C) (Oral)  Resp 18  SpO2 98% Physical Exam  Constitutional: She is oriented to person, place, and time. She appears well-developed and well-nourished.  HENT:  Head: Normocephalic and atraumatic.  Mouth/Throat: Oropharynx is clear and moist.  Eyes: Conjunctivae are normal. Pupils are equal, round, and reactive to light. Right eye exhibits no discharge. Left eye exhibits no discharge. No scleral icterus.  Neck: Neck supple.  Cardiovascular: Normal rate, regular rhythm and normal heart sounds.   Pulmonary/Chest: Effort normal and breath sounds normal. No respiratory distress. She has no wheezes. She has no rales.  Abdominal: Soft.  Very mild tenderness in left abdomen. No distention, rebound or guarding or peritoneal signs.  Genitourinary:  Female chaperone present for rectal exam. Small external hemorrhoid noted. Soft brown stool on exam glove.  Musculoskeletal: She exhibits no tenderness.  Neurological: She is alert and oriented to person, place, and time.  Cranial Nerves II-XII grossly intact  Skin: Skin is warm and dry. No rash noted.  Psychiatric: She has a normal mood and affect.  Nursing note and  vitals reviewed.   ED Course  Procedures (including critical care time) Labs Review Labs Reviewed  COMPREHENSIVE METABOLIC PANEL - Abnormal; Notable for the following:    Chloride 100 (*)    All other components within normal limits  URINALYSIS, ROUTINE W REFLEX MICROSCOPIC (NOT AT [facility]) - Abnormal; Notable for the following:    Ketones, ur 40 (*)    All other components within normal limits  POC OCCULT BLOOD, ED - Abnormal; Notable for the following:    Fecal Occult Bld POSITIVE (*)    All other components within normal limits  LIPASE, BLOOD  CBC  OCCULT BLOOD X 1 CARD TO LAB, STOOL    Imaging Review No results found. I have personally reviewed and evaluated these images and lab results as part of my medical decision-making.   EKG Interpretation None     Filed Vitals:   06/25/15 1607 06/25/15 1947 06/25/15 1950 06/25/15 2001  BP: 168/103  159/81    Pulse: 100 77    Temp: 98.3 F (36.8 C)   98.7 F (37.1 C)  TempSrc:    Oral  Resp: 18     SpO2: 100% 99% 98%     MDM  [name] is a 45 y.o. female with a history of diverticulitis with perforation and resection here for rectal bleeding. On arrival she is hemodynamically stable, afebrile. Clinical presentation consistent with hemorrhoid. Low suspicion for acute diverticulitis. No leukocytosis or anemia. Screening labs otherwise not concerning. Discussed treatment option for diverticulitis with antibiotics, patient politely declines and prefers to follow-up with her gastroenterologist, Dr. Oletta Lamas next week. I feel this is a reasonable plan. Patient did not take blood pressure medicine today, likely source for patient's hypertension today, encouraged her to take medicine when she goes home. Prior to discharge, I discussed and reviewed this case with my attending, Dr. Winfred Leeds, who also saw and evaluated the patient and agrees with above plan. Final diagnoses:  Hemorrhoids, unspecified hemorrhoid type  History of  rectal bleeding        Comer Locket, PA-C 06/25/15 2016  Orlie Dakin, MD 06/26/15 [id number]

## 2015-06-25 NOTE — ED Notes (Signed)
Patient states she noticed trace amounts of blood on tissue paper when wiping after a BM today.  Patient states she did not see blood in the toilet.  Denies dizziness, shortness of breath.  Patient ambulated without assistance from triage to treatment room.

## 2015-06-25 NOTE — ED Provider Notes (Signed)
Patient had rectal bleeding today. She reports "about the size of a quarter." Brown stool mixed with red blood. She also complains of mild left-sided abdominal pain onset earlier today.. Pain is intermittent lasting one or 2 minutes at a time. She denies fever denies vomiting she is presently hungry on exam no distress lungs clear to auscultation heart regular rate and rhythm abdomen obese, normoactive bowel sounds minimally tender at left upper and left lower quadrants. Patient did not take her blood pressure medications earlier today. Mr.Cartner discussed with her prescribed antibiotics to treat possible early diverticulitis presumptively. She declined. I'm okay with that decision. She has no fever no leukocytosis, and minimal symptoms. Avandia blood pressure explained by her not taking her antihypertensive medication this morning as is prescribed for her  Orlie Dakin, MD 06/25/15 [phone]

## 2015-06-25 NOTE — ED Notes (Signed)
Pt sts this am she had a BM and there was some blood in it. sts loose stool 4 times today with trace blood and cramping. sts diverticulitis.

## 2015-08-07 DIAGNOSIS — M1811 Unilateral primary osteoarthritis of first carpometacarpal joint, right hand: Secondary | ICD-10-CM | POA: Insufficient documentation

## 2015-08-07 DIAGNOSIS — M65341 Trigger finger, right ring finger: Secondary | ICD-10-CM | POA: Insufficient documentation

## 2015-09-01 ENCOUNTER — Other Ambulatory Visit: Payer: Self-pay | Admitting: Obstetrics and Gynecology

## 2015-09-01 DIAGNOSIS — Z1231 Encounter for screening mammogram for malignant neoplasm of breast: Secondary | ICD-10-CM

## 2015-09-08 ENCOUNTER — Ambulatory Visit: Payer: Self-pay

## 2015-10-17 ENCOUNTER — Ambulatory Visit: Payer: Self-pay

## 2016-01-29 ENCOUNTER — Encounter (HOSPITAL_COMMUNITY): Payer: Self-pay | Admitting: Emergency Medicine

## 2016-01-29 DIAGNOSIS — Z87891 Personal history of nicotine dependence: Secondary | ICD-10-CM | POA: Diagnosis not present

## 2016-01-29 DIAGNOSIS — Z8719 Personal history of other diseases of the digestive system: Secondary | ICD-10-CM | POA: Insufficient documentation

## 2016-01-29 DIAGNOSIS — R1032 Left lower quadrant pain: Secondary | ICD-10-CM | POA: Diagnosis not present

## 2016-01-29 DIAGNOSIS — Z79899 Other long term (current) drug therapy: Secondary | ICD-10-CM | POA: Diagnosis not present

## 2016-01-29 DIAGNOSIS — I10 Essential (primary) hypertension: Secondary | ICD-10-CM | POA: Insufficient documentation

## 2016-01-29 LAB — COMPREHENSIVE METABOLIC PANEL
ALT: 28 U/L (ref 14–54)
AST: 26 U/L (ref 15–41)
Albumin: 4.3 g/dL (ref 3.5–5.0)
Alkaline Phosphatase: 41 U/L (ref 38–126)
Anion gap: 8 (ref 5–15)
BILIRUBIN TOTAL: 0.4 mg/dL (ref 0.3–1.2)
BUN: 8 mg/dL (ref 6–20)
CO2: 29 mmol/L (ref 22–32)
CREATININE: 0.94 mg/dL (ref 0.44–1.00)
Calcium: 10 mg/dL (ref 8.9–10.3)
Chloride: 103 mmol/L (ref 101–111)
Glucose, Bld: 104 mg/dL — ABNORMAL HIGH (ref 65–99)
Potassium: 4.3 mmol/L (ref 3.5–5.1)
Sodium: 140 mmol/L (ref 135–145)
TOTAL PROTEIN: 7.6 g/dL (ref 6.5–8.1)

## 2016-01-29 LAB — CBC
HCT: 41.3 % (ref 36.0–46.0)
Hemoglobin: 13.5 g/dL (ref 12.0–15.0)
MCH: 29.6 pg (ref 26.0–34.0)
MCHC: 32.7 g/dL (ref 30.0–36.0)
MCV: 90.6 fL (ref 78.0–100.0)
PLATELETS: 341 10*3/uL (ref 150–400)
RBC: 4.56 MIL/uL (ref 3.87–5.11)
RDW: 12.7 % (ref 11.5–15.5)
WBC: 8 10*3/uL (ref 4.0–10.5)

## 2016-01-29 LAB — URINALYSIS, ROUTINE W REFLEX MICROSCOPIC
Bilirubin Urine: NEGATIVE
Glucose, UA: NEGATIVE mg/dL
Hgb urine dipstick: NEGATIVE
KETONES UR: NEGATIVE mg/dL
LEUKOCYTES UA: NEGATIVE
NITRITE: NEGATIVE
PROTEIN: NEGATIVE mg/dL
Specific Gravity, Urine: 1.025 (ref 1.005–1.030)
pH: 5 (ref 5.0–8.0)

## 2016-01-29 LAB — LIPASE, BLOOD: Lipase: 34 U/L (ref 11–51)

## 2016-01-29 NOTE — ED Triage Notes (Signed)
Patient reports left lateral abdominal pain with nausea onset this morning , denies emesis , mild loose stools , No fever or chills.

## 2016-01-30 ENCOUNTER — Emergency Department (HOSPITAL_COMMUNITY): Payer: BLUE CROSS/BLUE SHIELD

## 2016-01-30 ENCOUNTER — Emergency Department (HOSPITAL_COMMUNITY)
Admission: EM | Admit: 2016-01-30 | Discharge: 2016-01-30 | Disposition: A | Discharge status: Home / Self Care | Payer: BLUE CROSS/BLUE SHIELD | Attending: Emergency Medicine | Admitting: Emergency Medicine

## 2016-01-30 DIAGNOSIS — R1032 Left lower quadrant pain: Secondary | ICD-10-CM

## 2016-01-30 MED ORDER — MORPHINE SULFATE (PF) 4 MG/ML IV SOLN
4.0000 mg | Freq: Once | INTRAVENOUS | Status: AC
Start: 2016-01-30 — End: 2016-01-30
  Administered 2016-01-30: 4 mg via INTRAVENOUS
  Filled 2016-01-30: qty 1

## 2016-01-30 MED ORDER — IOPAMIDOL (ISOVUE-300) INJECTION 61%
INTRAVENOUS | Status: AC
  Administered 2016-01-30: 100 mL via INTRAVENOUS
  Filled 2016-01-30: qty 100

## 2016-01-30 MED ORDER — ONDANSETRON HCL 4 MG/2ML IJ SOLN
4.0000 mg | Freq: Once | INTRAMUSCULAR | Status: AC
  Administered 2016-01-30: 4 mg via INTRAVENOUS
  Filled 2016-01-30: qty 2

## 2016-01-30 MED ORDER — DICYCLOMINE HCL 20 MG PO TABS: 20.0000 mg | ORAL_TABLET | Freq: Two times a day (BID) | ORAL | 0 refills | Status: DC

## 2016-01-30 MED ORDER — MORPHINE SULFATE (PF) 4 MG/ML IV SOLN
4.0000 mg | Freq: Once | INTRAVENOUS | Status: AC
  Administered 2016-01-30: 4 mg via INTRAVENOUS
  Filled 2016-01-30: qty 1

## 2016-01-30 MED ORDER — IOPAMIDOL (ISOVUE-300) INJECTION 61%
INTRAVENOUS | Status: AC
  Filled 2016-01-30: qty 30

## 2016-01-30 MED ORDER — ONDANSETRON 4 MG PO TBDP: 4.0000 mg | ORAL_TABLET | Freq: Three times a day (TID) | ORAL | 0 refills | Status: DC | PRN

## 2016-01-30 MED ORDER — SODIUM CHLORIDE 0.9 % IV BOLUS (SEPSIS)
1000.0000 mL | Freq: Once | INTRAVENOUS | Status: AC
  Administered 2016-01-30: 1000 mL via INTRAVENOUS

## 2016-01-30 NOTE — ED Notes (Signed)
Pt ambulated to bathroom without assistance 

## 2016-01-30 NOTE — ED Provider Notes (Signed)
[facility] DEPT Provider Note   CSN: [id number] Arrival date & time: 01/29/16  2040     History   Chief Complaint Chief Complaint  Patient presents with  . Abdominal Pain    HPI [name] is a 45 y.o. female.  HPI   Patient is a 45 year old female with history of GERD, hypertension, diverticulitis resulting in colon resection and ileostomy (reversal surgery performed 10/2014), partial hysterectomy who presents to the ED with complaint of left lower abdominal pain, onset this morning. Patient reports when she woke up this morning she had constant aching/cramping pain to her left lower quadrant. She reports after having 2-3 loose bowel movements her abdominal pain resolved. She reports around 4 PM her belly pain returned and has been constant since. Denies any aggravating or alleviating factors. Patient reports her pain feels similar to when she has had episodes of diverticulitis in the past. Endorses associated nausea. Denies fever, chills, chest pain, shortness of breath, vomiting, diarrhea, constipation, urinary symptoms, vaginal bleeding, vaginal discharge.  Past Medical History:  Diagnosis Date  . Abnormal Pap smear 2000   [facility] Biopsy  . Back pain   . Breast fibroadenoma   . Diverticulitis of colon with perforation 05/20/2014  . GERD (gastroesophageal reflux disease)   . H/O metrorrhagia 12/2006  . H/O myomectomy 07-2006   robotic  . H/O sinusitis   . Headache(784.0)   . History of conization of cervix 2000  . History of ectopic pregnancy 2005   r salpyngectomy  . History of hysterectomy, supracervical 08-2008   robotic  . History of syphilis   . History of syphilis   . Hx of herpes simplex type 2 infection   . Hypertension   . PMS (premenstrual syndrome)     Patient Active Problem List   Diagnosis Date Noted  . Ileostomy present ([facility]) 10/26/2014  . Diverticulitis of sigmoid colon s/p colectomy 06/30/2014 07/26/2014  . Intra-abdominal abscess ([place])  07/21/2014  . HTN (hypertension) 11/02/2013  . TOBACCO USER 11/26/2008    Past Surgical History:  Procedure Laterality Date  . ABDOMINAL HYSTERECTOMY    . BREAST LUMPECTOMY  10/11   rt-neg  . BREAST SURGERY  2004   fibroademoma  . CERVICAL CONE BIOPSY  2000  . COLON RESECTION N/A 07/06/2014   Procedure: LAPAROSCOPIC LOOP ILEOSTOMY WITH PLACEMENT OF PELVIC DRAIN;  Surgeon: Donnie Mesa, MD;  Location: [facility];  Service: General;  Laterality: N/A;  . COLON SURGERY    . DILATION AND CURETTAGE OF UTERUS  2009  . DIVERTING ILEOSTOMY Right 06/2014  . ECTOPIC PREGNANCY SURGERY  2005   R salpyngectomy  . ILEOSTOMY N/A 10/26/2014   Procedure: LOOP ILEOSTOMY REVERSAL;  Surgeon: Donnie Mesa, MD;  Location: [facility];  Service: General;  Laterality: N/A;  . LAPAROSCOPIC SIGMOID COLECTOMY N/A 06/30/2014   Procedure: LAPAROSCOPIC ASSISTED SIGMOID COLECTOMY;  Surgeon: Donnie Mesa, MD;  Location: [facility];  Service: General;  Laterality: N/A;  . MYOMECTOMY  2008   robotic  . POLYPECTOMY  2009  . ROBOTIC ASSISTED LAP VAGINAL HYSTERECTOMY  2010   supracervical  . SALPINGECTOMY  2005  . TRIGGER FINGER RELEASE Right 09/28/2012   Procedure: RELEASE TRIGGER FINGER/A-1 PULLEY RIGHT THUMB;  Surgeon: Jolyn Nap, MD;  Location: [facility];  Service: Orthopedics;  Laterality: Right;    OB History    Gravida Para Term Preterm AB Living   4 2 2   2 2    SAB TAB Ectopic Multiple Live  Births   0 1 1 0         Home Medications    Prior to Admission medications   Medication Sig Start Date End Date Taking? Authorizing Provider  Black Cohosh (REMIFEMIN) 20 MG TABS Take 40 mg by mouth daily.   Yes Historical Provider, MD  fluticasone (FLONASE) 50 MCG/ACT nasal spray Place 1 spray into both nostrils daily as needed for allergies.  02/03/15  Yes Historical Provider, MD  hydrocortisone cream 0.5 % Apply 1 application topically 2 (two) times daily as needed for itching.   Yes Historical Provider,  MD  ibuprofen (ADVIL,MOTRIN) 800 MG tablet Take 800 mg by mouth every 8 (eight) hours as needed for moderate pain.   Yes Historical Provider, MD  lisinopril (PRINIVIL,ZESTRIL) 10 MG tablet Take 10 mg by mouth daily.   Yes Historical Provider, MD  Multiple Vitamins-Minerals (HAIR/SKIN/NAILS/BIOTIN PO) Take 1 tablet by mouth daily.   Yes Historical Provider, MD  dicyclomine (BENTYL) 20 MG tablet Take 1 tablet (20 mg total) by mouth 2 (two) times daily. 01/30/16   Nona Dell, PA-C  ondansetron (ZOFRAN ODT) 4 MG disintegrating tablet Take 1 tablet (4 mg total) by mouth every 8 (eight) hours as needed for nausea or vomiting. 01/30/16   Nona Dell, PA-C    Family History Family History  Problem Relation Age of Onset  . Hypertension Paternal Grandmother   . Diabetes Paternal Grandmother   . Hypertension Maternal Grandmother   . Sarcoidosis Mother   . Hypertension Mother   . Thyroid disease Maternal Aunt   . Mental illness Cousin     Social History Social History  Substance Use Topics  . Smoking status: Former Smoker    Packs/day: 0.25    Years: 15.00    Types: Cigarettes    Quit date: 06/12/2011  . Smokeless tobacco: Never Used  . Alcohol use No     Allergies   Keflex [cephalexin] and Penicillins   Review of Systems Review of Systems   Physical Exam Updated Vital Signs BP 120/75   Pulse 82   Temp 98.2 F (36.8 C) (Oral)   Resp 16   Ht 5\' 4"  (1.626 m)   Wt 92.1 kg   SpO2 95%   BMI 34.84 kg/m   Physical Exam  Constitutional: She is oriented to person, place, and time. She appears well-developed and well-nourished. No distress.  HENT:  Head: Normocephalic and atraumatic.  Mouth/Throat: Uvula is midline, oropharynx is clear and moist and mucous membranes are normal. No oropharyngeal exudate, posterior oropharyngeal edema, posterior oropharyngeal erythema or tonsillar abscesses. No tonsillar exudate.  Eyes: Conjunctivae and EOM are normal. Right  eye exhibits no discharge. Left eye exhibits no discharge. No scleral icterus.  Neck: Normal range of motion. Neck supple.  Cardiovascular: Normal rate, regular rhythm, normal heart sounds and intact distal pulses.   Pulmonary/Chest: Effort normal and breath sounds normal. No respiratory distress. She has no wheezes. She has no rales. She exhibits no tenderness.  Abdominal: Soft. Bowel sounds are normal. She exhibits no distension and no mass. There is tenderness (LLQ). There is no rebound and no guarding. No hernia.  No CVA tenderness  Musculoskeletal: Normal range of motion. She exhibits no edema.  Neurological: She is alert and oriented to person, place, and time.  Skin: Skin is warm and dry. She is not diaphoretic.  Nursing note and vitals reviewed.    ED Treatments / Results  Labs (all labs ordered are listed, but only  abnormal results are displayed) Labs Reviewed  COMPREHENSIVE METABOLIC PANEL - Abnormal; Notable for the following:       Result Value   Glucose, Bld 104 (*)    All other components within normal limits  LIPASE, BLOOD  CBC  URINALYSIS, ROUTINE W REFLEX MICROSCOPIC    EKG  EKG Interpretation None       Radiology Ct Abdomen Pelvis W Contrast  Result Date: 01/30/2016 CLINICAL DATA:  45 year old female with left lower quadrant abdominal pain. History of diverticulitis and colon resection. EXAM: CT ABDOMEN AND PELVIS WITH CONTRAST TECHNIQUE: Multidetector CT imaging of the abdomen and pelvis was performed using the standard protocol following bolus administration of intravenous contrast. CONTRAST:  1 ISOVUE-300 IOPAMIDOL (ISOVUE-300) INJECTION 61% COMPARISON:  Abdominal CT dated 02/13/2015 FINDINGS: Lower chest: Minimal bibasilar dependent atelectatic changes. No intra-abdominal free air or free fluid. Hepatobiliary: Apparent mild fatty infiltration of the liver. No intrahepatic biliary ductal dilatation. Focal subcapsular hypodensity in the anterior aspect of the  liver appears stable since the prior CT and likely represents an area of scarring. The gallbladder is unremarkable. Pancreas: Unremarkable. No pancreatic ductal dilatation or surrounding inflammatory changes. Spleen: Normal in size without focal abnormality. Adrenals/Urinary Tract: Stable appearing 8 mm left adrenal nodule most compatible with an adenoma. The right adrenal gland appears unremarkable. The kidneys, visualized ureters, and urinary bladder appear unremarkable. Stomach/Bowel: There is postsurgical changes of partial sigmoid resection with anastomotic sutures. There is scattered sigmoid diverticula without active inflammatory changes. There is also postsurgical changes of the terminal ileum. There is no evidence of bowel obstruction or active inflammation. The appendix contains high attenuating content, likely appendicolith. No inflammatory changes of the appendix identified. Vascular/Lymphatic: Mild aortoiliac atherosclerotic disease. The IVC appears unremarkable. The SMV, splenic vein, and main portal vein are patent. No portal venous gas identified. There is no adenopathy. Reproductive: Hysterectomy. Other: There is diastases of anterior abdominal wall musculature in the midline at the level of the umbilicus. No fluid collection. Musculoskeletal: No acute or significant osseous findings. IMPRESSION: No acute intra-abdominal or pelvic pathology. No evidence of bowel obstruction or active inflammation. Sigmoid diverticulosis. No CT evidence of acute appendicitis. Postsurgical changes of partial sigmoid resection as well as postsurgical changes of the terminal ileum without complication. Electronically Signed   By: Anner Crete M.D.   On: 01/30/2016 05:47    Procedures Procedures (including critical care time)  Medications Ordered in ED Medications  iopamidol (ISOVUE-300) 61 % injection (not administered)  sodium chloride 0.9 % bolus 1,000 mL (0 mLs Intravenous Stopped 01/30/16 0639)    morphine 4 MG/ML injection 4 mg (4 mg Intravenous Given 01/30/16 0250)  ondansetron (ZOFRAN) injection 4 mg (4 mg Intravenous Given 01/30/16 0250)  iopamidol (ISOVUE-300) 61 % injection (100 mLs Intravenous Contrast Given 01/30/16 0526)  morphine 4 MG/ML injection 4 mg (4 mg Intravenous Given 01/30/16 [id number])     Initial Impression / Assessment and Plan / ED Course  I have reviewed the triage vital signs and the nursing notes.  Pertinent labs & imaging results that were available during my care of the patient were reviewed by me and considered in my medical decision making (see chart for details).  Clinical Course     Patient presents with left lower abdominal pain and associated nausea. She reports pain feels similar to episode of diverticulitis she has had in the past. History of GERD, hypertension, diverticulitis resulting in colon resection and ileostomy (reversal surgery performed 10/2014), partial hysterectomy. VSS. Exam revealed tenderness  over left lower quadrant without peritoneal signs. Remaining exam unremarkable. Patient given IV fluids, pain meds and Zofran. Labs and urine unremarkable. Discussed patient with Dr. Dina Rich. Discussed labs with patient. Upon further discussion regarding  CT scan due to surgical hx with pt, pt requesting CT due to worsening abdominal pain. CT scan showed no acute intra-abdominal pathology, no evidence of bowel obstruction, sigmoid diverticulosis noted, no evidence of acute appendicitis. On reevaluation patient reports improvement of symptoms in the ED. Patient able to tolerate PO. Discussed results and plan for discharge with patient. Advised patient to follow up with PCP for reevaluation this week. Discussed return precautions.  Final Clinical Impressions(s) / ED Diagnoses   Final diagnoses:  Left lower quadrant pain    New Prescriptions New Prescriptions   DICYCLOMINE (BENTYL) 20 MG TABLET    Take 1 tablet (20 mg total) by mouth 2 (two) times daily.    ONDANSETRON (ZOFRAN ODT) 4 MG DISINTEGRATING TABLET    Take 1 tablet (4 mg total) by mouth every 8 (eight) hours as needed for nausea or vomiting.     Chesley Noon [place], [place] 01/30/16 [id number]    Merryl Hacker, MD 01/30/16 [phone]

## 2016-01-30 NOTE — Discharge Instructions (Signed)
Take your medications as prescribed as needed for pain relief. I recommend continuing to drink fluids at home to remain hydrated and eating a bland diet for the next 2 days and today her symptoms have improved. Follow-up with your primary care provider this week for reevaluation regarding her abdominal pain. Please return to the Emergency Department if symptoms worsen or new onset of fever, chest pain, difficulty breathing, new/worsening abdominal pain, vomiting, unable to keep fluids down, blood in vomit or stool.

## 2016-08-24 ENCOUNTER — Encounter: Payer: Self-pay | Admitting: Physician Assistant

## 2016-08-24 ENCOUNTER — Ambulatory Visit (INDEPENDENT_AMBULATORY_CARE_PROVIDER_SITE_OTHER): Payer: 59 | Admitting: Physician Assistant

## 2016-08-24 VITALS — BP 132/70 | HR 98 | Temp 99.2°F | Resp 18 | Ht 64.57 in | Wt 208.6 lb

## 2016-08-24 DIAGNOSIS — B351 Tinea unguium: Secondary | ICD-10-CM | POA: Diagnosis not present

## 2016-08-24 MED ORDER — IBUPROFEN 800 MG PO TABS: 800.0000 mg | ORAL_TABLET | Freq: Three times a day (TID) | ORAL | 0 refills | Status: DC | PRN

## 2016-08-24 NOTE — Patient Instructions (Addendum)
     IF you received an x-ray today, you will receive an invoice from [facility] Radiology. Please contact [facility] Radiology at [phone] with questions or concerns regarding your invoice.   IF you received labwork today, you will receive an invoice from [facility]. Please contact [facility] at [phone] with questions or concerns regarding your invoice.   Our billing staff will not be able to assist you with questions regarding bills from these companies.  You will be contacted with the lab results as soon as they are available. The fastest way to get your results is to activate your My Chart account. Instructions are located on the last page of this paperwork. If you have not heard from [place] regarding the results in 2 weeks, please contact this office.     INGROWN TOENAIL . Keep area clean, dry and bandaged for 24 hours. . After 24 hours, remove outer bandage and leave yellow gauze in place. Domingo Madeira toe/foot in warm soapy water for 5-10 minutes, once daily for 5 days. Rebandage toe after each cleaning. . Continue soaks until yellow gauze falls off. . Notify the office if you experience any of the following signs of infection: Swelling, redness, pus drainage, streaking, fever > 101.0 F

## 2016-08-24 NOTE — Progress Notes (Signed)
   [name]  MRN: [id number] DOB: August 23, 1970  PCP: Lottie Dawson, MD  Chief Complaint  Patient presents with  . Toe Injury    right big toe     Subjective:  Pt presents to clinic for right toenail removal - she has toenail fungus and she really does not want to use oral medications and would like to have it removed.    Review of Systems  Constitutional: Negative for chills and fever.    Patient Active Problem List   Diagnosis Date Noted  . Ileostomy present ([place]) 10/26/2014  . Diverticulitis of sigmoid colon s/p colectomy 06/30/2014 07/26/2014  . Intra-abdominal abscess ([place]) 07/21/2014  . HTN (hypertension) 11/02/2013  . TOBACCO USER 11/26/2008    Current Outpatient Prescriptions on File Prior to Visit  Medication Sig Dispense Refill  . Black Cohosh (REMIFEMIN) 20 MG TABS Take 40 mg by mouth daily.    . hydrocortisone cream 0.5 % Apply 1 application topically 2 (two) times daily as needed for itching.    Marland Kitchen ibuprofen (ADVIL,MOTRIN) 800 MG tablet Take 800 mg by mouth every 8 (eight) hours as needed for moderate pain.    Marland Kitchen lisinopril (PRINIVIL,ZESTRIL) 10 MG tablet Take 10 mg by mouth daily.    . Multiple Vitamins-Minerals (HAIR/SKIN/NAILS/BIOTIN PO) Take 1 tablet by mouth daily.    Marland Kitchen dicyclomine (BENTYL) 20 MG tablet Take 1 tablet (20 mg total) by mouth 2 (two) times daily. (Patient not taking: Reported on 08/24/2016) 20 tablet 0  . fluticasone (FLONASE) 50 MCG/ACT nasal spray Place 1 spray into both nostrils daily as needed for allergies.   0  . ondansetron (ZOFRAN ODT) 4 MG disintegrating tablet Take 1 tablet (4 mg total) by mouth every 8 (eight) hours as needed for nausea or vomiting. (Patient not taking: Reported on 08/24/2016) 10 tablet 0   No current facility-administered medications on file prior to visit.     Allergies  Allergen Reactions  . Keflex [Cephalexin] Anaphylaxis  . Penicillins Anaphylaxis    Pt patients past, family and social  history were reviewed and updated.   Objective:  BP (!) 140/95   Pulse 98   Temp 99.2 F (37.3 C) (Oral)   Resp 18   Ht 5' 4.57" (1.64 m)   Wt 208 lb 9.6 oz (94.6 kg)   SpO2 97%   BMI 35.18 kg/m   Physical Exam  Constitutional: She is oriented to person, place, and time and well-developed, well-nourished, and in no distress.  HENT:  Head: Normocephalic and atraumatic.  Right Ear: Hearing and external ear normal.  Left Ear: Hearing and external ear normal.  Eyes: Conjunctivae are normal.  Neck: Normal range of motion.  Pulmonary/Chest: Effort normal.  Neurological: She is alert and oriented to person, place, and time. Gait normal.  Skin: Skin is warm and dry.  Right great toenail onychomycosis appearance - soft nail that does not extend to the end of the nail bed.  Psychiatric: Mood, memory, affect and judgment normal.  Vitals reviewed.  Procedure:  Consent obtained.  Digital block with lidocaine and marcaine 50:50.  Betadine prep - nail removed and xeroform guaze placed on the nailbed and then dressing placed. Assessment and Plan :  Onychomycosis  Wound care d/w pt.  Motrin for pain - rest and elevation.  Windell Hummingbird PA-C  [facility] 08/24/2016 9:16 AM

## 2016-08-26 MED FILL — ATORVASTATIN 20 MG TABLET: 20 | 30 days supply | Qty: 30 | Fill #0

## 2016-08-26 MED FILL — LISINOPRIL 10 MG TABLET: 10 | 90 days supply | Qty: 90 | Fill #0

## 2016-08-27 ENCOUNTER — Telehealth: Payer: Self-pay | Admitting: *Deleted

## 2016-08-27 ENCOUNTER — Other Ambulatory Visit: Payer: Self-pay | Admitting: Physician Assistant

## 2016-08-27 MED ORDER — TRAMADOL HCL 50 MG PO TABS: 50.0000 mg | ORAL_TABLET | Freq: Three times a day (TID) | ORAL | 0 refills | Status: DC | PRN

## 2016-08-27 MED FILL — traMADol HCL 50 MG TABS: 50 | 5 days supply | Qty: 15 | Fill #0

## 2016-08-27 NOTE — Progress Notes (Signed)
Patient reports pain of toe following toenail removal.  She has tried motrin 800mg  to no avail.  Contacted sarah weber, her visit provider, and she would like to issue tramadol.  I am filling in her absence.

## 2016-08-27 NOTE — Telephone Encounter (Signed)
Faxed Tramadol 50 mg to [facility] per patient . Confirmation page received at 11:58 am.

## 2016-09-03 ENCOUNTER — Encounter: Payer: Self-pay | Admitting: Physician Assistant

## 2016-09-05 NOTE — Telephone Encounter (Signed)
Printed letter please put in locker of patient

## 2016-09-16 DIAGNOSIS — R32 Unspecified urinary incontinence: Secondary | ICD-10-CM | POA: Diagnosis not present

## 2016-09-16 DIAGNOSIS — K5792 Diverticulitis of intestine, part unspecified, without perforation or abscess without bleeding: Secondary | ICD-10-CM | POA: Diagnosis not present

## 2016-09-16 DIAGNOSIS — B372 Candidiasis of skin and nail: Secondary | ICD-10-CM | POA: Diagnosis not present

## 2016-09-16 DIAGNOSIS — Z01419 Encounter for gynecological examination (general) (routine) without abnormal findings: Secondary | ICD-10-CM | POA: Diagnosis not present

## 2016-09-16 DIAGNOSIS — Z1231 Encounter for screening mammogram for malignant neoplasm of breast: Secondary | ICD-10-CM | POA: Diagnosis not present

## 2016-09-16 DIAGNOSIS — B009 Herpesviral infection, unspecified: Secondary | ICD-10-CM | POA: Diagnosis not present

## 2016-09-16 DIAGNOSIS — Z124 Encounter for screening for malignant neoplasm of cervix: Secondary | ICD-10-CM | POA: Diagnosis not present

## 2016-10-04 NOTE — Telephone Encounter (Signed)
Entered in error

## 2016-10-07 MED FILL — ATORVASTATIN 20 MG TABLET: 20 | 30 days supply | Qty: 30 | Fill #1

## 2016-10-14 MED FILL — ACETAMINOPHEN/COD #3 TABLET: 300-30 | 4 days supply | Qty: 16 | Fill #0

## 2016-10-14 MED FILL — CLINDAMYCIN HCL 150 MG CAPS: 150 | 7 days supply | Qty: 21 | Fill #0

## 2016-10-22 ENCOUNTER — Encounter (HOSPITAL_COMMUNITY): Payer: Self-pay | Admitting: Emergency Medicine

## 2016-10-22 ENCOUNTER — Ambulatory Visit (HOSPITAL_COMMUNITY)
Admission: EM | Admit: 2016-10-22 | Discharge: 2016-10-22 | Disposition: A | Discharge status: Home / Self Care | Payer: 59 | Attending: Family Medicine | Admitting: Family Medicine

## 2016-10-22 DIAGNOSIS — T23161A Burn of first degree of back of right hand, initial encounter: Secondary | ICD-10-CM

## 2016-10-22 DIAGNOSIS — T23031A Burn of unspecified degree of multiple right fingers (nail), not including thumb, initial encounter: Secondary | ICD-10-CM | POA: Diagnosis not present

## 2016-10-22 DIAGNOSIS — X102XXA Contact with fats and cooking oils, initial encounter: Secondary | ICD-10-CM | POA: Diagnosis not present

## 2016-10-22 MED ORDER — HYDROCODONE-ACETAMINOPHEN 5-325 MG PO TABS: 1.0000 | ORAL_TABLET | Freq: Four times a day (QID) | ORAL | 0 refills | Status: DC | PRN

## 2016-10-22 NOTE — ED Triage Notes (Signed)
PT burned right fingers with grease while cooking tonight at 6pm

## 2016-10-23 NOTE — ED Provider Notes (Signed)
[facility]   [id number] 10/22/16 Arrival Time: 1900  ASSESSMENT & PLAN:  1. Superficial burn of back of right hand, initial encounter     Meds ordered this encounter  Medications  . HYDROcodone-acetaminophen (NORCO/VICODIN) 5-325 MG tablet    Sig: Take 1 tablet by mouth every 6 (six) hours as needed for moderate pain or severe pain.    Dispense:  10 tablet    Refill:  0   [place] Controlled Substances Registry consulted for this patient. I feel the risk/benefit ratio today is favorable for proceeding with this prescription for a controlled substance.  OTC ibuprofen TID with food. Will f/u in 24-48 hours if not improving. No open wounds.  Reviewed expectations re: course of current medical issues. Questions answered. Outlined signs and symptoms indicating need for more acute intervention. Patient verbalized understanding. After Visit Summary given.   SUBJECTIVE:  [name] is a 46 y.o. female who presents with complaint of R hand and fingers burn. Grease splashed onto hand a few hours ago. Immediately washed. Now painful. No blistering. No finger ROM loss or sensation changes reported. No OTC analgesics taken. Td UTD.  ROS: As per HPI.   OBJECTIVE:  Vitals:   10/22/16 1950  BP: (!) 147/81  Pulse: 85  Resp: 16  Temp: 98.2 F (36.8 C)  TempSrc: Oral  SpO2: 97%  Weight: 210 lb (95.3 kg)  Height: 5\' 4"  (1.626 m)    General appearance: alert; no distress Back: no CVA tenderness Extremities: no cyanosis or edema; symmetrical with no gross deformities Skin: erythema over back of R hand and over 2/3/4 fingers; no blistering; FROM at wrist and in all fingers; normal capillary refill; normal sensation; tender to touch Psychological: alert and cooperative; normal mood and affect  Allergies  Allergen Reactions  . Keflex [Cephalexin] Anaphylaxis  . Penicillins Anaphylaxis    Past Medical History:  Diagnosis Date  . Abnormal Pap smear 2000   [facility] Biopsy    . Allergy   . Back pain   . Breast fibroadenoma   . Diverticulitis of colon with perforation 05/20/2014  . GERD (gastroesophageal reflux disease)   . H/O metrorrhagia 12/2006  . H/O myomectomy 07-2006   robotic  . H/O sinusitis   . Headache(784.0)   . History of conization of cervix 2000  . History of ectopic pregnancy 2005   r salpyngectomy  . History of hysterectomy, supracervical 08-2008   robotic  . History of syphilis   . History of syphilis   . Hx of herpes simplex type 2 infection   . Hypertension   . PMS (premenstrual syndrome)    Social History   Social History  . Marital status: Married    Spouse name: N/A  . Number of children: N/A  . Years of education: N/A   Occupational History  . Not on file.   Social History Main Topics  . Smoking status: Former Smoker    Packs/day: 0.25    Years: 15.00    Types: Cigarettes    Quit date: 06/12/2011  . Smokeless tobacco: Never Used  . Alcohol use No  . Drug use: No  . Sexual activity: Yes   Other Topics Concern  . Not on file   Social History Narrative  . No narrative on file   Family History  Problem Relation Age of Onset  . Hypertension Paternal Grandmother   . Diabetes Paternal Grandmother   . Hypertension Maternal Grandmother   . Sarcoidosis Mother   .  Hypertension Mother   . Thyroid disease Maternal Aunt   . Mental illness Cousin    Past Surgical History:  Procedure Laterality Date  . ABDOMINAL HYSTERECTOMY    . BREAST LUMPECTOMY  10/11   rt-neg  . BREAST SURGERY  2004   fibroademoma  . CERVICAL CONE BIOPSY  2000  . COLON RESECTION N/A 07/06/2014   Procedure: LAPAROSCOPIC LOOP ILEOSTOMY WITH PLACEMENT OF PELVIC DRAIN;  Surgeon: Donnie Mesa, MD;  Location: [facility];  Service: General;  Laterality: N/A;  . COLON SURGERY    . DILATION AND CURETTAGE OF UTERUS  2009  . DIVERTING ILEOSTOMY Right 06/2014  . ECTOPIC PREGNANCY SURGERY  2005   R salpyngectomy  . ILEOSTOMY N/A 10/26/2014   Procedure: LOOP  ILEOSTOMY REVERSAL;  Surgeon: Donnie Mesa, MD;  Location: [facility];  Service: General;  Laterality: N/A;  . LAPAROSCOPIC SIGMOID COLECTOMY N/A 06/30/2014   Procedure: LAPAROSCOPIC ASSISTED SIGMOID COLECTOMY;  Surgeon: Donnie Mesa, MD;  Location: [facility];  Service: General;  Laterality: N/A;  . MYOMECTOMY  2008   robotic  . POLYPECTOMY  2009  . ROBOTIC ASSISTED LAP VAGINAL HYSTERECTOMY  2010   supracervical  . SALPINGECTOMY  2005  . TRIGGER FINGER RELEASE Right 09/28/2012   Procedure: RELEASE TRIGGER FINGER/A-1 PULLEY RIGHT THUMB;  Surgeon: Jolyn Nap, MD;  Location: [facility];  Service: Orthopedics;  Laterality: Right;     Vanessa Kick, MD 10/23/16 1020

## 2016-10-25 ENCOUNTER — Other Ambulatory Visit: Payer: Self-pay | Admitting: Internal Medicine

## 2016-10-25 ENCOUNTER — Ambulatory Visit
Admission: RE | Admit: 2016-10-25 | Discharge: 2016-10-25 | Disposition: A | Discharge status: Home / Self Care | Payer: 59 | Source: Ambulatory Visit | Attending: Internal Medicine | Admitting: Internal Medicine

## 2016-10-25 DIAGNOSIS — G8929 Other chronic pain: Secondary | ICD-10-CM | POA: Diagnosis not present

## 2016-10-25 DIAGNOSIS — E785 Hyperlipidemia, unspecified: Secondary | ICD-10-CM | POA: Diagnosis not present

## 2016-10-25 DIAGNOSIS — M25511 Pain in right shoulder: Secondary | ICD-10-CM

## 2016-10-25 DIAGNOSIS — I10 Essential (primary) hypertension: Secondary | ICD-10-CM | POA: Diagnosis not present

## 2016-10-25 DIAGNOSIS — M542 Cervicalgia: Secondary | ICD-10-CM | POA: Diagnosis not present

## 2016-10-31 MED FILL — ATORVASTATIN 20 MG TABLET: 20 | 90 days supply | Qty: 90 | Fill #0

## 2016-11-26 MED FILL — LISINOPRIL 10 MG TABS: 10 | 90 days supply | Qty: 90 | Fill #1

## 2016-12-04 MED FILL — ACETAMINOPHEN/COD #3 TABLET: 300-30 | 4 days supply | Qty: 16 | Fill #0

## 2017-01-17 DIAGNOSIS — M542 Cervicalgia: Secondary | ICD-10-CM | POA: Diagnosis not present

## 2017-01-17 DIAGNOSIS — M7581 Other shoulder lesions, right shoulder: Secondary | ICD-10-CM | POA: Diagnosis not present

## 2017-01-23 ENCOUNTER — Encounter (HOSPITAL_COMMUNITY): Payer: Self-pay

## 2017-01-23 ENCOUNTER — Other Ambulatory Visit: Payer: Self-pay

## 2017-01-23 DIAGNOSIS — I10 Essential (primary) hypertension: Secondary | ICD-10-CM | POA: Diagnosis not present

## 2017-01-23 DIAGNOSIS — Z87891 Personal history of nicotine dependence: Secondary | ICD-10-CM | POA: Insufficient documentation

## 2017-01-23 DIAGNOSIS — R1032 Left lower quadrant pain: Secondary | ICD-10-CM | POA: Insufficient documentation

## 2017-01-23 DIAGNOSIS — R1030 Lower abdominal pain, unspecified: Secondary | ICD-10-CM | POA: Diagnosis present

## 2017-01-23 DIAGNOSIS — R197 Diarrhea, unspecified: Secondary | ICD-10-CM | POA: Diagnosis not present

## 2017-01-23 DIAGNOSIS — Z79899 Other long term (current) drug therapy: Secondary | ICD-10-CM | POA: Diagnosis not present

## 2017-01-23 LAB — CBC
HCT: 40.6 % (ref 36.0–46.0)
Hemoglobin: 13.6 g/dL (ref 12.0–15.0)
MCH: 30.2 pg (ref 26.0–34.0)
MCHC: 33.5 g/dL (ref 30.0–36.0)
MCV: 90 fL (ref 78.0–100.0)
Platelets: 351 K/uL (ref 150–400)
RBC: 4.51 MIL/uL (ref 3.87–5.11)
RDW: 13 % (ref 11.5–15.5)
WBC: 10.1 K/uL (ref 4.0–10.5)

## 2017-01-23 LAB — COMPREHENSIVE METABOLIC PANEL WITH GFR
ALT: 26 U/L (ref 14–54)
AST: 29 U/L (ref 15–41)
Albumin: 4.4 g/dL (ref 3.5–5.0)
Alkaline Phosphatase: 63 U/L (ref 38–126)
Anion gap: 10 (ref 5–15)
BUN: 11 mg/dL (ref 6–20)
CO2: 24 mmol/L (ref 22–32)
Calcium: 9.8 mg/dL (ref 8.9–10.3)
Chloride: 100 mmol/L — ABNORMAL LOW (ref 101–111)
Creatinine, Ser: 0.8 mg/dL (ref 0.44–1.00)
GFR calc Af Amer: >60 mL/min (ref >60)
GFR calc non Af Amer: >60 mL/min (ref >60)
Glucose, Bld: 102 mg/dL — ABNORMAL HIGH (ref 65–99)
Potassium: 4 mmol/L (ref 3.5–5.1)
Sodium: 134 mmol/L — ABNORMAL LOW (ref 135–145)
Total Bilirubin: 0.8 mg/dL (ref 0.3–1.2)
Total Protein: 7.4 g/dL (ref 6.5–8.1)

## 2017-01-23 LAB — URINALYSIS, ROUTINE W REFLEX MICROSCOPIC
Bilirubin Urine: NEGATIVE
Glucose, UA: NEGATIVE mg/dL
Hgb urine dipstick: NEGATIVE
Ketones, ur: NEGATIVE mg/dL
Leukocytes, UA: NEGATIVE
Nitrite: NEGATIVE
Protein, ur: NEGATIVE mg/dL
Specific Gravity, Urine: 1.025 (ref 1.005–1.030)
pH: 5 (ref 5.0–8.0)

## 2017-01-23 LAB — LIPASE, BLOOD: Lipase: 45 U/L (ref 11–51)

## 2017-01-23 NOTE — ED Triage Notes (Signed)
Pt endorses left sided abd pain with loose stools x 2 days. Pt has hx of diverticulitis and states this feels the same. VSS

## 2017-01-24 ENCOUNTER — Emergency Department (HOSPITAL_COMMUNITY)
Admission: EM | Admit: 2017-01-24 | Discharge: 2017-01-24 | Disposition: A | Discharge status: Home / Self Care | Payer: 59 | Attending: Emergency Medicine | Admitting: Emergency Medicine

## 2017-01-24 ENCOUNTER — Emergency Department (HOSPITAL_COMMUNITY): Payer: 59

## 2017-01-24 DIAGNOSIS — I10 Essential (primary) hypertension: Secondary | ICD-10-CM | POA: Diagnosis not present

## 2017-01-24 DIAGNOSIS — R197 Diarrhea, unspecified: Secondary | ICD-10-CM | POA: Diagnosis not present

## 2017-01-24 DIAGNOSIS — R109 Unspecified abdominal pain: Secondary | ICD-10-CM | POA: Diagnosis not present

## 2017-01-24 DIAGNOSIS — Z87891 Personal history of nicotine dependence: Secondary | ICD-10-CM | POA: Diagnosis not present

## 2017-01-24 DIAGNOSIS — R1032 Left lower quadrant pain: Secondary | ICD-10-CM

## 2017-01-24 DIAGNOSIS — Z79899 Other long term (current) drug therapy: Secondary | ICD-10-CM | POA: Diagnosis not present

## 2017-01-24 LAB — I-STAT CG4 LACTIC ACID, ED: LACTIC ACID, VENOUS: 0.53 mmol/L (ref 0.5–1.9)

## 2017-01-24 MED ORDER — ONDANSETRON HCL 4 MG/2ML IJ SOLN
4.0000 mg | Freq: Once | INTRAMUSCULAR | Status: AC
  Administered 2017-01-24: 4 mg via INTRAVENOUS
  Filled 2017-01-24: qty 2

## 2017-01-24 MED ORDER — SODIUM CHLORIDE 0.9 % IV SOLN
INTRAVENOUS | Status: DC
  Administered 2017-01-24: 03:00:00 via INTRAVENOUS

## 2017-01-24 MED ORDER — DICYCLOMINE HCL 20 MG PO TABS: 20.0000 mg | ORAL_TABLET | Freq: Two times a day (BID) | ORAL | 0 refills | Status: DC

## 2017-01-24 MED ORDER — ONDANSETRON 4 MG PO TBDP: 4.0000 mg | ORAL_TABLET | Freq: Three times a day (TID) | ORAL | 0 refills | Status: DC | PRN

## 2017-01-24 MED ORDER — FENTANYL CITRATE (PF) 100 MCG/2ML IJ SOLN
50.0000 ug | Freq: Once | INTRAMUSCULAR | Status: AC
  Administered 2017-01-24: 50 ug via INTRAVENOUS
  Filled 2017-01-24: qty 2

## 2017-01-24 MED ORDER — IOPAMIDOL (ISOVUE-300) INJECTION 61%
INTRAVENOUS | Status: AC
  Administered 2017-01-24: 100 mL
  Filled 2017-01-24: qty 100

## 2017-01-24 NOTE — ED Notes (Signed)
Patient transported to CT 

## 2017-01-24 NOTE — Discharge Instructions (Signed)
There is no evidence of diverticulitis.  Take the nausea medication and cramping medication as prescribed.  Follow-up with your doctor.  Return to the ED if you develop new or worsening symptoms.

## 2017-01-24 NOTE — ED Notes (Signed)
Pt ready for CT

## 2017-01-24 NOTE — ED Provider Notes (Signed)
[facility] EMERGENCY DEPARTMENT Provider Note   CSN: [id number] Arrival date & time: 01/23/17  2155     History   Chief Complaint Chief Complaint  Patient presents with  . Abdominal Pain    HPI [name] is a 46 y.o. female.  Patient with history of diverticulitis status post colectomy and ileostomy with reversal presenting with crampy left-sided lower abdominal pain for the past 3 days.  Reports the pain comes and goes and lasts for a few seconds at a time.  It reminds her of her previous diverticulitis pain.  She has not had any issues since her surgery in 2016.  Denies any fever, chills, nausea or vomiting.  No pain with urination or blood in the urine.  No vaginal bleeding or discharge.  She is not having the pain currently.  She reports 4 loose stools yesterday but none today.  Has had a few formed bowel movements as well.  Denies any blood in the stool.  Denies any chest pain or shortness of breath.   The history is provided by the patient and a relative.  Abdominal Pain   Associated symptoms include diarrhea. Pertinent negatives include fever, nausea, vomiting, dysuria, hematuria, headaches, arthralgias and myalgias.    Past Medical History:  Diagnosis Date  . Abnormal Pap smear 2000   [facility] Biopsy  . Allergy   . Back pain   . Breast fibroadenoma   . Diverticulitis of colon with perforation 05/20/2014  . GERD (gastroesophageal reflux disease)   . H/O metrorrhagia 12/2006  . H/O myomectomy 07-2006   robotic  . H/O sinusitis   . Headache(784.0)   . History of conization of cervix 2000  . History of ectopic pregnancy 2005   r salpyngectomy  . History of hysterectomy, supracervical 08-2008   robotic  . History of syphilis   . History of syphilis   . Hx of herpes simplex type 2 infection   . Hypertension   . PMS (premenstrual syndrome)     Patient Active Problem List   Diagnosis Date Noted  . Ileostomy present ([place]) 10/26/2014  .  Diverticulitis of sigmoid colon s/p colectomy 06/30/2014 07/26/2014  . Intra-abdominal abscess ([place]) 07/21/2014  . HTN (hypertension) 11/02/2013  . TOBACCO USER 11/26/2008    Past Surgical History:  Procedure Laterality Date  . ABDOMINAL HYSTERECTOMY    . BREAST LUMPECTOMY  10/11   rt-neg  . BREAST SURGERY  2004   fibroademoma  . CERVICAL CONE BIOPSY  2000  . COLON RESECTION N/A 07/06/2014   Procedure: LAPAROSCOPIC LOOP ILEOSTOMY WITH PLACEMENT OF PELVIC DRAIN;  Surgeon: Donnie Mesa, MD;  Location: [facility];  Service: General;  Laterality: N/A;  . COLON SURGERY    . DILATION AND CURETTAGE OF UTERUS  2009  . DIVERTING ILEOSTOMY Right 06/2014  . ECTOPIC PREGNANCY SURGERY  2005   R salpyngectomy  . ILEOSTOMY N/A 10/26/2014   Procedure: LOOP ILEOSTOMY REVERSAL;  Surgeon: Donnie Mesa, MD;  Location: [facility];  Service: General;  Laterality: N/A;  . LAPAROSCOPIC SIGMOID COLECTOMY N/A 06/30/2014   Procedure: LAPAROSCOPIC ASSISTED SIGMOID COLECTOMY;  Surgeon: Donnie Mesa, MD;  Location: [facility];  Service: General;  Laterality: N/A;  . MYOMECTOMY  2008   robotic  . POLYPECTOMY  2009  . ROBOTIC ASSISTED LAP VAGINAL HYSTERECTOMY  2010   supracervical  . SALPINGECTOMY  2005  . TRIGGER FINGER RELEASE Right 09/28/2012   Procedure: RELEASE TRIGGER FINGER/A-1 PULLEY RIGHT THUMB;  Surgeon: Jolyn Nap, MD;  Location: [facility];  Service: Orthopedics;  Laterality: Right;    OB History    Gravida Para Term Preterm AB Living   4 2 2   2 2    SAB TAB Ectopic Multiple Live Births   0 1 1 0         Home Medications    Prior to Admission medications   Medication Sig Start Date End Date Taking? Authorizing Provider  atorvastatin (LIPITOR) 20 MG tablet Take 20 mg by mouth daily. 07/26/16   [provider]  Black Cohosh (REMIFEMIN) 20 MG TABS Take 40 mg by mouth daily.    [provider]  HYDROcodone-acetaminophen (NORCO/VICODIN) 5-325 MG tablet Take 1 tablet by  mouth every 6 (six) hours as needed for moderate pain or severe pain. 10/22/16   Vanessa Kick, MD  hydrocortisone cream 0.5 % Apply 1 application topically 2 (two) times daily as needed for itching.    [provider]  ibuprofen (ADVIL,MOTRIN) 800 MG tablet Take 1 tablet (800 mg total) by mouth every 8 (eight) hours as needed for moderate pain. 08/24/16   Weber, Damaris Hippo, PA-C  lisinopril (PRINIVIL,ZESTRIL) 10 MG tablet Take 10 mg by mouth daily.    [provider]  Multiple Vitamins-Minerals (HAIR/SKIN/NAILS/BIOTIN PO) Take 1 tablet by mouth daily.    [provider]  traMADol (ULTRAM) 50 MG tablet Take 1 tablet (50 mg total) by mouth every 8 (eight) hours as needed. 08/27/16   Joretta Bachelor, PA    Family History Family History  Problem Relation Age of Onset  . Hypertension Paternal Grandmother   . Diabetes Paternal Grandmother   . Hypertension Maternal Grandmother   . Sarcoidosis Mother   . Hypertension Mother   . Thyroid disease Maternal Aunt   . Mental illness Cousin     Social History Social History   Tobacco Use  . Smoking status: Former Smoker    Packs/day: 0.25    Years: 15.00    Pack years: 3.75    Types: Cigarettes    Last attempt to quit: 06/12/2011    Years since quitting: 5.6  . Smokeless tobacco: Never Used  Substance Use Topics  . Alcohol use: No    Alcohol/week: 1.5 oz    Types: 3 Standard drinks or equivalent per week  . Drug use: No     Allergies   Keflex [cephalexin] and Penicillins   Review of Systems Review of Systems  Constitutional: Negative for activity change, appetite change and fever.  HENT: Negative for congestion.   Respiratory: Negative for cough, chest tightness and shortness of breath.   Cardiovascular: Negative for chest pain.  Gastrointestinal: Positive for abdominal pain and diarrhea. Negative for nausea and vomiting.  Genitourinary: Negative for dysuria, hematuria, vaginal bleeding and vaginal  discharge.  Musculoskeletal: Positive for back pain. Negative for arthralgias and myalgias.  Skin: Negative for wound.  Neurological: Negative for dizziness, weakness and headaches.    all other systems are negative except as noted in the HPI and PMH. \   Physical Exam Updated Vital Signs BP (!) 116/93   Pulse 84   Temp 98.2 F (36.8 C) (Oral)   Resp 16   Ht 5\' 4"  (1.626 m)   Wt 97.1 kg (214 lb)   SpO2 98%   BMI 36.73 kg/m   Physical Exam  Constitutional: She is oriented to person, place, and time. She appears well-developed and well-nourished. No distress.  HENT:  Head: Normocephalic and atraumatic.  Mouth/Throat:  Oropharynx is clear and moist. No oropharyngeal exudate.  Eyes: Conjunctivae and EOM are normal. Pupils are equal, round, and reactive to light.  Neck: Normal range of motion. Neck supple.  No meningismus.  Cardiovascular: Normal rate, regular rhythm, normal heart sounds and intact distal pulses.  No murmur heard. Pulmonary/Chest: Effort normal and breath sounds normal. No respiratory distress.  Abdominal: Soft. There is tenderness. There is no rebound and no guarding.  Tenderness to palpation left lower quadrant and periumbilical.  No guarding or rebound.  Musculoskeletal: Normal range of motion. She exhibits no edema or tenderness.  No CVA tenderness  Neurological: She is alert and oriented to person, place, and time. No cranial nerve deficit. She exhibits normal muscle tone. Coordination normal.   5/5 strength throughout. CN 2-12 intact.Equal grip strength.   Skin: Skin is warm.  Psychiatric: She has a normal mood and affect. Her behavior is normal.  Nursing note and vitals reviewed.    ED Treatments / Results  Labs (all labs ordered are listed, but only abnormal results are displayed) Labs Reviewed  COMPREHENSIVE METABOLIC PANEL - Abnormal; Notable for the following components:      Result Value   Sodium 134 (*)    Chloride 100 (*)    Glucose, Bld  102 (*)    All other components within normal limits  LIPASE, BLOOD  CBC  URINALYSIS, ROUTINE W REFLEX MICROSCOPIC  I-STAT CG4 LACTIC ACID, ED  I-STAT CG4 LACTIC ACID, ED    EKG  EKG Interpretation None       Radiology Ct Abdomen Pelvis W Contrast  Result Date: 01/24/2017 CLINICAL DATA:  46 year old female with abdominal pain. Concern for diverticulitis. EXAM: CT ABDOMEN AND PELVIS WITH CONTRAST TECHNIQUE: Multidetector CT imaging of the abdomen and pelvis was performed using the standard protocol following bolus administration of intravenous contrast. CONTRAST:  110mL ISOVUE-300 IOPAMIDOL (ISOVUE-300) INJECTION 61% COMPARISON:  Abdominal CT dated 01/30/2016 FINDINGS: Lower chest: Bibasilar dependent atelectatic changes. No intra-abdominal free air or free fluid. Hepatobiliary: No focal liver abnormality is seen. No gallstones, gallbladder wall thickening, or biliary dilatation. Pancreas: Unremarkable. No pancreatic ductal dilatation or surrounding inflammatory changes. Spleen: Normal in size without focal abnormality. Adrenals/Urinary Tract: Adrenal glands are unremarkable. Kidneys are normal, without renal calculi, focal lesion, or hydronephrosis. Bladder is unremarkable. Stomach/Bowel: There is postsurgical changes of partial sigmoid resection with anastomotic suture. Surgical changes of the distal ileum. There is no bowel obstruction or active inflammation. Small scattered sigmoid diverticula noted. No active inflammation. The appendix is normal. Vascular/Lymphatic: Mild aortoiliac atherosclerotic disease. The abdominal aorta and IVC are otherwise unremarkable. No portal venous gas. There is no adenopathy. Reproductive: Hysterectomy. Other: There is diastases of anterior abdominal wall musculature at the level of the umbilicus. Musculoskeletal: No acute or significant osseous findings. IMPRESSION: 1. No acute intra-abdominal or pelvic pathology. Small scattered sigmoid diverticula and  postsurgical changes of partial sigmoid resection. No bowel obstruction or active inflammation. Normal appendix. 2.  Aortic Atherosclerosis (ICD10-I70.0). Electronically Signed   By: Anner Crete M.D.   On: 01/24/2017 05:04    Procedures Procedures (including critical care time)  Medications Ordered in ED Medications  fentaNYL (SUBLIMAZE) injection 50 mcg (not administered)  ondansetron (ZOFRAN) injection 4 mg (not administered)  0.9 %  sodium chloride infusion (not administered)     Initial Impression / Assessment and Plan / ED Course  I have reviewed the triage vital signs and the nursing notes.  Pertinent labs & imaging results that were available during  my care of the patient were reviewed by me and considered in my medical decision making (see chart for details).    Patient with left-sided abdominal pain and diarrhea similar to previous episodes of diverticulitis.  Status post colectomy with ileostomy reversal in 2016.  No fevers or vomiting.  Patient given IV fluids and symptom control.  Labs are reassuring with normal lactate.   CT scan obtained given patient's complicated history.  This is negative for acute diverticulitis or obstruction.  Patient tolerating p.o.  Her abdomen is soft without peritoneal signs.  Her CT scan is reassuring.  Discussed appropriate follow-up with her PCP.  Return precautions discussed. Final Clinical Impressions(s) / ED Diagnoses   Final diagnoses:  Left lower quadrant pain    ED Discharge Orders    None       Laurie Penado, Annie Main, MD 01/24/17 [phone]

## 2017-01-24 NOTE — ED Notes (Signed)
Patient denies pain and is resting comfortably.  

## 2017-02-05 ENCOUNTER — Other Ambulatory Visit: Payer: Self-pay

## 2017-02-05 ENCOUNTER — Ambulatory Visit (INDEPENDENT_AMBULATORY_CARE_PROVIDER_SITE_OTHER): Payer: 59 | Admitting: Family Medicine

## 2017-02-05 ENCOUNTER — Encounter: Payer: Self-pay | Admitting: Family Medicine

## 2017-02-05 VITALS — BP 140/95 | HR 108 | Temp 98.0°F | Resp 16 | Wt 216.0 lb

## 2017-02-05 DIAGNOSIS — S61214A Laceration without foreign body of right ring finger without damage to nail, initial encounter: Secondary | ICD-10-CM | POA: Diagnosis not present

## 2017-02-05 DIAGNOSIS — M79644 Pain in right finger(s): Secondary | ICD-10-CM | POA: Diagnosis not present

## 2017-02-05 NOTE — Progress Notes (Signed)
Chief Complaint  Patient presents with  . Laceration    pinky right hand washing dishes    HPI Right handed female Pt cut her right little finger with a class that broke while she was washing dishes She states that the cut was bleeding but she applied pressure She still has some pain  No numbness or tingling  Past Medical History:  Diagnosis Date  . Abnormal Pap smear 2000   [facility]  . Allergy   . Back pain   . Breast fibroadenoma   . Diverticulitis of colon with perforation 05/20/2014  . GERD (gastroesophageal reflux disease)   . H/O metrorrhagia 12/2006  . H/O myomectomy 07-2006   robotic  . H/O sinusitis   . Headache(784.0)   . History of conization of cervix 2000  . History of ectopic pregnancy 2005   r salpyngectomy  . History of hysterectomy, supracervical 08-2008   robotic  . History of syphilis   . History of syphilis   . Hx of herpes simplex type 2 infection   . Hypertension   . PMS (premenstrual syndrome)     Current Outpatient Medications  Medication Sig Dispense Refill  . atorvastatin (LIPITOR) 20 MG tablet Take 20 mg by mouth daily.  0  . Black Cohosh (REMIFEMIN) 20 MG TABS Take 40 mg by mouth daily.    Marland Kitchen dicyclomine (BENTYL) 20 MG tablet Take 1 tablet (20 mg total) by mouth 2 (two) times daily. 20 tablet 0  . HYDROcodone-acetaminophen (NORCO/VICODIN) 5-325 MG tablet Take 1 tablet by mouth every 6 (six) hours as needed for moderate pain or severe pain. 10 tablet 0  . hydrocortisone cream 0.5 % Apply 1 application topically 2 (two) times daily as needed for itching.    Marland Kitchen ibuprofen (ADVIL,MOTRIN) 800 MG tablet Take 1 tablet (800 mg total) by mouth every 8 (eight) hours as needed for moderate pain. 60 tablet 0  . lisinopril (PRINIVIL,ZESTRIL) 10 MG tablet Take 10 mg by mouth daily.    . Multiple Vitamins-Minerals (HAIR/SKIN/NAILS/BIOTIN PO) Take 1 tablet by mouth daily.    . ondansetron (ZOFRAN ODT) 4 MG disintegrating tablet Take 1 tablet (4 mg total)  by mouth every 8 (eight) hours as needed for nausea or vomiting. 20 tablet 0  . traMADol (ULTRAM) 50 MG tablet Take 1 tablet (50 mg total) by mouth every 8 (eight) hours as needed. (Patient taking differently: Take 50 mg by mouth every 8 (eight) hours as needed for moderate pain. ) 15 tablet 0   No current facility-administered medications for this visit.     Allergies:  Allergies  Allergen Reactions  . Keflex [Cephalexin] Anaphylaxis  . Penicillins Anaphylaxis    Past Surgical History:  Procedure Laterality Date  . ABDOMINAL HYSTERECTOMY    . BREAST LUMPECTOMY  10/11   rt-neg  . BREAST SURGERY  2004   fibroademoma  . CERVICAL CONE BIOPSY  2000  . COLON RESECTION N/A 07/06/2014   Procedure: LAPAROSCOPIC LOOP ILEOSTOMY WITH PLACEMENT OF PELVIC DRAIN;  Surgeon: Donnie Mesa, MD;  Location: [facility];  Service: General;  Laterality: N/A;  . COLON SURGERY    . DILATION AND CURETTAGE OF UTERUS  2009  . DIVERTING ILEOSTOMY Right 06/2014  . ECTOPIC PREGNANCY SURGERY  2005   R salpyngectomy  . ILEOSTOMY N/A 10/26/2014   Procedure: LOOP ILEOSTOMY REVERSAL;  Surgeon: Donnie Mesa, MD;  Location: [facility];  Service: General;  Laterality: N/A;  . LAPAROSCOPIC SIGMOID COLECTOMY N/A 06/30/2014   Procedure: LAPAROSCOPIC  ASSISTED SIGMOID COLECTOMY;  Surgeon: Donnie Mesa, MD;  Location: [facility];  Service: General;  Laterality: N/A;  . MYOMECTOMY  2008   robotic  . POLYPECTOMY  2009  . ROBOTIC ASSISTED LAP VAGINAL HYSTERECTOMY  2010   supracervical  . SALPINGECTOMY  2005  . TRIGGER FINGER RELEASE Right 09/28/2012   Procedure: RELEASE TRIGGER FINGER/A-1 PULLEY RIGHT THUMB;  Surgeon: Jolyn Nap, MD;  Location: [facility];  Service: Orthopedics;  Laterality: Right;    Social History   Socioeconomic History  . Marital status: Married    Spouse name: None  . Number of children: None  . Years of education: None  . Highest education level: None  Social Needs  . Financial resource  strain: None  . Food insecurity - worry: None  . Food insecurity - inability: None  . Transportation needs - medical: None  . Transportation needs - non-medical: None  Occupational History  . None  Tobacco Use  . Smoking status: Former Smoker    Packs/day: 0.25    Years: 15.00    Pack years: 3.75    Types: Cigarettes    Last attempt to quit: 06/12/2011    Years since quitting: 5.6  . Smokeless tobacco: Never Used  Substance and Sexual Activity  . Alcohol use: No    Alcohol/week: 1.5 oz    Types: 3 Standard drinks or equivalent per week  . Drug use: No  . Sexual activity: Yes  Other Topics Concern  . None  Social History Narrative  . None    Family History  Problem Relation Age of Onset  . Hypertension Paternal Grandmother   . Diabetes Paternal Grandmother   . Hypertension Maternal Grandmother   . Sarcoidosis Mother   . Hypertension Mother   . Thyroid disease Maternal Aunt   . Mental illness Cousin      ROS Review of Systems See HPI Constitution: No fevers or chills No malaise No diaphoresis Skin: No rash or itching Eyes: no blurry vision, no double vision GU: no dysuria or hematuria Neuro: no dizziness or headaches all others reviewed and negative   Objective: Vitals:   02/05/17 1702  BP: (!) 140/95  Pulse: (!) 108  Resp: 16  Temp: 98 F (36.7 C)  TempSrc: Oral  SpO2: 98%  Weight: 216 lb (98 kg)    Physical Exam  Constitutional: She appears well-developed and well-nourished.  HENT:  Head: Normocephalic and atraumatic.  Pulmonary/Chest: Effort normal.   Cap refill <2s Right 5th digit with 1cm horizontal laceration at the crease of the PIP joint   Assessment and Plan [name] was seen today for laceration.  Diagnoses and all orders for this visit:  Laceration of right ring finger without foreign body without damage to nail, initial encounter Pain of finger of right hand   -  Continue pressure dressing and topical polysporin if needed     [place]

## 2017-02-05 NOTE — Patient Instructions (Addendum)
IF you received an x-ray today, you will receive an invoice from [facility]. Please contact [facility] at [phone] with questions or concerns regarding your invoice.   IF you received labwork today, you will receive an invoice from [place]. Please contact [facility] at [phone] with questions or concerns regarding your invoice.   Our billing staff will not be able to assist you with questions regarding bills from these companies.  You will be contacted with the lab results as soon as they are available. The fastest way to get your results is to activate your My Chart account. Instructions are located on the last page of this paperwork. If you have not heard from [place] regarding the results in 2 weeks, please contact this office.      Stitches, Staples, or Adhesive Wound Closure Doctors use stitches (sutures), staples, and certain glue (skin adhesives) to hold your skin together while it heals (wound closure). You may need this treatment after you have surgery or if you cut your skin accidentally. These methods help your skin heal more quickly. They also make it less likely that you will have a scar. What are the different kinds of wound closures? There are many options for wound closure. The one that your doctor uses depends on how deep and large your wound is. Adhesive Glue To use this glue to close a wound, your doctor holds the edges of the wound together and paints the glue on the surface of your skin. You may need more than one layer of glue. Then the wound may be covered with a light bandage (dressing). This type of skin closure may be used for small wounds that are not deep (superficial). Using glue for wound closure is less painful than other methods. It does not require a medicine that numbs the area. This method also leaves nothing to be removed. Adhesive glue is often used for children and on facial wounds. Adhesive glue cannot be used for wounds that are  deep, uneven, or bleeding. It is not used inside of a wound. Adhesive Strips These strips are made of sticky (adhesive), porous paper. They are placed across your skin edges like a regular adhesive bandage. You leave them on until they fall off. Adhesive strips may be used to close very superficial wounds. They may also be used along with sutures to improve closure of your skin edges. Sutures Sutures are the oldest method of wound closure. Sutures can be made from natural or synthetic materials. They can be made from a material that your body can break down as your wound heals (absorbable), or they can be made from a material that needs to be removed from your skin (nonabsorbable). They come in many different strengths and sizes. Your doctor attaches the sutures to a steel needle on one end. Sutures can be passed through your skin, or through the tissues beneath your skin. Then they are tied and cut. Your skin edges may be closed in one continuous stitch or in separate stitches. Sutures are strong and can be used for all kinds of wounds. Absorbable sutures may be used to close tissues under the skin. The disadvantage of sutures is that they may cause skin reactions that lead to infection. Nonabsorbable sutures need to be removed. Staples When surgical staples are used to close a wound, the edges of your skin on both sides of the wound are brought close together. A staple is placed across the wound, and an instrument secures the edges  together. Staples are often used to close surgical cuts (incisions). Staples are faster to use than sutures, and they cause less reaction from your skin. Staples need to be removed using a tool that bends the staples away from your skin. How do I care for my wound closure?  Take medicines only as told by your doctor.  If you were prescribed an antibiotic medicine for your wound, finish it all even if you start to feel better.  Use ointments or creams only as told by  your doctor.  Wash your hands with soap and water before and after touching your wound.  Do not soak your wound in water. Do not take baths, swim, or use a hot tub until your doctor says it is okay.  Ask your doctor when you can start showering. Cover your wound if told by your doctor.  Do not take out your own sutures or staples.  Do not pick at your wound. Picking can cause an infection.  Keep all follow-up visits as told by your doctor. This is important. How long will I have my wound closure?  Leave adhesive glue on your skin until the glue peels away.  Leave adhesive strips on your skin until they fall off.  Absorbable sutures will dissolve within several days.  Nonabsorbable sutures and staples must be removed. The location of the wound will determine how long they stay in. This can range from several days to a couple of weeks. When should I seek help for my wound closure? Contact your doctor if:  You have a fever.  You have chills.  You have redness, puffiness (swelling), or pain at the site of your wound.  You have fluid, blood, or pus coming from your wound.  There is a bad smell coming from your wound.  The skin edges of your wound start to separate after your sutures have been removed.  Your wound becomes thick, raised, and darker in color after your sutures come out (scarring).  This information is not intended to replace advice given to you by your health care provider. Make sure you discuss any questions you have with your health care provider. Document Released: 12/02/2008 Document Revised: 10/04/2015 Document Reviewed: 07/14/2013 Elsevier Interactive Patient Education  [employer].

## 2017-02-10 MED FILL — LISINOPRIL 10 MG TABS: 10 | 60 days supply | Qty: 60 | Fill #2

## 2017-02-10 MED FILL — ATORVASTATIN 20 MG TABLET: 20 | 90 days supply | Qty: 90 | Fill #1

## 2017-02-14 ENCOUNTER — Encounter: Payer: Self-pay | Admitting: Physician Assistant

## 2017-02-14 ENCOUNTER — Ambulatory Visit (INDEPENDENT_AMBULATORY_CARE_PROVIDER_SITE_OTHER): Payer: 59 | Admitting: Physician Assistant

## 2017-02-14 VITALS — BP 137/81 | HR 120 | Temp 98.3°F | Resp 18 | Ht 64.0 in | Wt 215.2 lb

## 2017-02-14 DIAGNOSIS — Z90711 Acquired absence of uterus with remaining cervical stump: Secondary | ICD-10-CM | POA: Insufficient documentation

## 2017-02-14 DIAGNOSIS — M25511 Pain in right shoulder: Secondary | ICD-10-CM | POA: Diagnosis not present

## 2017-02-14 MED ORDER — MELOXICAM 15 MG PO TABS: 15.0000 mg | ORAL_TABLET | Freq: Every day | ORAL | 0 refills | Status: DC

## 2017-02-14 NOTE — Progress Notes (Signed)
Patient ID: [name], female    DOB: 09/14/1970, 46 y.o.   MRN: [id number]  PCP: Leeroy Cha, MD, changing to Dr. Delfina Redwood at [facility] in March.  Chief Complaint  Patient presents with  . Shoulder Pain    x27months,right shoulder, Pt states she had a cortisone shot at the end of November and states it didn't help.    Subjective:   Presents for evaluation of RIGHT shoulder pain.  Pain in the RIGHT shoulder x 2 months. Saw ortho and had an injection in November, which was ineffective. Was advised to follow-up if it wasn't helpful, and anticipated need for PT. The next available appointment is 02/28/2017. 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 RIGHT hand dominant. She is an avid cook, bakes/frosts cakes.  Pain sometimes radiates into the arm. No weak or dropping things. Able to perform her ADLs and work, but with worsening pain.    Review of Systems As above.    Patient Active Problem List   Diagnosis Date Noted  . Trigger ring finger of right hand 08/07/2015  . Primary osteoarthritis of first carpometacarpal joint of right hand 08/07/2015  . Diverticulitis of sigmoid colon s/p colectomy 06/30/2014 07/26/2014  . HTN (hypertension) 11/02/2013  . Former smoker 11/26/2008     Prior to Admission medications   Medication Sig Start Date End Date Taking? Authorizing Provider  atorvastatin (LIPITOR) 20 MG tablet Take 20 mg by mouth daily. 07/26/16  Yes [provider]  Black Cohosh (REMIFEMIN) 20 MG TABS Take 40 mg by mouth daily.   Yes [provider]  dicyclomine (BENTYL) 20 MG tablet Take 1 tablet (20 mg total) by mouth 2 (two) times daily. 01/24/17  Yes Rancour, Annie Main, MD  hydrocortisone cream 0.5 % Apply 1 application topically 2 (two) times daily as needed for itching.   Yes [provider]  lisinopril (PRINIVIL,ZESTRIL) 10 MG tablet Take 10 mg by mouth daily.   Yes [provider]  Multiple  Vitamins-Minerals (HAIR/SKIN/NAILS/BIOTIN PO) Take 1 tablet by mouth daily.   Yes [provider]  HYDROcodone-acetaminophen (NORCO/VICODIN) 5-325 MG tablet Take 1 tablet by mouth every 6 (six) hours as needed for moderate pain or severe pain. Patient not taking: Reported on 02/14/2017 10/22/16   Vanessa Kick, MD  ibuprofen (ADVIL,MOTRIN) 800 MG tablet Take 1 tablet (800 mg total) by mouth every 8 (eight) hours as needed for moderate pain. Patient not taking: Reported on 02/14/2017 08/24/16   Gale Journey, Damaris Hippo, PA-C  ondansetron (ZOFRAN ODT) 4 MG disintegrating tablet Take 1 tablet (4 mg total) by mouth every 8 (eight) hours as needed for nausea or vomiting. Patient not taking: Reported on 02/14/2017 01/24/17   Rancour, Annie Main, MD  traMADol (ULTRAM) 50 MG tablet Take 1 tablet (50 mg total) by mouth every 8 (eight) hours as needed. Patient not taking: Reported on 02/14/2017 08/27/16   Ivar Drape D, PA     Allergies  Allergen Reactions  . Keflex [Cephalexin] Anaphylaxis  . Penicillins Anaphylaxis       Objective:  Physical Exam  Constitutional: She is oriented to person, place, and time. She appears well-developed and well-nourished. She is active and cooperative. No distress.  BP 137/81 (BP Location: Right Arm, Patient Position: Sitting, Cuff Size: Normal)   Pulse (!) 120   Temp 98.3 F (36.8 C) (Oral)   Resp 18   Ht 5\' 4"  (1.626 m)   Wt 215 lb 3.2 oz (97.6 kg)  SpO2 97%   BMI 36.94 kg/m    Eyes: Conjunctivae are normal.  Pulmonary/Chest: Effort normal.  Musculoskeletal:       Right shoulder: She exhibits decreased range of motion (due to pain at the [facility] joint), tenderness (point tender at the [facility] joint. No other tenderness.) and pain (specifically at the [facility] joint). She exhibits no crepitus, normal pulse and normal strength.  Neurological: She is alert and oriented to person, place, and time.  Psychiatric: She has a normal mood and affect. Her speech is normal and  behavior is normal.           Assessment & Plan:   1. Acute pain of right shoulder Add meloxicam. Ice. Rest. Proceed with specialty follow-up 02/28/2017 as planned. - meloxicam (MOBIC) 15 MG tablet; Take 1 tablet (15 mg total) by mouth daily.  Dispense: 30 tablet; Refill: 0    Return if symptoms worsen or fail to improve.   Fara Chute, PA-C [facility]

## 2017-02-14 NOTE — Patient Instructions (Addendum)
Take the meloxicam once a day with food. Ice the shoulder for 15-20 minutes 2-3 times each day. Follow-up with Dr. Alfonso Ramus on 02/28/2017, as planned.    IF you received an x-ray today, you will receive an invoice from [facility]. Please contact [facility] at [phone] with questions or concerns regarding your invoice.   IF you received labwork today, you will receive an invoice from [place]. Please contact [facility] at [phone] with questions or concerns regarding your invoice.   Our billing staff will not be able to assist you with questions regarding bills from these companies.  You will be contacted with the lab results as soon as they are available. The fastest way to get your results is to activate your My Chart account. Instructions are located on the last page of this paperwork. If you have not heard from [place] regarding the results in 2 weeks, please contact this office.

## 2017-02-28 DIAGNOSIS — M7581 Other shoulder lesions, right shoulder: Secondary | ICD-10-CM | POA: Diagnosis not present

## 2017-04-25 MED FILL — ATORVASTATIN 20 MG TABLET: 20 | 30 days supply | Qty: 30 | Fill #0

## 2017-04-25 MED FILL — LISINOPRIL 10 MG TABS: 10 | 30 days supply | Qty: 30 | Fill #0

## 2017-05-28 ENCOUNTER — Encounter: Payer: Self-pay | Admitting: Physician Assistant

## 2017-05-29 ENCOUNTER — Ambulatory Visit (INDEPENDENT_AMBULATORY_CARE_PROVIDER_SITE_OTHER): Payer: 59 | Admitting: Family Medicine

## 2017-05-29 ENCOUNTER — Encounter: Payer: Self-pay | Admitting: Family Medicine

## 2017-05-29 VITALS — BP 131/86 | HR 103 | Temp 98.0°F | Resp 16 | Ht 64.0 in | Wt 215.0 lb

## 2017-05-29 DIAGNOSIS — K5732 Diverticulitis of large intestine without perforation or abscess without bleeding: Secondary | ICD-10-CM

## 2017-05-29 DIAGNOSIS — R1031 Right lower quadrant pain: Secondary | ICD-10-CM

## 2017-05-29 LAB — POCT URINALYSIS DIP (MANUAL ENTRY)
Bilirubin, UA: NEGATIVE
Blood, UA: NEGATIVE
Glucose, UA: NEGATIVE mg/dL
Ketones, POC UA: NEGATIVE mg/dL
LEUKOCYTES UA: NEGATIVE
NITRITE UA: NEGATIVE
PH UA: 5.5 (ref 5.0–8.0)
PROTEIN UA: NEGATIVE mg/dL
Spec Grav, UA: >=1.03 — AB (ref 1.010–1.025)
Urobilinogen, UA: 0.2 E.U./dL

## 2017-05-29 LAB — POCT CBC
Granulocyte percent: 41.3 %G (ref 37–80)
HCT, POC: 39.1 % (ref 37.7–47.9)
HEMOGLOBIN: 12.6 g/dL (ref 12.2–16.2)
LYMPH, POC: 3.8 — AB (ref 0.6–3.4)
MCH, POC: 28.8 pg (ref 27–31.2)
MCHC: 32.2 g/dL (ref 31.8–35.4)
MCV: 89.5 fL (ref 80–97)
MID (cbc): 0.8 (ref 0–0.9)
MPV: 8.9 fL (ref 0–99.8)
POC Granulocyte: 3.2 (ref 2–6.9)
POC LYMPH %: 48.9 % (ref 10–50)
POC MID %: 9.8 %M (ref 0–12)
Platelet Count, POC: 298 10*3/uL (ref 142–424)
RBC: 4.36 M/uL (ref 4.04–5.48)
RDW, POC: 13.7 %
WBC: 7.7 10*3/uL (ref 4.6–10.2)

## 2017-05-29 MED ORDER — METRONIDAZOLE 500 MG PO TABS: 500.0000 mg | ORAL_TABLET | Freq: Three times a day (TID) | ORAL | 0 refills | Status: AC

## 2017-05-29 MED ORDER — CIPROFLOXACIN HCL 500 MG PO TABS: 500.0000 mg | ORAL_TABLET | Freq: Two times a day (BID) | ORAL | 0 refills | Status: AC

## 2017-05-29 NOTE — Patient Instructions (Signed)
Diverticulitis °Diverticulitis is infection or inflammation of small pouches (diverticula) in the colon that form due to a condition called diverticulosis. Diverticula can trap stool (feces) and bacteria, causing infection and inflammation. °Diverticulitis may cause severe stomach pain and diarrhea. It may lead to tissue damage in the colon that causes bleeding. The diverticula may also burst (rupture) and cause infected stool to enter other areas of the abdomen. °Complications of diverticulitis can include: °· Bleeding. °· Severe infection. °· Severe pain. °· Rupture (perforation) of the colon. °· Blockage (obstruction) of the colon. ° °What are the causes? °This condition is caused by stool becoming trapped in the diverticula, which allows bacteria to grow in the diverticula. This leads to inflammation and infection. °What increases the risk? °You are more likely to develop this condition if: °· You have diverticulosis. The risk for diverticulosis increases if: °? You are overweight or obese. °? You use tobacco products. °? You do not get enough exercise. °· You eat a diet that does not include enough fiber. High-fiber foods include fruits, vegetables, beans, nuts, and whole grains. ° °What are the signs or symptoms? °Symptoms of this condition may include: °· Pain and tenderness in the abdomen. The pain is normally located on the left side of the abdomen, but it may occur in other areas. °· Fever and chills. °· Bloating. °· Cramping. °· Nausea. °· Vomiting. °· Changes in bowel routines. °· Blood in your stool. ° °How is this diagnosed? °This condition is diagnosed based on: °· Your medical history. °· A physical exam. °· Tests to make sure there is nothing else causing your condition. These tests may include: °? Blood tests. °? Urine tests. °? Imaging tests of the abdomen, including X-rays, ultrasounds, MRIs, or CT scans. ° °How is this treated? °Most cases of this condition are mild and can be treated at home.  Treatment may include: °· Taking over-the-counter pain medicines. °· Following a clear liquid diet. °· Taking antibiotic medicines by mouth. °· Rest. ° °More severe cases may need to be treated at a hospital. Treatment may include: °· Not eating or drinking. °· Taking prescription pain medicine. °· Receiving antibiotic medicines through an IV tube. °· Receiving fluids and nutrition through an IV tube. °· Surgery. ° °When your condition is under control, your health care provider may recommend that you have a colonoscopy. This is an exam to look at the entire large intestine. During the exam, a lubricated, bendable tube is inserted into the anus and then passed into the rectum, colon, and other parts of the large intestine. A colonoscopy can show how severe your diverticula are and whether something else may be causing your symptoms. °Follow these instructions at home: °Medicines °· Take over-the-counter and prescription medicines only as told by your health care provider. These include fiber supplements, probiotics, and stool softeners. °· If you were prescribed an antibiotic medicine, take it as told by your health care provider. Do not stop taking the antibiotic even if you start to feel better. °· Do not drive or use heavy machinery while taking prescription pain medicine. °General instructions °· Follow a full liquid diet or another diet as directed by your health care provider. After your symptoms improve, your health care provider may tell you to change your diet. He or she may recommend that you eat a diet that contains at least 25 g (25 grams) of fiber daily. Fiber makes it easier to pass stool. Healthy sources of fiber include: °? Berries. One cup   contains 4-8 grams of fiber. °? Beans or lentils. One half cup contains 5-8 grams of fiber. °? Green vegetables. One cup contains 4 grams of fiber. °· Exercise for at least 30 minutes, 3 times each week. You should exercise hard enough to raise your heart rate and  break a sweat. °· Keep all follow-up visits as told by your health care provider. This is important. You may need a colonoscopy. °Contact a health care provider if: °· Your pain does not improve. °· You have a hard time drinking or eating food. °· Your bowel movements do not return to normal. °Get help right away if: °· Your pain gets worse. °· Your symptoms do not get better with treatment. °· Your symptoms suddenly get worse. °· You have a fever. °· You vomit more than one time. °· You have stools that are bloody, black, or tarry. °Summary °· Diverticulitis is infection or inflammation of small pouches (diverticula) in the colon that form due to a condition called diverticulosis. Diverticula can trap stool (feces) and bacteria, causing infection and inflammation. °· You are at higher risk for this condition if you have diverticulosis and you eat a diet that does not include enough fiber. °· Most cases of this condition are mild and can be treated at home. More severe cases may need to be treated at a hospital. °· When your condition is under control, your health care provider may recommend that you have an exam called a colonoscopy. This exam can show how severe your diverticula are and whether something else may be causing your symptoms. °This information is not intended to replace advice given to you by your health care provider. Make sure you discuss any questions you have with your health care provider. °Document Released: 11/14/2004 Document Revised: 03/09/2016 Document Reviewed: 03/09/2016 °Elsevier Interactive Patient Education © 2018 [employer] ° °

## 2017-05-29 NOTE — Progress Notes (Signed)
Chief Complaint  Patient presents with  . Abdominal Pain    bilateral abdominal pain    HPI   Pt feels RLQ pain when she is walking The RLQ is where she had her ostomy bag She states that she also has tenderness on the left side She states that she felt some intermittent cramps Her flare of diverticulosis is usually on the left side  The pain is sharp and goes away Her last flare was 05/20/2014 which was her 5th flare She ate food at lunch and felt cramping on the left side after lunch  4 review of systems  Past Medical History:  Diagnosis Date  . Abnormal Pap smear 2000   [facility]  . Allergy   . Back pain   . Breast fibroadenoma   . Diverticulitis of colon with perforation 05/20/2014  . GERD (gastroesophageal reflux disease)   . H/O metrorrhagia 12/2006  . H/O myomectomy 07-2006   robotic  . H/O sinusitis   . Headache(784.0)   . History of conization of cervix 2000  . History of ectopic pregnancy 2005   r salpyngectomy  . History of hysterectomy, supracervical 08-2008   robotic  . History of syphilis   . History of syphilis   . Hx of herpes simplex type 2 infection   . Hypertension   . Ileostomy present ([name]) 10/26/2014  . Intra-abdominal abscess ([place]) 07/21/2014  . PMS (premenstrual syndrome)     Current Outpatient Medications  Medication Sig Dispense Refill  . atorvastatin (LIPITOR) 20 MG tablet Take 20 mg by mouth daily.  0  . Black Cohosh (REMIFEMIN) 20 MG TABS Take 40 mg by mouth daily.    . ciprofloxacin (CIPRO) 500 MG tablet Take 1 tablet (500 mg total) by mouth 2 (two) times daily for 7 days. 14 tablet 0  . dicyclomine (BENTYL) 20 MG tablet Take 1 tablet (20 mg total) by mouth 2 (two) times daily. 20 tablet 0  . hydrocortisone cream 0.5 % Apply 1 application topically 2 (two) times daily as needed for itching.    Marland Kitchen lisinopril (PRINIVIL,ZESTRIL) 10 MG tablet Take 10 mg by mouth daily.    . meloxicam (MOBIC) 15 MG tablet Take 1 tablet (15 mg total) by mouth  daily. 30 tablet 0  . metroNIDAZOLE (FLAGYL) 500 MG tablet Take 1 tablet (500 mg total) by mouth 3 (three) times daily for 7 days. 21 tablet 0  . Multiple Vitamins-Minerals (HAIR/SKIN/NAILS/BIOTIN PO) Take 1 tablet by mouth daily.     No current facility-administered medications for this visit.     Allergies:  Allergies  Allergen Reactions  . Keflex [Cephalexin] Anaphylaxis  . Penicillins Anaphylaxis    Past Surgical History:  Procedure Laterality Date  . ABDOMINAL HYSTERECTOMY    . BREAST LUMPECTOMY  10/11   rt-neg  . BREAST SURGERY  2004   fibroademoma  . CERVICAL CONE BIOPSY  2000  . COLON RESECTION N/A 07/06/2014   Procedure: LAPAROSCOPIC LOOP ILEOSTOMY WITH PLACEMENT OF PELVIC DRAIN;  Surgeon: Donnie Mesa, MD;  Location: [place];  Service: General;  Laterality: N/A;  . COLON SURGERY    . DILATION AND CURETTAGE OF UTERUS  2009  . DIVERTING ILEOSTOMY Right 06/2014  . ECTOPIC PREGNANCY SURGERY  2005   R salpyngectomy  . ILEOSTOMY N/A 10/26/2014   Procedure: LOOP ILEOSTOMY REVERSAL;  Surgeon: Donnie Mesa, MD;  Location: [place];  Service: General;  Laterality: N/A;  . LAPAROSCOPIC SIGMOID COLECTOMY N/A 06/30/2014   Procedure: LAPAROSCOPIC ASSISTED  SIGMOID COLECTOMY;  Surgeon: Donnie Mesa, MD;  Location: [place];  Service: General;  Laterality: N/A;  . MYOMECTOMY  2008   robotic  . POLYPECTOMY  2009  . ROBOTIC ASSISTED LAP VAGINAL HYSTERECTOMY  2010   supracervical  . SALPINGECTOMY  2005  . TRIGGER FINGER RELEASE Right 09/28/2012   Procedure: RELEASE TRIGGER FINGER/A-1 PULLEY RIGHT THUMB;  Surgeon: Jolyn Nap, MD;  Location: [place];  Service: Orthopedics;  Laterality: Right;    Social History   Socioeconomic History  . Marital status: Married    Spouse name: [name]  . Number of children: 1  . Years of education: Not on file  . Highest education level: Associate degree: occupational, Hotel manager, or vocational program  Occupational History  .  Occupation: CMA    Comment: Primary Care at [employer]  . Financial resource strain: Not on file  . Food insecurity:    Worry: Not on file    Inability: Not on file  . Transportation needs:    Medical: Not on file    Non-medical: Not on file  Tobacco Use  . Smoking status: Former Smoker    Packs/day: 0.25    Years: 15.00    Pack years: 3.75    Types: Cigarettes    Last attempt to quit: 06/12/2009    Years since quitting: 7.9  . Smokeless tobacco: Never Used  Substance and Sexual Activity  . Alcohol use: No    Alcohol/week: 1.5 oz    Types: 3 Standard drinks or equivalent per week  . Drug use: No  . Sexual activity: Yes  Lifestyle  . Physical activity:    Days per week: Not on file    Minutes per session: Not on file  . Stress: Not on file  Relationships  . Social connections:    Talks on phone: Not on file    Gets together: Not on file    Attends religious service: Not on file    Active member of club or organization: Not on file    Attends meetings of clubs or organizations: Not on file    Relationship status: Not on file  Other Topics Concern  . Not on file  Social History Narrative   Lives with her husband and their son.    Family History  Problem Relation Age of Onset  . Hypertension Paternal Grandmother   . Diabetes Paternal Grandmother   . Hypertension Maternal Grandmother   . Sarcoidosis Mother   . Hypertension Mother   . Thyroid disease Maternal Aunt   . Mental illness Cousin      ROS Review of Systems See HPI Constitution: No fevers or chills No malaise No diaphoresis Skin: No rash or itching Eyes: no blurry vision, no double vision GU: no dysuria or hematuria Neuro: no dizziness or headaches all others reviewed and negative   Objective: Vitals:   05/29/17 1603  BP: 131/86  Pulse: (!) 103  Resp: 16  Temp: 98 F (36.7 C)  TempSrc: Oral  SpO2: 99%  Weight: 215 lb (97.5 kg)  Height: 5\' 4"  (1.626 m)     Physical Exam    Constitutional: She appears well-developed and well-nourished.  HENT:  Head: Normocephalic and atraumatic.  Eyes: Pupils are equal, round, and reactive to light. EOM are normal.  Cardiovascular: Normal rate, regular rhythm and normal heart sounds.  Pulmonary/Chest: Effort normal and breath sounds normal. No stridor. No respiratory distress. She has no wheezes. She has no rhonchi.  She has no rales. She exhibits no tenderness.  Abdominal: There is no hepatosplenomegaly. There is tenderness in the left lower quadrant. There is no rigidity, no rebound, no guarding, no CVA tenderness, no tenderness at McBurney's point and negative Murphy's sign. No hernia.  Skin: Skin is warm. Capillary refill takes less than 2 seconds.  Psychiatric: She has a normal mood and affect. Her behavior is normal.     Assessment and Plan [name] was seen today for abdominal pain.  Diagnoses and all orders for this visit:  Diverticulitis of sigmoid colon s/p colectomy 06/30/2014 -     POCT CBC -     POCT urinalysis dipstick -     Comprehensive metabolic panel  RLQ abdominal pain -     POCT CBC -     POCT urinalysis dipstick -     Comprehensive metabolic panel  Other orders -     ciprofloxacin (CIPRO) 500 MG tablet; Take 1 tablet (500 mg total) by mouth 2 (two) times daily for 7 days. -     metroNIDAZOLE (FLAGYL) 500 MG tablet; Take 1 tablet (500 mg total) by mouth 3 (three) times daily for 7 days.    Will treat empirically for diverticulitis flare Discussed antibiotics Gave ER precautions and dietary modifications Benign exam in the RLQ, no mcburney's point, no leukocytosis  Zoe A Nolon Rod

## 2017-05-30 DIAGNOSIS — I10 Essential (primary) hypertension: Secondary | ICD-10-CM | POA: Diagnosis not present

## 2017-05-30 DIAGNOSIS — E663 Overweight: Secondary | ICD-10-CM | POA: Diagnosis not present

## 2017-05-30 DIAGNOSIS — E7800 Pure hypercholesterolemia, unspecified: Secondary | ICD-10-CM | POA: Diagnosis not present

## 2017-05-30 LAB — COMPREHENSIVE METABOLIC PANEL
A/G RATIO: 1.7 (ref 1.2–2.2)
ALT: 21 IU/L (ref 0–32)
AST: 21 IU/L (ref 0–40)
Albumin: 4.5 g/dL (ref 3.5–5.5)
Alkaline Phosphatase: 60 IU/L (ref 39–117)
BILIRUBIN TOTAL: 0.3 mg/dL (ref 0.0–1.2)
BUN / CREAT RATIO: 17 (ref 9–23)
BUN: 13 mg/dL (ref 6–24)
CALCIUM: 10.1 mg/dL (ref 8.7–10.2)
CHLORIDE: 103 mmol/L (ref 96–106)
CO2: 25 mmol/L (ref 20–29)
Creatinine, Ser: 0.75 mg/dL (ref 0.57–1.00)
GFR, EST AFRICAN AMERICAN: 109 mL/min/{1.73_m2} (ref >59)
GFR, EST NON AFRICAN AMERICAN: 95 mL/min/{1.73_m2} (ref >59)
GLOBULIN, TOTAL: 2.6 g/dL (ref 1.5–4.5)
Glucose: 105 mg/dL — ABNORMAL HIGH (ref 65–99)
POTASSIUM: 4.3 mmol/L (ref 3.5–5.2)
SODIUM: 143 mmol/L (ref 134–144)
TOTAL PROTEIN: 7.1 g/dL (ref 6.0–8.5)

## 2017-05-30 MED FILL — ATORVASTATIN 20 MG TABLET: 20 | 90 days supply | Qty: 90 | Fill #0

## 2017-05-30 MED FILL — LISINOPRIL 10 MG TABLET: 10 | 90 days supply | Qty: 90 | Fill #0

## 2017-06-02 ENCOUNTER — Ambulatory Visit: Payer: 59 | Admitting: Family Medicine

## 2017-07-03 ENCOUNTER — Encounter: Payer: Self-pay | Admitting: Family Medicine

## 2017-07-03 ENCOUNTER — Ambulatory Visit (INDEPENDENT_AMBULATORY_CARE_PROVIDER_SITE_OTHER): Payer: 59 | Admitting: Family Medicine

## 2017-07-03 VITALS — BP 133/91 | HR 101 | Temp 99.3°F | Resp 17 | Ht 64.0 in | Wt 214.2 lb

## 2017-07-03 DIAGNOSIS — M5432 Sciatica, left side: Secondary | ICD-10-CM

## 2017-07-03 DIAGNOSIS — R0981 Nasal congestion: Secondary | ICD-10-CM | POA: Diagnosis not present

## 2017-07-03 MED ORDER — GABAPENTIN 100 MG PO CAPS: ORAL_CAPSULE | ORAL | 3 refills | Status: DC

## 2017-07-03 MED ORDER — FLUTICASONE PROPIONATE 50 MCG/ACT NA SUSP: 2.0000 | Freq: Every day | NASAL | 6 refills | Status: DC

## 2017-07-03 NOTE — Patient Instructions (Addendum)
IF you received an x-ray today, you will receive an invoice from [facility]. Please contact [facility] at [phone] with questions or concerns regarding your invoice.   IF you received labwork today, you will receive an invoice from [facility]. Please contact [facility] at [phone] with questions or concerns regarding your invoice.   Our billing staff will not be able to assist you with questions regarding bills from these companies.  You will be contacted with the lab results as soon as they are available. The fastest way to get your results is to activate your My Chart account. Instructions are located on the last page of this paperwork. If you have not heard from [place] regarding the results in 2 weeks, please contact this office.     Sciatica Sciatica is pain, numbness, weakness, or tingling along the path of the sciatic nerve. The sciatic nerve starts in the lower back and runs down the back of each leg. The nerve controls the muscles in the lower leg and in the back of the knee. It also provides feeling (sensation) to the back of the thigh, the lower leg, and the sole of the foot. Sciatica is a symptom of another medical condition that pinches or puts pressure on the sciatic nerve. Generally, sciatica only affects one side of the body. Sciatica usually goes away on its own or with treatment. In some cases, sciatica may keep coming back (recur). What are the causes? This condition is caused by pressure on the sciatic nerve, or pinching of the sciatic nerve. This may be the result of:  A disk in between the bones of the spine (vertebrae) bulging out too far (herniated disk).  Age-related changes in the spinal disks (degenerative disk disease).  A pain disorder that affects a muscle in the buttock (piriformis syndrome).  Extra bone growth (bone spur) near the sciatic nerve.  An injury or break (fracture) of the pelvis.  Pregnancy.  Tumor (rare).  What  increases the risk? The following factors may make you more likely to develop this condition:  Playing sports that place pressure or stress on the spine, such as football or weight lifting.  Having poor strength and flexibility.  A history of back injury.  A history of back surgery.  Sitting for long periods of time.  Doing activities that involve repetitive bending or lifting.  Obesity.  What are the signs or symptoms? Symptoms can vary from mild to very severe, and they may include:  Any of these problems in the lower back, leg, hip, or buttock: ? Mild tingling or dull aches. ? Burning sensations. ? Sharp pains.  Numbness in the back of the calf or the sole of the foot.  Leg weakness.  Severe back pain that makes movement difficult.  These symptoms may get worse when you cough, sneeze, or laugh, or when you sit or stand for long periods of time. Being overweight may also make symptoms worse. In some cases, symptoms may recur over time. How is this diagnosed? This condition may be diagnosed based on:  Your symptoms.  A physical exam. Your health care provider may ask you to do certain movements to check whether those movements trigger your symptoms.  You may have tests, including: ? Blood tests. ? X-rays. ? MRI. ? CT scan.  How is this treated? In many cases, this condition improves on its own, without any treatment. However, treatment may include:  Reducing or modifying physical activity during periods of pain.  Exercising and stretching to strengthen your abdomen and improve the flexibility of your spine.  Icing and applying heat to the affected area.  Medicines that help: ? To relieve pain and swelling. ? To relax your muscles.  Injections of medicines that help to relieve pain, irritation, and inflammation around the sciatic nerve (steroids).  Surgery.  Follow these instructions at home: Medicines  Take over-the-counter and prescription medicines  only as told by your health care provider.  Do not drive or operate heavy machinery while taking prescription pain medicine. Managing pain  If directed, apply ice to the affected area. ? Put ice in a plastic bag. ? Place a towel between your skin and the bag. ? Leave the ice on for 20 minutes, 2-3 times a day.  After icing, apply heat to the affected area before you exercise or as often as told by your health care provider. Use the heat source that your health care provider recommends, such as a moist heat pack or a heating pad. ? Place a towel between your skin and the heat source. ? Leave the heat on for 20-30 minutes. ? Remove the heat if your skin turns bright red. This is especially important if you are unable to feel pain, heat, or cold. You may have a greater risk of getting burned. Activity  Return to your normal activities as told by your health care provider. Ask your health care provider what activities are safe for you. ? Avoid activities that make your symptoms worse.  Take brief periods of rest throughout the day. Resting in a lying or standing position is usually better than sitting to rest. ? When you rest for longer periods, mix in some mild activity or stretching between periods of rest. This will help to prevent stiffness and pain. ? Avoid sitting for long periods of time without moving. Get up and move around at least one time each hour.  Exercise and stretch regularly, as told by your health care provider.  Do not lift anything that is heavier than 10 lb (4.5 kg) while you have symptoms of sciatica. When you do not have symptoms, you should still avoid heavy lifting, especially repetitive heavy lifting.  When you lift objects, always use proper lifting technique, which includes: ? Bending your knees. ? Keeping the load close to your body. ? Avoiding twisting. General instructions  Use good posture. ? Avoid leaning forward while sitting. ? Avoid hunching over  while standing.  Maintain a healthy weight. Excess weight puts extra stress on your back and makes it difficult to maintain good posture.  Wear supportive, comfortable shoes. Avoid wearing high heels.  Avoid sleeping on a mattress that is too soft or too hard. A mattress that is firm enough to support your back when you sleep may help to reduce your pain.  Keep all follow-up visits as told by your health care provider. This is important. Contact a health care provider if:  You have pain that wakes you up when you are sleeping.  You have pain that gets worse when you lie down.  Your pain is worse than you have experienced in the past.  Your pain lasts longer than 4 weeks.  You experience unexplained weight loss. Get help right away if:  You lose control of your bowel or bladder (incontinence).  You have: ? Weakness in your lower back, pelvis, buttocks, or legs that gets worse. ? Redness or swelling of your back. ? A burning sensation when you urinate. This  information is not intended to replace advice given to you by your health care provider. Make sure you discuss any questions you have with your health care provider. Document Released: 01/29/2001 Document Revised: 07/11/2015 Document Reviewed: 10/14/2014 Elsevier Interactive Patient Education  [employer].

## 2017-07-03 NOTE — Progress Notes (Signed)
Chief Complaint  Patient presents with  . Sciatica    left side     HPI  Left side low back pain Pt reports that she has been having radiating pain down her buttock to her left thigh.  She reports that she has a history of sciatica and this seems similar.  She denies left foot drop. No recent injury.  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 Abnormal Pap smear 2000   [facility]opsy  . Allergy   . Back pain   . Breast fibroadenoma   . Diverticulitis of colon with perforation 05/20/2014  . GERD (gastroesophageal reflux disease)   . H/O metrorrhagia 12/2006  . H/O myomectomy 07-2006   robotic  . H/O sinusitis   . Headache(784.0)   . History of conization of cervix 2000  . History of ectopic pregnancy 2005   r salpyngectomy  . History of hysterectomy, supracervical 08-2008   robotic  . History of syphilis   . History of syphilis   . Hx of herpes simplex type 2 infection   . Hypertension   . Ileostomy present ([place]) 10/26/2014  . Intra-abdominal abscess ([place]) 07/21/2014  . PMS (premenstrual syndrome)     Current Outpatient Medications  Medication Sig Dispense Refill  . Black Cohosh (REMIFEMIN) 20 MG TABS Take 40 mg by mouth daily.    . fluticasone (FLONASE) 50 MCG/ACT nasal spray Place 2 sprays into both nostrils daily. 16 g 6  . gabapentin (NEURONTIN) 100 MG capsule Take one capsule at bedtime then Increase by one capsule nightly up to 3 capsule 30 capsule 3  . hydrocortisone cream 0.5 % Apply 1 application topically 2 (two) times daily as needed for itching.    Marland Kitchen lisinopril (PRINIVIL,ZESTRIL) 10 MG tablet Take 1 tablet (10  mg total) by mouth daily. 90 tablet 1  . meloxicam (MOBIC) 15 MG tablet Take 1 tablet (15 mg total) by mouth daily. 30 tablet 0  . Multiple Vitamins-Minerals (HAIR/SKIN/NAILS/BIOTIN PO) Take 1 tablet by mouth daily.     No current facility-administered medications for this visit.     Allergies:  Allergies  Allergen Reactions  . Keflex [Cephalexin] Anaphylaxis  . Penicillins Anaphylaxis    Past Surgical History:  Procedure Laterality Date  . ABDOMINAL HYSTERECTOMY    . BREAST LUMPECTOMY  10/11   rt-neg  . BREAST SURGERY  2004   fibroademoma  . CERVICAL CONE BIOPSY  2000  . COLON RESECTION N/A 07/06/2014   Procedure: LAPAROSCOPIC LOOP ILEOSTOMY WITH PLACEMENT OF PELVIC DRAIN;  Surgeon: Donnie Mesa, MD;  Location: [place];  Service: General;  Laterality: N/A;  . COLON SURGERY    . DILATION AND CURETTAGE OF UTERUS  2009  . DIVERTING ILEOSTOMY Right 06/2014  . ECTOPIC PREGNANCY SURGERY  2005   R salpyngectomy  . ILEOSTOMY N/A 10/26/2014   Procedure: LOOP ILEOSTOMY REVERSAL;  Surgeon: Donnie Mesa, MD;  Location: [place];  Service: General;  Laterality: N/A;  . LAPAROSCOPIC SIGMOID COLECTOMY N/A 06/30/2014   Procedure: LAPAROSCOPIC ASSISTED SIGMOID COLECTOMY;  Surgeon: Donnie Mesa, MD;  Location: [place];  Service: General;  Laterality: N/A;  . MYOMECTOMY  2008   robotic  . POLYPECTOMY  2009  . ROBOTIC ASSISTED LAP VAGINAL HYSTERECTOMY  2010   supracervical  . SALPINGECTOMY  2005  . TRIGGER FINGER RELEASE Right 09/28/2012   Procedure: RELEASE TRIGGER FINGER/A-1 PULLEY RIGHT THUMB;  Surgeon: Jolyn Nap, MD;  Location: [place];  Service: Orthopedics;  Laterality: Right;    Social History   Socioeconomic History  . Marital status: Married    Spouse name: [name]  . Number of children: 1  . Years of education: Not on file  . Highest education level: Associate degree: occupational, Hotel manager, or vocational program  Occupational History  . Occupation: CMA     Comment: Primary Care at [employer]  . Financial resource strain: Not on file  . Food insecurity:    Worry: Not on file    Inability: Not on file  . Transportation needs:    Medical: Not on file    Non-medical: Not on file  Tobacco Use  . Smoking status: Former Smoker    Packs/day: 0.25    Years: 15.00    Pack years: 3.75    Types: Cigarettes    Last attempt to quit: 06/12/2009    Years since quitting: 8.1  . Smokeless tobacco: Never Used  Substance and Sexual Activity  . Alcohol use: No    Alcohol/week: 1.8 oz    Types: 3 Standard drinks or equivalent per week  . Drug use: No  . Sexual activity: Yes  Lifestyle  . Physical activity:    Days per week: Not on file    Minutes per session: Not on file  . Stress: Not on file  Relationships  . Social connections:    Talks on phone: Not on file    Gets together: Not on file    Attends religious service: Not on file    Active member of club or organization: Not on file    Attends meetings of clubs or organizations: Not on file    Relationship status: Not on file  Other Topics Concern  . Not on file  Social History Narrative   Lives with her husband and their son.    Family History  Problem Relation Age of Onset  . Hypertension Paternal Grandmother   . Diabetes Paternal Grandmother   . Hypertension Maternal Grandmother   . Sarcoidosis Mother   . Hypertension Mother   . Thyroid disease Maternal Aunt   . Mental illness Cousin      ROS Review of Systems See HPI Constitution: No fevers or chills No malaise No diaphoresis Skin: No rash or itching Eyes: no blurry vision, no double vision GU: no dysuria or hematuria Neuro: no dizziness, ++headaches  all others reviewed and negative   Objective: Vitals:   07/03/17 1649  BP: (!) 133/91  Pulse: (!) 101  Resp: 17  Temp: 99.3 F (37.4 C)  SpO2: 100%  Weight: 214 lb 3.2 oz (97.2 kg)    Physical Exam General: alert, oriented, in NAD Head:  normocephalic, atraumatic, + frontal sinus tenderness Eyes: EOM intact, no scleral icterus or conjunctival injection Ears: TM clear bilaterally Nose: mucosa nonerythematous, nonedematous Throat: no pharyngeal exudate or erythema Lymph: no posterior auricular, submental or cervical lymph adenopathy Heart: normal rate, normal sinus rhythm, no murmurs Lungs: clear to auscultation bilaterally, no wheezing Low back exam No SI joint pain, normal spine alignment, normal range of motion  Assessment and Plan Omaya  was seen today for sciatica.  Diagnoses and all orders for this visit:  Left sided sciatica- advised trial of gabapentin based on history, normal exam  Low dose to start -     gabapentin (NEURONTIN) 100 MG capsule; Take one capsule at bedtime then Increase by one capsule nightly up to 3 capsule  Sinus congestion- advised flonase and antihistamine without phenylephrine or dextromorphan -     fluticasone (FLONASE) 50 MCG/ACT nasal spray; Place 2 sprays into both nostrils daily.     Echo

## 2017-08-15 ENCOUNTER — Encounter: Payer: Self-pay | Admitting: Family Medicine

## 2017-08-15 ENCOUNTER — Other Ambulatory Visit: Payer: Self-pay | Admitting: Obstetrics and Gynecology

## 2017-08-15 ENCOUNTER — Ambulatory Visit (INDEPENDENT_AMBULATORY_CARE_PROVIDER_SITE_OTHER): Payer: 59 | Admitting: Family Medicine

## 2017-08-15 ENCOUNTER — Other Ambulatory Visit: Payer: Self-pay

## 2017-08-15 VITALS — BP 139/76 | HR 96 | Temp 98.7°F | Resp 17 | Ht 64.0 in | Wt 208.8 lb

## 2017-08-15 DIAGNOSIS — M25511 Pain in right shoulder: Secondary | ICD-10-CM

## 2017-08-15 DIAGNOSIS — Z130 Encounter for screening for diseases of the blood and blood-forming organs and certain disorders involving the immune mechanism: Secondary | ICD-10-CM

## 2017-08-15 DIAGNOSIS — Z1329 Encounter for screening for other suspected endocrine disorder: Secondary | ICD-10-CM | POA: Diagnosis not present

## 2017-08-15 DIAGNOSIS — Z0000 Encounter for general adult medical examination without abnormal findings: Secondary | ICD-10-CM

## 2017-08-15 DIAGNOSIS — E785 Hyperlipidemia, unspecified: Secondary | ICD-10-CM | POA: Diagnosis not present

## 2017-08-15 DIAGNOSIS — Z1231 Encounter for screening mammogram for malignant neoplasm of breast: Secondary | ICD-10-CM

## 2017-08-15 DIAGNOSIS — Z13228 Encounter for screening for other metabolic disorders: Secondary | ICD-10-CM

## 2017-08-15 MED ORDER — LISINOPRIL 10 MG PO TABS: 10.0000 mg | ORAL_TABLET | Freq: Every day | ORAL | 1 refills | Status: DC

## 2017-08-15 MED ORDER — MELOXICAM 15 MG PO TABS: 15.0000 mg | ORAL_TABLET | Freq: Every day | ORAL | 0 refills | Status: DC

## 2017-08-15 NOTE — Progress Notes (Signed)
6/28/20191:57 PM  [name] November 05, 1970, 47 y.o. female [id number]  Chief Complaint  Patient presents with  . Annual Exam    no pap  . Medication Refill    lipitor, lisinopril, meloxicam    HPI:   Patient is a 47 y.o. female with past medical history significant for HTN, ,HLP who presents today for CPE  Cervical Cancer Screening: 2017, supracervical hyst, sees Dr Raphael Gibney  Breast Cancer Screening: has appt in July Colorectal Cancer Screening: n/a however she has had colonoscopy in 2015 due to recurrent diverticulitis Bone Density Testing: n/a Seasonal Influenza Vaccination: at work Td/Tdap Vaccination: at work Pneumococcal Vaccination: at age 51 Zoster Vaccination: n/a Frequency of Dental evaluation: Q6 months Frequency of Eye evaluation: yearly  Patient has been working on her diet and losing weight. Wondering if she can stop cholesterol medication.  Fall Risk  08/15/2017 07/03/2017 02/05/2017  Falls in the past year? No No No     Depression screen [facility] 2/9 08/15/2017 07/03/2017 02/05/2017  Decreased Interest 0 0 0  Down, Depressed, Hopeless 0 0 0  PHQ - 2 Score 0 0 0    Allergies  Allergen Reactions  . Keflex [Cephalexin] Anaphylaxis  . Penicillins Anaphylaxis    Prior to Admission medications   Medication Sig Start Date End Date Taking? Authorizing Provider  atorvastatin (LIPITOR) 20 MG tablet Take 20 mg by mouth daily. 07/26/16  Yes [provider]  Black Cohosh (REMIFEMIN) 20 MG TABS Take 40 mg by mouth daily.   Yes [provider]  fluticasone (FLONASE) 50 MCG/ACT nasal spray Place 2 sprays into both nostrils daily. 07/03/17  Yes Stallings, Zoe A, MD  hydrocortisone cream 0.5 % Apply 1 application topically 2 (two) times daily as needed for itching.   Yes [provider]  lisinopril (PRINIVIL,ZESTRIL) 10 MG tablet Take 10 mg by mouth daily.   Yes [provider]  meloxicam (MOBIC) 15 MG tablet Take 1 tablet (15 mg  total) by mouth daily. 02/14/17  Yes Jeffery, Chelle, PA-C  Multiple Vitamins-Minerals (HAIR/SKIN/NAILS/BIOTIN PO) Take 1 tablet by mouth daily.   Yes [provider]  gabapentin (NEURONTIN) 100 MG capsule Take one capsule at bedtime then Increase by one capsule nightly up to 3 capsule 07/03/17   Forrest Moron, MD    Past Medical History:  Diagnosis Date  . Abnormal Pap smear 2000   [facility] Biopsy  . Allergy   . Back pain   . Breast fibroadenoma   . Diverticulitis of colon with perforation 05/20/2014  . GERD (gastroesophageal reflux disease)   . H/O metrorrhagia 12/2006  . H/O myomectomy 07-2006   robotic  . H/O sinusitis   . Headache(784.0)   . History of conization of cervix 2000  . History of ectopic pregnancy 2005   r salpyngectomy  . History of hysterectomy, supracervical 08-2008   robotic  . History of syphilis   . History of syphilis   . Hx of herpes simplex type 2 infection   . Hypertension   . Ileostomy present ([place]) 10/26/2014  . Intra-abdominal abscess ([place]) 07/21/2014  . PMS (premenstrual syndrome)     Past Surgical History:  Procedure Laterality Date  . ABDOMINAL HYSTERECTOMY    . BREAST LUMPECTOMY  10/11   rt-neg  . BREAST SURGERY  2004   fibroademoma  . CERVICAL CONE BIOPSY  2000  . COLON RESECTION N/A 07/06/2014   Procedure: LAPAROSCOPIC LOOP ILEOSTOMY WITH PLACEMENT OF PELVIC DRAIN;  Surgeon: Donnie Mesa, MD;  Location: [facility] OR;  Service: General;  Laterality: N/A;  . COLON SURGERY    . DILATION AND CURETTAGE OF UTERUS  2009  . DIVERTING ILEOSTOMY Right 06/2014  . ECTOPIC PREGNANCY SURGERY  2005   R salpyngectomy  . ILEOSTOMY N/A 10/26/2014   Procedure: LOOP ILEOSTOMY REVERSAL;  Surgeon: Donnie Mesa, MD;  Location: [place];  Service: General;  Laterality: N/A;  . LAPAROSCOPIC SIGMOID COLECTOMY N/A 06/30/2014   Procedure: LAPAROSCOPIC ASSISTED SIGMOID COLECTOMY;  Surgeon: Donnie Mesa, MD;  Location: [place];  Service: General;  Laterality: N/A;  .  MYOMECTOMY  2008   robotic  . POLYPECTOMY  2009  . ROBOTIC ASSISTED LAP VAGINAL HYSTERECTOMY  2010   supracervical  . SALPINGECTOMY  2005  . TRIGGER FINGER RELEASE Right 09/28/2012   Procedure: RELEASE TRIGGER FINGER/A-1 PULLEY RIGHT THUMB;  Surgeon: Jolyn Nap, MD;  Location: [place];  Service: Orthopedics;  Laterality: Right;    Social History   Tobacco Use  . Smoking status: Former Smoker    Packs/day: 0.25    Years: 15.00    Pack years: 3.75    Types: Cigarettes    Last attempt to quit: 06/12/2009    Years since quitting: 8.1  . Smokeless tobacco: Never Used  Substance Use Topics  . Alcohol use: No    Alcohol/week: 1.8 oz    Types: 3 Standard drinks or equivalent per week    Family History  Problem Relation Age of Onset  . Hypertension Paternal Grandmother   . Diabetes Paternal Grandmother   . Hypertension Maternal Grandmother   . Sarcoidosis Mother   . Hypertension Mother   . Thyroid disease Maternal Aunt   . Mental illness Cousin     Review of Systems  Constitutional: Negative for chills and fever.  HENT: Positive for congestion. Negative for ear pain, sinus pain and sore throat.   Eyes: Negative for blurred vision and double vision.  Respiratory: Negative for cough and shortness of breath.   Cardiovascular: Negative for chest pain, palpitations and leg swelling.  Gastrointestinal: Negative for abdominal pain, constipation, diarrhea, nausea and vomiting.  Genitourinary: Negative for dysuria and hematuria.  Musculoskeletal: Positive for joint pain. Negative for back pain.  Neurological: Positive for tingling.  Psychiatric/Behavioral: Negative for depression. The patient is not nervous/anxious.   All other systems reviewed and are negative.    OBJECTIVE:  Blood pressure 139/76, pulse 96, temperature 98.7 F (37.1 C), resp. rate 17, height 5\' 4"  (1.626 m), weight 208 lb 12.8 oz (94.7 kg), SpO2 98 %.  Wt Readings from Last 3  Encounters:  08/15/17 208 lb 12.8 oz (94.7 kg)  07/03/17 214 lb 3.2 oz (97.2 kg)  05/29/17 215 lb (97.5 kg)     Visual Acuity Screening   Right eye Left eye Both eyes  Without correction: 20/20 20/30 20/30   With correction:        Physical Exam  Constitutional: She is oriented to person, place, and time. She appears well-developed and well-nourished.  HENT:  Head: Normocephalic and atraumatic.  Right Ear: Hearing, tympanic membrane, external ear and ear canal normal.  Left Ear: Hearing, tympanic membrane, external ear and ear canal normal.  Mouth/Throat: Oropharynx is clear and moist.  Eyes: Pupils are equal, round, and reactive to light. Conjunctivae and EOM are normal.  Neck: Neck supple. No thyromegaly present.  Cardiovascular: Normal rate, regular rhythm, normal heart sounds and intact distal pulses. Exam reveals no gallop and no friction rub.  No murmur  heard. Pulmonary/Chest: Effort normal and breath sounds normal. She has no wheezes. She has no rales.  Abdominal: Soft. Bowel sounds are normal. She exhibits no distension and no mass. There is no tenderness.  Musculoskeletal: Normal range of motion. She exhibits no edema.  Lymphadenopathy:    She has no cervical adenopathy.  Neurological: She is alert and oriented to person, place, and time. She has normal reflexes. No cranial nerve deficit. Gait normal.  Skin: Skin is warm and dry.  Psychiatric: She has a normal mood and affect.  Nursing note and vitals reviewed.   ASSESSMENT and PLAN  1. Annual physical exam No concerns per history or exam. Routine HCM labs ordered. HCM reviewed/discussed. Anticipatory guidance regarding healthy weight, lifestyle and choices given.   2. Screening for deficiency anemia - CBC  3. Screening for thyroid disorder - TSH  4. Screening for metabolic disorder - Comprehensive metabolic panel  5. Dyslipidemia Will do trial off atorvastatin. Continue with LFM. Recheck in 6 months. - Lipid  panel - Lipid panel; Future  6. Acute pain of right shoulder - meloxicam (MOBIC) 15 MG tablet; Take 1 tablet (15 mg total) by mouth daily.  Other orders - lisinopril (PRINIVIL,ZESTRIL) 10 MG tablet; Take 1 tablet (10 mg total) by mouth daily.  Return in about 1 year (around 08/16/2018) for CPE.    Rutherford Guys, MD [facility] at [address] [place], [place] [address] Ph.  [phone] Fax [phone]

## 2017-08-15 NOTE — Patient Instructions (Signed)
Lets see what your cholesterol is a see if we can do a trail of decreased/off cholesterol medication  Preventive Care 40-64 Years, Female Preventive care refers to lifestyle choices and visits with your health care provider that can promote health and wellness. What does preventive care include?  A yearly physical exam. This is also called an annual well check.  Dental exams once or twice a year.  Routine eye exams. Ask your health care provider how often you should have your eyes checked.  Personal lifestyle choices, including: ? Daily care of your teeth and gums. ? Regular physical activity. ? Eating a healthy diet. ? Avoiding tobacco and drug use. ? Limiting alcohol use. ? Practicing safe sex. ? Taking low-dose aspirin daily starting at age 4. ? Taking vitamin and mineral supplements as recommended by your health care provider. What happens during an annual well check? The services and screenings done by your health care provider during your annual well check will depend on your age, overall health, lifestyle risk factors, and family history of disease. Counseling Your health care provider may ask you questions about your:  Alcohol use.  Tobacco use.  Drug use.  Emotional well-being.  Home and relationship well-being.  Sexual activity.  Eating habits.  Work and work Statistician.  Method of birth control.  Menstrual cycle.  Pregnancy history.  Screening You may have the following tests or measurements:  Height, weight, and BMI.  Blood pressure.  Lipid and cholesterol levels. These may be checked every 5 years, or more frequently if you are over 55 years old.  Skin check.  Lung cancer screening. You may have this screening every year starting at age 67 if you have a 30-pack-year history of smoking and currently smoke or have quit within the past 15 years.  Fecal occult blood test (FOBT) of the stool. You may have this test every year starting at age  41.  Flexible sigmoidoscopy or colonoscopy. You may have a sigmoidoscopy every 5 years or a colonoscopy every 10 years starting at age 57.  Hepatitis C blood test.  Hepatitis B blood test.  Sexually transmitted disease (STD) testing.  Diabetes screening. This is done by checking your blood sugar (glucose) after you have not eaten for a while (fasting). You may have this done every 1-3 years.  Mammogram. This may be done every 1-2 years. Talk to your health care provider about when you should start having regular mammograms. This may depend on whether you have a family history of breast cancer.  BRCA-related cancer screening. This may be done if you have a family history of breast, ovarian, tubal, or peritoneal cancers.  Pelvic exam and Pap test. This may be done every 3 years starting at age 60. Starting at age 97, this may be done every 5 years if you have a Pap test in combination with an HPV test.  Bone density scan. This is done to screen for osteoporosis. You may have this scan if you are at high risk for osteoporosis.  Discuss your test results, treatment options, and if necessary, the need for more tests with your health care provider. Vaccines Your health care provider may recommend certain vaccines, such as:  Influenza vaccine. This is recommended every year.  Tetanus, diphtheria, and acellular pertussis (Tdap, Td) vaccine. You may need a Td booster every 10 years.  Varicella vaccine. You may need this if you have not been vaccinated.  Zoster vaccine. You may need this after age 62.  Measles,  mumps, and rubella (MMR) vaccine. You may need at least one dose of MMR if you were born in 1957 or later. You may also need a second dose.  Pneumococcal 13-valent conjugate (PCV13) vaccine. You may need this if you have certain conditions and were not previously vaccinated.  Pneumococcal polysaccharide (PPSV23) vaccine. You may need one or two doses if you smoke cigarettes or if you  have certain conditions.  Meningococcal vaccine. You may need this if you have certain conditions.  Hepatitis A vaccine. You may need this if you have certain conditions or if you travel or work in places where you may be exposed to hepatitis A.  Hepatitis B vaccine. You may need this if you have certain conditions or if you travel or work in places where you may be exposed to hepatitis B.  Haemophilus influenzae type b (Hib) vaccine. You may need this if you have certain conditions.  Talk to your health care provider about which screenings and vaccines you need and how often you need them. This information is not intended to replace advice given to you by your health care provider. Make sure you discuss any questions you have with your health care provider. Document Released: 03/03/2015 Document Revised: 10/25/2015 Document Reviewed: 12/06/2014 Elsevier Interactive Patient Education  [employer].

## 2017-08-16 LAB — COMPREHENSIVE METABOLIC PANEL
ALT: 18 IU/L (ref 0–32)
AST: 19 IU/L (ref 0–40)
Albumin/Globulin Ratio: 1.6 (ref 1.2–2.2)
Albumin: 4.6 g/dL (ref 3.5–5.5)
Alkaline Phosphatase: 62 IU/L (ref 39–117)
BUN/Creatinine Ratio: 16 (ref 9–23)
BUN: 14 mg/dL (ref 6–24)
Bilirubin Total: 0.4 mg/dL (ref 0.0–1.2)
CO2: 25 mmol/L (ref 20–29)
Calcium: 10.2 mg/dL (ref 8.7–10.2)
Chloride: 104 mmol/L (ref 96–106)
Creatinine, Ser: 0.86 mg/dL (ref 0.57–1.00)
GFR calc Af Amer: 92 mL/min/{1.73_m2} (ref >59)
GFR calc non Af Amer: 80 mL/min/{1.73_m2} (ref >59)
Globulin, Total: 2.9 g/dL (ref 1.5–4.5)
Glucose: 75 mg/dL (ref 65–99)
Potassium: 4.5 mmol/L (ref 3.5–5.2)
Sodium: 143 mmol/L (ref 134–144)
Total Protein: 7.5 g/dL (ref 6.0–8.5)

## 2017-08-16 LAB — CBC
Hematocrit: 39.8 % (ref 34.0–46.6)
Hemoglobin: 13.1 g/dL (ref 11.1–15.9)
MCH: 29.5 pg (ref 26.6–33.0)
MCHC: 32.9 g/dL (ref 31.5–35.7)
MCV: 90 fL (ref 79–97)
Platelets: 353 10*3/uL (ref 150–450)
RBC: 4.44 x10E6/uL (ref 3.77–5.28)
RDW: 13.3 % (ref 12.3–15.4)
WBC: 8.7 10*3/uL (ref 3.4–10.8)

## 2017-08-16 LAB — LIPID PANEL
Chol/HDL Ratio: 2.8 ratio (ref 0.0–4.4)
Cholesterol, Total: 182 mg/dL (ref 100–199)
HDL: 66 mg/dL (ref >39)
LDL Calculated: 93 mg/dL (ref 0–99)
Triglycerides: 116 mg/dL (ref 0–149)
VLDL Cholesterol Cal: 23 mg/dL (ref 5–40)

## 2017-08-16 LAB — TSH: TSH: 0.999 u[IU]/mL (ref 0.450–4.500)

## 2017-08-22 MED FILL — LISINOPRIL 10 MG TABLET: 10 | 90 days supply | Qty: 90 | Fill #1

## 2017-08-22 MED FILL — ATORVASTATIN 20 MG TABLET: 20 | 90 days supply | Qty: 90 | Fill #1

## 2017-08-26 MED FILL — CLINDAMYCIN HCL 150 MG CAPS: 150 | 7 days supply | Qty: 21 | Fill #0

## 2017-08-26 MED FILL — ACETAMINOPHEN/COD #3 TABLET: 300-30 | 4 days supply | Qty: 16 | Fill #0

## 2017-09-12 ENCOUNTER — Ambulatory Visit: Payer: Self-pay

## 2017-09-16 ENCOUNTER — Encounter: Payer: Self-pay | Admitting: Physician Assistant

## 2017-09-16 ENCOUNTER — Ambulatory Visit (INDEPENDENT_AMBULATORY_CARE_PROVIDER_SITE_OTHER): Payer: 59 | Admitting: Physician Assistant

## 2017-09-16 VITALS — BP 152/86 | HR 91 | Temp 97.8°F | Resp 16 | Ht 64.5 in | Wt 216.2 lb

## 2017-09-16 DIAGNOSIS — M25552 Pain in left hip: Secondary | ICD-10-CM | POA: Diagnosis not present

## 2017-09-16 MED ORDER — NAPROXEN 500 MG PO TABS: 500.0000 mg | ORAL_TABLET | Freq: Two times a day (BID) | ORAL | 1 refills | Status: DC

## 2017-09-16 MED ORDER — TRAMADOL HCL 50 MG PO TABS: 50.0000 mg | ORAL_TABLET | Freq: Three times a day (TID) | ORAL | 0 refills | Status: DC | PRN

## 2017-09-16 MED ORDER — DICLOFENAC SODIUM 1 % TD GEL
4.0000 g | Freq: Four times a day (QID) | TRANSDERMAL | 1 refills | Status: DC
Start: 2017-09-16 — End: 2018-03-25

## 2017-09-16 NOTE — Patient Instructions (Addendum)
Naproxen: 500 mg twice daily for the next week. Naproxen is an NSAID. Do not use with any other otc pain medication other than tylenol/acetaminophen - so no aleve, ibuprofen, motrin, advil, etc.  Tramadol for pain at night. Please use this only as directed.  Diclofenac: Apply 4 g of 1% gel to affected area 4 times daily.  Make an appointment with your ortho.   When your pain improves, see below for exercises. Do not attempt these if you are still in acute pain.   Trochanteric Bursitis Rehab Ask your health care provider which exercises are safe for you. Do exercises exactly as told by your health care provider and adjust them as directed. It is normal to feel mild stretching, pulling, tightness, or discomfort as you do these exercises, but you should stop right away if you feel sudden pain or your pain gets worse.Do not begin these exercises until told by your health care provider. Stretching exercises These exercises warm up your muscles and joints and improve the movement and flexibility of your hip. These exercises also help to relieve pain and stiffness. Exercise A: Iliotibial band stretch  1. Lie on your side with your left / right leg in the top position. 2. Bend your left / right knee and grab your ankle. 3. Slowly bring your knee back so your thigh is behind your body. 4. Slowly lower your knee toward the floor until you feel a gentle stretch on the outside of your left / right thigh. If you do not feel a stretch and your knee will not fall farther, place the heel of your other foot on top of your outer knee and pull your thigh down farther. 5. Hold this position for __________ seconds. 6. Slowly return to the starting position. Repeat __________ times. Complete this exercise __________ times a day. Strengthening exercises These exercises build strength and endurance in your hip and pelvis. Endurance is the ability to use your muscles for a long time, even after they get  tired. Exercise B: Bridge ( hip extensors) 1. Lie on your back on a firm surface with your knees bent and your feet flat on the floor. 2. Tighten your buttocks muscles and lift your buttocks off the floor until your trunk is level with your thighs. You should feel the muscles working in your buttocks and the back of your thighs. If this exercise is too easy, try doing it with your arms crossed over your chest. 3. Hold this position for __________ seconds. 4. Slowly return to the starting position. 5. Let your muscles relax completely between repetitions. Repeat __________ times. Complete this exercise __________ times a day. Exercise C: Squats ( knee extensors and  quadriceps) 1. Stand in front of a table, with your feet and knees pointing straight ahead. You may rest your hands on the table for balance but not for support. 2. Slowly bend your knees and lower your hips like you are going to sit in a chair. ? Keep your weight over your heels, not over your toes. ? Keep your lower legs upright so they are parallel with the table legs. ? Do not let your hips go lower than your knees. ? Do not bend lower than told by your health care provider. ? If your hip pain increases, do not bend as low. 3. Hold this position for __________ seconds. 4. Slowly push with your legs to return to standing. Do not use your hands to pull yourself to standing. Repeat __________ times. Complete  this exercise __________ times a day. Exercise D: Hip hike 1. Stand sideways on a bottom step. Stand on your left / right leg with your other foot unsupported next to the step. You can hold onto the railing or wall if needed for balance. 2. Keeping your knees straight and your torso square, lift your left / right hip up toward the ceiling. 3. Hold this position for __________ seconds. 4. Slowly let your left / right hip lower toward the floor, past the starting position. Your foot should get closer to the floor. Do not lean  or bend your knees. Repeat __________ times. Complete this exercise __________ times a day. Exercise E: Single leg stand 1. Stand near a counter or door frame that you can hold onto for balance as needed. It is helpful to stand in front of a mirror for this exercise so you can watch your hip. 2. Squeeze your left / right buttock muscles then lift up your other foot. Do not let your left / right hip push out to the side. 3. Hold this position for __________ seconds. Repeat __________ times. Complete this exercise __________ times a day. This information is not intended to replace advice given to you by your health care provider. Make sure you discuss any questions you have with your health care provider. Document Released: 03/14/2004 Document Revised: 10/12/2015 Document Reviewed: 01/20/2015 Elsevier Interactive Patient Education  [employer].  Thank you for coming in today. I hope you feel we met your needs.  Feel free to call PCP if you have any questions or further requests.  Please consider signing up for MyChart if you do not already have it, as this is a great way to communicate with me.  Best,  Whitney McVey, PA-C  IF you received an x-ray today, you will receive an invoice from [facility]. Please contact [facility] at [phone] with questions or concerns regarding your invoice.   IF you received labwork today, you will receive an invoice from [facility]. Please contact [facility] at [phone] with questions or concerns regarding your invoice.   Our billing staff will not be able to assist you with questions regarding bills from these companies.  You will be contacted with the lab results as soon as they are available. The fastest way to get your results is to activate your My Chart account. Instructions are located on the last page of this paperwork. If you have not heard from [place] regarding the results in 2 weeks, please contact this office.

## 2017-09-16 NOTE — Progress Notes (Signed)
[name]  MRN: [id number] DOB: [birth year]  PCP: Seward Carol, MD  Subjective:  Pt is a pleasant 47 year old female who presents to clinic for left hip pain x 6 days. This is not a new problem for her. Last OV for this problem was 07/03/2017, Px for gabapentin for sciatica.    Pain starts from left low back and left hip and radiates down to right ankle.  Pain is worse with standing or walking all day. 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  She has taken motrin. Tylenol does not help.  Pain is keeping her up at night.  She has seen ortho in the past for this problem - Last steroid injection of left hip was about 1 year ago.  [facility], x-ray of left hip done at her last CPE last year.   Review of Systems  Musculoskeletal: Positive for arthralgias (left hip) and gait problem.  Neurological: Negative for weakness and numbness.    Patient Active Problem List   Diagnosis Date Noted  . Dyslipidemia 08/15/2017  . History of hysterectomy, supracervical 02/14/2017  . Trigger ring finger of right hand 08/07/2015  . Primary osteoarthritis of first carpometacarpal joint of right hand 08/07/2015  . Diverticulitis of sigmoid colon s/p colectomy 06/30/2014 07/26/2014  . Tachycardia 02/04/2014  . HTN (hypertension) 11/02/2013  . Former smoker 11/26/2008    Current Outpatient Medications on File Prior to Visit  Medication Sig Dispense Refill  . Black Cohosh (REMIFEMIN) 20 MG TABS Take 40 mg by mouth daily.    . fluticasone (FLONASE) 50 MCG/ACT nasal spray Place 2 sprays into both nostrils daily. 16 g 6  . hydrocortisone cream 0.5 % Apply 1 application topically 2 (two) times daily as needed for itching.    Marland Kitchen lisinopril (PRINIVIL,ZESTRIL) 10 MG tablet Take 1 tablet (10 mg total) by mouth daily. 90 tablet 1  . Multiple Vitamins-Minerals (HAIR/SKIN/NAILS/BIOTIN PO) Take 1 tablet by mouth daily.    Marland Kitchen gabapentin (NEURONTIN) 100 MG capsule  Take one capsule at bedtime then Increase by one capsule nightly up to 3 capsule (Patient not taking: Reported on 09/16/2017) 30 capsule 3  . meloxicam (MOBIC) 15 MG tablet Take 1 tablet (15 mg total) by mouth daily. (Patient not taking: Reported on 09/16/2017) 30 tablet 0   No current facility-administered medications on file prior to visit.     Allergies  Allergen Reactions  . Keflex [Cephalexin] Anaphylaxis  . Penicillins Anaphylaxis     Objective:  BP (!) 152/86   Pulse 91   Temp 97.8 F (36.6 C) (Oral)   Resp 16   Ht 5' 4.5" (1.638 m)   Wt 216 lb 3.2 oz (98.1 kg)   SpO2 100%   BMI 36.54 kg/m   Physical Exam  Constitutional: She is oriented to person, place, and time. No distress.  Musculoskeletal:       Right hip: She exhibits normal range of motion, normal strength, no tenderness and no bony tenderness.       Left hip: She exhibits tenderness. She exhibits normal range of motion, normal strength, no swelling and no crepitus.       Lumbar back: She exhibits normal range of motion, no tenderness and no bony tenderness.  TTP greater trochanter.   Neurological: She is alert and oriented to person, place, and time.  Negative SLR  Skin: Skin is warm and dry.  Psychiatric: Judgment normal.  Vitals reviewed.   Assessment and Plan :  1. Pain of left hip joint - Pt presents for acute on chronic left hip pain. Suspect greater trochanteric pain syndrome. Plan to treat with NSAID. Ultram for nighttime pain.  Advised pt to make appt with ortho for possible corticosteroid injection. She understands and agrees with plan.  - naproxen (NAPROSYN) 500 MG tablet; Take 1 tablet (500 mg total) by mouth 2 (two) times daily with a meal.  Dispense: 30 tablet; Refill: 1 - traMADol (ULTRAM) 50 MG tablet; Take 1 tablet (50 mg total) by mouth every 8 (eight) hours as needed.  Dispense: 30 tablet; Refill: 0 - diclofenac sodium (VOLTAREN) 1 % GEL; Apply 4 g topically 4 (four) times daily.  Dispense:  100 g; Refill: 1  Whitney Tyaire Odem, PA-C  [facility] [place] 09/16/2017 5:37 PM  Please note: Portions of this report may have been transcribed using dragon voice recognition software. Every effort was made to ensure accuracy; however, inadvertent computerized transcription errors may be present.

## 2017-10-03 ENCOUNTER — Ambulatory Visit
Admission: RE | Admit: 2017-10-03 | Discharge: 2017-10-03 | Disposition: A | Discharge status: Home / Self Care | Payer: 59 | Source: Ambulatory Visit | Attending: Obstetrics and Gynecology | Admitting: Obstetrics and Gynecology

## 2017-10-03 DIAGNOSIS — Z1231 Encounter for screening mammogram for malignant neoplasm of breast: Secondary | ICD-10-CM | POA: Diagnosis not present

## 2017-10-10 ENCOUNTER — Ambulatory Visit (HOSPITAL_COMMUNITY)
Admission: EM | Admit: 2017-10-10 | Discharge: 2017-10-10 | Disposition: A | Discharge status: Home / Self Care | Payer: 59 | Attending: Family Medicine | Admitting: Family Medicine

## 2017-10-10 ENCOUNTER — Encounter (HOSPITAL_COMMUNITY): Payer: Self-pay | Admitting: Emergency Medicine

## 2017-10-10 DIAGNOSIS — M25571 Pain in right ankle and joints of right foot: Secondary | ICD-10-CM

## 2017-10-10 MED ORDER — MELOXICAM 7.5 MG PO TABS: 7.5000 mg | ORAL_TABLET | Freq: Every day | ORAL | 0 refills | Status: DC

## 2017-10-10 NOTE — ED Provider Notes (Signed)
[facility]    CSN: [id number] Arrival date & time: 10/10/17  1443     History   Chief Complaint Chief Complaint  Patient presents with  . Ankle Pain    HPI [name] is a 47 y.o. female.   46 year old female comes in for 3-day history of right ankle pain.  States pain to the medial ankle started during work, denies any injury/trauma.  States pain is mostly inferior to the medial malleolus, with swelling to the area.  Pain can radiate up the calf during movement.  States felt slightly warm to touch yesterday, but without erythema.  Denies fever, chills, night sweats.  Pain is constant, worse with movement and weightbearing.  Has not taken anything for the symptoms.   Denies swelling of calf. Denies long travels, hormone use, history of blood clots.      Past Medical History:  Diagnosis Date  . Abnormal Pap smear 2000   [facility] Biopsy  . Allergy   . Back pain   . Breast fibroadenoma   . Diverticulitis of colon with perforation 05/20/2014  . GERD (gastroesophageal reflux disease)   . H/O metrorrhagia 12/2006  . H/O myomectomy 07-2006   robotic  . H/O sinusitis   . Headache(784.0)   . History of conization of cervix 2000  . History of ectopic pregnancy 2005   r salpyngectomy  . History of hysterectomy, supracervical 08-2008   robotic  . History of syphilis   . History of syphilis   . Hx of herpes simplex type 2 infection   . Hypertension   . Ileostomy present ([place]) 10/26/2014  . Intra-abdominal abscess ([place]) 07/21/2014  . PMS (premenstrual syndrome)     Patient Active Problem List   Diagnosis Date Noted  . Dyslipidemia 08/15/2017  . History of hysterectomy, supracervical 02/14/2017  . Trigger ring finger of right hand 08/07/2015  . Primary osteoarthritis of first carpometacarpal joint of right hand 08/07/2015  . Diverticulitis of sigmoid colon s/p colectomy 06/30/2014 07/26/2014  . Tachycardia 02/04/2014  . HTN (hypertension) 11/02/2013  . Former  smoker 11/26/2008    Past Surgical History:  Procedure Laterality Date  . ABDOMINAL HYSTERECTOMY    . BREAST LUMPECTOMY  10/11   rt-neg  . BREAST SURGERY  2004   fibroademoma  . CERVICAL CONE BIOPSY  2000  . COLON RESECTION N/A 07/06/2014   Procedure: LAPAROSCOPIC LOOP ILEOSTOMY WITH PLACEMENT OF PELVIC DRAIN;  Surgeon: Donnie Mesa, MD;  Location: [facility];  Service: General;  Laterality: N/A;  . COLON SURGERY    . DILATION AND CURETTAGE OF UTERUS  2009  . DIVERTING ILEOSTOMY Right 06/2014  . ECTOPIC PREGNANCY SURGERY  2005   R salpyngectomy  . ILEOSTOMY N/A 10/26/2014   Procedure: LOOP ILEOSTOMY REVERSAL;  Surgeon: Donnie Mesa, MD;  Location: [facility];  Service: General;  Laterality: N/A;  . LAPAROSCOPIC SIGMOID COLECTOMY N/A 06/30/2014   Procedure: LAPAROSCOPIC ASSISTED SIGMOID COLECTOMY;  Surgeon: Donnie Mesa, MD;  Location: [facility];  Service: General;  Laterality: N/A;  . MYOMECTOMY  2008   robotic  . POLYPECTOMY  2009  . ROBOTIC ASSISTED LAP VAGINAL HYSTERECTOMY  2010   supracervical  . SALPINGECTOMY  2005  . TRIGGER FINGER RELEASE Right 09/28/2012   Procedure: RELEASE TRIGGER FINGER/A-1 PULLEY RIGHT THUMB;  Surgeon: Jolyn Nap, MD;  Location: [facility];  Service: Orthopedics;  Laterality: Right;    OB History    Gravida  4   Para  2  Term  2   Preterm      AB  2   Living  2     SAB  0   TAB  1   Ectopic  1   Multiple  0   Live Births               Home Medications    Prior to Admission medications   Medication Sig Start Date End Date Taking? Authorizing Provider  atorvastatin (LIPITOR) 20 MG tablet Take 20 mg by mouth daily.   Yes [provider]  Black Cohosh (REMIFEMIN) 20 MG TABS Take 40 mg by mouth daily.   Yes [provider]  lisinopril (PRINIVIL,ZESTRIL) 10 MG tablet Take 1 tablet (10 mg total) by mouth daily. 08/15/17  Yes Rutherford Guys, MD  diclofenac sodium (VOLTAREN) 1 % GEL Apply 4 g topically 4  (four) times daily. 09/16/17   McVey, Gelene Mink, PA-C  fluticasone (FLONASE) 50 MCG/ACT nasal spray Place 2 sprays into both nostrils daily. 07/03/17   Forrest Moron, MD  gabapentin (NEURONTIN) 100 MG capsule Take one capsule at bedtime then Increase by one capsule nightly up to 3 capsule Patient not taking: Reported on 09/16/2017 07/03/17   Forrest Moron, MD  hydrocortisone cream 0.5 % Apply 1 application topically 2 (two) times daily as needed for itching.    [provider]  meloxicam (MOBIC) 7.5 MG tablet Take 1 tablet (7.5 mg total) by mouth daily. 10/10/17   Tasia Catchings, Amy V, PA-C  Multiple Vitamins-Minerals (HAIR/SKIN/NAILS/BIOTIN PO) Take 1 tablet by mouth daily.    [provider]    Family History Family History  Problem Relation Age of Onset  . Hypertension Paternal Grandmother   . Diabetes Paternal Grandmother   . Hypertension Maternal Grandmother   . Sarcoidosis Mother   . Hypertension Mother   . Thyroid disease Maternal Aunt   . Mental illness Cousin     Social History Social History   Tobacco Use  . Smoking status: Former Smoker    Packs/day: 0.25    Years: 15.00    Pack years: 3.75    Types: Cigarettes    Last attempt to quit: 06/12/2009    Years since quitting: 8.3  . Smokeless tobacco: Never Used  Substance Use Topics  . Alcohol use: No    Alcohol/week: 3.0 standard drinks    Types: 3 Standard drinks or equivalent per week  . Drug use: No     Allergies   Keflex [cephalexin] and Penicillins   Review of Systems Review of Systems  Reason unable to perform ROS: See HPI as above.     Physical Exam Triage Vital Signs ED Triage Vitals [10/10/17 1022]  Enc Vitals Group     BP (!) 169/107     Pulse Rate 94     Resp 16     Temp 98.1 F (36.7 C)     Temp src      SpO2 98 %     Weight      Height      Head Circumference      Peak Flow      Pain Score      Pain Loc      Pain Edu?      Excl. in [place]?    No data  found.  Updated Vital Signs BP (!) 169/107   Pulse 94   Temp 98.1 F (36.7 C)   Resp 16   SpO2 98%  Physical Exam  Constitutional: She is oriented to person, place, and time. She appears well-developed and well-nourished. No distress.  HENT:  Head: Normocephalic and atraumatic.  Eyes: Pupils are equal, round, and reactive to light. Conjunctivae are normal.  Musculoskeletal:  Mild swelling to the right medial ankle, inferior to the malleolus.  No erythema, warmth.  No swelling to the calf.  Tenderness to palpation of medial ankle, inferior to the malleolus.  No tenderness to palpation of the foot.  No tenderness to palpation of the calf.  Full range of motion.  Strength normal and equal bilaterally.  Sensation intact and equal bilaterally.  Pedal pulse 2+ and equal bilaterally.  Negative Homans.  Neurological: She is alert and oriented to person, place, and time.  Skin: She is not diaphoretic.     UC Treatments / Results  Labs (all labs ordered are listed, but only abnormal results are displayed) Labs Reviewed - No data to display  EKG None  Radiology No results found.  Procedures Procedures (including critical care time)  Medications Ordered in UC Medications - No data to display  Initial Impression / Assessment and Plan / UC Course  I have reviewed the triage vital signs and the nursing notes.  Pertinent labs & imaging results that were available during my care of the patient were reviewed by me and considered in my medical decision making (see chart for details).    Given no injury, bony tenderness, no x-ray indicated.  Will treat for inflammation with Mobic.  Ice compress, elevation, Ace wrap during activity.  Return precautions given.  Patient expresses understanding and agrees to plan.  Final Clinical Impressions(s) / UC Diagnoses   Final diagnoses:  Acute right ankle pain    ED Prescriptions    Medication Sig Dispense Auth. Provider   meloxicam (MOBIC)  7.5 MG tablet Take 1 tablet (7.5 mg total) by mouth daily. 15 tablet [name] Chad, [place] 10/10/17 1100

## 2017-10-10 NOTE — Discharge Instructions (Signed)
Start Mobic. Do not take ibuprofen (motrin/advil)/ naproxen (aleve) while on mobic. Ice compress, elevation, ace wrap during activity. This can take up to 3-4 weeks to completely resolve, but you should be feeling better each week. Follow up here or with PCP if symptoms worsen, changes for reevaluation. If experiencing worsening symptoms, swelling to the calf, redness, warmth, chest pain, shortness of breath, go to the emergency department for further evaluation needed.

## 2017-10-10 NOTE — ED Triage Notes (Signed)
Pt states on Wednesday she was at work and her R ankle felt fine, halfway through the day she noticed her R ankle started hurting. Pt c/o continuing pain in her R ankle since Wednesday. No injury.

## 2017-11-02 ENCOUNTER — Other Ambulatory Visit: Payer: Self-pay

## 2017-11-02 ENCOUNTER — Emergency Department (HOSPITAL_COMMUNITY)
Admission: EM | Admit: 2017-11-02 | Discharge: 2017-11-02 | Disposition: A | Discharge status: Home / Self Care | Payer: 59 | Attending: Emergency Medicine | Admitting: Emergency Medicine

## 2017-11-02 ENCOUNTER — Emergency Department (HOSPITAL_COMMUNITY): Payer: 59

## 2017-11-02 ENCOUNTER — Encounter (HOSPITAL_COMMUNITY): Payer: Self-pay | Admitting: Emergency Medicine

## 2017-11-02 DIAGNOSIS — Z79899 Other long term (current) drug therapy: Secondary | ICD-10-CM | POA: Insufficient documentation

## 2017-11-02 DIAGNOSIS — Z87891 Personal history of nicotine dependence: Secondary | ICD-10-CM | POA: Insufficient documentation

## 2017-11-02 DIAGNOSIS — R1032 Left lower quadrant pain: Secondary | ICD-10-CM | POA: Diagnosis not present

## 2017-11-02 DIAGNOSIS — I10 Essential (primary) hypertension: Secondary | ICD-10-CM | POA: Diagnosis not present

## 2017-11-02 DIAGNOSIS — K625 Hemorrhage of anus and rectum: Secondary | ICD-10-CM | POA: Diagnosis not present

## 2017-11-02 LAB — URINALYSIS, ROUTINE W REFLEX MICROSCOPIC
BILIRUBIN URINE: NEGATIVE
Glucose, UA: NEGATIVE mg/dL
HGB URINE DIPSTICK: NEGATIVE
Ketones, ur: NEGATIVE mg/dL
Leukocytes, UA: NEGATIVE
Nitrite: NEGATIVE
Protein, ur: NEGATIVE mg/dL
SPECIFIC GRAVITY, URINE: 1.028 (ref 1.005–1.030)
pH: 5 (ref 5.0–8.0)

## 2017-11-02 LAB — COMPREHENSIVE METABOLIC PANEL
ALBUMIN: 4.3 g/dL (ref 3.5–5.0)
ALK PHOS: 52 U/L (ref 38–126)
ALT: 31 U/L (ref 0–44)
ANION GAP: 12 (ref 5–15)
AST: 28 U/L (ref 15–41)
BUN: 11 mg/dL (ref 6–20)
CHLORIDE: 105 mmol/L (ref 98–111)
CO2: 22 mmol/L (ref 22–32)
Calcium: 9.8 mg/dL (ref 8.9–10.3)
Creatinine, Ser: 0.83 mg/dL (ref 0.44–1.00)
GFR calc non Af Amer: >60 mL/min (ref >60)
GLUCOSE: 83 mg/dL (ref 70–99)
POTASSIUM: 4 mmol/L (ref 3.5–5.1)
SODIUM: 139 mmol/L (ref 135–145)
Total Bilirubin: 0.5 mg/dL (ref 0.3–1.2)
Total Protein: 7.3 g/dL (ref 6.5–8.1)

## 2017-11-02 LAB — CBC
HCT: 43 % (ref 36.0–46.0)
HEMOGLOBIN: 13.9 g/dL (ref 12.0–15.0)
MCH: 29.5 pg (ref 26.0–34.0)
MCHC: 32.3 g/dL (ref 30.0–36.0)
MCV: 91.3 fL (ref 78.0–100.0)
Platelets: 336 10*3/uL (ref 150–400)
RBC: 4.71 MIL/uL (ref 3.87–5.11)
RDW: 12.8 % (ref 11.5–15.5)
WBC: 7.7 10*3/uL (ref 4.0–10.5)

## 2017-11-02 LAB — LIPASE, BLOOD: LIPASE: 38 U/L (ref 11–51)

## 2017-11-02 MED ORDER — MORPHINE SULFATE (PF) 4 MG/ML IV SOLN
4.0000 mg | Freq: Once | INTRAVENOUS | Status: AC
  Administered 2017-11-02: 4 mg via INTRAVENOUS
  Filled 2017-11-02: qty 1

## 2017-11-02 MED ORDER — IOHEXOL 300 MG/ML  SOLN
100.0000 mL | Freq: Once | INTRAMUSCULAR | Status: AC | PRN
  Administered 2017-11-02: 100 mL via INTRAVENOUS

## 2017-11-02 NOTE — ED Provider Notes (Signed)
[facility] EMERGENCY DEPARTMENT Provider Note   CSN: [id number] Arrival date & time: 11/02/17  1729     History   Chief Complaint Chief Complaint  Patient presents with  . Abdominal Pain    HPI [name] is a 47 y.o. female presented for evaluation of abdominal pain.  Patient reports left lower quadrant cramping for the past 3 days.  Patient states symptoms feel similar to previous diverticulosis infections.  Today she had an episode of bright red blood per rectum while wiping, no blood noted in her underwear at other times.  She denies fevers, chills, chest pain, shortness of breath, nausea, vomiting, upper abdominal pain, or urinary symptoms.  Patient reports increased hardness of stools and mild constipation.  Significant abdominal history including sigmoid resection and ileostomy placement with reversal.  Patient states she follows with Dr. Oletta Lamas from [facility] [place], although it has been about a year since he seen him.  Patient states she is likely due for colonoscopy.  She denies worsening symptoms with oral intake.  Denies worsening symptoms with relation or bowel movements.  She took 2 naproxen this morning for pain, has not tried anything else.  HPI  Past Medical History:  Diagnosis Date  . Abnormal Pap smear 2000   [facility] Biopsy  . Allergy   . Back pain   . Breast fibroadenoma   . Diverticulitis of colon with perforation 05/20/2014  . GERD (gastroesophageal reflux disease)   . H/O metrorrhagia 12/2006  . H/O myomectomy 07-2006   robotic  . H/O sinusitis   . Headache(784.0)   . History of conization of cervix 2000  . History of ectopic pregnancy 2005   r salpyngectomy  . History of hysterectomy, supracervical 08-2008   robotic  . History of syphilis   . History of syphilis   . Hx of herpes simplex type 2 infection   . Hypertension   . Ileostomy present ([facility]) 10/26/2014  . Intra-abdominal abscess ([place]) 07/21/2014  . PMS (premenstrual syndrome)      Patient Active Problem List   Diagnosis Date Noted  . Dyslipidemia 08/15/2017  . History of hysterectomy, supracervical 02/14/2017  . Trigger ring finger of right hand 08/07/2015  . Primary osteoarthritis of first carpometacarpal joint of right hand 08/07/2015  . Diverticulitis of sigmoid colon s/p colectomy 06/30/2014 07/26/2014  . Tachycardia 02/04/2014  . HTN (hypertension) 11/02/2013  . Former smoker 11/26/2008    Past Surgical History:  Procedure Laterality Date  . ABDOMINAL HYSTERECTOMY    . BREAST LUMPECTOMY  10/11   rt-neg  . BREAST SURGERY  2004   fibroademoma  . CERVICAL CONE BIOPSY  2000  . COLON RESECTION N/A 07/06/2014   Procedure: LAPAROSCOPIC LOOP ILEOSTOMY WITH PLACEMENT OF PELVIC DRAIN;  Surgeon: Donnie Mesa, MD;  Location: [facility];  Service: General;  Laterality: N/A;  . COLON SURGERY    . DILATION AND CURETTAGE OF UTERUS  2009  . DIVERTING ILEOSTOMY Right 06/2014  . ECTOPIC PREGNANCY SURGERY  2005   R salpyngectomy  . ILEOSTOMY N/A 10/26/2014   Procedure: LOOP ILEOSTOMY REVERSAL;  Surgeon: Donnie Mesa, MD;  Location: [facility];  Service: General;  Laterality: N/A;  . LAPAROSCOPIC SIGMOID COLECTOMY N/A 06/30/2014   Procedure: LAPAROSCOPIC ASSISTED SIGMOID COLECTOMY;  Surgeon: Donnie Mesa, MD;  Location: [facility];  Service: General;  Laterality: N/A;  . MYOMECTOMY  2008   robotic  . POLYPECTOMY  2009  . ROBOTIC ASSISTED LAP VAGINAL HYSTERECTOMY  2010   supracervical  .  SALPINGECTOMY  2005  . TRIGGER FINGER RELEASE Right 09/28/2012   Procedure: RELEASE TRIGGER FINGER/A-1 PULLEY RIGHT THUMB;  Surgeon: Jolyn Nap, MD;  Location: [facility];  Service: Orthopedics;  Laterality: Right;     OB History    Gravida  4   Para  2   Term  2   Preterm      AB  2   Living  2     SAB  0   TAB  1   Ectopic  1   Multiple  0   Live Births               Home Medications    Prior to Admission medications   Medication Sig Start  Date End Date Taking? Authorizing Provider  atorvastatin (LIPITOR) 20 MG tablet Take 20 mg by mouth daily.   Yes [provider]  Black Cohosh (REMIFEMIN) 20 MG TABS Take 40 mg by mouth daily.   Yes [provider]  diclofenac sodium (VOLTAREN) 1 % GEL Apply 4 g topically 4 (four) times daily. 09/16/17  Yes McVey, Gelene Mink, PA-C  lisinopril (PRINIVIL,ZESTRIL) 10 MG tablet Take 1 tablet (10 mg total) by mouth daily. 08/15/17  Yes Rutherford Guys, MD  Multiple Vitamins-Minerals (HAIR/SKIN/NAILS/BIOTIN PO) Take 1 tablet by mouth daily.   Yes [provider]  fluticasone (FLONASE) 50 MCG/ACT nasal spray Place 2 sprays into both nostrils daily. Patient not taking: Reported on 11/02/2017 07/03/17   Forrest Moron, MD  gabapentin (NEURONTIN) 100 MG capsule Take one capsule at bedtime then Increase by one capsule nightly up to 3 capsule Patient not taking: Reported on 09/16/2017 07/03/17   Forrest Moron, MD  meloxicam (MOBIC) 7.5 MG tablet Take 1 tablet (7.5 mg total) by mouth daily. Patient not taking: Reported on 11/02/2017 10/10/17   Arturo Morton    Family History Family History  Problem Relation Age of Onset  . Hypertension Paternal Grandmother   . Diabetes Paternal Grandmother   . Hypertension Maternal Grandmother   . Sarcoidosis Mother   . Hypertension Mother   . Thyroid disease Maternal Aunt   . Mental illness Cousin     Social History Social History   Tobacco Use  . Smoking status: Former Smoker    Packs/day: 0.25    Years: 15.00    Pack years: 3.75    Types: Cigarettes    Last attempt to quit: 06/12/2009    Years since quitting: 8.3  . Smokeless tobacco: Never Used  Substance Use Topics  . Alcohol use: Yes    Alcohol/week: 3.0 standard drinks    Types: 3 Standard drinks or equivalent per week  . Drug use: No     Allergies   Keflex [cephalexin] and Penicillins   Review of Systems Review of Systems  Gastrointestinal: Positive for  abdominal pain, blood in stool and constipation.  All other systems reviewed and are negative.    Physical Exam Updated Vital Signs BP 129/67   Pulse 78   Temp 98.5 F (36.9 C) (Oral)   Resp 16   Ht 5\' 4"  (1.626 m)   Wt 97.5 kg   SpO2 94%   BMI 36.90 kg/m   Physical Exam  Constitutional: She is oriented to person, place, and time. She appears well-developed and well-nourished. No distress.  Resting comfortably in the bed in no acute distress  HENT:  Head: Normocephalic and atraumatic.  Eyes: Pupils are equal, round, and reactive  to light. Conjunctivae and EOM are normal.  Neck: Normal range of motion. Neck supple.  Cardiovascular: Normal rate, regular rhythm and intact distal pulses.  Pulmonary/Chest: Effort normal and breath sounds normal. No respiratory distress. She has no wheezes.  Abdominal: Soft. She exhibits no distension and no mass. There is tenderness. There is no rebound and no guarding.  Tenderness palpation of left lower quadrant.  No rigidity, guarding, distention.  Negative rebound.  No signs of peritonitis.  Musculoskeletal: Normal range of motion.  Neurological: She is alert and oriented to person, place, and time.  Skin: Skin is warm and dry. Capillary refill takes less than 2 seconds.  Psychiatric: She has a normal mood and affect.  Nursing note and vitals reviewed.    ED Treatments / Results  Labs (all labs ordered are listed, but only abnormal results are displayed) Labs Reviewed  LIPASE, BLOOD  COMPREHENSIVE METABOLIC PANEL  CBC  URINALYSIS, ROUTINE W REFLEX MICROSCOPIC    EKG None  Radiology Ct Abdomen Pelvis W Contrast  Result Date: 11/02/2017 CLINICAL DATA:  Patient with left lower quadrant pain for 3 days. EXAM: CT ABDOMEN AND PELVIS WITH CONTRAST TECHNIQUE: Multidetector CT imaging of the abdomen and pelvis was performed using the standard protocol following bolus administration of intravenous contrast. CONTRAST:  145mL OMNIPAQUE IOHEXOL  300 MG/ML  SOLN COMPARISON:  CT abdomen pelvis 01/24/2017 FINDINGS: Lower chest: Normal heart size. Dependent ground-glass opacities within the lower lobes bilaterally compatible with atelectasis. Hepatobiliary: Liver is normal in size and contour. No focal lesion is identified. Gallbladder is unremarkable. No intrahepatic or extrahepatic biliary ductal dilatation. Pancreas: Unremarkable Spleen: Unremarkable Adrenals/Urinary Tract: Stable nodular thickening left adrenal gland. Right adrenal gland is normal. Kidneys enhance symmetrically with contrast. No hydronephrosis. Urinary bladder is unremarkable. Stomach/Bowel: Stable anastomosis involving the sigmoid colon. Descending colonic diverticulosis without evidence for acute diverticulitis. Stable small bowel anastomosis. No evidence for small bowel obstruction. Normal morphology of the stomach. Vascular/Lymphatic: Normal caliber abdominal aorta. Peripheral calcified atherosclerotic plaque. No retroperitoneal lymphadenopathy. Reproductive: Status post hysterectomy. Other: None. Musculoskeletal: Diastasis of the rectus abdominus musculature. Small fat containing hernia right lower anterior abdominal wall. Lumbar spine degenerative changes. No aggressive or acute appearing osseous lesions. IMPRESSION: 1. No acute process within the abdomen or pelvis. Stable postsurgical changes involving the sigmoid colon. Electronically Signed   By: Lovey Newcomer M.D.   On: 11/02/2017 21:30    Procedures Procedures (including critical care time)  Medications Ordered in ED Medications  morphine 4 MG/ML injection 4 mg (4 mg Intravenous Given 11/02/17 2032)  iohexol (OMNIPAQUE) 300 MG/ML solution 100 mL (100 mLs Intravenous Contrast Given 11/02/17 2104)     Initial Impression / Assessment and Plan / ED Course  I have reviewed the triage vital signs and the nursing notes.  Pertinent labs & imaging results that were available during my care of the patient were reviewed by me  and considered in my medical decision making (see chart for details).     Presenting for evaluation of left lower quadrant abdominal pain.  Physical exam reassuring, she is afebrile not tachycardic.  Appears nontoxic.  However, abdominal exam shows tenderness palpation of left lower quadrant, patient with significant abdominal history including diverticulitis, sigmoid colon resection, and ileostomy with reversal.  Also reporting increased symptoms of constipation.  Labs reassuring, no leukocytosis.  Kidney, liver, pancreatic function reassuring.  UA without infection.  Gust with patient.  Discussed low likelihood of intra-abdominal infection due to normal labs and vitals.  Patient  states her symptoms are the same as previous infections, would like a CT.  Due to patient's history, will obtain CT for further evaluation.   CT without acute infection.  No diverticulitis.  No obvious source of rectal bleeding.  Discussed with patient.  Discussed constipation could be a cause of her symptoms.  Discussed that she needs a colonoscopy/further evaluation by GI.  Discussed treatment with Tylenol, heating pads, and hydration.  Discussed treatment for constipation including hydration and Colace as needed.  At this time, patient appears safe for discharge.  Return precautions given.  Patient states she understands and agrees plan.   Final Clinical Impressions(s) / ED Diagnoses   Final diagnoses:  LLQ abdominal pain  BRBPR (bright red blood per rectum)    ED Discharge Orders    None       Franchot Heidelberg, PA-C 11/02/17 [place], [place], DO 11/02/17 2337

## 2017-11-02 NOTE — ED Triage Notes (Signed)
C/o LLQ cramping since Friday and bright red rectal bleeding today.  History of diverticulitis.   Denies nausea and vomiting.

## 2017-11-02 NOTE — Discharge Instructions (Addendum)
Taking home medication as prescribed. Use Tylenol as needed for pain. Call your stomach doctor tomorrow for follow-up for further evaluation. It is very important that you are not constipated.  Make sure you are staying well-hydrated with water.  If you are having hard stools, you may try Colace to help soften your stools. Return to the emergency room if you develop high fevers, persistent vomiting, severe worsening abdominal pain, abdominal tightness or distention, or any new or concerning symptoms.

## 2017-11-11 MED FILL — LISINOPRIL 10 MG TABLET: 10 | 90 days supply | Qty: 90 | Fill #2

## 2017-11-11 MED FILL — ATORVASTATIN CALCIUM 20 MG: 20 | 90 days supply | Qty: 90 | Fill #2

## 2017-12-09 ENCOUNTER — Telehealth: Payer: Self-pay | Admitting: Gastroenterology

## 2017-12-09 NOTE — Telephone Encounter (Signed)
Receive patient records from [facility], last colon was in 2015 is patient due for colonoscopy?

## 2017-12-31 ENCOUNTER — Telehealth: Payer: Self-pay | Admitting: Internal Medicine

## 2017-12-31 ENCOUNTER — Encounter: Payer: Self-pay | Admitting: Internal Medicine

## 2017-12-31 DIAGNOSIS — Z860101 Personal history of adenomatous and serrated colon polyps: Secondary | ICD-10-CM

## 2017-12-31 DIAGNOSIS — Z86010 Personal history of colonic polyps: Secondary | ICD-10-CM

## 2017-12-31 HISTORY — DX: Personal history of colonic polyps: Z86.010

## 2017-12-31 HISTORY — DX: Personal history of adenomatous and serrated colon polyps: Z86.0101

## 2017-12-31 NOTE — Telephone Encounter (Signed)
Diminutive cecal adenoma 2015 Dr. Oletta Lamas  Should have a recall colon 2020 (November)   Please explain to patient and place a recall for Dr. Loletha Carrow

## 2018-02-12 MED FILL — LISINOPRIL 10 MG TABS: 10 | 90 days supply | Qty: 90 | Fill #3

## 2018-02-12 MED FILL — ATORVASTATIN CALCIUM 20 MG: 20 | 90 days supply | Qty: 90 | Fill #3

## 2018-02-18 HISTORY — PX: POLYPECTOMY: SHX149

## 2018-02-20 DIAGNOSIS — H40013 Open angle with borderline findings, low risk, bilateral: Secondary | ICD-10-CM | POA: Diagnosis not present

## 2018-02-20 DIAGNOSIS — H11139 Conjunctival pigmentations, unspecified eye: Secondary | ICD-10-CM | POA: Diagnosis not present

## 2018-02-20 DIAGNOSIS — H5213 Myopia, bilateral: Secondary | ICD-10-CM | POA: Diagnosis not present

## 2018-02-20 DIAGNOSIS — H524 Presbyopia: Secondary | ICD-10-CM | POA: Diagnosis not present

## 2018-03-04 MED FILL — ATORVASTATIN CALCIUM 20 MG: 20 | 90 days supply | Qty: 90 | Fill #3

## 2018-03-04 MED FILL — LISINOPRIL 10 MG TABS: 10 | 90 days supply | Qty: 90 | Fill #3

## 2018-03-12 NOTE — Telephone Encounter (Signed)
See phone note from 10.22.2019. Dr.Mansouraty already reviewed records. Recall in system with Dr.Mansouraty.

## 2018-03-25 ENCOUNTER — Encounter: Payer: Self-pay | Admitting: Family Medicine

## 2018-03-25 ENCOUNTER — Other Ambulatory Visit: Payer: Self-pay

## 2018-03-25 ENCOUNTER — Ambulatory Visit (INDEPENDENT_AMBULATORY_CARE_PROVIDER_SITE_OTHER): Payer: 59

## 2018-03-25 ENCOUNTER — Ambulatory Visit (INDEPENDENT_AMBULATORY_CARE_PROVIDER_SITE_OTHER): Payer: 59 | Admitting: Family Medicine

## 2018-03-25 VITALS — BP 128/75 | HR 64 | Temp 98.5°F | Resp 14 | Ht 64.0 in | Wt 224.0 lb

## 2018-03-25 DIAGNOSIS — M779 Enthesopathy, unspecified: Secondary | ICD-10-CM

## 2018-03-25 DIAGNOSIS — M778 Other enthesopathies, not elsewhere classified: Secondary | ICD-10-CM

## 2018-03-25 DIAGNOSIS — M654 Radial styloid tenosynovitis [de Quervain]: Secondary | ICD-10-CM | POA: Diagnosis not present

## 2018-03-25 DIAGNOSIS — M79641 Pain in right hand: Secondary | ICD-10-CM | POA: Insufficient documentation

## 2018-03-25 MED ORDER — IBUPROFEN 800 MG PO TABS: 800.0000 mg | ORAL_TABLET | Freq: Three times a day (TID) | ORAL | 0 refills | Status: DC | PRN

## 2018-03-25 NOTE — Patient Instructions (Addendum)
I suspect your symptoms are due to overuse of the right thumb, possibly worsened from use last weekend.  Occasional numbness/tingling may also be a component of overuse.  Pain on the top of thumb is more from [place], again a potential overuse syndrome.  Try the thumb brace persistently for the next 4 to 5 days, with range of motion of wrist and thumb 2-3 times per day out of the brace during that time.  If symptoms are improving, can wear the brace just as needed, including after work or during sleep.  If your symptoms are not improving in the next 1 to 2 weeks of the mass, I would recommend follow-up with hand specialist and am happy to refer you.  Tylenol or the ibuprofen if needed intermittently, but try to avoid frequent use of ibuprofen if possible.   Tendinitis  Tendinitis is inflammation of a tendon. A tendon is a strong cord of tissue that connects muscle to bone. Tendinitis can affect any tendon, but it most commonly affects the:  Shoulder tendon (rotator cuff).  Ankle tendon (Achilles tendon).  Elbow tendon (triceps tendon).  Tendons in the wrist. What are the causes? This condition may be caused by:  Overusing a tendon or muscle. This is common.  Age-related wear and tear.  Injury.  Inflammatory conditions, such as arthritis.  Certain medicines. What increases the risk? You are more likely to develop this condition if you do activities that involve the same movements over and over again (repetitive motions). What are the signs or symptoms? Symptoms of this condition may include:  Pain.  Tenderness.  Mild swelling.  Decreased range of motion. How is this diagnosed? This condition is diagnosed with a physical exam. You may also have tests, such as:  Ultrasound. This uses sound waves to make an image of the inside of your body in the affected area.  MRI. How is this treated? This condition may be treated by resting, icing, applying pressure  (compression), and raising (elevating) the affected area above the level of your heart. This is known as RICE therapy. Treatment may also include:  Medicines to help reduce inflammation or to help reduce pain.  Exercises or physical therapy to strengthen and stretch the tendon.  A brace or splint.  Surgery. This is rarely needed. Follow these instructions at home: If you have a splint or brace:  Wear the splint or brace as told by your health care provider. Remove it only as told by your health care provider.  Loosen the splint or brace if your fingers or toes tingle, become numb, or turn cold and blue.  Keep the splint or brace clean.  If the splint or brace is not waterproof: ? Do not let it get wet. ? Cover it with a watertight covering when you take a bath or shower. Managing pain, stiffness, and swelling  If directed, put ice on the affected area. ? If you have a removable splint or brace, remove it as told by your health care provider. ? Put ice in a plastic bag. ? Place a towel between your skin and the bag. ? Leave the ice on for 20 minutes, 2-3 times a day.  Move the fingers or toes of the affected limb often, if this applies. This can help to prevent stiffness and lessen swelling.  If directed, raise (elevate) the affected area above the level of your heart while you are sitting or lying down.  If directed, apply heat to the affected area  before you exercise. Use the heat source that your health care provider recommends, such as a moist heat pack or a heating pad.     ? Place a towel between your skin and the heat source. ? Leave the heat on for 20-30 minutes. ? Remove the heat if your skin turns bright red. This is especially important if you are unable to feel pain, heat, or cold. You may have a greater risk of getting burned. Driving  Do not drive or use heavy machinery while taking prescription pain medicine.  Ask your health care provider when it is safe to  drive if you have a splint or brace on any part of your arm or leg. Activity  Rest the affected area as told by your health care provider.  Return to your normal activities as told by your health care provider. Ask your health care provider what activities are safe for you.  Avoid using the affected area while you are experiencing symptoms of tendinitis.  Do exercises as told by your health care provider. General instructions  If you have a splint, do not put pressure on any part of the splint until it is fully hardened. This may take several hours.  Wear an elastic bandage or compression wrap only as told by your health care provider.  Take over-the-counter and prescription medicines only as told by your health care provider.  Keep all follow-up visits as told by your health care provider. This is important. Contact a health care provider if:  Your symptoms do not improve.  You develop new, unexplained problems, such as numbness in your hands. Summary  Tendinitis is inflammation of a tendon.  You are more likely to develop this condition if you do activities that involve the same movements over and over again.  This condition may be treated by resting, icing, applying pressure (compression), and elevating the area above the level of your heart. This is known as RICE therapy.  Avoid using the affected area while you are experiencing symptoms of tendinitis. This information is not intended to replace advice given to you by your health care provider. Make sure you discuss any questions you have with your health care provider. Document Released: 02/02/2000 Document Revised: 06/25/2017 Document Reviewed: 06/25/2017 Elsevier Interactive Patient Education  [employer].     If you have lab work done today you will be contacted with your lab results within the next 2 weeks.  If you have not heard from [place] then please contact us. The fastest way to get your results is to register  for My Chart.   IF you received an x-ray today, you will receive an invoice from [facility]. Please contact [facility] at [phone] with questions or concerns regarding your invoice.   IF you received labwork today, you will receive an invoice from [place]. Please contact [facility] at [phone] with questions or concerns regarding your invoice.   Our billing staff will not be able to assist you with questions regarding bills from these companies.  You will be contacted with the lab results as soon as they are available. The fastest way to get your results is to activate your My Chart account. Instructions are located on the last page of this paperwork. If you have not heard from [place] regarding the results in 2 weeks, please contact this office.

## 2018-03-25 NOTE — Progress Notes (Signed)
Subjective:    Patient ID: [name], female    DOB: [birth year], 48 y.o.   MRN: [id number]  HPI Lorah Pate-Avants is a 48 y.o. female Presents today for: Chief Complaint  Patient presents with  . Hand Pain    right hand in the thumb/palm area. numbness on/off x1 wk. Have had surgery on the right hand 2014   R hand issues: Occasional numbness/pins and needles into all fingers for 6-7 months, occaionall into both sides of lower forearm.  No weakness. Uncomfortable to grip at times, but has function. No triggering/locking. Old brace from trigger thumb surgery, hasn't used recently, but has used in past after work.  Now with some pain in thenar eminence past 6 days.  Prepping more/peeling home made hashbrowns this past weekend. Worse after this activity. Feels swollen in affected area.   Uses stand mixer for cake preparation.   Tx: ice, ibuprofen (800mg ) last night with food. Min change.   Trigger thumb surgery/release 09/2012. Dr. Pilar Jarvis.    Patient Active Problem List   Diagnosis Date Noted  . Dyslipidemia 08/15/2017  . History of hysterectomy, supracervical 02/14/2017  . Trigger ring finger of right hand 08/07/2015  . Primary osteoarthritis of first carpometacarpal joint of right hand 08/07/2015  . Diverticulitis of sigmoid colon s/p colectomy 06/30/2014 07/26/2014  . Tachycardia 02/04/2014  . Hx of adenomatous polyp of colon 12/23/2013  . HTN (hypertension) 11/02/2013  . Former smoker 11/26/2008   Past Medical History:  Diagnosis Date  . Abnormal Pap smear 2000   [facility]  . Allergy   . Back pain   . Breast fibroadenoma   . Diverticulitis of colon with perforation 05/20/2014  . GERD (gastroesophageal reflux disease)   . H/O metrorrhagia 12/2006  . H/O myomectomy 07-2006   robotic  . H/O sinusitis   . Headache(784.0)   . History of conization of cervix 2000  . History of ectopic pregnancy 2005   r salpyngectomy  . History of hysterectomy,  supracervical 08-2008   robotic  . History of syphilis   . History of syphilis   . Hx of adenomatous polyp of colon 12/31/2017  . Hx of herpes simplex type 2 infection   . Hypertension   . Ileostomy present ([place]) 10/26/2014  . Intra-abdominal abscess ([place]) 07/21/2014  . PMS (premenstrual syndrome)    Past Surgical History:  Procedure Laterality Date  . ABDOMINAL HYSTERECTOMY    . BREAST LUMPECTOMY  10/11   rt-neg  . BREAST SURGERY  2004   fibroademoma  . CERVICAL CONE BIOPSY  2000  . COLON RESECTION N/A 07/06/2014   Procedure: LAPAROSCOPIC LOOP ILEOSTOMY WITH PLACEMENT OF PELVIC DRAIN;  Surgeon: Donnie Mesa, MD;  Location: [place];  Service: General;  Laterality: N/A;  . COLON SURGERY    . DILATION AND CURETTAGE OF UTERUS  2009  . DIVERTING ILEOSTOMY Right 06/2014  . ECTOPIC PREGNANCY SURGERY  2005   R salpyngectomy  . ILEOSTOMY N/A 10/26/2014   Procedure: LOOP ILEOSTOMY REVERSAL;  Surgeon: Donnie Mesa, MD;  Location: [place];  Service: General;  Laterality: N/A;  . LAPAROSCOPIC SIGMOID COLECTOMY N/A 06/30/2014   Procedure: LAPAROSCOPIC ASSISTED SIGMOID COLECTOMY;  Surgeon: Donnie Mesa, MD;  Location: [place];  Service: General;  Laterality: N/A;  . MYOMECTOMY  2008   robotic  . POLYPECTOMY  2009  . ROBOTIC ASSISTED LAP VAGINAL HYSTERECTOMY  2010   supracervical  . SALPINGECTOMY  2005  . TRIGGER FINGER RELEASE Right 09/28/2012   Procedure:  RELEASE TRIGGER FINGER/A-1 PULLEY RIGHT THUMB;  Surgeon: Jolyn Nap, MD;  Location: [place];  Service: Orthopedics;  Laterality: Right;   Allergies  Allergen Reactions  . Keflex [Cephalexin] Anaphylaxis  . Penicillins Anaphylaxis    Has patient had a PCN reaction causing immediate rash, facial/tongue/throat swelling, SOB or lightheadedness with hypotension: Yes Has patient had a PCN reaction causing severe rash involving mucus membranes or skin necrosis: No Has patient had a PCN reaction that required hospitalization:  No Has patient had a PCN reaction occurring within the last 10 years: No If all of the above answers are "NO", then may proceed with Cephalosporin use.    Prior to Admission medications   Medication Sig Start Date End Date Taking? Authorizing Provider  atorvastatin (LIPITOR) 20 MG tablet Take 20 mg by mouth daily.   Yes [provider]  Black Cohosh (REMIFEMIN) 20 MG TABS Take 40 mg by mouth daily.   Yes [provider]  lisinopril (PRINIVIL,ZESTRIL) 10 MG tablet Take 1 tablet (10 mg total) by mouth daily. 08/15/17  Yes Rutherford Guys, MD  Multiple Vitamins-Minerals (HAIR/SKIN/NAILS/BIOTIN PO) Take 1 tablet by mouth daily.   Yes [provider]   Social History   Socioeconomic History  . Marital status: Married    Spouse name: [name]  . Number of children: 1  . Years of education: Not on file  . Highest education level: Associate degree: occupational, Hotel manager, or vocational program  Occupational History  . Occupation: CMA    Comment: Primary Care at [employer]  . Financial resource strain: Not on file  . Food insecurity:    Worry: Not on file    Inability: Not on file  . Transportation needs:    Medical: Not on file    Non-medical: Not on file  Tobacco Use  . Smoking status: Former Smoker    Packs/day: 0.25    Years: 15.00    Pack years: 3.75    Types: Cigarettes    Last attempt to quit: 06/12/2009    Years since quitting: 8.7  . Smokeless tobacco: Never Used  Substance and Sexual Activity  . Alcohol use: Yes    Alcohol/week: 3.0 standard drinks    Types: 3 Standard drinks or equivalent per week  . Drug use: No  . Sexual activity: Yes  Lifestyle  . Physical activity:    Days per week: Not on file    Minutes per session: Not on file  . Stress: Not on file  Relationships  . Social connections:    Talks on phone: Not on file    Gets together: Not on file    Attends religious service: Not on file    Active member of club or  organization: Not on file    Attends meetings of clubs or organizations: Not on file    Relationship status: Not on file  . Intimate partner violence:    Fear of current or ex partner: Not on file    Emotionally abused: Not on file    Physically abused: Not on file    Forced sexual activity: Not on file  Other Topics Concern  . Not on file  Social History Narrative   Lives with her husband and their son.    Review of Systems Per HPI.     Objective:   Physical Exam Constitutional:      General: She is not in acute distress.    Appearance: She is well-developed.  HENT:     Head: Normocephalic and atraumatic.  Cardiovascular:     Rate and Rhythm: Normal rate.  Pulmonary:     Effort: Pulmonary effort is normal.  Musculoskeletal:     Right hand: She exhibits tenderness (Right thenar eminence with slight soft tissue swelling.  No erythema.  No induration.). She exhibits normal range of motion, no bony tenderness (Thumb IP and MCP nontender, intact range of motion.) and normal capillary refill (Neurovascular intact distally to all fingertips including thumb). Normal sensation noted. Normal strength noted.       Hands:  Neurological:     Mental Status: She is alert and oriented to person, place, and time.    Vitals:   03/25/18 1535  BP: 128/75  Pulse: 64  Resp: 14  Temp: 98.5 F (36.9 C)  TempSrc: Oral  SpO2: 99%  Weight: 224 lb (101.6 kg)  Height: 5\' 4"  (1.626 m)    Dg Hand Complete Right  Result Date: 03/25/2018 CLINICAL DATA:  Right hand pain. EXAM: RIGHT HAND - COMPLETE 3+ VIEW COMPARISON:  None. FINDINGS: There is no evidence of fracture or dislocation. There is no evidence of arthropathy or other focal bone abnormality. Soft tissues are unremarkable. IMPRESSION: Negative. Electronically Signed   By: Marijo Conception, M.D.   On: 03/25/2018 16:02       Assessment & Plan:    Jakeria Pate-Iddings is a 48 y.o. female Hand pain, right - Plan: DG Hand Complete Right,  Thumb spica, ibuprofen (ADVIL,MOTRIN) 800 MG tablet  De Quervain's tenosynovitis, right - Plan: Thumb spica  Thumb tendonitis - Plan: Thumb spica  Suspect primarily tendinosis of the thumb, with some component of de Quervain's.  Likely flared from recent overuse.  -Thumb spica splint, intermittent ibuprofen if needed but potential risks and side effects were discussed, use only as needed.  -Range of motion out of splint few times per day, consider hand specialist eval if not improving.  Meds ordered this encounter  Medications  . ibuprofen (ADVIL,MOTRIN) 800 MG tablet    Sig: Take 1 tablet (800 mg total) by mouth every 8 (eight) hours as needed.    Dispense:  30 tablet    Refill:  0   Patient Instructions   I suspect your symptoms are due to overuse of the right thumb, possibly worsened from use last weekend.  Occasional numbness/tingling may also be a component of overuse.  Pain on the top of thumb is more from [place], again a potential overuse syndrome.  Try the thumb brace persistently for the next 4 to 5 days, with range of motion of wrist and thumb 2-3 times per day out of the brace during that time.  If symptoms are improving, can wear the brace just as needed, including after work or during sleep.  If your symptoms are not improving in the next 1 to 2 weeks of the mass, I would recommend follow-up with hand specialist and am happy to refer you.  Tylenol or the ibuprofen if needed intermittently, but try to avoid frequent use of ibuprofen if possible.   Tendinitis  Tendinitis is inflammation of a tendon. A tendon is a strong cord of tissue that connects muscle to bone. Tendinitis can affect any tendon, but it most commonly affects the:  Shoulder tendon (rotator cuff).  Ankle tendon (Achilles tendon).  Elbow tendon (triceps tendon).  Tendons in the wrist. What are the causes? This condition may be caused by:  Overusing a tendon or muscle. This  is  common.  Age-related wear and tear.  Injury.  Inflammatory conditions, such as arthritis.  Certain medicines. What increases the risk? You are more likely to develop this condition if you do activities that involve the same movements over and over again (repetitive motions). What are the signs or symptoms? Symptoms of this condition may include:  Pain.  Tenderness.  Mild swelling.  Decreased range of motion. How is this diagnosed? This condition is diagnosed with a physical exam. You may also have tests, such as:  Ultrasound. This uses sound waves to make an image of the inside of your body in the affected area.  MRI. How is this treated? This condition may be treated by resting, icing, applying pressure (compression), and raising (elevating) the affected area above the level of your heart. This is known as RICE therapy. Treatment may also include:  Medicines to help reduce inflammation or to help reduce pain.  Exercises or physical therapy to strengthen and stretch the tendon.  A brace or splint.  Surgery. This is rarely needed. Follow these instructions at home: If you have a splint or brace:  Wear the splint or brace as told by your health care provider. Remove it only as told by your health care provider.  Loosen the splint or brace if your fingers or toes tingle, become numb, or turn cold and blue.  Keep the splint or brace clean.  If the splint or brace is not waterproof: ? Do not let it get wet. ? Cover it with a watertight covering when you take a bath or shower. Managing pain, stiffness, and swelling  If directed, put ice on the affected area. ? If you have a removable splint or brace, remove it as told by your health care provider. ? Put ice in a plastic bag. ? Place a towel between your skin and the bag. ? Leave the ice on for 20 minutes, 2-3 times a day.  Move the fingers or toes of the affected limb often, if this applies. This can help to prevent  stiffness and lessen swelling.  If directed, raise (elevate) the affected area above the level of your heart while you are sitting or lying down.  If directed, apply heat to the affected area before you exercise. Use the heat source that your health care provider recommends, such as a moist heat pack or a heating pad.     ? Place a towel between your skin and the heat source. ? Leave the heat on for 20-30 minutes. ? Remove the heat if your skin turns bright red. This is especially important if you are unable to feel pain, heat, or cold. You may have a greater risk of getting burned. Driving  Do not drive or use heavy machinery while taking prescription pain medicine.  Ask your health care provider when it is safe to drive if you have a splint or brace on any part of your arm or leg. Activity  Rest the affected area as told by your health care provider.  Return to your normal activities as told by your health care provider. Ask your health care provider what activities are safe for you.  Avoid using the affected area while you are experiencing symptoms of tendinitis.  Do exercises as told by your health care provider. General instructions  If you have a splint, do not put pressure on any part of the splint until it is fully hardened. This may take several hours.  Wear an elastic bandage  or compression wrap only as told by your health care provider.  Take over-the-counter and prescription medicines only as told by your health care provider.  Keep all follow-up visits as told by your health care provider. This is important. Contact a health care provider if:  Your symptoms do not improve.  You develop new, unexplained problems, such as numbness in your hands. Summary  Tendinitis is inflammation of a tendon.  You are more likely to develop this condition if you do activities that involve the same movements over and over again.  This condition may be treated by resting, icing,  applying pressure (compression), and elevating the area above the level of your heart. This is known as RICE therapy.  Avoid using the affected area while you are experiencing symptoms of tendinitis. This information is not intended to replace advice given to you by your health care provider. Make sure you discuss any questions you have with your health care provider. Document Released: 02/02/2000 Document Revised: 06/25/2017 Document Reviewed: 06/25/2017 Elsevier Interactive Patient Education  [employer].     If you have lab work done today you will be contacted with your lab results within the next 2 weeks.  If you have not heard from [place] then please contact us. The fastest way to get your results is to register for My Chart.   IF you received an x-ray today, you will receive an invoice from [facility]. Please contact [facility] at [phone] with questions or concerns regarding your invoice.   IF you received labwork today, you will receive an invoice from [place]. Please contact [facility] at [phone] with questions or concerns regarding your invoice.   Our billing staff will not be able to assist you with questions regarding bills from these companies.  You will be contacted with the lab results as soon as they are available. The fastest way to get your results is to activate your My Chart account. Instructions are located on the last page of this paperwork. If you have not heard from [place] regarding the results in 2 weeks, please contact this office.       Signed,   Merri Ray, MD [facility] [place].  03/28/18 10:24 PM

## 2018-03-27 ENCOUNTER — Observation Stay (HOSPITAL_COMMUNITY)
Admission: EM | Admit: 2018-03-27 | Discharge: 2018-03-28 | Disposition: A | Discharge status: Home / Self Care | Payer: 59 | Attending: Internal Medicine | Admitting: Internal Medicine

## 2018-03-27 ENCOUNTER — Emergency Department (HOSPITAL_COMMUNITY): Payer: 59

## 2018-03-27 ENCOUNTER — Encounter: Payer: Self-pay | Admitting: Emergency Medicine

## 2018-03-27 ENCOUNTER — Ambulatory Visit (INDEPENDENT_AMBULATORY_CARE_PROVIDER_SITE_OTHER): Payer: 59 | Admitting: Emergency Medicine

## 2018-03-27 ENCOUNTER — Other Ambulatory Visit: Payer: Self-pay

## 2018-03-27 ENCOUNTER — Encounter (HOSPITAL_COMMUNITY): Payer: Self-pay | Admitting: Emergency Medicine

## 2018-03-27 ENCOUNTER — Encounter (HOSPITAL_COMMUNITY): Payer: Self-pay | Admitting: Family Medicine

## 2018-03-27 VITALS — BP 137/83 | HR 99 | Temp 98.2°F

## 2018-03-27 DIAGNOSIS — K5730 Diverticulosis of large intestine without perforation or abscess without bleeding: Secondary | ICD-10-CM | POA: Diagnosis not present

## 2018-03-27 DIAGNOSIS — R0789 Other chest pain: Secondary | ICD-10-CM | POA: Diagnosis not present

## 2018-03-27 DIAGNOSIS — Z6838 Body mass index (BMI) 38.0-38.9, adult: Secondary | ICD-10-CM | POA: Diagnosis present

## 2018-03-27 DIAGNOSIS — R079 Chest pain, unspecified: Secondary | ICD-10-CM | POA: Diagnosis not present

## 2018-03-27 DIAGNOSIS — Z8249 Family history of ischemic heart disease and other diseases of the circulatory system: Secondary | ICD-10-CM | POA: Insufficient documentation

## 2018-03-27 DIAGNOSIS — M79641 Pain in right hand: Secondary | ICD-10-CM

## 2018-03-27 DIAGNOSIS — Z932 Ileostomy status: Secondary | ICD-10-CM | POA: Insufficient documentation

## 2018-03-27 DIAGNOSIS — Z90710 Acquired absence of both cervix and uterus: Secondary | ICD-10-CM | POA: Insufficient documentation

## 2018-03-27 DIAGNOSIS — Z9049 Acquired absence of other specified parts of digestive tract: Secondary | ICD-10-CM | POA: Diagnosis not present

## 2018-03-27 DIAGNOSIS — Z86010 Personal history of colonic polyps: Secondary | ICD-10-CM | POA: Insufficient documentation

## 2018-03-27 DIAGNOSIS — R9431 Abnormal electrocardiogram [ECG] [EKG]: Secondary | ICD-10-CM | POA: Insufficient documentation

## 2018-03-27 DIAGNOSIS — Z79899 Other long term (current) drug therapy: Secondary | ICD-10-CM | POA: Insufficient documentation

## 2018-03-27 DIAGNOSIS — E6609 Other obesity due to excess calories: Secondary | ICD-10-CM | POA: Diagnosis present

## 2018-03-27 DIAGNOSIS — E785 Hyperlipidemia, unspecified: Secondary | ICD-10-CM | POA: Diagnosis present

## 2018-03-27 DIAGNOSIS — I10 Essential (primary) hypertension: Secondary | ICD-10-CM | POA: Diagnosis not present

## 2018-03-27 DIAGNOSIS — Z87891 Personal history of nicotine dependence: Secondary | ICD-10-CM | POA: Diagnosis not present

## 2018-03-27 DIAGNOSIS — E669 Obesity, unspecified: Secondary | ICD-10-CM | POA: Diagnosis present

## 2018-03-27 DIAGNOSIS — K219 Gastro-esophageal reflux disease without esophagitis: Secondary | ICD-10-CM | POA: Insufficient documentation

## 2018-03-27 DIAGNOSIS — Z791 Long term (current) use of non-steroidal anti-inflammatories (NSAID): Secondary | ICD-10-CM | POA: Diagnosis not present

## 2018-03-27 DIAGNOSIS — R110 Nausea: Secondary | ICD-10-CM | POA: Diagnosis not present

## 2018-03-27 LAB — CBC
HEMATOCRIT: 42.3 % (ref 36.0–46.0)
HEMOGLOBIN: 13.2 g/dL (ref 12.0–15.0)
MCH: 28.8 pg (ref 26.0–34.0)
MCHC: 31.2 g/dL (ref 30.0–36.0)
MCV: 92.2 fL (ref 80.0–100.0)
Platelets: 310 10*3/uL (ref 150–400)
RBC: 4.59 MIL/uL (ref 3.87–5.11)
RDW: 12.5 % (ref 11.5–15.5)
WBC: 6.6 10*3/uL (ref 4.0–10.5)
nRBC: 0 % (ref 0.0–0.2)

## 2018-03-27 LAB — COMPREHENSIVE METABOLIC PANEL
ALBUMIN: 4.3 g/dL (ref 3.5–5.0)
ALT: 26 U/L (ref 0–44)
ANION GAP: 10 (ref 5–15)
AST: 25 U/L (ref 15–41)
Alkaline Phosphatase: 52 U/L (ref 38–126)
BUN: 9 mg/dL (ref 6–20)
CHLORIDE: 105 mmol/L (ref 98–111)
CO2: 27 mmol/L (ref 22–32)
CREATININE: 0.91 mg/dL (ref 0.44–1.00)
Calcium: 10 mg/dL (ref 8.9–10.3)
GFR calc non Af Amer: >60 mL/min (ref >60)
GLUCOSE: 99 mg/dL (ref 70–99)
Potassium: 4.5 mmol/L (ref 3.5–5.1)
SODIUM: 142 mmol/L (ref 135–145)
Total Bilirubin: 0.8 mg/dL (ref 0.3–1.2)
Total Protein: 7.4 g/dL (ref 6.5–8.1)

## 2018-03-27 LAB — LIPASE, BLOOD: Lipase: 33 U/L (ref 11–51)

## 2018-03-27 LAB — I-STAT BETA HCG BLOOD, ED (MC, WL, AP ONLY): I-stat hCG, quantitative: <5 m[IU]/mL (ref <5)

## 2018-03-27 LAB — I-STAT TROPONIN, ED: TROPONIN I, POC: 0.01 ng/mL (ref 0.00–0.08)

## 2018-03-27 LAB — TROPONIN I: Troponin I: <0.03 ng/mL (ref <0.03)

## 2018-03-27 MED ORDER — SODIUM CHLORIDE 0.9% FLUSH
3.0000 mL | Freq: Once | INTRAVENOUS | Status: AC
  Administered 2018-03-27: 3 mL via INTRAVENOUS

## 2018-03-27 MED ORDER — ACETAMINOPHEN 325 MG PO TABS: 650.0000 mg | ORAL_TABLET | ORAL | Status: DC | PRN

## 2018-03-27 MED ORDER — ONDANSETRON HCL 4 MG/2ML IJ SOLN: 4.0000 mg | Freq: Four times a day (QID) | INTRAMUSCULAR | Status: DC | PRN

## 2018-03-27 MED ORDER — LISINOPRIL 10 MG PO TABS
10.0000 mg | ORAL_TABLET | Freq: Every day | ORAL | Status: DC
  Administered 2018-03-28: 10 mg via ORAL
  Filled 2018-03-27: qty 1

## 2018-03-27 MED ORDER — MORPHINE SULFATE (PF) 2 MG/ML IV SOLN
2.0000 mg | INTRAVENOUS | Status: DC | PRN
  Administered 2018-03-27 – 2018-03-28 (×2): 2 mg via INTRAVENOUS
  Filled 2018-03-27 (×2): qty 1

## 2018-03-27 MED ORDER — DICLOFENAC SODIUM 1 % TD GEL
2.0000 g | Freq: Four times a day (QID) | TRANSDERMAL | Status: DC
  Filled 2018-03-27: qty 100

## 2018-03-27 MED ORDER — ALUM & MAG HYDROXIDE-SIMETH 200-200-20 MG/5ML PO SUSP
30.0000 mL | Freq: Once | ORAL | Status: AC
  Administered 2018-03-27: 30 mL via ORAL
  Filled 2018-03-27: qty 30

## 2018-03-27 MED ORDER — ENOXAPARIN SODIUM 40 MG/0.4ML ~~LOC~~ SOLN
40.0000 mg | SUBCUTANEOUS | Status: DC
  Administered 2018-03-27: 40 mg via SUBCUTANEOUS
  Filled 2018-03-27: qty 0.4

## 2018-03-27 MED ORDER — NITROGLYCERIN 0.4 MG SL SUBL
0.4000 mg | SUBLINGUAL_TABLET | SUBLINGUAL | Status: DC | PRN
  Administered 2018-03-27: 0.4 mg via SUBLINGUAL
  Filled 2018-03-27: qty 1

## 2018-03-27 MED ORDER — LIDOCAINE VISCOUS HCL 2 % MT SOLN
15.0000 mL | Freq: Once | OROMUCOSAL | Status: AC
  Administered 2018-03-27: 15 mL via ORAL
  Filled 2018-03-27: qty 15

## 2018-03-27 MED ORDER — ATORVASTATIN CALCIUM 10 MG PO TABS
20.0000 mg | ORAL_TABLET | Freq: Every day | ORAL | Status: DC
  Administered 2018-03-28: 20 mg via ORAL
  Filled 2018-03-27: qty 2

## 2018-03-27 NOTE — Progress Notes (Signed)
[name] 48 y.o.   Chief Complaint  Patient presents with  . Chest pain    left side, pt stated "sharp pain comes and goes, every 2-3 minutes, feeling tired,started about 12 noon."  . Back pain    mid to lower back    HISTORY OF PRESENT ILLNESS: This is a 48 y.o. female with history of hypertension high cholesterol, family history of CAD, complaining of left-sided dull chest pain that started around noon with mild diaphoresis.  Had similar episode 1 month ago.  HPI   Prior to Admission medications   Medication Sig Start Date End Date Taking? Authorizing Provider  atorvastatin (LIPITOR) 20 MG tablet Take 20 mg by mouth daily.   Yes [provider]  Black Cohosh (REMIFEMIN) 20 MG TABS Take 40 mg by mouth daily.   Yes [provider]  lisinopril (PRINIVIL,ZESTRIL) 10 MG tablet Take 1 tablet (10 mg total) by mouth daily. 08/15/17  Yes Rutherford Guys, MD  Multiple Vitamins-Minerals (HAIR/SKIN/NAILS/BIOTIN PO) Take 1 tablet by mouth daily.   Yes [provider]  ibuprofen (ADVIL,MOTRIN) 800 MG tablet Take 1 tablet (800 mg total) by mouth every 8 (eight) hours as needed. 03/25/18   Wendie Agreste, MD    Allergies  Allergen Reactions  . Keflex [Cephalexin] Anaphylaxis  . Penicillins Anaphylaxis    Has patient had a PCN reaction causing immediate rash, facial/tongue/throat swelling, SOB or lightheadedness with hypotension: Yes Has patient had a PCN reaction causing severe rash involving mucus membranes or skin necrosis: No Has patient had a PCN reaction that required hospitalization: No Has patient had a PCN reaction occurring within the last 10 years: No If all of the above answers are "NO", then may proceed with Cephalosporin use.     Patient Active Problem List   Diagnosis Date Noted  . Hand pain, right 03/25/2018  . Dyslipidemia 08/15/2017  . History of hysterectomy, supracervical 02/14/2017  . Trigger ring finger of right hand 08/07/2015   . Primary osteoarthritis of first carpometacarpal joint of right hand 08/07/2015  . Diverticulitis of sigmoid colon s/p colectomy 06/30/2014 07/26/2014  . Tachycardia 02/04/2014  . Hx of adenomatous polyp of colon 12/23/2013  . HTN (hypertension) 11/02/2013  . Former smoker 11/26/2008    Past Medical History:  Diagnosis Date  . Abnormal Pap smear 2000   [facility]  . Allergy   . Back pain   . Breast fibroadenoma   . Diverticulitis of colon with perforation 05/20/2014  . GERD (gastroesophageal reflux disease)   . H/O metrorrhagia 12/2006  . H/O myomectomy 07-2006   robotic  . H/O sinusitis   . Headache(784.0)   . History of conization of cervix 2000  . History of ectopic pregnancy 2005   r salpyngectomy  . History of hysterectomy, supracervical 08-2008   robotic  . History of syphilis   . History of syphilis   . Hx of adenomatous polyp of colon 12/31/2017  . Hx of herpes simplex type 2 infection   . Hypertension   . Ileostomy present ([place]) 10/26/2014  . Intra-abdominal abscess ([place]) 07/21/2014  . PMS (premenstrual syndrome)     Past Surgical History:  Procedure Laterality Date  . ABDOMINAL HYSTERECTOMY    . BREAST LUMPECTOMY  10/11   rt-neg  . BREAST SURGERY  2004   fibroademoma  . CERVICAL CONE BIOPSY  2000  . COLON RESECTION N/A 07/06/2014   Procedure: LAPAROSCOPIC LOOP ILEOSTOMY WITH PLACEMENT OF PELVIC DRAIN;  Surgeon: Donnie Mesa,  MD;  Location: [facility];  Service: General;  Laterality: N/A;  . COLON SURGERY    . DILATION AND CURETTAGE OF UTERUS  2009  . DIVERTING ILEOSTOMY Right 06/2014  . ECTOPIC PREGNANCY SURGERY  2005   R salpyngectomy  . ILEOSTOMY N/A 10/26/2014   Procedure: LOOP ILEOSTOMY REVERSAL;  Surgeon: Donnie Mesa, MD;  Location: [facility];  Service: General;  Laterality: N/A;  . LAPAROSCOPIC SIGMOID COLECTOMY N/A 06/30/2014   Procedure: LAPAROSCOPIC ASSISTED SIGMOID COLECTOMY;  Surgeon: Donnie Mesa, MD;  Location: [facility];  Service: General;  Laterality:  N/A;  . MYOMECTOMY  2008   robotic  . POLYPECTOMY  2009  . ROBOTIC ASSISTED LAP VAGINAL HYSTERECTOMY  2010   supracervical  . SALPINGECTOMY  2005  . TRIGGER FINGER RELEASE Right 09/28/2012   Procedure: RELEASE TRIGGER FINGER/A-1 PULLEY RIGHT THUMB;  Surgeon: Jolyn Nap, MD;  Location: [facility];  Service: Orthopedics;  Laterality: Right;    Social History   Socioeconomic History  . Marital status: Married    Spouse name: [name]  . Number of children: 1  . Years of education: Not on file  . Highest education level: Associate degree: occupational, Hotel manager, or vocational program  Occupational History  . Occupation: CMA    Comment: Primary Care at [employer]  . Financial resource strain: Not on file  . Food insecurity:    Worry: Not on file    Inability: Not on file  . Transportation needs:    Medical: Not on file    Non-medical: Not on file  Tobacco Use  . Smoking status: Former Smoker    Packs/day: 0.25    Years: 15.00    Pack years: 3.75    Types: Cigarettes    Last attempt to quit: 06/12/2009    Years since quitting: 8.7  . Smokeless tobacco: Never Used  Substance and Sexual Activity  . Alcohol use: Yes    Alcohol/week: 3.0 standard drinks    Types: 3 Standard drinks or equivalent per week  . Drug use: No  . Sexual activity: Yes  Lifestyle  . Physical activity:    Days per week: Not on file    Minutes per session: Not on file  . Stress: Not on file  Relationships  . Social connections:    Talks on phone: Not on file    Gets together: Not on file    Attends religious service: Not on file    Active member of club or organization: Not on file    Attends meetings of clubs or organizations: Not on file    Relationship status: Not on file  . Intimate partner violence:    Fear of current or ex partner: Not on file    Emotionally abused: Not on file    Physically abused: Not on file    Forced sexual activity: Not on file  Other  Topics Concern  . Not on file  Social History Narrative   Lives with her husband and their son.    Family History  Problem Relation Age of Onset  . Hypertension Paternal Grandmother   . Diabetes Paternal Grandmother   . Hypertension Maternal Grandmother   . Sarcoidosis Mother   . Hypertension Mother   . Thyroid disease Maternal Aunt   . Mental illness Cousin      Review of Systems  Constitutional: Positive for diaphoresis. Negative for chills and fever.  HENT: Negative.   Eyes: Negative.   Respiratory: Negative.  Negative for shortness of breath.   Cardiovascular: Positive for chest pain.  Gastrointestinal: Negative.  Negative for abdominal pain, nausea and vomiting.  Skin: Negative.  Negative for rash.  Neurological: Negative for dizziness, loss of consciousness and headaches.  Endo/Heme/Allergies: Negative.   All other systems reviewed and are negative.   Vitals:   03/27/18 1241  BP: 137/83  Pulse: 99  Temp: 98.2 F (36.8 C)  SpO2: 95%    Physical Exam Vitals signs reviewed.  Constitutional:      Appearance: Normal appearance.  HENT:     Head: Normocephalic and atraumatic.     Mouth/Throat:     Mouth: Mucous membranes are moist.     Pharynx: Oropharynx is clear.  Eyes:     Extraocular Movements: Extraocular movements intact.     Pupils: Pupils are equal, round, and reactive to light.  Neck:     Musculoskeletal: Normal range of motion and neck supple.  Cardiovascular:     Rate and Rhythm: Normal rate and regular rhythm.     Heart sounds: Normal heart sounds.  Pulmonary:     Effort: Pulmonary effort is normal.     Breath sounds: Normal breath sounds.  Musculoskeletal: Normal range of motion.  Skin:    General: Skin is warm and dry.  Neurological:     General: No focal deficit present.     Mental Status: She is alert and oriented to person, place, and time.     EKG: Normal sinus rhythm with normal ventricular rate.  Inverted T wave in lead III.   Mild ST segment elevation on V 1-3.  ASSESSMENT & PLAN: Zanae was seen today for chest pain and back pain.  Diagnoses and all orders for this visit:  Acute chest pain Comments: Rule out ACS.  Chest pain, unspecified type -     Cancel: EKG 12-Lead -     EKG 12-Lead  Nonspecific abnormal electrocardiogram (ECG) (EKG)   Patient needs further evaluation and treatment in the emergency room.  Emergency services called.  Case discussed with paramedics upon arrival.  Will transport to [facility].  Patient stable at time of discharge.   Agustina Caroli, MD [facility]

## 2018-03-27 NOTE — Patient Instructions (Signed)
° ° ° °  If you have lab work done today you will be contacted with your lab results within the next 2 weeks.  If you have not heard from us then please contact us. The fastest way to get your results is to register for My Chart. ° ° °IF you received an x-ray today, you will receive an invoice from [facility]. Please contact [facility] at [phone] with questions or concerns regarding your invoice.  ° °IF you received labwork today, you will receive an invoice from [facility]. Please contact [facility] at [phone] with questions or concerns regarding your invoice.  ° °Our billing staff will not be able to assist you with questions regarding bills from these companies. ° °You will be contacted with the lab results as soon as they are available. The fastest way to get your results is to activate your My Chart account. Instructions are located on the last page of this paperwork. If you have not heard from us regarding the results in 2 weeks, please contact this office. °  ° ° ° °

## 2018-03-27 NOTE — ED Notes (Signed)
Attempted to give report , nurse not available at this time

## 2018-03-27 NOTE — H&P (Addendum)
History and Physical    [name] DUK:[id number] DOB: Mar 23, 1970 DOA: 03/27/2018  PCP: Rutherford Guys, MD Patient coming from: Home/Physician's office  Chief Complaint: Chest pain  HPI: [name] is a 48 y.o. female with medical history significant of hypertension, hyperlipidemia, diverticulosis s/p colectomy.  Patient presents secondary to sharp nonradiating chest pain located substernally.  Symptoms started this afternoon around 12 PM while she was at work.  She was nonambulatory at the time.  Chest pain was intermittent and lasted a few minutes per episode.  She reports no associated dyspnea or palpitations.  While getting an EKG at her workplace she did say that she had some diaphoresis.  She was sent to the emergency department secondary to an abnormal EKG for further work-up.  ED Course: Vitals: No temperature obtained.  Normal pulse, normal respirations, slightly hypertensive, on room air with SPO2 of 95%. Labs: I-STAT troponin of 0.01 Imaging: Chest x-ray was normal Medications/Course: Nitroglycerin tablet which did not help much with her pain  Review of Systems: Review of Systems  Constitutional: Positive for chills and diaphoresis. Negative for fever.  Respiratory: Negative for cough, hemoptysis, sputum production, shortness of breath and wheezing.   Cardiovascular: Positive for chest pain. Negative for palpitations.  Gastrointestinal: Positive for nausea. Negative for abdominal pain, constipation, diarrhea and vomiting.  All other systems reviewed and are negative.   Past Medical History:  Diagnosis Date  . Abnormal Pap smear 2000   [facility]  . Allergy   . Back pain   . Breast fibroadenoma   . Diverticulitis of colon with perforation 05/20/2014  . GERD (gastroesophageal reflux disease)   . H/O metrorrhagia 12/2006  . H/O myomectomy 07-2006   robotic  . H/O sinusitis   . Headache(784.0)   . History of conization of cervix 2000  . History of  ectopic pregnancy 2005   r salpyngectomy  . History of hysterectomy, supracervical 08-2008   robotic  . History of syphilis   . History of syphilis   . Hx of adenomatous polyp of colon 12/31/2017  . Hx of herpes simplex type 2 infection   . Hypertension   . Ileostomy present ([place]) 10/26/2014  . Intra-abdominal abscess ([place]) 07/21/2014  . PMS (premenstrual syndrome)     Past Surgical History:  Procedure Laterality Date  . ABDOMINAL HYSTERECTOMY    . BREAST LUMPECTOMY  10/11   rt-neg  . BREAST SURGERY  2004   fibroademoma  . CERVICAL CONE BIOPSY  2000  . COLON RESECTION N/A 07/06/2014   Procedure: LAPAROSCOPIC LOOP ILEOSTOMY WITH PLACEMENT OF PELVIC DRAIN;  Surgeon: Donnie Mesa, MD;  Location: [facility];  Service: General;  Laterality: N/A;  . COLON SURGERY    . DILATION AND CURETTAGE OF UTERUS  2009  . DIVERTING ILEOSTOMY Right 06/2014  . ECTOPIC PREGNANCY SURGERY  2005   R salpyngectomy  . ILEOSTOMY N/A 10/26/2014   Procedure: LOOP ILEOSTOMY REVERSAL;  Surgeon: Donnie Mesa, MD;  Location: [facility];  Service: General;  Laterality: N/A;  . LAPAROSCOPIC SIGMOID COLECTOMY N/A 06/30/2014   Procedure: LAPAROSCOPIC ASSISTED SIGMOID COLECTOMY;  Surgeon: Donnie Mesa, MD;  Location: [facility];  Service: General;  Laterality: N/A;  . MYOMECTOMY  2008   robotic  . POLYPECTOMY  2009  . ROBOTIC ASSISTED LAP VAGINAL HYSTERECTOMY  2010   supracervical  . SALPINGECTOMY  2005  . TRIGGER FINGER RELEASE Right 09/28/2012   Procedure: RELEASE TRIGGER FINGER/A-1 PULLEY RIGHT THUMB;  Surgeon: Jolyn Nap, MD;  Location:  [facility];  Service: Orthopedics;  Laterality: Right;     reports that she quit smoking about 6 years ago. Her smoking use included cigarettes. She has a 20.00 pack-year smoking history. She has never used smokeless tobacco. She reports current alcohol use of about 3.0 standard drinks of alcohol per week. She reports that she does not use drugs.  Allergies  Allergen  Reactions  . Keflex [Cephalexin] Anaphylaxis  . Penicillins Anaphylaxis    Has patient had a PCN reaction causing immediate rash, facial/tongue/throat swelling, SOB or lightheadedness with hypotension: Yes Has patient had a PCN reaction causing severe rash involving mucus membranes or skin necrosis: No Has patient had a PCN reaction that required hospitalization: No Has patient had a PCN reaction occurring within the last 10 years: No If all of the above answers are "NO", then may proceed with Cephalosporin use.     Family History  Problem Relation Age of Onset  . Hypertension Paternal Grandmother   . Diabetes Paternal Grandmother   . Hypertension Maternal Grandmother   . Sarcoidosis Mother   . Hypertension Mother   . Thyroid disease Maternal Aunt   . Mental illness Cousin    Prior to Admission medications   Medication Sig Start Date End Date Taking? Authorizing Provider  atorvastatin (LIPITOR) 20 MG tablet Take 20 mg by mouth daily.   Yes [provider]  Black Cohosh (REMIFEMIN) 20 MG TABS Take 40 mg by mouth daily.   Yes [provider]  ibuprofen (ADVIL,MOTRIN) 800 MG tablet Take 1 tablet (800 mg total) by mouth every 8 (eight) hours as needed. Patient taking differently: Take 800 mg by mouth every 8 (eight) hours as needed for moderate pain.  03/25/18  Yes Wendie Agreste, MD  lisinopril (PRINIVIL,ZESTRIL) 10 MG tablet Take 1 tablet (10 mg total) by mouth daily. 08/15/17  Yes Rutherford Guys, MD  Multiple Vitamins-Minerals (HAIR/SKIN/NAILS/BIOTIN PO) Take 1 tablet by mouth daily.   Yes [provider]    Physical Exam:  Physical Exam Vitals signs reviewed.  Constitutional:      General: She is not in acute distress.    Appearance: She is well-developed. She is not diaphoretic.  Eyes:     Conjunctiva/sclera: Conjunctivae normal.     Pupils: Pupils are equal, round, and reactive to light.  Neck:     Musculoskeletal: Normal range of motion.    Cardiovascular:     Rate and Rhythm: Normal rate and regular rhythm.     Heart sounds: Normal heart sounds. No murmur.  Pulmonary:     Effort: Pulmonary effort is normal. No respiratory distress.     Breath sounds: Normal breath sounds. No wheezing or rales.  Chest:     Chest wall: Tenderness (reproducible) present.  Abdominal:     General: Bowel sounds are normal. There is no distension.     Palpations: Abdomen is soft.     Tenderness: There is no abdominal tenderness. There is no guarding or rebound.  Musculoskeletal: Normal range of motion.        General: No tenderness.  Lymphadenopathy:     Cervical: No cervical adenopathy.  Skin:    General: Skin is warm and dry.  Neurological:     Mental Status: She is alert and oriented to person, place, and time.     Labs on Admission: I have personally reviewed following labs and imaging studies  CBC: Recent Labs  Lab 03/27/18 1455  WBC 6.6  HGB 13.2  HCT 42.3  MCV 92.2  PLT 659    Basic Metabolic Panel: Recent Labs  Lab 03/27/18 1455  NA 142  K 4.5  CL 105  CO2 27  GLUCOSE 99  BUN 9  CREATININE 0.91  CALCIUM 10.0    GFR: Estimated Creatinine Clearance: 86.8 mL/min (by C-G formula based on SCr of 0.91 mg/dL).  Liver Function Tests: Recent Labs  Lab 03/27/18 1455  AST 25  ALT 26  ALKPHOS 52  BILITOT 0.8  PROT 7.4  ALBUMIN 4.3   Recent Labs  Lab 03/27/18 1455  LIPASE 33   No results for input(s): AMMONIA in the last 168 hours.  Coagulation Profile: No results for input(s): INR, PROTIME in the last 168 hours.  Cardiac Enzymes: No results for input(s): CKTOTAL, CKMB, CKMBINDEX, TROPONINI in the last 168 hours.  BNP (last 3 results) No results for input(s): PROBNP in the last 8760 hours.  HbA1C: No results for input(s): HGBA1C in the last 72 hours.  CBG: No results for input(s): GLUCAP in the last 168 hours.  Lipid Profile: No results for input(s): CHOL, HDL, LDLCALC, TRIG, CHOLHDL,  LDLDIRECT in the last 72 hours.  Thyroid Function Tests: No results for input(s): TSH, T4TOTAL, FREET4, T3FREE, THYROIDAB in the last 72 hours.  Anemia Panel: No results for input(s): VITAMINB12, FOLATE, FERRITIN, TIBC, IRON, RETICCTPCT in the last 72 hours.  Urine analysis:    Component Value Date/Time   COLORURINE YELLOW 11/02/2017 1746   APPEARANCEUR CLEAR 11/02/2017 1746   LABSPEC 1.028 11/02/2017 1746   PHURINE 5.0 11/02/2017 1746   GLUCOSEU NEGATIVE 11/02/2017 1746   HGBUR NEGATIVE 11/02/2017 1746   BILIRUBINUR NEGATIVE 11/02/2017 1746   BILIRUBINUR negative 05/29/2017 1742   KETONESUR NEGATIVE 11/02/2017 1746   PROTEINUR NEGATIVE 11/02/2017 1746   UROBILINOGEN 0.2 05/29/2017 1742   UROBILINOGEN 0.2 10/15/2014 1040   NITRITE NEGATIVE 11/02/2017 1746   LEUKOCYTESUR NEGATIVE 11/02/2017 1746     Radiological Exams on Admission: Dg Chest 2 View  Result Date: 03/27/2018 CLINICAL DATA:  Patient reports left chest pains and slight nausea started today. Hx of htn. No prior heart or lung surgery. Ex smoker quit 2011. EXAM: CHEST - 2 VIEW COMPARISON:  07/25/2014 FINDINGS: Cardiac silhouette is normal in size. Normal mediastinal and hilar contours. Clear lungs.  No pleural effusion or pneumothorax. Skeletal structures are intact. IMPRESSION: No active cardiopulmonary disease. Electronically Signed   By: Lajean Manes M.D.   On: 03/27/2018 14:15    EKG: Independently reviewed. rsr' in leads V1 and V2 is stable and likely normal variant. No ST-T changes.  Assessment/Plan Principal Problem:   Chest pain Active Problems:   Former smoker   Essential hypertension   Dyslipidemia   Chest pain Appears atypical.  Reproducible pain.  Heart score of 5.  PERC score of 0.  Would highly doubt PE in this situation. Not very responsive to nitroglycerin. Patient is a former smoker. Possible costochondritis vs reflux. Also low suspicion for dissection. -Cycle troponin -A.m. EKG -GI cocktail,  Voltaren gel to see if pain subsides with alternate treatments -Risk stratification labs: A.m. lipid panel, TSH, hemoglobin A1c  Essential hypertension Uncontrolled -Continue home lisinopril  Hyperlipidemia -Continue Lipitor   DVT prophylaxis: Lovenox Code Status: Full code Family Communication: Husband at bedside Disposition Plan: Discharge likely home tomorrow if heart workup negative Consults called: None Admission status: Observation, telemetry   Cordelia Poche, MD [facility] 03/27/2018, 4:59 PM  If 7PM-7AM, please contact night-coverage [email or web address] Password [email or web address]

## 2018-03-27 NOTE — ED Triage Notes (Signed)
Pt started having chest pain while at work. She reportrs it as ntermittent non radiating with mild nausea, no vomiting or shortness of breath. No hx. 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 He noted  inverted t-waves.12 lead with EMS showing NSR.   Vitals   140/90 90 hr 98%

## 2018-03-27 NOTE — ED Provider Notes (Signed)
[facility] EMERGENCY DEPARTMENT Provider Note   CSN: [id number] Arrival date & time: 03/27/18  1332     History   Chief Complaint Chief Complaint  Patient presents with  . Chest Pain    HPI [name] is a 48 y.o. female.  48 year old female with history of high cholesterol as well as hypertension presents with intermittent chest pain which began today at work.  Patient describes pressure in her chest with some associated dyspnea and diaphoresis.  Symptoms last from seconds to several minutes.  They radiate to her left arm as well as into her left jaw.  History of similar symptoms several weeks ago but she did not seek an evaluation.  Does have a strong family history of heart disease.  No recent cough or congestion.  Symptoms wax and wane and nothing makes them better worse no treatment use prior to arrival.  Presents via EMS.     Past Medical History:  Diagnosis Date  . Abnormal Pap smear 2000   [facility] Biopsy  . Allergy   . Back pain   . Breast fibroadenoma   . Diverticulitis of colon with perforation 05/20/2014  . GERD (gastroesophageal reflux disease)   . H/O metrorrhagia 12/2006  . H/O myomectomy 07-2006   robotic  . H/O sinusitis   . Headache(784.0)   . History of conization of cervix 2000  . History of ectopic pregnancy 2005   r salpyngectomy  . History of hysterectomy, supracervical 08-2008   robotic  . History of syphilis   . History of syphilis   . Hx of adenomatous polyp of colon 12/31/2017  . Hx of herpes simplex type 2 infection   . Hypertension   . Ileostomy present ([place]) 10/26/2014  . Intra-abdominal abscess ([place]) 07/21/2014  . PMS (premenstrual syndrome)     Patient Active Problem List   Diagnosis Date Noted  . Hand pain, right 03/25/2018  . Dyslipidemia 08/15/2017  . History of hysterectomy, supracervical 02/14/2017  . Trigger ring finger of right hand 08/07/2015  . Primary osteoarthritis of first carpometacarpal joint of  right hand 08/07/2015  . Diverticulitis of sigmoid colon s/p colectomy 06/30/2014 07/26/2014  . Tachycardia 02/04/2014  . Hx of adenomatous polyp of colon 12/23/2013  . HTN (hypertension) 11/02/2013  . Former smoker 11/26/2008    Past Surgical History:  Procedure Laterality Date  . ABDOMINAL HYSTERECTOMY    . BREAST LUMPECTOMY  10/11   rt-neg  . BREAST SURGERY  2004   fibroademoma  . CERVICAL CONE BIOPSY  2000  . COLON RESECTION N/A 07/06/2014   Procedure: LAPAROSCOPIC LOOP ILEOSTOMY WITH PLACEMENT OF PELVIC DRAIN;  Surgeon: Donnie Mesa, MD;  Location: [place];  Service: General;  Laterality: N/A;  . COLON SURGERY    . DILATION AND CURETTAGE OF UTERUS  2009  . DIVERTING ILEOSTOMY Right 06/2014  . ECTOPIC PREGNANCY SURGERY  2005   R salpyngectomy  . ILEOSTOMY N/A 10/26/2014   Procedure: LOOP ILEOSTOMY REVERSAL;  Surgeon: Donnie Mesa, MD;  Location: [place];  Service: General;  Laterality: N/A;  . LAPAROSCOPIC SIGMOID COLECTOMY N/A 06/30/2014   Procedure: LAPAROSCOPIC ASSISTED SIGMOID COLECTOMY;  Surgeon: Donnie Mesa, MD;  Location: [place];  Service: General;  Laterality: N/A;  . MYOMECTOMY  2008   robotic  . POLYPECTOMY  2009  . ROBOTIC ASSISTED LAP VAGINAL HYSTERECTOMY  2010   supracervical  . SALPINGECTOMY  2005  . TRIGGER FINGER RELEASE Right 09/28/2012   Procedure: RELEASE TRIGGER FINGER/A-1 PULLEY RIGHT  THUMB;  Surgeon: Jolyn Nap, MD;  Location: [place];  Service: Orthopedics;  Laterality: Right;     OB History    Gravida  4   Para  2   Term  2   Preterm      AB  2   Living  2     SAB  0   TAB  1   Ectopic  1   Multiple  0   Live Births               Home Medications    Prior to Admission medications   Medication Sig Start Date End Date Taking? Authorizing Provider  atorvastatin (LIPITOR) 20 MG tablet Take 20 mg by mouth daily.    [provider]  Black Cohosh (REMIFEMIN) 20 MG TABS Take 40 mg by mouth daily.     [provider]  ibuprofen (ADVIL,MOTRIN) 800 MG tablet Take 1 tablet (800 mg total) by mouth every 8 (eight) hours as needed. 03/25/18   Wendie Agreste, MD  lisinopril (PRINIVIL,ZESTRIL) 10 MG tablet Take 1 tablet (10 mg total) by mouth daily. 08/15/17   Rutherford Guys, MD  Multiple Vitamins-Minerals (HAIR/SKIN/NAILS/BIOTIN PO) Take 1 tablet by mouth daily.    [provider]    Family History Family History  Problem Relation Age of Onset  . Hypertension Paternal Grandmother   . Diabetes Paternal Grandmother   . Hypertension Maternal Grandmother   . Sarcoidosis Mother   . Hypertension Mother   . Thyroid disease Maternal Aunt   . Mental illness Cousin     Social History Social History   Tobacco Use  . Smoking status: Former Smoker    Packs/day: 0.25    Years: 15.00    Pack years: 3.75    Types: Cigarettes    Last attempt to quit: 06/12/2009    Years since quitting: 8.7  . Smokeless tobacco: Never Used  Substance Use Topics  . Alcohol use: Yes    Alcohol/week: 3.0 standard drinks    Types: 3 Standard drinks or equivalent per week  . Drug use: No     Allergies   Keflex [cephalexin] and Penicillins   Review of Systems Review of Systems  All other systems reviewed and are negative.    Physical Exam Updated Vital Signs Ht 1.626 m (5\' 4" )   Wt 101.6 kg   BMI 38.45 kg/m   Physical Exam Vitals signs and nursing note reviewed.  Constitutional:      General: She is not in acute distress.    Appearance: Normal appearance. She is well-developed. She is not toxic-appearing.  HENT:     Head: Normocephalic and atraumatic.  Eyes:     General: Lids are normal.     Conjunctiva/sclera: Conjunctivae normal.     Pupils: Pupils are equal, round, and reactive to light.  Neck:     Musculoskeletal: Normal range of motion and neck supple.     Thyroid: No thyroid mass.     Trachea: No tracheal deviation.  Cardiovascular:     Rate and Rhythm: Normal rate  and regular rhythm.     Heart sounds: Normal heart sounds. No murmur. No gallop.   Pulmonary:     Effort: Pulmonary effort is normal. No respiratory distress.     Breath sounds: Normal breath sounds. No stridor. No decreased breath sounds, wheezing, rhonchi or rales.  Abdominal:     General: Bowel sounds are normal. There is no distension.  Palpations: Abdomen is soft.     Tenderness: There is no abdominal tenderness. There is no rebound.  Musculoskeletal: Normal range of motion.        General: No tenderness.  Skin:    General: Skin is warm and dry.     Findings: No abrasion or rash.  Neurological:     Mental Status: She is alert and oriented to person, place, and time.     GCS: GCS eye subscore is 4. GCS verbal subscore is 5. GCS motor subscore is 6.     Cranial Nerves: No cranial nerve deficit.     Sensory: No sensory deficit.  Psychiatric:        Speech: Speech normal.        Behavior: Behavior normal.      ED Treatments / Results  Labs (all labs ordered are listed, but only abnormal results are displayed) Labs Reviewed  CBC  COMPREHENSIVE METABOLIC PANEL  LIPASE, BLOOD  I-STAT TROPONIN, ED    EKG EKG Interpretation  Date/Time:  Friday March 27 2018 13:40:59 EST Ventricular Rate:  85 PR Interval:    QRS Duration: 94 QT Interval:  391 QTC Calculation: 465 R Axis:   81 Text Interpretation:  Sinus rhythm RSR' in V1 or V2, right VCD or RVH Borderline ST elevation, lateral leads Confirmed by Lacretia Leigh ([id number]) on 03/27/2018 1:45:12 PM   Radiology Dg Hand Complete Right  Result Date: 03/25/2018 CLINICAL DATA:  Right hand pain. EXAM: RIGHT HAND - COMPLETE 3+ VIEW COMPARISON:  None. FINDINGS: There is no evidence of fracture or dislocation. There is no evidence of arthropathy or other focal bone abnormality. Soft tissues are unremarkable. IMPRESSION: Negative. Electronically Signed   By: Marijo Conception, M.D.   On: 03/25/2018 16:02    Procedures Procedures  (including critical care time)  Medications Ordered in ED Medications  sodium chloride flush (NS) 0.9 % injection 3 mL (has no administration in time range)     Initial Impression / Assessment and Plan / ED Course  I have reviewed the triage vital signs and the nursing notes.  Pertinent labs & imaging results that were available during my care of the patient were reviewed by me and considered in my medical decision making (see chart for details).     Patient has a heart score of 5.  Notes slight chest discomfort and will give nitroglycerin.  EKG without acute ischemic changes.  Troponin negative.  Will admit to the hospitalist service  Final Clinical Impressions(s) / ED Diagnoses   Final diagnoses:  None    ED Discharge Orders    None       Lacretia Leigh, MD 03/27/18 1534

## 2018-03-27 NOTE — Progress Notes (Signed)
Heart healthy diet order placed per [facility] MD Tray ordered

## 2018-03-28 ENCOUNTER — Encounter: Payer: Self-pay | Admitting: Family Medicine

## 2018-03-28 DIAGNOSIS — R0789 Other chest pain: Secondary | ICD-10-CM | POA: Diagnosis not present

## 2018-03-28 DIAGNOSIS — R9431 Abnormal electrocardiogram [ECG] [EKG]: Secondary | ICD-10-CM | POA: Diagnosis not present

## 2018-03-28 DIAGNOSIS — K5730 Diverticulosis of large intestine without perforation or abscess without bleeding: Secondary | ICD-10-CM | POA: Diagnosis not present

## 2018-03-28 DIAGNOSIS — E669 Obesity, unspecified: Secondary | ICD-10-CM

## 2018-03-28 DIAGNOSIS — Z87891 Personal history of nicotine dependence: Secondary | ICD-10-CM | POA: Diagnosis not present

## 2018-03-28 DIAGNOSIS — R079 Chest pain, unspecified: Secondary | ICD-10-CM | POA: Diagnosis not present

## 2018-03-28 DIAGNOSIS — K219 Gastro-esophageal reflux disease without esophagitis: Secondary | ICD-10-CM | POA: Diagnosis not present

## 2018-03-28 DIAGNOSIS — Z932 Ileostomy status: Secondary | ICD-10-CM | POA: Diagnosis not present

## 2018-03-28 DIAGNOSIS — Z86010 Personal history of colonic polyps: Secondary | ICD-10-CM | POA: Diagnosis not present

## 2018-03-28 DIAGNOSIS — I10 Essential (primary) hypertension: Secondary | ICD-10-CM | POA: Diagnosis not present

## 2018-03-28 DIAGNOSIS — E785 Hyperlipidemia, unspecified: Secondary | ICD-10-CM | POA: Diagnosis not present

## 2018-03-28 LAB — LIPID PANEL
Cholesterol: 162 mg/dL (ref 0–200)
HDL: 59 mg/dL (ref >40)
LDL Cholesterol: 82 mg/dL (ref 0–99)
Total CHOL/HDL Ratio: 2.7 RATIO
Triglycerides: 106 mg/dL (ref <150)
VLDL: 21 mg/dL (ref 0–40)

## 2018-03-28 LAB — TROPONIN I
Troponin I: <0.03 ng/mL (ref <0.03)
Troponin I: <0.03 ng/mL (ref <0.03)

## 2018-03-28 LAB — HEMOGLOBIN A1C
HEMOGLOBIN A1C: 5.9 % — AB (ref 4.8–5.6)
Mean Plasma Glucose: 122.63 mg/dL

## 2018-03-28 LAB — HIV ANTIBODY (ROUTINE TESTING W REFLEX): HIV Screen 4th Generation wRfx: NONREACTIVE

## 2018-03-28 LAB — TSH: TSH: 2.843 u[IU]/mL (ref 0.350–4.500)

## 2018-03-28 MED ORDER — KETOROLAC TROMETHAMINE 15 MG/ML IJ SOLN
15.0000 mg | Freq: Once | INTRAMUSCULAR | Status: AC
  Administered 2018-03-28: 15 mg via INTRAVENOUS
  Filled 2018-03-28: qty 1

## 2018-03-28 MED ORDER — DICLOFENAC SODIUM 1 % TD GEL: 2.0000 g | Freq: Four times a day (QID) | TRANSDERMAL | Status: DC

## 2018-03-28 NOTE — Consult Note (Addendum)
Cardiology Consultation:   Patient ID: [name] MRN: [id number]; DOB: [birth year]  Admit date: 03/27/2018 Date of Consult: 03/28/2018  Primary Care Provider: Rutherford Guys, MD Primary Cardiologist: New to [facility] Primary Electrophysiologist:  None    Patient Profile:   [name] is a 48 y.o. female with a hx of hypertension, hyperlipidemia, and diverticulosis status post colectomy who is being seen today for the evaluation of chest pain and abnormal EKG at the request of Dr. Cordelia Poche, internal medicine.  History of Present Illness:   [name] is a 48 y.o. female with a hx of hypertension, hyperlipidemia, and diverticulosis status post colectomy who is being seen today for the evaluation of chest pain and abnormal EKG at the request of Dr. Cordelia Poche, internal medicine.  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  She reports that as a child she was told she had a murmur and recalls seeing a cardiologist when she was around 32.  She recalls having an echocardiogram at that time but was told that everything was benign.  She denies any history of syncope or near syncope.  She does have a family history of congestive heart failure and bradycardia in her maternal grandmother who required a pacemaker.  There is no family history of myocardial infarction or sudden cardiac death.  Patient denies any prior history of diabetes but she does take medications for hypertension and hyperlipidemia.  She is a former smoker but quit 7 years ago.  She reports that she was at work yesterday and developed new onset sharp left-sided chest discomfort.  She also felt markedly fatigue and mildly diaphoretic.  She did not feel like her usual self.  Felt like she was dragging most of the day and had difficulty staying on task.  She told 1 of her providers at [facility] who decided to check an EKG.  Patient's EKG was felt to be abnormal and she was subsequently sent to the [facility] ER for further evaluation.  Patient states that in the ED she was given sublingual nitroglycerin which change the character of her pain from sharp to pressure-like.  It did not improve her symptoms.  She was also given a GI cocktail which did not improve her symptoms.  Morphine seemed to help the most.  Her chest pain is reproducible with palpation of the chest wall.  Cardiac enzymes are negative x3.  EKG shows sinus rhythm with borderline ST elevations in the lateral leads.  Chest x-ray unremarkable.  CBC and basic metabolic panel unremarkable.  Past Medical History:  Diagnosis Date  . Abnormal Pap smear 2000   [facility] Biopsy  . Allergy   . Back pain   . Breast fibroadenoma   . Diverticulitis of colon with perforation 05/20/2014  . GERD (gastroesophageal reflux disease)   . H/O metrorrhagia 12/2006  . H/O myomectomy 07-2006   robotic  . H/O sinusitis   . Headache(784.0)   . History of conization of cervix 2000  . History of ectopic pregnancy 2005   r salpyngectomy  . History of hysterectomy, supracervical 08-2008   robotic  . History of syphilis   . History of syphilis   . Hx of adenomatous polyp of colon 12/31/2017  . Hx of herpes simplex type 2 infection   . Hypertension   . Ileostomy present ([place]) 10/26/2014  . Intra-abdominal abscess ([place]) 07/21/2014  . PMS (premenstrual syndrome)     Past Surgical History:  Procedure Laterality Date  .  ABDOMINAL HYSTERECTOMY    . BREAST LUMPECTOMY  10/11   rt-neg  . BREAST SURGERY  2004   fibroademoma  . CERVICAL CONE BIOPSY  2000  . COLON RESECTION N/A 07/06/2014   Procedure: LAPAROSCOPIC LOOP ILEOSTOMY WITH PLACEMENT OF PELVIC DRAIN;  Surgeon: Donnie Mesa, MD;  Location: [place];  Service: General;  Laterality: N/A;  . COLON SURGERY    . DILATION AND CURETTAGE OF UTERUS  2009  . DIVERTING ILEOSTOMY Right 06/2014  . ECTOPIC PREGNANCY SURGERY  2005   R salpyngectomy  . ILEOSTOMY N/A 10/26/2014   Procedure: LOOP ILEOSTOMY REVERSAL;   Surgeon: Donnie Mesa, MD;  Location: [place];  Service: General;  Laterality: N/A;  . LAPAROSCOPIC SIGMOID COLECTOMY N/A 06/30/2014   Procedure: LAPAROSCOPIC ASSISTED SIGMOID COLECTOMY;  Surgeon: Donnie Mesa, MD;  Location: [place];  Service: General;  Laterality: N/A;  . MYOMECTOMY  2008   robotic  . POLYPECTOMY  2009  . ROBOTIC ASSISTED LAP VAGINAL HYSTERECTOMY  2010   supracervical  . SALPINGECTOMY  2005  . TRIGGER FINGER RELEASE Right 09/28/2012   Procedure: RELEASE TRIGGER FINGER/A-1 PULLEY RIGHT THUMB;  Surgeon: Jolyn Nap, MD;  Location: [place];  Service: Orthopedics;  Laterality: Right;       Inpatient Medications: Scheduled Meds: . atorvastatin  20 mg Oral Daily  . diclofenac sodium  2 g Topical QID  . enoxaparin (LOVENOX) injection  40 mg Subcutaneous Q24H  . lisinopril  10 mg Oral Daily   Continuous Infusions:  PRN Meds: acetaminophen, morphine injection, nitroGLYCERIN, ondansetron (ZOFRAN) IV  Allergies:    Allergies  Allergen Reactions  . Keflex [Cephalexin] Anaphylaxis  . Penicillins Anaphylaxis    Has patient had a PCN reaction causing immediate rash, facial/tongue/throat swelling, SOB or lightheadedness with hypotension: Yes Has patient had a PCN reaction causing severe rash involving mucus membranes or skin necrosis: No Has patient had a PCN reaction that required hospitalization: No Has patient had a PCN reaction occurring within the last 10 years: No If all of the above answers are "NO", then may proceed with Cephalosporin use.     Social History:   Social History   Socioeconomic History  . Marital status: Married    Spouse name: [name]  . Number of children: 1  . Years of education: Not on file  . Highest education level: Associate degree: occupational, Hotel manager, or vocational program  Occupational History  . Occupation: CMA    Comment: Primary Care at [employer]  . Financial resource strain: Not on file  .  Food insecurity:    Worry: Not on file    Inability: Not on file  . Transportation needs:    Medical: Not on file    Non-medical: Not on file  Tobacco Use  . Smoking status: Former Smoker    Packs/day: 1.00    Years: 20.00    Pack years: 20.00    Types: Cigarettes    Last attempt to quit: 06/13/2011    Years since quitting: 6.7  . Smokeless tobacco: Never Used  Substance and Sexual Activity  . Alcohol use: Yes    Alcohol/week: 3.0 standard drinks    Types: 3 Standard drinks or equivalent per week  . Drug use: No  . Sexual activity: Yes  Lifestyle  . Physical activity:    Days per week: Not on file    Minutes per session: Not on file  . Stress: Not on file  Relationships  . Social  connections:    Talks on phone: Not on file    Gets together: Not on file    Attends religious service: Not on file    Active member of club or organization: Not on file    Attends meetings of clubs or organizations: Not on file    Relationship status: Not on file  . Intimate partner violence:    Fear of current or ex partner: Not on file    Emotionally abused: Not on file    Physically abused: Not on file    Forced sexual activity: Not on file  Other Topics Concern  . Not on file  Social History Narrative   Lives with her husband and their son.    Family History:    Family History  Problem Relation Age of Onset  . Hypertension Paternal Grandmother   . Diabetes Paternal Grandmother   . Hypertension Maternal Grandmother   . Sarcoidosis Mother   . Hypertension Mother   . Thyroid disease Maternal Aunt   . Mental illness Cousin      ROS:  Please see the history of present illness.   All other ROS reviewed and negative.     Physical Exam/Data:   Vitals:   03/27/18 1541 03/27/18 1755 03/27/18 2230 03/28/18 0500  BP: (!) 147/87 (!) 161/70 (!) 118/51 106/60  Pulse: 90 81 79 74  Resp: 17 18 18 18   Temp:  97.9 F (36.6 C) 98.8 F (37.1 C) 98.1 F (36.7 C)  TempSrc:  Oral Oral  Oral  SpO2: 95% 97% 98% 99%  Weight:  99.4 kg    Height:  5\' 4"  (1.626 m)      Intake/Output Summary (Last 24 hours) at 03/28/2018 0811 Last data filed at 03/27/2018 1800 Gross per 24 hour  Intake 120 ml  Output -  Net 120 ml   Last 3 Weights 03/27/2018 03/27/2018 03/25/2018  Weight (lbs) 219 lb 2.2 oz 224 lb 224 lb  Weight (kg) 99.4 kg 101.606 kg 101.606 kg     Body mass index is 37.61 kg/m.  General:  Well nourished, well developed, in no acute distress HEENT: normal Lymph: no adenopathy Neck: no JVD Endocrine:  No thryomegaly Vascular: No carotid bruits; FA pulses 2+ bilaterally without bruits  Cardiac:  normal S1, S2; RRR; no murmur  Lungs:  clear to auscultation bilaterally, no wheezing, rhonchi or rales  Abd: soft, nontender, no hepatomegaly  Ext: no edema Musculoskeletal:  No deformities, BUE and BLE strength normal and equal Skin: warm and dry  Neuro:  CNs 2-12 intact, no focal abnormalities noted Psych:  Normal affect   EKG:  The EKG was personally reviewed and demonstrates:  NSR, borderline lateral ST elevations Telemetry:  Telemetry was personally reviewed and demonstrates:  NSR   Relevant CV Studies: None   Laboratory Data:  Chemistry Recent Labs  Lab 03/27/18 1455  NA 142  K 4.5  CL 105  CO2 27  GLUCOSE 99  BUN 9  CREATININE 0.91  CALCIUM 10.0  GFRNONAA >60  GFRAA >60  ANIONGAP 10    Recent Labs  Lab 03/27/18 1455  PROT 7.4  ALBUMIN 4.3  AST 25  ALT 26  ALKPHOS 52  BILITOT 0.8   Hematology Recent Labs  Lab 03/27/18 1455  WBC 6.6  RBC 4.59  HGB 13.2  HCT 42.3  MCV 92.2  MCH 28.8  MCHC 31.2  RDW 12.5  PLT 310   Cardiac Enzymes Recent Labs  Lab 03/27/18 1905 03/28/18  0156 03/28/18 0600  TROPONINI <0.03 <0.03 <0.03    Recent Labs  Lab 03/27/18 1500  TROPIPOC 0.01    BNPNo results for input(s): BNP, PROBNP in the last 168 hours.  DDimer No results for input(s): DDIMER in the last 168 hours.  Radiology/Studies:  Dg Chest  2 View  Result Date: 03/27/2018 CLINICAL DATA:  Patient reports left chest pains and slight nausea started today. Hx of htn. No prior heart or lung surgery. Ex smoker quit 2011. EXAM: CHEST - 2 VIEW COMPARISON:  07/25/2014 FINDINGS: Cardiac silhouette is normal in size. Normal mediastinal and hilar contours. Clear lungs.  No pleural effusion or pneumothorax. Skeletal structures are intact. IMPRESSION: No active cardiopulmonary disease. Electronically Signed   By: Lajean Manes M.D.   On: 03/27/2018 14:15   Dg Hand Complete Right  Result Date: 03/25/2018 CLINICAL DATA:  Right hand pain. EXAM: RIGHT HAND - COMPLETE 3+ VIEW COMPARISON:  None. FINDINGS: There is no evidence of fracture or dislocation. There is no evidence of arthropathy or other focal bone abnormality. Soft tissues are unremarkable. IMPRESSION: Negative. Electronically Signed   By: Marijo Conception, M.D.   On: 03/25/2018 16:02    Assessment and Plan:   [name] is a 48 y.o. female with a hx of hypertension, hyperlipidemia, and diverticulosis status post colectomy who is being seen today for the evaluation of chest pain and abnormal EKG at the request of Dr. Cordelia Poche, internal medicine.  1.  Chest pain: Chest pain is atypical and not consistent with cardiac etiology.  She has reproducible chest pain with palpation of the left chest wall consistent with costochondritis.  Troponins are negative x3.  Her EKG shows normal sinus rhythm.  Initial EKG on 2/7 showed slight lateral ST abnormalities, but not consistent with acute coronary syndrome.  She had the most improvement with morphine. Consider trial of Toradol.  Continue management of cardiac risk factors.  No further inpatient cardiac work-up recommended.  For questions or updates, please contact [facility] Please consult [email or web address] for contact info under     Signed, Lyda Jester, PA-C  03/28/2018 8:11 AM   The patient was seen and examined, and I agree with  the history, physical exam, assessment and plan as documented above, with modifications as noted below. I have also personally reviewed all relevant documentation, old records, labs, and both radiographic and cardiovascular studies. I have also independently interpreted old and new ECG's.  Briefly, this is a 48 year old woman with a history of hypertension, hyperlipidemia, and prior history of tobacco use who presented to the hospital with new onset left-sided chest pain accompanied by fatigue and mild diaphoresis.  She has since ruled out for acute coronary syndrome with serial normal troponins.  I personally reviewed the ECGs performed yesterday and today and they are without significant ischemic abnormalities.  Chest x-ray was also normal.  Chest pain was reproducible with palpation of the chest wall.  Sublingual nitroglycerin and GI cocktail did not seem to alleviate her symptoms but morphine did.  Overall symptoms appear to be atypical for cardiac etiology.  I did inform her that should she develop exertional chest pain and/or dyspnea, we could reevaluate her in the outpatient setting and determine if cardiac testing is indicated at that time.  No further recommendations.   Kate Sable, MD, [facility]  03/28/2018 10:42 AM   [facility] will sign off.   Medication Recommendations:  As above Other recommendations (labs, testing, etc):  None Follow up as an  outpatient:  As needed

## 2018-03-28 NOTE — Progress Notes (Signed)
Per patient GI cocktail could not give her relief of her chest pain. Therefore alternative was given.

## 2018-03-29 DIAGNOSIS — Z6838 Body mass index (BMI) 38.0-38.9, adult: Secondary | ICD-10-CM | POA: Diagnosis present

## 2018-03-29 DIAGNOSIS — E669 Obesity, unspecified: Secondary | ICD-10-CM | POA: Diagnosis present

## 2018-03-29 DIAGNOSIS — E6609 Other obesity due to excess calories: Secondary | ICD-10-CM | POA: Diagnosis present

## 2018-03-29 NOTE — Discharge Summary (Addendum)
[name], is a 48 y.o. female  DOB August 30, 1970  MRN [id number].  Admission date:  03/27/2018  Admitting Physician  Mariel Aloe, MD  Discharge Date:  03/28/2018   Primary MD  Rutherford Guys, MD  Recommendations for primary care physician for things to follow:   May warrant referral to cardiology if symptoms persist   Discharge Diagnosis    Principal Problem:   Atypical chest pain Active Problems:   Former smoker   Essential hypertension   Dyslipidemia   Obesity (BMI 30-39.9)      Past Medical History:  Diagnosis Date  . Abnormal Pap smear 2000   [facility] Biopsy  . Allergy   . Back pain   . Breast fibroadenoma   . Diverticulitis of colon with perforation 05/20/2014  . GERD (gastroesophageal reflux disease)   . H/O metrorrhagia 12/2006  . H/O myomectomy 07-2006   robotic  . H/O sinusitis   . Headache(784.0)   . History of conization of cervix 2000  . History of ectopic pregnancy 2005   r salpyngectomy  . History of hysterectomy, supracervical 08-2008   robotic  . History of syphilis   . History of syphilis   . Hx of adenomatous polyp of colon 12/31/2017  . Hx of herpes simplex type 2 infection   . Hypertension   . Ileostomy present ([place]) 10/26/2014  . Intra-abdominal abscess ([place]) 07/21/2014  . PMS (premenstrual syndrome)     Past Surgical History:  Procedure Laterality Date  . ABDOMINAL HYSTERECTOMY    . BREAST LUMPECTOMY  10/11   rt-neg  . BREAST SURGERY  2004   fibroademoma  . CERVICAL CONE BIOPSY  2000  . COLON RESECTION N/A 07/06/2014   Procedure: LAPAROSCOPIC LOOP ILEOSTOMY WITH PLACEMENT OF PELVIC DRAIN;  Surgeon: Donnie Mesa, MD;  Location: [place];  Service: General;  Laterality: N/A;  . COLON SURGERY    . DILATION AND CURETTAGE OF UTERUS  2009  . DIVERTING ILEOSTOMY Right 06/2014  .  ECTOPIC PREGNANCY SURGERY  2005   R salpyngectomy  . ILEOSTOMY N/A 10/26/2014   Procedure: LOOP ILEOSTOMY REVERSAL;  Surgeon: Donnie Mesa, MD;  Location: [place];  Service: General;  Laterality: N/A;  . LAPAROSCOPIC SIGMOID COLECTOMY N/A 06/30/2014   Procedure: LAPAROSCOPIC ASSISTED SIGMOID COLECTOMY;  Surgeon: Donnie Mesa, MD;  Location: [place];  Service: General;  Laterality: N/A;  . MYOMECTOMY  2008   robotic  . POLYPECTOMY  2009  . ROBOTIC ASSISTED LAP VAGINAL HYSTERECTOMY  2010   supracervical  . SALPINGECTOMY  2005  . TRIGGER FINGER RELEASE Right 09/28/2012   Procedure: RELEASE TRIGGER FINGER/A-1 PULLEY RIGHT THUMB;  Surgeon: Jolyn Nap, MD;  Location: [place];  Service: Orthopedics;  Laterality: Right;       HPI  from the history and physical done on the day of admission:    [name] is a 48 y.o. female with medical history significant of hypertension, hyperlipidemia, diverticulosis s/p colectomy.  Patient presents secondary to sharp nonradiating chest  pain located substernally.  Symptoms started this afternoon around 12 PM while she was at work.  She was nonambulatory at the time.  Chest pain was intermittent and lasted a few minutes per episode.  She reports no associated dyspnea or palpitations.  While getting an EKG at her workplace she did say that she had some diaphoresis.  She was sent to the emergency department secondary to an abnormal EKG for further work-up.  ED Course: Vitals: No temperature obtained.  Normal pulse, normal respirations, slightly hypertensive, on room air with SPO2 of 95%. Labs: I-STAT troponin of 0.01 Imaging: Chest x-ray was normal Medications/Course: Nitroglycerin tablet which did not help much with her pain   Hospital Course:   1.  Atypical chest pain: Resolving.  Pain was noted to be reproducible on physical exam heart score - 5 and PERC - 0. Cardiac troponins were negative x4.  EKG did showed some borderline ST  elevations in the lateral leads chest x-ray was negative for any acute abnormalities.  No change in symptoms with nitroglycerin.  Patient pain was noted to be reproducible with palpation.   Cardiology had been consulted, but did not recommend any further work-up.  Symptoms most likely thought to be related with costochondritis.  Patient was recommended to use NSAIDs sparingly and Voltaren gel which she notes she has at home for relief of symptoms.  Information concerning costochondritis also provided.  She was also recommended to follow-up with cardiology in outpatient setting if symptoms persisted for further work-up.  2.  Essential hypertension: Blood pressures prior to discharge noted to be within normal limits at 129/55.  Patient was continued on lisinopril.  Recommend outpatient monitoring of blood pressure.  3.  Hyperlipidemia: Total cholesterol noted to be 162, HDL 59, LDL 82.  Patient was continued on atorvastatin.  4.  Prediabetes: Hemoglobin A1c noted to be 5.9.  Recommend continued outpatient monitoring  5.  Remote history of tobacco use.  6.  Obesity: BMI 37.61.  Patient was advised on the need of weight loss with exercise and healthy dietary habits.  Follow UP     Consults obtained: [facility] cardiology  Discharge Condition: Stable  Diet and Activity recommendation: See Discharge Instructions below  Discharge Instructions    Discharge instructions   Complete by:  As directed    Follow with Primary Rutherford Guys, MD within 7 days.  Please use Voltaren/diclofenac gel as needed for chest pain or sparingly use ibuprofen to treat symptoms.  The pain is suspected to be related with the muscles in between the bones of your chest wall.  Your work-up did not give concern for a blockage to your heart.  It is recommend that you follow-up with your primary care provider and may warrant referral to cardiology in the outpatient setting if symptoms persist.  Cbc and BMP -  checked  by Primary  MD or SNF MD in 5-7 days ( we routinely change or add medications that can affect your baseline labs and fluid status, therefore we recommend that you get the mentioned basic workup next visit with your PCP, your PCP may decide not to get them or add new tests based on their clinical decision)  Activity: As tolerated  Disposition: Home   Diet: Carbohydrate modified       For Heart failure patients - Check your Weight same time everyday, if you gain over 2 pounds, or you develop in leg swelling, experience more shortness of breath or chest pain, call your Primary doctor  immediately. Follow Cardiac Low Salt Diet and 1.5 lit/day fluid restriction.  Special Instructions: If you have smoked or chewed Tobacco  in the last 2 yrs please stop smoking, stop any regular Alcohol  and or any Recreational drug use.  On your next visit with your primary care physician please Get Medicines reviewed and adjusted.  Please request your Rutherford Guys, MD to go over all Hospital Tests and Procedure/Radiological results at the follow up, please get all Hospital records sent to your Prim MD by signing hospital release before you go home.  If you experience worsening of your admission symptoms, develop shortness of breath, life threatening emergency, suicidal or homicidal thoughts you must seek medical attention immediately by calling 911 or calling your MD immediately  if symptoms less severe.  You Must read complete instructions/literature along with all the possible adverse reactions/side effects for all the Medicines you take and that have been prescribed to you. Take any new Medicines after you have completely understood and accpet all the possible adverse reactions/side effects.   Do not drive, operate heavy machinery, perform activities at heights, swimming or participation in water activities or provide baby sitting services if your were admitted for syncope or siezures until you have seen by Primary MD or  a Neurologist and advised to do so again.  Do not drive when taking Pain medications.  Do not take more than prescribed Pain, Sleep and Anxiety Medications  Wear Seat belts while driving.   Please note  You were cared for by a hospitalist during your hospital stay. If you have any questions about your discharge medications or the care you received while you were in the hospital after you are discharged, you can call the unit and asked to speak with the hospitalist on call if the hospitalist that took care of you is not available. Once you are discharged, your primary care physician will handle any further medical issues. Please note that NO REFILLS for any discharge medications will be authorized once you are discharged, as it is imperative that you return to your primary care physician (or establish a relationship with a primary care physician if you do not have one) for your aftercare needs so that they can reassess your need for medications and monitor your lab values.        Discharge Medications     Allergies as of 03/28/2018      Reactions   Keflex [cephalexin] Anaphylaxis   Penicillins Anaphylaxis   Has patient had a PCN reaction causing immediate rash, facial/tongue/throat swelling, SOB or lightheadedness with hypotension: Yes Has patient had a PCN reaction causing severe rash involving mucus membranes or skin necrosis: No Has patient had a PCN reaction that required hospitalization: No Has patient had a PCN reaction occurring within the last 10 years: No If all of the above answers are "NO", then may proceed with Cephalosporin use.      Medication List    TAKE these medications   atorvastatin 20 MG tablet Commonly known as:  LIPITOR Take 20 mg by mouth daily.   diclofenac sodium 1 % Gel Commonly known as:  VOLTAREN Apply 2 g topically 4 (four) times daily.   HAIR/SKIN/NAILS/BIOTIN PO Take 1 tablet by mouth daily.   ibuprofen 800 MG tablet Commonly known as:   ADVIL,MOTRIN Take 1 tablet (800 mg total) by mouth every 8 (eight) hours as needed. What changed:  reasons to take this   lisinopril 10 MG tablet Commonly known as:  PRINIVIL,ZESTRIL Take 1 tablet (10 mg total) by mouth daily.   REMIFEMIN 20 MG Tabs Generic drug:  Black Cohosh Take 40 mg by mouth daily.       Major procedures and Radiology Reports - PLEASE review detailed and final reports for all details, in brief -    Dg Chest 2 View  Result Date: 03/27/2018 CLINICAL DATA:  Patient reports left chest pains and slight nausea started today. Hx of htn. No prior heart or lung surgery. Ex smoker quit 2011. EXAM: CHEST - 2 VIEW COMPARISON:  07/25/2014 FINDINGS: Cardiac silhouette is normal in size. Normal mediastinal and hilar contours. Clear lungs.  No pleural effusion or pneumothorax. Skeletal structures are intact. IMPRESSION: No active cardiopulmonary disease. Electronically Signed   By: Lajean Manes M.D.   On: 03/27/2018 14:15   Dg Hand Complete Right  Result Date: 03/25/2018 CLINICAL DATA:  Right hand pain. EXAM: RIGHT HAND - COMPLETE 3+ VIEW COMPARISON:  None. FINDINGS: There is no evidence of fracture or dislocation. There is no evidence of arthropathy or other focal bone abnormality. Soft tissues are unremarkable. IMPRESSION: Negative. Electronically Signed   By: Marijo Conception, M.D.   On: 03/25/2018 16:02    Micro Results    No results found for this or any previous visit (from the past 240 hour(s)).     Today   Subjective    Jordana Pate-Lish notes that she still has chest pain and discomfort.  Acid reflux medication did not provide any significant relief of symptoms, but the pain medications did provide some relief.  Denies any significant heavy lifting or strenuous activity.   Objective   Blood pressure (!) 129/55, pulse 87, temperature 99.1 F (37.3 C), temperature source Oral, resp. rate 16, height 5\' 4"  (1.626 m), weight 99.4 kg, SpO2 95 %.  No intake or  output data in the 24 hours ending 03/29/18 0630  Exam  Constitutional: Obese NAD, calm, comfortable Eyes: PERRL, lids and conjunctivae normal ENMT: Mucous membranes are moist. Posterior pharynx clear of any exudate or lesions. Normal dentition.  Neck: normal, supple, no masses, no thyromegaly Respiratory: clear to auscultation bilaterally, no wheezing, no crackles. Normal respiratory effort. No accessory muscle use.  Cardiovascular: Regular rate and rhythm, no murmurs / rubs / gallops. No extremity edema. 2+ pedal pulses. No carotid bruits.  Abdomen: no tenderness, no masses palpated. No hepatosplenomegaly. Bowel sounds positive.  Musculoskeletal: no clubbing / cyanosis. No joint deformity upper and lower extremities. Good ROM, no contractures. Normal muscle tone.  Skin: no rashes, lesions, ulcers. No induration Neurologic: CN 2-12 grossly intact. Sensation intact, DTR normal. Strength 5/5 in all 4.  Psychiatric: Normal judgment and insight. Alert and oriented x 3. Normal mood.    Data Review   CBC w Diff:  Lab Results  Component Value Date   WBC 6.6 03/27/2018   HGB 13.2 03/27/2018   HGB 13.1 08/15/2017   HCT 42.3 03/27/2018   HCT 39.8 08/15/2017   PLT 310 03/27/2018   PLT 353 08/15/2017   LYMPHOPCT 42 10/15/2014   MONOPCT 10 10/15/2014   EOSPCT 5 10/15/2014   BASOPCT 1 10/15/2014    CMP:  Lab Results  Component Value Date   NA 142 03/27/2018   NA 143 08/15/2017   K 4.5 03/27/2018   CL 105 03/27/2018   CO2 27 03/27/2018   BUN 9 03/27/2018   BUN 14 08/15/2017   CREATININE 0.91 03/27/2018   PROT 7.4 03/27/2018   PROT 7.5 08/15/2017  ALBUMIN 4.3 03/27/2018   ALBUMIN 4.6 08/15/2017   BILITOT 0.8 03/27/2018   BILITOT 0.4 08/15/2017   ALKPHOS 52 03/27/2018   AST 25 03/27/2018   ALT 26 03/27/2018  .   Total Time in preparing paper work, data evaluation and todays exam - 35 minutes  Norval Morton M.D on 03/29/2018 at 6:30 AM  [facility]   Office   [phone]

## 2018-03-31 ENCOUNTER — Telehealth: Payer: Self-pay | Admitting: Emergency Medicine

## 2018-03-31 NOTE — Addendum Note (Signed)
Addended by: Davina Poke on: 03/31/2018 01:32 PM   Modules accepted: Orders

## 2018-04-02 ENCOUNTER — Ambulatory Visit (INDEPENDENT_AMBULATORY_CARE_PROVIDER_SITE_OTHER): Payer: 59 | Admitting: Cardiology

## 2018-04-02 ENCOUNTER — Encounter: Payer: Self-pay | Admitting: Cardiology

## 2018-04-02 VITALS — BP 147/93 | HR 109 | Ht 64.0 in | Wt 225.5 lb

## 2018-04-02 DIAGNOSIS — I208 Other forms of angina pectoris: Secondary | ICD-10-CM

## 2018-04-02 DIAGNOSIS — I10 Essential (primary) hypertension: Secondary | ICD-10-CM | POA: Diagnosis not present

## 2018-04-02 DIAGNOSIS — E785 Hyperlipidemia, unspecified: Secondary | ICD-10-CM

## 2018-04-02 DIAGNOSIS — R0789 Other chest pain: Secondary | ICD-10-CM | POA: Diagnosis not present

## 2018-04-02 DIAGNOSIS — R9431 Abnormal electrocardiogram [ECG] [EKG]: Secondary | ICD-10-CM | POA: Diagnosis not present

## 2018-04-02 MED ORDER — AMLODIPINE BESYLATE 5 MG PO TABS: 5.0000 mg | ORAL_TABLET | Freq: Every day | ORAL | 3 refills | Status: DC

## 2018-04-02 MED ORDER — NITROGLYCERIN 0.4 MG SL SUBL: 0.4000 mg | SUBLINGUAL_TABLET | SUBLINGUAL | 1 refills | Status: DC | PRN

## 2018-04-02 MED FILL — NITROGLYCERIN 0.4 MG TAB SL: 0.4 | 10 days supply | Qty: 25 | Fill #0

## 2018-04-02 MED FILL — AMLODIPINE BESYLATE 5 MG TA: 5 | 90 days supply | Qty: 90 | Fill #0

## 2018-04-02 NOTE — Progress Notes (Signed)
Subjective:   @Patient  ID: [name], female    DOB: 1971-01-01, 48 y.o.   MRN: [id number]  Chief Complaint  Patient presents with  . Chest Pain    NP Eval   HPI   [name] is a 48 year old female with a medical history significant for hypertension, hyperlipidemia, and diverticulosis status post colectomy who presents to the the clinic for an evaluation of chest pain after an ED visit on 03/27/2018. Patient was first seen at Urgent Care for intermittent, substernal chest pain where her EKG demonstrated normal sinus rhythm with normal ventricular rate, inverted T wave in lead III, mild ST segment elevation on V 1-3 and was then transported to the ED. ED work-up was unremarkable with negative troponins and normal chest x-ray.  The episodes of chest pain last few minutes and usually appear when she is active. It is associated with diaphoresis and severe fatigue. She denies shortness of breath.   Patient used to smoke for 20 years, about halt a pack a day, she quit in 05/2011.  Her known hyperlipidemia is being controlled by her PCP.   Past Medical History:  Diagnosis Date  . Abnormal Pap smear 2000   [facility] Biopsy  . Allergy   . Back pain   . Breast fibroadenoma   . Diverticulitis of colon with perforation 05/20/2014  . GERD (gastroesophageal reflux disease)   . H/O metrorrhagia 12/2006  . H/O myomectomy 07-2006   robotic  . H/O sinusitis   . Headache(784.0)   . History of conization of cervix 2000  . History of ectopic pregnancy 2005   r salpyngectomy  . History of hysterectomy, supracervical 08-2008   robotic  . History of syphilis   . History of syphilis   . Hx of adenomatous polyp of colon 12/31/2017  . Hx of herpes simplex type 2 infection   . Hypertension   . Ileostomy present ([place]) 10/26/2014  . Intra-abdominal abscess ([place]) 07/21/2014  . PMS (premenstrual syndrome)     Past Surgical History:  Procedure Laterality Date  . ABDOMINAL HYSTERECTOMY    . BREAST  LUMPECTOMY  10/11   rt-neg  . BREAST SURGERY  2004   fibroademoma  . CERVICAL CONE BIOPSY  2000  . COLON RESECTION N/A 07/06/2014   Procedure: LAPAROSCOPIC LOOP ILEOSTOMY WITH PLACEMENT OF PELVIC DRAIN;  Surgeon: Donnie Mesa, MD;  Location: [place];  Service: General;  Laterality: N/A;  . COLON SURGERY    . DILATION AND CURETTAGE OF UTERUS  2009  . DIVERTING ILEOSTOMY Right 06/2014  . ECTOPIC PREGNANCY SURGERY  2005   R salpyngectomy  . ILEOSTOMY N/A 10/26/2014   Procedure: LOOP ILEOSTOMY REVERSAL;  Surgeon: Donnie Mesa, MD;  Location: [place];  Service: General;  Laterality: N/A;  . LAPAROSCOPIC SIGMOID COLECTOMY N/A 06/30/2014   Procedure: LAPAROSCOPIC ASSISTED SIGMOID COLECTOMY;  Surgeon: Donnie Mesa, MD;  Location: [place];  Service: General;  Laterality: N/A;  . MYOMECTOMY  2008   robotic  . POLYPECTOMY  2009  . ROBOTIC ASSISTED LAP VAGINAL HYSTERECTOMY  2010   supracervical  . SALPINGECTOMY  2005  . TRIGGER FINGER RELEASE Right 09/28/2012   Procedure: RELEASE TRIGGER FINGER/A-1 PULLEY RIGHT THUMB;  Surgeon: Jolyn Nap, MD;  Location: [place];  Service: Orthopedics;  Laterality: Right;    Social History   Socioeconomic History  . Marital status: Married    Spouse name: [name]  . Number of children: 1  . Years of education: Not  on file  . Highest education level: Associate degree: occupational, Hotel manager, or vocational program  Occupational History  . Occupation: CMA    Comment: Primary Care at [employer]  . Financial resource strain: Not on file  . Food insecurity:    Worry: Not on file    Inability: Not on file  . Transportation needs:    Medical: Not on file    Non-medical: Not on file  Tobacco Use  . Smoking status: Former Smoker    Packs/day: 1.00    Years: 20.00    Pack years: 20.00    Types: Cigarettes    Last attempt to quit: 06/13/2011    Years since quitting: 6.8  . Smokeless tobacco: Never Used  Substance and Sexual  Activity  . Alcohol use: Yes    Alcohol/week: 3.0 standard drinks    Types: 3 Standard drinks or equivalent per week  . Drug use: No  . Sexual activity: Yes  Lifestyle  . Physical activity:    Days per week: Not on file    Minutes per session: Not on file  . Stress: Not on file  Relationships  . Social connections:    Talks on phone: Not on file    Gets together: Not on file    Attends religious service: Not on file    Active member of club or organization: Not on file    Attends meetings of clubs or organizations: Not on file    Relationship status: Not on file  . Intimate partner violence:    Fear of current or ex partner: Not on file    Emotionally abused: Not on file    Physically abused: Not on file    Forced sexual activity: Not on file  Other Topics Concern  . Not on file  Social History Narrative   Lives with her husband and their son.    Current Outpatient Medications on File Prior to Visit  Medication Sig Dispense Refill  . atorvastatin (LIPITOR) 20 MG tablet Take 20 mg by mouth daily.    . Black Cohosh (REMIFEMIN) 20 MG TABS Take 40 mg by mouth daily.    . diclofenac sodium (VOLTAREN) 1 % GEL Apply 2 g topically 4 (four) times daily.    Marland Kitchen ibuprofen (ADVIL,MOTRIN) 800 MG tablet Take 1 tablet (800 mg total) by mouth every 8 (eight) hours as needed. (Patient taking differently: Take 800 mg by mouth every 8 (eight) hours as needed for moderate pain. ) 30 tablet 0  . lisinopril (PRINIVIL,ZESTRIL) 10 MG tablet Take 1 tablet (10 mg total) by mouth daily. 90 tablet 1  . Multiple Vitamins-Minerals (HAIR/SKIN/NAILS/BIOTIN PO) Take 1 tablet by mouth daily.     No current facility-administered medications on file prior to visit.     Review of Systems  Constitutional: Negative for unexpected weight change.  HENT: Negative for congestion.   Eyes: Negative for visual disturbance.  Respiratory: Positive for chest tightness.   Gastrointestinal: Negative for abdominal pain,  nausea and vomiting.  Endocrine: Negative for cold intolerance.  Genitourinary: Negative for dysuria.  Musculoskeletal: Negative for myalgias.  Skin: Negative for rash.  Allergic/Immunologic: Negative for immunocompromised state.  Neurological: Negative for dizziness.  Hematological: Does not bruise/bleed easily.  Psychiatric/Behavioral: The patient is not nervous/anxious.   All other systems reviewed and are negative.      Objective: Blood pressure (!) 147/93, pulse (!) 109, height 5\' 4"  (1.626 m), weight 225 lb 8 oz (102.3 kg), SpO2 96 %.  Body mass index is 38.71 kg/m.    Physical Exam Vitals signs reviewed.  Constitutional:      General: She is not in acute distress.    Appearance: Normal appearance. She is obese.  HENT:     Head: Normocephalic.  Neck:     Musculoskeletal: Neck supple.     Thyroid: No thyromegaly.     Vascular: No carotid bruit.  Cardiovascular:     Rate and Rhythm: Normal rate and regular rhythm.     Pulses: Normal pulses.          Radial pulses are 2+ on the right side and 2+ on the left side.       Femoral pulses are 2+ on the right side and 2+ on the left side.      Popliteal pulses are 2+ on the right side and 2+ on the left side.       Dorsalis pedis pulses are 2+ on the right side and 2+ on the left side.       Posterior tibial pulses are 2+ on the right side and 2+ on the left side.     Heart sounds: Normal heart sounds, S1 normal and S2 normal. No murmur. No friction rub. No gallop.   Pulmonary:     Effort: Pulmonary effort is normal. No respiratory distress.     Breath sounds: Normal breath sounds. No wheezing, rhonchi or rales.  Abdominal:     General: Abdomen is protuberant. Bowel sounds are normal. There is no abdominal bruit.     Palpations: Abdomen is soft. There is no pulsatile mass.  Musculoskeletal:     Right lower leg: No edema.     Left lower leg: No edema.  Skin:    General: Skin is warm.  Neurological:     General: No focal  deficit present.     Mental Status: She is alert.        Assessment & Recommendations:   1. Chest pain suggestive of angina pectoris. EKG 02/32nd 2020: Normal sinus rhythm at rate of 95 bpm, left atrial enlargement, otherwise normal EKG. Variant angina is also in the differential.  3. Nonspecific abnormal electrocardiogram (ECG) (EKG) Patient had mild ST elevation in V1-V3. With no reciprocal  Change and do not suspect ACS or pericarditis. This could also represent focal pericarditis, however chest pain is not consistent with pericarditis.  4. Essential hypertension Not well controlled. Add Norvasc 5 mg for hypertension and also possible angina.  5. Dyslipidemia Results for CATHERYN, SLIFER (MRN [id number]) as of 04/02/2018 14:51  Ref. Range 03/28/2018 01:56  Total CHOL/HDL Ratio Latest Units: RATIO 2.7  Cholesterol Latest Ref Range: 0 - 200 mg/dL 162  HDL Cholesterol Latest Ref Range: >40 mg/dL 59  LDL (calc) Latest Ref Range: 0 - 99 mg/dL 82  Triglycerides Latest Ref Range: <150 mg/dL 106  VLDL Latest Ref Range: 0 - 40 mg/dL 21  Hemoglobin A1C Latest Ref Range: 4.8 - 5.6 % 5.9 (H)   6. Hyperglycemia. Weight loss discussed. Her labs reviewed.  Recommendation: Schedule for a Exercise Nuclear stress test to evaluate for myocardial ischemia. Echocardiogram for abnormal EKG. Primary prevention discussed. S/L NTG was prescribed and explained how to and when to use it and to notify us if there is change in frequency of use. Patient also has hyperglycemia as it is factor, also abdominal CT scan done in September 2019 had revealed calcified atherosclerotic plaque in the abdominal aorta.  Chest x-ray was clear. OV after  tests. This is a 60 minute face-to-face encounter, discussions regarding multiple cardiovascular risk factors, counseling regarding weight and hyperglycemia, evaluation of external records.  Adrian Prows, MD, [facility] 04/02/2018, 2:31 PM [facility]. [place] Pager:  [phone] Office: [phone] If no answer Cell [phone]

## 2018-04-08 ENCOUNTER — Ambulatory Visit: Payer: 59

## 2018-04-08 DIAGNOSIS — I208 Other forms of angina pectoris: Secondary | ICD-10-CM

## 2018-04-08 DIAGNOSIS — I200 Unstable angina: Secondary | ICD-10-CM | POA: Diagnosis not present

## 2018-04-10 ENCOUNTER — Ambulatory Visit (INDEPENDENT_AMBULATORY_CARE_PROVIDER_SITE_OTHER): Payer: 59 | Admitting: Family Medicine

## 2018-04-10 ENCOUNTER — Other Ambulatory Visit: Payer: Self-pay

## 2018-04-10 ENCOUNTER — Ambulatory Visit (INDEPENDENT_AMBULATORY_CARE_PROVIDER_SITE_OTHER): Payer: 59

## 2018-04-10 ENCOUNTER — Encounter: Payer: Self-pay | Admitting: Family Medicine

## 2018-04-10 VITALS — BP 128/72 | HR 105 | Temp 98.0°F | Resp 16 | Ht 64.0 in | Wt 225.0 lb

## 2018-04-10 DIAGNOSIS — R059 Cough, unspecified: Secondary | ICD-10-CM

## 2018-04-10 DIAGNOSIS — R05 Cough: Secondary | ICD-10-CM

## 2018-04-10 DIAGNOSIS — J069 Acute upper respiratory infection, unspecified: Secondary | ICD-10-CM | POA: Diagnosis not present

## 2018-04-10 DIAGNOSIS — R079 Chest pain, unspecified: Secondary | ICD-10-CM | POA: Diagnosis not present

## 2018-04-10 LAB — POCT CBC
Granulocyte percent: 55.3 %G (ref 37–80)
HCT, POC: 40.9 % (ref 29–41)
Hemoglobin: 13.7 g/dL (ref 11–14.6)
Lymph, poc: 2.3 (ref 0.6–3.4)
MCH, POC: 29.8 pg (ref 27–31.2)
MCHC: 33.5 g/dL (ref 31.8–35.4)
MCV: 89 fL (ref 76–111)
MID (cbc): 0.6 (ref 0–0.9)
MPV: 8.4 fL (ref 0–99.8)
POC Granulocyte: 3.6 (ref 2–6.9)
POC LYMPH PERCENT: 35.5 %L (ref 10–50)
POC MID %: 9.2 %M (ref 0–12)
Platelet Count, POC: 332 10*3/uL (ref 142–424)
RBC: 4.59 M/uL (ref 4.04–5.48)
RDW, POC: 12.9 %
WBC: 6.6 10*3/uL (ref 4.6–10.2)

## 2018-04-10 MED ORDER — GUAIFENESIN-CODEINE 100-10 MG/5ML PO SYRP: 5.0000 mL | ORAL_SOLUTION | Freq: Three times a day (TID) | ORAL | 0 refills | Status: DC | PRN

## 2018-04-10 MED ORDER — FLUTICASONE PROPIONATE 50 MCG/ACT NA SUSP: 1.0000 | Freq: Two times a day (BID) | NASAL | 6 refills | Status: DC

## 2018-04-10 MED ORDER — BENZONATATE 100 MG PO CAPS: 100.0000 mg | ORAL_CAPSULE | Freq: Three times a day (TID) | ORAL | 0 refills | Status: DC | PRN

## 2018-04-10 NOTE — Progress Notes (Signed)
2/21/202012:40 PM  [name] [birth year], 48 y.o. female [id number]  Chief Complaint  Patient presents with  . Cough    with chest tightness and bloody mucus  . Sinus Problem    drainage, congestion, sinus pressure     HPI:   Patient is a 48 y.o. female with past medical history significant for HTN, HLP, diverticulitis  who presents today for cough  Started 2 days ago Burning sensation in her nose Lost of PND Sore throat, itchy Lots of sneezing Watery eyes Pressure headache Cough, congestion, burning sensation in her chest No sob Coughing yellow mucous with small amount of blood x 1 Gets cold, chills, no fever Swabbed for flu yesterday - negative Works as CMA Has been taking Copy[employer] tabs, last one yesterday Used afrin x 1 Has h/o sinusitis but no surgeries Stopped smoking about 7 years ago   Fall Risk  04/10/2018 03/25/2018 08/15/2017 07/03/2017 02/05/2017  Falls in the past year? 0 0 No No No  Number falls in past yr: - 0 - - -  Injury with Fall? 0 0 - - -  Follow up - Falls evaluation completed - - -     Depression screen [facility] 2/9 04/10/2018 03/25/2018 08/15/2017  Decreased Interest 0 0 0  Down, Depressed, Hopeless 0 0 0  PHQ - 2 Score 0 0 0    Allergies  Allergen Reactions  . Keflex [Cephalexin] Anaphylaxis  . Penicillins Anaphylaxis    Has patient had a PCN reaction causing immediate rash, facial/tongue/throat swelling, SOB or lightheadedness with hypotension: Yes Has patient had a PCN reaction causing severe rash involving mucus membranes or skin necrosis: No Has patient had a PCN reaction that required hospitalization: No Has patient had a PCN reaction occurring within the last 10 years: No If all of the above answers are "NO", then may proceed with Cephalosporin use.     Prior to Admission medications   Medication Sig Start Date End Date Taking? Authorizing Provider  amLODipine (NORVASC) 5 MG tablet Take 1 tablet (5 mg total) by mouth daily.  04/02/18 07/01/18 Yes Adrian Prows, MD  atorvastatin (LIPITOR) 20 MG tablet Take 20 mg by mouth daily.   Yes [provider]  Black Cohosh (REMIFEMIN) 20 MG TABS Take 40 mg by mouth daily.   Yes [provider]  diclofenac sodium (VOLTAREN) 1 % GEL Apply 2 g topically 4 (four) times daily. 03/28/18  Yes Fuller Plan A, MD  ibuprofen (ADVIL,MOTRIN) 800 MG tablet Take 1 tablet (800 mg total) by mouth every 8 (eight) hours as needed. 03/25/18  Yes Wendie Agreste, MD  lisinopril (PRINIVIL,ZESTRIL) 10 MG tablet Take 1 tablet (10 mg total) by mouth daily. 08/15/17  Yes Rutherford Guys, MD  Multiple Vitamins-Minerals (HAIR/SKIN/NAILS/BIOTIN PO) Take 1 tablet by mouth daily.   Yes [provider]  nitroGLYCERIN (NITROSTAT) 0.4 MG SL tablet Place 1 tablet (0.4 mg total) under the tongue every 5 (five) minutes as needed for up to 25 days for chest pain. 04/02/18 04/27/18 Yes Adrian Prows, MD    Past Medical History:  Diagnosis Date  . Abnormal Pap smear 2000   [facility] Biopsy  . Allergy   . Back pain   . Breast fibroadenoma   . Diverticulitis of colon with perforation 05/20/2014  . GERD (gastroesophageal reflux disease)   . H/O metrorrhagia 12/2006  . H/O myomectomy 07-2006   robotic  . H/O sinusitis   . Headache(784.0)   . History of conization of  cervix 2000  . History of ectopic pregnancy 2005   r salpyngectomy  . History of hysterectomy, supracervical 08-2008   robotic  . History of syphilis   . History of syphilis   . Hx of adenomatous polyp of colon 12/31/2017  . Hx of herpes simplex type 2 infection   . Hyperlipidemia   . Hypertension   . Ileostomy present ([place]) 10/26/2014  . Intra-abdominal abscess ([place]) 07/21/2014  . PMS (premenstrual syndrome)     Past Surgical History:  Procedure Laterality Date  . ABDOMINAL HYSTERECTOMY    . BREAST LUMPECTOMY  10/11   rt-neg  . BREAST SURGERY  2004   fibroademoma  . CERVICAL CONE BIOPSY  2000  . COLON RESECTION N/A 07/06/2014     Procedure: LAPAROSCOPIC LOOP ILEOSTOMY WITH PLACEMENT OF PELVIC DRAIN;  Surgeon: Donnie Mesa, MD;  Location: [place];  Service: General;  Laterality: N/A;  . COLON SURGERY    . DILATION AND CURETTAGE OF UTERUS  2009  . DIVERTING ILEOSTOMY Right 06/2014  . ECTOPIC PREGNANCY SURGERY  2005   R salpyngectomy  . ILEOSTOMY N/A 10/26/2014   Procedure: LOOP ILEOSTOMY REVERSAL;  Surgeon: Donnie Mesa, MD;  Location: [place];  Service: General;  Laterality: N/A;  . LAPAROSCOPIC SIGMOID COLECTOMY N/A 06/30/2014   Procedure: LAPAROSCOPIC ASSISTED SIGMOID COLECTOMY;  Surgeon: Donnie Mesa, MD;  Location: [place];  Service: General;  Laterality: N/A;  . MYOMECTOMY  2008   robotic  . POLYPECTOMY  2009  . ROBOTIC ASSISTED LAP VAGINAL HYSTERECTOMY  2010   supracervical  . SALPINGECTOMY  2005  . TRIGGER FINGER RELEASE Right 09/28/2012   Procedure: RELEASE TRIGGER FINGER/A-1 PULLEY RIGHT THUMB;  Surgeon: Jolyn Nap, MD;  Location: [place];  Service: Orthopedics;  Laterality: Right;    Social History   Tobacco Use  . Smoking status: Former Smoker    Packs/day: 1.00    Years: 20.00    Pack years: 20.00    Types: Cigarettes    Last attempt to quit: 06/13/2011    Years since quitting: 6.8  . Smokeless tobacco: Never Used  Substance Use Topics  . Alcohol use: Not Currently    Family History  Problem Relation Age of Onset  . Hypertension Paternal Grandmother   . Diabetes Paternal Grandmother   . Hypertension Maternal Grandmother   . Sarcoidosis Mother   . Hypertension Mother   . Hyperlipidemia Mother   . Thyroid disease Maternal Aunt   . Mental illness Cousin   . Fibroids Sister     ROS Per hpi  OBJECTIVE:  Blood pressure 128/72, pulse (!) 105, temperature 98 F (36.7 C), temperature source Oral, resp. rate 16, height 5\' 4"  (1.626 m), weight 225 lb (102.1 kg), SpO2 95 %. Body mass index is 38.62 kg/m.   Physical Exam Vitals signs and nursing note reviewed.   Constitutional:      Appearance: She is well-developed.  HENT:     Head: Normocephalic and atraumatic.     Right Ear: Hearing, tympanic membrane, ear canal and external ear normal.     Left Ear: Hearing, tympanic membrane, ear canal and external ear normal.     Nose:     Right Turbinates: Swollen and pale.     Left Turbinates: Swollen and pale.     Right Sinus: No maxillary sinus tenderness or frontal sinus tenderness.     Left Sinus: No maxillary sinus tenderness or frontal sinus tenderness.     Mouth/Throat:  Pharynx: No posterior oropharyngeal erythema.     Tonsils: No tonsillar exudate. Swelling: 3+ on the right. 3+ on the left.  Eyes:     Conjunctiva/sclera: Conjunctivae normal.     Pupils: Pupils are equal, round, and reactive to light.  Neck:     Musculoskeletal: Neck supple.  Cardiovascular:     Rate and Rhythm: Normal rate and regular rhythm.     Heart sounds: Normal heart sounds. No murmur. No friction rub. No gallop.   Pulmonary:     Effort: Pulmonary effort is normal.     Breath sounds: Normal breath sounds. No wheezing or rales.  Lymphadenopathy:     Cervical: No cervical adenopathy.  Skin:    General: Skin is warm and dry.  Neurological:     Mental Status: She is alert and oriented to person, place, and time.     Results for orders placed or performed in visit on 04/10/18 (from the past 24 hour(s))  POCT CBC     Status: None   Collection Time: 04/10/18 12:44 PM  Result Value Ref Range   WBC 6.6 4.6 - 10.2 K/uL   Lymph, poc 2.3 0.6 - 3.4   POC LYMPH PERCENT 35.5 10 - 50 %L   MID (cbc) 0.6 0 - 0.9   POC MID % 9.2 0 - 12 %M   POC Granulocyte 3.6 2 - 6.9   Granulocyte percent 55.3 37 - 80 %G   RBC 4.59 4.04 - 5.48 M/uL   Hemoglobin 13.7 11 - 14.6 g/dL   HCT, POC 40.9 29 - 41 %   MCV 89.0 76 - 111 fL   MCH, POC 29.8 27 - 31.2 pg   MCHC 33.5 31.8 - 35.4 g/dL   RDW, POC 12.9 %   Platelet Count, POC 332 142 - 424 K/uL   MPV 8.4 0 - 99.8 fL    Dg  Chest 2 View  Result Date: 04/10/2018 CLINICAL DATA:  Productive cough. EXAM: CHEST - 2 VIEW COMPARISON:  Radiographs of March 27, 2018. FINDINGS: The heart size and mediastinal contours are within normal limits. Both lungs are clear. The visualized skeletal structures are unremarkable. IMPRESSION: No active cardiopulmonary disease. Electronically Signed   By: Marijo Conception, M.D.   On: 04/10/2018 12:34     ASSESSMENT and PLAN  1. Acute upper respiratory infection Discussed supportive measures, new meds r/se/b and RTC precautions. Patient educational handout given.  2. Cough - POCT CBC - DG Chest 2 View; Future  Other orders - fluticasone (FLONASE) 50 MCG/ACT nasal spray; Place 1 spray into both nostrils 2 (two) times daily. - benzonatate (TESSALON) 100 MG capsule; Take 1-2 capsules (100-200 mg total) by mouth 3 (three) times daily as needed for cough. - guaiFENesin-codeine (ROBITUSSIN AC) 100-10 MG/5ML syrup; Take 5 mLs by mouth 3 (three) times daily as needed for cough.    Return if symptoms worsen or fail to improve.    Rutherford Guys, MD Primary Care at [facility] [place], [place] [address] Ph.  [phone] Fax [phone]

## 2018-04-10 NOTE — Patient Instructions (Signed)
° ° ° °  If you have lab work done today you will be contacted with your lab results within the next 2 weeks.  If you have not heard from us then please contact us. The fastest way to get your results is to register for My Chart. ° ° °IF you received an x-ray today, you will receive an invoice from [facility]. Please contact [facility] at [phone] with questions or concerns regarding your invoice.  ° °IF you received labwork today, you will receive an invoice from [facility]. Please contact [facility] at [phone] with questions or concerns regarding your invoice.  ° °Our billing staff will not be able to assist you with questions regarding bills from these companies. ° °You will be contacted with the lab results as soon as they are available. The fastest way to get your results is to activate your My Chart account. Instructions are located on the last page of this paperwork. If you have not heard from us regarding the results in 2 weeks, please contact this office. °  ° ° ° °

## 2018-04-15 ENCOUNTER — Other Ambulatory Visit: Payer: Self-pay | Admitting: Family Medicine

## 2018-04-16 ENCOUNTER — Ambulatory Visit: Payer: 59

## 2018-04-16 DIAGNOSIS — I208 Other forms of angina pectoris: Secondary | ICD-10-CM

## 2018-04-17 MED ORDER — GUAIFENESIN-CODEINE 100-10 MG/5ML PO SYRP: 5.0000 mL | ORAL_SOLUTION | Freq: Three times a day (TID) | ORAL | 0 refills | Status: DC | PRN

## 2018-04-18 ENCOUNTER — Other Ambulatory Visit: Payer: Self-pay

## 2018-04-18 ENCOUNTER — Emergency Department (HOSPITAL_COMMUNITY): Payer: 59

## 2018-04-18 ENCOUNTER — Encounter (HOSPITAL_COMMUNITY): Payer: Self-pay

## 2018-04-18 ENCOUNTER — Emergency Department (HOSPITAL_COMMUNITY)
Admission: EM | Admit: 2018-04-18 | Discharge: 2018-04-18 | Disposition: A | Discharge status: Home / Self Care | Payer: 59 | Attending: Emergency Medicine | Admitting: Emergency Medicine

## 2018-04-18 DIAGNOSIS — Z87891 Personal history of nicotine dependence: Secondary | ICD-10-CM | POA: Insufficient documentation

## 2018-04-18 DIAGNOSIS — J111 Influenza due to unidentified influenza virus with other respiratory manifestations: Secondary | ICD-10-CM | POA: Diagnosis not present

## 2018-04-18 DIAGNOSIS — Z79899 Other long term (current) drug therapy: Secondary | ICD-10-CM | POA: Diagnosis not present

## 2018-04-18 DIAGNOSIS — R69 Illness, unspecified: Secondary | ICD-10-CM

## 2018-04-18 DIAGNOSIS — R05 Cough: Secondary | ICD-10-CM | POA: Diagnosis not present

## 2018-04-18 DIAGNOSIS — R509 Fever, unspecified: Secondary | ICD-10-CM | POA: Diagnosis present

## 2018-04-18 DIAGNOSIS — I10 Essential (primary) hypertension: Secondary | ICD-10-CM | POA: Diagnosis not present

## 2018-04-18 MED ORDER — HYDROCOD POLST-CPM POLST ER 10-8 MG/5ML PO SUER
5.0000 mL | Freq: Once | ORAL | Status: AC
  Administered 2018-04-18: 5 mL via ORAL
  Filled 2018-04-18: qty 5

## 2018-04-18 MED ORDER — ACETAMINOPHEN 325 MG PO TABS
650.0000 mg | ORAL_TABLET | Freq: Once | ORAL | Status: AC
  Administered 2018-04-18: 650 mg via ORAL
  Filled 2018-04-18: qty 2

## 2018-04-18 MED ORDER — DEXTROMETHORPHAN HBR 15 MG/5ML PO SYRP: 10.0000 mL | ORAL_SOLUTION | Freq: Four times a day (QID) | ORAL | 0 refills | Status: DC | PRN

## 2018-04-18 NOTE — ED Provider Notes (Signed)
[facility] DEPT Provider Note   CSN: [id number] Arrival date & time: 04/18/18  1750    History   Chief Complaint Chief Complaint  Patient presents with  . Fever  . Cough  . Generalized Body Aches    HPI [name] is a 48 y.o. female history of hypertension, hyperlipidemia presenting to emergency department today with chief complaint of flulike symptoms. Patient reports acute onset, starting 12 hours ago.  She states that she felt warm so she checked her temperature and it was 100.1.  She later was feeling chills and generalized body aches.  She did not take any medications for symptoms prior to arrival.  Patient admits to sick contacts.  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 Patient also reports cough x2-week.  She was seen by her PCP 1 week ago and given prescription for Tessalon Perles and cough medicine.  She took them without relief.  The cough started as nonproductive however over the last several days she is noticed it is productive with thick yellow sputum. Denies nausea, vomiting abdominal pain, chest pain, shortness of breath.      Past Medical History:  Diagnosis Date  . Abnormal Pap smear 2000   [facility] Biopsy  . Allergy   . Back pain   . Breast fibroadenoma   . Diverticulitis of colon with perforation 05/20/2014  . GERD (gastroesophageal reflux disease)   . H/O metrorrhagia 12/2006  . H/O myomectomy 07-2006   robotic  . H/O sinusitis   . Headache(784.0)   . History of conization of cervix 2000  . History of ectopic pregnancy 2005   r salpyngectomy  . History of hysterectomy, supracervical 08-2008   robotic  . History of syphilis   . History of syphilis   . Hx of adenomatous polyp of colon 12/31/2017  . Hx of herpes simplex type 2 infection   . Hyperlipidemia   . Hypertension   . Ileostomy present ([place]) 10/26/2014  . Intra-abdominal abscess ([place]) 07/21/2014  . PMS (premenstrual syndrome)     Patient Active Problem  List   Diagnosis Date Noted  . Obesity (BMI 30-39.9) 03/29/2018  . Acute chest pain 03/27/2018  . Nonspecific abnormal electrocardiogram (ECG) (EKG) 03/27/2018  . Atypical chest pain 03/27/2018  . Hand pain, right 03/25/2018  . Dyslipidemia 08/15/2017  . History of hysterectomy, supracervical 02/14/2017  . Trigger ring finger of right hand 08/07/2015  . Primary osteoarthritis of first carpometacarpal joint of right hand 08/07/2015  . Diverticulitis of sigmoid colon s/p colectomy 06/30/2014 07/26/2014  . Tachycardia 02/04/2014  . Hx of adenomatous polyp of colon 12/23/2013  . Essential hypertension 11/02/2013  . Former smoker 11/26/2008    Past Surgical History:  Procedure Laterality Date  . ABDOMINAL HYSTERECTOMY    . BREAST LUMPECTOMY  10/11   rt-neg  . BREAST SURGERY  2004   fibroademoma  . CERVICAL CONE BIOPSY  2000  . COLON RESECTION N/A 07/06/2014   Procedure: LAPAROSCOPIC LOOP ILEOSTOMY WITH PLACEMENT OF PELVIC DRAIN;  Surgeon: Donnie Mesa, MD;  Location: [facility];  Service: General;  Laterality: N/A;  . COLON SURGERY    . DILATION AND CURETTAGE OF UTERUS  2009  . DIVERTING ILEOSTOMY Right 06/2014  . ECTOPIC PREGNANCY SURGERY  2005   R salpyngectomy  . ILEOSTOMY N/A 10/26/2014   Procedure: LOOP ILEOSTOMY REVERSAL;  Surgeon: Donnie Mesa, MD;  Location: [facility];  Service: General;  Laterality: N/A;  . LAPAROSCOPIC SIGMOID COLECTOMY N/A 06/30/2014  Procedure: LAPAROSCOPIC ASSISTED SIGMOID COLECTOMY;  Surgeon: Donnie Mesa, MD;  Location: [facility];  Service: General;  Laterality: N/A;  . MYOMECTOMY  2008   robotic  . POLYPECTOMY  2009  . ROBOTIC ASSISTED LAP VAGINAL HYSTERECTOMY  2010   supracervical  . SALPINGECTOMY  2005  . TRIGGER FINGER RELEASE Right 09/28/2012   Procedure: RELEASE TRIGGER FINGER/A-1 PULLEY RIGHT THUMB;  Surgeon: Jolyn Nap, MD;  Location: [facility];  Service: Orthopedics;  Laterality: Right;     OB History    Gravida  4   Para  2    Term  2   Preterm      AB  2   Living  2     SAB  0   TAB  1   Ectopic  1   Multiple  0   Live Births               Home Medications    Prior to Admission medications   Medication Sig Start Date End Date Taking? Authorizing Provider  amLODipine (NORVASC) 5 MG tablet Take 1 tablet (5 mg total) by mouth daily. 04/02/18 07/01/18  Adrian Prows, MD  atorvastatin (LIPITOR) 20 MG tablet Take 20 mg by mouth daily.    [provider]  benzonatate (TESSALON) 100 MG capsule Take 1-2 capsules (100-200 mg total) by mouth 3 (three) times daily as needed for cough. 04/10/18   Rutherford Guys, MD  Black Cohosh (REMIFEMIN) 20 MG TABS Take 40 mg by mouth daily.    [provider]  dextromethorphan 15 MG/5ML syrup Take 10 mLs (30 mg total) by mouth 4 (four) times daily as needed for cough. 04/18/18   Fonnie Crookshanks E, PA-C  diclofenac sodium (VOLTAREN) 1 % GEL Apply 2 g topically 4 (four) times daily. 03/28/18   Norval Morton, MD  fluticasone (FLONASE) 50 MCG/ACT nasal spray Place 1 spray into both nostrils 2 (two) times daily. 04/10/18   Rutherford Guys, MD  guaiFENesin-codeine [facility]) 100-10 MG/5ML syrup Take 5 mLs by mouth 3 (three) times daily as needed for cough. 04/17/18   Rutherford Guys, MD  ibuprofen (ADVIL,MOTRIN) 800 MG tablet Take 1 tablet (800 mg total) by mouth every 8 (eight) hours as needed. 03/25/18   Wendie Agreste, MD  lisinopril (PRINIVIL,ZESTRIL) 10 MG tablet Take 1 tablet (10 mg total) by mouth daily. 08/15/17   Rutherford Guys, MD  Multiple Vitamins-Minerals (HAIR/SKIN/NAILS/BIOTIN PO) Take 1 tablet by mouth daily.    [provider]  nitroGLYCERIN (NITROSTAT) 0.4 MG SL tablet Place 1 tablet (0.4 mg total) under the tongue every 5 (five) minutes as needed for up to 25 days for chest pain. 04/02/18 04/27/18  Adrian Prows, MD    Family History Family History  Problem Relation Age of Onset  . Hypertension Paternal Grandmother   .  Diabetes Paternal Grandmother   . Hypertension Maternal Grandmother   . Sarcoidosis Mother   . Hypertension Mother   . Hyperlipidemia Mother   . Thyroid disease Maternal Aunt   . Mental illness Cousin   . Fibroids Sister     Social History Social History   Tobacco Use  . Smoking status: Former Smoker    Packs/day: 1.00    Years: 20.00    Pack years: 20.00    Types: Cigarettes    Last attempt to quit: 06/13/2011    Years since quitting: 6.8  . Smokeless tobacco: Never Used  Substance Use  Topics  . Alcohol use: Not Currently  . Drug use: No     Allergies   Keflex [cephalexin] and Penicillins   Review of Systems Review of Systems  Constitutional: Positive for chills and fever.  HENT: Positive for congestion and sore throat. Negative for ear pain and sinus pain.   Eyes: Negative for pain and discharge.  Respiratory: Positive for cough.   Cardiovascular: Negative for chest pain.  Gastrointestinal: Negative for abdominal pain, nausea and vomiting.  Musculoskeletal: Positive for myalgias.  All other systems reviewed and are negative.    Physical Exam Updated Vital Signs BP (!) 153/91 (BP Location: Left Arm)   Pulse (!) 108   Temp 99.8 F (37.7 C) (Oral)   Resp 18   Ht 5\' 4"  (1.626 m)   Wt 101.2 kg   SpO2 96%   BMI 38.28 kg/m   Physical Exam Vitals signs and nursing note reviewed.  Constitutional:      Appearance: She is well-developed. She is not toxic-appearing.  HENT:     Head: Normocephalic and atraumatic.     Comments: Nontender to palpation over frontal and maxillary sinuses    Right Ear: Tympanic membrane and external ear normal.     Left Ear: Tympanic membrane and external ear normal.     Nose: Congestion present.     Mouth/Throat:     Mouth: Mucous membranes are dry.     Tonsils: Swelling: 0 on the right. 0 on the left.     Comments: Minor erythema to oropharynx, no edema, no exudate, no tonsillar swelling, voice normal, neck supple without  lymphadenopathy  Eyes:     General: No scleral icterus.       Right eye: No discharge.        Left eye: No discharge.     Conjunctiva/sclera: Conjunctivae normal.  Neck:     Musculoskeletal: Normal range of motion.  Cardiovascular:     Rate and Rhythm: Regular rhythm. Tachycardia present.     Pulses: Normal pulses.     Heart sounds: Normal heart sounds.  Pulmonary:     Effort: Pulmonary effort is normal.     Breath sounds: Normal breath sounds.  Abdominal:     General: There is no distension.  Musculoskeletal: Normal range of motion.  Skin:    General: Skin is warm and dry.     Findings: No rash.  Neurological:     Mental Status: She is oriented to person, place, and time.     Comments: Fluent speech, no facial droop.  Psychiatric:        Behavior: Behavior normal.      ED Treatments / Results  Labs (all labs ordered are listed, but only abnormal results are displayed) Labs Reviewed - No data to display  EKG None  Radiology Dg Chest 2 View  Result Date: 04/18/2018 CLINICAL DATA:  Cough for 2 weeks. EXAM: CHEST - 2 VIEW COMPARISON:  04/10/2018 and prior radiographs FINDINGS: The cardiomediastinal silhouette is unremarkable. There is no evidence of focal airspace disease, pulmonary edema, suspicious pulmonary nodule/mass, pleural effusion, or pneumothorax. No acute bony abnormalities are identified. IMPRESSION: No active cardiopulmonary disease. Electronically Signed   By: Margarette Canada M.D.   On: 04/18/2018 20:02    Procedures Procedures (including critical care time)  Medications Ordered in ED Medications  acetaminophen (TYLENOL) tablet 650 mg (650 mg Oral Given 04/18/18 2053)  chlorpheniramine-HYDROcodone (TUSSIONEX) 10-8 MG/5ML suspension 5 mL (5 mLs Oral Given 04/18/18 2053)  Initial Impression / Assessment and Plan / ED Course  I have reviewed the triage vital signs and the nursing notes.  Pertinent labs & imaging results that were available during my care  of the patient were reviewed by me and considered in my medical decision making (see chart for details).    Patient with symptoms consistent with influenza.  Given she has had a cough for 2 weeks chest x-ray was ordered and viewed by me.  I do not see any signs of acute infectious process.   Patient has low-grade fever.  No signs of dehydration, tolerating PO's.  Lungs are clear.   Discussed the risk versus benefit of Tamiflu treatment with the patient.  The patient understands that symptoms are greater than the recommended 24-48 hour window of treatment. Patient given Tylenol for fever.  She states she did not take her blood pressure medications today because she forgot.  Patient was tachycardic and febrile at at discharge.  I recommended she should stay longer for observation, p.o. fluids, and a dose of ibuprofen.  Patient states she just wanted to know if she had pneumonia.  She is requesting to be discharged and reports she will take ibuprofen when she gets home. Patient will be discharged with instructions to orally hydrate, rest, and use over-the-counter medications such as anti-inflammatories ibuprofen and Aleve for muscle aches and Tylenol for fever.  Patient will also be given a cough suppressant.  Recommend PCP follow-up within 1 week for further evaluation.  Strict ED return precautions discussed.  Patient is in agreement with this plan and has no further questions.  This note was prepared with assistance of Systems analyst. Occasional wrong-word or sound-a-like substitutions may have occurred due to the inherent limitations of voice recognition software.       Final Clinical Impressions(s) / ED Diagnoses   Final diagnoses:  Influenza-like illness    ED Discharge Orders         Ordered    dextromethorphan 15 MG/5ML syrup  4 times daily PRN     04/18/18 2051           Flint Melter 04/18/18 2154    Pattricia Boss, MD 04/30/18 1708

## 2018-04-18 NOTE — ED Triage Notes (Signed)
Patient c/o fever, chills, body aches and a productive cough with thick yellow sputum, and nasal congestion.

## 2018-04-18 NOTE — Discharge Instructions (Signed)
You have the flu this is a viral infection that will likely start to improve after 5-7 days, antibiotics are not helpful in treating viral infections.  Since your symptoms have been present for more than 2 days Tamiflu will give no additional benefit.  Please make sure you are drinking plenty of fluids. You can treat your symptoms supportively with tylenol/ibuprofen for fevers and pains, Zyrtec and Flonase to heal with nasal congestion, and throat lozenges to help with cough. If your symptoms are not improving please follow up with you Primary doctor.   If you develop persistent fevers, shortness of breath or difficulty breathing, chest pain, severe headache and neck pain, persistent nausea and vomiting or other new or concerning symptoms return to the Emergency department.

## 2018-04-20 ENCOUNTER — Other Ambulatory Visit: Payer: Self-pay

## 2018-04-20 ENCOUNTER — Encounter (HOSPITAL_COMMUNITY): Payer: Self-pay

## 2018-04-20 DIAGNOSIS — J111 Influenza due to unidentified influenza virus with other respiratory manifestations: Secondary | ICD-10-CM | POA: Insufficient documentation

## 2018-04-20 DIAGNOSIS — Z79899 Other long term (current) drug therapy: Secondary | ICD-10-CM | POA: Diagnosis not present

## 2018-04-20 DIAGNOSIS — R509 Fever, unspecified: Secondary | ICD-10-CM | POA: Diagnosis not present

## 2018-04-20 DIAGNOSIS — R110 Nausea: Secondary | ICD-10-CM | POA: Diagnosis not present

## 2018-04-20 DIAGNOSIS — Z932 Ileostomy status: Secondary | ICD-10-CM | POA: Insufficient documentation

## 2018-04-20 DIAGNOSIS — Z87891 Personal history of nicotine dependence: Secondary | ICD-10-CM | POA: Diagnosis not present

## 2018-04-20 DIAGNOSIS — I10 Essential (primary) hypertension: Secondary | ICD-10-CM | POA: Insufficient documentation

## 2018-04-20 DIAGNOSIS — R05 Cough: Secondary | ICD-10-CM | POA: Diagnosis not present

## 2018-04-20 NOTE — ED Triage Notes (Signed)
Pt complains of a productive cough and intermittent fevers since Saturday, she was seen then and received meds to take at home, her primary doctor also gave her medicine to take Pt states that her cough is worse and she continues to have fevers

## 2018-04-21 ENCOUNTER — Emergency Department (HOSPITAL_COMMUNITY): Payer: 59

## 2018-04-21 ENCOUNTER — Emergency Department (HOSPITAL_COMMUNITY)
Admission: EM | Admit: 2018-04-21 | Discharge: 2018-04-21 | Disposition: A | Discharge status: Home / Self Care | Payer: 59 | Attending: Emergency Medicine | Admitting: Emergency Medicine

## 2018-04-21 DIAGNOSIS — Z932 Ileostomy status: Secondary | ICD-10-CM | POA: Diagnosis not present

## 2018-04-21 DIAGNOSIS — R05 Cough: Secondary | ICD-10-CM

## 2018-04-21 DIAGNOSIS — Z87891 Personal history of nicotine dependence: Secondary | ICD-10-CM | POA: Diagnosis not present

## 2018-04-21 DIAGNOSIS — J111 Influenza due to unidentified influenza virus with other respiratory manifestations: Secondary | ICD-10-CM | POA: Diagnosis not present

## 2018-04-21 DIAGNOSIS — R69 Illness, unspecified: Secondary | ICD-10-CM

## 2018-04-21 DIAGNOSIS — I10 Essential (primary) hypertension: Secondary | ICD-10-CM | POA: Diagnosis not present

## 2018-04-21 DIAGNOSIS — Z79899 Other long term (current) drug therapy: Secondary | ICD-10-CM | POA: Diagnosis not present

## 2018-04-21 DIAGNOSIS — R059 Cough, unspecified: Secondary | ICD-10-CM

## 2018-04-21 MED ORDER — ALBUTEROL SULFATE (2.5 MG/3ML) 0.083% IN NEBU
5.0000 mg | INHALATION_SOLUTION | Freq: Once | RESPIRATORY_TRACT | Status: AC
  Administered 2018-04-21: 5 mg via RESPIRATORY_TRACT
  Filled 2018-04-21: qty 6

## 2018-04-21 MED ORDER — ALBUTEROL SULFATE HFA 108 (90 BASE) MCG/ACT IN AERS
2.0000 | INHALATION_SPRAY | Freq: Once | RESPIRATORY_TRACT | Status: AC
  Administered 2018-04-21: 2 via RESPIRATORY_TRACT
  Filled 2018-04-21: qty 6.7

## 2018-04-21 MED ORDER — GUAIFENESIN-CODEINE 100-10 MG/5ML PO SOLN: 10.0000 mL | Freq: Three times a day (TID) | ORAL | 0 refills | Status: DC | PRN

## 2018-04-21 MED ORDER — GUAIFENESIN-CODEINE 100-10 MG/5ML PO SOLN
10.0000 mL | Freq: Once | ORAL | Status: AC
  Administered 2018-04-21: 10 mL via ORAL
  Filled 2018-04-21: qty 10

## 2018-04-21 NOTE — ED Provider Notes (Signed)
[facility] DEPT Provider Note   CSN: [id number] Arrival date & time: 04/20/18  1944    History   Chief Complaint Chief Complaint  Patient presents with  . flu like sx    HPI [name] is a 48 y.o. female.     [name] is a 48 y.o. female  with with a history of hypertension, hyperlipidemia, GERD, allergies, who presents to the emergency department for evaluation of persistent productive cough.  Patient was seen in the emergency department 3 days ago and diagnosed with likely influenza.  Patient reports that she has been having productive cough, fevers, chills, body aches, nasal congestion and sore throat.  She reports that despite cough medications prescribed to her from the emergency department and from her primary care doctor she continues to feel unwell and continues to cough.  She has been taking Tylenol and ibuprofen but reports she is feeling worse not better.  She continues to have fevers up to 102. She denies CP or SOB. She reports some nausea, but no vomiting. No other aggravating or alleviating factors.     Past Medical History:  Diagnosis Date  . Abnormal Pap smear 2000   [facility] Bi[facility]  . Allergy   . Back pain   . Breast fibroadenoma   . Diverticulitis of colon with perforation 05/20/2014  . GERD (gastroesophageal reflux disease)   . H/O metrorrhagia 12/2006  . H/O myomectomy 07-2006   robotic  . H/O sinusitis   . Headache(784.0)   . History of conization of cervix 2000  . History of ectopic pregnancy 2005   r salpyngectomy  . History of hysterectomy, supracervical 08-2008   robotic  . History of syphilis   . History of syphilis   . Hx of adenomatous polyp of colon 12/31/2017  . Hx of herpes simplex type 2 infection   . Hyperlipidemia   . Hypertension   . Ileostomy present ([place]) 10/26/2014  . Intra-abdominal abscess ([place]) 07/21/2014  . PMS (premenstrual syndrome)     Patient Active Problem List   Diagnosis Date  Noted  . Obesity (BMI 30-39.9) 03/29/2018  . Acute chest pain 03/27/2018  . Nonspecific abnormal electrocardiogram (ECG) (EKG) 03/27/2018  . Atypical chest pain 03/27/2018  . Hand pain, right 03/25/2018  . Dyslipidemia 08/15/2017  . History of hysterectomy, supracervical 02/14/2017  . Trigger ring finger of right hand 08/07/2015  . Primary osteoarthritis of first carpometacarpal joint of right hand 08/07/2015  . Diverticulitis of sigmoid colon s/p colectomy 06/30/2014 07/26/2014  . Tachycardia 02/04/2014  . Hx of adenomatous polyp of colon 12/23/2013  . Essential hypertension 11/02/2013  . Former smoker 11/26/2008    Past Surgical History:  Procedure Laterality Date  . ABDOMINAL HYSTERECTOMY    . BREAST LUMPECTOMY  10/11   rt-neg  . BREAST SURGERY  2004   fibroademoma  . CERVICAL CONE BIOPSY  2000  . COLON RESECTION N/A 07/06/2014   Procedure: LAPAROSCOPIC LOOP ILEOSTOMY WITH PLACEMENT OF PELVIC DRAIN;  Surgeon: Donnie Mesa, MD;  Location: [facility];  Service: General;  Laterality: N/A;  . COLON SURGERY    . DILATION AND CURETTAGE OF UTERUS  2009  . DIVERTING ILEOSTOMY Right 06/2014  . ECTOPIC PREGNANCY SURGERY  2005   R salpyngectomy  . ILEOSTOMY N/A 10/26/2014   Procedure: LOOP ILEOSTOMY REVERSAL;  Surgeon: Donnie Mesa, MD;  Location: [facility];  Service: General;  Laterality: N/A;  . LAPAROSCOPIC SIGMOID COLECTOMY N/A 06/30/2014   Procedure: LAPAROSCOPIC ASSISTED SIGMOID COLECTOMY;  Surgeon: Donnie Mesa, MD;  Location: [facility];  Service: General;  Laterality: N/A;  . MYOMECTOMY  2008   robotic  . POLYPECTOMY  2009  . ROBOTIC ASSISTED LAP VAGINAL HYSTERECTOMY  2010   supracervical  . SALPINGECTOMY  2005  . TRIGGER FINGER RELEASE Right 09/28/2012   Procedure: RELEASE TRIGGER FINGER/A-1 PULLEY RIGHT THUMB;  Surgeon: Jolyn Nap, MD;  Location: [facility];  Service: Orthopedics;  Laterality: Right;     OB History    Gravida  4   Para  2   Term  2   Preterm       AB  2   Living  2     SAB  0   TAB  1   Ectopic  1   Multiple  0   Live Births               Home Medications    Prior to Admission medications   Medication Sig Start Date End Date Taking? Authorizing Provider  amLODipine (NORVASC) 5 MG tablet Take 1 tablet (5 mg total) by mouth daily. 04/02/18 07/01/18  Adrian Prows, MD  atorvastatin (LIPITOR) 20 MG tablet Take 20 mg by mouth daily.    [provider]  benzonatate (TESSALON) 100 MG capsule Take 1-2 capsules (100-200 mg total) by mouth 3 (three) times daily as needed for cough. 04/10/18   Rutherford Guys, MD  Black Cohosh (REMIFEMIN) 20 MG TABS Take 40 mg by mouth daily.    [provider]  dextromethorphan 15 MG/5ML syrup Take 10 mLs (30 mg total) by mouth 4 (four) times daily as needed for cough. 04/18/18   Albrizze, Kaitlyn E, PA-C  diclofenac sodium (VOLTAREN) 1 % GEL Apply 2 g topically 4 (four) times daily. 03/28/18   Norval Morton, MD  fluticasone (FLONASE) 50 MCG/ACT nasal spray Place 1 spray into both nostrils 2 (two) times daily. 04/10/18   Rutherford Guys, MD  guaiFENesin-codeine 100-10 MG/5ML syrup Take 10 mLs by mouth 3 (three) times daily as needed for cough. 04/21/18   Jacqlyn Larsen, PA-C  ibuprofen (ADVIL,MOTRIN) 800 MG tablet Take 1 tablet (800 mg total) by mouth every 8 (eight) hours as needed. 03/25/18   Wendie Agreste, MD  lisinopril (PRINIVIL,ZESTRIL) 10 MG tablet Take 1 tablet (10 mg total) by mouth daily. 08/15/17   Rutherford Guys, MD  Multiple Vitamins-Minerals (HAIR/SKIN/NAILS/BIOTIN PO) Take 1 tablet by mouth daily.    [provider]  nitroGLYCERIN (NITROSTAT) 0.4 MG SL tablet Place 1 tablet (0.4 mg total) under the tongue every 5 (five) minutes as needed for up to 25 days for chest pain. 04/02/18 04/27/18  Adrian Prows, MD    Family History Family History  Problem Relation Age of Onset  . Hypertension Paternal Grandmother   . Diabetes Paternal Grandmother   .  Hypertension Maternal Grandmother   . Sarcoidosis Mother   . Hypertension Mother   . Hyperlipidemia Mother   . Thyroid disease Maternal Aunt   . Mental illness Cousin   . Fibroids Sister     Social History Social History   Tobacco Use  . Smoking status: Former Smoker    Packs/day: 1.00    Years: 20.00    Pack years: 20.00    Types: Cigarettes    Last attempt to quit: 06/13/2011    Years since quitting: 6.8  . Smokeless tobacco: Never Used  Substance Use Topics  . Alcohol use: Not Currently  .  Drug use: No     Allergies   Keflex [cephalexin] and Penicillins   Review of Systems Review of Systems  Constitutional: Positive for chills and fever.  HENT: Positive for congestion, postnasal drip, rhinorrhea, sinus pressure and sore throat. Negative for ear pain.   Eyes: Negative for visual disturbance.  Respiratory: Positive for cough. Negative for chest tightness, shortness of breath and wheezing.   Cardiovascular: Negative for chest pain.  Gastrointestinal: Positive for nausea. Negative for abdominal pain and vomiting.  Genitourinary: Negative for dysuria and frequency.  Musculoskeletal: Positive for myalgias. Negative for arthralgias, neck pain and neck stiffness.  Skin: Negative for color change and rash.  Neurological: Negative for dizziness, syncope, light-headedness and headaches.     Physical Exam Updated Vital Signs BP (!) 119/95 (BP Location: Right Arm)   Pulse 98   Temp 98.9 F (37.2 C) (Oral)   Resp (!) 21   Ht 5\' 4"  (1.626 m)   Wt 102.1 kg   SpO2 97%   BMI 38.62 kg/m   Physical Exam Vitals signs and nursing note reviewed.  Constitutional:      General: She is not in acute distress.    Appearance: Normal appearance. She is well-developed. She is obese. She is not ill-appearing or diaphoretic.  HENT:     Head: Normocephalic and atraumatic.     Right Ear: Tympanic membrane and ear canal normal.     Left Ear: Tympanic membrane and ear canal normal.      Nose: Congestion and rhinorrhea present.     Comments: Bilateral nares patent with moderate mucosal edema and clear rhinorrhea present.     Mouth/Throat:     Mouth: Mucous membranes are moist.     Pharynx: Oropharynx is clear. Posterior oropharyngeal erythema present.     Comments: Posterior oropharynx clear and mucous membranes moist, there is mild erythema but no edema or tonsillar exudates, uvula midline, normal phonation, no trismus, tolerating secretions without difficulty. Eyes:     General:        Right eye: No discharge.        Left eye: No discharge.  Neck:     Musculoskeletal: Neck supple.     Comments: No rigidity Cardiovascular:     Rate and Rhythm: Normal rate and regular rhythm.     Pulses: Normal pulses.     Heart sounds: Normal heart sounds.  Pulmonary:     Effort: Pulmonary effort is normal. No respiratory distress.     Breath sounds: Normal breath sounds.     Comments: Respirations equal and unlabored, patient able to speak in full sentences, lungs clear to auscultation bilaterally Abdominal:     General: Bowel sounds are normal. There is no distension.     Palpations: Abdomen is soft. There is no mass.     Tenderness: There is no abdominal tenderness. There is no guarding.     Comments: Abdomen soft, nondistended, nontender to palpation in all quadrants without guarding or peritoneal signs  Musculoskeletal:        General: No deformity.  Lymphadenopathy:     Cervical: No cervical adenopathy.  Skin:    General: Skin is warm and dry.     Capillary Refill: Capillary refill takes less than 2 seconds.  Neurological:     Mental Status: She is alert and oriented to person, place, and time.  Psychiatric:        Mood and Affect: Mood normal.        Behavior: Behavior normal.  ED Treatments / Results  Labs (all labs ordered are listed, but only abnormal results are displayed) Labs Reviewed - No data to display  EKG None  Radiology Dg Chest 2  View  Result Date: 04/21/2018 CLINICAL DATA:  Cough fever and congestion EXAM: CHEST - 2 VIEW COMPARISON:  04/18/2018 FINDINGS: The heart size and mediastinal contours are within normal limits. Both lungs are clear. The visualized skeletal structures are unremarkable. IMPRESSION: No active cardiopulmonary disease. Electronically Signed   By: Donavan Foil M.D.   On: 04/21/2018 02:09    Procedures Procedures (including critical care time)  Medications Ordered in ED Medications  albuterol (PROVENTIL HFA;VENTOLIN HFA) 108 (90 Base) MCG/ACT inhaler 2 puff (has no administration in time range)  guaiFENesin-codeine 100-10 MG/5ML solution 10 mL (10 mLs Oral Given 04/21/18 0217)  albuterol (PROVENTIL) (2.5 MG/3ML) 0.083% nebulizer solution 5 mg (5 mg Nebulization Given 04/21/18 0217)     Initial Impression / Assessment and Plan / ED Course  I have reviewed the triage vital signs and the nursing notes.  Pertinent labs & imaging results that were available during my care of the patient were reviewed by me and considered in my medical decision making (see chart for details).    Patient presents for persistent cough, was seen 3 days ago and diagnosed with influenza, reports she continues to have fevers, chills, body aches, congestion and worsening cough, no chest pain or shortness of breath some mild nausea but no abdominal pain.  Lungs clear on auscultation.  I suspect patient is continuing to see experience symptoms of influenza, but will check chest x-ray to ensure patient has not developed pneumonia as a complication.  Will treat with guaifenesin codeine cough syrup and breathing treatment.  Chest x-ray shows no evidence of pneumonia or other active cardiopulmonary disease, feeling better after treatment here in the emergency department.  Will send home with inhaler and prescription for cough medication.  PCP follow-up encouraged to return precautions discussed.  Discussed typical course of influenza with  the patient and that I would expect her to continue to experience symptoms for 5 to 7 days after they began.  She expresses understanding and agreement with plan.  Stable for discharge home at this time.  Final Clinical Impressions(s) / ED Diagnoses   Final diagnoses:  Influenza-like illness  Cough    ED Discharge Orders         Ordered    guaiFENesin-codeine 100-10 MG/5ML syrup  3 times daily PRN     04/21/18 0232           Jacqlyn Larsen, PA-C 22/63/33 [id number]    Delora Fuel, MD 25/63/89 2250

## 2018-04-21 NOTE — Discharge Instructions (Addendum)
Your chest x-ray shows no evidence of pneumonia today.  Your symptoms are due to the flu, I would expect you to continue to have symptoms for 5 to 7 days after they have started, continue using ibuprofen and Tylenol as needed for fevers and body aches, use the prescribed cough medication as well as the albuterol inhaler given to you today.  Make sure you are drinking plenty of fluids and getting enough rest.  Follow-up with your primary care doctor.  Return to the emergency department if you have persistent fevers, worsening cough, chest pain or shortness of breath or any other new or concerning symptoms.

## 2018-04-24 ENCOUNTER — Ambulatory Visit (INDEPENDENT_AMBULATORY_CARE_PROVIDER_SITE_OTHER): Payer: 59 | Admitting: Family Medicine

## 2018-04-24 ENCOUNTER — Ambulatory Visit (INDEPENDENT_AMBULATORY_CARE_PROVIDER_SITE_OTHER): Payer: 59 | Admitting: Cardiology

## 2018-04-24 ENCOUNTER — Other Ambulatory Visit: Payer: Self-pay

## 2018-04-24 ENCOUNTER — Encounter: Payer: Self-pay | Admitting: Family Medicine

## 2018-04-24 VITALS — BP 114/73 | HR 93 | Ht 65.0 in | Wt 218.0 lb

## 2018-04-24 VITALS — BP 110/74 | HR 112 | Temp 98.0°F | Resp 16 | Ht 64.0 in | Wt 216.0 lb

## 2018-04-24 DIAGNOSIS — I208 Other forms of angina pectoris: Secondary | ICD-10-CM | POA: Diagnosis not present

## 2018-04-24 DIAGNOSIS — I10 Essential (primary) hypertension: Secondary | ICD-10-CM

## 2018-04-24 DIAGNOSIS — R05 Cough: Secondary | ICD-10-CM | POA: Diagnosis not present

## 2018-04-24 DIAGNOSIS — E785 Hyperlipidemia, unspecified: Secondary | ICD-10-CM | POA: Diagnosis not present

## 2018-04-24 DIAGNOSIS — R059 Cough, unspecified: Secondary | ICD-10-CM

## 2018-04-24 DIAGNOSIS — R739 Hyperglycemia, unspecified: Secondary | ICD-10-CM

## 2018-04-24 DIAGNOSIS — J069 Acute upper respiratory infection, unspecified: Secondary | ICD-10-CM

## 2018-04-24 MED ORDER — AMLODIPINE BESYLATE 5 MG PO TABS: 5.0000 mg | ORAL_TABLET | Freq: Every day | ORAL | 3 refills | Status: DC

## 2018-04-24 NOTE — Progress Notes (Signed)
Established Patient Office Visit  Subjective:  Patient ID: [name], female    DOB: Mar 26, 1970  Age: 48 y.o. MRN: [id number]  CC:  Chief Complaint  Patient presents with  . Influenza    1 week follow-up     HPI [name] presents for     This is a patient who is here today for return to work clearance She has had 2 visits to the ER for cough and congestion with x-rays that were negative x2 She has been afebrile since Tuesday which is 3 days ago. She recently had a cardiac work-up that was negative for ischemia. Today she reports that she is still somewhat short of breath with fatigue and poor appetite but feels like she can return to work.  She denies dizziness sore throat or fevers or chills.  Past Medical History:  Diagnosis Date  . Abnormal Pap smear 2000   [facility]  . Allergy   . Back pain   . Breast fibroadenoma   . Diverticulitis of colon with perforation 05/20/2014  . GERD (gastroesophageal reflux disease)   . H/O metrorrhagia 12/2006  . H/O myomectomy 07-2006   robotic  . H/O sinusitis   . Headache(784.0)   . History of conization of cervix 2000  . History of ectopic pregnancy 2005   r salpyngectomy  . History of hysterectomy, supracervical 08-2008   robotic  . History of syphilis   . History of syphilis   . Hx of adenomatous polyp of colon 12/31/2017  . Hx of herpes simplex type 2 infection   . Hyperlipidemia   . Hypertension   . Ileostomy present ([place]) 10/26/2014  . Intra-abdominal abscess ([place]) 07/21/2014  . PMS (premenstrual syndrome)     Past Surgical History:  Procedure Laterality Date  . ABDOMINAL HYSTERECTOMY    . BREAST LUMPECTOMY  10/11   rt-neg  . BREAST SURGERY  2004   fibroademoma  . CERVICAL CONE BIOPSY  2000  . COLON RESECTION N/A 07/06/2014   Procedure: LAPAROSCOPIC LOOP ILEOSTOMY WITH PLACEMENT OF PELVIC DRAIN;  Surgeon: Donnie Mesa, MD;  Location: [facility];  Service: General;  Laterality: N/A;  . COLON SURGERY    .  DILATION AND CURETTAGE OF UTERUS  2009  . DIVERTING ILEOSTOMY Right 06/2014  . ECTOPIC PREGNANCY SURGERY  2005   R salpyngectomy  . ILEOSTOMY N/A 10/26/2014   Procedure: LOOP ILEOSTOMY REVERSAL;  Surgeon: Donnie Mesa, MD;  Location: [facility];  Service: General;  Laterality: N/A;  . LAPAROSCOPIC SIGMOID COLECTOMY N/A 06/30/2014   Procedure: LAPAROSCOPIC ASSISTED SIGMOID COLECTOMY;  Surgeon: Donnie Mesa, MD;  Location: [facility];  Service: General;  Laterality: N/A;  . MYOMECTOMY  2008   robotic  . POLYPECTOMY  2009  . ROBOTIC ASSISTED LAP VAGINAL HYSTERECTOMY  2010   supracervical  . SALPINGECTOMY  2005  . TRIGGER FINGER RELEASE Right 09/28/2012   Procedure: RELEASE TRIGGER FINGER/A-1 PULLEY RIGHT THUMB;  Surgeon: Jolyn Nap, MD;  Location: [facility];  Service: Orthopedics;  Laterality: Right;    Family History  Problem Relation Age of Onset  . Hypertension Paternal Grandmother   . Diabetes Paternal Grandmother   . Hypertension Maternal Grandmother   . Sarcoidosis Mother   . Hypertension Mother   . Hyperlipidemia Mother   . Thyroid disease Maternal Aunt   . Mental illness Cousin   . Fibroids Sister     Social History   Socioeconomic History  . Marital status: Married  Spouse name: [name]  . Number of children: 2  . Years of education: Not on file  . Highest education level: Associate degree: occupational, Hotel manager, or vocational program  Occupational History  . Occupation: CMA    Comment: Primary Care at [employer]  . Financial resource strain: Not on file  . Food insecurity:    Worry: Not on file    Inability: Not on file  . Transportation needs:    Medical: Not on file    Non-medical: Not on file  Tobacco Use  . Smoking status: Former Smoker    Packs/day: 1.00    Years: 20.00    Pack years: 20.00    Types: Cigarettes    Last attempt to quit: 06/13/2011    Years since quitting: 6.8  . Smokeless tobacco: Never Used  Substance and  Sexual Activity  . Alcohol use: Not Currently  . Drug use: No  . Sexual activity: Yes  Lifestyle  . Physical activity:    Days per week: Not on file    Minutes per session: Not on file  . Stress: Not on file  Relationships  . Social connections:    Talks on phone: Not on file    Gets together: Not on file    Attends religious service: Not on file    Active member of club or organization: Not on file    Attends meetings of clubs or organizations: Not on file    Relationship status: Not on file  . Intimate partner violence:    Fear of current or ex partner: Not on file    Emotionally abused: Not on file    Physically abused: Not on file    Forced sexual activity: Not on file  Other Topics Concern  . Not on file  Social History Narrative   Lives with her husband and their son.    Outpatient Medications Prior to Visit  Medication Sig Dispense Refill  . atorvastatin (LIPITOR) 20 MG tablet Take 20 mg by mouth daily.    . benzonatate (TESSALON) 100 MG capsule Take 1-2 capsules (100-200 mg total) by mouth 3 (three) times daily as needed for cough. (Patient not taking: Reported on 04/24/2018) 40 capsule 0  . Black Cohosh (REMIFEMIN) 20 MG TABS Take 40 mg by mouth daily.    Marland Kitchen dextromethorphan 15 MG/5ML syrup Take 10 mLs (30 mg total) by mouth 4 (four) times daily as needed for cough. (Patient not taking: Reported on 04/24/2018) 120 mL 0  . diclofenac sodium (VOLTAREN) 1 % GEL Apply 2 g topically 4 (four) times daily.    . fluticasone (FLONASE) 50 MCG/ACT nasal spray Place 1 spray into both nostrils 2 (two) times daily. (Patient not taking: Reported on 04/24/2018) 16 g 6  . guaiFENesin-codeine 100-10 MG/5ML syrup Take 10 mLs by mouth 3 (three) times daily as needed for cough. (Patient not taking: Reported on 04/24/2018) 200 mL 0  . ibuprofen (ADVIL,MOTRIN) 800 MG tablet Take 1 tablet (800 mg total) by mouth every 8 (eight) hours as needed. 30 tablet 0  . lisinopril (PRINIVIL,ZESTRIL) 10 MG tablet  Take 1 tablet (10 mg total) by mouth daily. 90 tablet 1  . Multiple Vitamins-Minerals (HAIR/SKIN/NAILS/BIOTIN PO) Take 1 tablet by mouth daily.    . nitroGLYCERIN (NITROSTAT) 0.4 MG SL tablet Place 1 tablet (0.4 mg total) under the tongue every 5 (five) minutes as needed for up to 25 days for chest pain. 25 tablet 1  . amLODipine (NORVASC) 5 MG  tablet Take 1 tablet (5 mg total) by mouth daily. 180 tablet 3   No facility-administered medications prior to visit.     Allergies  Allergen Reactions  . Keflex [Cephalexin] Anaphylaxis  . Penicillins Anaphylaxis    Has patient had a PCN reaction causing immediate rash, facial/tongue/throat swelling, SOB or lightheadedness with hypotension: Yes Has patient had a PCN reaction causing severe rash involving mucus membranes or skin necrosis: No Has patient had a PCN reaction that required hospitalization: No Has patient had a PCN reaction occurring within the last 10 years: No If all of the above answers are "NO", then may proceed with Cephalosporin use.     ROS Review of Systems See hpi   Objective:    Physical Exam  BP 110/74   Pulse (!) 112   Temp 98 F (36.7 C) (Oral)   Resp 16   Ht 5\' 4"  (1.626 m)   Wt 216 lb (98 kg)   SpO2 98%   BMI 37.08 kg/m  Wt Readings from Last 3 Encounters:  04/24/18 218 lb (98.9 kg)  04/24/18 216 lb (98 kg)  04/20/18 225 lb (102.1 kg)    General: alert, oriented, in NAD Head: normocephalic, atraumatic, no sinus tenderness Eyes: EOM intact, no scleral icterus or conjunctival injection Ears: TM clear bilaterally Nose: mucosa nonerythematous, nonedematous Throat: no pharyngeal exudate or erythema Lymph: no posterior auricular, submental or cervical lymph adenopathy Heart: normal rate, normal sinus rhythm, no murmurs Lungs: clear to auscultation bilaterally, no wheezing    Result status: Final result  Echocardiogram 04/15/2018: Left ventricle cavity is normal in size. Moderate concentric  hypertrophy of the left ventricle. Normal global wall motion. Doppler evidence of grade I (impaired) diastolic dysfunction, normal LAP. Calculated EF 62%. Inadequate TR jet to estimate pulmonary artery systolic pressure. Normal right atrial pressure.     CLINICAL DATA:  Cough fever and congestion  EXAM: CHEST - 2 VIEW  COMPARISON:  04/18/2018  FINDINGS: The heart size and mediastinal contours are within normal limits. Both lungs are clear. The visualized skeletal structures are unremarkable.  IMPRESSION: No active cardiopulmonary disease.   Electronically Signed   By: Donavan Foil M.D.   On: 04/21/2018 02:09  There are no preventive care reminders to display for this patient.  There are no preventive care reminders to display for this patient.  Lab Results  Component Value Date   TSH 2.843 03/28/2018   Lab Results  Component Value Date   WBC 6.6 04/10/2018   HGB 13.7 04/10/2018   HCT 40.9 04/10/2018   MCV 89.0 04/10/2018   PLT 310 03/27/2018   Lab Results  Component Value Date   NA 142 03/27/2018   K 4.5 03/27/2018   CO2 27 03/27/2018   GLUCOSE 99 03/27/2018   BUN 9 03/27/2018   CREATININE 0.91 03/27/2018   BILITOT 0.8 03/27/2018   ALKPHOS 52 03/27/2018   AST 25 03/27/2018   ALT 26 03/27/2018   PROT 7.4 03/27/2018   ALBUMIN 4.3 03/27/2018   CALCIUM 10.0 03/27/2018   ANIONGAP 10 03/27/2018   Lab Results  Component Value Date   CHOL 162 03/28/2018   Lab Results  Component Value Date   HDL 59 03/28/2018   Lab Results  Component Value Date   LDLCALC 82 03/28/2018   Lab Results  Component Value Date   TRIG 106 03/28/2018   Lab Results  Component Value Date   CHOLHDL 2.7 03/28/2018   Lab Results  Component Value Date  HGBA1C 5.9 (H) 03/28/2018      Assessment & Plan:   Problem List Items Addressed This Visit    None    Visit Diagnoses    Cough    -  Primary   Acute upper respiratory infection         Based on current  symptoms patient is recuperating well and can return to work with light duty Given this patient's history of hypertension advised patient to avoid dextromethorphan and phenylephrine She is mildly tachycardic at this time due to lack of fluid intake  No orders of the defined types were placed in this encounter.   Follow-up: No follow-ups on file.    Forrest Moron, MD

## 2018-04-24 NOTE — Patient Instructions (Signed)
° ° ° °  If you have lab work done today you will be contacted with your lab results within the next 2 weeks.  If you have not heard from us then please contact us. The fastest way to get your results is to register for My Chart. ° ° °IF you received an x-ray today, you will receive an invoice from [facility]. Please contact [facility] at [phone] with questions or concerns regarding your invoice.  ° °IF you received labwork today, you will receive an invoice from [facility]. Please contact [facility] at [phone] with questions or concerns regarding your invoice.  ° °Our billing staff will not be able to assist you with questions regarding bills from these companies. ° °You will be contacted with the lab results as soon as they are available. The fastest way to get your results is to activate your My Chart account. Instructions are located on the last page of this paperwork. If you have not heard from us regarding the results in 2 weeks, please contact this office. °  ° ° ° °

## 2018-04-24 NOTE — Progress Notes (Signed)
Subjective:   @Patient  ID: [name], female    DOB: [birth year], 48 y.o.   MRN: [id number]  Chief Complaint  Patient presents with  . Hypertension  . Follow-up   HPI   [name] is a 48 year old female with a medical history significant for hypertension, hyperlipidemia, history of 10 pack year cigarette smoking, quit in 2013 and diverticulosis status post colectomy who presents to the the clinic for an evaluation of chest pain after an ED visit on 03/27/2018. Patient was first seen at Urgent Care for intermittent, substernal chest pain where her EKG demonstrated normal sinus rhythm with normal ventricular rate, inverted T wave in lead III, mild ST segment elevation on V 1-3 and was then transported to the ED. ED work-up was unremarkable with negative troponins and normal chest x-ray.  The episodes of chest pain last few minutes and usually appear when she is active. It is associated with diaphoresis and severe fatigue. She denies shortness of breath. Underwent Echocardiogram and nuclear stress test and presents for follow-up.  States that she has not had any further chest pain since being on amlodipine.  Past Medical History:  Diagnosis Date  . Abnormal Pap smear 2000   [facility]  . Allergy   . Back pain   . Breast fibroadenoma   . Diverticulitis of colon with perforation 05/20/2014  . GERD (gastroesophageal reflux disease)   . H/O metrorrhagia 12/2006  . H/O myomectomy 07-2006   robotic  . H/O sinusitis   . Headache(784.0)   . History of conization of cervix 2000  . History of ectopic pregnancy 2005   r salpyngectomy  . History of hysterectomy, supracervical 08-2008   robotic  . History of syphilis   . History of syphilis   . Hx of adenomatous polyp of colon 12/31/2017  . Hx of herpes simplex type 2 infection   . Hyperlipidemia   . Hypertension   . Ileostomy present ([place]) 10/26/2014  . Intra-abdominal abscess ([place]) 07/21/2014  . PMS (premenstrual syndrome)      Past Surgical History:  Procedure Laterality Date  . ABDOMINAL HYSTERECTOMY    . BREAST LUMPECTOMY  10/11   rt-neg  . BREAST SURGERY  2004   fibroademoma  . CERVICAL CONE BIOPSY  2000  . COLON RESECTION N/A 07/06/2014   Procedure: LAPAROSCOPIC LOOP ILEOSTOMY WITH PLACEMENT OF PELVIC DRAIN;  Surgeon: Donnie Mesa, MD;  Location: [facility];  Service: General;  Laterality: N/A;  . COLON SURGERY    . DILATION AND CURETTAGE OF UTERUS  2009  . DIVERTING ILEOSTOMY Right 06/2014  . ECTOPIC PREGNANCY SURGERY  2005   R salpyngectomy  . ILEOSTOMY N/A 10/26/2014   Procedure: LOOP ILEOSTOMY REVERSAL;  Surgeon: Donnie Mesa, MD;  Location: [facility];  Service: General;  Laterality: N/A;  . LAPAROSCOPIC SIGMOID COLECTOMY N/A 06/30/2014   Procedure: LAPAROSCOPIC ASSISTED SIGMOID COLECTOMY;  Surgeon: Donnie Mesa, MD;  Location: [facility];  Service: General;  Laterality: N/A;  . MYOMECTOMY  2008   robotic  . POLYPECTOMY  2009  . ROBOTIC ASSISTED LAP VAGINAL HYSTERECTOMY  2010   supracervical  . SALPINGECTOMY  2005  . TRIGGER FINGER RELEASE Right 09/28/2012   Procedure: RELEASE TRIGGER FINGER/A-1 PULLEY RIGHT THUMB;  Surgeon: Jolyn Nap, MD;  Location: [facility];  Service: Orthopedics;  Laterality: Right;    Social History   Socioeconomic History  . Marital status: Married    Spouse name: [name]  . Number of children: 2  .  Years of education: Not on file  . Highest education level: Associate degree: occupational, Hotel manager, or vocational program  Occupational History  . Occupation: CMA    Comment: Primary Care at [employer]  . Financial resource strain: Not on file  . Food insecurity:    Worry: Not on file    Inability: Not on file  . Transportation needs:    Medical: Not on file    Non-medical: Not on file  Tobacco Use  . Smoking status: Former Smoker    Packs/day: 1.00    Years: 20.00    Pack years: 20.00    Types: Cigarettes    Last attempt to quit:  06/13/2011    Years since quitting: 6.8  . Smokeless tobacco: Never Used  Substance and Sexual Activity  . Alcohol use: Not Currently  . Drug use: No  . Sexual activity: Yes  Lifestyle  . Physical activity:    Days per week: Not on file    Minutes per session: Not on file  . Stress: Not on file  Relationships  . Social connections:    Talks on phone: Not on file    Gets together: Not on file    Attends religious service: Not on file    Active member of club or organization: Not on file    Attends meetings of clubs or organizations: Not on file    Relationship status: Not on file  . Intimate partner violence:    Fear of current or ex partner: Not on file    Emotionally abused: Not on file    Physically abused: Not on file    Forced sexual activity: Not on file  Other Topics Concern  . Not on file  Social History Narrative   Lives with her husband and their son.    Current Outpatient Medications on File Prior to Visit  Medication Sig Dispense Refill  . atorvastatin (LIPITOR) 20 MG tablet Take 20 mg by mouth daily.    . Black Cohosh (REMIFEMIN) 20 MG TABS Take 40 mg by mouth daily.    . diclofenac sodium (VOLTAREN) 1 % GEL Apply 2 g topically 4 (four) times daily.    Marland Kitchen ibuprofen (ADVIL,MOTRIN) 800 MG tablet Take 1 tablet (800 mg total) by mouth every 8 (eight) hours as needed. 30 tablet 0  . lisinopril (PRINIVIL,ZESTRIL) 10 MG tablet Take 1 tablet (10 mg total) by mouth daily. 90 tablet 1  . Multiple Vitamins-Minerals (HAIR/SKIN/NAILS/BIOTIN PO) Take 1 tablet by mouth daily.    . nitroGLYCERIN (NITROSTAT) 0.4 MG SL tablet Place 1 tablet (0.4 mg total) under the tongue every 5 (five) minutes as needed for up to 25 days for chest pain. 25 tablet 1  . benzonatate (TESSALON) 100 MG capsule Take 1-2 capsules (100-200 mg total) by mouth 3 (three) times daily as needed for cough. (Patient not taking: Reported on 04/24/2018) 40 capsule 0  . dextromethorphan 15 MG/5ML syrup Take 10 mLs (30  mg total) by mouth 4 (four) times daily as needed for cough. (Patient not taking: Reported on 04/24/2018) 120 mL 0  . fluticasone (FLONASE) 50 MCG/ACT nasal spray Place 1 spray into both nostrils 2 (two) times daily. (Patient not taking: Reported on 04/24/2018) 16 g 6  . guaiFENesin-codeine 100-10 MG/5ML syrup Take 10 mLs by mouth 3 (three) times daily as needed for cough. (Patient not taking: Reported on 04/24/2018) 200 mL 0   No current facility-administered medications on file prior to visit.  DG Chest 2 View 04/21/2018: No active cardiopulmonary disease.  Echocardiogram 04/15/2018: Left ventricle cavity is normal in size. Moderate concentric hypertrophy of the left ventricle. Normal global wall motion. Doppler evidence of grade I (impaired) diastolic dysfunction, normal LAP. Calculated EF 62%. Inadequate TR jet to estimate pulmonary artery systolic pressure. Normal right atrial pressure.  Exercise sestamibi stress test 04/08/2018:  1. The patient performed treadmill exercise using Bruce protocol, completing 5:00 minutes. The patient completed an estimated workload of 7 METS, reaching 89% of the maximum predicted heart rate. Exercise capacity was low. Hemodynamic response was normal. Stress symptoms included fatigue.  2. The overall quality of the study is excellent. There is no evidence of abnormal lung activity. Stress and rest SPECT images demonstrate homogeneous tracer distribution throughout the myocardium. Gated SPECT imaging reveals normal myocardial thickening and wall motion. The left ventricular ejection fraction was normal (70%).   3. Low risk study.  EKG 03/27/18: Sinus rhythm. RSR' in V1 or V2, right VCD or RVH. Borderline ST elevation, lateral leads. Confirmed by Lacretia Leigh.  Review of Systems  Constitutional: Negative for unexpected weight change.  HENT: Negative for congestion.   Eyes: Negative for visual disturbance.  Respiratory: Positive for chest tightness.    Gastrointestinal: Negative for abdominal pain, nausea and vomiting.  Endocrine: Negative for cold intolerance.  Genitourinary: Negative for dysuria.  Musculoskeletal: Negative for myalgias.  Skin: Negative for rash.  Allergic/Immunologic: Negative for immunocompromised state.  Neurological: Negative for dizziness.  Hematological: Does not bruise/bleed easily.  Psychiatric/Behavioral: The patient is not nervous/anxious.   All other systems reviewed and are negative.      Objective: Blood pressure 114/73, pulse 93, height 5\' 5"  (1.651 m), weight 218 lb (98.9 kg), SpO2 98 %. Body mass index is 36.28 kg/m.    Physical Exam Vitals signs reviewed.  Constitutional:      General: She is not in acute distress.    Appearance: Normal appearance. She is obese.  HENT:     Head: Normocephalic.  Neck:     Musculoskeletal: Neck supple.     Thyroid: No thyromegaly.     Vascular: No carotid bruit.  Cardiovascular:     Rate and Rhythm: Normal rate and regular rhythm.     Pulses: Normal pulses.          Radial pulses are 2+ on the right side and 2+ on the left side.       Femoral pulses are 2+ on the right side and 2+ on the left side.      Popliteal pulses are 2+ on the right side and 2+ on the left side.       Dorsalis pedis pulses are 2+ on the right side and 2+ on the left side.       Posterior tibial pulses are 2+ on the right side and 2+ on the left side.     Heart sounds: Normal heart sounds, S1 normal and S2 normal. No murmur. No friction rub. No gallop.   Pulmonary:     Effort: Pulmonary effort is normal. No respiratory distress.     Breath sounds: Normal breath sounds. No wheezing, rhonchi or rales.  Abdominal:     General: Abdomen is protuberant. Bowel sounds are normal. There is no abdominal bruit.     Palpations: Abdomen is soft. There is no pulsatile mass.  Musculoskeletal:     Right lower leg: No edema.     Left lower leg: No edema.  Skin:  General: Skin is warm.   Neurological:     General: No focal deficit present.     Mental Status: She is alert.        Assessment & Recommendations:   1. Chest pain suggestive of angina pectoris. EKG 02/32nd 2020: Normal sinus rhythm at rate of 95 bpm, left atrial enlargement, otherwise normal EKG. Variant angina is also in the differential.  3. Nonspecific abnormal electrocardiogram (ECG) (EKG) Patient had mild ST elevation in V1-V3. With no reciprocal  Change and do not suspect ACS or pericarditis. This could also represent focal pericarditis, however chest pain is not consistent with pericarditis.  4. Essential hypertension Not well controlled. Add Norvasc 5 mg for hypertension and also possible angina.  5. Dyslipidemia  Lipid Panel     Component Value Date/Time   CHOL 162 03/28/2018 0156   CHOL 182 08/15/2017 1505   TRIG 106 03/28/2018 0156   HDL 59 03/28/2018 0156   HDL 66 08/15/2017 1505   CHOLHDL 2.7 03/28/2018 0156   VLDL 21 03/28/2018 0156   LDLCALC 82 03/28/2018 0156   LDLCALC 93 08/15/2017 1505    Hemoglobin A1C Latest Ref Range: 4.8 - 5.6 % 5.9 (H)   6. Hyperglycemia. Weight loss discussed. Her labs reviewed.  Recommendation:   Patient had "flu" and just started feeling better over the past 2 days. Has lost about 7 pounds in weight, No further chest pain since being on Amlodipine and also BP is better controlled. Although nuclear stress test is normal, her symptoms of chest discomfort with radiation to the neck are very atypical and suggestive of angina pectoris, she probably has variant angina.  Patient also has hyperglycemia, abdominal CT scan done in September 2019 had revealed calcified atherosclerotic plaque in the abdominal aorta.  Chest x-ray was clear. I have discussed with her regarding obesity, significant cardiac risk factors and increased her to lose weight.  To improve compliance and to follow-up of her symptoms of chest pain, blood pressure, I will see her back in 6 months  and if she remains stable I'll see her back on a p.r.n. basis.  Adrian Prows, MD, [facility] 04/25/2018, 10:38 AM [facility] [place] Pager: [phone] Office: [phone] If no answer Cell [phone]

## 2018-04-25 ENCOUNTER — Encounter: Payer: Self-pay | Admitting: Cardiology

## 2018-04-25 ENCOUNTER — Emergency Department (HOSPITAL_COMMUNITY)
Admission: EM | Admit: 2018-04-25 | Discharge: 2018-04-26 | Disposition: A | Discharge status: Home / Self Care | Payer: 59 | Attending: Emergency Medicine | Admitting: Emergency Medicine

## 2018-04-25 ENCOUNTER — Encounter (HOSPITAL_COMMUNITY): Payer: Self-pay

## 2018-04-25 ENCOUNTER — Other Ambulatory Visit: Payer: Self-pay

## 2018-04-25 ENCOUNTER — Emergency Department (HOSPITAL_COMMUNITY): Payer: 59

## 2018-04-25 DIAGNOSIS — Z79899 Other long term (current) drug therapy: Secondary | ICD-10-CM | POA: Insufficient documentation

## 2018-04-25 DIAGNOSIS — Z87891 Personal history of nicotine dependence: Secondary | ICD-10-CM | POA: Diagnosis not present

## 2018-04-25 DIAGNOSIS — R1032 Left lower quadrant pain: Secondary | ICD-10-CM | POA: Diagnosis not present

## 2018-04-25 DIAGNOSIS — R197 Diarrhea, unspecified: Secondary | ICD-10-CM | POA: Diagnosis not present

## 2018-04-25 DIAGNOSIS — K5730 Diverticulosis of large intestine without perforation or abscess without bleeding: Secondary | ICD-10-CM | POA: Diagnosis not present

## 2018-04-25 DIAGNOSIS — R112 Nausea with vomiting, unspecified: Secondary | ICD-10-CM | POA: Insufficient documentation

## 2018-04-25 DIAGNOSIS — I10 Essential (primary) hypertension: Secondary | ICD-10-CM | POA: Diagnosis not present

## 2018-04-25 LAB — CBC
HCT: 42.4 % (ref 36.0–46.0)
Hemoglobin: 13.2 g/dL (ref 12.0–15.0)
MCH: 28.3 pg (ref 26.0–34.0)
MCHC: 31.1 g/dL (ref 30.0–36.0)
MCV: 90.8 fL (ref 80.0–100.0)
Platelets: 336 10*3/uL (ref 150–400)
RBC: 4.67 MIL/uL (ref 3.87–5.11)
RDW: 12.3 % (ref 11.5–15.5)
WBC: 6.1 10*3/uL (ref 4.0–10.5)
nRBC: 0 % (ref 0.0–0.2)

## 2018-04-25 LAB — COMPREHENSIVE METABOLIC PANEL
ALT: 26 U/L (ref 0–44)
AST: 26 U/L (ref 15–41)
Albumin: 4.3 g/dL (ref 3.5–5.0)
Alkaline Phosphatase: 50 U/L (ref 38–126)
Anion gap: 8 (ref 5–15)
BUN: 8 mg/dL (ref 6–20)
CO2: 27 mmol/L (ref 22–32)
Calcium: 9.6 mg/dL (ref 8.9–10.3)
Chloride: 101 mmol/L (ref 98–111)
Creatinine, Ser: 0.89 mg/dL (ref 0.44–1.00)
Glucose, Bld: 99 mg/dL (ref 70–99)
Potassium: 3.9 mmol/L (ref 3.5–5.1)
Sodium: 136 mmol/L (ref 135–145)
TOTAL PROTEIN: 7.9 g/dL (ref 6.5–8.1)
Total Bilirubin: 0.7 mg/dL (ref 0.3–1.2)

## 2018-04-25 LAB — URINALYSIS, ROUTINE W REFLEX MICROSCOPIC
Bilirubin Urine: NEGATIVE
Glucose, UA: NEGATIVE mg/dL
HGB URINE DIPSTICK: NEGATIVE
Ketones, ur: NEGATIVE mg/dL
Leukocytes,Ua: NEGATIVE
Nitrite: NEGATIVE
Protein, ur: NEGATIVE mg/dL
Specific Gravity, Urine: 1.012 (ref 1.005–1.030)
pH: 9 — ABNORMAL HIGH (ref 5.0–8.0)

## 2018-04-25 LAB — LIPASE, BLOOD: Lipase: 34 U/L (ref 11–51)

## 2018-04-25 MED ORDER — SODIUM CHLORIDE 0.9 % IV BOLUS
1000.0000 mL | Freq: Once | INTRAVENOUS | Status: AC
  Administered 2018-04-25: 1000 mL via INTRAVENOUS

## 2018-04-25 MED ORDER — SODIUM CHLORIDE 0.9% FLUSH
3.0000 mL | Freq: Once | INTRAVENOUS | Status: AC
  Administered 2018-04-25: 3 mL via INTRAVENOUS

## 2018-04-25 MED ORDER — IOHEXOL 300 MG/ML  SOLN
100.0000 mL | Freq: Once | INTRAMUSCULAR | Status: AC | PRN
  Administered 2018-04-25: 100 mL via INTRAVENOUS

## 2018-04-25 MED ORDER — ONDANSETRON HCL 4 MG/2ML IJ SOLN
4.0000 mg | Freq: Once | INTRAMUSCULAR | Status: AC
  Administered 2018-04-25: 4 mg via INTRAVENOUS
  Filled 2018-04-25: qty 2

## 2018-04-25 MED ORDER — MORPHINE SULFATE (PF) 4 MG/ML IV SOLN
4.0000 mg | Freq: Once | INTRAVENOUS | Status: AC
  Administered 2018-04-25: 4 mg via INTRAVENOUS
  Filled 2018-04-25: qty 1

## 2018-04-25 NOTE — ED Provider Notes (Signed)
[facility] EMERGENCY DEPARTMENT Provider Note   CSN: [id number] Arrival date & time: 04/25/18  [place]  History   Chief Complaint Chief Complaint  Patient presents with  . Abdominal Pain    HPI [name] is a 48 y.o. female with past medical history significant for hysterectomy, diverticulitis s/p colectomy with reversal in 2016 who presents for evaluation of nausea, vomiting, diarrhea and left lower quadrant abdominal pain.  Symptom onset 2 days ago.  Has had persistent nausea.  Has had multiple episodes of nonbloody, nonbilious emesis.  Patient state had one episode of emesis today with constant nausea as well as one episode of nonbloody diarrhea.  Rates her pain a 6/10.  Pain located to her left lower quadrant.  Does not radiate.  Has not taken anything for pain PTA.  Of note patient was diagnosed with influenza 6 days ago.  Was not given Tamiflu due to being outside treatment window.  Has not had fever 4 days PTA.  Denies additional aggravating or relieving factors.  History obtained from patient.  No interpreter was used.    HPI  Past Medical History:  Diagnosis Date  . Abnormal Pap smear 2000   [facility]  . Allergy   . Back pain   . Breast fibroadenoma   . Diverticulitis of colon with perforation 05/20/2014  . GERD (gastroesophageal reflux disease)   . H/O metrorrhagia 12/2006  . H/O myomectomy 07-2006   robotic  . H/O sinusitis   . Headache(784.0)   . History of conization of cervix 2000  . History of ectopic pregnancy 2005   r salpyngectomy  . History of hysterectomy, supracervical 08-2008   robotic  . History of syphilis   . History of syphilis   . Hx of adenomatous polyp of colon 12/31/2017  . Hx of herpes simplex type 2 infection   . Hyperlipidemia   . Hypertension   . Ileostomy present ([place]) 10/26/2014  . Intra-abdominal abscess ([place]) 07/21/2014  . PMS (premenstrual syndrome)     Patient Active Problem List   Diagnosis Date Noted  .  Obesity (BMI 30-39.9) 03/29/2018  . Acute chest pain 03/27/2018  . Nonspecific abnormal electrocardiogram (ECG) (EKG) 03/27/2018  . Atypical chest pain 03/27/2018  . Hand pain, right 03/25/2018  . Dyslipidemia 08/15/2017  . History of hysterectomy, supracervical 02/14/2017  . Trigger ring finger of right hand 08/07/2015  . Primary osteoarthritis of first carpometacarpal joint of right hand 08/07/2015  . Diverticulitis of sigmoid colon s/p colectomy 06/30/2014 07/26/2014  . Tachycardia 02/04/2014  . Hx of adenomatous polyp of colon 12/23/2013  . Essential hypertension 11/02/2013  . Former smoker 11/26/2008    Past Surgical History:  Procedure Laterality Date  . ABDOMINAL HYSTERECTOMY    . BREAST LUMPECTOMY  10/11   rt-neg  . BREAST SURGERY  2004   fibroademoma  . CERVICAL CONE BIOPSY  2000  . COLON RESECTION N/A 07/06/2014   Procedure: LAPAROSCOPIC LOOP ILEOSTOMY WITH PLACEMENT OF PELVIC DRAIN;  Surgeon: Donnie Mesa, MD;  Location: [facility];  Service: General;  Laterality: N/A;  . COLON SURGERY    . DILATION AND CURETTAGE OF UTERUS  2009  . DIVERTING ILEOSTOMY Right 06/2014  . ECTOPIC PREGNANCY SURGERY  2005   R salpyngectomy  . ILEOSTOMY N/A 10/26/2014   Procedure: LOOP ILEOSTOMY REVERSAL;  Surgeon: Donnie Mesa, MD;  Location: [facility];  Service: General;  Laterality: N/A;  . LAPAROSCOPIC SIGMOID COLECTOMY N/A 06/30/2014   Procedure: LAPAROSCOPIC ASSISTED SIGMOID COLECTOMY;  Surgeon: Donnie Mesa, MD;  Location: [facility] [place];  Service: General;  Laterality: N/A;  . MYOMECTOMY  2008   robotic  . POLYPECTOMY  2009  . ROBOTIC ASSISTED LAP VAGINAL HYSTERECTOMY  2010   supracervical  . SALPINGECTOMY  2005  . TRIGGER FINGER RELEASE Right 09/28/2012   Procedure: RELEASE TRIGGER FINGER/A-1 PULLEY RIGHT THUMB;  Surgeon: Jolyn Nap, MD;  Location: [place];  Service: Orthopedics;  Laterality: Right;     OB History    Gravida  4   Para  2   Term  2   Preterm      AB   2   Living  2     SAB  0   TAB  1   Ectopic  1   Multiple  0   Live Births             Home Medications    Prior to Admission medications   Medication Sig Start Date End Date Taking? Authorizing Provider  amLODipine (NORVASC) 5 MG tablet Take 1 tablet (5 mg total) by mouth daily. 04/24/18 07/23/18  Adrian Prows, MD  atorvastatin (LIPITOR) 20 MG tablet Take 20 mg by mouth daily.    [provider]  benzonatate (TESSALON) 100 MG capsule Take 1-2 capsules (100-200 mg total) by mouth 3 (three) times daily as needed for cough. Patient not taking: Reported on 04/24/2018 04/10/18   Rutherford Guys, MD  Black Cohosh (REMIFEMIN) 20 MG TABS Take 40 mg by mouth daily.    [provider]  dextromethorphan 15 MG/5ML syrup Take 10 mLs (30 mg total) by mouth 4 (four) times daily as needed for cough. Patient not taking: Reported on 04/24/2018 04/18/18   Albrizze, Verline Lema E, PA-C  diclofenac sodium (VOLTAREN) 1 % GEL Apply 2 g topically 4 (four) times daily. 03/28/18   Norval Morton, MD  fluticasone (FLONASE) 50 MCG/ACT nasal spray Place 1 spray into both nostrils 2 (two) times daily. Patient not taking: Reported on 04/24/2018 04/10/18   Rutherford Guys, MD  guaiFENesin-codeine 100-10 MG/5ML syrup Take 10 mLs by mouth 3 (three) times daily as needed for cough. Patient not taking: Reported on 04/24/2018 04/21/18   Jacqlyn Larsen, PA-C  ibuprofen (ADVIL,MOTRIN) 800 MG tablet Take 1 tablet (800 mg total) by mouth every 8 (eight) hours as needed. 03/25/18   Wendie Agreste, MD  lisinopril (PRINIVIL,ZESTRIL) 10 MG tablet Take 1 tablet (10 mg total) by mouth daily. 08/15/17   Rutherford Guys, MD  Multiple Vitamins-Minerals (HAIR/SKIN/NAILS/BIOTIN PO) Take 1 tablet by mouth daily.    [provider]  nitroGLYCERIN (NITROSTAT) 0.4 MG SL tablet Place 1 tablet (0.4 mg total) under the tongue every 5 (five) minutes as needed for up to 25 days for chest pain. 04/02/18 04/27/18  Adrian Prows, MD    Family History Family History  Problem Relation Age of Onset  . Hypertension Paternal Grandmother   . Diabetes Paternal Grandmother   . Hypertension Maternal Grandmother   . Sarcoidosis Mother   . Hypertension Mother   . Hyperlipidemia Mother   . Thyroid disease Maternal Aunt   . Mental illness Cousin   . Fibroids Sister     Social History Social History   Tobacco Use  . Smoking status: Former Smoker    Packs/day: 1.00    Years: 20.00    Pack years: 20.00    Types: Cigarettes    Last attempt to quit: 06/13/2011  Years since quitting: 6.8  . Smokeless tobacco: Never Used  Substance Use Topics  . Alcohol use: Not Currently  . Drug use: No     Allergies   Keflex [cephalexin] and Penicillins   Review of Systems Review of Systems  Constitutional: Negative.   HENT: Negative.   Respiratory: Negative.   Cardiovascular: Negative.   Gastrointestinal: Positive for abdominal pain, diarrhea, nausea and vomiting. Negative for abdominal distention, anal bleeding, blood in stool, constipation and rectal pain.  Genitourinary: Negative.   Skin: Negative.   Neurological: Negative.   All other systems reviewed and are negative.    Physical Exam Updated Vital Signs BP (!) 141/81   Pulse 90   Temp 98.2 F (36.8 C) (Oral)   Resp 18   Ht 5\' 5"  (1.651 m)   Wt 98 kg   SpO2 98%   BMI 35.94 kg/m   Physical Exam Vitals signs and nursing note reviewed.  Constitutional:      General: She is not in acute distress.    Appearance: She is well-developed. She is not ill-appearing, toxic-appearing or diaphoretic.  HENT:     Head: Normocephalic and atraumatic.     Mouth/Throat:     Comments: Posterior oropharynx clear.  Mucous membranes moist.  No uvula deviation. Eyes:     Pupils: Pupils are equal, round, and reactive to light.  Neck:     Musculoskeletal: Normal range of motion.  Cardiovascular:     Rate and Rhythm: Normal rate.     Heart sounds: Normal heart sounds.   Pulmonary:     Effort: No respiratory distress.     Comments: Clear to auscultation bilaterally without wheeze, rhonchi or rales.  No accessory muscle usage.  Able speak in full sentences without difficulty. Abdominal:     General: There is no distension.     Comments: Soft without rebound or guarding.  Tenderness to palpation to left lower quadrant.  Normoactive bowel sounds.  Patient has old abdominal scars to abdomen.  Negative CVA tenderness.  Musculoskeletal: Normal range of motion.     Comments: Moves all 4 extremities without difficulty.  Ambulatory in department that difficulty.  Skin:    General: Skin is warm and dry.     Comments: Brisk capillary refill.  Neurological:     Mental Status: She is alert.    ED Treatments / Results  Labs (all labs ordered are listed, but only abnormal results are displayed) Labs Reviewed  URINALYSIS, ROUTINE W REFLEX MICROSCOPIC - Abnormal; Notable for the following components:      Result Value   pH 9.0 (*)    All other components within normal limits  LIPASE, BLOOD  COMPREHENSIVE METABOLIC PANEL  CBC    EKG None  Radiology No results found.  Procedures Procedures (including critical care time)  Medications Ordered in ED Medications  sodium chloride flush (NS) 0.9 % injection 3 mL (has no administration in time range)  sodium chloride 0.9 % bolus 1,000 mL (has no administration in time range)  ondansetron (ZOFRAN) injection 4 mg (4 mg Intravenous Given 04/25/18 2325)  morphine 4 MG/ML injection 4 mg (4 mg Intravenous Given 04/25/18 2325)   Initial Impression / Assessment and Plan / ED Course  I have reviewed the triage vital signs and the nursing notes.  Pertinent labs & imaging results that were available during my care of the patient were reviewed by me and considered in my medical decision making (see chart for details).  48 year old female appears  otherwise well presents for evaluation of abdominal pain.  Afebrile, nonseptic,  non-ill-appearing.  History of diverticulitis s/p colectomy with reversal in 2016.  Abdomen soft without rebound or guarding.  Tenderness to left lower quadrant.  Mucous membranes moist.  Lungs clear to auscultation bilateral without wheeze, rhonchi or rales.  No tachypnea, tachycardia or hypoxia. Labs obtained from triage.  CBC without leukocytosis, lipase 34, developing without electrolyte, renal or liver abnormality, urinalysis negative for infection.  Will give Zofran, fluids, pain medication and CT scan and reevaluate.  Patient care transferred to [facility] [place] at shift change who will determine ultimate plan and disposition of patient.      Final Clinical Impressions(s) / ED Diagnoses   Final diagnoses:  None    ED Discharge Orders    None       Henderly, Britni A, PA-C 04/25/18 2330    Lajean Saver, MD 04/25/18 2353

## 2018-04-25 NOTE — ED Provider Notes (Signed)
Received patient at signout from [facility].  Refer to provider note for full history and physical examination.  Briefly, patient is a 48 year old female with history significant for abdominal hysterectomy, diverticulitis status post colectomy with reversal in 2016 presenting for evaluation of nausea, vomiting, diarrhea, and left lower quadrant abdominal pain.  Recent diagnosis of flu clinically earlier this week.  Pending CT scan and p.o. challenge.  Physical Exam  BP (!) 141/81   Pulse 90   Temp 98.2 F (36.8 C) (Oral)   Resp 18   Ht 5\' 5"  (1.651 m)   Wt 98 kg   SpO2 98%   BMI 35.94 kg/m   Physical Exam Vitals signs and nursing note reviewed.  Constitutional:      General: She is not in acute distress.    Appearance: She is well-developed.  HENT:     Head: Normocephalic and atraumatic.  Eyes:     General:        Right eye: No discharge.        Left eye: No discharge.     Conjunctiva/sclera: Conjunctivae normal.  Neck:     Vascular: No JVD.     Trachea: No tracheal deviation.  Cardiovascular:     Rate and Rhythm: Normal rate.  Pulmonary:     Effort: Pulmonary effort is normal.  Abdominal:     General: Bowel sounds are decreased. There is no distension.     Tenderness: There is abdominal tenderness in the left upper quadrant and left lower quadrant. There is no guarding or rebound.  Skin:    Findings: No erythema.  Neurological:     Mental Status: She is alert.  Psychiatric:        Behavior: Behavior normal.       MDM  IMPRESSION: 1. Colonic diverticulosis without acute diverticulitis nor acute intra-abdominal/pelvic process. 2. Status post partial colectomy and small bowel surgery.  Patient resting comfortably no apparent distress.  Reports that her symptoms has significantly improved with Zofran.  Tolerating p.o. fluids without difficulty.  Serial abdominal examinations remain benign.  No evidence of acute surgical abdominal pathology.  Suspect likely viral  gastroenteritis.  Discussed supportive management.  Will discharge with Zofran and Bentyl, advancing diet slowly.  Recommend follow-up with PCP if symptoms persist.  Discussed strict ED return precautions. Pt verbalized understanding of and agreement with plan and is safe for discharge home at this time.        Renita Papa, PA-C 04/26/18 0128    Ward, Delice Bison, DO 04/26/18 [phone]

## 2018-04-25 NOTE — ED Triage Notes (Signed)
Pt reports LLQ abdominal pain. States hx of diverticulitis. Also endorses n/v. No distress noted.

## 2018-04-26 DIAGNOSIS — R1032 Left lower quadrant pain: Secondary | ICD-10-CM | POA: Diagnosis not present

## 2018-04-26 DIAGNOSIS — R112 Nausea with vomiting, unspecified: Secondary | ICD-10-CM | POA: Diagnosis not present

## 2018-04-26 DIAGNOSIS — I10 Essential (primary) hypertension: Secondary | ICD-10-CM | POA: Diagnosis not present

## 2018-04-26 DIAGNOSIS — R197 Diarrhea, unspecified: Secondary | ICD-10-CM | POA: Diagnosis not present

## 2018-04-26 DIAGNOSIS — Z87891 Personal history of nicotine dependence: Secondary | ICD-10-CM | POA: Diagnosis not present

## 2018-04-26 DIAGNOSIS — Z79899 Other long term (current) drug therapy: Secondary | ICD-10-CM | POA: Diagnosis not present

## 2018-04-26 MED ORDER — ONDANSETRON 4 MG PO TBDP: 4.0000 mg | ORAL_TABLET | Freq: Three times a day (TID) | ORAL | 0 refills | Status: DC | PRN

## 2018-04-26 MED ORDER — DICYCLOMINE HCL 20 MG PO TABS: 20.0000 mg | ORAL_TABLET | Freq: Two times a day (BID) | ORAL | 0 refills | Status: DC

## 2018-04-26 NOTE — ED Notes (Addendum)
Pt. Stated relief of nausea after Zofran.

## 2018-04-26 NOTE — ED Notes (Signed)
Pt discharge instructions, diet, medications, and follow up care discussed with pt. Pt. Verbalized no further question . Pt. Ambulatory, discharged home with spouse.

## 2018-04-26 NOTE — ED Notes (Signed)
After fluids gingerale, pt. Denies NV.

## 2018-04-26 NOTE — ED Notes (Signed)
Pt. Given fluid per fluid challenge.

## 2018-04-26 NOTE — Discharge Instructions (Signed)
1. Medications: Take Zofran as needed for nausea.  Let this medicine dissolve under your tongue and wait around 10-15 minutes before eating or drinking after taking this medication.  You can take Bentyl up to twice daily as needed for abdominal cramping. 2. Treatment: rest, drink plenty of fluids, advance diet slowly.  Start with water and broth then advance to bland foods that will not upset your stomach such as crackers, mashed potatoes, and peanut butter. 3. Follow Up: Please followup with your primary doctor in 3 days for discussion of your diagnoses and further evaluation after today's visit; if you do not have a primary care doctor use the resource guide provided to find one; Please return to the ER for persistent vomiting, high fevers or worsening symptoms

## 2018-04-27 ENCOUNTER — Telehealth: Payer: Self-pay | Admitting: Family Medicine

## 2018-04-27 NOTE — Telephone Encounter (Signed)
Patient called regard nausea, vomiting and abdominal pain that started after eating chicken nuggets. No fevers Poor po intake No fevers   No blood in stool  Left lower quadrant pain  Advised pt to go to [facility] Discussed that because of her partial colectomy and symptoms she should be evaluated

## 2018-04-28 ENCOUNTER — Telehealth: Payer: Self-pay | Admitting: Emergency Medicine

## 2018-04-28 NOTE — Telephone Encounter (Signed)
Patient [employer] short term disability claim form is in your box to be filled out.

## 2018-06-01 ENCOUNTER — Other Ambulatory Visit: Payer: Self-pay

## 2018-06-01 DIAGNOSIS — I10 Essential (primary) hypertension: Secondary | ICD-10-CM

## 2018-06-01 MED ORDER — LISINOPRIL 10 MG PO TABS: 10.0000 mg | ORAL_TABLET | Freq: Every day | ORAL | 1 refills | Status: DC

## 2018-06-05 MED FILL — LISINOPRIL 10 MG TABLET: 10 | 90 days supply | Qty: 90 | Fill #0

## 2018-06-22 MED FILL — AMLODIPINE BESYLATE 5 MG TA: 5 | 90 days supply | Qty: 90 | Fill #0

## 2018-07-03 ENCOUNTER — Other Ambulatory Visit: Payer: Self-pay | Admitting: Family Medicine

## 2018-07-03 MED ORDER — ATORVASTATIN CALCIUM 20 MG PO TABS: 20.0000 mg | ORAL_TABLET | Freq: Every day | ORAL | 0 refills | Status: DC

## 2018-07-03 MED FILL — ATORVASTATIN 20 MG TABLET: 20 | 90 days supply | Qty: 90 | Fill #0

## 2018-07-28 NOTE — Telephone Encounter (Signed)
done

## 2018-08-02 ENCOUNTER — Encounter (HOSPITAL_COMMUNITY): Payer: Self-pay | Admitting: Emergency Medicine

## 2018-08-02 ENCOUNTER — Emergency Department (HOSPITAL_COMMUNITY)
Admission: EM | Admit: 2018-08-02 | Discharge: 2018-08-02 | Disposition: A | Discharge status: Home / Self Care | Payer: 59 | Attending: Emergency Medicine | Admitting: Emergency Medicine

## 2018-08-02 DIAGNOSIS — S91114A Laceration without foreign body of right lesser toe(s) without damage to nail, initial encounter: Secondary | ICD-10-CM | POA: Insufficient documentation

## 2018-08-02 DIAGNOSIS — Y998 Other external cause status: Secondary | ICD-10-CM | POA: Insufficient documentation

## 2018-08-02 DIAGNOSIS — Z87891 Personal history of nicotine dependence: Secondary | ICD-10-CM | POA: Diagnosis not present

## 2018-08-02 DIAGNOSIS — W260XXA Contact with knife, initial encounter: Secondary | ICD-10-CM | POA: Diagnosis not present

## 2018-08-02 DIAGNOSIS — Z79899 Other long term (current) drug therapy: Secondary | ICD-10-CM | POA: Insufficient documentation

## 2018-08-02 DIAGNOSIS — Y9389 Activity, other specified: Secondary | ICD-10-CM | POA: Insufficient documentation

## 2018-08-02 DIAGNOSIS — Y929 Unspecified place or not applicable: Secondary | ICD-10-CM | POA: Diagnosis not present

## 2018-08-02 DIAGNOSIS — I10 Essential (primary) hypertension: Secondary | ICD-10-CM | POA: Diagnosis not present

## 2018-08-02 DIAGNOSIS — S91214A Laceration without foreign body of right lesser toe(s) with damage to nail, initial encounter: Secondary | ICD-10-CM | POA: Diagnosis not present

## 2018-08-02 MED ORDER — LIDOCAINE HCL (PF) 1 % IJ SOLN
5.0000 mL | Freq: Once | INTRAMUSCULAR | Status: AC
  Administered 2018-08-02: 5 mL
  Filled 2018-08-02: qty 5

## 2018-08-02 NOTE — Discharge Instructions (Signed)
You were seen in the ED for a laceration to your toe; 2 sutures were placed. These will need to be removed in 10 days time. You may return to the ED or follow up with your PCP for removal.

## 2018-08-02 NOTE — ED Provider Notes (Signed)
[facility] EMERGENCY DEPARTMENT Provider Note   CSN: [id number] Arrival date & time: 08/02/18  1748    History   Chief Complaint No chief complaint on file.   HPI [name] is a 48 y.o. female who presents to the ED for laceration to her right 2nd toe that occurred prior to arrival. She reports she was cutting up a watermelon when the knife slipped out of her hand and hit her in the toe, causing the laceration. Bleeding controlled in the ED. No other complaints at this time. Tetanus up to date.        Past Medical History:  Diagnosis Date  . Abnormal Pap smear 2000   [facility] Biopsy  . Allergy   . Back pain   . Breast fibroadenoma   . Diverticulitis of colon with perforation 05/20/2014  . GERD (gastroesophageal reflux disease)   . H/O metrorrhagia 12/2006  . H/O myomectomy 07-2006   robotic  . H/O sinusitis   . Headache(784.0)   . History of conization of cervix 2000  . History of ectopic pregnancy 2005   r salpyngectomy  . History of hysterectomy, supracervical 08-2008   robotic  . History of syphilis   . History of syphilis   . Hx of adenomatous polyp of colon 12/31/2017  . Hx of herpes simplex type 2 infection   . Hyperlipidemia   . Hypertension   . Ileostomy present ([place]) 10/26/2014  . Intra-abdominal abscess ([place]) 07/21/2014  . PMS (premenstrual syndrome)     Patient Active Problem List   Diagnosis Date Noted  . Obesity (BMI 30-39.9) 03/29/2018  . Acute chest pain 03/27/2018  . Nonspecific abnormal electrocardiogram (ECG) (EKG) 03/27/2018  . Atypical chest pain 03/27/2018  . Hand pain, right 03/25/2018  . Dyslipidemia 08/15/2017  . History of hysterectomy, supracervical 02/14/2017  . Trigger ring finger of right hand 08/07/2015  . Primary osteoarthritis of first carpometacarpal joint of right hand 08/07/2015  . Diverticulitis of sigmoid colon s/p colectomy 06/30/2014 07/26/2014  . Tachycardia 02/04/2014  . Hx of adenomatous polyp of  colon 12/23/2013  . Essential hypertension 11/02/2013  . Former smoker 11/26/2008    Past Surgical History:  Procedure Laterality Date  . ABDOMINAL HYSTERECTOMY    . BREAST LUMPECTOMY  10/11   rt-neg  . BREAST SURGERY  2004   fibroademoma  . CERVICAL CONE BIOPSY  2000  . COLON RESECTION N/A 07/06/2014   Procedure: LAPAROSCOPIC LOOP ILEOSTOMY WITH PLACEMENT OF PELVIC DRAIN;  Surgeon: Donnie Mesa, MD;  Location: [facility];  Service: General;  Laterality: N/A;  . COLON SURGERY    . DILATION AND CURETTAGE OF UTERUS  2009  . DIVERTING ILEOSTOMY Right 06/2014  . ECTOPIC PREGNANCY SURGERY  2005   R salpyngectomy  . ILEOSTOMY N/A 10/26/2014   Procedure: LOOP ILEOSTOMY REVERSAL;  Surgeon: Donnie Mesa, MD;  Location: [facility];  Service: General;  Laterality: N/A;  . LAPAROSCOPIC SIGMOID COLECTOMY N/A 06/30/2014   Procedure: LAPAROSCOPIC ASSISTED SIGMOID COLECTOMY;  Surgeon: Donnie Mesa, MD;  Location: [facility];  Service: General;  Laterality: N/A;  . MYOMECTOMY  2008   robotic  . POLYPECTOMY  2009  . ROBOTIC ASSISTED LAP VAGINAL HYSTERECTOMY  2010   supracervical  . SALPINGECTOMY  2005  . TRIGGER FINGER RELEASE Right 09/28/2012   Procedure: RELEASE TRIGGER FINGER/A-1 PULLEY RIGHT THUMB;  Surgeon: Jolyn Nap, MD;  Location: [facility];  Service: Orthopedics;  Laterality: Right;     OB History  Gravida  4   Para  2   Term  2   Preterm      AB  2   Living  2     SAB  0   TAB  1   Ectopic  1   Multiple  0   Live Births               Home Medications    Prior to Admission medications   Medication Sig Start Date End Date Taking? Authorizing Provider  amLODipine (NORVASC) 5 MG tablet Take 1 tablet (5 mg total) by mouth daily. 04/24/18 07/23/18  Adrian Prows, MD  atorvastatin (LIPITOR) 20 MG tablet Take 1 tablet (20 mg total) by mouth daily. 07/03/18   Rutherford Guys, MD  benzonatate (TESSALON) 100 MG capsule Take 1-2 capsules (100-200 mg total) by mouth 3  (three) times daily as needed for cough. Patient not taking: Reported on 04/24/2018 04/10/18   Rutherford Guys, MD  Black Cohosh (REMIFEMIN) 20 MG TABS Take 40 mg by mouth daily.    [provider]  dextromethorphan 15 MG/5ML syrup Take 10 mLs (30 mg total) by mouth 4 (four) times daily as needed for cough. Patient not taking: Reported on 04/24/2018 04/18/18   Albrizze, Verline Lema E, PA-C  diclofenac sodium (VOLTAREN) 1 % GEL Apply 2 g topically 4 (four) times daily. 03/28/18   Norval Morton, MD  dicyclomine (BENTYL) 20 MG tablet Take 1 tablet (20 mg total) by mouth 2 (two) times daily. 04/26/18   Fawze, Mina A, PA-C  fluticasone (FLONASE) 50 MCG/ACT nasal spray Place 1 spray into both nostrils 2 (two) times daily. Patient not taking: Reported on 04/24/2018 04/10/18   Rutherford Guys, MD  guaiFENesin-codeine 100-10 MG/5ML syrup Take 10 mLs by mouth 3 (three) times daily as needed for cough. Patient not taking: Reported on 04/24/2018 04/21/18   Jacqlyn Larsen, PA-C  ibuprofen (ADVIL,MOTRIN) 800 MG tablet Take 1 tablet (800 mg total) by mouth every 8 (eight) hours as needed. 03/25/18   Wendie Agreste, MD  lisinopril (PRINIVIL,ZESTRIL) 10 MG tablet Take 1 tablet (10 mg total) by mouth daily. 06/01/18   Rutherford Guys, MD  Multiple Vitamins-Minerals (HAIR/SKIN/NAILS/BIOTIN PO) Take 1 tablet by mouth daily.    [provider]  nitroGLYCERIN (NITROSTAT) 0.4 MG SL tablet Place 1 tablet (0.4 mg total) under the tongue every 5 (five) minutes as needed for up to 25 days for chest pain. 04/02/18 04/27/18  Adrian Prows, MD  ondansetron (ZOFRAN ODT) 4 MG disintegrating tablet Take 1 tablet (4 mg total) by mouth every 8 (eight) hours as needed for nausea or vomiting. 04/26/18   Renita Papa, PA-C    Family History Family History  Problem Relation Age of Onset  . Hypertension Paternal Grandmother   . Diabetes Paternal Grandmother   . Hypertension Maternal Grandmother   . Sarcoidosis Mother   . Hypertension  Mother   . Hyperlipidemia Mother   . Thyroid disease Maternal Aunt   . Mental illness Cousin   . Fibroids Sister     Social History Social History   Tobacco Use  . Smoking status: Former Smoker    Packs/day: 1.00    Years: 20.00    Pack years: 20.00    Types: Cigarettes    Quit date: 06/13/2011    Years since quitting: 7.1  . Smokeless tobacco: Never Used  Substance Use Topics  . Alcohol use: Not Currently  . Drug use:  No     Allergies   Keflex [cephalexin] and Penicillins   Review of Systems Review of Systems  Constitutional: Negative for fever.  Musculoskeletal: Positive for arthralgias.  Skin: Positive for wound.     Physical Exam Updated Vital Signs BP 137/75   Pulse 100   Temp 98.9 F (37.2 C) (Oral)   Resp 16   SpO2 100%   Physical Exam Vitals signs and nursing note reviewed.  Constitutional:      Appearance: She is not ill-appearing.  HENT:     Head: Normocephalic and atraumatic.  Eyes:     Conjunctiva/sclera: Conjunctivae normal.  Cardiovascular:     Rate and Rhythm: Normal rate and regular rhythm.  Pulmonary:     Effort: Pulmonary effort is normal.     Breath sounds: Normal breath sounds.  Musculoskeletal:     Comments: 1 cm laceration to right 2nd toe, bleeding controlled, TTP along toe; sensation intact, cap refill < 2 seconds, no other tenderness to foot; 2+ DP and PT pulse  Skin:    General: Skin is warm and dry.     Coloration: Skin is not jaundiced.  Neurological:     Mental Status: She is alert.      ED Treatments / Results  Labs (all labs ordered are listed, but only abnormal results are displayed) Labs Reviewed - No data to display  EKG None  Radiology No results found.  Procedures .Marland KitchenLaceration Repair  Date/Time: 08/02/2018 7:22 PM Performed by: Eustaquio Maize, PA-C Authorized by: Eustaquio Maize, PA-C   Consent:    Consent obtained:  Verbal   Consent given by:  Patient   Risks discussed:  Infection, pain and  poor cosmetic result Anesthesia (see MAR for exact dosages):    Anesthesia method:  Local infiltration   Local anesthetic:  Lidocaine 1% w/o epi Laceration details:    Location:  Toe   Toe location:  R second toe   Length (cm):  1   Depth (mm):  2 Repair type:    Repair type:  Simple Pre-procedure details:    Preparation:  Patient was prepped and draped in usual sterile fashion Exploration:    Hemostasis achieved with:  Direct pressure   Wound exploration: wound explored through full range of motion   Treatment:    Area cleansed with:  Betadine   Amount of cleaning:  Standard   Irrigation solution:  Sterile saline Skin repair:    Repair method:  Sutures   Suture size:  5-0   Suture material:  Prolene   Suture technique:  Simple interrupted   Number of sutures:  2 Approximation:    Approximation:  Close Post-procedure details:    Dressing:  Bulky dressing   Patient tolerance of procedure:  Tolerated well, no immediate complications   (including critical care time)  Medications Ordered in ED Medications  lidocaine (PF) (XYLOCAINE) 1 % injection 5 mL (5 mLs Infiltration Given by Other 08/02/18 1935)     Initial Impression / Assessment and Plan / ED Course  I have reviewed the triage vital signs and the nursing notes.  Pertinent labs & imaging results that were available during my care of the patient were reviewed by me and considered in my medical decision making (see chart for details).    Pt is a 48  Year old female who presents with laceration to right 2nd toe after knife fell onto it. She reports knife fell and bounced off; causing laceration. Tetanus uptodate. Do not feel  patient needs imaging at this time given hx of knife just bouncing off toe. Will apply 1-2 sutures given small laceration and have pt follow up with PCP.  Laceration repair was successful without complicated; dressing applied and pt discharged home with PCP follow up. Pt advised to have sutures  removed in 10 days time. Strict return precautions discussed with patient. She is in agreement with plan and stable for discharge home.        Final Clinical Impressions(s) / ED Diagnoses   Final diagnoses:  Laceration of lesser toe of right foot without foreign body present or damage to nail, initial encounter    ED Discharge Orders    None       Eustaquio Maize, Hershal Coria 08/02/18 2308    Jola Schmidt, MD 08/03/18 1404

## 2018-08-06 DIAGNOSIS — M79641 Pain in right hand: Secondary | ICD-10-CM | POA: Diagnosis not present

## 2018-08-13 ENCOUNTER — Encounter: Payer: Self-pay | Admitting: Neurology

## 2018-08-14 ENCOUNTER — Other Ambulatory Visit: Payer: Self-pay

## 2018-08-14 DIAGNOSIS — G5621 Lesion of ulnar nerve, right upper limb: Secondary | ICD-10-CM

## 2018-08-15 ENCOUNTER — Other Ambulatory Visit: Payer: Self-pay

## 2018-08-15 ENCOUNTER — Encounter (HOSPITAL_COMMUNITY): Payer: Self-pay | Admitting: Emergency Medicine

## 2018-08-15 ENCOUNTER — Emergency Department (HOSPITAL_COMMUNITY)
Admission: EM | Admit: 2018-08-15 | Discharge: 2018-08-15 | Disposition: A | Discharge status: Home / Self Care | Payer: 59 | Attending: Emergency Medicine | Admitting: Emergency Medicine

## 2018-08-15 DIAGNOSIS — Z4802 Encounter for removal of sutures: Secondary | ICD-10-CM

## 2018-08-15 DIAGNOSIS — S91114D Laceration without foreign body of right lesser toe(s) without damage to nail, subsequent encounter: Secondary | ICD-10-CM | POA: Diagnosis not present

## 2018-08-15 DIAGNOSIS — X58XXXD Exposure to other specified factors, subsequent encounter: Secondary | ICD-10-CM | POA: Insufficient documentation

## 2018-08-15 DIAGNOSIS — Z79899 Other long term (current) drug therapy: Secondary | ICD-10-CM | POA: Insufficient documentation

## 2018-08-15 DIAGNOSIS — Z87891 Personal history of nicotine dependence: Secondary | ICD-10-CM | POA: Diagnosis not present

## 2018-08-15 DIAGNOSIS — I10 Essential (primary) hypertension: Secondary | ICD-10-CM | POA: Diagnosis not present

## 2018-08-15 NOTE — ED Provider Notes (Signed)
[facility] EMERGENCY DEPARTMENT Provider Note   CSN: [id number] Arrival date & time: 08/15/18  0973    History   Chief Complaint Chief Complaint  Patient presents with  . Suture / Staple Removal    HPI [name] is a 48 y.o. female who presents for suture removal. She had 2 stitches placed in the R 2nd toe on 08/02/2018. She denies any drainage  Or sig pain.     HPI  Past Medical History:  Diagnosis Date  . Abnormal Pap smear 2000   [facility] Biopsy  . Allergy   . Back pain   . Breast fibroadenoma   . Diverticulitis of colon with perforation 05/20/2014  . GERD (gastroesophageal reflux disease)   . H/O metrorrhagia 12/2006  . H/O myomectomy 07-2006   robotic  . H/O sinusitis   . Headache(784.0)   . History of conization of cervix 2000  . History of ectopic pregnancy 2005   r salpyngectomy  . History of hysterectomy, supracervical 08-2008   robotic  . History of syphilis   . History of syphilis   . Hx of adenomatous polyp of colon 12/31/2017  . Hx of herpes simplex type 2 infection   . Hyperlipidemia   . Hypertension   . Ileostomy present ([place]) 10/26/2014  . Intra-abdominal abscess ([place]) 07/21/2014  . PMS (premenstrual syndrome)     Patient Active Problem List   Diagnosis Date Noted  . Obesity (BMI 30-39.9) 03/29/2018  . Acute chest pain 03/27/2018  . Nonspecific abnormal electrocardiogram (ECG) (EKG) 03/27/2018  . Atypical chest pain 03/27/2018  . Hand pain, right 03/25/2018  . Dyslipidemia 08/15/2017  . History of hysterectomy, supracervical 02/14/2017  . Trigger ring finger of right hand 08/07/2015  . Primary osteoarthritis of first carpometacarpal joint of right hand 08/07/2015  . Diverticulitis of sigmoid colon s/p colectomy 06/30/2014 07/26/2014  . Tachycardia 02/04/2014  . Hx of adenomatous polyp of colon 12/23/2013  . Essential hypertension 11/02/2013  . Former smoker 11/26/2008    Past Surgical History:  Procedure Laterality  Date  . ABDOMINAL HYSTERECTOMY    . BREAST LUMPECTOMY  10/11   rt-neg  . BREAST SURGERY  2004   fibroademoma  . CERVICAL CONE BIOPSY  2000  . COLON RESECTION N/A 07/06/2014   Procedure: LAPAROSCOPIC LOOP ILEOSTOMY WITH PLACEMENT OF PELVIC DRAIN;  Surgeon: Donnie Mesa, MD;  Location: [facility];  Service: General;  Laterality: N/A;  . COLON SURGERY    . DILATION AND CURETTAGE OF UTERUS  2009  . DIVERTING ILEOSTOMY Right 06/2014  . ECTOPIC PREGNANCY SURGERY  2005   R salpyngectomy  . ILEOSTOMY N/A 10/26/2014   Procedure: LOOP ILEOSTOMY REVERSAL;  Surgeon: Donnie Mesa, MD;  Location: [facility];  Service: General;  Laterality: N/A;  . LAPAROSCOPIC SIGMOID COLECTOMY N/A 06/30/2014   Procedure: LAPAROSCOPIC ASSISTED SIGMOID COLECTOMY;  Surgeon: Donnie Mesa, MD;  Location: [place];  Service: General;  Laterality: N/A;  . MYOMECTOMY  2008   robotic  . POLYPECTOMY  2009  . ROBOTIC ASSISTED LAP VAGINAL HYSTERECTOMY  2010   supracervical  . SALPINGECTOMY  2005  . TRIGGER FINGER RELEASE Right 09/28/2012   Procedure: RELEASE TRIGGER FINGER/A-1 PULLEY RIGHT THUMB;  Surgeon: Jolyn Nap, MD;  Location: [facility];  Service: Orthopedics;  Laterality: Right;     OB History    Gravida  4   Para  2   Term  2   Preterm      AB  2   Living  2     SAB  0   TAB  1   Ectopic  1   Multiple  0   Live Births               Home Medications    Prior to Admission medications   Medication Sig Start Date End Date Taking? Authorizing Provider  amLODipine (NORVASC) 5 MG tablet Take 1 tablet (5 mg total) by mouth daily. 04/24/18 07/23/18  Adrian Prows, MD  atorvastatin (LIPITOR) 20 MG tablet Take 1 tablet (20 mg total) by mouth daily. 07/03/18   Rutherford Guys, MD  benzonatate (TESSALON) 100 MG capsule Take 1-2 capsules (100-200 mg total) by mouth 3 (three) times daily as needed for cough. Patient not taking: Reported on 04/24/2018 04/10/18   Rutherford Guys, MD  Black Cohosh  (REMIFEMIN) 20 MG TABS Take 40 mg by mouth daily.    [provider]  dextromethorphan 15 MG/5ML syrup Take 10 mLs (30 mg total) by mouth 4 (four) times daily as needed for cough. Patient not taking: Reported on 04/24/2018 04/18/18   Albrizze, Verline Lema E, PA-C  diclofenac sodium (VOLTAREN) 1 % GEL Apply 2 g topically 4 (four) times daily. 03/28/18   Norval Morton, MD  dicyclomine (BENTYL) 20 MG tablet Take 1 tablet (20 mg total) by mouth 2 (two) times daily. 04/26/18   Fawze, Mina A, PA-C  fluticasone (FLONASE) 50 MCG/ACT nasal spray Place 1 spray into both nostrils 2 (two) times daily. Patient not taking: Reported on 04/24/2018 04/10/18   Rutherford Guys, MD  guaiFENesin-codeine 100-10 MG/5ML syrup Take 10 mLs by mouth 3 (three) times daily as needed for cough. Patient not taking: Reported on 04/24/2018 04/21/18   Jacqlyn Larsen, PA-C  ibuprofen (ADVIL,MOTRIN) 800 MG tablet Take 1 tablet (800 mg total) by mouth every 8 (eight) hours as needed. 03/25/18   Wendie Agreste, MD  lisinopril (PRINIVIL,ZESTRIL) 10 MG tablet Take 1 tablet (10 mg total) by mouth daily. 06/01/18   Rutherford Guys, MD  Multiple Vitamins-Minerals (HAIR/SKIN/NAILS/BIOTIN PO) Take 1 tablet by mouth daily.    [provider]  nitroGLYCERIN (NITROSTAT) 0.4 MG SL tablet Place 1 tablet (0.4 mg total) under the tongue every 5 (five) minutes as needed for up to 25 days for chest pain. 04/02/18 04/27/18  Adrian Prows, MD  ondansetron (ZOFRAN ODT) 4 MG disintegrating tablet Take 1 tablet (4 mg total) by mouth every 8 (eight) hours as needed for nausea or vomiting. 04/26/18   Renita Papa, PA-C    Family History Family History  Problem Relation Age of Onset  . Hypertension Paternal Grandmother   . Diabetes Paternal Grandmother   . Hypertension Maternal Grandmother   . Sarcoidosis Mother   . Hypertension Mother   . Hyperlipidemia Mother   . Thyroid disease Maternal Aunt   . Mental illness Cousin   . Fibroids Sister      Social History Social History   Tobacco Use  . Smoking status: Former Smoker    Packs/day: 1.00    Years: 20.00    Pack years: 20.00    Types: Cigarettes    Quit date: 06/13/2011    Years since quitting: 7.1  . Smokeless tobacco: Never Used  Substance Use Topics  . Alcohol use: Not Currently  . Drug use: No     Allergies   Keflex [cephalexin] and Penicillins   Review of Systems Review of Systems  Constitutional: Negative  for chills and fever.  Skin: Positive for wound.     Physical Exam Updated Vital Signs BP 125/70 (BP Location: Right Arm)   Pulse 89   Temp 98 F (36.7 C) (Oral)   Resp 16   SpO2 96%   Physical Exam Vitals signs and nursing note reviewed.  Constitutional:      General: She is not in acute distress.    Appearance: She is well-developed. She is not diaphoretic.  HENT:     Head: Normocephalic and atraumatic.  Eyes:     General: No scleral icterus.    Conjunctiva/sclera: Conjunctivae normal.  Neck:     Musculoskeletal: Normal range of motion.  Cardiovascular:     Rate and Rhythm: Normal rate and regular rhythm.     Heart sounds: Normal heart sounds. No murmur. No friction rub. No gallop.   Pulmonary:     Effort: Pulmonary effort is normal. No respiratory distress.     Breath sounds: Normal breath sounds.  Abdominal:     General: Bowel sounds are normal. There is no distension.     Palpations: Abdomen is soft. There is no mass.     Tenderness: There is no abdominal tenderness. There is no guarding.  Musculoskeletal:     Comments: Well healing lac r 2nd toe 2 sutures in place  Skin:    General: Skin is warm and dry.  Neurological:     Mental Status: She is alert and oriented to person, place, and time.  Psychiatric:        Behavior: Behavior normal.      ED Treatments / Results  Labs (all labs ordered are listed, but only abnormal results are displayed) Labs Reviewed - No data to display  EKG None  Radiology No results found.   Procedures .Suture Removal  Date/Time: 08/15/2018 7:57 AM Performed by: Margarita Mail, PA-C Authorized by: Margarita Mail, PA-C   Consent:    Consent obtained:  Verbal   Consent given by:  Patient   Risks discussed:  Pain and wound separation   Alternatives discussed:  No treatment Location:    Location:  Lower extremity   Lower extremity location:  Toe   Toe location:  R second toe Procedure details:    Wound appearance:  No signs of infection, good wound healing, nontender and pink   Number of sutures removed:  2 Post-procedure details:    Patient tolerance of procedure:  Tolerated well, no immediate complications   (including critical care time)  Medications Ordered in ED Medications - No data to display   Initial Impression / Assessment and Plan / ED Course  I have reviewed the triage vital signs and the nursing notes.  Pertinent labs & imaging results that were available during my care of the patient were reviewed by me and considered in my medical decision making (see chart for details).        Staple removal   Pt to ER for staple/suture removal and wound check as above. Procedure tolerated well. Vitals normal, no signs of infection. Scar minimization & return precautions given at dc.    Final Clinical Impressions(s) / ED Diagnoses   Final diagnoses:  Visit for suture removal    ED Discharge Orders    None       Margarita Mail, PA-C 08/15/18 0801    Sherwood Gambler, MD 08/18/18 [phone]

## 2018-08-15 NOTE — Discharge Instructions (Addendum)
Get help right away if: You have a fever. You have redness that is spreading from your wound. 

## 2018-08-15 NOTE — ED Triage Notes (Signed)
Pt here for suture removal from R 2nd toe.  Reports still having pain 5/10.

## 2018-08-15 NOTE — ED Notes (Signed)
Pt verbalized understanding of discharge instructions and follow up. Pt ambulatory to lobby with steady gait and tolerated well.

## 2018-09-01 ENCOUNTER — Encounter: Payer: Self-pay | Admitting: Family Medicine

## 2018-09-01 ENCOUNTER — Encounter: Payer: 59 | Admitting: Family Medicine

## 2018-09-01 ENCOUNTER — Emergency Department (HOSPITAL_COMMUNITY): Payer: 59

## 2018-09-01 ENCOUNTER — Other Ambulatory Visit: Payer: Self-pay

## 2018-09-01 ENCOUNTER — Encounter (HOSPITAL_COMMUNITY): Payer: Self-pay | Admitting: Emergency Medicine

## 2018-09-01 ENCOUNTER — Emergency Department (HOSPITAL_COMMUNITY)
Admission: EM | Admit: 2018-09-01 | Discharge: 2018-09-01 | Disposition: A | Discharge status: Home / Self Care | Payer: 59 | Attending: Emergency Medicine | Admitting: Emergency Medicine

## 2018-09-01 DIAGNOSIS — Z79899 Other long term (current) drug therapy: Secondary | ICD-10-CM | POA: Insufficient documentation

## 2018-09-01 DIAGNOSIS — R1032 Left lower quadrant pain: Secondary | ICD-10-CM | POA: Diagnosis not present

## 2018-09-01 DIAGNOSIS — R197 Diarrhea, unspecified: Secondary | ICD-10-CM | POA: Insufficient documentation

## 2018-09-01 DIAGNOSIS — Z87891 Personal history of nicotine dependence: Secondary | ICD-10-CM | POA: Diagnosis not present

## 2018-09-01 DIAGNOSIS — E669 Obesity, unspecified: Secondary | ICD-10-CM | POA: Diagnosis not present

## 2018-09-01 DIAGNOSIS — Z6836 Body mass index (BMI) 36.0-36.9, adult: Secondary | ICD-10-CM | POA: Insufficient documentation

## 2018-09-01 DIAGNOSIS — K5730 Diverticulosis of large intestine without perforation or abscess without bleeding: Secondary | ICD-10-CM | POA: Diagnosis not present

## 2018-09-01 DIAGNOSIS — Z9089 Acquired absence of other organs: Secondary | ICD-10-CM | POA: Diagnosis not present

## 2018-09-01 DIAGNOSIS — R110 Nausea: Secondary | ICD-10-CM | POA: Diagnosis not present

## 2018-09-01 LAB — COMPREHENSIVE METABOLIC PANEL
ALT: 24 U/L (ref 0–44)
AST: 25 U/L (ref 15–41)
Albumin: 4.3 g/dL (ref 3.5–5.0)
Alkaline Phosphatase: 61 U/L (ref 38–126)
Anion gap: 10 (ref 5–15)
BUN: 11 mg/dL (ref 6–20)
CO2: 25 mmol/L (ref 22–32)
Calcium: 9.9 mg/dL (ref 8.9–10.3)
Chloride: 104 mmol/L (ref 98–111)
Creatinine, Ser: 0.91 mg/dL (ref 0.44–1.00)
GFR calc Af Amer: >60 mL/min (ref >60)
GFR calc non Af Amer: >60 mL/min (ref >60)
Glucose, Bld: 100 mg/dL — ABNORMAL HIGH (ref 70–99)
Potassium: 3.8 mmol/L (ref 3.5–5.1)
Sodium: 139 mmol/L (ref 135–145)
Total Bilirubin: 0.9 mg/dL (ref 0.3–1.2)
Total Protein: 8 g/dL (ref 6.5–8.1)

## 2018-09-01 LAB — URINALYSIS, ROUTINE W REFLEX MICROSCOPIC
Bilirubin Urine: NEGATIVE
Glucose, UA: NEGATIVE mg/dL
Hgb urine dipstick: NEGATIVE
Ketones, ur: NEGATIVE mg/dL
Leukocytes,Ua: NEGATIVE
Nitrite: NEGATIVE
Protein, ur: NEGATIVE mg/dL
Specific Gravity, Urine: 1.02 (ref 1.005–1.030)
pH: 7 (ref 5.0–8.0)

## 2018-09-01 LAB — CBC WITH DIFFERENTIAL/PLATELET
Abs Immature Granulocytes: 0.01 10*3/uL (ref 0.00–0.07)
Basophils Absolute: 0 10*3/uL (ref 0.0–0.1)
Basophils Relative: 1 %
Eosinophils Absolute: 0.1 10*3/uL (ref 0.0–0.5)
Eosinophils Relative: 1 %
HCT: 42.5 % (ref 36.0–46.0)
Hemoglobin: 13.3 g/dL (ref 12.0–15.0)
Immature Granulocytes: 0 %
Lymphocytes Relative: 35 %
Lymphs Abs: 2.1 10*3/uL (ref 0.7–4.0)
MCH: 29.6 pg (ref 26.0–34.0)
MCHC: 31.3 g/dL (ref 30.0–36.0)
MCV: 94.7 fL (ref 80.0–100.0)
Monocytes Absolute: 0.8 10*3/uL (ref 0.1–1.0)
Monocytes Relative: 13 %
Neutro Abs: 3.1 10*3/uL (ref 1.7–7.7)
Neutrophils Relative %: 50 %
Platelets: 344 10*3/uL (ref 150–400)
RBC: 4.49 MIL/uL (ref 3.87–5.11)
RDW: 12.7 % (ref 11.5–15.5)
WBC: 6.2 10*3/uL (ref 4.0–10.5)
nRBC: 0 % (ref 0.0–0.2)

## 2018-09-01 LAB — LIPASE, BLOOD: Lipase: 31 U/L (ref 11–51)

## 2018-09-01 MED ORDER — ONDANSETRON HCL 4 MG/2ML IJ SOLN
4.0000 mg | Freq: Once | INTRAMUSCULAR | Status: AC
  Administered 2018-09-01: 11:00:00 4 mg via INTRAVENOUS
  Filled 2018-09-01: qty 2

## 2018-09-01 MED ORDER — IOHEXOL 300 MG/ML  SOLN
100.0000 mL | Freq: Once | INTRAMUSCULAR | Status: AC | PRN
  Administered 2018-09-01: 100 mL via INTRAVENOUS

## 2018-09-01 MED ORDER — SODIUM CHLORIDE 0.9 % IV BOLUS
1000.0000 mL | Freq: Once | INTRAVENOUS | Status: AC
  Administered 2018-09-01: 1000 mL via INTRAVENOUS

## 2018-09-01 MED ORDER — HYDROCODONE-ACETAMINOPHEN 5-325 MG PO TABS
1.0000 | ORAL_TABLET | Freq: Once | ORAL | Status: AC
  Administered 2018-09-01: 1 via ORAL
  Filled 2018-09-01: qty 1

## 2018-09-01 MED ORDER — MORPHINE SULFATE (PF) 4 MG/ML IV SOLN
4.0000 mg | Freq: Once | INTRAVENOUS | Status: AC
  Administered 2018-09-01: 4 mg via INTRAVENOUS
  Filled 2018-09-01: qty 1

## 2018-09-01 MED ORDER — ONDANSETRON 4 MG PO TBDP: 4.0000 mg | ORAL_TABLET | Freq: Three times a day (TID) | ORAL | 0 refills | Status: DC | PRN

## 2018-09-01 NOTE — ED Provider Notes (Signed)
[facility] EMERGENCY DEPARTMENT Provider Note   CSN: [id number] Arrival date & time: 09/01/18  3614    History   Chief Complaint Chief Complaint  Patient presents with  . Abdominal Pain    HPI [name] is a 48 y.o. female with past medical history of sigmoid colectomy, loop ileostomy status post reversal in 2016, diverticulitis, hysterectomy, presenting to the emergency department with complaint of sudden onset of left lower quadrant abdominal pain began around 2 AM this morning.  She states she woke up with sharp pain in her left lower quadrant that radiates across her abdomen.  She had multiple episodes of watery diarrhea, most recent episode has some bright red blood present.  Patient states she has associated nausea without vomiting.  Her symptoms feel very similar to prior episodes of diverticulitis.  She has not taken any medications to treat her symptoms.  No associated fevers, chills, urinary symptoms.     The history is provided by the patient and medical records.    Past Medical History:  Diagnosis Date  . Abnormal Pap smear 2000   [facility]  . Allergy   . Back pain   . Breast fibroadenoma   . Diverticulitis of colon with perforation 05/20/2014  . GERD (gastroesophageal reflux disease)   . H/O metrorrhagia 12/2006  . H/O myomectomy 07-2006   robotic  . H/O sinusitis   . Headache(784.0)   . History of conization of cervix 2000  . History of ectopic pregnancy 2005   r salpyngectomy  . History of hysterectomy, supracervical 08-2008   robotic  . History of syphilis   . History of syphilis   . Hx of adenomatous polyp of colon 12/31/2017  . Hx of herpes simplex type 2 infection   . Hyperlipidemia   . Hypertension   . Ileostomy present ([place]) 10/26/2014  . Intra-abdominal abscess ([place]) 07/21/2014  . PMS (premenstrual syndrome)     Patient Active Problem List   Diagnosis Date Noted  . Obesity (BMI 30-39.9) 03/29/2018  . Acute chest pain  03/27/2018  . Nonspecific abnormal electrocardiogram (ECG) (EKG) 03/27/2018  . Atypical chest pain 03/27/2018  . Hand pain, right 03/25/2018  . Dyslipidemia 08/15/2017  . History of hysterectomy, supracervical 02/14/2017  . Trigger ring finger of right hand 08/07/2015  . Primary osteoarthritis of first carpometacarpal joint of right hand 08/07/2015  . Diverticulitis of sigmoid colon s/p colectomy 06/30/2014 07/26/2014  . Tachycardia 02/04/2014  . Hx of adenomatous polyp of colon 12/23/2013  . Essential hypertension 11/02/2013  . Former smoker 11/26/2008    Past Surgical History:  Procedure Laterality Date  . ABDOMINAL HYSTERECTOMY    . BREAST LUMPECTOMY  10/11   rt-neg  . BREAST SURGERY  2004   fibroademoma  . CERVICAL CONE BIOPSY  2000  . COLON RESECTION N/A 07/06/2014   Procedure: LAPAROSCOPIC LOOP ILEOSTOMY WITH PLACEMENT OF PELVIC DRAIN;  Surgeon: Donnie Mesa, MD;  Location: [facility];  Service: General;  Laterality: N/A;  . COLON SURGERY    . DILATION AND CURETTAGE OF UTERUS  2009  . DIVERTING ILEOSTOMY Right 06/2014  . ECTOPIC PREGNANCY SURGERY  2005   R salpyngectomy  . ILEOSTOMY N/A 10/26/2014   Procedure: LOOP ILEOSTOMY REVERSAL;  Surgeon: Donnie Mesa, MD;  Location: [facility];  Service: General;  Laterality: N/A;  . LAPAROSCOPIC SIGMOID COLECTOMY N/A 06/30/2014   Procedure: LAPAROSCOPIC ASSISTED SIGMOID COLECTOMY;  Surgeon: Donnie Mesa, MD;  Location: [facility];  Service: General;  Laterality: N/A;  .  MYOMECTOMY  2008   robotic  . POLYPECTOMY  2009  . ROBOTIC ASSISTED LAP VAGINAL HYSTERECTOMY  2010   supracervical  . SALPINGECTOMY  2005  . TRIGGER FINGER RELEASE Right 09/28/2012   Procedure: RELEASE TRIGGER FINGER/A-1 PULLEY RIGHT THUMB;  Surgeon: Jolyn Nap, MD;  Location: [facility];  Service: Orthopedics;  Laterality: Right;     OB History    Gravida  4   Para  2   Term  2   Preterm      AB  2   Living  2     SAB  0   TAB  1   Ectopic   1   Multiple  0   Live Births               Home Medications    Prior to Admission medications   Medication Sig Start Date End Date Taking? Authorizing Provider  amLODipine (NORVASC) 5 MG tablet Take 1 tablet (5 mg total) by mouth daily. 04/24/18 09/01/18 Yes Adrian Prows, MD  atorvastatin (LIPITOR) 20 MG tablet Take 1 tablet (20 mg total) by mouth daily. 07/03/18  Yes Rutherford Guys, MD  Black Cohosh (REMIFEMIN) 20 MG TABS Take 40 mg by mouth daily.   Yes [provider]  ibuprofen (ADVIL,MOTRIN) 800 MG tablet Take 1 tablet (800 mg total) by mouth every 8 (eight) hours as needed. 03/25/18  Yes Wendie Agreste, MD  lisinopril (PRINIVIL,ZESTRIL) 10 MG tablet Take 1 tablet (10 mg total) by mouth daily. 06/01/18  Yes Rutherford Guys, MD  Multiple Vitamins-Minerals (HAIR/SKIN/NAILS/BIOTIN PO) Take 1 tablet by mouth daily.   Yes [provider]  nitroGLYCERIN (NITROSTAT) 0.4 MG SL tablet Place 1 tablet (0.4 mg total) under the tongue every 5 (five) minutes as needed for up to 25 days for chest pain. 04/02/18 09/01/18 Yes Adrian Prows, MD  benzonatate (TESSALON) 100 MG capsule Take 1-2 capsules (100-200 mg total) by mouth 3 (three) times daily as needed for cough. Patient not taking: Reported on 04/24/2018 04/10/18   Rutherford Guys, MD  dextromethorphan 15 MG/5ML syrup Take 10 mLs (30 mg total) by mouth 4 (four) times daily as needed for cough. Patient not taking: Reported on 04/24/2018 04/18/18   Albrizze, Verline Lema E, PA-C  diclofenac sodium (VOLTAREN) 1 % GEL Apply 2 g topically 4 (four) times daily. Patient not taking: Reported on 09/01/2018 03/28/18   Norval Morton, MD  dicyclomine (BENTYL) 20 MG tablet Take 1 tablet (20 mg total) by mouth 2 (two) times daily. Patient not taking: Reported on 09/01/2018 04/26/18   Rodell Perna A, PA-C  fluticasone (FLONASE) 50 MCG/ACT nasal spray Place 1 spray into both nostrils 2 (two) times daily. Patient not taking: Reported on 04/24/2018 04/10/18    Rutherford Guys, MD  guaiFENesin-codeine 100-10 MG/5ML syrup Take 10 mLs by mouth 3 (three) times daily as needed for cough. Patient not taking: Reported on 04/24/2018 04/21/18   Jacqlyn Larsen, PA-C  ondansetron (ZOFRAN ODT) 4 MG disintegrating tablet Take 1 tablet (4 mg total) by mouth every 8 (eight) hours as needed for nausea or vomiting. 09/01/18   Ziara Thelander, [place] N, PA-C    Family History Family History  Problem Relation Age of Onset  . Hypertension Paternal Grandmother   . Diabetes Paternal Grandmother   . Hypertension Maternal Grandmother   . Sarcoidosis Mother   . Hypertension Mother   . Hyperlipidemia Mother   . Thyroid disease Maternal Aunt   .  Mental illness Cousin   . Fibroids Sister     Social History Social History   Tobacco Use  . Smoking status: Former Smoker    Packs/day: 1.00    Years: 20.00    Pack years: 20.00    Types: Cigarettes    Quit date: 06/13/2011    Years since quitting: 7.2  . Smokeless tobacco: Never Used  Substance Use Topics  . Alcohol use: Not Currently  . Drug use: No     Allergies   Keflex [cephalexin] and Penicillins   Review of Systems Review of Systems  Gastrointestinal: Positive for abdominal pain, diarrhea and nausea.  All other systems reviewed and are negative.    Physical Exam Updated Vital Signs BP 128/73 (BP Location: Right Arm)   Pulse 83   Temp 98.2 F (36.8 C) (Oral)   Resp 16   Ht 5\' 4"  (1.626 m)   Wt 97.1 kg   SpO2 97%   BMI 36.73 kg/m   Physical Exam Vitals signs and nursing note reviewed.  Constitutional:      General: She is not in acute distress.    Appearance: She is well-developed. She is obese. She is not ill-appearing.  HENT:     Head: Normocephalic and atraumatic.     Mouth/Throat:     Mouth: Mucous membranes are moist.  Eyes:     Conjunctiva/sclera: Conjunctivae normal.  Cardiovascular:     Rate and Rhythm: Normal rate and regular rhythm.  Pulmonary:     Effort: Pulmonary effort is  normal. No respiratory distress.     Breath sounds: Normal breath sounds.  Abdominal:     General: A surgical scar is present. Bowel sounds are normal.     Palpations: Abdomen is soft.     Tenderness: There is abdominal tenderness in the left lower quadrant. There is no guarding or rebound.  Skin:    General: Skin is warm.  Neurological:     Mental Status: She is alert.  Psychiatric:        Behavior: Behavior normal.      ED Treatments / Results  Labs (all labs ordered are listed, but only abnormal results are displayed) Labs Reviewed  COMPREHENSIVE METABOLIC PANEL - Abnormal; Notable for the following components:      Result Value   Glucose, Bld 100 (*)    All other components within normal limits  URINALYSIS, ROUTINE W REFLEX MICROSCOPIC - Abnormal; Notable for the following components:   APPearance HAZY (*)    All other components within normal limits  CBC WITH DIFFERENTIAL/PLATELET  LIPASE, BLOOD    EKG None  Radiology Ct Abdomen Pelvis W Contrast  Result Date: 09/01/2018 CLINICAL DATA:  Acute left lower quadrant abdominal pain. EXAM: CT ABDOMEN AND PELVIS WITH CONTRAST TECHNIQUE: Multidetector CT imaging of the abdomen and pelvis was performed using the standard protocol following bolus administration of intravenous contrast. CONTRAST:  140mL OMNIPAQUE IOHEXOL 300 MG/ML  SOLN COMPARISON:  CT scan of April 25, 2018. FINDINGS: Lower chest: No acute abnormality. Hepatobiliary: No focal liver abnormality is seen. No gallstones, gallbladder wall thickening, or biliary dilatation. Pancreas: Unremarkable. No pancreatic ductal dilatation or surrounding inflammatory changes. Spleen: Normal in size without focal abnormality. Adrenals/Urinary Tract: Adrenal glands are unremarkable. Kidneys are normal, without renal calculi, focal lesion, or hydronephrosis. Bladder is unremarkable. Stomach/Bowel: The stomach and appendix are unremarkable. Status post partial sigmoid colectomy. There is  no evidence of bowel obstruction. Diverticulosis of proximal sigmoid colon is noted without inflammation.  Vascular/Lymphatic: Aortic atherosclerosis. No enlarged abdominal or pelvic lymph nodes. Reproductive: Status post hysterectomy. No adnexal masses. Other: No abdominal wall hernia or abnormality. No abdominopelvic ascites. Musculoskeletal: No acute or significant osseous findings. IMPRESSION: Status post partial sigmoid colectomy. Diverticulosis of proximal sigmoid colon is noted without acute inflammation. No acute abnormality seen in the abdomen or pelvis. Aortic Atherosclerosis (ICD10-I70.0). Electronically Signed   By: Marijo Conception M.D.   On: 09/01/2018 13:17    Procedures Procedures (including critical care time)  Medications Ordered in ED Medications  HYDROcodone-acetaminophen (NORCO/VICODIN) 5-325 MG per tablet 1 tablet (has no administration in time range)  sodium chloride 0.9 % bolus 1,000 mL (0 mLs Intravenous Stopped 09/01/18 1254)  morphine 4 MG/ML injection 4 mg (4 mg Intravenous Given 09/01/18 1112)  ondansetron (ZOFRAN) injection 4 mg (4 mg Intravenous Given 09/01/18 1112)  iohexol (OMNIPAQUE) 300 MG/ML solution 100 mL (100 mLs Intravenous Contrast Given 09/01/18 1259)     Initial Impression / Assessment and Plan / ED Course  I have reviewed the triage vital signs and the nursing notes.  Pertinent labs & imaging results that were available during my care of the patient were reviewed by me and considered in my medical decision making (see chart for details).        Patient presenting with sudden onset of LLQ abdominal pain that began early this morning. Associated symptoms include diarrhea and nausea. Patient is nontoxic, nonseptic appearing, in no apparent distress. Abdomen is soft and TTp in the LLQ, no guarding or rebound.   Labs, imaging and vitals reviewed.  CT is neg for diverticulitis, colitis, or other acute intra-abdominal pathology. Patient does not meet the SIRS  or Sepsis criteria.  On repeat exam patient does not have a surgical abdomen and there are no peritoneal signs.  Suspect viral in nature.  Patient's pain and other symptoms adequately managed in emergency department. Patient discharged home with symptomatic treatment and given strict instructions for follow-up with their primary care physician.  Pt safe for discharge. D  Discussed results, findings, treatment and follow up. Patient advised of return precautions. Patient verbalized understanding and agreed with plan.  Final Clinical Impressions(s) / ED Diagnoses   Final diagnoses:  Left lower quadrant abdominal pain  Diarrhea, unspecified type    ED Discharge Orders         Ordered    ondansetron (ZOFRAN ODT) 4 MG disintegrating tablet  Every 8 hours PRN     09/01/18 1518           Shaniya Tashiro, [place] N, PA-C 09/01/18 1521    Virgel Manifold, MD 09/02/18 1005

## 2018-09-01 NOTE — Discharge Instructions (Addendum)
Please read instructions below. Drink clear liquids until your stomach feels better. Then, slowly introduce bland foods into your diet as tolerated, such as bread, rice, apples, bananas. You can take zofran every 8 hours as needed for nausea. Follow up with your primary care if symptoms persist. Return to the ER for severe abdominal pain, fever, uncontrollable vomiting, or new or concerning symptoms.

## 2018-09-01 NOTE — ED Notes (Signed)
Patient transported to CT 

## 2018-09-01 NOTE — Patient Instructions (Signed)
° ° ° °  If you have lab work done today you will be contacted with your lab results within the next 2 weeks.  If you have not heard from us then please contact us. The fastest way to get your results is to register for My Chart. ° ° °IF you received an x-ray today, you will receive an invoice from [facility]. Please contact  Radiology at [phone] with questions or concerns regarding your invoice.  ° °IF you received labwork today, you will receive an invoice from [facility]. Please contact [facility] at [phone] with questions or concerns regarding your invoice.  ° °Our billing staff will not be able to assist you with questions regarding bills from these companies. ° °You will be contacted with the lab results as soon as they are available. The fastest way to get your results is to activate your My Chart account. Instructions are located on the last page of this paperwork. If you have not heard from us regarding the results in 2 weeks, please contact this office. °  ° ° ° °

## 2018-09-01 NOTE — ED Notes (Signed)
EDP in room at discharge,  Discharge education provided.Prescription discussed. Patient ambulated at discharge, no further questions at this time.

## 2018-09-01 NOTE — ED Triage Notes (Signed)
Pt reports LLQ abdominal pain since 2am. Pt reports multiple episodes of diarrhea, some with blood in them. Reports nausea but no vomiting. Hx of colectomy in 2016 due to diverticulitis. VSS.

## 2018-09-01 NOTE — ED Notes (Signed)
Pt restful with no concerns.  To CT scan.  IV bolus complete.

## 2018-09-02 ENCOUNTER — Ambulatory Visit: Payer: 59 | Admitting: Family Medicine

## 2018-09-02 MED FILL — LISINOPRIL 10 MG TABLET: 10 | 90 days supply | Qty: 90 | Fill #1

## 2018-09-02 NOTE — Progress Notes (Signed)
This encounter was created in error - please disregard.

## 2018-09-10 MED FILL — AMLODIPINE BESYLATE 5 MG TA: 5 | 90 days supply | Qty: 90 | Fill #1

## 2018-09-17 ENCOUNTER — Other Ambulatory Visit: Payer: Self-pay

## 2018-09-17 ENCOUNTER — Ambulatory Visit (INDEPENDENT_AMBULATORY_CARE_PROVIDER_SITE_OTHER): Payer: 59 | Admitting: Neurology

## 2018-09-17 DIAGNOSIS — G5621 Lesion of ulnar nerve, right upper limb: Secondary | ICD-10-CM

## 2018-09-17 NOTE — Procedures (Signed)
[facility]  [place], [address]  [place], [place] [address] Tel: [phone] Fax:  [phone] Test Date:  09/17/2018  Patient: [name] DOB: 1970/06/02 Physician: Narda Amber, DO  Sex: Female Height: 5\' 5"  Ref Phys: Berle Mull, MD  ID#: [id number] Temp: 34.0C Technician:    Patient Complaints: This is a 48 year old female referred for evaluation of right hand numbness and tingling, concerning for ulnar neuropathy.  NCV & EMG Findings: Extensive electrodiagnostic testing of the right upper extremity shows:  1. Right median, ulnar, and mixed palmar sensory responses are within normal limits. 2. Right median motor responses within normal limits.  Right ulnar motor response shows decreased conduction velocity across the elbow (A Elbow-B Elbow, 45 m/s), with normal latency and amplitude. 3. There is no evidence of active or chronic motor axonal loss changes affecting any of the tested muscles.  Motor unit configuration and recruitment pattern is within normal limits.  Impression: 1. Right ulnar neuropathy with slowing across the elbow, purely demyelinating in type, mild. 2. There is no evidence of carpal tunnel syndrome or cervical radiculopathy affecting the right upper extremity.   ___________________________ Narda Amber, DO    Nerve Conduction Studies Anti Sensory Summary Table   Site NR Peak (ms) Norm Peak (ms) P-T Amp (V) Norm P-T Amp  Right Median Anti Sensory (2nd Digit)  34C  Wrist    2.7 <3.4 48.8 >20  Right Ulnar Anti Sensory (5th Digit)  34C  Wrist    2.4 <3.1 41.5 >12   Motor Summary Table   Site NR Onset (ms) Norm Onset (ms) O-P Amp (mV) Norm O-P Amp Site1 Site2 Delta-0 (ms) Dist (cm) Vel (m/s) Norm Vel (m/s)  Right Median Motor (Abd Poll Brev)  34C  Wrist    2.3 <3.9 13.9 >6 Elbow Wrist 4.2 28.0 67 >50  Elbow    6.5  13.7         Right Ulnar Motor (Abd Dig Minimi)  34C  Wrist    1.9 <3.1 9.0 >7 B Elbow Wrist 3.3 23.0 70  >50  B Elbow    5.2  8.4  A Elbow B Elbow 2.2 10.0 45 >50  A Elbow    7.4  8.0          Comparison Summary Table   Site NR Peak (ms) Norm Peak (ms) P-T Amp (V) Site1 Site2 Delta-P (ms) Norm Delta (ms)  Right Median/Ulnar Palm Comparison (Wrist - 8cm)  34C  Median Palm    1.6 <2.2 41.4 Median Palm Ulnar Palm 0.2   Ulnar Palm    1.4 <2.2 15.2       EMG   Side Muscle Ins Act Fibs Psw Fasc Number Recrt Dur Dur. Amp Amp. Poly Poly. Comment  Right 1stDorInt Nml Nml Nml Nml Nml Nml Nml Nml Nml Nml Nml Nml N/A  Right PronatorTeres Nml Nml Nml Nml Nml Nml Nml Nml Nml Nml Nml Nml N/A  Right Biceps Nml Nml Nml Nml Nml Nml Nml Nml Nml Nml Nml Nml N/A  Right Triceps Nml Nml Nml Nml Nml Nml Nml Nml Nml Nml Nml Nml N/A  Right Deltoid Nml Nml Nml Nml Nml Nml Nml Nml Nml Nml Nml Nml N/A  Right ABD Dig Min Nml Nml Nml Nml Nml Nml Nml Nml Nml Nml Nml Nml N/A  Right FlexCarpiUln Nml Nml Nml Nml Nml Nml Nml Nml Nml Nml Nml Nml N/A      Waveforms:

## 2018-09-24 ENCOUNTER — Other Ambulatory Visit: Payer: Self-pay | Admitting: Obstetrics and Gynecology

## 2018-09-24 DIAGNOSIS — Z1231 Encounter for screening mammogram for malignant neoplasm of breast: Secondary | ICD-10-CM

## 2018-10-06 ENCOUNTER — Other Ambulatory Visit: Payer: Self-pay | Admitting: Family Medicine

## 2018-10-06 MED FILL — ATORVASTATIN 20 MG TABLET: 20 | 90 days supply | Qty: 90 | Fill #0

## 2018-10-06 NOTE — Telephone Encounter (Signed)
Requested Prescriptions  Pending Prescriptions Disp Refills  . atorvastatin (LIPITOR) 20 MG tablet [Pharmacy Med Name: ATORVASTATIN 20 MG TABLET 20 TAB] 90 tablet 0    Sig: TAKE 1 TABLET BY MOUTH DAILY.     Cardiovascular:  Antilipid - Statins Passed - 10/06/2018 11:14 AM      Passed - Total Cholesterol in normal range and within 360 days    Cholesterol, Total  Date Value Ref Range Status  08/15/2017 182 100 - 199 mg/dL Final   Cholesterol  Date Value Ref Range Status  03/28/2018 162 0 - 200 mg/dL Final         Passed - LDL in normal range and within 360 days    LDL Calculated  Date Value Ref Range Status  08/15/2017 93 0 - 99 mg/dL Final   LDL Cholesterol  Date Value Ref Range Status  03/28/2018 82 0 - 99 mg/dL Final    Comment:           Total Cholesterol/HDL:CHD Risk Coronary Heart Disease Risk Table                     Men   Women  1/2 Average Risk   3.4   3.3  Average Risk       5.0   4.4  2 X Average Risk   9.6   7.1  3 X Average Risk  23.4   11.0        Use the calculated Patient Ratio above and the CHD Risk Table to determine the patient's CHD Risk.        ATP III CLASSIFICATION (LDL):  <100     mg/dL   Optimal  100-129  mg/dL   Near or Above                    Optimal  130-159  mg/dL   Borderline  160-189  mg/dL   High  >190     mg/dL   Very High Performed at [facility] [address] [place],  [address]          Passed - HDL in normal range and within 360 days    HDL  Date Value Ref Range Status  03/28/2018 59 >40 mg/dL Final  08/15/2017 66 >39 mg/dL Final         Passed - Triglycerides in normal range and within 360 days    Triglycerides  Date Value Ref Range Status  03/28/2018 106 <150 mg/dL Final         Passed - Patient is not pregnant      Passed - Valid encounter within last 12 months    Recent Outpatient Visits          5 months ago Cough   Primary Care at [facility], [place], MD   5 months ago Acute upper  respiratory infection   [facility] at [facility], Lilia Argue, MD   6 months ago Acute chest pain   Primary Care at [facility], MD   6 months ago Hand pain, right   Primary Care at [place], MD   1 year ago Pain of left hip joint   Primary Care at [facility], Gelene Mink, PA-C      Future Appointments            In 2 days Ethelene Hal, Mortimer Fries, MD [facility] [place], [place]   In 3  weeks Adrian Prows, MD [facility]

## 2018-10-07 ENCOUNTER — Telehealth: Payer: Self-pay

## 2018-10-07 NOTE — Telephone Encounter (Signed)

## 2018-10-08 ENCOUNTER — Encounter: Payer: Self-pay | Admitting: Family Medicine

## 2018-10-08 ENCOUNTER — Ambulatory Visit (INDEPENDENT_AMBULATORY_CARE_PROVIDER_SITE_OTHER): Payer: 59 | Admitting: Family Medicine

## 2018-10-08 VITALS — BP 118/70 | HR 92 | Temp 96.2°F | Ht 64.0 in | Wt 211.4 lb

## 2018-10-08 DIAGNOSIS — R7303 Prediabetes: Secondary | ICD-10-CM | POA: Insufficient documentation

## 2018-10-08 DIAGNOSIS — I10 Essential (primary) hypertension: Secondary | ICD-10-CM | POA: Diagnosis not present

## 2018-10-08 DIAGNOSIS — Z0000 Encounter for general adult medical examination without abnormal findings: Secondary | ICD-10-CM | POA: Insufficient documentation

## 2018-10-08 DIAGNOSIS — Z23 Encounter for immunization: Secondary | ICD-10-CM | POA: Diagnosis not present

## 2018-10-08 DIAGNOSIS — R7309 Other abnormal glucose: Secondary | ICD-10-CM | POA: Insufficient documentation

## 2018-10-08 DIAGNOSIS — E7800 Pure hypercholesterolemia, unspecified: Secondary | ICD-10-CM | POA: Diagnosis not present

## 2018-10-08 LAB — COMPREHENSIVE METABOLIC PANEL
ALT: 21 U/L (ref 0–35)
AST: 22 U/L (ref 0–37)
Albumin: 4.7 g/dL (ref 3.5–5.2)
Alkaline Phosphatase: 61 U/L (ref 39–117)
BUN: 11 mg/dL (ref 6–23)
CO2: 26 mEq/L (ref 19–32)
Calcium: 10 mg/dL (ref 8.4–10.5)
Chloride: 103 mEq/L (ref 96–112)
Creatinine, Ser: 0.74 mg/dL (ref 0.40–1.20)
GFR: 100.7 mL/min (ref >60.00)
Glucose, Bld: 97 mg/dL (ref 70–99)
Potassium: 3.6 mEq/L (ref 3.5–5.1)
Sodium: 139 mEq/L (ref 135–145)
Total Bilirubin: 0.5 mg/dL (ref 0.2–1.2)
Total Protein: 7.6 g/dL (ref 6.0–8.3)

## 2018-10-08 LAB — URINALYSIS, ROUTINE W REFLEX MICROSCOPIC
Bilirubin Urine: NEGATIVE
Hgb urine dipstick: NEGATIVE
Ketones, ur: NEGATIVE
Leukocytes,Ua: NEGATIVE
Nitrite: NEGATIVE
Specific Gravity, Urine: >=1.03 — AB (ref 1.000–1.030)
Total Protein, Urine: NEGATIVE
Urine Glucose: NEGATIVE
Urobilinogen, UA: 0.2 (ref 0.0–1.0)
pH: 6 (ref 5.0–8.0)

## 2018-10-08 LAB — HEMOGLOBIN A1C: Hgb A1c MFr Bld: 5.7 % (ref 4.6–6.5)

## 2018-10-08 LAB — CBC
HCT: 39.7 % (ref 36.0–46.0)
Hemoglobin: 13.2 g/dL (ref 12.0–15.0)
MCHC: 33.2 g/dL (ref 30.0–36.0)
MCV: 89.1 fl (ref 78.0–100.0)
Platelets: 312 10*3/uL (ref 150.0–400.0)
RBC: 4.46 Mil/uL (ref 3.87–5.11)
RDW: 13 % (ref 11.5–15.5)
WBC: 5.6 10*3/uL (ref 4.0–10.5)

## 2018-10-08 LAB — LIPID PANEL
Cholesterol: 180 mg/dL (ref 0–200)
HDL: 64.1 mg/dL (ref >39.00)
LDL Cholesterol: 102 mg/dL — ABNORMAL HIGH (ref 0–99)
NonHDL: 116.1
Total CHOL/HDL Ratio: 3
Triglycerides: 70 mg/dL (ref 0.0–149.0)
VLDL: 14 mg/dL (ref 0.0–40.0)

## 2018-10-08 NOTE — Patient Instructions (Signed)
Health Maintenance, Female Adopting a healthy lifestyle and getting preventive care are important in promoting health and wellness. Ask your health care provider about:  The right schedule for you to have regular tests and exams.  Things you can do on your own to prevent diseases and keep yourself healthy. What should I know about diet, weight, and exercise? Eat a healthy diet   Eat a diet that includes plenty of vegetables, fruits, low-fat dairy products, and lean protein.  Do not eat a lot of foods that are high in solid fats, added sugars, or sodium. Maintain a healthy weight Body mass index (BMI) is used to identify weight problems. It estimates body fat based on height and weight. Your health care provider can help determine your BMI and help you achieve or maintain a healthy weight. Get regular exercise Get regular exercise. This is one of the most important things you can do for your health. Most adults should:  Exercise for at least 150 minutes each week. The exercise should increase your heart rate and make you sweat (moderate-intensity exercise).  Do strengthening exercises at least twice a week. This is in addition to the moderate-intensity exercise.  Spend less time sitting. Even light physical activity can be beneficial. Watch cholesterol and blood lipids Have your blood tested for lipids and cholesterol at 49 years of age, then have this test every 5 years. Have your cholesterol levels checked more often if:  Your lipid or cholesterol levels are high.  You are older than 49 years of age.  You are at high risk for heart disease. What should I know about cancer screening? Depending on your health history and family history, you may need to have cancer screening at various ages. This may include screening for:  Breast cancer.  Cervical cancer.  Colorectal cancer.  Skin cancer.  Lung cancer. What should I know about heart disease, diabetes, and high blood  pressure? Blood pressure and heart disease  High blood pressure causes heart disease and increases the risk of stroke. This is more likely to develop in people who have high blood pressure readings, are of African descent, or are overweight.  Have your blood pressure checked: ? Every 3-5 years if you are 49-62 years of age. ? Every year if you are 49 years old or older. Diabetes Have regular diabetes screenings. This checks your fasting blood sugar level. Have the screening done:  Once every three years after age 49 if you are at a normal weight and have a low risk for diabetes.  More often and at a younger age if you are overweight or have a high risk for diabetes. What should I know about preventing infection? Hepatitis B If you have a higher risk for hepatitis B, you should be screened for this virus. Talk with your health care provider to find out if you are at risk for hepatitis B infection. Hepatitis C Testing is recommended for:  Everyone born from [birth year] through 1966.  Anyone with known risk factors for hepatitis C. Sexually transmitted infections (STIs)  Get screened for STIs, including gonorrhea and chlamydia, if: ? You are sexually active and are younger than 49 years of age. ? You are older than 49 years of age and your health care provider tells you that you are at risk for this type of infection. ? Your sexual activity has changed since you were last screened, and you are at increased risk for chlamydia or gonorrhea. Ask your health care provider if  you are at risk.  Ask your health care provider about whether you are at high risk for HIV. Your health care provider may recommend a prescription medicine to help prevent HIV infection. If you choose to take medicine to prevent HIV, you should first get tested for HIV. You should then be tested every 3 months for as long as you are taking the medicine. Pregnancy  If you are about to stop having your period (premenopausal) and  you may become pregnant, seek counseling before you get pregnant.  Take 400 to 800 micrograms (mcg) of folic acid every day if you become pregnant.  Ask for birth control (contraception) if you want to prevent pregnancy. Osteoporosis and menopause Osteoporosis is a disease in which the bones lose minerals and strength with aging. This can result in bone fractures. If you are 49 years old or older, or if you are at risk for osteoporosis and fractures, ask your health care provider if you should:  Be screened for bone loss.  Take a calcium or vitamin D supplement to lower your risk of fractures.  Be given hormone replacement therapy (HRT) to treat symptoms of menopause. Follow these instructions at home: Lifestyle  Do not use any products that contain nicotine or tobacco, such as cigarettes, e-cigarettes, and chewing tobacco. If you need help quitting, ask your health care provider.  Do not use street drugs.  Do not share needles.  Ask your health care provider for help if you need support or information about quitting drugs. Alcohol use  Do not drink alcohol if: ? Your health care provider tells you not to drink. ? You are pregnant, may be pregnant, or are planning to become pregnant.  If you drink alcohol: ? Limit how much you use to 0-1 drink a day. ? Limit intake if you are breastfeeding.  Be aware of how much alcohol is in your drink. In the [place] one drink equals one 12 oz bottle of beer (355 mL), one 5 oz glass of wine (148 mL), or one 1 oz glass of hard liquor (44 mL). General instructions  Schedule regular health, dental, and eye exams.  Stay current with your vaccines.  Tell your health care provider if: ? You often feel depressed. ? You have ever been abused or do not feel safe at home. Summary  Adopting a healthy lifestyle and getting preventive care are important in promoting health and wellness.  Follow your health care provider's instructions about healthy  diet, exercising, and getting tested or screened for diseases.  Follow your health care provider's instructions on monitoring your cholesterol and blood pressure. This information is not intended to replace advice given to you by your health care provider. Make sure you discuss any questions you have with your health care provider. Document Released: 08/20/2010 Document Revised: 01/28/2018 Document Reviewed: 01/28/2018 Elsevier Patient Education  2020 [employer] Inc[employer]  Preventive Care 17-23 Years Old, Female Preventive care refers to visits with your health care provider and lifestyle choices that can promote health and wellness. This includes:  A yearly physical exam. This may also be called an annual well check.  Regular dental visits and eye exams.  Immunizations.  Screening for certain conditions.  Healthy lifestyle choices, such as eating a healthy diet, getting regular exercise, not using drugs or products that contain nicotine and tobacco, and limiting alcohol use. What can I expect for my preventive care visit? Physical exam Your health care provider will check your:  Height and weight. This  may be used to calculate body mass index (BMI), which tells if you are at a healthy weight.  Heart rate and blood pressure.  Skin for abnormal spots. Counseling Your health care provider may ask you questions about your:  Alcohol, tobacco, and drug use.  Emotional well-being.  Home and relationship well-being.  Sexual activity.  Eating habits.  Work and work Statistician.  Method of birth control.  Menstrual cycle.  Pregnancy history. What immunizations do I need?  Influenza (flu) vaccine  This is recommended every year. Tetanus, diphtheria, and pertussis (Tdap) vaccine  You may need a Td booster every 10 years. Varicella (chickenpox) vaccine  You may need this if you have not been vaccinated. Zoster (shingles) vaccine  You may need this after age 46. Measles,  mumps, and rubella (MMR) vaccine  You may need at least one dose of MMR if you were born in 1957 or later. You may also need a second dose. Pneumococcal conjugate (PCV13) vaccine  You may need this if you have certain conditions and were not previously vaccinated. Pneumococcal polysaccharide (PPSV23) vaccine  You may need one or two doses if you smoke cigarettes or if you have certain conditions. Meningococcal conjugate (MenACWY) vaccine  You may need this if you have certain conditions. Hepatitis A vaccine  You may need this if you have certain conditions or if you travel or work in places where you may be exposed to hepatitis A. Hepatitis B vaccine  You may need this if you have certain conditions or if you travel or work in places where you may be exposed to hepatitis B. Haemophilus influenzae type b (Hib) vaccine  You may need this if you have certain conditions. Human papillomavirus (HPV) vaccine  If recommended by your health care provider, you may need three doses over 6 months. You may receive vaccines as individual doses or as more than one vaccine together in one shot (combination vaccines). Talk with your health care provider about the risks and benefits of combination vaccines. What tests do I need? Blood tests  Lipid and cholesterol levels. These may be checked every 5 years, or more frequently if you are over 17 years old.  Hepatitis C test.  Hepatitis B test. Screening  Lung cancer screening. You may have this screening every year starting at age 51 if you have a 30-pack-year history of smoking and currently smoke or have quit within the past 15 years.  Colorectal cancer screening. All adults should have this screening starting at age 20 and continuing until age 43. Your health care provider may recommend screening at age 39 if you are at increased risk. You will have tests every 1-10 years, depending on your results and the type of screening test.  Diabetes  screening. This is done by checking your blood sugar (glucose) after you have not eaten for a while (fasting). You may have this done every 1-3 years.  Mammogram. This may be done every 1-2 years. Talk with your health care provider about when you should start having regular mammograms. This may depend on whether you have a family history of breast cancer.  BRCA-related cancer screening. This may be done if you have a family history of breast, ovarian, tubal, or peritoneal cancers.  Pelvic exam and Pap test. This may be done every 3 years starting at age 68. Starting at age 44, this may be done every 5 years if you have a Pap test in combination with an HPV test. Other tests  Sexually transmitted disease (STD) testing.  Bone density scan. This is done to screen for osteoporosis. You may have this scan if you are at high risk for osteoporosis. Follow these instructions at home: Eating and drinking  Eat a diet that includes fresh fruits and vegetables, whole grains, lean protein, and low-fat dairy.  Take vitamin and mineral supplements as recommended by your health care provider.  Do not drink alcohol if: ? Your health care provider tells you not to drink. ? You are pregnant, may be pregnant, or are planning to become pregnant.  If you drink alcohol: ? Limit how much you have to 0-1 drink a day. ? Be aware of how much alcohol is in your drink. In the [place] one drink equals one 12 oz bottle of beer (355 mL), one 5 oz glass of wine (148 mL), or one 1 oz glass of hard liquor (44 mL). Lifestyle  Take daily care of your teeth and gums.  Stay active. Exercise for at least 30 minutes on 5 or more days each week.  Do not use any products that contain nicotine or tobacco, such as cigarettes, e-cigarettes, and chewing tobacco. If you need help quitting, ask your health care provider.  If you are sexually active, practice safe sex. Use a condom or other form of birth control (contraception) in  order to prevent pregnancy and STIs (sexually transmitted infections).  If told by your health care provider, take low-dose aspirin daily starting at age 26. What's next?  Visit your health care provider once a year for a well check visit.  Ask your health care provider how often you should have your eyes and teeth checked.  Stay up to date on all vaccines. This information is not intended to replace advice given to you by your health care provider. Make sure you discuss any questions you have with your health care provider. Document Released: 03/03/2015 Document Revised: 10/16/2017 Document Reviewed: 10/16/2017 Elsevier Patient Education  2020 [employer].

## 2018-10-08 NOTE — Progress Notes (Addendum)
Established Patient Office Visit  Subjective:  Patient ID: [name], female    DOB: [birth year]  Age: 48 y.o. MRN: [id number]  CC:  Chief Complaint  Patient presents with  . Annual Exam    HPI [name] presents for establishment of care by way of transfer and for a complete physical.  She has a significant past medical history hypertension and elevated cholesterol that are both well controlled with lisinopril and amlodipine and atorvastatin..  She is having no issues taking these medications.  She sometimes experiences left hip pain that she feels as though may be associated with the atorvastatin but she is also had issues with this hip pain in the past.  Chart review shows a mildly elevated hemoglobin A1c back in February of this year.  She has since been able lose 15 pounds.  She stays active working around her house but has no regular exercise program.  She is married and lives with her husband.  They have 3 children.  Her mother is in good health and her father is currently in recorvery.  She sees GYN for female health care.  She is being followed by ophthalmology for cataracts.  Was unable to see the dentist this year because of COVID.  She works as a Magazine features editor.  Past Medical History:  Diagnosis Date  . Abnormal Pap smear 2000   [facility]  . Allergy   . Back pain   . Breast fibroadenoma   . Diverticulitis of colon with perforation 05/20/2014  . GERD (gastroesophageal reflux disease)   . H/O metrorrhagia 12/2006  . H/O myomectomy 07-2006   robotic  . H/O sinusitis   . Headache(784.0)   . History of conization of cervix 2000  . History of ectopic pregnancy 2005   r salpyngectomy  . History of hysterectomy, supracervical 08-2008   robotic  . History of syphilis   . History of syphilis   . Hx of adenomatous polyp of colon 12/31/2017  . Hx of herpes simplex type 2 infection   . Hyperlipidemia   . Hypertension   . Ileostomy present ([place])  10/26/2014  . Intra-abdominal abscess ([facility]) 07/21/2014  . PMS (premenstrual syndrome)     Past Surgical History:  Procedure Laterality Date  . ABDOMINAL HYSTERECTOMY    . BREAST LUMPECTOMY  10/11   rt-neg  . BREAST SURGERY  2004   fibroademoma  . CERVICAL CONE BIOPSY  2000  . COLON RESECTION N/A 07/06/2014   Procedure: LAPAROSCOPIC LOOP ILEOSTOMY WITH PLACEMENT OF PELVIC DRAIN;  Surgeon: Donnie Mesa, MD;  Location: [facility];  Service: General;  Laterality: N/A;  . COLON SURGERY    . DILATION AND CURETTAGE OF UTERUS  2009  . DIVERTING ILEOSTOMY Right 06/2014  . ECTOPIC PREGNANCY SURGERY  2005   R salpyngectomy  . ILEOSTOMY N/A 10/26/2014   Procedure: LOOP ILEOSTOMY REVERSAL;  Surgeon: Donnie Mesa, MD;  Location: [facility];  Service: General;  Laterality: N/A;  . LAPAROSCOPIC SIGMOID COLECTOMY N/A 06/30/2014   Procedure: LAPAROSCOPIC ASSISTED SIGMOID COLECTOMY;  Surgeon: Donnie Mesa, MD;  Location: [facility];  Service: General;  Laterality: N/A;  . MYOMECTOMY  2008   robotic  . POLYPECTOMY  2009  . ROBOTIC ASSISTED LAP VAGINAL HYSTERECTOMY  2010   supracervical  . SALPINGECTOMY  2005  . TRIGGER FINGER RELEASE Right 09/28/2012   Procedure: RELEASE TRIGGER FINGER/A-1 PULLEY RIGHT THUMB;  Surgeon: Jolyn Nap, MD;  Location: [facility];  Service:  Orthopedics;  Laterality: Right;    Family History  Problem Relation Age of Onset  . Hypertension Paternal Grandmother   . Diabetes Paternal Grandmother   . Hypertension Maternal Grandmother   . Sarcoidosis Mother   . Hypertension Mother   . Hyperlipidemia Mother   . Thyroid disease Maternal Aunt   . Mental illness Cousin   . Fibroids Sister     Social History   Socioeconomic History  . Marital status: Married    Spouse name: [name]  . Number of children: 2  . Years of education: Not on file  . Highest education level: Associate degree: occupational, Hotel manager, or vocational program  Occupational History  . Occupation:  CMA    Comment: Primary Care at [employer]  . Financial resource strain: Not on file  . Food insecurity    Worry: Not on file    Inability: Not on file  . Transportation needs    Medical: Not on file    Non-medical: Not on file  Tobacco Use  . Smoking status: Former Smoker    Packs/day: 1.00    Years: 20.00    Pack years: 20.00    Types: Cigarettes    Quit date: 06/13/2011    Years since quitting: 7.3  . Smokeless tobacco: Never Used  Substance and Sexual Activity  . Alcohol use: Not Currently  . Drug use: No  . Sexual activity: Yes  Lifestyle  . Physical activity    Days per week: Not on file    Minutes per session: Not on file  . Stress: Not on file  Relationships  . Social Herbalist on phone: Not on file    Gets together: Not on file    Attends religious service: Not on file    Active member of club or organization: Not on file    Attends meetings of clubs or organizations: Not on file    Relationship status: Not on file  . Intimate partner violence    Fear of current or ex partner: Not on file    Emotionally abused: Not on file    Physically abused: Not on file    Forced sexual activity: Not on file  Other Topics Concern  . Not on file  Social History Narrative   Lives with her husband and their son.    Outpatient Medications Prior to Visit  Medication Sig Dispense Refill  . atorvastatin (LIPITOR) 20 MG tablet TAKE 1 TABLET BY MOUTH DAILY. 90 tablet 0  . Black Cohosh (REMIFEMIN) 20 MG TABS Take 40 mg by mouth daily.    Marland Kitchen ibuprofen (ADVIL,MOTRIN) 800 MG tablet Take 1 tablet (800 mg total) by mouth every 8 (eight) hours as needed. 30 tablet 0  . lisinopril (PRINIVIL,ZESTRIL) 10 MG tablet Take 1 tablet (10 mg total) by mouth daily. 90 tablet 1  . Multiple Vitamins-Minerals (HAIR/SKIN/NAILS/BIOTIN PO) Take 1 tablet by mouth daily.    Marland Kitchen amLODipine (NORVASC) 5 MG tablet Take 1 tablet (5 mg total) by mouth daily. 90 tablet 3  . nitroGLYCERIN  (NITROSTAT) 0.4 MG SL tablet Place 1 tablet (0.4 mg total) under the tongue every 5 (five) minutes as needed for up to 25 days for chest pain. 25 tablet 1  . benzonatate (TESSALON) 100 MG capsule Take 1-2 capsules (100-200 mg total) by mouth 3 (three) times daily as needed for cough. (Patient not taking: Reported on 04/24/2018) 40 capsule 0  . dextromethorphan 15 MG/5ML syrup Take 10 mLs (  30 mg total) by mouth 4 (four) times daily as needed for cough. (Patient not taking: Reported on 04/24/2018) 120 mL 0  . diclofenac sodium (VOLTAREN) 1 % GEL Apply 2 g topically 4 (four) times daily. (Patient not taking: Reported on 09/01/2018)    . dicyclomine (BENTYL) 20 MG tablet Take 1 tablet (20 mg total) by mouth 2 (two) times daily. (Patient not taking: Reported on 09/01/2018) 20 tablet 0  . fluticasone (FLONASE) 50 MCG/ACT nasal spray Place 1 spray into both nostrils 2 (two) times daily. (Patient not taking: Reported on 04/24/2018) 16 g 6  . guaiFENesin-codeine 100-10 MG/5ML syrup Take 10 mLs by mouth 3 (three) times daily as needed for cough. (Patient not taking: Reported on 04/24/2018) 200 mL 0  . ondansetron (ZOFRAN ODT) 4 MG disintegrating tablet Take 1 tablet (4 mg total) by mouth every 8 (eight) hours as needed for nausea or vomiting. 20 tablet 0   No facility-administered medications prior to visit.     Allergies  Allergen Reactions  . Keflex [Cephalexin] Anaphylaxis  . Penicillins Anaphylaxis    Has patient had a PCN reaction causing immediate rash, facial/tongue/throat swelling, SOB or lightheadedness with hypotension: Yes Has patient had a PCN reaction causing severe rash involving mucus membranes or skin necrosis: No Has patient had a PCN reaction that required hospitalization: No Has patient had a PCN reaction occurring within the last 10 years: No If all of the above answers are "NO", then may proceed with Cephalosporin use.     ROS Review of Systems  Constitutional: Negative.   HENT: Negative.    Eyes: Negative for photophobia and visual disturbance.  Respiratory: Negative.   Cardiovascular: Negative.   Gastrointestinal: Negative.   Endocrine: Negative for polyphagia and polyuria.  Genitourinary: Negative for frequency.  Musculoskeletal: Positive for arthralgias.  Skin: Negative for pallor and rash.  Allergic/Immunologic: Negative for immunocompromised state.  Neurological: Negative for tremors, facial asymmetry and speech difficulty.  Hematological: Does not bruise/bleed easily.  Psychiatric/Behavioral: Negative.       Objective:    Physical Exam  Constitutional: She is oriented to person, place, and time. She appears well-developed and well-nourished. No distress.  HENT:  Head: Normocephalic and atraumatic.  Right Ear: External ear normal.  Left Ear: External ear normal.  Mouth/Throat: Oropharynx is clear and moist. No oropharyngeal exudate.  Eyes: Pupils are equal, round, and reactive to light. Conjunctivae are normal. Right eye exhibits no discharge. Left eye exhibits no discharge. No scleral icterus.  Neck: Neck supple. No JVD present. No tracheal deviation present. No thyromegaly present.  Cardiovascular: Normal rate, regular rhythm and normal heart sounds.  Pulmonary/Chest: Effort normal and breath sounds normal. No stridor.  Abdominal: Bowel sounds are normal.  Musculoskeletal:        General: No edema.  Lymphadenopathy:    She has no cervical adenopathy.  Neurological: She is alert and oriented to person, place, and time.  Skin: Skin is warm and dry. She is not diaphoretic.  Psychiatric: She has a normal mood and affect. Her behavior is normal.    BP 118/70   Pulse 92   Temp (!) 96.2 F (35.7 C) (Temporal)   Ht 5\' 4"  (1.626 m)   Wt 211 lb 6 oz (95.9 kg)   SpO2 97%   BMI 36.28 kg/m  Wt Readings from Last 3 Encounters:  10/08/18 211 lb 6 oz (95.9 kg)  09/01/18 214 lb (97.1 kg)  04/25/18 216 lb (98 kg)   BP Readings from  Last 3 Encounters:   10/08/18 118/70  09/01/18 128/73  09/01/18 110/70   Guideline developer:  UpToDate (see UpToDate for funding source) Date Released: June 2014  There are no preventive care reminders to display for this patient.  There are no preventive care reminders to display for this patient.  Lab Results  Component Value Date   TSH 2.843 03/28/2018   Lab Results  Component Value Date   WBC 6.2 09/01/2018   HGB 13.3 09/01/2018   HCT 42.5 09/01/2018   MCV 94.7 09/01/2018   PLT 344 09/01/2018   Lab Results  Component Value Date   NA 139 09/01/2018   K 3.8 09/01/2018   CO2 25 09/01/2018   GLUCOSE 100 (H) 09/01/2018   BUN 11 09/01/2018   CREATININE 0.91 09/01/2018   BILITOT 0.9 09/01/2018   ALKPHOS 61 09/01/2018   AST 25 09/01/2018   ALT 24 09/01/2018   PROT 8.0 09/01/2018   ALBUMIN 4.3 09/01/2018   CALCIUM 9.9 09/01/2018   ANIONGAP 10 09/01/2018   Lab Results  Component Value Date   CHOL 162 03/28/2018   Lab Results  Component Value Date   HDL 59 03/28/2018   Lab Results  Component Value Date   LDLCALC 82 03/28/2018   Lab Results  Component Value Date   TRIG 106 03/28/2018   Lab Results  Component Value Date   CHOLHDL 2.7 03/28/2018   Lab Results  Component Value Date   HGBA1C 5.9 (H) 03/28/2018      Assessment & Plan:   Problem List Items Addressed This Visit      Cardiovascular and Mediastinum   Essential hypertension   Relevant Orders   Comprehensive metabolic panel   Urinalysis, Routine w reflex microscopic     Other   Elevated glucose   Relevant Orders   Comprehensive metabolic panel   Hemoglobin A1c   Elevated LDL cholesterol level   Relevant Orders   Lipid panel   Healthcare maintenance - Primary   Relevant Orders   CBC   Comprehensive metabolic panel   Need for immunization against influenza   Relevant Orders   Flu Vaccine QUAD 36+ mos IM (Completed)      No orders of the defined types were placed in this encounter.   Follow-up:  Return in about 6 months (around 04/10/2019).   Patient was given information on health maintenance and disease prevention.  I encouraged her to continue her weight loss journey.  She will follow-up with a dentist as soon as she is able.

## 2018-10-10 ENCOUNTER — Other Ambulatory Visit: Payer: Self-pay | Admitting: Family Medicine

## 2018-10-12 NOTE — Telephone Encounter (Signed)
Per pt, refills have been sent to new pcp.

## 2018-10-13 ENCOUNTER — Encounter: Payer: Self-pay | Admitting: Family Medicine

## 2018-10-14 DIAGNOSIS — M79641 Pain in right hand: Secondary | ICD-10-CM | POA: Diagnosis not present

## 2018-10-14 MED ORDER — ATORVASTATIN CALCIUM 20 MG PO TABS: 20.0000 mg | ORAL_TABLET | Freq: Every day | ORAL | 2 refills | Status: DC

## 2018-10-19 DIAGNOSIS — R200 Anesthesia of skin: Secondary | ICD-10-CM | POA: Diagnosis not present

## 2018-10-19 DIAGNOSIS — G5621 Lesion of ulnar nerve, right upper limb: Secondary | ICD-10-CM | POA: Insufficient documentation

## 2018-10-19 DIAGNOSIS — G5622 Lesion of ulnar nerve, left upper limb: Secondary | ICD-10-CM | POA: Insufficient documentation

## 2018-10-19 MED FILL — METHYLPREDNISOLONE 4 MG TAB: 4 | 6 days supply | Qty: 21 | Fill #0

## 2018-10-19 MED FILL — ATORVASTATIN 20 MG TABLET: 20 | 90 days supply | Qty: 90 | Fill #0

## 2018-10-19 MED FILL — CELECOXIB 200 MG CAPSULE: 200 | 30 days supply | Qty: 30 | Fill #0

## 2018-11-02 ENCOUNTER — Ambulatory Visit: Payer: 59 | Admitting: Cardiology

## 2018-11-05 DIAGNOSIS — Z8719 Personal history of other diseases of the digestive system: Secondary | ICD-10-CM | POA: Insufficient documentation

## 2018-11-05 DIAGNOSIS — M629 Disorder of muscle, unspecified: Secondary | ICD-10-CM | POA: Diagnosis not present

## 2018-11-05 DIAGNOSIS — Z6836 Body mass index (BMI) 36.0-36.9, adult: Secondary | ICD-10-CM | POA: Diagnosis not present

## 2018-11-05 DIAGNOSIS — Z1239 Encounter for other screening for malignant neoplasm of breast: Secondary | ICD-10-CM | POA: Diagnosis not present

## 2018-11-05 DIAGNOSIS — N3946 Mixed incontinence: Secondary | ICD-10-CM | POA: Diagnosis not present

## 2018-11-05 DIAGNOSIS — Z01419 Encounter for gynecological examination (general) (routine) without abnormal findings: Secondary | ICD-10-CM | POA: Diagnosis not present

## 2018-11-05 DIAGNOSIS — Z1211 Encounter for screening for malignant neoplasm of colon: Secondary | ICD-10-CM | POA: Diagnosis not present

## 2018-11-12 ENCOUNTER — Ambulatory Visit: Payer: 59

## 2018-11-16 DIAGNOSIS — G5621 Lesion of ulnar nerve, right upper limb: Secondary | ICD-10-CM | POA: Diagnosis not present

## 2018-11-17 ENCOUNTER — Other Ambulatory Visit: Payer: Self-pay | Admitting: Orthopedic Surgery

## 2018-11-19 ENCOUNTER — Encounter: Payer: Self-pay | Admitting: Cardiology

## 2018-11-19 ENCOUNTER — Other Ambulatory Visit: Payer: Self-pay

## 2018-11-19 ENCOUNTER — Ambulatory Visit (INDEPENDENT_AMBULATORY_CARE_PROVIDER_SITE_OTHER): Payer: 59 | Admitting: Cardiology

## 2018-11-19 VITALS — BP 130/80 | HR 89 | Ht 64.0 in | Wt 213.0 lb

## 2018-11-19 DIAGNOSIS — G5602 Carpal tunnel syndrome, left upper limb: Secondary | ICD-10-CM | POA: Diagnosis not present

## 2018-11-19 DIAGNOSIS — I10 Essential (primary) hypertension: Secondary | ICD-10-CM | POA: Diagnosis not present

## 2018-11-19 DIAGNOSIS — E7800 Pure hypercholesterolemia, unspecified: Secondary | ICD-10-CM

## 2018-11-19 NOTE — Progress Notes (Signed)
Subjective:   @Patient  ID: [name], female    DOB: 1970-07-01, 48 y.o.   MRN: [id number]  Chief Complaint  Patient presents with   Hypertension   Follow-up   HPI   [name] is a 47 year old female with a medical history significant for hypertension, hyperlipidemia, history of 10 pack year cigarette smoking, quit in 2013 and diverticulosis status post colectomy who presents to the the clinic for hypertensin and chest pain follow up.  She was last seen by me in March 2020.  I had started her on amlodipine and states that since then her blood pressure has been very well controlled and she has not had any further episodes of chest pain.   Past Medical History:  Diagnosis Date   Abnormal Pap smear 2000   [facility] Biopsy   Allergy    Back pain    Breast fibroadenoma    Diverticulitis of colon with perforation 05/20/2014   GERD (gastroesophageal reflux disease)    H/O metrorrhagia 12/2006   H/O myomectomy 07-2006   robotic   H/O sinusitis    Headache(784.0)    History of conization of cervix 2000   History of ectopic pregnancy 2005   r salpyngectomy   History of hysterectomy, supracervical 08-2008   robotic   History of syphilis    History of syphilis    Hx of adenomatous polyp of colon 12/31/2017   Hx of herpes simplex type 2 infection    Hyperlipidemia    Hypertension    Ileostomy present ([place]) 10/26/2014   Intra-abdominal abscess ([place]) 07/21/2014   PMS (premenstrual syndrome)     Past Surgical History:  Procedure Laterality Date   ABDOMINAL HYSTERECTOMY     BREAST LUMPECTOMY  10/11   rt-neg   BREAST SURGERY  2004   fibroademoma   CERVICAL CONE BIOPSY  2000   COLON RESECTION N/A 07/06/2014   Procedure: LAPAROSCOPIC LOOP ILEOSTOMY WITH PLACEMENT OF PELVIC DRAIN;  Surgeon: Donnie Mesa, MD;  Location: [facility];  Service: General;  Laterality: N/A;   COLON SURGERY     DILATION AND CURETTAGE OF UTERUS  2009   DIVERTING ILEOSTOMY  Right 06/2014   ECTOPIC PREGNANCY SURGERY  2005   R salpyngectomy   ILEOSTOMY N/A 10/26/2014   Procedure: LOOP ILEOSTOMY REVERSAL;  Surgeon: Donnie Mesa, MD;  Location: [facility];  Service: General;  Laterality: N/A;   LAPAROSCOPIC SIGMOID COLECTOMY N/A 06/30/2014   Procedure: LAPAROSCOPIC ASSISTED SIGMOID COLECTOMY;  Surgeon: Donnie Mesa, MD;  Location: [facility];  Service: General;  Laterality: N/A;   MYOMECTOMY  2008   robotic   POLYPECTOMY  2009   ROBOTIC ASSISTED LAP VAGINAL HYSTERECTOMY  2010   supracervical   SALPINGECTOMY  2005   TRIGGER FINGER RELEASE Right 09/28/2012   Procedure: RELEASE TRIGGER FINGER/A-1 PULLEY RIGHT THUMB;  Surgeon: Jolyn Nap, MD;  Location: [facility];  Service: Orthopedics;  Laterality: Right;    Social History   Socioeconomic History   Marital status: Married    Spouse name: [name]   Number of children: 2   Years of education: Not on file   Highest education level: Associate degree: occupational, Hotel manager, or vocational program  Occupational History   Occupation: CMA    Comment: Primary Care at [employer] resource strain: Not on file   Food insecurity    Worry: Not on file    Inability: Not on file   Transportation needs  Medical: Not on file    Non-medical: Not on file  Tobacco Use   Smoking status: Former Smoker    Packs/day: 1.00    Years: 20.00    Pack years: 20.00    Types: Cigarettes    Quit date: 06/13/2011    Years since quitting: 7.4   Smokeless tobacco: Never Used  Substance and Sexual Activity   Alcohol use: Not Currently   Drug use: No   Sexual activity: Yes  Lifestyle   Physical activity    Days per week: Not on file    Minutes per session: Not on file   Stress: Not on file  Relationships   Social connections    Talks on phone: Not on file    Gets together: Not on file    Attends religious service: Not on file    Active member of club or organization:  Not on file    Attends meetings of clubs or organizations: Not on file    Relationship status: Not on file   Intimate partner violence    Fear of current or ex partner: Not on file    Emotionally abused: Not on file    Physically abused: Not on file    Forced sexual activity: Not on file  Other Topics Concern   Not on file  Social History Narrative   Lives with her husband and their son.    Current Outpatient Medications on File Prior to Visit  Medication Sig Dispense Refill   amLODipine (NORVASC) 5 MG tablet Take 1 tablet (5 mg total) by mouth daily. 90 tablet 3   atorvastatin (LIPITOR) 20 MG tablet Take 1 tablet (20 mg total) by mouth daily. 90 tablet 2   Black Cohosh (REMIFEMIN) 20 MG TABS Take 40 mg by mouth daily.     ibuprofen (ADVIL,MOTRIN) 800 MG tablet Take 1 tablet (800 mg total) by mouth every 8 (eight) hours as needed. 30 tablet 0   lisinopril (PRINIVIL,ZESTRIL) 10 MG tablet Take 1 tablet (10 mg total) by mouth daily. 90 tablet 1   Multiple Vitamins-Minerals (HAIR/SKIN/NAILS/BIOTIN PO) Take 1 tablet by mouth daily.     nitroGLYCERIN (NITROSTAT) 0.4 MG SL tablet Place 1 tablet (0.4 mg total) under the tongue every 5 (five) minutes as needed for up to 25 days for chest pain. 25 tablet 1   No current facility-administered medications on file prior to visit.     DG Chest 2 View 04/21/2018: No active cardiopulmonary disease.  Echocardiogram 04/15/2018: Left ventricle cavity is normal in size. Moderate concentric hypertrophy of the left ventricle. Normal global wall motion. Doppler evidence of grade I (impaired) diastolic dysfunction, normal LAP. Calculated EF 62%. Inadequate TR jet to estimate pulmonary artery systolic pressure. Normal right atrial pressure.  Exercise sestamibi stress test 04/08/2018:  1. The patient performed treadmill exercise using Bruce protocol, completing 5:00 minutes. The patient completed an estimated workload of 7 METS, reaching 89% of the  maximum predicted heart rate. Exercise capacity was low. Hemodynamic response was normal. Stress symptoms included fatigue.  2. The overall quality of the study is excellent. There is no evidence of abnormal lung activity. Stress and rest SPECT images demonstrate homogeneous tracer distribution throughout the myocardium. Gated SPECT imaging reveals normal myocardial thickening and wall motion. The left ventricular ejection fraction was normal (70%).   3. Low risk study.  Review of Systems  Constitutional: Negative for unexpected weight change.  HENT: Negative for congestion.   Eyes: Negative for visual disturbance.  Respiratory:  Positive for chest tightness.   Gastrointestinal: Negative for abdominal pain, nausea and vomiting.  Endocrine: Negative for cold intolerance.  Genitourinary: Negative for dysuria.  Musculoskeletal: Negative for myalgias.  Skin: Negative for rash.  Allergic/Immunologic: Negative for immunocompromised state.  Neurological: Negative for dizziness.  Hematological: Does not bruise/bleed easily.  Psychiatric/Behavioral: The patient is not nervous/anxious.   All other systems reviewed and are negative.  Vitals with BMI 11/19/2018 10/08/2018 09/01/2018  Height 5\' 4"  5\' 4"  -  Weight 213 lbs 211 lbs 6 oz -  BMI [id number] 0000000 -  Systolic 123XX123 123456 0000000  Diastolic 91 70 73  Pulse 89 92 83     Physical Exam Vitals signs reviewed.  Constitutional:      General: She is not in acute distress.    Appearance: Normal appearance. She is obese.  HENT:     Head: Normocephalic.  Neck:     Musculoskeletal: Neck supple.     Thyroid: No thyromegaly.     Vascular: No carotid bruit.  Cardiovascular:     Rate and Rhythm: Normal rate and regular rhythm.     Pulses: Normal pulses.     Heart sounds: Normal heart sounds, S1 normal and S2 normal. No murmur. No friction rub. No gallop.   Pulmonary:     Effort: Pulmonary effort is normal. No respiratory distress.     Breath sounds: Normal  breath sounds. No wheezing, rhonchi or rales.  Abdominal:     General: Abdomen is protuberant. Bowel sounds are normal. There is no abdominal bruit.     Palpations: Abdomen is soft. There is no pulsatile mass.  Musculoskeletal:     Right lower leg: No edema.     Left lower leg: No edema.  Skin:    General: Skin is warm.  Neurological:     General: No focal deficit present.     Mental Status: She is alert.    Assessment & Recommendations:     ICD-10-CM   1. Essential hypertension  I10   2. Tachycardia  R00.0 EKG 12-Lead    EKG 11/19/2018: Normal sinus rhythm at rate of 87 bpm, normal axis.  Incomplete right bundle branch block.  Normal EKG.  No significant change from  EKG 03/23/2018  Lipid Panel     Component Value Date/Time   CHOL 180 10/08/2018 1002   CHOL 182 08/15/2017 1505   TRIG 70.0 10/08/2018 1002   HDL 64.10 10/08/2018 1002   HDL 66 08/15/2017 1505   CHOLHDL 3 10/08/2018 1002   VLDL 14.0 10/08/2018 1002   LDLCALC 102 (H) 10/08/2018 1002   LDLCALC 93 08/15/2017 1505    Hemoglobin A1C Latest Ref Range: 4.8 - 5.6 % 5.9 (H)   Recommendation:   Patient on a 72-month follow-up visit, has not had any recurrence of chest pain since being on amlodipine and blood pressure is also well controlled.  Continue the same.  With regard to hyperlipidemia, she is presently on high intensity statin with atorvastatin, LDL has slightly increased recently, weight loss was discussed and making dietary changes was discussed.  Would recommend continuing present dose.  Otherwise stable from cardiac standpoint, nuclear stress test and echocardiogram in February 2020 had revealed no evidence of ischemia and normal LVEF and moderate LVH.  I reviewed them again with the patient.  I will see her back on a as needed basis.   Adrian Prows, MD, [facility] 11/19/2018, 12:19 PM [facility] [place] Pager: [phone] Office: [phone] If no answer Cell [phone]

## 2018-11-24 ENCOUNTER — Other Ambulatory Visit: Payer: Self-pay

## 2018-11-24 ENCOUNTER — Encounter (HOSPITAL_BASED_OUTPATIENT_CLINIC_OR_DEPARTMENT_OTHER): Payer: Self-pay | Admitting: *Deleted

## 2018-11-26 ENCOUNTER — Encounter: Payer: Self-pay | Admitting: Gastroenterology

## 2018-11-27 ENCOUNTER — Other Ambulatory Visit: Payer: Self-pay

## 2018-11-27 ENCOUNTER — Other Ambulatory Visit (HOSPITAL_COMMUNITY)
Admission: RE | Admit: 2018-11-27 | Discharge: 2018-11-27 | Disposition: A | Discharge status: Home / Self Care | Payer: 59 | Source: Ambulatory Visit | Attending: Orthopedic Surgery | Admitting: Orthopedic Surgery

## 2018-11-27 DIAGNOSIS — R202 Paresthesia of skin: Secondary | ICD-10-CM

## 2018-11-27 DIAGNOSIS — Z20828 Contact with and (suspected) exposure to other viral communicable diseases: Secondary | ICD-10-CM | POA: Insufficient documentation

## 2018-11-27 DIAGNOSIS — Z01812 Encounter for preprocedural laboratory examination: Secondary | ICD-10-CM | POA: Diagnosis not present

## 2018-11-27 NOTE — Progress Notes (Signed)

## 2018-11-30 LAB — NOVEL CORONAVIRUS, NAA (HOSP ORDER, SEND-OUT TO REF LAB; TAT 18-24 HRS): SARS-CoV-2, NAA: NOT DETECTED

## 2018-12-01 ENCOUNTER — Other Ambulatory Visit: Payer: Self-pay

## 2018-12-01 ENCOUNTER — Encounter (HOSPITAL_BASED_OUTPATIENT_CLINIC_OR_DEPARTMENT_OTHER)
Admission: RE | Disposition: A | Discharge status: Home / Self Care | Payer: Self-pay | Source: Home / Self Care | Attending: Orthopedic Surgery

## 2018-12-01 ENCOUNTER — Ambulatory Visit (HOSPITAL_BASED_OUTPATIENT_CLINIC_OR_DEPARTMENT_OTHER)
Admission: RE | Admit: 2018-12-01 | Discharge: 2018-12-01 | Disposition: A | Discharge status: Home / Self Care | Payer: 59 | Attending: Orthopedic Surgery | Admitting: Orthopedic Surgery

## 2018-12-01 ENCOUNTER — Encounter (HOSPITAL_BASED_OUTPATIENT_CLINIC_OR_DEPARTMENT_OTHER): Payer: Self-pay

## 2018-12-01 ENCOUNTER — Ambulatory Visit (HOSPITAL_BASED_OUTPATIENT_CLINIC_OR_DEPARTMENT_OTHER): Payer: 59 | Admitting: Anesthesiology

## 2018-12-01 DIAGNOSIS — Z87891 Personal history of nicotine dependence: Secondary | ICD-10-CM | POA: Diagnosis not present

## 2018-12-01 DIAGNOSIS — I10 Essential (primary) hypertension: Secondary | ICD-10-CM | POA: Diagnosis not present

## 2018-12-01 DIAGNOSIS — R0789 Other chest pain: Secondary | ICD-10-CM | POA: Diagnosis not present

## 2018-12-01 DIAGNOSIS — K219 Gastro-esophageal reflux disease without esophagitis: Secondary | ICD-10-CM | POA: Diagnosis not present

## 2018-12-01 DIAGNOSIS — G5621 Lesion of ulnar nerve, right upper limb: Secondary | ICD-10-CM | POA: Insufficient documentation

## 2018-12-01 DIAGNOSIS — R200 Anesthesia of skin: Secondary | ICD-10-CM | POA: Diagnosis present

## 2018-12-01 DIAGNOSIS — E785 Hyperlipidemia, unspecified: Secondary | ICD-10-CM | POA: Diagnosis not present

## 2018-12-01 HISTORY — PX: ULNAR NERVE TRANSPOSITION: SHX2595

## 2018-12-01 SURGERY — ULNAR NERVE DECOMPRESSION/TRANSPOSITION
Anesthesia: Monitor Anesthesia Care | Site: Elbow | Laterality: Right

## 2018-12-01 MED ORDER — MIDAZOLAM HCL 2 MG/2ML IJ SOLN
INTRAMUSCULAR | Status: AC
  Filled 2018-12-01: qty 2

## 2018-12-01 MED ORDER — MIDAZOLAM HCL 2 MG/2ML IJ SOLN
1.0000 mg | INTRAMUSCULAR | Status: DC | PRN
  Administered 2018-12-01: 2 mg via INTRAVENOUS

## 2018-12-01 MED ORDER — FENTANYL CITRATE (PF) 100 MCG/2ML IJ SOLN: 25.0000 ug | INTRAMUSCULAR | Status: DC | PRN

## 2018-12-01 MED ORDER — ROPIVACAINE HCL 7.5 MG/ML IJ SOLN
INTRAMUSCULAR | Status: DC | PRN
  Administered 2018-12-01: 25 mL via PERINEURAL

## 2018-12-01 MED ORDER — PROPOFOL 10 MG/ML IV BOLUS
INTRAVENOUS | Status: DC | PRN
  Administered 2018-12-01 (×2): 20 mg via INTRAVENOUS

## 2018-12-01 MED ORDER — OXYCODONE HCL 5 MG PO TABS
5.0000 mg | ORAL_TABLET | Freq: Once | ORAL | Status: AC | PRN
  Administered 2018-12-01: 12:00:00 5 mg via ORAL

## 2018-12-01 MED ORDER — ONDANSETRON HCL 4 MG/2ML IJ SOLN
INTRAMUSCULAR | Status: DC | PRN
  Administered 2018-12-01: 4 mg via INTRAVENOUS

## 2018-12-01 MED ORDER — CLONIDINE HCL (ANALGESIA) 100 MCG/ML EP SOLN
EPIDURAL | Status: DC | PRN
  Administered 2018-12-01: 100 ug

## 2018-12-01 MED ORDER — HYDROCODONE-ACETAMINOPHEN 5-325 MG PO TABS: 1.0000 | ORAL_TABLET | Freq: Four times a day (QID) | ORAL | 0 refills | Status: DC | PRN

## 2018-12-01 MED ORDER — FENTANYL CITRATE (PF) 100 MCG/2ML IJ SOLN
50.0000 ug | INTRAMUSCULAR | Status: DC | PRN
  Administered 2018-12-01: 10:00:00 100 ug via INTRAVENOUS

## 2018-12-01 MED ORDER — OXYCODONE HCL 5 MG PO TABS
ORAL_TABLET | ORAL | Status: AC
  Filled 2018-12-01: qty 1

## 2018-12-01 MED ORDER — ONDANSETRON HCL 4 MG/2ML IJ SOLN: 4.0000 mg | Freq: Once | INTRAMUSCULAR | Status: DC | PRN

## 2018-12-01 MED ORDER — MEPERIDINE HCL 25 MG/ML IJ SOLN: 6.2500 mg | INTRAMUSCULAR | Status: DC | PRN

## 2018-12-01 MED ORDER — PROPOFOL 500 MG/50ML IV EMUL
INTRAVENOUS | Status: DC | PRN
  Administered 2018-12-01: 50 ug/kg/min via INTRAVENOUS

## 2018-12-01 MED ORDER — OXYCODONE HCL 5 MG/5ML PO SOLN: 5.0000 mg | Freq: Once | ORAL | Status: AC | PRN

## 2018-12-01 MED ORDER — VANCOMYCIN HCL IN DEXTROSE 1-5 GM/200ML-% IV SOLN
INTRAVENOUS | Status: AC
  Filled 2018-12-01: qty 200

## 2018-12-01 MED ORDER — FENTANYL CITRATE (PF) 100 MCG/2ML IJ SOLN
INTRAMUSCULAR | Status: AC
  Filled 2018-12-01: qty 2

## 2018-12-01 MED ORDER — VANCOMYCIN HCL IN DEXTROSE 1-5 GM/200ML-% IV SOLN
1000.0000 mg | INTRAVENOUS | Status: AC
  Administered 2018-12-01: 1000 mg via INTRAVENOUS

## 2018-12-01 MED ORDER — LACTATED RINGERS IV SOLN
INTRAVENOUS | Status: DC
  Administered 2018-12-01: 10:00:00 via INTRAVENOUS

## 2018-12-01 MED ORDER — CHLORHEXIDINE GLUCONATE 4 % EX LIQD: 60.0000 mL | Freq: Once | CUTANEOUS | Status: DC

## 2018-12-01 MED ORDER — ACETAMINOPHEN 160 MG/5ML PO SOLN: 325.0000 mg | ORAL | Status: DC | PRN

## 2018-12-01 MED ORDER — ACETAMINOPHEN 325 MG PO TABS: 325.0000 mg | ORAL_TABLET | ORAL | Status: DC | PRN

## 2018-12-01 MED FILL — HYDROCODON-APAP 5-325: 5-325 | 5 days supply | Qty: 20 | Fill #0

## 2018-12-01 SURGICAL SUPPLY — 51 items
APL PRP STRL LF DISP 70% ISPRP (MISCELLANEOUS) ×1
BLADE MINI RND TIP GREEN BEAV (BLADE) IMPLANT
BLADE SURG 15 STRL LF DISP TIS (BLADE) ×1 IMPLANT
BLADE SURG 15 STRL SS (BLADE) ×3
BNDG CMPR 9X4 STRL LF SNTH (GAUZE/BANDAGES/DRESSINGS) ×1
BNDG COHESIVE 3X5 TAN STRL LF (GAUZE/BANDAGES/DRESSINGS) ×6 IMPLANT
BNDG ESMARK 4X9 LF (GAUZE/BANDAGES/DRESSINGS) ×3 IMPLANT
BNDG GAUZE ELAST 4 BULKY (GAUZE/BANDAGES/DRESSINGS) ×3 IMPLANT
CHLORAPREP W/TINT 26 (MISCELLANEOUS) ×3 IMPLANT
CORD BIPOLAR FORCEPS 12FT (ELECTRODE) ×3 IMPLANT
COVER BACK TABLE REUSABLE LG (DRAPES) ×3 IMPLANT
COVER MAYO STAND REUSABLE (DRAPES) ×3 IMPLANT
COVER WAND RF STERILE (DRAPES) IMPLANT
CUFF TOURN SGL QUICK 18X3 (MISCELLANEOUS) ×3 IMPLANT
DECANTER SPIKE VIAL GLASS SM (MISCELLANEOUS) IMPLANT
DRAPE EXTREMITY T 121X128X90 (DISPOSABLE) ×3 IMPLANT
DRAPE SURG 17X23 STRL (DRAPES) ×3 IMPLANT
DRSG PAD ABDOMINAL 8X10 ST (GAUZE/BANDAGES/DRESSINGS) ×3 IMPLANT
GAUZE 4X4 16PLY RFD (DISPOSABLE) IMPLANT
GAUZE SPONGE 4X4 12PLY STRL (GAUZE/BANDAGES/DRESSINGS) ×3 IMPLANT
GAUZE XEROFORM 1X8 LF (GAUZE/BANDAGES/DRESSINGS) ×3 IMPLANT
GLOVE BIO SURGEON STRL SZ 6.5 (GLOVE) ×2 IMPLANT
GLOVE BIO SURGEONS STRL SZ 6.5 (GLOVE) ×1
GLOVE BIOGEL PI IND STRL 7.0 (GLOVE) ×2 IMPLANT
GLOVE BIOGEL PI IND STRL 8.5 (GLOVE) ×1 IMPLANT
GLOVE BIOGEL PI INDICATOR 7.0 (GLOVE) ×4
GLOVE BIOGEL PI INDICATOR 8.5 (GLOVE) ×2
GLOVE SURG ORTHO 8.0 STRL STRW (GLOVE) ×3 IMPLANT
GOWN STRL REUS W/ TWL LRG LVL3 (GOWN DISPOSABLE) ×1 IMPLANT
GOWN STRL REUS W/TWL LRG LVL3 (GOWN DISPOSABLE) ×3
GOWN STRL REUS W/TWL XL LVL3 (GOWN DISPOSABLE) ×3 IMPLANT
LOOP VESSEL MAXI BLUE (MISCELLANEOUS) IMPLANT
NEEDLE PRECISIONGLIDE 27X1.5 (NEEDLE) IMPLANT
NS IRRIG 1000ML POUR BTL (IV SOLUTION) ×3 IMPLANT
PACK BASIN DAY SURGERY FS (CUSTOM PROCEDURE TRAY) ×3 IMPLANT
PAD CAST 3X4 CTTN HI CHSV (CAST SUPPLIES) IMPLANT
PAD CAST 4YDX4 CTTN HI CHSV (CAST SUPPLIES) ×1 IMPLANT
PADDING CAST COTTON 3X4 STRL (CAST SUPPLIES)
PADDING CAST COTTON 4X4 STRL (CAST SUPPLIES) ×3
SLEEVE SCD COMPRESS KNEE MED (MISCELLANEOUS) ×3 IMPLANT
SPLINT PLASTER CAST XFAST 3X15 (CAST SUPPLIES) IMPLANT
SPLINT PLASTER XTRA FASTSET 3X (CAST SUPPLIES)
STOCKINETTE 4X48 STRL (DRAPES) ×3 IMPLANT
SUT ETHILON 4 0 PS 2 18 (SUTURE) ×3 IMPLANT
SUT VIC AB 2-0 SH 27 (SUTURE) ×3
SUT VIC AB 2-0 SH 27XBRD (SUTURE) ×1 IMPLANT
SUT VICRYL 4-0 PS2 18IN ABS (SUTURE) ×3 IMPLANT
SYR BULB 3OZ (MISCELLANEOUS) ×3 IMPLANT
SYR CONTROL 10ML LL (SYRINGE) IMPLANT
TOWEL GREEN STERILE FF (TOWEL DISPOSABLE) ×3 IMPLANT
UNDERPAD 30X36 HEAVY ABSORB (UNDERPADS AND DIAPERS) ×3 IMPLANT

## 2018-12-01 NOTE — Discharge Instructions (Addendum)

## 2018-12-01 NOTE — Anesthesia Procedure Notes (Signed)
Anesthesia Regional Block: Axillary brachial plexus block   Pre-Anesthetic Checklist: ,, timeout performed, Correct Patient, Correct Site, Correct Laterality, Correct Procedure, Correct Position, site marked, Risks and benefits discussed,  Surgical consent,  Pre-op evaluation,  At surgeon's request and post-op pain management  Laterality: Right  Prep: chloraprep       Needles:  Injection technique: Single-shot  Needle Type: Echogenic Stimulator Needle     Needle Length: 5cm  Needle Gauge: 22     Additional Needles:   Procedures:, nerve stimulator,,, ultrasound used (permanent image in chart),,,,  Narrative:  Start time: 12/01/2018 9:50 AM End time: 12/01/2018 9:56 AM Injection made incrementally with aspirations every 5 mL.  Performed by: Personally  Anesthesiologist: Janeece Riggers, MD  Additional Notes: Functioning IV was confirmed and monitors were applied.  A 77mm 22ga Arrow echogenic stimulator needle was used. Sterile prep and drape,hand hygiene and sterile gloves were used. Ultrasound guidance: relevant anatomy identified, needle position confirmed, local anesthetic spread visualized around nerve(s)., vascular puncture avoided.  Image printed for medical record. Negative aspiration and negative test dose prior to incremental administration of local anesthetic. The patient tolerated the procedure well.

## 2018-12-01 NOTE — Progress Notes (Signed)
Assisted Dr. Oddono with right, ultrasound guided, axillary block. Side rails up, monitors on throughout procedure. See vital signs in flow sheet. Tolerated Procedure well. 

## 2018-12-01 NOTE — Op Note (Signed)
NAME: [name] MEDICAL RECORD NO: [id number] DATE OF BIRTH: 02-06-1971 FACILITY: [facility] LOCATION: [facility] PHYSICIAN: Wynonia Sours, MD   OPERATIVE REPORT   DATE OF PROCEDURE: 12/01/18    PREOPERATIVE DIAGNOSIS:   Cubital tunnel syndrome right arm   POSTOPERATIVE DIAGNOSIS:   Same   PROCEDURE:   Decompression ulnar nerve right elbow   SURGEON: Daryll Brod, M.D.   ASSISTANT: none   ANESTHESIA:  Regional with sedation   INTRAVENOUS FLUIDS:  Per anesthesia flow sheet.   ESTIMATED BLOOD LOSS:  Minimal.   COMPLICATIONS:  None.   SPECIMENS:  none   TOURNIQUET TIME:    Total Tourniquet Time Documented: Upper Arm (Right) - 21 minutes Total: Upper Arm (Right) - 21 minutes    DISPOSITION:  Stable to PACU.   INDICATIONS: Patient is a 48 year old female with a history of numbness and tingling ring and small fingers right hand.  Nerve conductions reveal cubital tunnel syndrome.  This not responded to conservative treatment she is elected to undergo surgical decompression possible transposition ulnar nerve right elbow.  Preperi-and postoperative course been discussed along with risk complications.  She is aware that there is no guarantee to the surgery the possibility of infection recurrence injury to arteries nerves tendons incomplete relief of symptoms and dystrophy.  Preoperative area patient is seen the extremity marked by both patient and surgeon antibiotic given.  A axillary block was carried out without difficulty in the preop area under the direction the anesthesia department.  OPERATIVE COURSE: She is brought to the operating room placed in the supine position with the right arm free.  She was prepped and draped following prep with ChloraPrep and a 3-minute dry time allowed.  Timeout was taken to confirm patient procedure.  The limb was exsanguinated with an Esmarch bandage turn placed high in the arm was inflated to 250 mmHg.  A 4 to 5 cm incision was  then made on the posterior aspect  medial side of her elbow just posterior to the epicondyle.  This carried down through subcutaneous tissue.  Bleeders were electrocauterized with bipolar.  Self-retaining retractors were placed and a [employer] used to isolate Osborne's fascia.  This was identified.  The posterior branches of the medial antebrachial cutaneous nerve and the far forearm were not seen they were left forearm not seen in the incision.  The Osborne's fascia was then released on its most posterior aspect.  This allowed visualization of the ulnar nerve at the elbow.  The subcutaneous tissue and skin were then dissected free with blunt dissection from the flexor carpi ulnaris muscle belly.  The superficial fascia was split after placement of knee retractor.  The muscle belly was then split with blunt dissection.  A Kamiah guide for carpal tunnel release is done per placed between the ulnar nerve and the deep fascia.  The deep fascia was then released for approximately 7 to 8 cm distally using angled ENT scissors.  Dissection was then carried proximally.  The brachial fascia was then isolated from the overlying skin and subcutaneous tissue.  The knee retractor was placed proximally.  The [facility] guide for carpal tunnel release was then placed between the ulnar nerve and the brachial fascia and using the ENT scissors the fascia was released for approximately 7 to 8 cm proximally.  The nerve was visualized over its entire course found to be inspected intact without any areas of compression.  The elbow was fully flexed no subluxation was noted.  The  wound was copiously irrigated with saline.  Osborne's fascia was then sutured to the posterior subcutaneous tissue with 2-0 Vicryl sutures.  The subcutaneous tissue was closed with interrupted 4-0 Vicryl and the skin with interrupted 4-0 nylon sutures.  A sterile compressive dressing was applied deflation of the tourniquet all fingers immediately pink.  She was taken  to the recovery room for observation in satisfactory condition.  She will be discharged home with return to the [facility] in 1 week on Tylenol ibuprofen with Norco for backup.   Daryll Brod, MD Electronically signed, 12/01/18

## 2018-12-01 NOTE — Transfer of Care (Signed)
Immediate Anesthesia Transfer of Care Note  Patient: [name]  Procedure(s) Performed: ULNAR NERVE DECOMPRESSION (Right Elbow)  Patient Location: PACU  Anesthesia Type:MAC combined with regional for post-op pain  Level of Consciousness: sedated  Airway & Oxygen Therapy: Patient Spontanous Breathing and Patient connected to face mask oxygen  Post-op Assessment: Report given to RN and Post -op Vital signs reviewed and stable  Post vital signs: Reviewed and stable  Last Vitals:  Vitals Value Taken Time  BP    Temp    Pulse    Resp    SpO2      Last Pain:  Vitals:   12/01/18 0926  TempSrc: Oral  PainSc: 4       Patients Stated Pain Goal: 3 (24/17/53 0104)  Complications: No apparent anesthesia complications

## 2018-12-01 NOTE — Brief Op Note (Cosign Needed)
12/01/2018  11:13 AM  PATIENT:  [name]  48 y.o. female  PRE-OPERATIVE DIAGNOSIS:  CUBITAL TUNNEL RIGHT ARM  POST-OPERATIVE DIAGNOSIS:  CUBITAL TUNNEL RIGHT ARM  PROCEDURE:  Procedure(s) with comments: ULNAR NERVE DECOMPRESSION (Right) - AXILLARY  SURGEON:  Surgeon(s) and Role:    * Daryll Brod, MD - Primary  PHYSICIAN ASSISTANT:   ASSISTANTS: none   ANESTHESIA:   regional and IV sedation  EBL: 64ml  BLOOD ADMINISTERED:none  DRAINS: none   LOCAL MEDICATIONS USED:  NONE  SPECIMEN:  No Specimen  DISPOSITION OF SPECIMEN:  N/A  COUNTS:  YES  TOURNIQUET:   Total Tourniquet Time Documented: Upper Arm (Right) - 21 minutes Total: Upper Arm (Right) - 21 minutes   DICTATION: .Viviann Spare Dictation  PLAN OF CARE: Discharge to home after PACU  PATIENT DISPOSITION:  PACU - hemodynamically stable.

## 2018-12-01 NOTE — Anesthesia Preprocedure Evaluation (Signed)
Anesthesia Evaluation  Patient identified by MRN, date of birth, ID band Patient awake    Reviewed: Allergy & Precautions, H&P , NPO status , Patient's Chart, lab work & pertinent test results  Airway Mallampati: II  TM Distance: >3 FB Neck ROM: full    Dental  (+) Teeth Intact, Dental Advidsory Given   Pulmonary neg pulmonary ROS, former smoker,    Pulmonary exam normal        Cardiovascular hypertension, Pt. on medications negative cardio ROS Normal cardiovascular exam  Echocardiogram 04/15/2018: Left ventricle cavity is normal in size. Moderate concentric hypertrophy of the left ventricle. Normal global wall motion. Doppler evidence of grade I (impaired) diastolic dysfunction, normal LAP. Calculated EF 62%. Inadequate TR jet to estimate pulmonary artery systolic pressure. Normal right atrial pressure.     Neuro/Psych  Headaches, Depression negative psych ROS   GI/Hepatic Neg liver ROS, GERD  Controlled and Medicated,  Endo/Other  negative endocrine ROS  Renal/GU negative Renal ROS  negative genitourinary   Musculoskeletal  (+) Arthritis , Osteoarthritis,    Abdominal   Peds negative pediatric ROS (+)  Hematology negative hematology ROS (+)   Anesthesia Other Findings   Reproductive/Obstetrics negative OB ROS                             Anesthesia Physical  Anesthesia Plan  ASA: III  Anesthesia Plan: MAC and Regional   Post-op Pain Management:  Regional for Post-op pain   Induction: Intravenous  PONV Risk Score and Plan: 2 and Treatment may vary due to age or medical condition  Airway Management Planned: Nasal Cannula, Simple Face Mask and Mask  Additional Equipment:   Intra-op Plan:   Post-operative Plan: Extubation in OR  Informed Consent: I have reviewed the patients History and Physical, chart, labs and discussed the procedure including the risks, benefits and  alternatives for the proposed anesthesia with the patient or authorized representative who has indicated his/her understanding and acceptance.       Plan Discussed with: Surgeon and CRNA  Anesthesia Plan Comments:         Anesthesia Quick Evaluation

## 2018-12-01 NOTE — H&P (Signed)
[name] is an 48 y.o. female.   Chief Complaint: Numbness and tingling right hand HPI: [name] is a 48 year old right-hand-dominant female referred by Dr. Alfonso Ramus for consultation regarding numbness and tingling ring and small finger right hand. This been going on for slightly over a year. Recalls no history of injury. She states pain at [facility] frequently help. She was given gabapentin which she did not like her take. She is tried to avoid taking nonsteroidal anti-inflammatories due to high blood pressure. He is awake in 3-4 out of 7 nights. She is not complaining of discomfort on the radial aspect of her hand she is not complain of great deal of pain other than a feeling of burning. No history of injury to the hand elbow or neck. She has no history of diabetes thyroid problems arthritis or gout. Family history is positive diabetes and arthritis. She has been tested. Not tried anything on her own. He has had nerve conductions done at lumbar neurology revealing a cubital tunnel syndrome right hand no evidence of carpal tunnel syndrome. These were read out as by Dr. Posey Pronto.   Past Medical History:  Diagnosis Date  . Abnormal Pap smear 2000   [facility]  . Allergy   . Back pain   . Breast fibroadenoma   . Diverticulitis of colon with perforation 05/20/2014  . GERD (gastroesophageal reflux disease)   . H/O metrorrhagia 12/2006  . H/O myomectomy 07-2006   robotic  . H/O sinusitis   . Headache(784.0)   . History of conization of cervix 2000  . History of ectopic pregnancy 2005   r salpyngectomy  . History of hysterectomy, supracervical 08-2008   robotic  . History of syphilis   . History of syphilis   . Hx of adenomatous polyp of colon 12/31/2017  . Hx of herpes simplex type 2 infection   . Hyperlipidemia   . Hypertension   . Ileostomy present ([place]) 10/26/2014  . Intra-abdominal abscess ([place]) 07/21/2014  . PMS (premenstrual syndrome)     Past Surgical History:  Procedure Laterality  Date  . ABDOMINAL HYSTERECTOMY    . BREAST LUMPECTOMY  10/11   rt-neg  . BREAST SURGERY  2004   fibroademoma  . CERVICAL CONE BIOPSY  2000  . COLON RESECTION N/A 07/06/2014   Procedure: LAPAROSCOPIC LOOP ILEOSTOMY WITH PLACEMENT OF PELVIC DRAIN;  Surgeon: Donnie Mesa, MD;  Location: [place];  Service: General;  Laterality: N/A;  . COLON SURGERY    . DILATION AND CURETTAGE OF UTERUS  2009  . DIVERTING ILEOSTOMY Right 06/2014  . ECTOPIC PREGNANCY SURGERY  2005   R salpyngectomy  . ILEOSTOMY N/A 10/26/2014   Procedure: LOOP ILEOSTOMY REVERSAL;  Surgeon: Donnie Mesa, MD;  Location: [facility];  Service: General;  Laterality: N/A;  . LAPAROSCOPIC SIGMOID COLECTOMY N/A 06/30/2014   Procedure: LAPAROSCOPIC ASSISTED SIGMOID COLECTOMY;  Surgeon: Donnie Mesa, MD;  Location: [place];  Service: General;  Laterality: N/A;  . MYOMECTOMY  2008   robotic  . POLYPECTOMY  2009  . ROBOTIC ASSISTED LAP VAGINAL HYSTERECTOMY  2010   supracervical  . SALPINGECTOMY  2005  . TRIGGER FINGER RELEASE Right 09/28/2012   Procedure: RELEASE TRIGGER FINGER/A-1 PULLEY RIGHT THUMB;  Surgeon: Jolyn Nap, MD;  Location: [place];  Service: Orthopedics;  Laterality: Right;     Family History  Problem Relation Age of Onset  . Hypertension Paternal Grandmother   . Diabetes Paternal Grandmother   . Hypertension Maternal Grandmother   .  Sarcoidosis Mother   . Hypertension Mother   . Hyperlipidemia Mother   . Thyroid disease Maternal Aunt   . Mental illness Cousin   . Fibroids Sister    Social History:  reports that she quit smoking about 7 years ago. Her smoking use included cigarettes. She has a 20.00 pack-year smoking history. She has never used smokeless tobacco. She reports previous alcohol use. She reports that she does not use drugs.  Allergies:  Allergies  Allergen Reactions  . Keflex [Cephalexin] Anaphylaxis  . Penicillins Anaphylaxis    Has patient had a PCN reaction causing immediate  rash, facial/tongue/throat swelling, SOB or lightheadedness with hypotension: Yes Has patient had a PCN reaction causing severe rash involving mucus membranes or skin necrosis: No Has patient had a PCN reaction that required hospitalization: No Has patient had a PCN reaction occurring within the last 10 years: No If all of the above answers are "NO", then may proceed with Cephalosporin use.     No medications prior to admission.    No results found for this or any previous visit (from the past 48 hour(s)).  No results found.   Pertinent items are noted in HPI.  Height 5\' 4"  (1.626 m), weight 96.6 kg.  General appearance: alert, cooperative and appears stated age Head: Normocephalic, without obvious abnormality Neck: no JVD Resp: clear to auscultation bilaterally Cardio: regular rate and rhythm, S1, S2 normal, no murmur, click, rub or gallop GI: soft, non-tender; bowel sounds normal; no masses,  no organomegaly Extremities: Numbness right hand Pulses: 2+ and symmetric Skin: Skin color, texture, turgor normal. No rashes or lesions Neurologic: Grossly normal Incision/Wound: na  Assessment/Plan Assessment:  1. Entrapment of right ulnar nerve    Plan: We have discussed surgical decompression with her. Pre-peri-and postoperative course are discussed along with risk and complications. She is aware that there is no guarantee to the surgery the possibility of infection recurrence injury to arteries nerves tendons complete relief symptoms dystrophy. She is advised that we are attempting to halt the process giving the nerve the opportunity to get better. She would like to proceed and is scheduled for decompression possible transposition ulnar nerve as an outpatient under regional anesthesia. She is aware that if the nerve is decompressed and subluxate's over the medial epicondyle the transposition will be necessary.   Daryll Brod 12/01/2018, 6:26 AM

## 2018-12-02 ENCOUNTER — Encounter (HOSPITAL_BASED_OUTPATIENT_CLINIC_OR_DEPARTMENT_OTHER): Payer: Self-pay | Admitting: Orthopedic Surgery

## 2018-12-02 NOTE — Anesthesia Postprocedure Evaluation (Signed)
Anesthesia Post Note  Patient: [name]  Procedure(s) Performed: ULNAR NERVE DECOMPRESSION (Right Elbow)     Anesthesia Post Evaluation  Last Vitals:  Vitals:   12/01/18 1200 12/01/18 1230  BP: 101/60 108/75  Pulse:  71  Resp:  16  Temp:  (!) 36.4 C  SpO2:      Last Pain:  Vitals:   12/01/18 1230  TempSrc:   PainSc: 4                  Bert Givans

## 2018-12-04 ENCOUNTER — Other Ambulatory Visit: Payer: Self-pay | Admitting: Family Medicine

## 2018-12-04 DIAGNOSIS — I10 Essential (primary) hypertension: Secondary | ICD-10-CM

## 2018-12-04 MED FILL — LISINOPRIL 10 MG TABS: 10 | 90 days supply | Qty: 90 | Fill #0

## 2018-12-07 MED FILL — HYDROCODON-APAP 5-325: 5-325 | 5 days supply | Qty: 20 | Fill #0

## 2018-12-11 ENCOUNTER — Encounter: Payer: Self-pay | Admitting: Family Medicine

## 2018-12-15 ENCOUNTER — Telehealth: Payer: Self-pay | Admitting: *Deleted

## 2018-12-15 MED FILL — HYDROCODON-APAP 5-325: 5-325 | 5 days supply | Qty: 20 | Fill #0

## 2018-12-15 NOTE — Telephone Encounter (Signed)
Patient no showed for Previsit today. Rescheduled Previsit for 12/25/2018 at 1100. Patient needs a covid test on 12/24/2018. Scheduled with the patient for 12/24/2018 at 910. Patient aware of address from previous covid test for surgery  This patient had a ulnar nerve decompression 12/01/2018. Patientis out of sling, no dressing and has been oked to drive. Suture removal scheduled for tomorrow.

## 2018-12-20 HISTORY — PX: COLONOSCOPY: SHX174

## 2018-12-24 ENCOUNTER — Other Ambulatory Visit: Payer: Self-pay | Admitting: Gastroenterology

## 2018-12-24 DIAGNOSIS — Z1159 Encounter for screening for other viral diseases: Secondary | ICD-10-CM | POA: Diagnosis not present

## 2018-12-24 LAB — SARS CORONAVIRUS 2 (TAT 6-24 HRS): SARS Coronavirus 2: NEGATIVE

## 2018-12-25 ENCOUNTER — Other Ambulatory Visit: Payer: Self-pay

## 2018-12-25 ENCOUNTER — Encounter: Payer: Self-pay | Admitting: Gastroenterology

## 2018-12-25 ENCOUNTER — Ambulatory Visit (AMBULATORY_SURGERY_CENTER): Payer: Self-pay | Admitting: *Deleted

## 2018-12-25 VITALS — Temp 96.9°F | Ht 64.0 in | Wt 213.0 lb

## 2018-12-25 DIAGNOSIS — Z86010 Personal history of colonic polyps: Secondary | ICD-10-CM

## 2018-12-25 MED ORDER — SUPREP BOWEL PREP KIT 17.5-3.13-1.6 GM/177ML PO SOLN: 1.0000 | Freq: Once | ORAL | 0 refills | Status: AC

## 2018-12-25 MED FILL — SUPREP BOWEL PREP KIT: 17.5-3.13-1 | 1 days supply | Qty: 354 | Fill #0

## 2018-12-25 NOTE — Progress Notes (Signed)
No egg or soy allergy known to patient  No issues with past sedation with any surgeries  or procedures, no intubation problems  No diet pills per patient No home 02 use per patient  No blood thinners per patient  Pt denies issues with constipation  No A fib or A flutter  EMMI video sent to pt's e mail  covid test was negative 11-5 Due to the COVID-19 pandemic we are asking patients to follow these guidelines. Please only bring one care partner. Please be aware that your care partner may wait in the car in the parking lot or if they feel like they will be too hot to wait in the car, they may wait in the lobby on the 4th floor. All care partners are required to wear a mask the entire time (we do not have any that we can provide them), they need to practice social distancing, and we will do a Covid check for all patient's and care partners when you arrive. Also we will check their temperature and your temperature. If the care partner waits in their car they need to stay in the parking lot the entire time and we will call them on their cell phone when the patient is ready for discharge so they can bring the car to the front of the building. Also all patient's will need to wear a mask into building.

## 2018-12-29 ENCOUNTER — Ambulatory Visit (AMBULATORY_SURGERY_CENTER): Payer: 59 | Admitting: Gastroenterology

## 2018-12-29 ENCOUNTER — Other Ambulatory Visit: Payer: Self-pay

## 2018-12-29 ENCOUNTER — Encounter: Payer: Self-pay | Admitting: Gastroenterology

## 2018-12-29 VITALS — BP 127/73 | HR 86 | Temp 98.2°F | Resp 16 | Ht 64.0 in | Wt 213.0 lb

## 2018-12-29 DIAGNOSIS — K6389 Other specified diseases of intestine: Secondary | ICD-10-CM | POA: Diagnosis not present

## 2018-12-29 DIAGNOSIS — K635 Polyp of colon: Secondary | ICD-10-CM | POA: Diagnosis not present

## 2018-12-29 DIAGNOSIS — D123 Benign neoplasm of transverse colon: Secondary | ICD-10-CM

## 2018-12-29 DIAGNOSIS — Z86010 Personal history of colonic polyps: Secondary | ICD-10-CM | POA: Diagnosis not present

## 2018-12-29 DIAGNOSIS — D122 Benign neoplasm of ascending colon: Secondary | ICD-10-CM

## 2018-12-29 DIAGNOSIS — Z1211 Encounter for screening for malignant neoplasm of colon: Secondary | ICD-10-CM | POA: Diagnosis not present

## 2018-12-29 DIAGNOSIS — D127 Benign neoplasm of rectosigmoid junction: Secondary | ICD-10-CM

## 2018-12-29 MED ORDER — SODIUM CHLORIDE 0.9 % IV SOLN: 500.0000 mL | Freq: Once | INTRAVENOUS | Status: DC

## 2018-12-29 NOTE — Op Note (Signed)
[facility] Patient Name: [name] Procedure Date: 12/29/2018 10:53 AM MRN: [id number] Endoscopist: Justice Britain , MD Age: 48 Referring MD:  Date of Birth: 1970-09-11 Gender: Female Account #: [id number] Procedure:                Colonoscopy Indications:              Surveillance: Personal history of adenomatous                            polyps on last colonoscopy 5 years ago Medicines:                History of diverticulitis s/p resection in 2016 Procedure:                Pre-Anesthesia Assessment:                           - Prior to the procedure, a History and Physical                            was performed, and patient medications and                            allergies were reviewed. The patient's tolerance of                            previous anesthesia was also reviewed. The risks                            and benefits of the procedure and the sedation                            options and risks were discussed with the patient.                            All questions were answered, and informed consent                            was obtained. Prior Anticoagulants: The patient has                            taken no previous anticoagulant or antiplatelet                            agents. ASA Grade Assessment: II - A patient with                            mild systemic disease. After reviewing the risks                            and benefits, the patient was deemed in                            satisfactory condition to undergo the procedure.                           -  Prior to the procedure, a History and Physical                            was performed, and patient medications and                            allergies were reviewed. The patient's tolerance of                            previous anesthesia was also reviewed. The risks                            and benefits of the procedure and the sedation   options and risks were discussed with the patient.                            All questions were answered, and informed consent                            was obtained. Prior Anticoagulants: The patient has                            taken no previous anticoagulant or antiplatelet                            agents. ASA Grade Assessment: II - A patient with                            mild systemic disease. After reviewing the risks                            and benefits, the patient was deemed in                            satisfactory condition to undergo the procedure.                           After obtaining informed consent, the colonoscope                            was passed under direct vision. Throughout the                            procedure, the patient's blood pressure, pulse, and                            oxygen saturations were monitored continuously. The                            Colonoscope was introduced through the anus and                            advanced to the the cecum, identified by the  ileocecal valve. The colonoscopy was performed                            without difficulty. The patient tolerated the                            procedure. The quality of the bowel preparation was                            good. The terminal ileum, ileocecal valve,                            appendiceal orifice, and rectum were photographed. Scope In: 10:55:50 AM Scope Out: 11:11:22 AM Scope Withdrawal Time: 0 hours 13 minutes 14 seconds  Total Procedure Duration: 0 hours 15 minutes 32 seconds  Findings:                 The digital rectal exam findings include                            hemorrhoids. Pertinent negatives include no                            palpable rectal lesions.                           The terminal ileum and ileocecal valve appeared                            normal.                           Five sessile polyps were found in  the hepatic                            flexure (2) and ascending colon (3). The polyps                            were 2 to 4 mm in size. These polyps were removed                            with a cold snare. Resection and retrieval were                            complete.                           There was evidence of a prior functional end-to-end                            colo-colonic anastomosis in the sigmoid colon. This                            was patent and was characterized by healthy  appearing mucosa. The anastomosis was traversed.                           Many sessile polyps were found in the rectum and                            recto-sigmoid colon. The polyps were 1 to 4 mm in                            size. Three of these were biopsied with a cold                            snare for histology to rule out hyperplastic polyps.                           Multiple small-mouthed diverticula were found in                            the recto-sigmoid colon and sigmoid colon.                           Non-bleeding non-thrombosed internal hemorrhoids                            were found during retroflexion, during perianal                            exam and during digital exam. The hemorrhoids were                            Grade II (internal hemorrhoids that prolapse but                            reduce spontaneously). Complications:            No immediate complications. Estimated Blood Loss:     Estimated blood loss was minimal. Impression:               - Hemorrhoids found on digital rectal exam.                           - The examined portion of the ileum was normal.                           - Five 2 to 4 mm polyps at the hepatic flexure and                            in the ascending colon, removed with a cold snare.                            Resected and retrieved.                           - Patent functional end-to-end colo-colonic  anastomosis, characterized by healthy appearing                            mucosa.                           - Many 1 to 4 mm polyps in the rectum and at the                            recto-sigmoid colon. Biopsied via snare polypectomy                            of 3 of these. Most likely hyperplastic and benign.                           - Diverticulosis in the recto-sigmoid colon and in                            the sigmoid colon.                           - Non-bleeding non-thrombosed internal hemorrhoids. Recommendation:           - The patient will be observed post-procedure,                            until all discharge criteria are met.                           - Discharge patient to home.                           - Patient has a contact number available for                            emergencies. The signs and symptoms of potential                            delayed complications were discussed with the                            patient. Return to normal activities tomorrow.                            Written discharge instructions were provided to the                            patient.                           - High fiber diet.                           - Use FiberCon 1 tablet PO daily.                           -  Continue present medications.                           - Await pathology results.                           - Repeat colonoscopy in 3 - 5 years for                            surveillance based on pathology results and                            findings of adenomatous tissue. If evidence of                            adenomatous tissue in the R/S & Rectal jar, then                            will need Flexible sigmoidoscopy within 2-month to                            remove all the polyps in this region.                           - The findings and recommendations were discussed                            with the patient. GJustice Britain MD 12/29/2018 11:19:29 AM

## 2018-12-29 NOTE — Progress Notes (Signed)
To PACU, VSS. Report to [email or web address] 

## 2018-12-29 NOTE — Patient Instructions (Signed)
YOU HAD AN ENDOSCOPIC PROCEDURE TODAY AT [facility]:   Refer to the procedure report that was given to you for any specific questions about what was found during the examination.  If the procedure report does not answer your questions, please call your gastroenterologist to clarify.  If you requested that your care partner not be given the details of your procedure findings, then the procedure report has been included in a sealed envelope for you to review at your convenience later.  YOU SHOULD EXPECT: Some feelings of bloating in the abdomen. Passage of more gas than usual.  Walking can help get rid of the air that was put into your GI tract during the procedure and reduce the bloating. If you had a lower endoscopy (such as a colonoscopy or flexible sigmoidoscopy) you may notice spotting of blood in your stool or on the toilet paper. If you underwent a bowel prep for your procedure, you may not have a normal bowel movement for a few days.  Please Note:  You might notice some irritation and congestion in your nose or some drainage.  This is from the oxygen used during your procedure.  There is no need for concern and it should clear up in a day or so.  SYMPTOMS TO REPORT IMMEDIATELY:   Following lower endoscopy (colonoscopy or flexible sigmoidoscopy):  Excessive amounts of blood in the stool  Significant tenderness or worsening of abdominal pains  Swelling of the abdomen that is new, acute  Fever of 100F or higher   For urgent or emergent issues, a gastroenterologist can be reached at any hour by calling [phone].   DIET:  We do recommend a small meal at first, but then you may proceed to your regular diet.  Drink plenty of fluids but you should avoid alcoholic beverages for 24 hours.  MEDICATIONS: Continue present medications. Use FiberCon 1 tablet by mouth daily.  Please see handouts given to you by your recovery nurse.  ACTIVITY:  You should plan to take it easy  for the rest of today and you should NOT DRIVE or use heavy machinery until tomorrow (because of the sedation medicines used during the test).    FOLLOW UP: Our staff will call the number listed on your records 48-72 hours following your procedure to check on you and address any questions or concerns that you may have regarding the information given to you following your procedure. If we do not reach you, we will leave a message.  We will attempt to reach you two times.  During this call, we will ask if you have developed any symptoms of COVID 19. If you develop any symptoms (ie: fever, flu-like symptoms, shortness of breath, cough etc.) before then, please call [phone].  If you test positive for Covid 19 in the 2 weeks post procedure, please call and report this information to [place].    If any biopsies were taken you will be contacted by phone or by letter within the next 1-3 weeks.  Please call us at [phone] if you have not heard about the biopsies in 3 weeks.   Thank you for allowing [place] to provide for your healthcare needs today.   SIGNATURES/CONFIDENTIALITY: You and/or your care partner have signed paperwork which will be entered into your electronic medical record.  These signatures attest to the fact that that the information above on your After Visit Summary has been reviewed and is understood.  Full responsibility of the confidentiality of  this discharge information lies with you and/or your care-partner.

## 2018-12-29 NOTE — Progress Notes (Signed)
Pt's states no medical or surgical changes since previsit or office visit.  LC -temp CW - vitals 

## 2018-12-29 NOTE — Addendum Note (Signed)
Addended by: Christell Constant F on: 12/29/2018 03:23 PM   Modules accepted: Orders

## 2018-12-29 NOTE — Progress Notes (Signed)
Called to room to assist during endoscopic procedure.  Patient ID and intended procedure confirmed with present staff. Received instructions for my participation in the procedure from the performing physician.  

## 2018-12-31 ENCOUNTER — Other Ambulatory Visit: Payer: Self-pay

## 2018-12-31 ENCOUNTER — Telehealth: Payer: Self-pay

## 2018-12-31 ENCOUNTER — Ambulatory Visit (INDEPENDENT_AMBULATORY_CARE_PROVIDER_SITE_OTHER): Payer: 59 | Admitting: Neurology

## 2018-12-31 DIAGNOSIS — R202 Paresthesia of skin: Secondary | ICD-10-CM | POA: Diagnosis not present

## 2018-12-31 NOTE — Procedures (Signed)
[facility]  [place], [address]  [place], [place] [address] Tel: [phone] Fax:  [phone] Test Date:  12/31/2018  Patient: [name] DOB: 1971/01/04 Physician: Narda Amber, DO  Sex: Female Height: 5\' 4"  Ref Phys: Berle Mull, MD  ID#: [id number] Temp: 35.0C Technician:    Patient Complaints: This is a 48 year old female referred for evaluation of numbness and tingling over the hand, worse over the ulnar distribution.  NCV & EMG Findings: Extensive electrodiagnostic testing of the left upper extremity shows:  1. Left median, ulnar, and mixed palmar sensory responses are within normal limits. 2. Left median and ulnar motor responses are within normal limits. 3. There is no evidence of active or chronic motor axonal loss changes affecting any of the tested muscles.  Motor unit configuration and recruitment pattern is within normal limits.  Impression: This is a normal study of the left upper extremity.  In particular, there is no evidence of carpal tunnel syndrome, ulnar neuropathy, or cervical radiculopathy.   ___________________________ Narda Amber, DO    Nerve Conduction Studies Anti Sensory Summary Table   Site NR Peak (ms) Norm Peak (ms) P-T Amp (V) Norm P-T Amp  Left Median Anti Sensory (2nd Digit)  35C  Wrist    2.8 <3.4 43.5 >20  Left Ulnar Anti Sensory (5th Digit)  35C  Wrist    2.5 <3.1 38.4 >12   Motor Summary Table   Site NR Onset (ms) Norm Onset (ms) O-P Amp (mV) Norm O-P Amp Site1 Site2 Delta-0 (ms) Dist (cm) Vel (m/s) Norm Vel (m/s)  Left Median Motor (Abd Poll Brev)  35C  Wrist    2.3 <3.9 10.1 >6 Elbow Wrist 4.2 28.0 67 >50  Elbow    6.5  10.1         Left Ulnar Motor (Abd Dig Minimi)  35C  Wrist    2.1 <3.1 9.0 >7 B Elbow Wrist 3.5 23.0 66 >50  B Elbow    5.6  7.5  A Elbow B Elbow 1.6 10.0 62 >50  A Elbow    7.2  7.0          Comparison Summary Table   Site NR Peak (ms) Norm Peak (ms) P-T Amp (V) Site1 Site2  Delta-P (ms) Norm Delta (ms)  Left Median/Ulnar Palm Comparison (Wrist - 8cm)  35C  Median Palm    1.6 <2.2 59.8 Median Palm Ulnar Palm 0.0   Ulnar Palm    1.6 <2.2 18.4       EMG   Side Muscle Ins Act Fibs Psw Fasc Number Recrt Dur Dur. Amp Amp. Poly Poly. Comment  Left 1stDorInt Nml Nml Nml Nml Nml Nml Nml Nml Nml Nml Nml Nml N/A  Left PronatorTeres Nml Nml Nml Nml Nml Nml Nml Nml Nml Nml Nml Nml N/A  Left Biceps Nml Nml Nml Nml Nml Nml Nml Nml Nml Nml Nml Nml N/A  Left Triceps Nml Nml Nml Nml Nml Nml Nml Nml Nml Nml Nml Nml N/A  Left Deltoid Nml Nml Nml Nml Nml Nml Nml Nml Nml Nml Nml Nml N/A  Left FlexCarpiUln Nml Nml Nml Nml Nml Nml Nml Nml Nml Nml Nml Nml N/A      Waveforms:

## 2018-12-31 NOTE — Telephone Encounter (Signed)
No answer, left message to call back later today, B.Soni Kegel RN. 

## 2018-12-31 NOTE — Telephone Encounter (Signed)
Second post procedure follow up call, no answer 

## 2019-01-07 ENCOUNTER — Encounter: Payer: Self-pay | Admitting: Gastroenterology

## 2019-01-08 MED FILL — traMADol HCL 50 MG TABS: 50 | 8 days supply | Qty: 30 | Fill #0

## 2019-01-11 MED FILL — ATORVASTATIN 20 MG TABLET: 20 | 90 days supply | Qty: 90 | Fill #0

## 2019-01-11 MED FILL — AMLODIPINE BESYLATE 5 MG TA: 5 | 90 days supply | Qty: 90 | Fill #0

## 2019-02-17 ENCOUNTER — Other Ambulatory Visit: Payer: Self-pay

## 2019-02-17 ENCOUNTER — Encounter: Payer: Self-pay | Admitting: Emergency Medicine

## 2019-02-17 ENCOUNTER — Ambulatory Visit (INDEPENDENT_AMBULATORY_CARE_PROVIDER_SITE_OTHER): Payer: 59 | Admitting: Emergency Medicine

## 2019-02-17 VITALS — BP 105/56 | HR 105 | Temp 97.4°F | Resp 16 | Ht 64.0 in | Wt 217.4 lb

## 2019-02-17 DIAGNOSIS — T07XXXA Unspecified multiple injuries, initial encounter: Secondary | ICD-10-CM | POA: Diagnosis not present

## 2019-02-17 DIAGNOSIS — M7918 Myalgia, other site: Secondary | ICD-10-CM

## 2019-02-17 DIAGNOSIS — W19XXXA Unspecified fall, initial encounter: Secondary | ICD-10-CM

## 2019-02-17 MED ORDER — HYDROCODONE-ACETAMINOPHEN 5-325 MG PO TABS: 1.0000 | ORAL_TABLET | Freq: Four times a day (QID) | ORAL | 0 refills | Status: DC | PRN

## 2019-02-17 NOTE — Progress Notes (Signed)
[name] 48 y.o.   Chief Complaint  Patient presents with  . Fall    patient fell and pulled muscles in both legs but the right leg hurts more .   Marland Kitchen Neck Pain    and back pain as well since monday.    HISTORY OF PRESENT ILLNESS: This is a 47 y.o. female complaining of pain to upper back and thigh muscles following injury 2 days ago when she slipped and fell down.  No other injuries or significant symptoms.  No head injury or loss of consciousness.  HPI   Prior to Admission medications   Medication Sig Start Date End Date Taking? Authorizing Provider  atorvastatin (LIPITOR) 20 MG tablet Take 1 tablet (20 mg total) by mouth daily. 10/14/18  Yes Libby Maw, MD  Black Cohosh (REMIFEMIN) 20 MG TABS Take 40 mg by mouth daily.   Yes [provider]  ibuprofen (ADVIL,MOTRIN) 800 MG tablet Take 1 tablet (800 mg total) by mouth every 8 (eight) hours as needed. 03/25/18  Yes Wendie Agreste, MD  lisinopril (ZESTRIL) 10 MG tablet TAKE 1 TABLET (10 MG TOTAL) BY MOUTH DAILY. 12/04/18  Yes Libby Maw, MD  Multiple Vitamins-Minerals (HAIR/SKIN/NAILS/BIOTIN PO) Take 1 tablet by mouth daily.   Yes [provider]  amLODipine (NORVASC) 5 MG tablet Take 1 tablet (5 mg total) by mouth daily. 04/24/18 12/25/18  Adrian Prows, MD  HYDROcodone-acetaminophen (NORCO) 5-325 MG tablet Take 1 tablet by mouth every 6 (six) hours as needed. 02/17/19   Horald Pollen, MD  nitroGLYCERIN (NITROSTAT) 0.4 MG SL tablet Place 1 tablet (0.4 mg total) under the tongue every 5 (five) minutes as needed for up to 25 days for chest pain. 04/02/18 12/25/18  Adrian Prows, MD    Allergies  Allergen Reactions  . Keflex [Cephalexin] Anaphylaxis  . Penicillins Anaphylaxis    Has patient had a PCN reaction causing immediate rash, facial/tongue/throat swelling, SOB or lightheadedness with hypotension: Yes Has patient had a PCN reaction causing severe rash involving mucus membranes or  skin necrosis: No Has patient had a PCN reaction that required hospitalization: No Has patient had a PCN reaction occurring within the last 10 years: No If all of the above answers are "NO", then may proceed with Cephalosporin use.     Patient Active Problem List   Diagnosis Date Noted  . Elevated glucose 10/08/2018  . Elevated LDL cholesterol level 10/08/2018  . Healthcare maintenance 10/08/2018  . Need for immunization against influenza 10/08/2018  . Obesity (BMI 30-39.9) 03/29/2018  . Acute chest pain 03/27/2018  . Nonspecific abnormal electrocardiogram (ECG) (EKG) 03/27/2018  . Atypical chest pain 03/27/2018  . Hand pain, right 03/25/2018  . Dyslipidemia 08/15/2017  . History of hysterectomy, supracervical 02/14/2017  . Trigger ring finger of right hand 08/07/2015  . Primary osteoarthritis of first carpometacarpal joint of right hand 08/07/2015  . Diverticulitis of sigmoid colon s/p colectomy 06/30/2014 07/26/2014  . Tachycardia 02/04/2014  . Hx of adenomatous polyp of colon 12/23/2013  . Essential hypertension 11/02/2013  . Former smoker 11/26/2008    Past Medical History:  Diagnosis Date  . Abnormal Pap smear 2000   [facility] Biopsy  . Allergy   . Arthritis    hands  . Back pain   . Blood transfusion without reported diagnosis 2005  . Breast fibroadenoma   . Diverticulitis of colon with perforation 05/20/2014  . GERD (gastroesophageal reflux disease)   . H/O metrorrhagia 12/2006  . H/O myomectomy  07-2006   robotic  . H/O sinusitis   . Headache(784.0)   . Heart murmur   . History of conization of cervix 2000  . History of ectopic pregnancy 2005   r salpyngectomy  . History of hysterectomy, supracervical 08-2008   robotic  . History of syphilis   . History of syphilis   . Hx of adenomatous polyp of colon 12/31/2017  . Hx of herpes simplex type 2 infection   . Hyperlipidemia   . Hypertension   . Ileostomy present ([place]) 10/26/2014  . Intra-abdominal abscess ([place])  07/21/2014  . PMS (premenstrual syndrome)     Past Surgical History:  Procedure Laterality Date  . ABDOMINAL HYSTERECTOMY    . BREAST LUMPECTOMY  10/11   rt-neg  . BREAST SURGERY  2004   fibroademoma  . CERVICAL CONE BIOPSY  2000  . COLON RESECTION N/A 07/06/2014   Procedure: LAPAROSCOPIC LOOP ILEOSTOMY WITH PLACEMENT OF PELVIC DRAIN;  Surgeon: Donnie Mesa, MD;  Location: [place];  Service: General;  Laterality: N/A;  . COLON SURGERY     diverticulitis   . COLONOSCOPY    . DILATION AND CURETTAGE OF UTERUS  2009  . DIVERTING ILEOSTOMY Right 06/2014  . ECTOPIC PREGNANCY SURGERY  2005   R salpyngectomy  . ILEOSTOMY N/A 10/26/2014   Procedure: LOOP ILEOSTOMY REVERSAL;  Surgeon: Donnie Mesa, MD;  Location: [place];  Service: General;  Laterality: N/A;  . LAPAROSCOPIC SIGMOID COLECTOMY N/A 06/30/2014   Procedure: LAPAROSCOPIC ASSISTED SIGMOID COLECTOMY;  Surgeon: Donnie Mesa, MD;  Location: [place];  Service: General;  Laterality: N/A;  . MYOMECTOMY  2008   robotic  . POLYPECTOMY  2009  . ROBOTIC ASSISTED LAP VAGINAL HYSTERECTOMY  2010   supracervical  . SALPINGECTOMY  2005  . TRIGGER FINGER RELEASE Right 09/28/2012   Procedure: RELEASE TRIGGER FINGER/A-1 PULLEY RIGHT THUMB;  Surgeon: Jolyn Nap, MD;  Location: [place];  Service: Orthopedics;  Laterality: Right;  . ULNAR NERVE TRANSPOSITION Right 12/01/2018   Procedure: ULNAR NERVE DECOMPRESSION;  Surgeon: Daryll Brod, MD;  Location: [place];  Service: Orthopedics;  Laterality: Right;  AXILLARY    Social History   Socioeconomic History  . Marital status: Married    Spouse name: [name]  . Number of children: 2  . Years of education: Not on file  . Highest education level: Associate degree: occupational, Hotel manager, or vocational program  Occupational History  . Occupation: CMA    Comment: [employer] Use  . Smoking status: Former Smoker    Packs/day: 1.00    Years: 20.00     Pack years: 20.00    Types: Cigarettes    Quit date: 06/13/2011    Years since quitting: 7.6  . Smokeless tobacco: Never Used  Substance and Sexual Activity  . Alcohol use: Not Currently  . Drug use: No  . Sexual activity: Yes  Other Topics Concern  . Not on file  Social History Narrative   Lives with her husband and their son.   Social Determinants of Health   Financial Resource Strain:   . Difficulty of Paying Living Expenses: Not on file  Food Insecurity:   . Worried About Charity fundraiser in the Last Year: Not on file  . Ran Out of Food in the Last Year: Not on file  Transportation Needs:   . Lack of Transportation (Medical): Not on file  . Lack of Transportation (Non-Medical): Not on file  Physical Activity:   . Days of Exercise per Week: Not on file  . Minutes of Exercise per Session: Not on file  Stress:   . Feeling of Stress : Not on file  Social Connections:   . Frequency of Communication with Friends and Family: Not on file  . Frequency of Social Gatherings with Friends and Family: Not on file  . Attends Religious Services: Not on file  . Active Member of Clubs or Organizations: Not on file  . Attends Archivist Meetings: Not on file  . Marital Status: Not on file  Intimate Partner Violence:   . Fear of Current or Ex-Partner: Not on file  . Emotionally Abused: Not on file  . Physically Abused: Not on file  . Sexually Abused: Not on file    Family History  Problem Relation Age of Onset  . Hypertension Paternal Grandmother   . Diabetes Paternal Grandmother   . Hypertension Maternal Grandmother   . Sarcoidosis Mother   . Hypertension Mother   . Hyperlipidemia Mother   . Thyroid disease Maternal Aunt   . Mental illness Cousin   . Fibroids Sister   . Colon cancer Neg Hx   . Colon polyps Neg Hx   . Esophageal cancer Neg Hx   . Rectal cancer Neg Hx   . Stomach cancer Neg Hx      Review of Systems  Constitutional: Negative.  Negative for  chills and fever.  HENT: Negative.  Negative for congestion and sore throat.   Respiratory: Negative.  Negative for cough and shortness of breath.   Cardiovascular: Negative.  Negative for chest pain and palpitations.  Gastrointestinal: Negative.  Negative for abdominal pain, blood in stool, diarrhea, nausea and vomiting.  Genitourinary: Negative.  Negative for dysuria and hematuria.  Musculoskeletal:       Muscle pain to both thighs left more than right and upper back.  Skin: Negative.  Negative for rash.  Neurological: Negative.  Negative for dizziness and headaches.  All other systems reviewed and are negative.  Today's Vitals   02/17/19 1346  BP: (!) 105/56  Pulse: (!) 105  Resp: 16  Temp: (!) 97.4 F (36.3 C)  TempSrc: Oral  SpO2: 96%  Weight: 217 lb 6.4 oz (98.6 kg)  Height: 5\' 4"  (1.626 m)   Body mass index is 37.32 kg/m.   Physical Exam Vitals reviewed.  Constitutional:      Appearance: Normal appearance.  HENT:     Head: Normocephalic.  Eyes:     Extraocular Movements: Extraocular movements intact.     Pupils: Pupils are equal, round, and reactive to light.  Cardiovascular:     Rate and Rhythm: Normal rate.  Pulmonary:     Effort: Pulmonary effort is normal.  Musculoskeletal:     Cervical back: Normal range of motion.     Comments: Positive muscle tenderness to left trapezius and both anterior thighs  Skin:    General: Skin is warm and dry.     Capillary Refill: Capillary refill takes less than 2 seconds.  Neurological:     General: No focal deficit present.     Mental Status: She is alert and oriented to person, place, and time.  Psychiatric:        Mood and Affect: Mood normal.        Behavior: Behavior normal.      ASSESSMENT & PLAN: [name] was seen today for fall and neck pain.  Diagnoses and all orders for this  visit:  Multiple contusions -     HYDROcodone-acetaminophen (NORCO) 5-325 MG tablet; Take 1 tablet by mouth every 6 (six) hours as  needed.  Musculoskeletal pain  Accidental fall, initial encounter    Patient Instructions       If you have lab work done today you will be contacted with your lab results within the next 2 weeks.  If you have not heard from [place] then please contact us. The fastest way to get your results is to register for My Chart.   IF you received an x-ray today, you will receive an invoice from [facility]. Please contact [facility] at [phone] with questions or concerns regarding your invoice.   IF you received labwork today, you will receive an invoice from [facility]. Please contact [facility] at [phone] with questions or concerns regarding your invoice.   Our billing staff will not be able to assist you with questions regarding bills from these companies.  You will be contacted with the lab results as soon as they are available. The fastest way to get your results is to activate your My Chart account. Instructions are located on the last page of this paperwork. If you have not heard from [place] regarding the results in 2 weeks, please contact this office.     Contusion A contusion is a deep bruise. This is a result of an injury that causes bleeding under the skin. Symptoms of bruising include pain, swelling, and discolored skin. The skin may turn blue, purple, or yellow. Follow these instructions at home: Managing pain, stiffness, and swelling You may use RICE. This stands for:  Resting.  Icing.  Compression, or putting pressure.  Elevating, or raising the injured area. To follow this method, do these actions:  Rest the injured area.  If told, put ice on the injured area. ? Put ice in a plastic bag. ? Place a towel between your skin and the bag. ? Leave the ice on for 20 minutes, 2-3 times per day.  If told, put light pressure (compression) on the injured area using an elastic bandage. Make sure the bandage is not too tight. If the area tingles or becomes  numb, remove it and put it back on as told by your doctor.  If possible, raise (elevate) the injured area above the level of your heart while you are sitting or lying down.  General instructions  Take over-the-counter and prescription medicines only as told by your doctor.  Keep all follow-up visits as told by your doctor. This is important. Contact a doctor if:  Your symptoms do not get better after several days of treatment.  Your symptoms get worse.  You have trouble moving the injured area. Get help right away if:  You have very bad pain.  You have a loss of feeling (numbness) in a hand or foot.  Your hand or foot turns pale or cold. Summary  A contusion is a deep bruise. This is a result of an injury that causes bleeding under the skin.  Symptoms of bruising include pain, swelling, and discolored skin. The skin may turn blue, purple, or yellow.  This condition is treated with rest, ice, compression, and elevation. This is also called RICE. You may be given over-the-counter medicines for pain.  Contact a doctor if you do not feel better, or you feel worse. Get help right away if you have very bad pain, have lost feeling in a hand or foot, or the area turns pale or cold.  This information is not intended to replace advice given to you by your health care provider. Make sure you discuss any questions you have with your health care provider. Document Released: 07/24/2007 Document Revised: 09/26/2017 Document Reviewed: 09/26/2017 Elsevier Patient Education  2020 Elsevier Inc.     Agustina Caroli, MD [facility]

## 2019-02-17 NOTE — Patient Instructions (Addendum)
If you have lab work done today you will be contacted with your lab results within the next 2 weeks.  If you have not heard from [place] then please contact us. The fastest way to get your results is to register for My Chart.   IF you received an x-ray today, you will receive an invoice from [facility]. Please contact [facility] at [phone] with questions or concerns regarding your invoice.   IF you received labwork today, you will receive an invoice from [facility]. Please contact [facility] at [phone] with questions or concerns regarding your invoice.   Our billing staff will not be able to assist you with questions regarding bills from these companies.  You will be contacted with the lab results as soon as they are available. The fastest way to get your results is to activate your My Chart account. Instructions are located on the last page of this paperwork. If you have not heard from [place] regarding the results in 2 weeks, please contact this office.     Contusion A contusion is a deep bruise. This is a result of an injury that causes bleeding under the skin. Symptoms of bruising include pain, swelling, and discolored skin. The skin may turn blue, purple, or yellow. Follow these instructions at home: Managing pain, stiffness, and swelling You may use RICE. This stands for:  Resting.  Icing.  Compression, or putting pressure.  Elevating, or raising the injured area. To follow this method, do these actions:  Rest the injured area.  If told, put ice on the injured area. ? Put ice in a plastic bag. ? Place a towel between your skin and the bag. ? Leave the ice on for 20 minutes, 2-3 times per day.  If told, put light pressure (compression) on the injured area using an elastic bandage. Make sure the bandage is not too tight. If the area tingles or becomes numb, remove it and put it back on as told by your doctor.  If possible, raise (elevate) the injured  area above the level of your heart while you are sitting or lying down.  General instructions  Take over-the-counter and prescription medicines only as told by your doctor.  Keep all follow-up visits as told by your doctor. This is important. Contact a doctor if:  Your symptoms do not get better after several days of treatment.  Your symptoms get worse.  You have trouble moving the injured area. Get help right away if:  You have very bad pain.  You have a loss of feeling (numbness) in a hand or foot.  Your hand or foot turns pale or cold. Summary  A contusion is a deep bruise. This is a result of an injury that causes bleeding under the skin.  Symptoms of bruising include pain, swelling, and discolored skin. The skin may turn blue, purple, or yellow.  This condition is treated with rest, ice, compression, and elevation. This is also called RICE. You may be given over-the-counter medicines for pain.  Contact a doctor if you do not feel better, or you feel worse. Get help right away if you have very bad pain, have lost feeling in a hand or foot, or the area turns pale or cold. This information is not intended to replace advice given to you by your health care provider. Make sure you discuss any questions you have with your health care provider. Document Released: 07/24/2007 Document Revised: 09/26/2017 Document Reviewed: 09/26/2017 Elsevier Patient Education  2020 Elsevier Inc.  

## 2019-02-27 ENCOUNTER — Other Ambulatory Visit: Payer: Self-pay

## 2019-02-27 ENCOUNTER — Ambulatory Visit (HOSPITAL_COMMUNITY)
Admission: EM | Admit: 2019-02-27 | Discharge: 2019-02-27 | Disposition: A | Discharge status: Home / Self Care | Payer: 59 | Attending: Emergency Medicine | Admitting: Emergency Medicine

## 2019-02-27 ENCOUNTER — Encounter (HOSPITAL_COMMUNITY): Payer: Self-pay

## 2019-02-27 DIAGNOSIS — L0291 Cutaneous abscess, unspecified: Secondary | ICD-10-CM | POA: Diagnosis not present

## 2019-02-27 DIAGNOSIS — B9689 Other specified bacterial agents as the cause of diseases classified elsewhere: Secondary | ICD-10-CM

## 2019-02-27 MED ORDER — IBUPROFEN 800 MG PO TABS
800.0000 mg | ORAL_TABLET | Freq: Once | ORAL | Status: AC
  Administered 2019-02-27: 800 mg via ORAL

## 2019-02-27 MED ORDER — IBUPROFEN 800 MG PO TABS
ORAL_TABLET | ORAL | Status: AC
  Filled 2019-02-27: qty 1

## 2019-02-27 MED ORDER — DOXYCYCLINE HYCLATE 100 MG PO CAPS: 100.0000 mg | ORAL_CAPSULE | Freq: Two times a day (BID) | ORAL | 0 refills | Status: DC

## 2019-02-27 MED ORDER — IBUPROFEN 800 MG PO TABS: 800.0000 mg | ORAL_TABLET | Freq: Three times a day (TID) | ORAL | 0 refills | Status: DC

## 2019-02-27 NOTE — ED Triage Notes (Signed)
Pt presents with abscess on right inner thigh that she noticed on Monday.

## 2019-02-27 NOTE — ED Provider Notes (Signed)
[facility]    CSN: [id number] Arrival date & time: 02/27/19  1127      History   Chief Complaint Chief Complaint  Patient presents with  . Abscess    HPI [name] is a 49 y.o. female.   Harika Pate-Kronenberger presents with complaints of abscess to right proximal inner thigh she noted on 1/4. Has gotten and larger since. Has been applying warm compresses and warm soaks but has not drained. No fevers. Has required I&D in the past with similar, primarily to axilla. No fevers. Endorses history of MRSA.   ROS per HPI, negative if not otherwise mentioned.      Past Medical History:  Diagnosis Date  . Abnormal Pap smear 2000   [facility] Biopsy  . Allergy   . Arthritis    hands  . Back pain   . Blood transfusion without reported diagnosis 2005  . Breast fibroadenoma   . Diverticulitis of colon with perforation 05/20/2014  . GERD (gastroesophageal reflux disease)   . H/O metrorrhagia 12/2006  . H/O myomectomy 07-2006   robotic  . H/O sinusitis   . Headache(784.0)   . Heart murmur   . History of conization of cervix 2000  . History of ectopic pregnancy 2005   r salpyngectomy  . History of hysterectomy, supracervical 08-2008   robotic  . History of syphilis   . History of syphilis   . Hx of adenomatous polyp of colon 12/31/2017  . Hx of herpes simplex type 2 infection   . Hyperlipidemia   . Hypertension   . Ileostomy present ([place]) 10/26/2014  . Intra-abdominal abscess ([place]) 07/21/2014  . PMS (premenstrual syndrome)     Patient Active Problem List   Diagnosis Date Noted  . Elevated glucose 10/08/2018  . Elevated LDL cholesterol level 10/08/2018  . Healthcare maintenance 10/08/2018  . Need for immunization against influenza 10/08/2018  . Obesity (BMI 30-39.9) 03/29/2018  . Acute chest pain 03/27/2018  . Nonspecific abnormal electrocardiogram (ECG) (EKG) 03/27/2018  . Atypical chest pain 03/27/2018  . Hand pain, right 03/25/2018  . Dyslipidemia  08/15/2017  . History of hysterectomy, supracervical 02/14/2017  . Trigger ring finger of right hand 08/07/2015  . Primary osteoarthritis of first carpometacarpal joint of right hand 08/07/2015  . Diverticulitis of sigmoid colon s/p colectomy 06/30/2014 07/26/2014  . Tachycardia 02/04/2014  . Hx of adenomatous polyp of colon 12/23/2013  . Essential hypertension 11/02/2013  . Former smoker 11/26/2008    Past Surgical History:  Procedure Laterality Date  . ABDOMINAL HYSTERECTOMY    . BREAST LUMPECTOMY  10/11   rt-neg  . BREAST SURGERY  2004   fibroademoma  . CERVICAL CONE BIOPSY  2000  . COLON RESECTION N/A 07/06/2014   Procedure: LAPAROSCOPIC LOOP ILEOSTOMY WITH PLACEMENT OF PELVIC DRAIN;  Surgeon: Donnie Mesa, MD;  Location: [facility];  Service: General;  Laterality: N/A;  . COLON SURGERY     diverticulitis   . COLONOSCOPY    . DILATION AND CURETTAGE OF UTERUS  2009  . DIVERTING ILEOSTOMY Right 06/2014  . ECTOPIC PREGNANCY SURGERY  2005   R salpyngectomy  . ILEOSTOMY N/A 10/26/2014   Procedure: LOOP ILEOSTOMY REVERSAL;  Surgeon: Donnie Mesa, MD;  Location: [facility];  Service: General;  Laterality: N/A;  . LAPAROSCOPIC SIGMOID COLECTOMY N/A 06/30/2014   Procedure: LAPAROSCOPIC ASSISTED SIGMOID COLECTOMY;  Surgeon: Donnie Mesa, MD;  Location: [facility];  Service: General;  Laterality: N/A;  . MYOMECTOMY  2008   robotic  .  POLYPECTOMY  2009  . ROBOTIC ASSISTED LAP VAGINAL HYSTERECTOMY  2010   supracervical  . SALPINGECTOMY  2005  . TRIGGER FINGER RELEASE Right 09/28/2012   Procedure: RELEASE TRIGGER FINGER/A-1 PULLEY RIGHT THUMB;  Surgeon: Jolyn Nap, MD;  Location: [place];  Service: Orthopedics;  Laterality: Right;  . ULNAR NERVE TRANSPOSITION Right 12/01/2018   Procedure: ULNAR NERVE DECOMPRESSION;  Surgeon: Daryll Brod, MD;  Location: [facility];  Service: Orthopedics;  Laterality: Right;  AXILLARY    OB History    Gravida  4   Para  2    Term  2   Preterm      AB  2   Living  2     SAB  0   TAB  1   Ectopic  1   Multiple  0   Live Births               Home Medications    Prior to Admission medications   Medication Sig Start Date End Date Taking? Authorizing Provider  amLODipine (NORVASC) 5 MG tablet Take 1 tablet (5 mg total) by mouth daily. 04/24/18 12/25/18  Adrian Prows, MD  atorvastatin (LIPITOR) 20 MG tablet Take 1 tablet (20 mg total) by mouth daily. 10/14/18   Libby Maw, MD  Black Cohosh (REMIFEMIN) 20 MG TABS Take 40 mg by mouth daily.    [provider]  doxycycline (VIBRAMYCIN) 100 MG capsule Take 1 capsule (100 mg total) by mouth 2 (two) times daily. 02/27/19   Zigmund Gottron, NP  HYDROcodone-acetaminophen (NORCO) 5-325 MG tablet Take 1 tablet by mouth every 6 (six) hours as needed. 02/17/19   Horald Pollen, MD  ibuprofen (ADVIL) 800 MG tablet Take 1 tablet (800 mg total) by mouth 3 (three) times daily. 02/27/19   Augusto Gamble B, NP  lisinopril (ZESTRIL) 10 MG tablet TAKE 1 TABLET (10 MG TOTAL) BY MOUTH DAILY. 12/04/18   Libby Maw, MD  Multiple Vitamins-Minerals (HAIR/SKIN/NAILS/BIOTIN PO) Take 1 tablet by mouth daily.    [provider]  nitroGLYCERIN (NITROSTAT) 0.4 MG SL tablet Place 1 tablet (0.4 mg total) under the tongue every 5 (five) minutes as needed for up to 25 days for chest pain. 04/02/18 12/25/18  Adrian Prows, MD    Family History Family History  Problem Relation Age of Onset  . Hypertension Paternal Grandmother   . Diabetes Paternal Grandmother   . Hypertension Maternal Grandmother   . Sarcoidosis Mother   . Hypertension Mother   . Hyperlipidemia Mother   . Thyroid disease Maternal Aunt   . Mental illness Cousin   . Fibroids Sister   . Colon cancer Neg Hx   . Colon polyps Neg Hx   . Esophageal cancer Neg Hx   . Rectal cancer Neg Hx   . Stomach cancer Neg Hx     Social History Social History   Tobacco Use  . Smoking  status: Former Smoker    Packs/day: 1.00    Years: 20.00    Pack years: 20.00    Types: Cigarettes    Quit date: 06/13/2011    Years since quitting: 7.7  . Smokeless tobacco: Never Used  Substance Use Topics  . Alcohol use: Not Currently  . Drug use: No     Allergies   Keflex [cephalexin] and Penicillins   Review of Systems Review of Systems   Physical Exam Triage Vital Signs ED Triage Vitals  Enc Vitals Group  BP 02/27/19 1216 111/61     Pulse Rate 02/27/19 1216 80     Resp 02/27/19 1216 17     Temp 02/27/19 1216 98.3 F (36.8 C)     Temp Source 02/27/19 1216 Oral     SpO2 02/27/19 1216 100 %     Weight --      Height --      Head Circumference --      Peak Flow --      Pain Score 02/27/19 1214 6     Pain Loc --      Pain Edu? --      Excl. in [place]? --    No data found.  Updated Vital Signs BP 111/61 (BP Location: Right Arm)   Pulse 80   Temp 98.3 F (36.8 C) (Oral)   Resp 17   SpO2 100%   Visual Acuity Right Eye Distance:   Left Eye Distance:   Bilateral Distance:    Right Eye Near:   Left Eye Near:    Bilateral Near:     Physical Exam Constitutional:      General: She is not in acute distress.    Appearance: She is well-developed.  Cardiovascular:     Rate and Rhythm: Normal rate.  Pulmonary:     Effort: Pulmonary effort is normal.  Skin:    General: Skin is warm and dry.          Comments: Approximately 1 cm fluctuant abscess to right proximal inner thigh, tender, minimal surrounding redness, no induration   Neurological:     Mental Status: She is alert and oriented to person, place, and time.      UC Treatments / Results  Labs (all labs ordered are listed, but only abnormal results are displayed) Labs Reviewed - No data to display  EKG   Radiology No results found.  Procedures Incision and Drainage  Date/Time: 02/27/2019 1:29 PM Performed by: Zigmund Gottron, NP Authorized by: Melynda Ripple, MD   Consent:     Consent obtained:  Verbal   Consent given by:  Patient   Risks discussed:  Bleeding, incomplete drainage, pain and infection   Alternatives discussed:  No treatment, observation, delayed treatment and referral Location:    Type:  Abscess   Size:  1   Location:  Lower extremity   Lower extremity location:  Leg   Leg location:  R upper leg Pre-procedure details:    Procedure prep: alcohol. Anesthesia (see MAR for exact dosages):    Anesthesia method:  Topical application   Topical anesthesia: freeze spray. Procedure type:    Complexity:  Simple Procedure details:    Incision types:  Stab incision   Scalpel blade:  11   Drainage:  Bloody and purulent   Drainage amount:  Scant   Wound treatment:  Wound left open   Packing materials:  None Post-procedure details:    Patient tolerance of procedure:  Tolerated well, no immediate complications   (including critical care time)  Medications Ordered in UC Medications  ibuprofen (ADVIL) tablet 800 mg (800 mg Oral Given 02/27/19 1302)    Initial Impression / Assessment and Plan / UC Course  I have reviewed the triage vital signs and the nursing notes.  Pertinent labs & imaging results that were available during my care of the patient were reviewed by me and considered in my medical decision making (see chart for details).     Fairly small abscess, without any active drainage.  Patient offered stab incision to which she agreed, to allow for some drainage. Wound care discussed. Antibiotics provided. Return precautions provided. Patient verbalized understanding and agreeable to plan.   Final Clinical Impressions(s) / UC Diagnoses   Final diagnoses:  Abscess     Discharge Instructions     Keep dressing in place for the next 24 hours. May remove tomorrow.  Then cleanse area daily with soap and water.  Keep covered to keep protected and collect drainage.   Warm compresses/ soaks 3-4 times a day to promote drainage.  Complete course  of antibiotics.  If symptoms worsen or do not improve in the next week to return to be seen or to follow up with your PCP.     ED Prescriptions    Medication Sig Dispense Auth. Provider   doxycycline (VIBRAMYCIN) 100 MG capsule Take 1 capsule (100 mg total) by mouth 2 (two) times daily. 20 capsule Augusto Gamble B, NP   ibuprofen (ADVIL) 800 MG tablet Take 1 tablet (800 mg total) by mouth 3 (three) times daily. 21 tablet Zigmund Gottron, NP     PDMP not reviewed this encounter.   Zigmund Gottron, NP 02/27/19 1331

## 2019-02-27 NOTE — Discharge Instructions (Signed)
Keep dressing in place for the next 24 hours. May remove tomorrow.  Then cleanse area daily with soap and water.  Keep covered to keep protected and collect drainage.   Warm compresses/ soaks 3-4 times a day to promote drainage.  Complete course of antibiotics.  If symptoms worsen or do not improve in the next week to return to be seen or to follow up with your PCP.

## 2019-03-04 MED FILL — KETOROLAC 10 MG TABLET: 10 | 4 days supply | Qty: 16 | Fill #0

## 2019-03-04 MED FILL — HYDROCODON-APAP 7.5-325: 7.5-325 | 2 days supply | Qty: 8 | Fill #0

## 2019-03-15 MED FILL — LISINOPRIL 10 MG TABS: 10 | 90 days supply | Qty: 90 | Fill #1

## 2019-04-13 MED FILL — AMLODIPINE BESYLATE 5 MG TA: 5 | 90 days supply | Qty: 90 | Fill #0

## 2019-04-13 MED FILL — ATORVASTATIN 20 MG TABLET: 20 | 90 days supply | Qty: 90 | Fill #1

## 2019-04-14 MED FILL — HYDROCODON-APAP 7.5-325: 7.5-325 | 2 days supply | Qty: 8 | Fill #0

## 2019-04-14 MED FILL — KETOROLAC 10 MG TABLET: 10 | 4 days supply | Qty: 16 | Fill #0

## 2019-04-20 MED FILL — DEXAMETHASONE 2 MG TABLET: 2 | 2 days supply | Qty: 6 | Fill #0

## 2019-04-20 MED FILL — LIDOCAINE 2% VISCOUS SOLN: 2 | 2 days supply | Qty: 100 | Fill #0

## 2019-04-22 MED FILL — oxyCODONE HCL 5 MG TABS: 5 | 2 days supply | Qty: 6 | Fill #0

## 2019-04-22 MED FILL — DEXAMETHASONE 0.5 MG/5 ML L: 0.5 | 10 days supply | Qty: 300 | Fill #0

## 2019-04-27 MED FILL — LIDO MYL DPH NYS DEC H2O: 7 days supply | Qty: 300 | Fill #0

## 2019-05-28 DIAGNOSIS — M79645 Pain in left finger(s): Secondary | ICD-10-CM | POA: Diagnosis not present

## 2019-05-28 DIAGNOSIS — M79644 Pain in right finger(s): Secondary | ICD-10-CM | POA: Diagnosis not present

## 2019-06-11 ENCOUNTER — Other Ambulatory Visit: Payer: Self-pay

## 2019-06-11 ENCOUNTER — Encounter: Payer: Self-pay | Admitting: Family Medicine

## 2019-06-11 ENCOUNTER — Other Ambulatory Visit: Payer: Self-pay | Admitting: Cardiology

## 2019-06-11 ENCOUNTER — Other Ambulatory Visit: Payer: Self-pay | Admitting: Family Medicine

## 2019-06-11 ENCOUNTER — Other Ambulatory Visit: Payer: Self-pay | Admitting: Emergency Medicine

## 2019-06-11 DIAGNOSIS — I208 Other forms of angina pectoris: Secondary | ICD-10-CM

## 2019-06-11 DIAGNOSIS — T07XXXA Unspecified multiple injuries, initial encounter: Secondary | ICD-10-CM

## 2019-06-11 DIAGNOSIS — I10 Essential (primary) hypertension: Secondary | ICD-10-CM

## 2019-06-11 MED ORDER — ATORVASTATIN CALCIUM 20 MG PO TABS: 20.0000 mg | ORAL_TABLET | Freq: Every day | ORAL | 0 refills | Status: DC

## 2019-06-11 MED ORDER — LISINOPRIL 10 MG PO TABS: 10.0000 mg | ORAL_TABLET | Freq: Every day | ORAL | 0 refills | Status: DC

## 2019-06-11 MED ORDER — AMLODIPINE BESYLATE 5 MG PO TABS: 5.0000 mg | ORAL_TABLET | Freq: Every day | ORAL | 0 refills | Status: DC

## 2019-06-11 MED ORDER — NITROGLYCERIN 0.4 MG SL SUBL: 0.4000 mg | SUBLINGUAL_TABLET | SUBLINGUAL | 1 refills | Status: DC | PRN

## 2019-06-11 MED FILL — NITROGLYCERIN 0.4 MG TAB SL: 0.4 | 7 days supply | Qty: 25 | Fill #0

## 2019-06-11 MED FILL — LISINOPRIL 10 MG TABS: 10 | 90 days supply | Qty: 90 | Fill #0

## 2019-06-11 MED FILL — KETOROLAC 10 MG TABLET: 10 | 5 days supply | Qty: 20 | Fill #0

## 2019-06-11 NOTE — Telephone Encounter (Signed)
Patient is requesting a refill of the following medications: Requested Prescriptions   Pending Prescriptions Disp Refills   HYDROcodone-acetaminophen (NORCO) 5-325 MG tablet 12 tablet 0    Sig: Take 1 tablet by mouth every 6 (six) hours as needed.    Date of patient request: 06/11/2019 Last office visit: 02/17/2019 Date of last refill: 02/17/2019 Last refill amount: 12 tab

## 2019-06-12 MED ORDER — HYDROCODONE-ACETAMINOPHEN 5-325 MG PO TABS: 1.0000 | ORAL_TABLET | Freq: Four times a day (QID) | ORAL | 0 refills | Status: DC | PRN

## 2019-06-24 NOTE — Telephone Encounter (Signed)
Can ya'll schedule a F/U per WK/thx dmf

## 2019-06-24 NOTE — Telephone Encounter (Signed)
Pt has appt scheduled.

## 2019-07-03 ENCOUNTER — Other Ambulatory Visit: Payer: Self-pay | Admitting: Family Medicine

## 2019-07-03 ENCOUNTER — Other Ambulatory Visit: Payer: Self-pay | Admitting: Emergency Medicine

## 2019-07-03 DIAGNOSIS — I10 Essential (primary) hypertension: Secondary | ICD-10-CM

## 2019-07-03 DIAGNOSIS — T07XXXA Unspecified multiple injuries, initial encounter: Secondary | ICD-10-CM

## 2019-07-03 DIAGNOSIS — I208 Other forms of angina pectoris: Secondary | ICD-10-CM

## 2019-07-05 MED ORDER — AMLODIPINE BESYLATE 5 MG PO TABS: 5.0000 mg | ORAL_TABLET | Freq: Every day | ORAL | 0 refills | Status: DC

## 2019-07-05 MED ORDER — ATORVASTATIN CALCIUM 20 MG PO TABS: 20.0000 mg | ORAL_TABLET | Freq: Every day | ORAL | 0 refills | Status: DC

## 2019-07-05 MED ORDER — LISINOPRIL 10 MG PO TABS: 10.0000 mg | ORAL_TABLET | Freq: Every day | ORAL | 0 refills | Status: DC

## 2019-07-05 MED FILL — ATORVASTATIN 20 MG TABLET: 20 | 90 days supply | Qty: 90 | Fill #0

## 2019-07-05 MED FILL — AMLODIPINE BESYLATE 5 MG TA: 5 | 90 days supply | Qty: 90 | Fill #0

## 2019-07-05 NOTE — Telephone Encounter (Signed)
Last OV 10/08/18 Last fill for all 06/11/19  #90/0 Next OV 09/13/19

## 2019-07-06 NOTE — Telephone Encounter (Signed)
Patient is requesting a refill of the following medications: Requested Prescriptions   Pending Prescriptions Disp Refills  . HYDROcodone-acetaminophen (NORCO) 5-325 MG tablet 12 tablet 0    Sig: Take 1 tablet by mouth every 6 (six) hours as needed.    Date of patient request: 07/03/2019 Last office visit: 02/17/2019 Date of last refill: 06/12/2019 Last refill amount: 12 tablets  Follow up time period per chart:

## 2019-07-13 DIAGNOSIS — H5213 Myopia, bilateral: Secondary | ICD-10-CM | POA: Diagnosis not present

## 2019-07-13 DIAGNOSIS — H40013 Open angle with borderline findings, low risk, bilateral: Secondary | ICD-10-CM | POA: Diagnosis not present

## 2019-07-13 DIAGNOSIS — H524 Presbyopia: Secondary | ICD-10-CM | POA: Diagnosis not present

## 2019-07-16 DIAGNOSIS — M542 Cervicalgia: Secondary | ICD-10-CM | POA: Diagnosis not present

## 2019-07-29 IMAGING — CT CT ABD-PELV W/ CM
2 of 5 series · 16 of 46 positions shown, 18 images · IV contrast (omnipaque)
Comparison: CT abdomen and pelvis November 02, 2017 and CT abdomen
and pelvis January 30, 2016

CLINICAL DATA: LEFT lower quadrant pain. History of diverticulitis,
partial colectomy and hysterectomy.

EXAM:
CT ABDOMEN AND PELVIS WITH CONTRAST
TECHNIQUE: Multidetector CT imaging of the abdomen and pelvis was performed
using the standard protocol following bolus administration of
intravenous contrast.
CONTRAST:  100mL OMNIPAQUE IOHEXOL 300 MG/ML  SOLN

[Series 3: abd/ pelvis 5.0 i30f 2 · axial · 0.95mm/px · z∈[+876,+1311]mm · 13 of 97 slices shown, 15 images]
[im 5/97  soft-tissue]
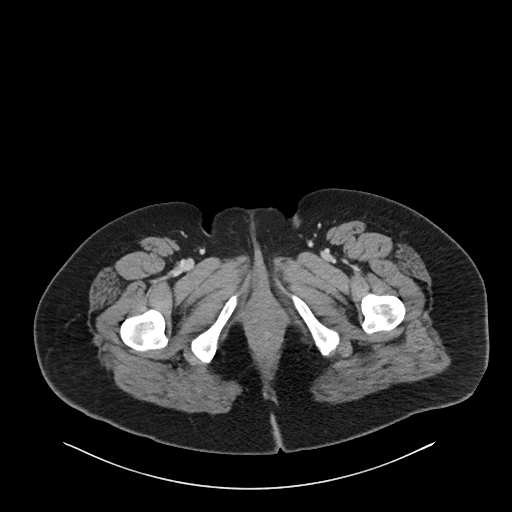
[im 5/97  bone]
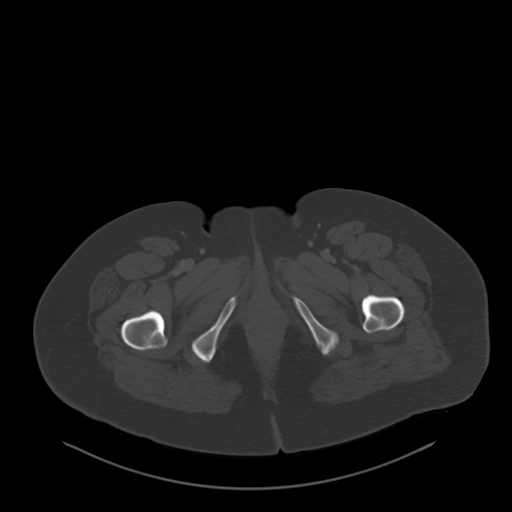
[im 15/97  soft-tissue]
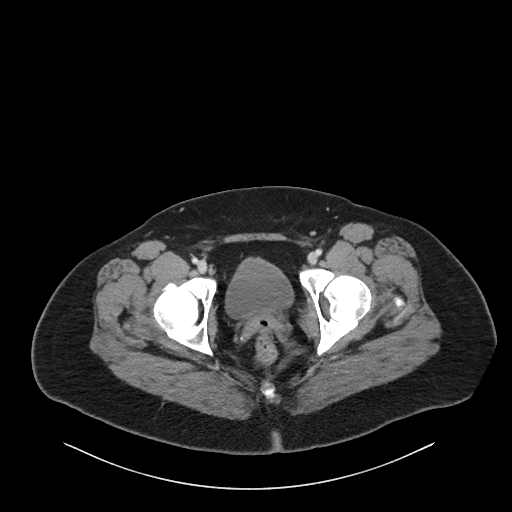
[im 20/97  soft-tissue]
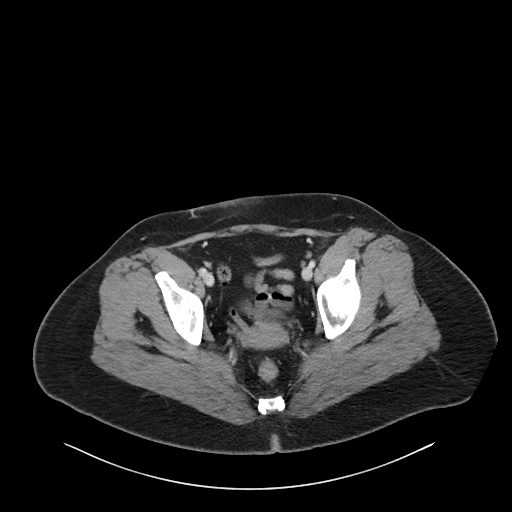
[im 29/97  soft-tissue]
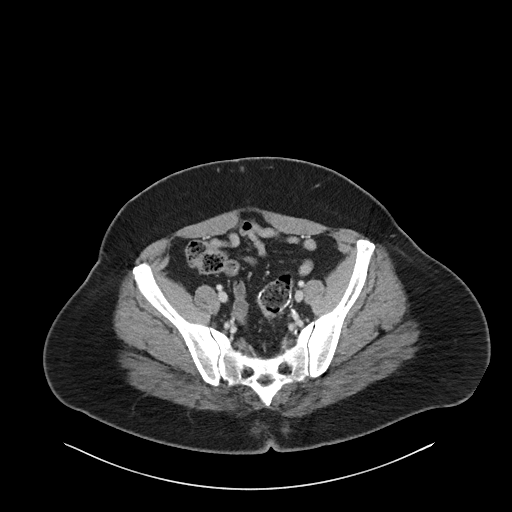
[im 34/97  soft-tissue]
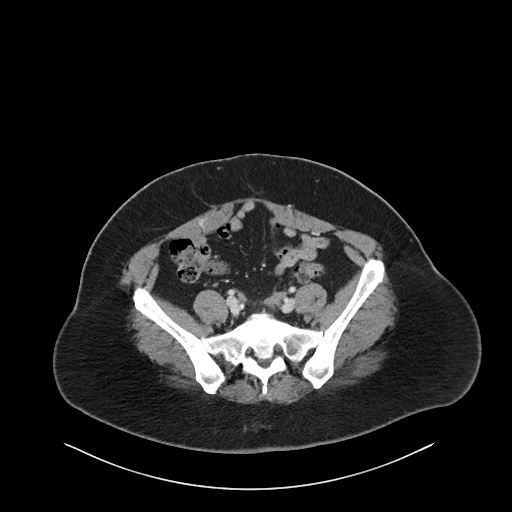
[im 44/97  soft-tissue]
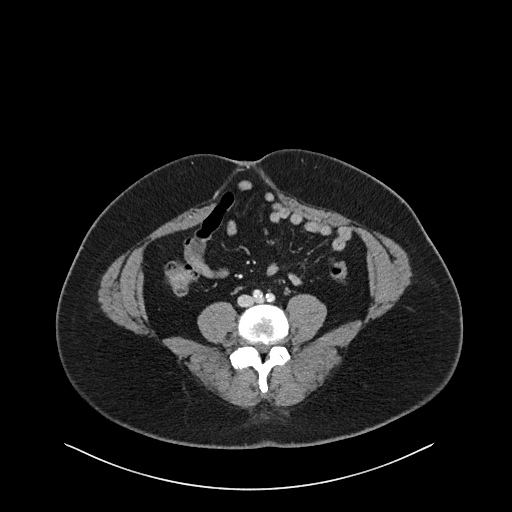
[im 49/97  soft-tissue]
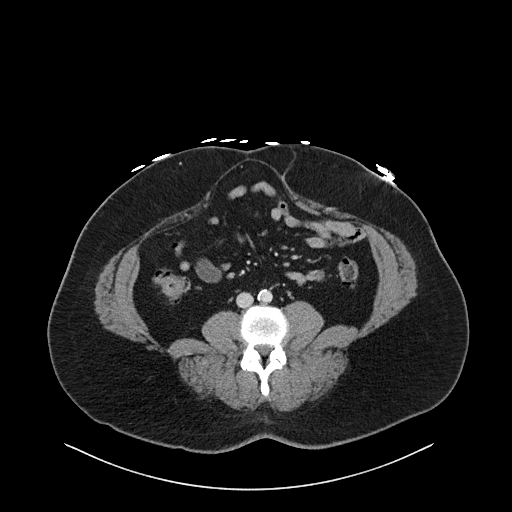
[im 53/97  soft-tissue]
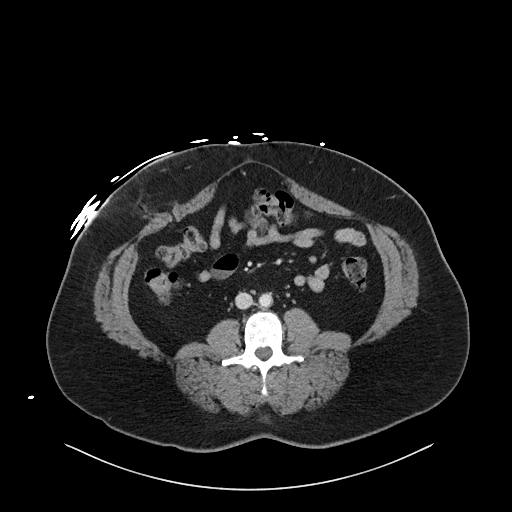
[im 63/97  soft-tissue]
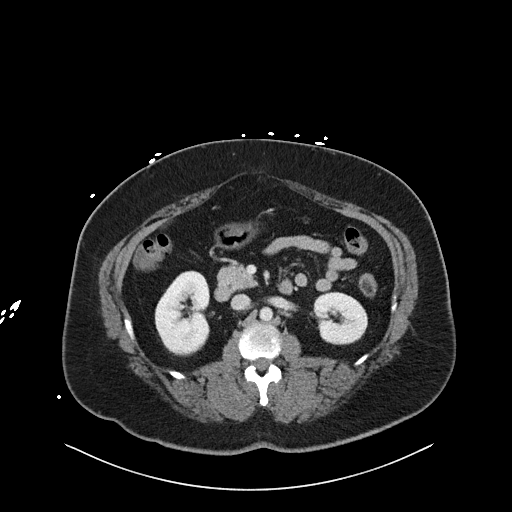
[im 63/97  bone]
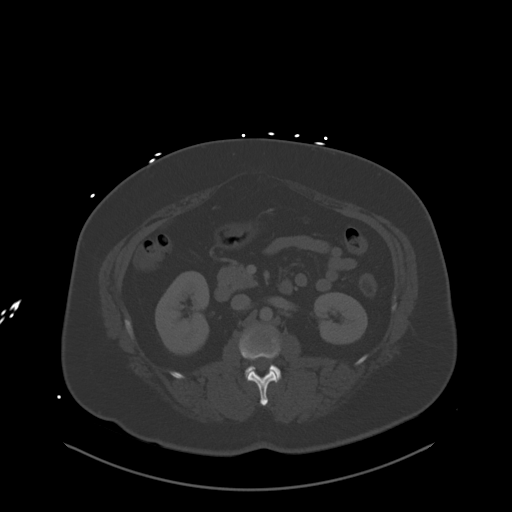
[im 68/97  soft-tissue]
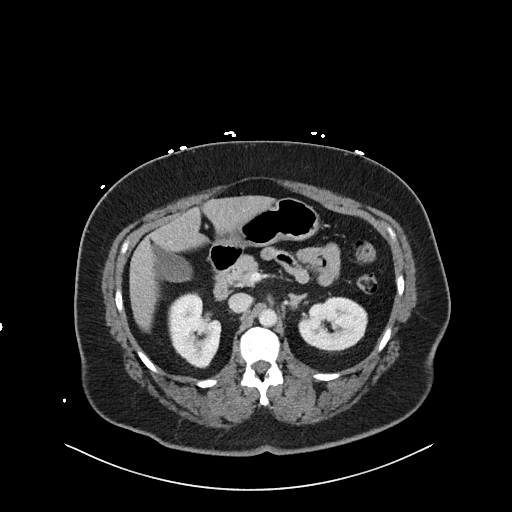
[im 77/97  soft-tissue]
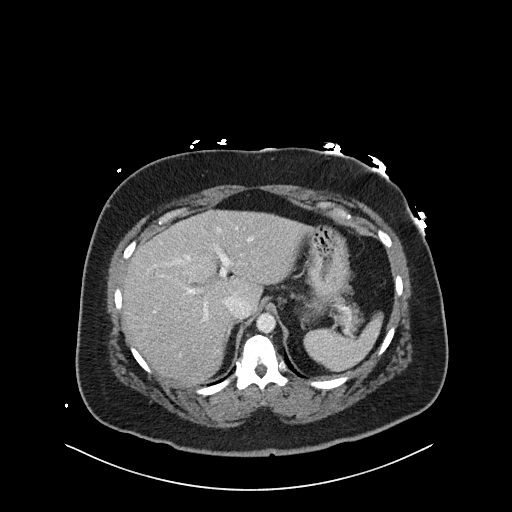
[im 82/97  soft-tissue]
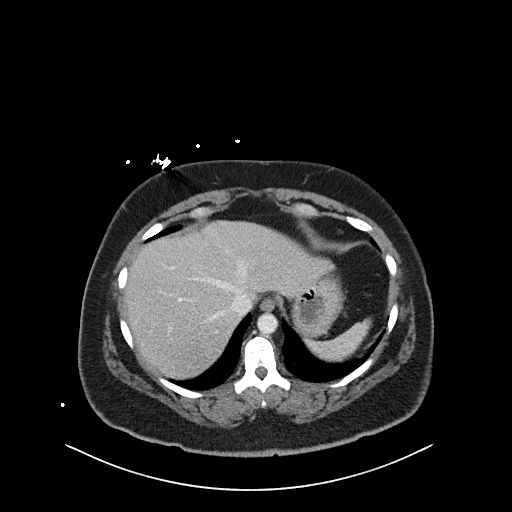
[im 92/97  soft-tissue]
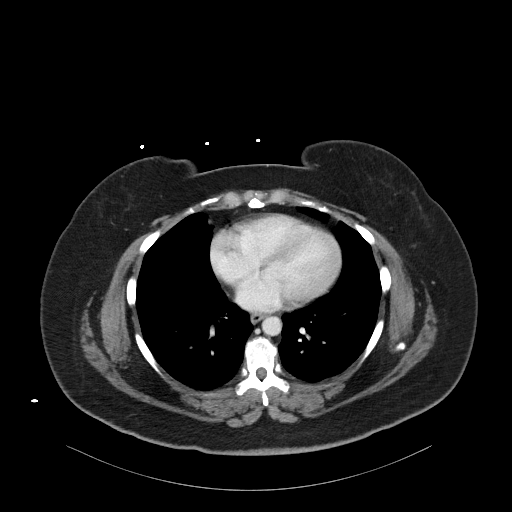

[Series 6: coronal soft tissue · coronal · 0.94mm/px · 3 of 110 slices shown]
[im 37/110  soft-tissue]
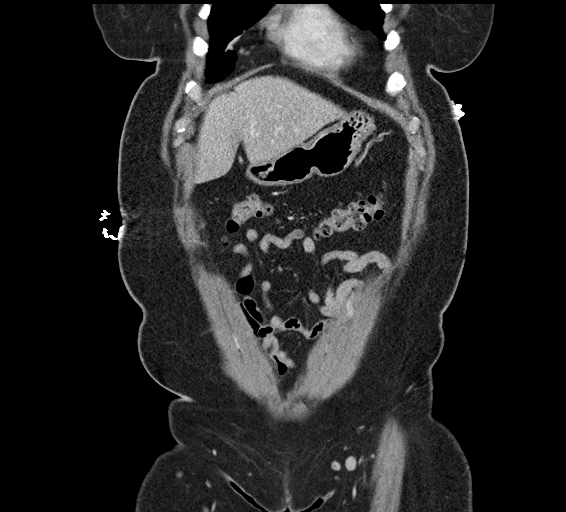
[im 49/110  soft-tissue]
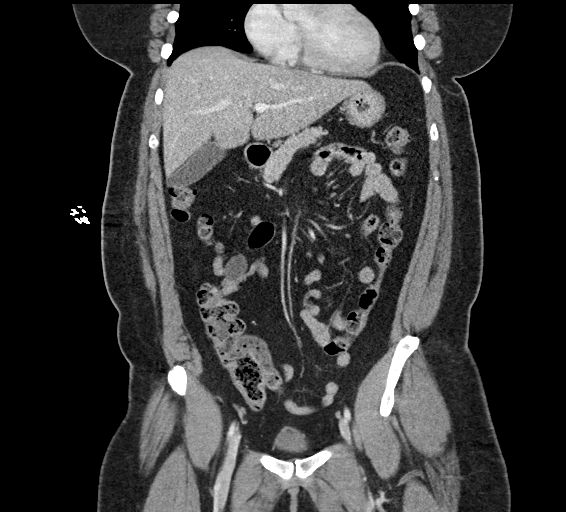
[im 61/110  soft-tissue]
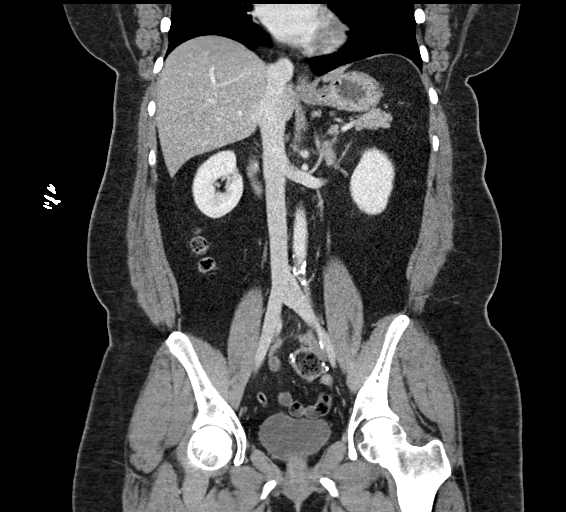

[16 of 46 positions shown; findings below may reference images not displayed]

FINDINGS: LOWER CHEST: Dependent atelectasis. Included heart size is normal.
No pericardial effusion.

HEPATOBILIARY: Liver and gallbladder are normal.

PANCREAS: Normal.

SPLEEN: Normal.

ADRENALS/URINARY TRACT: Kidneys are orthotopic, demonstrating
symmetric enhancement. No nephrolithiasis, hydronephrosis or solid
renal masses. The unopacified ureters are normal in course and
caliber. Delayed imaging through the kidneys demonstrates symmetric
prompt contrast excretion within the proximal urinary collecting
system. Urinary bladder is partially distended and unremarkable.
Three year stability of 15 mm LEFT adrenal nodule.

STOMACH/BOWEL: Rectosigmoid bowel anastomosis. Mild colonic
diverticulosis. Small and large bowel normal course and caliber.
Terminal ileal surgical anastomosis. Normal appendix.

VASCULAR/LYMPHATIC: Aortoiliac vessels are normal in course and
caliber. Mild calcific atherosclerosis. No lymphadenopathy by CT
size criteria.

REPRODUCTIVE: Status post hysterectomy.

OTHER: No intraperitoneal free fluid or free air.

MUSCULOSKELETAL: Nonacute. Rectus abdominus diastasis. Anterior
abdominal wall scarring.
IMPRESSION: 1. Colonic diverticulosis without acute diverticulitis nor acute
intra-abdominal/pelvic process.
2. Status post partial colectomy and small bowel surgery.

Aortic Atherosclerosis ([id number]).

## 2019-09-06 ENCOUNTER — Other Ambulatory Visit: Payer: Self-pay

## 2019-09-06 ENCOUNTER — Other Ambulatory Visit: Payer: Self-pay | Admitting: Family Medicine

## 2019-09-06 ENCOUNTER — Other Ambulatory Visit: Payer: Self-pay | Admitting: Emergency Medicine

## 2019-09-06 DIAGNOSIS — I208 Other forms of angina pectoris: Secondary | ICD-10-CM

## 2019-09-06 DIAGNOSIS — T07XXXA Unspecified multiple injuries, initial encounter: Secondary | ICD-10-CM

## 2019-09-06 DIAGNOSIS — I10 Essential (primary) hypertension: Secondary | ICD-10-CM

## 2019-09-06 MED ORDER — NITROGLYCERIN 0.4 MG SL SUBL: 0.4000 mg | SUBLINGUAL_TABLET | SUBLINGUAL | 1 refills | Status: DC | PRN

## 2019-09-07 MED ORDER — ATORVASTATIN CALCIUM 20 MG PO TABS: 20.0000 mg | ORAL_TABLET | Freq: Every day | ORAL | 0 refills | Status: DC

## 2019-09-07 MED ORDER — AMLODIPINE BESYLATE 5 MG PO TABS: 5.0000 mg | ORAL_TABLET | Freq: Every day | ORAL | 0 refills | Status: DC

## 2019-09-07 MED ORDER — LISINOPRIL 10 MG PO TABS: 10.0000 mg | ORAL_TABLET | Freq: Every day | ORAL | 0 refills | Status: DC

## 2019-09-07 NOTE — Telephone Encounter (Signed)
Patient is requesting a refill of the following medications: Requested Prescriptions   Pending Prescriptions Disp Refills   HYDROcodone-acetaminophen (NORCO) 5-325 MG tablet 12 tablet 0    Sig: Take 1 tablet by mouth every 6 (six) hours as needed.    Date of patient request: 09/06/19 Last office visit: 02/17/19 Date of last refill: 06/12/19 Last refill amount: 12 Follow up time period per chart:

## 2019-09-07 NOTE — Telephone Encounter (Signed)
Last OV 10/08/18 Last fill 07/05/19  #90/0 Last fill fpr Lis 06/11/19  #90/0 Ok to fill?

## 2019-09-09 ENCOUNTER — Ambulatory Visit (HOSPITAL_COMMUNITY)
Admission: EM | Admit: 2019-09-09 | Discharge: 2019-09-09 | Disposition: A | Discharge status: Home / Self Care | Payer: 59 | Attending: Physician Assistant | Admitting: Physician Assistant

## 2019-09-09 ENCOUNTER — Other Ambulatory Visit: Payer: Self-pay

## 2019-09-09 ENCOUNTER — Encounter (HOSPITAL_COMMUNITY): Payer: Self-pay

## 2019-09-09 DIAGNOSIS — R109 Unspecified abdominal pain: Secondary | ICD-10-CM | POA: Diagnosis not present

## 2019-09-09 DIAGNOSIS — K5732 Diverticulitis of large intestine without perforation or abscess without bleeding: Secondary | ICD-10-CM | POA: Diagnosis present

## 2019-09-09 LAB — POCT URINALYSIS DIP (DEVICE)
Bilirubin Urine: NEGATIVE
Glucose, UA: NEGATIVE mg/dL
Hgb urine dipstick: NEGATIVE
Ketones, ur: NEGATIVE mg/dL
Leukocytes,Ua: NEGATIVE
Nitrite: NEGATIVE
Protein, ur: NEGATIVE mg/dL
Specific Gravity, Urine: >=1.03 (ref 1.005–1.030)
Urobilinogen, UA: 0.2 mg/dL (ref 0.0–1.0)
pH: 7 (ref 5.0–8.0)

## 2019-09-09 MED ORDER — METRONIDAZOLE 500 MG PO TABS: 500.0000 mg | ORAL_TABLET | Freq: Three times a day (TID) | ORAL | 0 refills | Status: AC

## 2019-09-09 MED ORDER — METRONIDAZOLE 500 MG PO TABS: 500.0000 mg | ORAL_TABLET | Freq: Three times a day (TID) | ORAL | 0 refills | Status: DC

## 2019-09-09 MED ORDER — CIPROFLOXACIN HCL 500 MG PO TABS: 500.0000 mg | ORAL_TABLET | Freq: Two times a day (BID) | ORAL | 0 refills | Status: DC

## 2019-09-09 MED ORDER — ACETAMINOPHEN 325 MG PO TABS: 650.0000 mg | ORAL_TABLET | Freq: Four times a day (QID) | ORAL | 0 refills | Status: DC | PRN

## 2019-09-09 MED ORDER — CIPROFLOXACIN HCL 500 MG PO TABS: 500.0000 mg | ORAL_TABLET | Freq: Two times a day (BID) | ORAL | 0 refills | Status: AC

## 2019-09-09 NOTE — ED Triage Notes (Signed)
Pt presents with left side flank pain since yesterday; pt states she has had some urinary frequency at night time and has Hx of diverticulitis.

## 2019-09-09 NOTE — Discharge Instructions (Addendum)
Take the medications as prescribed - ciprofloxacin 2 times a day - metronidazole 3 times a day -tylenol as needed for pain  We did send a urine culture, if we need to change anything we will call  Follow up with your PCP on Monday  If severe pain, high fever, vomiting, severe diarrhea, bloody diarrhea, go to the Emergency Department

## 2019-09-09 NOTE — ED Provider Notes (Signed)
[place]    CSN: [id number] Arrival date & time: 09/09/19  1658      History   Chief Complaint Chief Complaint  Patient presents with  . Flank Pain    HPI [name] is a 49 y.o. female.   Patient with history of diverticulitis presents for left-sided flank and abdominal pain that started yesterday.  Pain is been constant and not coming and going.  She reports large amount of yellow mucousy appearing diarrhea yesterday.  Denies any blood in the stool.  Denies nausea vomiting.  She denies painful urination or urgency but does believe she has had some frequency at night.  Denies fever, chills.  Denies any skin rashes.  She reports she had several episodes of diverticulitis in the past requiring a colectomy.  She reports this is somewhat similar for the location of pain when she has had diverticulitis before.  Denies history of kidney stone       Past Medical History:  Diagnosis Date  . Abnormal Pap smear 2000   [facility] Biopsy  . Allergy   . Arthritis    hands  . Back pain   . Blood transfusion without reported diagnosis 2005  . Breast fibroadenoma   . Diverticulitis of colon with perforation 05/20/2014  . GERD (gastroesophageal reflux disease)   . H/O metrorrhagia 12/2006  . H/O myomectomy 07-2006   robotic  . H/O sinusitis   . Headache(784.0)   . Heart murmur   . History of conization of cervix 2000  . History of ectopic pregnancy 2005   r salpyngectomy  . History of hysterectomy, supracervical 08-2008   robotic  . History of syphilis   . History of syphilis   . Hx of adenomatous polyp of colon 12/31/2017  . Hx of herpes simplex type 2 infection   . Hyperlipidemia   . Hypertension   . Ileostomy present ([place]) 10/26/2014  . Intra-abdominal abscess ([place]) 07/21/2014  . PMS (premenstrual syndrome)     Patient Active Problem List   Diagnosis Date Noted  . Elevated glucose 10/08/2018  . Elevated LDL cholesterol level 10/08/2018  . Healthcare maintenance  10/08/2018  . Need for immunization against influenza 10/08/2018  . Obesity (BMI 30-39.9) 03/29/2018  . Acute chest pain 03/27/2018  . Nonspecific abnormal electrocardiogram (ECG) (EKG) 03/27/2018  . Atypical chest pain 03/27/2018  . Hand pain, right 03/25/2018  . Dyslipidemia 08/15/2017  . History of hysterectomy, supracervical 02/14/2017  . Trigger ring finger of right hand 08/07/2015  . Primary osteoarthritis of first carpometacarpal joint of right hand 08/07/2015  . Diverticulitis of sigmoid colon s/p colectomy 06/30/2014 07/26/2014  . Tachycardia 02/04/2014  . Hx of adenomatous polyp of colon 12/23/2013  . Essential hypertension 11/02/2013  . Former smoker 11/26/2008    Past Surgical History:  Procedure Laterality Date  . ABDOMINAL HYSTERECTOMY    . BREAST LUMPECTOMY  10/11   rt-neg  . BREAST SURGERY  2004   fibroademoma  . CERVICAL CONE BIOPSY  2000  . COLON RESECTION N/A 07/06/2014   Procedure: LAPAROSCOPIC LOOP ILEOSTOMY WITH PLACEMENT OF PELVIC DRAIN;  Surgeon: Donnie Mesa, MD;  Location: [place];  Service: General;  Laterality: N/A;  . COLON SURGERY     diverticulitis   . COLONOSCOPY    . DILATION AND CURETTAGE OF UTERUS  2009  . DIVERTING ILEOSTOMY Right 06/2014  . ECTOPIC PREGNANCY SURGERY  2005   R salpyngectomy  . ILEOSTOMY N/A 10/26/2014   Procedure: LOOP ILEOSTOMY REVERSAL;  Surgeon: Donnie Mesa, MD;  Location: [place];  Service: General;  Laterality: N/A;  . LAPAROSCOPIC SIGMOID COLECTOMY N/A 06/30/2014   Procedure: LAPAROSCOPIC ASSISTED SIGMOID COLECTOMY;  Surgeon: Donnie Mesa, MD;  Location: [place];  Service: General;  Laterality: N/A;  . MYOMECTOMY  2008   robotic  . POLYPECTOMY  2009  . ROBOTIC ASSISTED LAP VAGINAL HYSTERECTOMY  2010   supracervical  . SALPINGECTOMY  2005  . TRIGGER FINGER RELEASE Right 09/28/2012   Procedure: RELEASE TRIGGER FINGER/A-1 PULLEY RIGHT THUMB;  Surgeon: Jolyn Nap, MD;  Location: [place];  Service:  Orthopedics;  Laterality: Right;  . ULNAR NERVE TRANSPOSITION Right 12/01/2018   Procedure: ULNAR NERVE DECOMPRESSION;  Surgeon: Daryll Brod, MD;  Location: [place];  Service: Orthopedics;  Laterality: Right;  AXILLARY    OB History    Gravida  4   Para  2   Term  2   Preterm      AB  2   Living  2     SAB  0   TAB  1   Ectopic  1   Multiple  0   Live Births               Home Medications    Prior to Admission medications   Medication Sig Start Date End Date Taking? Authorizing Provider  acetaminophen (TYLENOL) 325 MG tablet Take 2 tablets (650 mg total) by mouth every 6 (six) hours as needed. 09/09/19   Shaquelle Hernon, Marguerita Beards, PA-C  amLODipine (NORVASC) 5 MG tablet Take 1 tablet (5 mg total) by mouth daily. 09/07/19   Libby Maw, MD  atorvastatin (LIPITOR) 20 MG tablet Take 1 tablet (20 mg total) by mouth daily. 09/07/19   Libby Maw, MD  Black Cohosh (REMIFEMIN) 20 MG TABS Take 40 mg by mouth daily.    [provider]  ciprofloxacin (CIPRO) 500 MG tablet Take 1 tablet (500 mg total) by mouth 2 (two) times daily for 7 days. 09/09/19 09/16/19  Gurtha Picker, Marguerita Beards, PA-C  doxycycline (VIBRAMYCIN) 100 MG capsule Take 1 capsule (100 mg total) by mouth 2 (two) times daily. 02/27/19   Zigmund Gottron, NP  HYDROcodone-acetaminophen (NORCO) 5-325 MG tablet Take 1 tablet by mouth every 6 (six) hours as needed. 06/12/19   Horald Pollen, MD  ibuprofen (ADVIL) 800 MG tablet Take 1 tablet (800 mg total) by mouth 3 (three) times daily. 02/27/19   Augusto Gamble B, NP  lisinopril (ZESTRIL) 10 MG tablet Take 1 tablet (10 mg total) by mouth daily. 09/07/19   Libby Maw, MD  metroNIDAZOLE (FLAGYL) 500 MG tablet Take 1 tablet (500 mg total) by mouth 3 (three) times daily for 7 days. 09/09/19 09/16/19  Reeda Soohoo, Marguerita Beards, PA-C  Multiple Vitamins-Minerals (HAIR/SKIN/NAILS/BIOTIN PO) Take 1 tablet by mouth daily.    [provider]    nitroGLYCERIN (NITROSTAT) 0.4 MG SL tablet Place 1 tablet (0.4 mg total) under the tongue every 5 (five) minutes as needed for up to 25 days for chest pain. 09/06/19 10/01/19  Adrian Prows, MD    Family History Family History  Problem Relation Age of Onset  . Hypertension Paternal Grandmother   . Diabetes Paternal Grandmother   . Hypertension Maternal Grandmother   . Sarcoidosis Mother   . Hypertension Mother   . Hyperlipidemia Mother   . Thyroid disease Maternal Aunt   . Mental illness Cousin   . Fibroids Sister   .  Colon cancer Neg Hx   . Colon polyps Neg Hx   . Esophageal cancer Neg Hx   . Rectal cancer Neg Hx   . Stomach cancer Neg Hx     Social History Social History   Tobacco Use  . Smoking status: Former Smoker    Packs/day: 1.00    Years: 20.00    Pack years: 20.00    Types: Cigarettes    Quit date: 06/13/2011    Years since quitting: 8.2  . Smokeless tobacco: Never Used  Vaping Use  . Vaping Use: Never used  Substance Use Topics  . Alcohol use: Not Currently  . Drug use: No     Allergies   Keflex [cephalexin] and Penicillins   Review of Systems Review of Systems   Physical Exam Triage Vital Signs ED Triage Vitals  Enc Vitals Group     BP 09/09/19 1832 122/82     Pulse Rate 09/09/19 1832 91     Resp 09/09/19 1832 18     Temp --      Temp Source 09/09/19 1832 Oral     SpO2 09/09/19 1832 100 %     Weight --      Height --      Head Circumference --      Peak Flow --      Pain Score 09/09/19 1830 8     Pain Loc --      Pain Edu? --      Excl. in [place]? --    No data found.  Updated Vital Signs BP 122/82 (BP Location: Right Arm)   Pulse 91   Temp 98.1 F (36.7 C) (Oral)   Resp 18   SpO2 100%   Visual Acuity Right Eye Distance:   Left Eye Distance:   Bilateral Distance:    Right Eye Near:   Left Eye Near:    Bilateral Near:     Physical Exam Vitals and nursing note reviewed.  Constitutional:      General: She is not in acute  distress.    Appearance: She is well-developed.  HENT:     Head: Normocephalic and atraumatic.  Eyes:     Conjunctiva/sclera: Conjunctivae normal.  Cardiovascular:     Rate and Rhythm: Normal rate and regular rhythm.     Heart sounds: No murmur heard.   Pulmonary:     Effort: Pulmonary effort is normal. No respiratory distress.     Breath sounds: Normal breath sounds.  Abdominal:     General: Abdomen is flat. Bowel sounds are normal. There is no distension.     Palpations: Abdomen is soft.     Tenderness: There is abdominal tenderness in the left lower quadrant. There is no right CVA tenderness, left CVA tenderness, guarding or rebound.     Hernia: No hernia is present.     Comments: Abdomen soft without rigidity  Musculoskeletal:     Cervical back: Neck supple.     Right lower leg: No edema.     Left lower leg: No edema.  Skin:    General: Skin is warm and dry.     Findings: No rash.  Neurological:     Mental Status: She is alert.      UC Treatments / Results  Labs (all labs ordered are listed, but only abnormal results are displayed) Labs Reviewed  URINE CULTURE  POCT URINALYSIS DIP (DEVICE)    EKG   Radiology No results found.  Procedures Procedures (including  critical care time)  Medications Ordered in UC Medications - No data to display  Initial Impression / Assessment and Plan / UC Course  I have reviewed the triage vital signs and the nursing notes.  Pertinent labs & imaging results that were available during my care of the patient were reviewed by me and considered in my medical decision making (see chart for details).     #Diverticulitis Patient is 49 year old with history of diverticulitis, colectomy in 2016 however November 2020 colonoscopy showed diverticula in sigmoid colon.  UA completely normal here.  Given abdominal pain and change in stool, we will treat her as a diverticulitis.  She is afebrile, nontachycardic and while there is tenderness  on exam, no peritoneal signs currently.  We will start her on ciprofloxacin and metronidazole.  Urine culture was sent however.  Tylenol as needed for pain.  Patient has follow-up on Monday with her primary care, feel this is a perfect interval for reevaluation.  Strict emergency department precautions were discussed.  Patient verbalized understanding plan of care. Final Clinical Impressions(s) / UC Diagnoses   Final diagnoses:  Diverticulitis of colon     Discharge Instructions     Take the medications as prescribed - ciprofloxacin 2 times a day - metronidazole 3 times a day -tylenol as needed for pain  We did send a urine culture, if we need to change anything we will call  Follow up with your PCP on Monday  If severe pain, high fever, vomiting, severe diarrhea, bloody diarrhea, go to the Emergency Department      ED Prescriptions    Medication Sig Dispense Auth. Provider   ciprofloxacin (CIPRO) 500 MG tablet  (Status: Discontinued) Take 1 tablet (500 mg total) by mouth 2 (two) times daily for 7 days. 14 tablet Calven Gilkes, Marguerita Beards, PA-C   metroNIDAZOLE (FLAGYL) 500 MG tablet  (Status: Discontinued) Take 1 tablet (500 mg total) by mouth 3 (three) times daily for 7 days. 21 tablet Lateria Alderman, Marguerita Beards, PA-C   acetaminophen (TYLENOL) 325 MG tablet  (Status: Discontinued) Take 2 tablets (650 mg total) by mouth every 6 (six) hours as needed. 30 tablet Lareen Mullings, Marguerita Beards, PA-C   ciprofloxacin (CIPRO) 500 MG tablet Take 1 tablet (500 mg total) by mouth 2 (two) times daily for 7 days. 14 tablet Calab Sachse, Marguerita Beards, PA-C   metroNIDAZOLE (FLAGYL) 500 MG tablet Take 1 tablet (500 mg total) by mouth 3 (three) times daily for 7 days. 21 tablet Sherrilynn Gudgel, Marguerita Beards, PA-C   acetaminophen (TYLENOL) 325 MG tablet Take 2 tablets (650 mg total) by mouth every 6 (six) hours as needed. 30 tablet Marisol Giambra, Marguerita Beards, PA-C     PDMP not reviewed this encounter.   Purnell Shoemaker, PA-C 09/09/19 2353

## 2019-09-10 LAB — URINE CULTURE

## 2019-09-13 ENCOUNTER — Encounter: Payer: 59 | Admitting: Family Medicine

## 2019-10-04 ENCOUNTER — Other Ambulatory Visit: Payer: Self-pay

## 2019-10-04 ENCOUNTER — Encounter: Payer: Self-pay | Admitting: Family Medicine

## 2019-10-04 ENCOUNTER — Ambulatory Visit (INDEPENDENT_AMBULATORY_CARE_PROVIDER_SITE_OTHER): Payer: 59 | Admitting: Family Medicine

## 2019-10-04 VITALS — BP 126/78 | HR 94 | Temp 97.1°F | Ht 64.0 in | Wt 221.3 lb

## 2019-10-04 DIAGNOSIS — I10 Essential (primary) hypertension: Secondary | ICD-10-CM | POA: Diagnosis not present

## 2019-10-04 DIAGNOSIS — M79605 Pain in left leg: Secondary | ICD-10-CM | POA: Diagnosis not present

## 2019-10-04 DIAGNOSIS — R7309 Other abnormal glucose: Secondary | ICD-10-CM | POA: Diagnosis not present

## 2019-10-04 DIAGNOSIS — Z0000 Encounter for general adult medical examination without abnormal findings: Secondary | ICD-10-CM | POA: Diagnosis not present

## 2019-10-04 DIAGNOSIS — E7800 Pure hypercholesterolemia, unspecified: Secondary | ICD-10-CM

## 2019-10-04 LAB — COMPREHENSIVE METABOLIC PANEL
ALT: 22 U/L (ref 0–35)
AST: 18 U/L (ref 0–37)
Albumin: 4.6 g/dL (ref 3.5–5.2)
Alkaline Phosphatase: 58 U/L (ref 39–117)
BUN: 13 mg/dL (ref 6–23)
CO2: 30 mEq/L (ref 19–32)
Calcium: 10.1 mg/dL (ref 8.4–10.5)
Chloride: 102 mEq/L (ref 96–112)
Creatinine, Ser: 0.9 mg/dL (ref 0.40–1.20)
GFR: 80.02 mL/min (ref >60.00)
Glucose, Bld: 108 mg/dL — ABNORMAL HIGH (ref 70–99)
Potassium: 4.2 mEq/L (ref 3.5–5.1)
Sodium: 141 mEq/L (ref 135–145)
Total Bilirubin: 0.5 mg/dL (ref 0.2–1.2)
Total Protein: 7.3 g/dL (ref 6.0–8.3)

## 2019-10-04 LAB — URINALYSIS, ROUTINE W REFLEX MICROSCOPIC
Bilirubin Urine: NEGATIVE
Hgb urine dipstick: NEGATIVE
Ketones, ur: NEGATIVE
Leukocytes,Ua: NEGATIVE
Nitrite: NEGATIVE
RBC / HPF: NONE SEEN (ref 0–?)
Specific Gravity, Urine: 1.02 (ref 1.000–1.030)
Total Protein, Urine: NEGATIVE
Urine Glucose: NEGATIVE
Urobilinogen, UA: 0.2 (ref 0.0–1.0)
WBC, UA: NONE SEEN (ref 0–?)
pH: 6.5 (ref 5.0–8.0)

## 2019-10-04 LAB — CBC
HCT: 39 % (ref 36.0–46.0)
Hemoglobin: 13 g/dL (ref 12.0–15.0)
MCHC: 33.4 g/dL (ref 30.0–36.0)
MCV: 89.5 fl (ref 78.0–100.0)
Platelets: 307 10*3/uL (ref 150.0–400.0)
RBC: 4.36 Mil/uL (ref 3.87–5.11)
RDW: 13.6 % (ref 11.5–15.5)
WBC: 7.5 10*3/uL (ref 4.0–10.5)

## 2019-10-04 LAB — LDL CHOLESTEROL, DIRECT: Direct LDL: 92 mg/dL

## 2019-10-04 LAB — LIPID PANEL
Cholesterol: 178 mg/dL (ref 0–200)
HDL: 67.8 mg/dL (ref >39.00)
LDL Cholesterol: 97 mg/dL (ref 0–99)
NonHDL: 109.88
Total CHOL/HDL Ratio: 3
Triglycerides: 65 mg/dL (ref 0.0–149.0)
VLDL: 13 mg/dL (ref 0.0–40.0)

## 2019-10-04 LAB — TSH: TSH: 1.14 u[IU]/mL (ref 0.35–4.50)

## 2019-10-04 LAB — HEMOGLOBIN A1C: Hgb A1c MFr Bld: 5.9 % (ref 4.6–6.5)

## 2019-10-04 NOTE — Progress Notes (Signed)
Established Patient Office Visit  Subjective:  Patient ID: [name], female    DOB: [birth year]  Age: 49 y.o. MRN: [id number]  CC:  Chief Complaint  Patient presents with  . Annual Exam    CPE, patient states that leg pain in left leg strted today.     HPI [name] presents for yearly exam.  Due for GYN check with her GYN provider.  Status post eye check.  Has not been able to see the dentist.  Is on the fence about the Covid vaccine.  Has gained some weight.  Continues to work in [employer].  Blood pressure remains controlled with amlodipine and lisinopril.  Taking atorvastatin without issue for LDL cholesterol.  In the last day or so she just developed left leg pain.  Pain moves from her medial left thigh down the medial calf into her foot.  Denies back pain numbness tingling or weakness.  Past Medical History:  Diagnosis Date  . Abnormal Pap smear 2000   [facility]opsy  . Allergy   . Arthritis    hands  . Back pain   . Blood transfusion without reported diagnosis 2005  . Breast fibroadenoma   . Diverticulitis of colon with perforation 05/20/2014  . GERD (gastroesophageal reflux disease)   . H/O metrorrhagia 12/2006  . H/O myomectomy 07-2006   robotic  . H/O sinusitis   . Headache(784.0)   . Heart murmur   . History of conization of cervix 2000  . History of ectopic pregnancy 2005   r salpyngectomy  . History of hysterectomy, supracervical 08-2008   robotic  . History of syphilis   . History of syphilis   . Hx of adenomatous polyp of colon 12/31/2017  . Hx of herpes simplex type 2 infection   . Hyperlipidemia   . Hypertension   . Ileostomy present ([place]) 10/26/2014  . Intra-abdominal abscess ([place]) 07/21/2014  . PMS (premenstrual syndrome)     Past Surgical History:  Procedure Laterality Date  . ABDOMINAL HYSTERECTOMY    . BREAST LUMPECTOMY  10/11   rt-neg  . BREAST SURGERY  2004   fibroademoma  . CERVICAL CONE BIOPSY  2000  . COLON RESECTION N/A 07/06/2014    Procedure: LAPAROSCOPIC LOOP ILEOSTOMY WITH PLACEMENT OF PELVIC DRAIN;  Surgeon: Donnie Mesa, MD;  Location: [place];  Service: General;  Laterality: N/A;  . COLON SURGERY     diverticulitis   . COLONOSCOPY    . DILATION AND CURETTAGE OF UTERUS  2009  . DIVERTING ILEOSTOMY Right 06/2014  . ECTOPIC PREGNANCY SURGERY  2005   R salpyngectomy  . ILEOSTOMY N/A 10/26/2014   Procedure: LOOP ILEOSTOMY REVERSAL;  Surgeon: Donnie Mesa, MD;  Location: [place];  Service: General;  Laterality: N/A;  . LAPAROSCOPIC SIGMOID COLECTOMY N/A 06/30/2014   Procedure: LAPAROSCOPIC ASSISTED SIGMOID COLECTOMY;  Surgeon: Donnie Mesa, MD;  Location: [place];  Service: General;  Laterality: N/A;  . MYOMECTOMY  2008   robotic  . POLYPECTOMY  2009  . ROBOTIC ASSISTED LAP VAGINAL HYSTERECTOMY  2010   supracervical  . SALPINGECTOMY  2005  . TRIGGER FINGER RELEASE Right 09/28/2012   Procedure: RELEASE TRIGGER FINGER/A-1 PULLEY RIGHT THUMB;  Surgeon: Jolyn Nap, MD;  Location: [place];  Service: Orthopedics;  Laterality: Right;  . ULNAR NERVE TRANSPOSITION Right 12/01/2018   Procedure: ULNAR NERVE DECOMPRESSION;  Surgeon: Daryll Brod, MD;  Location: [place];  Service: Orthopedics;  Laterality: Right;  AXILLARY  Family History  Problem Relation Age of Onset  . Hypertension Paternal Grandmother   . Diabetes Paternal Grandmother   . Hypertension Maternal Grandmother   . Sarcoidosis Mother   . Hypertension Mother   . Hyperlipidemia Mother   . Thyroid disease Maternal Aunt   . Mental illness Cousin   . Fibroids Sister   . Colon cancer Neg Hx   . Colon polyps Neg Hx   . Esophageal cancer Neg Hx   . Rectal cancer Neg Hx   . Stomach cancer Neg Hx     Social History   Socioeconomic History  . Marital status: Married    Spouse name: [name]  . Number of children: 2  . Years of education: Not on file  . Highest education level: Associate degree: occupational, Hotel manager, or  vocational program  Occupational History  . Occupation: CMA    Comment: [employer] Use  . Smoking status: Former Smoker    Packs/day: 1.00    Years: 20.00    Pack years: 20.00    Types: Cigarettes    Quit date: 06/13/2011    Years since quitting: 8.3  . Smokeless tobacco: Never Used  Vaping Use  . Vaping Use: Never used  Substance and Sexual Activity  . Alcohol use: Not Currently  . Drug use: No  . Sexual activity: Yes  Other Topics Concern  . Not on file  Social History Narrative   Lives with her husband and their son.   Social Determinants of Health   Financial Resource Strain:   . Difficulty of Paying Living Expenses:   Food Insecurity:   . Worried About Charity fundraiser in the Last Year:   . Arboriculturist in the Last Year:   Transportation Needs:   . Film/video editor (Medical):   Marland Kitchen Lack of Transportation (Non-Medical):   Physical Activity:   . Days of Exercise per Week:   . Minutes of Exercise per Session:   Stress:   . Feeling of Stress :   Social Connections:   . Frequency of Communication with Friends and Family:   . Frequency of Social Gatherings with Friends and Family:   . Attends Religious Services:   . Active Member of Clubs or Organizations:   . Attends Archivist Meetings:   Marland Kitchen Marital Status:   Intimate Partner Violence:   . Fear of Current or Ex-Partner:   . Emotionally Abused:   Marland Kitchen Physically Abused:   . Sexually Abused:     Outpatient Medications Prior to Visit  Medication Sig Dispense Refill  . amLODipine (NORVASC) 5 MG tablet Take 1 tablet (5 mg total) by mouth daily. 90 tablet 0  . atorvastatin (LIPITOR) 20 MG tablet Take 1 tablet (20 mg total) by mouth daily. 90 tablet 0  . Black Cohosh (REMIFEMIN) 20 MG TABS Take 40 mg by mouth daily.    Marland Kitchen lisinopril (ZESTRIL) 10 MG tablet Take 1 tablet (10 mg total) by mouth daily. 90 tablet 0  . Multiple Vitamins-Minerals (HAIR/SKIN/NAILS/BIOTIN PO) Take 1  tablet by mouth daily.    Marland Kitchen acetaminophen (TYLENOL) 325 MG tablet Take 2 tablets (650 mg total) by mouth every 6 (six) hours as needed. (Patient not taking: Reported on 10/04/2019) 30 tablet 0  . doxycycline (VIBRAMYCIN) 100 MG capsule Take 1 capsule (100 mg total) by mouth 2 (two) times daily. 20 capsule 0  . HYDROcodone-acetaminophen (NORCO) 5-325 MG tablet Take 1 tablet by mouth every  6 (six) hours as needed. (Patient not taking: Reported on 10/04/2019) 12 tablet 0  . ibuprofen (ADVIL) 800 MG tablet Take 1 tablet (800 mg total) by mouth 3 (three) times daily. 21 tablet 0  . nitroGLYCERIN (NITROSTAT) 0.4 MG SL tablet Place 1 tablet (0.4 mg total) under the tongue every 5 (five) minutes as needed for up to 25 days for chest pain. 25 tablet 1   Facility-Administered Medications Prior to Visit  Medication Dose Route Frequency Provider Last Rate Last Admin  . 0.9 %  sodium chloride infusion  500 mL Intravenous Once Mansouraty, Telford Nab., MD        Allergies  Allergen Reactions  . Keflex [Cephalexin] Anaphylaxis  . Penicillins Anaphylaxis    Has patient had a PCN reaction causing immediate rash, facial/tongue/throat swelling, SOB or lightheadedness with hypotension: Yes Has patient had a PCN reaction causing severe rash involving mucus membranes or skin necrosis: No Has patient had a PCN reaction that required hospitalization: No Has patient had a PCN reaction occurring within the last 10 years: No If all of the above answers are "NO", then may proceed with Cephalosporin use.     ROS Review of Systems  Constitutional: Negative.   HENT: Negative.   Eyes: Negative for photophobia and visual disturbance.  Respiratory: Negative.   Cardiovascular: Negative.   Gastrointestinal: Negative.   Endocrine: Negative for polyphagia and polyuria.  Genitourinary: Negative.   Musculoskeletal: Negative for gait problem.  Skin: Negative for pallor and rash.  Allergic/Immunologic: Negative for  immunocompromised state.  Neurological: Negative for weakness and numbness.  Hematological: Does not bruise/bleed easily.  Psychiatric/Behavioral: Negative.    Depression screen [facility] 2/9 10/04/2019 02/17/2019 04/24/2018  Decreased Interest 0 0 0  Down, Depressed, Hopeless 0 0 0  PHQ - 2 Score 0 0 0      Objective:    Physical Exam Vitals and nursing note reviewed.  Constitutional:      General: She is not in acute distress.    Appearance: Normal appearance. She is obese. She is not ill-appearing, toxic-appearing or diaphoretic.  HENT:     Head: Normocephalic and atraumatic.     Right Ear: Tympanic membrane, ear canal and external ear normal.     Left Ear: Ear canal and external ear normal.     Nose: Nose normal. No congestion or rhinorrhea.  Eyes:     General: No scleral icterus.       Right eye: No discharge.        Left eye: No discharge.     Conjunctiva/sclera: Conjunctivae normal.     Pupils: Pupils are equal, round, and reactive to light.  Neck:     Vascular: No carotid bruit.  Cardiovascular:     Rate and Rhythm: Normal rate and regular rhythm.  Pulmonary:     Effort: Pulmonary effort is normal.     Breath sounds: Normal breath sounds.  Abdominal:     General: Bowel sounds are normal.  Musculoskeletal:     Cervical back: Normal range of motion. No rigidity or tenderness.     Lumbar back: No swelling, deformity, tenderness or bony tenderness. Normal range of motion. Negative right straight leg raise test and negative left straight leg raise test.  Lymphadenopathy:     Cervical: No cervical adenopathy.  Skin:    Coloration: Skin is not jaundiced.  Neurological:     Mental Status: She is alert and oriented to person, place, and time.     Motor: No  weakness.     Deep Tendon Reflexes:     Reflex Scores:      Patellar reflexes are 2+ on the right side and 2+ on the left side.      Achilles reflexes are 1+ on the right side and 1+ on the left side. Psychiatric:         Mood and Affect: Mood normal.        Behavior: Behavior normal.     BP 126/78   Pulse 94   Temp (!) 97.1 F (36.2 C) (Tympanic)   Ht 5\' 4"  (1.626 m)   Wt 221 lb 4.8 oz (100.4 kg)   SpO2 97%   BMI 37.99 kg/m  Wt Readings from Last 3 Encounters:  10/04/19 221 lb 4.8 oz (100.4 kg)  02/17/19 217 lb 6.4 oz (98.6 kg)  12/29/18 213 lb (96.6 kg)     Health Maintenance Due  Topic Date Due  . Hepatitis C Screening  Never done  . PAP SMEAR-Modifier  06/19/2019  . INFLUENZA VACCINE  09/19/2019    There are no preventive care reminders to display for this patient.  Lab Results  Component Value Date   TSH 2.843 03/28/2018   Lab Results  Component Value Date   WBC 5.6 10/08/2018   HGB 13.2 10/08/2018   HCT 39.7 10/08/2018   MCV 89.1 10/08/2018   PLT 312.0 10/08/2018   Lab Results  Component Value Date   NA 139 10/08/2018   K 3.6 10/08/2018   CO2 26 10/08/2018   GLUCOSE 97 10/08/2018   BUN 11 10/08/2018   CREATININE 0.74 10/08/2018   BILITOT 0.5 10/08/2018   ALKPHOS 61 10/08/2018   AST 22 10/08/2018   ALT 21 10/08/2018   PROT 7.6 10/08/2018   ALBUMIN 4.7 10/08/2018   CALCIUM 10.0 10/08/2018   ANIONGAP 10 09/01/2018   GFR 100.70 10/08/2018   Lab Results  Component Value Date   CHOL 180 10/08/2018   Lab Results  Component Value Date   HDL 64.10 10/08/2018   Lab Results  Component Value Date   LDLCALC 102 (H) 10/08/2018   Lab Results  Component Value Date   TRIG 70.0 10/08/2018   Lab Results  Component Value Date   CHOLHDL 3 10/08/2018   Lab Results  Component Value Date   HGBA1C 5.7 10/08/2018      Assessment & Plan:   Problem List Items Addressed This Visit      Cardiovascular and Mediastinum   Essential hypertension - Primary   Relevant Orders   CBC   Comprehensive metabolic panel   TSH   Urinalysis, Routine w reflex microscopic     Other   Elevated glucose   Relevant Orders   Hemoglobin A1c   Elevated LDL cholesterol level    Relevant Orders   Lipid panel   LDL cholesterol, direct   Healthcare maintenance   Relevant Orders   TSH   Left leg pain      No orders of the defined types were placed in this encounter.   Follow-up: Return in about 6 months (around 04/05/2020).  Advised COVID vaccine and dental care.  Patient advised to consider weight watchers and was given information on counting calories health maintenance and disease prevention.  She will follow-up with GYN for Pap and pelvic exams.  Due for colonoscopy next year.  Continue current medications for blood pressure and cholesterol.  Follow-up if left leg pain persists or becomes worse.  Also given information on exercising  to lose weight.  Libby Maw, MD

## 2019-10-04 NOTE — Patient Instructions (Addendum)
Health Maintenance, Female Adopting a healthy lifestyle and getting preventive care are important in promoting health and wellness. Ask your health care provider about:  The right schedule for you to have regular tests and exams.  Things you can do on your own to prevent diseases and keep yourself healthy. What should I know about diet, weight, and exercise? Eat a healthy diet   Eat a diet that includes plenty of vegetables, fruits, low-fat dairy products, and lean protein.  Do not eat a lot of foods that are high in solid fats, added sugars, or sodium. Maintain a healthy weight Body mass index (BMI) is used to identify weight problems. It estimates body fat based on height and weight. Your health care provider can help determine your BMI and help you achieve or maintain a healthy weight. Get regular exercise Get regular exercise. This is one of the most important things you can do for your health. Most adults should:  Exercise for at least 150 minutes each week. The exercise should increase your heart rate and make you sweat (moderate-intensity exercise).  Do strengthening exercises at least twice a week. This is in addition to the moderate-intensity exercise.  Spend less time sitting. Even light physical activity can be beneficial. Watch cholesterol and blood lipids Have your blood tested for lipids and cholesterol at 50 years of age, then have this test every 5 years. Have your cholesterol levels checked more often if:  Your lipid or cholesterol levels are high.  You are older than 50 years of age.  You are at high risk for heart disease. What should I know about cancer screening? Depending on your health history and family history, you may need to have cancer screening at various ages. This may include screening for:  Breast cancer.  Cervical cancer.  Colorectal cancer.  Skin cancer.  Lung cancer. What should I know about heart disease, diabetes, and high blood  pressure? Blood pressure and heart disease  High blood pressure causes heart disease and increases the risk of stroke. This is more likely to develop in people who have high blood pressure readings, are of African descent, or are overweight.  Have your blood pressure checked: ? Every 3-5 years if you are 50-60 years of age. ? Every year if you are 50 years old or older. Diabetes Have regular diabetes screenings. This checks your fasting blood sugar level. Have the screening done:  Once every three years after age 50 if you are at a normal weight and have a low risk for diabetes.  More often and at a younger age if you are overweight or have a high risk for diabetes. What should I know about preventing infection? Hepatitis B If you have a higher risk for hepatitis B, you should be screened for this virus. Talk with your health care provider to find out if you are at risk for hepatitis B infection. Hepatitis C Testing is recommended for:  Everyone born from [birth year] through 1965.  Anyone with known risk factors for hepatitis C. Sexually transmitted infections (STIs)  Get screened for STIs, including gonorrhea and chlamydia, if: ? You are sexually active and are younger than 50 years of age. ? You are older than 50 years of age and your health care provider tells you that you are at risk for this type of infection. ? Your sexual activity has changed since you were last screened, and you are at increased risk for chlamydia or gonorrhea. Ask your health care provider if  you are at risk. °· Ask your health care provider about whether you are at high risk for HIV. Your health care provider may recommend a prescription medicine to help prevent HIV infection. If you choose to take medicine to prevent HIV, you should first get tested for HIV. You should then be tested every 3 months for as long as you are taking the medicine. °Pregnancy °· If you are about to stop having your period (premenopausal) and  you may become pregnant, seek counseling before you get pregnant. °· Take 400 to 800 micrograms (mcg) of folic acid every day if you become pregnant. °· Ask for birth control (contraception) if you want to prevent pregnancy. °Osteoporosis and menopause °Osteoporosis is a disease in which the bones lose minerals and strength with aging. This can result in bone fractures. If you are 65 years old or older, or if you are at risk for osteoporosis and fractures, ask your health care provider if you should: °· Be screened for bone loss. °· Take a calcium or vitamin D supplement to lower your risk of fractures. °· Be given hormone replacement therapy (HRT) to treat symptoms of menopause. °Follow these instructions at home: °Lifestyle °· Do not use any products that contain nicotine or tobacco, such as cigarettes, e-cigarettes, and chewing tobacco. If you need help quitting, ask your health care provider. °· Do not use street drugs. °· Do not share needles. °· Ask your health care provider for help if you need support or information about quitting drugs. °Alcohol use °· Do not drink alcohol if: °? Your health care provider tells you not to drink. °? You are pregnant, may be pregnant, or are planning to become pregnant. °· If you drink alcohol: °? Limit how much you use to 0-1 drink a day. °? Limit intake if you are breastfeeding. °· Be aware of how much alcohol is in your drink. In the [place] one drink equals one 12 oz bottle of beer (355 mL), one 5 oz glass of wine (148 mL), or one 1½ oz glass of hard liquor (44 mL). °General instructions °· Schedule regular health, dental, and eye exams. °· Stay current with your vaccines. °· Tell your health care provider if: °? You often feel depressed. °? You have ever been abused or do not feel safe at home. °Summary °· Adopting a healthy lifestyle and getting preventive care are important in promoting health and wellness. °· Follow your health care provider's instructions about healthy  diet, exercising, and getting tested or screened for diseases. °· Follow your health care provider's instructions on monitoring your cholesterol and blood pressure. °This information is not intended to replace advice given to you by your health care provider. Make sure you discuss any questions you have with your health care provider. °Document Revised: 01/28/2018 Document Reviewed: 01/28/2018 °Elsevier Patient Education © 2020 [employer] ° °Preventive Care 40-64 Years Old, Female °Preventive care refers to visits with your health care provider and lifestyle choices that can promote health and wellness. This includes: °· A yearly physical exam. This may also be called an annual well check. °· Regular dental visits and eye exams. °· Immunizations. °· Screening for certain conditions. °· Healthy lifestyle choices, such as eating a healthy diet, getting regular exercise, not using drugs or products that contain nicotine and tobacco, and limiting alcohol use. °What can I expect for my preventive care visit? °Physical exam °Your health care provider will check your: °· Height and weight. This may be used   to calculate body mass index (BMI), which tells if you are at a healthy weight. °· Heart rate and blood pressure. °· Skin for abnormal spots. °Counseling °Your health care provider may ask you questions about your: °· Alcohol, tobacco, and drug use. °· Emotional well-being. °· Home and relationship well-being. °· Sexual activity. °· Eating habits. °· Work and work environment. °· Method of birth control. °· Menstrual cycle. °· Pregnancy history. °What immunizations do I need? ° °Influenza (flu) vaccine °· This is recommended every year. °Tetanus, diphtheria, and pertussis (Tdap) vaccine °· You may need a Td booster every 10 years. °Varicella (chickenpox) vaccine °· You may need this if you have not been vaccinated. °Zoster (shingles) vaccine °· You may need this after age 60. °Measles, mumps, and rubella (MMR)  vaccine °· You may need at least one dose of MMR if you were born in 1957 or later. You may also need a second dose. °Pneumococcal conjugate (PCV13) vaccine °· You may need this if you have certain conditions and were not previously vaccinated. °Pneumococcal polysaccharide (PPSV23) vaccine °· You may need one or two doses if you smoke cigarettes or if you have certain conditions. °Meningococcal conjugate (MenACWY) vaccine °· You may need this if you have certain conditions. °Hepatitis A vaccine °· You may need this if you have certain conditions or if you travel or work in places where you may be exposed to hepatitis A. °Hepatitis B vaccine °· You may need this if you have certain conditions or if you travel or work in places where you may be exposed to hepatitis B. °Haemophilus influenzae type b (Hib) vaccine °· You may need this if you have certain conditions. °Human papillomavirus (HPV) vaccine °· If recommended by your health care provider, you may need three doses over 6 months. °You may receive vaccines as individual doses or as more than one vaccine together in one shot (combination vaccines). Talk with your health care provider about the risks and benefits of combination vaccines. °What tests do I need? °Blood tests °· Lipid and cholesterol levels. These may be checked every 5 years, or more frequently if you are over 50 years old. °· Hepatitis C test. °· Hepatitis B test. °Screening °· Lung cancer screening. You may have this screening every year starting at age 55 if you have a 30-pack-year history of smoking and currently smoke or have quit within the past 15 years. °· Colorectal cancer screening. All adults should have this screening starting at age 50 and continuing until age 75. Your health care provider may recommend screening at age 45 if you are at increased risk. You will have tests every 1-10 years, depending on your results and the type of screening test. °· Diabetes screening. This is done by  checking your blood sugar (glucose) after you have not eaten for a while (fasting). You may have this done every 1-3 years. °· Mammogram. This may be done every 1-2 years. Talk with your health care provider about when you should start having regular mammograms. This may depend on whether you have a family history of breast cancer. °· BRCA-related cancer screening. This may be done if you have a family history of breast, ovarian, tubal, or peritoneal cancers. °· Pelvic exam and Pap test. This may be done every 3 years starting at age 21. Starting at age 30, this may be done every 5 years if you have a Pap test in combination with an HPV test. °Other tests °· Sexually transmitted disease (  STD) testing.  Bone density scan. This is done to screen for osteoporosis. You may have this scan if you are at high risk for osteoporosis. Follow these instructions at home: Eating and drinking  Eat a diet that includes fresh fruits and vegetables, whole grains, lean protein, and low-fat dairy.  Take vitamin and mineral supplements as recommended by your health care provider.  Do not drink alcohol if: ? Your health care provider tells you not to drink. ? You are pregnant, may be pregnant, or are planning to become pregnant.  If you drink alcohol: ? Limit how much you have to 0-1 drink a day. ? Be aware of how much alcohol is in your drink. In the [place] one drink equals one 12 oz bottle of beer (355 mL), one 5 oz glass of wine (148 mL), or one 1 oz glass of hard liquor (44 mL). Lifestyle  Take daily care of your teeth and gums.  Stay active. Exercise for at least 30 minutes on 5 or more days each week.  Do not use any products that contain nicotine or tobacco, such as cigarettes, e-cigarettes, and chewing tobacco. If you need help quitting, ask your health care provider.  If you are sexually active, practice safe sex. Use a condom or other form of birth control (contraception) in order to prevent pregnancy  and STIs (sexually transmitted infections).  If told by your health care provider, take low-dose aspirin daily starting at age 6. What's next?  Visit your health care provider once a year for a well check visit.  Ask your health care provider how often you should have your eyes and teeth checked.  Stay up to date on all vaccines. This information is not intended to replace advice given to you by your health care provider. Make sure you discuss any questions you have with your health care provider. Document Revised: 10/16/2017 Document Reviewed: 10/16/2017 Elsevier Patient Education  2020 [place] [employer] Calories are units of energy. Your body needs a certain amount of calories from food to keep you going throughout the day. When you eat more calories than your body needs, your body stores the extra calories as fat. When you eat fewer calories than your body needs, your body burns fat to get the energy it needs. Calorie counting means keeping track of how many calories you eat and drink each day. Calorie counting can be helpful if you need to lose weight. If you make sure to eat fewer calories than your body needs, you should lose weight. Ask your health care provider what a healthy weight is for you. For calorie counting to work, you will need to eat the right number of calories in a day in order to lose a healthy amount of weight per week. A dietitian can help you determine how many calories you need in a day and will give you suggestions on how to reach your calorie goal.  A healthy amount of weight to lose per week is usually 1-2 lb (0.5-0.9 kg). This usually means that your daily calorie intake should be reduced by 500-750 calories.  Eating 1,200 - 1,500 calories per day can help most women lose weight.  Eating 1,500 - 1,800 calories per day can help most men lose weight. What is my plan? My goal is to have __________ calories per day. If I have this  many calories per day, I should lose around __________ pounds per week. What do I need to  know about calorie counting? In order to meet your daily calorie goal, you will need to:  Find out how many calories are in each food you would like to eat. Try to do this before you eat.  Decide how much of the food you plan to eat.  Write down what you ate and how many calories it had. Doing this is called keeping a food log. To successfully lose weight, it is important to balance calorie counting with a healthy lifestyle that includes regular activity. Aim for 150 minutes of moderate exercise (such as walking) or 75 minutes of vigorous exercise (such as running) each week. Where do I find calorie information?  The number of calories in a food can be found on a Nutrition Facts label. If a food does not have a Nutrition Facts label, try to look up the calories online or ask your dietitian for help. Remember that calories are listed per serving. If you choose to have more than one serving of a food, you will have to multiply the calories per serving by the amount of servings you plan to eat. For example, the label on a package of bread might say that a serving size is 1 slice and that there are 90 calories in a serving. If you eat 1 slice, you will have eaten 90 calories. If you eat 2 slices, you will have eaten 180 calories. How do I keep a food log? Immediately after each meal, record the following information in your food log:  What you ate. Don't forget to include toppings, sauces, and other extras on the food.  How much you ate. This can be measured in cups, ounces, or number of items.  How many calories each food and drink had.  The total number of calories in the meal. Keep your food log near you, such as in a small notebook in your pocket, or use a mobile app or website. Some programs will calculate calories for you and show you how many calories you have left for the day to meet your goal. What  are some calorie counting tips?   Use your calories on foods and drinks that will fill you up and not leave you hungry: ? Some examples of foods that fill you up are nuts and nut butters, vegetables, lean proteins, and high-fiber foods like whole grains. High-fiber foods are foods with more than 5 g fiber per serving. ? Drinks such as sodas, specialty coffee drinks, alcohol, and juices have a lot of calories, yet do not fill you up.  Eat nutritious foods and avoid empty calories. Empty calories are calories you get from foods or beverages that do not have many vitamins or protein, such as candy, sweets, and soda. It is better to have a nutritious high-calorie food (such as an avocado) than a food with few nutrients (such as a bag of chips).  Know how many calories are in the foods you eat most often. This will help you calculate calorie counts faster.  Pay attention to calories in drinks. Low-calorie drinks include water and unsweetened drinks.  Pay attention to nutrition labels for "low fat" or "fat free" foods. These foods sometimes have the same amount of calories or more calories than the full fat versions. They also often have added sugar, starch, or salt, to make up for flavor that was removed with the fat.  Find a way of tracking calories that works for you. Get creative. Try different apps or programs if writing down  calories does not work for you. What are some portion control tips?  Know how many calories are in a serving. This will help you know how many servings of a certain food you can have.  Use a measuring cup to measure serving sizes. You could also try weighing out portions on a kitchen scale. With time, you will be able to estimate serving sizes for some foods.  Take some time to put servings of different foods on your favorite plates, bowls, and cups so you know what a serving looks like.  Try not to eat straight from a bag or box. Doing this can lead to overeating. Put  the amount you would like to eat in a cup or on a plate to make sure you are eating the right portion.  Use smaller plates, glasses, and bowls to prevent overeating.  Try not to multitask (for example, watch TV or use your computer) while eating. If it is time to eat, sit down at a table and enjoy your food. This will help you to know when you are full. It will also help you to be aware of what you are eating and how much you are eating. What are tips for following this plan? Reading food labels  Check the calorie count compared to the serving size. The serving size may be smaller than what you are used to eating.  Check the source of the calories. Make sure the food you are eating is high in vitamins and protein and low in saturated and trans fats. Shopping  Read nutrition labels while you shop. This will help you make healthy decisions before you decide to purchase your food.  Make a grocery list and stick to it. Cooking  Try to cook your favorite foods in a healthier way. For example, try baking instead of frying.  Use low-fat dairy products. Meal planning  Use more fruits and vegetables. Half of your plate should be fruits and vegetables.  Include lean proteins like poultry and fish. How do I count calories when eating out?  Ask for smaller portion sizes.  Consider sharing an entree and sides instead of getting your own entree.  If you get your own entree, eat only half. Ask for a box at the beginning of your meal and put the rest of your entree in it so you are not tempted to eat it.  If calories are listed on the menu, choose the lower calorie options.  Choose dishes that include vegetables, fruits, whole grains, low-fat dairy products, and lean protein.  Choose items that are boiled, broiled, grilled, or steamed. Stay away from items that are buttered, battered, fried, or served with cream sauce. Items labeled "crispy" are usually fried, unless stated otherwise.  Choose  water, low-fat milk, unsweetened iced tea, or other drinks without added sugar. If you want an alcoholic beverage, choose a lower calorie option such as a glass of wine or light beer.  Ask for dressings, sauces, and syrups on the side. These are usually high in calories, so you should limit the amount you eat.  If you want a salad, choose a garden salad and ask for grilled meats. Avoid extra toppings like bacon, cheese, or fried items. Ask for the dressing on the side, or ask for olive oil and vinegar or lemon to use as dressing.  Estimate how many servings of a food you are given. For example, a serving of cooked rice is  cup or about the size of half  a baseball. Knowing serving sizes will help you be aware of how much food you are eating at restaurants. The list below tells you how big or small some common portion sizes are based on everyday objects: ? 1 oz--4 stacked dice. ? 3 oz--1 deck of cards. ? 1 tsp--1 die. ? 1 Tbsp-- a ping-pong ball. ? 2 Tbsp--1 ping-pong ball. ?  cup-- baseball. ? 1 cup--1 baseball. Summary  Calorie counting means keeping track of how many calories you eat and drink each day. If you eat fewer calories than your body needs, you should lose weight.  A healthy amount of weight to lose per week is usually 1-2 lb (0.5-0.9 kg). This usually means reducing your daily calorie intake by 500-750 calories.  The number of calories in a food can be found on a Nutrition Facts label. If a food does not have a Nutrition Facts label, try to look up the calories online or ask your dietitian for help.  Use your calories on foods and drinks that will fill you up, and not on foods and drinks that will leave you hungry.  Use smaller plates, glasses, and bowls to prevent overeating. This information is not intended to replace advice given to you by your health care provider. Make sure you discuss any questions you have with your health care provider. Document Revised: 10/24/2017  Document Reviewed: 01/05/2016 Elsevier Patient Education  2020 [employer].  Exercising to Lose Weight Exercise is structured, repetitive physical activity to improve fitness and health. Getting regular exercise is important for everyone. It is especially important if you are overweight. Being overweight increases your risk of heart disease, stroke, diabetes, high blood pressure, and several types of cancer. Reducing your calorie intake and exercising can help you lose weight. Exercise is usually categorized as moderate or vigorous intensity. To lose weight, most people need to do a certain amount of moderate-intensity or vigorous-intensity exercise each week. Moderate-intensity exercise  Moderate-intensity exercise is any activity that gets you moving enough to burn at least three times more energy (calories) than if you were sitting. Examples of moderate exercise include:  Walking a mile in 15 minutes.  Doing light yard work.  Biking at an easy pace. Most people should get at least 150 minutes (2 hours and 30 minutes) a week of moderate-intensity exercise to maintain their body weight. Vigorous-intensity exercise Vigorous-intensity exercise is any activity that gets you moving enough to burn at least six times more calories than if you were sitting. When you exercise at this intensity, you should be working hard enough that you are not able to carry on a conversation. Examples of vigorous exercise include:  Running.  Playing a team sport, such as football, basketball, and soccer.  Jumping rope. Most people should get at least 75 minutes (1 hour and 15 minutes) a week of vigorous-intensity exercise to maintain their body weight. How can exercise affect me? When you exercise enough to burn more calories than you eat, you lose weight. Exercise also reduces body fat and builds muscle. The more muscle you have, the more calories you burn. Exercise also:  Improves mood.  Reduces stress  and tension.  Improves your overall fitness, flexibility, and endurance.  Increases bone strength. The amount of exercise you need to lose weight depends on:  Your age.  The type of exercise.  Any health conditions you have.  Your overall physical ability. Talk to your health care provider about how much exercise you need and what types  of activities are safe for you. What actions can I take to lose weight? Nutrition   Make changes to your diet as told by your health care provider or diet and nutrition specialist (dietitian). This may include: ? Eating fewer calories. ? Eating more protein. ? Eating less unhealthy fats. ? Eating a diet that includes fresh fruits and vegetables, whole grains, low-fat dairy products, and lean protein. ? Avoiding foods with added fat, salt, and sugar.  Drink plenty of water while you exercise to prevent dehydration or heat stroke. Activity  Choose an activity that you enjoy and set realistic goals. Your health care provider can help you make an exercise plan that works for you.  Exercise at a moderate or vigorous intensity most days of the week. ? The intensity of exercise may vary from person to person. You can tell how intense a workout is for you by paying attention to your breathing and heartbeat. Most people will notice their breathing and heartbeat get faster with more intense exercise.  Do resistance training twice each week, such as: ? Push-ups. ? Sit-ups. ? Lifting weights. ? Using resistance bands.  Getting short amounts of exercise can be just as helpful as long structured periods of exercise. If you have trouble finding time to exercise, try to include exercise in your daily routine. ? Get up, stretch, and walk around every 30 minutes throughout the day. ? Go for a walk during your lunch break. ? Park your car farther away from your destination. ? If you take public transportation, get off one stop early and walk the rest of the  way. ? Make phone calls while standing up and walking around. ? Take the stairs instead of elevators or escalators.  Wear comfortable clothes and shoes with good support.  Do not exercise so much that you hurt yourself, feel dizzy, or get very short of breath. Where to find more information  U.S. Department of Health and Human Services: [employer].at  Centers for Disease Control and Prevention (CDC): http://www.wolf.info/ Contact a health care provider:  Before starting a new exercise program.  If you have questions or concerns about your weight.  If you have a medical problem that keeps you from exercising. Get help right away if you have any of the following while exercising:  Injury.  Dizziness.  Difficulty breathing or shortness of breath that does not go away when you stop exercising.  Chest pain.  Rapid heartbeat. Summary  Being overweight increases your risk of heart disease, stroke, diabetes, high blood pressure, and several types of cancer.  Losing weight happens when you burn more calories than you eat.  Reducing the amount of calories you eat in addition to getting regular moderate or vigorous exercise each week helps you lose weight. This information is not intended to replace advice given to you by your health care provider. Make sure you discuss any questions you have with your health care provider. Document Revised: 02/17/2017 Document Reviewed: 02/17/2017 Elsevier Patient Education  2020 [employer].

## 2019-10-10 ENCOUNTER — Emergency Department (HOSPITAL_BASED_OUTPATIENT_CLINIC_OR_DEPARTMENT_OTHER): Payer: 59

## 2019-10-10 ENCOUNTER — Other Ambulatory Visit: Payer: Self-pay

## 2019-10-10 ENCOUNTER — Encounter (HOSPITAL_BASED_OUTPATIENT_CLINIC_OR_DEPARTMENT_OTHER): Payer: Self-pay | Admitting: Emergency Medicine

## 2019-10-10 ENCOUNTER — Emergency Department (HOSPITAL_BASED_OUTPATIENT_CLINIC_OR_DEPARTMENT_OTHER)
Admission: EM | Admit: 2019-10-10 | Discharge: 2019-10-10 | Disposition: A | Discharge status: Home / Self Care | Payer: 59 | Attending: Emergency Medicine | Admitting: Emergency Medicine

## 2019-10-10 DIAGNOSIS — Z79899 Other long term (current) drug therapy: Secondary | ICD-10-CM | POA: Diagnosis not present

## 2019-10-10 DIAGNOSIS — R197 Diarrhea, unspecified: Secondary | ICD-10-CM | POA: Diagnosis not present

## 2019-10-10 DIAGNOSIS — R1084 Generalized abdominal pain: Secondary | ICD-10-CM | POA: Diagnosis not present

## 2019-10-10 DIAGNOSIS — K219 Gastro-esophageal reflux disease without esophagitis: Secondary | ICD-10-CM | POA: Diagnosis not present

## 2019-10-10 DIAGNOSIS — Z87891 Personal history of nicotine dependence: Secondary | ICD-10-CM | POA: Diagnosis not present

## 2019-10-10 DIAGNOSIS — U071 COVID-19: Secondary | ICD-10-CM

## 2019-10-10 DIAGNOSIS — R109 Unspecified abdominal pain: Secondary | ICD-10-CM

## 2019-10-10 DIAGNOSIS — R509 Fever, unspecified: Secondary | ICD-10-CM | POA: Diagnosis present

## 2019-10-10 LAB — CBC
HCT: 43.3 % (ref 36.0–46.0)
Hemoglobin: 13.7 g/dL (ref 12.0–15.0)
MCH: 29.3 pg (ref 26.0–34.0)
MCHC: 31.6 g/dL (ref 30.0–36.0)
MCV: 92.5 fL (ref 80.0–100.0)
Platelets: 313 10*3/uL (ref 150–400)
RBC: 4.68 MIL/uL (ref 3.87–5.11)
RDW: 13.2 % (ref 11.5–15.5)
WBC: 6.1 10*3/uL (ref 4.0–10.5)
nRBC: 0 % (ref 0.0–0.2)

## 2019-10-10 LAB — URINALYSIS, ROUTINE W REFLEX MICROSCOPIC
Bilirubin Urine: NEGATIVE
Glucose, UA: NEGATIVE mg/dL
Hgb urine dipstick: NEGATIVE
Ketones, ur: NEGATIVE mg/dL
Leukocytes,Ua: NEGATIVE
Nitrite: NEGATIVE
Protein, ur: NEGATIVE mg/dL
Specific Gravity, Urine: >1.03 — ABNORMAL HIGH (ref 1.005–1.030)
pH: 5.5 (ref 5.0–8.0)

## 2019-10-10 LAB — COMPREHENSIVE METABOLIC PANEL
ALT: 28 U/L (ref 0–44)
AST: 28 U/L (ref 15–41)
Albumin: 4.3 g/dL (ref 3.5–5.0)
Alkaline Phosphatase: 52 U/L (ref 38–126)
Anion gap: 9 (ref 5–15)
BUN: 10 mg/dL (ref 6–20)
CO2: 25 mmol/L (ref 22–32)
Calcium: 9.5 mg/dL (ref 8.9–10.3)
Chloride: 104 mmol/L (ref 98–111)
Creatinine, Ser: 0.82 mg/dL (ref 0.44–1.00)
GFR calc Af Amer: >60 mL/min (ref >60)
GFR calc non Af Amer: >60 mL/min (ref >60)
Glucose, Bld: 107 mg/dL — ABNORMAL HIGH (ref 70–99)
Potassium: 4.1 mmol/L (ref 3.5–5.1)
Sodium: 138 mmol/L (ref 135–145)
Total Bilirubin: 0.5 mg/dL (ref 0.3–1.2)
Total Protein: 8 g/dL (ref 6.5–8.1)

## 2019-10-10 LAB — SARS CORONAVIRUS 2 BY RT PCR (HOSPITAL ORDER, PERFORMED IN ~~LOC~~ HOSPITAL LAB): SARS Coronavirus 2: POSITIVE — AB

## 2019-10-10 LAB — LIPASE, BLOOD: Lipase: 34 U/L (ref 11–51)

## 2019-10-10 MED ORDER — IOHEXOL 300 MG/ML  SOLN
100.0000 mL | Freq: Once | INTRAMUSCULAR | Status: AC | PRN
  Administered 2019-10-10: 100 mL via INTRAVENOUS

## 2019-10-10 MED ORDER — ONDANSETRON HCL 4 MG/2ML IJ SOLN
4.0000 mg | Freq: Once | INTRAMUSCULAR | Status: AC
  Administered 2019-10-10: 4 mg via INTRAVENOUS
  Filled 2019-10-10: qty 2

## 2019-10-10 MED ORDER — MORPHINE SULFATE (PF) 4 MG/ML IV SOLN
4.0000 mg | Freq: Once | INTRAVENOUS | Status: AC
  Administered 2019-10-10: 4 mg via INTRAVENOUS
  Filled 2019-10-10: qty 1

## 2019-10-10 NOTE — ED Notes (Signed)
Pt reports body aches with some left sided abdominal pain and diarrhea. Denies SOB.

## 2019-10-10 NOTE — Discharge Instructions (Signed)
Your work-up today confirmed COVID-19 infection likely causing the multitude of symptoms you have been experiencing. The CT scan did not show evidence of acute diverticulitis. We feel you are safe for discharge home as you are not hypoxic and you otherwise appear well now. Please rest and stay hydrated. Please follow-up with your primary doctor. If any symptoms change or worsen, please return to the nearest emergency department.

## 2019-10-10 NOTE — ED Notes (Signed)
Left AC IV access obtained utilizing ultrasound, pt tolerated very well, after insertion site easily flushed and was able to withdraw blood, site WNL, site secured and dsg applied by other RN

## 2019-10-10 NOTE — ED Provider Notes (Signed)
[facility] EMERGENCY DEPARTMENT Provider Note   CSN: [id number] Arrival date & time: 10/10/19  6761     History Chief Complaint  Patient presents with  . Fever  . Abdominal Pain    [name] is a 49 y.o. female.  The history is provided by the patient and medical records. No language interpreter was used.  Abdominal Pain Pain location:  Generalized Pain quality: aching   Pain radiates to:  Does not radiate Pain severity:  Moderate Onset quality:  Gradual Duration:  3 days Timing:  Constant Progression:  Waxing and waning Chronicity:  New Relieved by:  Nothing Worsened by:  Palpation Ineffective treatments:  None tried Associated symptoms: chills, diarrhea, fatigue, fever and nausea   Associated symptoms: no chest pain, no constipation, no cough, no dysuria, no melena, no shortness of breath and no vomiting        Past Medical History:  Diagnosis Date  . Abnormal Pap smear 2000   [facility] Biopsy  . Allergy   . Arthritis    hands  . Back pain   . Blood transfusion without reported diagnosis 2005  . Breast fibroadenoma   . Diverticulitis of colon with perforation 05/20/2014  . GERD (gastroesophageal reflux disease)   . H/O metrorrhagia 12/2006  . H/O myomectomy 07-2006   robotic  . H/O sinusitis   . Headache(784.0)   . Heart murmur   . History of conization of cervix 2000  . History of ectopic pregnancy 2005   r salpyngectomy  . History of hysterectomy, supracervical 08-2008   robotic  . History of syphilis   . History of syphilis   . Hx of adenomatous polyp of colon 12/31/2017  . Hx of herpes simplex type 2 infection   . Hyperlipidemia   . Hypertension   . Ileostomy present ([place]) 10/26/2014  . Intra-abdominal abscess ([place]) 07/21/2014  . PMS (premenstrual syndrome)     Patient Active Problem List   Diagnosis Date Noted  . Left leg pain 10/04/2019  . Elevated glucose 10/08/2018  . Elevated LDL cholesterol level 10/08/2018  . Healthcare  maintenance 10/08/2018  . Need for immunization against influenza 10/08/2018  . Obesity (BMI 30-39.9) 03/29/2018  . Acute chest pain 03/27/2018  . Nonspecific abnormal electrocardiogram (ECG) (EKG) 03/27/2018  . Atypical chest pain 03/27/2018  . Hand pain, right 03/25/2018  . Dyslipidemia 08/15/2017  . History of hysterectomy, supracervical 02/14/2017  . Trigger ring finger of right hand 08/07/2015  . Primary osteoarthritis of first carpometacarpal joint of right hand 08/07/2015  . Diverticulitis of sigmoid colon s/p colectomy 06/30/2014 07/26/2014  . Tachycardia 02/04/2014  . Hx of adenomatous polyp of colon 12/23/2013  . Essential hypertension 11/02/2013  . Former smoker 11/26/2008    Past Surgical History:  Procedure Laterality Date  . ABDOMINAL HYSTERECTOMY    . BREAST LUMPECTOMY  10/11   rt-neg  . BREAST SURGERY  2004   fibroademoma  . CERVICAL CONE BIOPSY  2000  . COLON RESECTION N/A 07/06/2014   Procedure: LAPAROSCOPIC LOOP ILEOSTOMY WITH PLACEMENT OF PELVIC DRAIN;  Surgeon: Donnie Mesa, MD;  Location: [facility];  Service: General;  Laterality: N/A;  . COLON SURGERY     diverticulitis   . COLONOSCOPY    . DILATION AND CURETTAGE OF UTERUS  2009  . DIVERTING ILEOSTOMY Right 06/2014  . ECTOPIC PREGNANCY SURGERY  2005   R salpyngectomy  . ILEOSTOMY N/A 10/26/2014   Procedure: LOOP ILEOSTOMY REVERSAL;  Surgeon: Donnie Mesa, MD;  Location:  [facility];  Service: General;  Laterality: N/A;  . LAPAROSCOPIC SIGMOID COLECTOMY N/A 06/30/2014   Procedure: LAPAROSCOPIC ASSISTED SIGMOID COLECTOMY;  Surgeon: Donnie Mesa, MD;  Location: [facility];  Service: General;  Laterality: N/A;  . MYOMECTOMY  2008   robotic  . POLYPECTOMY  2009  . ROBOTIC ASSISTED LAP VAGINAL HYSTERECTOMY  2010   supracervical  . SALPINGECTOMY  2005  . TRIGGER FINGER RELEASE Right 09/28/2012   Procedure: RELEASE TRIGGER FINGER/A-1 PULLEY RIGHT THUMB;  Surgeon: Jolyn Nap, MD;  Location: [facility];   Service: Orthopedics;  Laterality: Right;  . ULNAR NERVE TRANSPOSITION Right 12/01/2018   Procedure: ULNAR NERVE DECOMPRESSION;  Surgeon: Daryll Brod, MD;  Location: [facility];  Service: Orthopedics;  Laterality: Right;  AXILLARY     OB History    Gravida  4   Para  2   Term  2   Preterm      AB  2   Living  2     SAB  0   TAB  1   Ectopic  1   Multiple  0   Live Births              Family History  Problem Relation Age of Onset  . Hypertension Paternal Grandmother   . Diabetes Paternal Grandmother   . Hypertension Maternal Grandmother   . Sarcoidosis Mother   . Hypertension Mother   . Hyperlipidemia Mother   . Thyroid disease Maternal Aunt   . Mental illness Cousin   . Fibroids Sister   . Colon cancer Neg Hx   . Colon polyps Neg Hx   . Esophageal cancer Neg Hx   . Rectal cancer Neg Hx   . Stomach cancer Neg Hx     Social History   Tobacco Use  . Smoking status: Former Smoker    Packs/day: 1.00    Years: 20.00    Pack years: 20.00    Types: Cigarettes    Quit date: 06/13/2011    Years since quitting: 8.3  . Smokeless tobacco: Never Used  Vaping Use  . Vaping Use: Never used  Substance Use Topics  . Alcohol use: Not Currently  . Drug use: No    Home Medications Prior to Admission medications   Medication Sig Start Date End Date Taking? Authorizing Provider  amLODipine (NORVASC) 5 MG tablet Take 1 tablet (5 mg total) by mouth daily. 09/07/19   Libby Maw, MD  atorvastatin (LIPITOR) 20 MG tablet Take 1 tablet (20 mg total) by mouth daily. 09/07/19   Libby Maw, MD  Black Cohosh (REMIFEMIN) 20 MG TABS Take 40 mg by mouth daily.    [provider]  lisinopril (ZESTRIL) 10 MG tablet Take 1 tablet (10 mg total) by mouth daily. 09/07/19   Libby Maw, MD  Multiple Vitamins-Minerals (HAIR/SKIN/NAILS/BIOTIN PO) Take 1 tablet by mouth daily.    [provider]  nitroGLYCERIN (NITROSTAT)  0.4 MG SL tablet Place 1 tablet (0.4 mg total) under the tongue every 5 (five) minutes as needed for up to 25 days for chest pain. 09/06/19 10/01/19  Adrian Prows, MD    Allergies    Keflex [cephalexin] and Penicillins  Review of Systems   Review of Systems  Constitutional: Positive for chills, fatigue and fever. Negative for diaphoresis.  HENT: Negative for congestion.   Eyes: Negative for visual disturbance.  Respiratory: Negative for cough, chest tightness, shortness of breath and wheezing.  Cardiovascular: Negative for chest pain, palpitations and leg swelling.  Gastrointestinal: Positive for abdominal pain, diarrhea and nausea. Negative for abdominal distention, constipation, melena and vomiting.  Genitourinary: Negative for dysuria.  Musculoskeletal: Negative for back pain and neck pain.  Skin: Negative for rash and wound.  Neurological: Negative for light-headedness, numbness and headaches.  Psychiatric/Behavioral: Negative for agitation.  All other systems reviewed and are negative.   Physical Exam Updated Vital Signs BP 128/73   Pulse (!) 101   Temp 99 F (37.2 C) (Oral)   Resp 16   Ht 5\' 4"  (1.626 m)   Wt 100.2 kg   SpO2 97%   BMI 37.93 kg/m   Physical Exam Vitals and nursing note reviewed.  Constitutional:      General: She is not in acute distress.    Appearance: She is well-developed. She is not ill-appearing, toxic-appearing or diaphoretic.  HENT:     Head: Normocephalic and atraumatic.     Right Ear: External ear normal.     Left Ear: External ear normal.     Nose: Nose normal.     Mouth/Throat:     Pharynx: No oropharyngeal exudate.  Eyes:     Conjunctiva/sclera: Conjunctivae normal.     Pupils: Pupils are equal, round, and reactive to light.  Cardiovascular:     Rate and Rhythm: Normal rate.     Heart sounds: Normal heart sounds. No murmur heard.   Pulmonary:     Effort: Pulmonary effort is normal. No respiratory distress.     Breath sounds: No  stridor.  Abdominal:     General: Abdomen is flat. Bowel sounds are normal. There is no distension.     Tenderness: There is generalized abdominal tenderness. There is no right CVA tenderness, left CVA tenderness, guarding or rebound.  Musculoskeletal:     Cervical back: Normal range of motion and neck supple.  Skin:    General: Skin is warm.     Capillary Refill: Capillary refill takes less than 2 seconds.     Findings: No erythema or rash.  Neurological:     General: No focal deficit present.     Mental Status: She is alert and oriented to person, place, and time.     Motor: No abnormal muscle tone.     Coordination: Coordination normal.     Deep Tendon Reflexes: Reflexes are normal and symmetric.  Psychiatric:        Mood and Affect: Mood normal.     ED Results / Procedures / Treatments   Labs (all labs ordered are listed, but only abnormal results are displayed) Labs Reviewed  SARS CORONAVIRUS 2 BY RT PCR (HOSPITAL ORDER, [facility]) - Abnormal; Notable for the following components:      Result Value   SARS Coronavirus 2 POSITIVE (*)    All other components within normal limits  COMPREHENSIVE METABOLIC PANEL - Abnormal; Notable for the following components:   Glucose, Bld 107 (*)    All other components within normal limits  URINALYSIS, ROUTINE W REFLEX MICROSCOPIC - Abnormal; Notable for the following components:   Specific Gravity, Urine >1.030 (*)    All other components within normal limits  LIPASE, BLOOD  CBC    EKG None  Radiology CT ABDOMEN PELVIS W CONTRAST  Result Date: 10/10/2019 CLINICAL DATA:  Left lower quadrant pain. EXAM: CT ABDOMEN AND PELVIS WITH CONTRAST TECHNIQUE: Multidetector CT imaging of the abdomen and pelvis was performed using the standard protocol  following bolus administration of intravenous contrast. CONTRAST:  178mL OMNIPAQUE IOHEXOL 300 MG/ML  SOLN COMPARISON:  September 01, 2018 FINDINGS: Lower chest: Very mild  posterior bibasilar atelectasis is seen. Hepatobiliary: No focal liver abnormality is seen. No gallstones, gallbladder wall thickening, or biliary dilatation. Pancreas: Unremarkable. No pancreatic ductal dilatation or surrounding inflammatory changes. Spleen: Normal in size without focal abnormality. Adrenals/Urinary Tract: Adrenal glands are unremarkable. Kidneys are normal, without renal calculi, focal lesion, or hydronephrosis. Bladder is unremarkable. Stomach/Bowel: Stomach is within normal limits. Appendix appears normal. Surgically anastomosed bowel is seen within the posterior aspect of the right lower quadrant and within the region of the mid sigmoid colon. No evidence of bowel dilatation. Noninflamed diverticula are seen within the sigmoid colon. Vascular/Lymphatic: There is mild calcification of the abdominal aorta and bilateral common iliac arteries, without evidence of aneurysmal dilatation. No enlarged abdominal or pelvic lymph nodes. Reproductive: Status post hysterectomy. No adnexal masses. Other: No abdominal wall hernia or abnormality. No abdominopelvic ascites. Musculoskeletal: No acute or significant osseous findings. IMPRESSION: 1. Sigmoid diverticulosis without evidence of acute diverticulitis. 2. Aortic atherosclerosis. Aortic Atherosclerosis (ICD10-I70.0). Electronically Signed   By: Virgina Norfolk M.D.   On: 10/10/2019 16:06    Procedures Procedures (including critical care time)  Medications Ordered in ED Medications  morphine 4 MG/ML injection 4 mg (4 mg Intravenous Given 10/10/19 1536)  ondansetron (ZOFRAN) injection 4 mg (4 mg Intravenous Given 10/10/19 1534)  iohexol (OMNIPAQUE) 300 MG/ML solution 100 mL (100 mLs Intravenous Contrast Given 10/10/19 1544)   [name] was evaluated in Emergency Department on 10/10/2019 for the symptoms described in the history of present illness. She was evaluated in the context of the global COVID-19 pandemic, which necessitated  consideration that the patient might be at risk for infection with the SARS-CoV-2 virus that causes COVID-19. Institutional protocols and algorithms that pertain to the evaluation of patients at risk for COVID-19 are in a state of rapid change based on information released by regulatory bodies including the CDC and federal and state organizations. These policies and algorithms were followed during the patient's care in the ED.   ED Course  I have reviewed the triage vital signs and the nursing notes.  Pertinent labs & imaging results that were available during my care of the patient were reviewed by me and considered in my medical decision making (see chart for details).    MDM Rules/Calculators/A&P                          [name] is a 49 y.o. female with a past medical history significant for hypertension, dyslipidemia, prior diverticulitis status post partial bowel resection, ileostomy, and reversal who presents with fevers, chills, and abdominal pain.  She reports that her spouse was recently diagnosed with COVID-19 and she is unsure if she just has Covid or if she is having diverticulitis given the abdominal pain.  She does report some diarrhea and some nausea.  She reports no significant congestion, cough, chest pain, shortness of breath.  She denies any back or flank pain.  She denies rashes.  She reports that her abdominal pain has been moderate to severe.  On exam, abdomen is tender but I heard bowel sounds.  Breath sounds were clear and chest was nontender.  No rashes seen to suggest shingles.  Flanks nontender.  Patient resting comfortably.  Had a shared decision-making conversation patient after laboratory testing did show that she has COVID-19.  Patient  is still concerned about her bowel for diverticulitis given her prior surgery.  Patient had CT scan which did not show evidence of diverticulitis and confirmed diverticulosis.  She was feeling better after some  medications.  Given her normal oxygen saturation, I do not feel she needs admission for COVID-19 and we feel she is safe for discharge home.  Patient will follow up with her PCP and understood symptomatic management as an outpatient.  She understood return precautions and was discharged in good condition.      Final Clinical Impression(s) / ED Diagnoses Final diagnoses:  COVID-19  Abdominal pain, unspecified abdominal location    Rx / DC Orders ED Discharge Orders    None      Clinical Impression: 1. COVID-19   2. Abdominal pain, unspecified abdominal location     Disposition: Discharge  Condition: Good  I have discussed the results, Dx and Tx plan with the pt(& family if present). He/she/they expressed understanding and agree(s) with the plan. Discharge instructions discussed at great length. Strict return precautions discussed and pt &/or family have verbalized understanding of the instructions. No further questions at time of discharge.    New Prescriptions   No medications on file    Follow Up: Libby Maw, MD [place] [id number] [phone]     [facility]PARTMENT [address] [id number] QF DVOU [place] [phone]       Lucero Ide, Gwenyth Allegra, MD 10/10/19 [id number]

## 2019-10-10 NOTE — ED Notes (Signed)
Patient transported to CT 

## 2019-10-10 NOTE — ED Notes (Signed)
IV attempt x2 right arm, unsuccessful. RN aware.  Patient tolerated well.

## 2019-10-10 NOTE — ED Triage Notes (Signed)
Husband has COVID. Reports fever and LLQ pain with diarrhea since yesterday. Hx of diverticulitis.

## 2019-10-11 ENCOUNTER — Encounter: Payer: Self-pay | Admitting: Family Medicine

## 2019-10-11 DIAGNOSIS — M791 Myalgia, unspecified site: Secondary | ICD-10-CM

## 2019-10-11 MED ORDER — IBUPROFEN 600 MG PO TABS: 600.0000 mg | ORAL_TABLET | Freq: Three times a day (TID) | ORAL | 0 refills | Status: DC | PRN

## 2019-10-12 ENCOUNTER — Encounter: Payer: Self-pay | Admitting: Family Medicine

## 2019-10-12 ENCOUNTER — Telehealth (INDEPENDENT_AMBULATORY_CARE_PROVIDER_SITE_OTHER): Payer: 59 | Admitting: Family Medicine

## 2019-10-12 VITALS — Temp 100.6°F | Ht 64.0 in | Wt 219.2 lb

## 2019-10-12 DIAGNOSIS — U071 COVID-19: Secondary | ICD-10-CM | POA: Diagnosis not present

## 2019-10-12 NOTE — Progress Notes (Signed)
Also [name] is here needs the help disability form filled out   Established Patient Office Visit  Subjective:  Patient ID: [name], female    DOB: 24-Jul-1970  Age: 49 y.o. MRN: [id number]  CC:  Chief Complaint  Patient presents with   Advice Only    patiet needing disibilty forms filled out for work positive for COVID     HPI [name] presents for 5-day history of malaise, fatigue, myalgias and arthralgias stuffy nose and drainage.  There is little cough with no wheezing or shortness of breath.  Reports arthralgias and myalgias.  Status post diarrhea that has resolved.  There is been no cough or loss of taste and smell.  Patient denies shortness of breath or wheezing.  Seen in the emergency room on the 22nd where she was diagnosed with Covid.  Past Medical History:  Diagnosis Date   Abnormal Pap smear 2000   [facility] Biopsy   Allergy    Arthritis    hands   Back pain    Blood transfusion without reported diagnosis 2005   Breast fibroadenoma    Diverticulitis of colon with perforation 05/20/2014   GERD (gastroesophageal reflux disease)    H/O metrorrhagia 12/2006   H/O myomectomy 07-2006   robotic   H/O sinusitis    Headache(784.0)    Heart murmur    History of conization of cervix 2000   History of ectopic pregnancy 2005   r salpyngectomy   History of hysterectomy, supracervical 08-2008   robotic   History of syphilis    History of syphilis    Hx of adenomatous polyp of colon 12/31/2017   Hx of herpes simplex type 2 infection    Hyperlipidemia    Hypertension    Ileostomy present ([place]) 10/26/2014   Intra-abdominal abscess ([place]) 07/21/2014   PMS (premenstrual syndrome)     Past Surgical History:  Procedure Laterality Date   ABDOMINAL HYSTERECTOMY     BREAST LUMPECTOMY  10/11   rt-neg   BREAST SURGERY  2004   fibroademoma   CERVICAL CONE BIOPSY  2000   COLON RESECTION N/A 07/06/2014   Procedure: LAPAROSCOPIC LOOP ILEOSTOMY WITH  PLACEMENT OF PELVIC DRAIN;  Surgeon: Donnie Mesa, MD;  Location: [facility];  Service: General;  Laterality: N/A;   COLON SURGERY     diverticulitis    COLONOSCOPY     DILATION AND CURETTAGE OF UTERUS  2009   DIVERTING ILEOSTOMY Right 06/2014   ECTOPIC PREGNANCY SURGERY  2005   R salpyngectomy   ILEOSTOMY N/A 10/26/2014   Procedure: LOOP ILEOSTOMY REVERSAL;  Surgeon: Donnie Mesa, MD;  Location: [facility];  Service: General;  Laterality: N/A;   LAPAROSCOPIC SIGMOID COLECTOMY N/A 06/30/2014   Procedure: LAPAROSCOPIC ASSISTED SIGMOID COLECTOMY;  Surgeon: Donnie Mesa, MD;  Location: [facility];  Service: General;  Laterality: N/A;   MYOMECTOMY  2008   robotic   POLYPECTOMY  2009   ROBOTIC ASSISTED LAP VAGINAL HYSTERECTOMY  2010   supracervical   SALPINGECTOMY  2005   TRIGGER FINGER RELEASE Right 09/28/2012   Procedure: RELEASE TRIGGER FINGER/A-1 PULLEY RIGHT THUMB;  Surgeon: Jolyn Nap, MD;  Location: [facility];  Service: Orthopedics;  Laterality: Right;   ULNAR NERVE TRANSPOSITION Right 12/01/2018   Procedure: ULNAR NERVE DECOMPRESSION;  Surgeon: Daryll Brod, MD;  Location: [facility];  Service: Orthopedics;  Laterality: Right;  AXILLARY    Family History  Problem Relation Age of Onset   Hypertension Paternal Grandmother  Diabetes Paternal Grandmother    Hypertension Maternal Grandmother    Sarcoidosis Mother    Hypertension Mother    Hyperlipidemia Mother    Thyroid disease Maternal Aunt    Mental illness Cousin    Fibroids Sister    Colon cancer Neg Hx    Colon polyps Neg Hx    Esophageal cancer Neg Hx    Rectal cancer Neg Hx    Stomach cancer Neg Hx     Social History   Socioeconomic History   Marital status: Married    Spouse name: [name]   Number of children: 2   Years of education: Not on file   Highest education level: Associate degree: occupational, Hotel manager, or vocational program  Occupational History    Occupation: CMA    Comment: [employer] at [employer] Use   Smoking status: Former Smoker    Packs/day: 1.00    Years: 20.00    Pack years: 20.00    Types: Cigarettes    Quit date: 06/13/2011    Years since quitting: 8.3   Smokeless tobacco: Never Used  Vaping Use   Vaping Use: Never used  Substance and Sexual Activity   Alcohol use: Not Currently   Drug use: No   Sexual activity: Yes  Other Topics Concern   Not on file  Social History Narrative   Lives with her husband and their son.   Social Determinants of Health   Financial Resource Strain:    Difficulty of Paying Living Expenses: Not on file  Food Insecurity:    Worried About Charity fundraiser in the Last Year: Not on file   [employer] of Food in the Last Year: Not on file  Transportation Needs:    Lack of Transportation (Medical): Not on file   Lack of Transportation (Non-Medical): Not on file  Physical Activity:    Days of Exercise per Week: Not on file   Minutes of Exercise per Session: Not on file  Stress:    Feeling of Stress : Not on file  Social Connections:    Frequency of Communication with Friends and Family: Not on file   Frequency of Social Gatherings with Friends and Family: Not on file   Attends Religious Services: Not on file   Active Member of Clubs or Organizations: Not on file   Attends Archivist Meetings: Not on file   Marital Status: Not on file  Intimate Partner Violence:    Fear of Current or Ex-Partner: Not on file   Emotionally Abused: Not on file   Physically Abused: Not on file   Sexually Abused: Not on file    Outpatient Medications Prior to Visit  Medication Sig Dispense Refill   amLODipine (NORVASC) 5 MG tablet Take 1 tablet (5 mg total) by mouth daily. 90 tablet 0   atorvastatin (LIPITOR) 20 MG tablet Take 1 tablet (20 mg total) by mouth daily. 90 tablet 0   Black Cohosh (REMIFEMIN) 20 MG TABS Take 40 mg by mouth daily.     ibuprofen  (ADVIL) 600 MG tablet Take 1 tablet (600 mg total) by mouth every 8 (eight) hours as needed. 15 tablet 0   lisinopril (ZESTRIL) 10 MG tablet Take 1 tablet (10 mg total) by mouth daily. 90 tablet 0   Multiple Vitamins-Minerals (HAIR/SKIN/NAILS/BIOTIN PO) Take 1 tablet by mouth daily.     nitroGLYCERIN (NITROSTAT) 0.4 MG SL tablet Place 1 tablet (0.4 mg total) under the tongue every 5 (five) minutes  as needed for up to 25 days for chest pain. 25 tablet 1   Facility-Administered Medications Prior to Visit  Medication Dose Route Frequency Provider Last Rate Last Admin   0.9 %  sodium chloride infusion  500 mL Intravenous Once Mansouraty, Telford Nab., MD        Allergies  Allergen Reactions   Keflex [Cephalexin] Anaphylaxis   Penicillins Anaphylaxis    Has patient had a PCN reaction causing immediate rash, facial/tongue/throat swelling, SOB or lightheadedness with hypotension: Yes Has patient had a PCN reaction causing severe rash involving mucus membranes or skin necrosis: No Has patient had a PCN reaction that required hospitalization: No Has patient had a PCN reaction occurring within the last 10 years: No If all of the above answers are "NO", then may proceed with Cephalosporin use.     ROS Review of Systems  Constitutional: Positive for fatigue. Negative for chills, diaphoresis, fever and unexpected weight change.  HENT: Positive for congestion and sinus pressure. Negative for postnasal drip.   Eyes: Negative for photophobia and visual disturbance.  Respiratory: Negative for cough and shortness of breath.   Cardiovascular: Negative.   Gastrointestinal: Negative for abdominal pain, anal bleeding and blood in stool.  Endocrine: Negative for polyphagia and polyuria.  Genitourinary: Negative.   Musculoskeletal: Positive for arthralgias and myalgias.  Allergic/Immunologic: Negative for immunocompromised state.  Hematological: Does not bruise/bleed easily.  Psychiatric/Behavioral:  Negative.       Objective:    Physical Exam Vitals and nursing note reviewed.  Constitutional:      General: She is not in acute distress.    Appearance: Normal appearance. She is ill-appearing. She is not toxic-appearing or diaphoretic.  HENT:     Head: Normocephalic and atraumatic.     Right Ear: External ear normal.     Left Ear: External ear normal.  Eyes:     General: No scleral icterus.       Right eye: No discharge.        Left eye: No discharge.     Conjunctiva/sclera: Conjunctivae normal.  Pulmonary:     Effort: Pulmonary effort is normal.  Neurological:     Mental Status: She is alert and oriented to person, place, and time.  Psychiatric:        Mood and Affect: Mood normal.        Behavior: Behavior normal.     Temp (!) 100.6 F (38.1 C) (Oral)    Ht 5\' 4"  (1.626 m)    Wt 219 lb 3.2 oz (99.4 kg)    BMI 37.63 kg/m  Wt Readings from Last 3 Encounters:  10/12/19 219 lb 3.2 oz (99.4 kg)  10/10/19 221 lb (100.2 kg)  10/04/19 221 lb 4.8 oz (100.4 kg)     Health Maintenance Due  Topic Date Due   Hepatitis C Screening  Never done   PAP SMEAR-Modifier  06/19/2019   INFLUENZA VACCINE  09/19/2019    There are no preventive care reminders to display for this patient.  Lab Results  Component Value Date   TSH 1.14 10/04/2019   Lab Results  Component Value Date   WBC 6.1 10/10/2019   HGB 13.7 10/10/2019   HCT 43.3 10/10/2019   MCV 92.5 10/10/2019   PLT 313 10/10/2019   Lab Results  Component Value Date   NA 138 10/10/2019   K 4.1 10/10/2019   CO2 25 10/10/2019   GLUCOSE 107 (H) 10/10/2019   BUN 10 10/10/2019   CREATININE 0.82  10/10/2019   BILITOT 0.5 10/10/2019   ALKPHOS 52 10/10/2019   AST 28 10/10/2019   ALT 28 10/10/2019   PROT 8.0 10/10/2019   ALBUMIN 4.3 10/10/2019   CALCIUM 9.5 10/10/2019   ANIONGAP 9 10/10/2019   GFR 80.02 10/04/2019   Lab Results  Component Value Date   CHOL 178 10/04/2019   Lab Results  Component Value Date     HDL 67.80 10/04/2019   Lab Results  Component Value Date   LDLCALC 97 10/04/2019   Lab Results  Component Value Date   TRIG 65.0 10/04/2019   Lab Results  Component Value Date   CHOLHDL 3 10/04/2019   Lab Results  Component Value Date   HGBA1C 5.9 10/04/2019      Assessment & Plan:   Problem List Items Addressed This Visit      Other   COVID-19 - Primary      No orders of the defined types were placed in this encounter.   Follow-up: Return in about 1 week (around 10/19/2019), or if symptoms worsen or fail to improve.   Continue rest, copious fluids, Tylenol and ibuprofen as needed.  Follow-up in 1 week if not improving.  Back to work on 1 September.  Advised patient to go and have the Covid vaccine as soon as she is able. Libby Maw, MD   Virtual Visit via Video Note  I connected with [name] on 10/12/19 at  2:30 PM EDT by a video enabled telemedicine application and verified that I am speaking with the correct person using two identifiers.  Location: Patient: home with her husband and child. Provider:    I discussed the limitations of evaluation and management by telemedicine and the availability of in person appointments. The patient expressed understanding and agreed to proceed.  History of Present Illness:    Observations/Objective:   Assessment and Plan:   Follow Up Instructions:    I discussed the assessment and treatment plan with the patient. The patient was provided an opportunity to ask questions and all were answered. The patient agreed with the plan and demonstrated an understanding of the instructions.   The patient was advised to call back or seek an in-person evaluation if the symptoms worsen or if the condition fails to improve as anticipated.  I provided 23 minutes of non-face-to-face time during this encounter.   Libby Maw, MD

## 2019-10-14 ENCOUNTER — Encounter: Payer: Self-pay | Admitting: Family Medicine

## 2019-10-14 DIAGNOSIS — R110 Nausea: Secondary | ICD-10-CM

## 2019-10-14 MED ORDER — PROMETHAZINE HCL 25 MG PO TABS: 25.0000 mg | ORAL_TABLET | Freq: Three times a day (TID) | ORAL | 0 refills | Status: DC | PRN

## 2019-10-14 NOTE — Telephone Encounter (Signed)
Sent phenergan to pharmacy.

## 2019-10-17 ENCOUNTER — Emergency Department (HOSPITAL_COMMUNITY): Payer: 59

## 2019-10-17 ENCOUNTER — Inpatient Hospital Stay (HOSPITAL_COMMUNITY)
Admission: EM | Admit: 2019-10-17 | Discharge: 2019-10-18 | DRG: 179 | Disposition: A | Discharge status: Home / Self Care | Payer: 59 | Attending: Internal Medicine | Admitting: Internal Medicine

## 2019-10-17 ENCOUNTER — Other Ambulatory Visit: Payer: Self-pay

## 2019-10-17 ENCOUNTER — Encounter (HOSPITAL_COMMUNITY): Payer: Self-pay

## 2019-10-17 DIAGNOSIS — Z83438 Family history of other disorder of lipoprotein metabolism and other lipidemia: Secondary | ICD-10-CM | POA: Diagnosis not present

## 2019-10-17 DIAGNOSIS — Z8249 Family history of ischemic heart disease and other diseases of the circulatory system: Secondary | ICD-10-CM | POA: Diagnosis not present

## 2019-10-17 DIAGNOSIS — E6609 Other obesity due to excess calories: Secondary | ICD-10-CM | POA: Diagnosis present

## 2019-10-17 DIAGNOSIS — I10 Essential (primary) hypertension: Secondary | ICD-10-CM | POA: Diagnosis not present

## 2019-10-17 DIAGNOSIS — E860 Dehydration: Secondary | ICD-10-CM | POA: Diagnosis present

## 2019-10-17 DIAGNOSIS — E782 Mixed hyperlipidemia: Secondary | ICD-10-CM | POA: Diagnosis not present

## 2019-10-17 DIAGNOSIS — E785 Hyperlipidemia, unspecified: Secondary | ICD-10-CM

## 2019-10-17 DIAGNOSIS — Z6838 Body mass index (BMI) 38.0-38.9, adult: Secondary | ICD-10-CM | POA: Diagnosis present

## 2019-10-17 DIAGNOSIS — Z79899 Other long term (current) drug therapy: Secondary | ICD-10-CM | POA: Diagnosis not present

## 2019-10-17 DIAGNOSIS — R0902 Hypoxemia: Secondary | ICD-10-CM

## 2019-10-17 DIAGNOSIS — Z87891 Personal history of nicotine dependence: Secondary | ICD-10-CM | POA: Diagnosis not present

## 2019-10-17 DIAGNOSIS — E669 Obesity, unspecified: Secondary | ICD-10-CM | POA: Diagnosis present

## 2019-10-17 DIAGNOSIS — Z6837 Body mass index (BMI) 37.0-37.9, adult: Secondary | ICD-10-CM

## 2019-10-17 DIAGNOSIS — E66812 Obesity, class 2: Secondary | ICD-10-CM | POA: Diagnosis present

## 2019-10-17 DIAGNOSIS — E7800 Pure hypercholesterolemia, unspecified: Secondary | ICD-10-CM | POA: Diagnosis present

## 2019-10-17 DIAGNOSIS — U071 COVID-19: Principal | ICD-10-CM | POA: Diagnosis present

## 2019-10-17 DIAGNOSIS — Z833 Family history of diabetes mellitus: Secondary | ICD-10-CM | POA: Diagnosis not present

## 2019-10-17 DIAGNOSIS — R0602 Shortness of breath: Secondary | ICD-10-CM

## 2019-10-17 DIAGNOSIS — J9601 Acute respiratory failure with hypoxia: Secondary | ICD-10-CM | POA: Diagnosis not present

## 2019-10-17 LAB — COMPREHENSIVE METABOLIC PANEL
ALT: 22 U/L (ref 0–44)
AST: 33 U/L (ref 15–41)
Albumin: 4.2 g/dL (ref 3.5–5.0)
Alkaline Phosphatase: 50 U/L (ref 38–126)
Anion gap: 14 (ref 5–15)
BUN: 8 mg/dL (ref 6–20)
CO2: 25 mmol/L (ref 22–32)
Calcium: 8.9 mg/dL (ref 8.9–10.3)
Chloride: 100 mmol/L (ref 98–111)
Creatinine, Ser: 0.88 mg/dL (ref 0.44–1.00)
GFR calc Af Amer: >60 mL/min (ref >60)
GFR calc non Af Amer: >60 mL/min (ref >60)
Glucose, Bld: 131 mg/dL — ABNORMAL HIGH (ref 70–99)
Potassium: 3.6 mmol/L (ref 3.5–5.1)
Sodium: 139 mmol/L (ref 135–145)
Total Bilirubin: 0.8 mg/dL (ref 0.3–1.2)
Total Protein: 7.4 g/dL (ref 6.5–8.1)

## 2019-10-17 LAB — CBC WITH DIFFERENTIAL/PLATELET
Abs Immature Granulocytes: 0.03 10*3/uL (ref 0.00–0.07)
Basophils Absolute: 0 10*3/uL (ref 0.0–0.1)
Basophils Relative: 0 %
Eosinophils Absolute: 0 10*3/uL (ref 0.0–0.5)
Eosinophils Relative: 0 %
HCT: 42 % (ref 36.0–46.0)
Hemoglobin: 13.5 g/dL (ref 12.0–15.0)
Immature Granulocytes: 1 %
Lymphocytes Relative: 22 %
Lymphs Abs: 1.5 10*3/uL (ref 0.7–4.0)
MCH: 29.2 pg (ref 26.0–34.0)
MCHC: 32.1 g/dL (ref 30.0–36.0)
MCV: 90.9 fL (ref 80.0–100.0)
Monocytes Absolute: 0.6 10*3/uL (ref 0.1–1.0)
Monocytes Relative: 9 %
Neutro Abs: 4.5 10*3/uL (ref 1.7–7.7)
Neutrophils Relative %: 68 %
Platelets: 258 10*3/uL (ref 150–400)
RBC: 4.62 MIL/uL (ref 3.87–5.11)
RDW: 13.1 % (ref 11.5–15.5)
WBC: 6.6 10*3/uL (ref 4.0–10.5)
nRBC: 0 % (ref 0.0–0.2)

## 2019-10-17 LAB — LACTATE DEHYDROGENASE: LDH: 294 U/L — ABNORMAL HIGH (ref 98–192)

## 2019-10-17 LAB — C-REACTIVE PROTEIN: CRP: 7.1 mg/dL — ABNORMAL HIGH (ref <1.0)

## 2019-10-17 LAB — I-STAT BETA HCG BLOOD, ED (MC, WL, AP ONLY): I-stat hCG, quantitative: <5 m[IU]/mL (ref <5)

## 2019-10-17 LAB — ABO/RH: ABO/RH(D): A POS

## 2019-10-17 LAB — FIBRINOGEN: Fibrinogen: 543 mg/dL — ABNORMAL HIGH (ref 210–475)

## 2019-10-17 LAB — LACTIC ACID, PLASMA
Lactic Acid, Venous: 1.3 mmol/L (ref 0.5–1.9)
Lactic Acid, Venous: 1.3 mmol/L (ref 0.5–1.9)

## 2019-10-17 LAB — D-DIMER, QUANTITATIVE: D-Dimer, Quant: 0.37 ug/mL-FEU (ref 0.00–0.50)

## 2019-10-17 LAB — TRIGLYCERIDES: Triglycerides: 85 mg/dL (ref <150)

## 2019-10-17 LAB — PROCALCITONIN: Procalcitonin: <0.1 ng/mL

## 2019-10-17 LAB — FERRITIN: Ferritin: 475 ng/mL — ABNORMAL HIGH (ref 11–307)

## 2019-10-17 LAB — HIV ANTIBODY (ROUTINE TESTING W REFLEX): HIV Screen 4th Generation wRfx: NONREACTIVE

## 2019-10-17 MED ORDER — ALBUTEROL SULFATE HFA 108 (90 BASE) MCG/ACT IN AERS
2.0000 | INHALATION_SPRAY | Freq: Once | RESPIRATORY_TRACT | Status: AC
  Administered 2019-10-17: 2 via RESPIRATORY_TRACT
  Filled 2019-10-17: qty 6.7

## 2019-10-17 MED ORDER — ATORVASTATIN CALCIUM 10 MG PO TABS
20.0000 mg | ORAL_TABLET | Freq: Every day | ORAL | Status: DC
  Administered 2019-10-17 – 2019-10-18 (×2): 20 mg via ORAL
  Filled 2019-10-17 (×2): qty 2

## 2019-10-17 MED ORDER — ALBUTEROL SULFATE HFA 108 (90 BASE) MCG/ACT IN AERS
2.0000 | INHALATION_SPRAY | Freq: Four times a day (QID) | RESPIRATORY_TRACT | Status: DC
  Administered 2019-10-17 – 2019-10-18 (×5): 2 via RESPIRATORY_TRACT

## 2019-10-17 MED ORDER — AEROCHAMBER Z-STAT PLUS/MEDIUM MISC
1.0000 | Freq: Once | Status: AC
  Administered 2019-10-17: 1

## 2019-10-17 MED ORDER — PROMETHAZINE HCL 25 MG PO TABS
25.0000 mg | ORAL_TABLET | Freq: Once | ORAL | Status: AC
  Administered 2019-10-17: 25 mg via ORAL
  Filled 2019-10-17: qty 1

## 2019-10-17 MED ORDER — ONDANSETRON HCL 4 MG PO TABS: 4.0000 mg | ORAL_TABLET | Freq: Four times a day (QID) | ORAL | Status: DC | PRN

## 2019-10-17 MED ORDER — DEXAMETHASONE SODIUM PHOSPHATE 10 MG/ML IJ SOLN
6.0000 mg | Freq: Every day | INTRAMUSCULAR | Status: DC
  Administered 2019-10-17: 6 mg via INTRAVENOUS
  Filled 2019-10-17: qty 1

## 2019-10-17 MED ORDER — SODIUM CHLORIDE 0.9 % IV SOLN
200.0000 mg | Freq: Once | INTRAVENOUS | Status: AC
  Administered 2019-10-17: 200 mg via INTRAVENOUS
  Filled 2019-10-17: qty 200

## 2019-10-17 MED ORDER — GUAIFENESIN-DM 100-10 MG/5ML PO SYRP: 10.0000 mL | ORAL_SOLUTION | ORAL | Status: DC | PRN

## 2019-10-17 MED ORDER — ACETAMINOPHEN 325 MG PO TABS
650.0000 mg | ORAL_TABLET | Freq: Four times a day (QID) | ORAL | Status: DC | PRN
  Administered 2019-10-18: 650 mg via ORAL
  Filled 2019-10-17: qty 2

## 2019-10-17 MED ORDER — ONDANSETRON HCL 4 MG/2ML IJ SOLN: 4.0000 mg | Freq: Four times a day (QID) | INTRAMUSCULAR | Status: DC | PRN

## 2019-10-17 MED ORDER — ACETAMINOPHEN 650 MG RE SUPP: 650.0000 mg | Freq: Four times a day (QID) | RECTAL | Status: DC | PRN

## 2019-10-17 MED ORDER — LISINOPRIL 10 MG PO TABS
10.0000 mg | ORAL_TABLET | Freq: Every day | ORAL | Status: DC
  Administered 2019-10-18: 10 mg via ORAL
  Filled 2019-10-17: qty 1

## 2019-10-17 MED ORDER — ZINC SULFATE 220 (50 ZN) MG PO CAPS
220.0000 mg | ORAL_CAPSULE | Freq: Every day | ORAL | Status: DC
  Administered 2019-10-17 – 2019-10-18 (×2): 220 mg via ORAL
  Filled 2019-10-17 (×2): qty 1

## 2019-10-17 MED ORDER — SODIUM CHLORIDE 0.9 % IV BOLUS
1000.0000 mL | Freq: Once | INTRAVENOUS | Status: AC
  Administered 2019-10-17: 1000 mL via INTRAVENOUS

## 2019-10-17 MED ORDER — ENOXAPARIN SODIUM 40 MG/0.4ML ~~LOC~~ SOLN
40.0000 mg | SUBCUTANEOUS | Status: DC
  Administered 2019-10-17: 40 mg via SUBCUTANEOUS
  Filled 2019-10-17: qty 0.4

## 2019-10-17 MED ORDER — AMLODIPINE BESYLATE 5 MG PO TABS
5.0000 mg | ORAL_TABLET | Freq: Every day | ORAL | Status: DC
  Administered 2019-10-18: 5 mg via ORAL
  Filled 2019-10-17: qty 1

## 2019-10-17 MED ORDER — ASCORBIC ACID 500 MG PO TABS
500.0000 mg | ORAL_TABLET | Freq: Every day | ORAL | Status: DC
  Administered 2019-10-17 – 2019-10-18 (×2): 500 mg via ORAL
  Filled 2019-10-17 (×2): qty 1

## 2019-10-17 MED ORDER — SODIUM CHLORIDE 0.9 % IV SOLN
100.0000 mg | Freq: Every day | INTRAVENOUS | Status: DC
  Administered 2019-10-18: 100 mg via INTRAVENOUS
  Filled 2019-10-17: qty 20

## 2019-10-17 MED ORDER — HYDROCOD POLST-CPM POLST ER 10-8 MG/5ML PO SUER: 5.0000 mL | Freq: Two times a day (BID) | ORAL | Status: DC | PRN

## 2019-10-17 MED ORDER — ACETAMINOPHEN 500 MG PO TABS
1000.0000 mg | ORAL_TABLET | Freq: Once | ORAL | Status: AC
  Administered 2019-10-17: 1000 mg via ORAL
  Filled 2019-10-17: qty 2

## 2019-10-17 NOTE — ED Provider Notes (Signed)
[facility] DEPT Provider Note   CSN: [id number] Arrival date & time: 10/17/19  0035     History Chief Complaint  Patient presents with  . Shortness of Breath  . Emesis  . COVID Positive    [name] is a 49 y.o. female.  HPI   49 year old female with a history of breast fibroadenoma, diverticulitis, GERD, heart murmur, ectopic pregnancy, hysterectomy, history of syphilis, HSV, hypertension, who presents to the emergency department today for evaluation of persistent Covid symptoms.  Patient states she became symptomatic of Covid 8 days ago.  She was diagnosed with Covid last week.  She has been experiencing nausea, vomiting, diarrhea, cough, shortness of breath and fevers.  She is mainly here today because she has been unable to tolerate p.o. for the last several days and she feels more short of breath.  She denies any chest pain.  She reports some burning abdominal pain which she states is unchanged from previous and does not feel consistent with her prior history of diverticulitis.  Of note, patient was seen in the ED 1 week ago for evaluation of nausea vomiting diarrhea and concern for diverticulitis.  She had a CT abdomen/pelvis at that time which did not show evidence of diverticulitis.  She was diagnosed with Covid at that time.  Past Medical History:  Diagnosis Date  . Abnormal Pap smear 2000   [facility] Biopsy  . Allergy   . Arthritis    hands  . Back pain   . Blood transfusion without reported diagnosis 2005  . Breast fibroadenoma   . Diverticulitis of colon with perforation 05/20/2014  . GERD (gastroesophageal reflux disease)   . H/O metrorrhagia 12/2006  . H/O myomectomy 07-2006   robotic  . H/O sinusitis   . Headache(784.0)   . Heart murmur   . History of conization of cervix 2000  . History of ectopic pregnancy 2005   r salpyngectomy  . History of hysterectomy, supracervical 08-2008   robotic  . History of syphilis   . History of  syphilis   . Hx of adenomatous polyp of colon 12/31/2017  . Hx of herpes simplex type 2 infection   . Hyperlipidemia   . Hypertension   . Ileostomy present ([place]) 10/26/2014  . Intra-abdominal abscess ([place]) 07/21/2014  . PMS (premenstrual syndrome)     Patient Active Problem List   Diagnosis Date Noted  . COVID-19 10/12/2019  . Left leg pain 10/04/2019  . Elevated glucose 10/08/2018  . Elevated LDL cholesterol level 10/08/2018  . Healthcare maintenance 10/08/2018  . Need for immunization against influenza 10/08/2018  . Obesity (BMI 30-39.9) 03/29/2018  . Acute chest pain 03/27/2018  . Nonspecific abnormal electrocardiogram (ECG) (EKG) 03/27/2018  . Atypical chest pain 03/27/2018  . Hand pain, right 03/25/2018  . Dyslipidemia 08/15/2017  . History of hysterectomy, supracervical 02/14/2017  . Trigger ring finger of right hand 08/07/2015  . Primary osteoarthritis of first carpometacarpal joint of right hand 08/07/2015  . Diverticulitis of sigmoid colon s/p colectomy 06/30/2014 07/26/2014  . Tachycardia 02/04/2014  . Hx of adenomatous polyp of colon 12/23/2013  . Essential hypertension 11/02/2013  . Former smoker 11/26/2008    Past Surgical History:  Procedure Laterality Date  . ABDOMINAL HYSTERECTOMY    . BREAST LUMPECTOMY  10/11   rt-neg  . BREAST SURGERY  2004   fibroademoma  . CERVICAL CONE BIOPSY  2000  . COLON RESECTION N/A 07/06/2014   Procedure: LAPAROSCOPIC LOOP ILEOSTOMY WITH PLACEMENT OF  PELVIC DRAIN;  Surgeon: Donnie Mesa, MD;  Location: [facility];  Service: General;  Laterality: N/A;  . COLON SURGERY     diverticulitis   . COLONOSCOPY    . DILATION AND CURETTAGE OF UTERUS  2009  . DIVERTING ILEOSTOMY Right 06/2014  . ECTOPIC PREGNANCY SURGERY  2005   R salpyngectomy  . ILEOSTOMY N/A 10/26/2014   Procedure: LOOP ILEOSTOMY REVERSAL;  Surgeon: Donnie Mesa, MD;  Location: [facility];  Service: General;  Laterality: N/A;  . LAPAROSCOPIC SIGMOID COLECTOMY N/A 06/30/2014    Procedure: LAPAROSCOPIC ASSISTED SIGMOID COLECTOMY;  Surgeon: Donnie Mesa, MD;  Location: [facility];  Service: General;  Laterality: N/A;  . MYOMECTOMY  2008   robotic  . POLYPECTOMY  2009  . ROBOTIC ASSISTED LAP VAGINAL HYSTERECTOMY  2010   supracervical  . SALPINGECTOMY  2005  . TRIGGER FINGER RELEASE Right 09/28/2012   Procedure: RELEASE TRIGGER FINGER/A-1 PULLEY RIGHT THUMB;  Surgeon: Jolyn Nap, MD;  Location: [facility];  Service: Orthopedics;  Laterality: Right;  . ULNAR NERVE TRANSPOSITION Right 12/01/2018   Procedure: ULNAR NERVE DECOMPRESSION;  Surgeon: Daryll Brod, MD;  Location: [facility];  Service: Orthopedics;  Laterality: Right;  AXILLARY     OB History    Gravida  4   Para  2   Term  2   Preterm      AB  2   Living  2     SAB  0   TAB  1   Ectopic  1   Multiple  0   Live Births              Family History  Problem Relation Age of Onset  . Hypertension Paternal Grandmother   . Diabetes Paternal Grandmother   . Hypertension Maternal Grandmother   . Sarcoidosis Mother   . Hypertension Mother   . Hyperlipidemia Mother   . Thyroid disease Maternal Aunt   . Mental illness Cousin   . Fibroids Sister   . Colon cancer Neg Hx   . Colon polyps Neg Hx   . Esophageal cancer Neg Hx   . Rectal cancer Neg Hx   . Stomach cancer Neg Hx     Social History   Tobacco Use  . Smoking status: Former Smoker    Packs/day: 1.00    Years: 20.00    Pack years: 20.00    Types: Cigarettes    Quit date: 06/13/2011    Years since quitting: 8.3  . Smokeless tobacco: Never Used  Vaping Use  . Vaping Use: Never used  Substance Use Topics  . Alcohol use: Not Currently  . Drug use: No    Home Medications Prior to Admission medications   Medication Sig Start Date End Date Taking? Authorizing Provider  amLODipine (NORVASC) 5 MG tablet Take 1 tablet (5 mg total) by mouth daily. 09/07/19   Libby Maw, MD  atorvastatin  (LIPITOR) 20 MG tablet Take 1 tablet (20 mg total) by mouth daily. 09/07/19   Libby Maw, MD  Black Cohosh (REMIFEMIN) 20 MG TABS Take 40 mg by mouth daily.    [provider]  ibuprofen (ADVIL) 600 MG tablet Take 1 tablet (600 mg total) by mouth every 8 (eight) hours as needed. 10/11/19   Libby Maw, MD  lisinopril (ZESTRIL) 10 MG tablet Take 1 tablet (10 mg total) by mouth daily. 09/07/19   Libby Maw, MD  Multiple Vitamins-Minerals (HAIR/SKIN/NAILS/BIOTIN PO) Take 1  tablet by mouth daily.    [provider]  nitroGLYCERIN (NITROSTAT) 0.4 MG SL tablet Place 1 tablet (0.4 mg total) under the tongue every 5 (five) minutes as needed for up to 25 days for chest pain. 09/06/19 10/01/19  Adrian Prows, MD  promethazine (PHENERGAN) 25 MG tablet Take 1 tablet (25 mg total) by mouth every 8 (eight) hours as needed for nausea or vomiting. 10/14/19   Libby Maw, MD    Allergies    Keflex [cephalexin] and Penicillins  Review of Systems   Review of Systems  Constitutional: Positive for appetite change and fever.  HENT: Negative for ear pain and sore throat.   Eyes: Negative for pain and visual disturbance.  Respiratory: Positive for cough and shortness of breath.   Cardiovascular: Negative for chest pain.  Gastrointestinal: Positive for abdominal pain (mild burning pain), diarrhea, nausea and vomiting.  Genitourinary: Negative for dysuria and hematuria.  Musculoskeletal: Positive for myalgias.  Skin: Negative for rash.  Neurological: Negative for seizures and syncope.  All other systems reviewed and are negative.   Physical Exam Updated Vital Signs BP (!) 146/126 (BP Location: Left Arm)   Pulse (!) 120   Temp 98.2 F (36.8 C) (Oral)   Resp 16   Ht 5\' 4"  (1.626 m)   Wt 100.2 kg   SpO2 97%   BMI 37.93 kg/m   Physical Exam Vitals and nursing note reviewed.  Constitutional:      General: She is not in acute distress.    Appearance:  She is well-developed.  HENT:     Head: Normocephalic and atraumatic.     Mouth/Throat:     Mouth: Mucous membranes are dry.  Eyes:     Conjunctiva/sclera: Conjunctivae normal.  Cardiovascular:     Rate and Rhythm: Regular rhythm. Tachycardia present.     Heart sounds: No murmur heard.   Pulmonary:     Effort: Pulmonary effort is normal. No respiratory distress.     Breath sounds: Normal breath sounds. No decreased breath sounds, wheezing, rhonchi or rales.  Abdominal:     General: Bowel sounds are normal.     Palpations: Abdomen is soft.     Tenderness: There is no abdominal tenderness. There is no guarding or rebound.  Musculoskeletal:     Cervical back: Neck supple.     Right lower leg: No edema.     Left lower leg: No edema.  Skin:    General: Skin is warm and dry.  Neurological:     Mental Status: She is alert.     ED Results / Procedures / Treatments   Labs (all labs ordered are listed, but only abnormal results are displayed) Labs Reviewed  COMPREHENSIVE METABOLIC PANEL - Abnormal; Notable for the following components:      Result Value   Glucose, Bld 131 (*)    All other components within normal limits  CBC WITH DIFFERENTIAL/PLATELET    EKG EKG Interpretation  Date/Time:  Sunday October 17 2019 01:07:20 EDT Ventricular Rate:  101 PR Interval:    QRS Duration: 80 QT Interval:  361 QTC Calculation: 468 R Axis:   74 Text Interpretation: Sinus tachycardia Probable left atrial enlargement RSR' in V1 or V2, probably normal variant When compared with ECG of 03/28/2018, No significant change was found Confirmed by Delora Fuel ([id number]) on 10/17/2019 1:22:51 AM   Radiology DG Chest Port 1 View  Result Date: 10/17/2019 CLINICAL DATA:  COVID positive EXAM: PORTABLE CHEST 1 VIEW COMPARISON:  04/21/2018 FINDINGS: Possible subtle peripheral infiltrate in the left mid and upper lung. No consolidation or effusion. Normal heart size. No pneumothorax. IMPRESSION: Possible  subtle peripheral infiltrate in the left mid to upper lung. Electronically Signed   By: Donavan Foil M.D.   On: 10/17/2019 01:54    Procedures Procedures (including critical care time)  CRITICAL CARE Performed by: Rodney Booze   Total critical care time: 33 minutes  Critical care time was exclusive of separately billable procedures and treating other patients.  Critical care was necessary to treat or prevent imminent or life-threatening deterioration.  Critical care was time spent personally by me on the following activities: development of treatment plan with patient and/or surrogate as well as nursing, discussions with consultants, evaluation of patient's response to treatment, examination of patient, obtaining history from patient or surrogate, ordering and performing treatments and interventions, ordering and review of laboratory studies, ordering and review of radiographic studies, pulse oximetry and re-evaluation of patient's condition.   Medications Ordered in ED Medications  acetaminophen (TYLENOL) tablet 1,000 mg (1,000 mg Oral Given 10/17/19 1135)  sodium chloride 0.9 % bolus 1,000 mL (1,000 mLs Intravenous New Bag/Given 10/17/19 1208)  promethazine (PHENERGAN) tablet 25 mg (25 mg Oral Given 10/17/19 1135)  albuterol (VENTOLIN HFA) 108 (90 Base) MCG/ACT inhaler 2 puff (2 puffs Inhalation Given 10/17/19 1135)  aerochamber Z-Stat Plus/medium 1 each (1 each Other Given 10/17/19 1135)    ED Course  I have reviewed the triage vital signs and the nursing notes.  Pertinent labs & imaging results that were available during my care of the patient were reviewed by me and considered in my medical decision making (see chart for details).    MDM Rules/Calculators/A&P                          49 year old female with known Covid presenting for evaluation of cough, shortness of breath, nausea vomiting diarrhea.  Abdomen is soft and nontender on exam therefore I have low suspicion  that she has concurrent diverticulitis.  Feel this is more consistent with Covid infection.  Reviewed/interpreted labs CBC is without leukocytosis or anemia CMP does not show any evidence of significant electrolyte derangement or AKI.  Liver enzymes are normal.  EKG Sinus tachycardia Probable left atrial enlargement RSR' in V1 or V2, probably normal variant When compared with ECG of 03/28/2018, No significant change was found   CXR reviewed/interpreted - Possible subtle peripheral infiltrate in the left mid to upper lung.  Reassessed pt and she is satting at 90-91% on RA. She is unwell appearing and  Was very tachypneic when she ambulated in the room. She also continued to vomit after receiving antiemetics. Will call hospitalist for admission.   1:16 PM CONSULT With Dr. Wyline Copas who accepts patient for admission.   Final Clinical Impression(s) / ED Diagnoses Final diagnoses:  SOB (shortness of breath)    Rx / DC Orders ED Discharge Orders    None       Rodney Booze, PA-C 10/17/19 1337    Pattricia Boss, MD 10/18/19 1230

## 2019-10-17 NOTE — H&P (Signed)
History and Physical    [name] VZD:[id number] DOB: Jun 18, 1970 DOA: 10/17/2019  PCP: Libby Maw, MD  Patient coming from: Home  Chief Complaint: SOB, vomiting  HPI: [name] is a 49 y.o. female with medical history significant of obesity, hypertension, hyperlipidemia who was recently diagnosed with COVID-19 pneumonia 1 week prior to this visit.  Positive sick contact includes her husband who was diagnosed with COVID-19.  Patient was seen recently in the emergency department and was subsequently discharged home as she appeared stable at the time.  Since being discharged, patient has noted increasing shortness of breath, nausea with vomiting, unable to tolerate p.o. intake.  Patient subsequent presented to the emergency department.  ED Course: Department, patient was noted to have unremarkable CT abdomen findings.  O2 sats down to 91% on RA. Patient was noted to be dehydrated on exam and was started on IV fluid hydration.  Of note, creatinine noted to be 0.88 with a potassium of 3.6.  Hospitalist service consulted for consideration for medical admission  Review of Systems:  Review of Systems  Constitutional: Positive for chills, fever and malaise/fatigue.  HENT: Negative for ear discharge, ear pain and nosebleeds.   Eyes: Negative for double vision and photophobia.  Respiratory: Positive for cough and shortness of breath.   Cardiovascular: Negative for palpitations and orthopnea.  Gastrointestinal: Positive for abdominal pain, diarrhea, nausea and vomiting.  Musculoskeletal: Positive for myalgias. Negative for back pain and neck pain.  Neurological: Negative for speech change, focal weakness and seizures.  Psychiatric/Behavioral: Negative for hallucinations and memory loss. The patient is not nervous/anxious.     Past Medical History:  Diagnosis Date  . Abnormal Pap smear 2000   [facility] Biopsy  . Allergy   . Arthritis    hands  . Back pain   . Blood transfusion  without reported diagnosis 2005  . Breast fibroadenoma   . Diverticulitis of colon with perforation 05/20/2014  . GERD (gastroesophageal reflux disease)   . H/O metrorrhagia 12/2006  . H/O myomectomy 07-2006   robotic  . H/O sinusitis   . Headache(784.0)   . Heart murmur   . History of conization of cervix 2000  . History of ectopic pregnancy 2005   r salpyngectomy  . History of hysterectomy, supracervical 08-2008   robotic  . History of syphilis   . History of syphilis   . Hx of adenomatous polyp of colon 12/31/2017  . Hx of herpes simplex type 2 infection   . Hyperlipidemia   . Hypertension   . Ileostomy present ([place]) 10/26/2014  . Intra-abdominal abscess ([place]) 07/21/2014  . PMS (premenstrual syndrome)     Past Surgical History:  Procedure Laterality Date  . ABDOMINAL HYSTERECTOMY    . BREAST LUMPECTOMY  10/11   rt-neg  . BREAST SURGERY  2004   fibroademoma  . CERVICAL CONE BIOPSY  2000  . COLON RESECTION N/A 07/06/2014   Procedure: LAPAROSCOPIC LOOP ILEOSTOMY WITH PLACEMENT OF PELVIC DRAIN;  Surgeon: Donnie Mesa, MD;  Location: [place];  Service: General;  Laterality: N/A;  . COLON SURGERY     diverticulitis   . COLONOSCOPY    . DILATION AND CURETTAGE OF UTERUS  2009  . DIVERTING ILEOSTOMY Right 06/2014  . ECTOPIC PREGNANCY SURGERY  2005   R salpyngectomy  . ILEOSTOMY N/A 10/26/2014   Procedure: LOOP ILEOSTOMY REVERSAL;  Surgeon: Donnie Mesa, MD;  Location: [place];  Service: General;  Laterality: N/A;  . LAPAROSCOPIC SIGMOID COLECTOMY N/A 06/30/2014  Procedure: LAPAROSCOPIC ASSISTED SIGMOID COLECTOMY;  Surgeon: Donnie Mesa, MD;  Location: [place];  Service: General;  Laterality: N/A;  . MYOMECTOMY  2008   robotic  . POLYPECTOMY  2009  . ROBOTIC ASSISTED LAP VAGINAL HYSTERECTOMY  2010   supracervical  . SALPINGECTOMY  2005  . TRIGGER FINGER RELEASE Right 09/28/2012   Procedure: RELEASE TRIGGER FINGER/A-1 PULLEY RIGHT THUMB;  Surgeon: Jolyn Nap, MD;  Location: [place];  Service: Orthopedics;  Laterality: Right;  . ULNAR NERVE TRANSPOSITION Right 12/01/2018   Procedure: ULNAR NERVE DECOMPRESSION;  Surgeon: Daryll Brod, MD;  Location: [place];  Service: Orthopedics;  Laterality: Right;  AXILLARY     reports that she quit smoking about 8 years ago. Her smoking use included cigarettes. She has a 20.00 pack-year smoking history. She has never used smokeless tobacco. She reports previous alcohol use. She reports that she does not use drugs.  Allergies  Allergen Reactions  . Keflex [Cephalexin] Anaphylaxis  . Penicillins Anaphylaxis    Has patient had a PCN reaction causing immediate rash, facial/tongue/throat swelling, SOB or lightheadedness with hypotension: Yes Has patient had a PCN reaction causing severe rash involving mucus membranes or skin necrosis: No Has patient had a PCN reaction that required hospitalization: No Has patient had a PCN reaction occurring within the last 10 years: No If all of the above answers are "NO", then may proceed with Cephalosporin use.     Family History  Problem Relation Age of Onset  . Hypertension Paternal Grandmother   . Diabetes Paternal Grandmother   . Hypertension Maternal Grandmother   . Sarcoidosis Mother   . Hypertension Mother   . Hyperlipidemia Mother   . Thyroid disease Maternal Aunt   . Mental illness Cousin   . Fibroids Sister   . Colon cancer Neg Hx   . Colon polyps Neg Hx   . Esophageal cancer Neg Hx   . Rectal cancer Neg Hx   . Stomach cancer Neg Hx     Prior to Admission medications   Medication Sig Start Date End Date Taking? Authorizing Provider  amLODipine (NORVASC) 5 MG tablet Take 1 tablet (5 mg total) by mouth daily. 09/07/19   Libby Maw, MD  atorvastatin (LIPITOR) 20 MG tablet Take 1 tablet (20 mg total) by mouth daily. 09/07/19   Libby Maw, MD  Black Cohosh (REMIFEMIN) 20 MG TABS Take 40 mg by mouth daily.    [provider]  ibuprofen (ADVIL) 600 MG tablet Take 1 tablet (600 mg total) by mouth every 8 (eight) hours as needed. 10/11/19   Libby Maw, MD  lisinopril (ZESTRIL) 10 MG tablet Take 1 tablet (10 mg total) by mouth daily. 09/07/19   Libby Maw, MD  Multiple Vitamins-Minerals (HAIR/SKIN/NAILS/BIOTIN PO) Take 1 tablet by mouth daily.    [provider]  nitroGLYCERIN (NITROSTAT) 0.4 MG SL tablet Place 1 tablet (0.4 mg total) under the tongue every 5 (five) minutes as needed for up to 25 days for chest pain. 09/06/19 10/01/19  Adrian Prows, MD  promethazine (PHENERGAN) 25 MG tablet Take 1 tablet (25 mg total) by mouth every 8 (eight) hours as needed for nausea or vomiting. 10/14/19   Libby Maw, MD    Physical Exam: Vitals:   10/17/19 0057 10/17/19 0441 10/17/19 1040 10/17/19 1130  BP:  133/77 137/74 (!) 146/126  Pulse:  (!) 101 (!) 109 (!) 120  Resp:  17 18 16  Temp:  99.3 F (37.4 C) 100.1 F (37.8 C) 98.2 F (36.8 C)  TempSrc:   Oral Oral  SpO2:  93% 94% 97%  Weight: 100.2 kg     Height: 5\' 4"  (1.626 m)       Constitutional: NAD, calm, comfortable Vitals:   10/17/19 0057 10/17/19 0441 10/17/19 1040 10/17/19 1130  BP:  133/77 137/74 (!) 146/126  Pulse:  (!) 101 (!) 109 (!) 120  Resp:  17 18 16   Temp:  99.3 F (37.4 C) 100.1 F (37.8 C) 98.2 F (36.8 C)  TempSrc:   Oral Oral  SpO2:  93% 94% 97%  Weight: 100.2 kg     Height: 5\' 4"  (1.626 m)      Eyes: PERRL, lids and conjunctivae normal ENMT: Ears appear normal external ears normal, head atraumatic Neck: normal, supple, no masses, no thyromegaly Respiratory: clear to auscultation bilaterally, no wheezing, no crackles. Normal respiratory effort. No accessory muscle use.  Cardiovascular: Perfused, no notable JVD Abdomen: Obese, nondistended Musculoskeletal: no clubbing / cyanosis. No joint deformity upper and lower extremities. Good ROM, no contractures. Normal muscle tone.  Skin: no  rashes, lesions Neurologic: CN 2-12 grossly intact. Sensation intact, no tremors. Strength 5/5 in all 4.  Psychiatric: Normal judgment and insight. Alert and oriented x 3. Normal mood.    Labs on Admission: I have personally reviewed following labs and imaging studies  CBC: Recent Labs  Lab 10/17/19 0100  WBC 6.6  NEUTROABS 4.5  HGB 13.5  HCT 42.0  MCV 90.9  PLT 161   Basic Metabolic Panel: Recent Labs  Lab 10/17/19 0100  NA 139  K 3.6  CL 100  CO2 25  GLUCOSE 131*  BUN 8  CREATININE 0.88  CALCIUM 8.9   GFR: Estimated Creatinine Clearance: 88 mL/min (by C-G formula based on SCr of 0.88 mg/dL). Liver Function Tests: Recent Labs  Lab 10/17/19 0100  AST 33  ALT 22  ALKPHOS 50  BILITOT 0.8  PROT 7.4  ALBUMIN 4.2   No results for input(s): LIPASE, AMYLASE in the last 168 hours. No results for input(s): AMMONIA in the last 168 hours. Coagulation Profile: No results for input(s): INR, PROTIME in the last 168 hours. Cardiac Enzymes: No results for input(s): CKTOTAL, CKMB, CKMBINDEX, TROPONINI in the last 168 hours. BNP (last 3 results) No results for input(s): PROBNP in the last 8760 hours. HbA1C: No results for input(s): HGBA1C in the last 72 hours. CBG: No results for input(s): GLUCAP in the last 168 hours. Lipid Profile: No results for input(s): CHOL, HDL, LDLCALC, TRIG, CHOLHDL, LDLDIRECT in the last 72 hours. Thyroid Function Tests: No results for input(s): TSH, T4TOTAL, FREET4, T3FREE, THYROIDAB in the last 72 hours. Anemia Panel: No results for input(s): VITAMINB12, FOLATE, FERRITIN, TIBC, IRON, RETICCTPCT in the last 72 hours. Urine analysis:    Component Value Date/Time   COLORURINE YELLOW 10/10/2019 0905   APPEARANCEUR CLEAR 10/10/2019 0905   LABSPEC >1.030 (H) 10/10/2019 0905   PHURINE 5.5 10/10/2019 0905   GLUCOSEU NEGATIVE 10/10/2019 0905   GLUCOSEU NEGATIVE 10/04/2019 0830   HGBUR NEGATIVE 10/10/2019 0905   BILIRUBINUR NEGATIVE 10/10/2019  0905   BILIRUBINUR negative 05/29/2017 1742   KETONESUR NEGATIVE 10/10/2019 0905   PROTEINUR NEGATIVE 10/10/2019 0905   UROBILINOGEN 0.2 10/04/2019 0830   NITRITE NEGATIVE 10/10/2019 0905   LEUKOCYTESUR NEGATIVE 10/10/2019 0905   Sepsis Labs: !!!!!!!!!!!!!!!!!!!!!!!!!!!!!!!!!!!!!!!!!!!! @LABRCNTIP (procalcitonin:4,lacticidven:4) ) Recent Results (from the past 240 hour(s))  SARS Coronavirus 2 by RT PCR (hospital  order, performed in [facility] hospital lab) Nasopharyngeal Nasopharyngeal Swab     Status: Abnormal   Collection Time: 10/10/19  9:06 AM   Specimen: Nasopharyngeal Swab  Result Value Ref Range Status   SARS Coronavirus 2 POSITIVE (A) NEGATIVE Final    Comment: RESULT CALLED TO, READ BACK BY AND VERIFIED WITH: [address] XV [id number] PHILLIPS C (NOTE) SARS-CoV-2 target nucleic acids are DETECTED  SARS-CoV-2 RNA is generally detectable in upper respiratory specimens  during the acute phase of infection.  Positive results are indicative  of the presence of the identified virus, but do not rule out bacterial infection or co-infection with other pathogens not detected by the test.  Clinical correlation with patient history and  other diagnostic information is necessary to determine patient infection status.  The expected result is negative.  Fact Sheet for Patients:   StrictlyIdeas.no   Fact Sheet for Healthcare Providers:   BankingDealers.co.za    This test is not yet approved or cleared by the [place] FDA and  has been authorized for detection and/or diagnosis of SARS-CoV-2 by FDA under an Emergency Use Authorization (EUA).  This EUA will remain in effect (meaning this  test can be used) for the duration of  the COVID-19 declaration under Section 564(b)(1) of the Act, 21 U.S.C. section 360-bbb-3(b)(1), unless the authorization is terminated or revoked sooner.  Performed at [facility], [address] [place], [place] [address]      Radiological Exams on Admission: DG Chest [facility] 1 View  Result Date: 10/17/2019 CLINICAL DATA:  COVID positive EXAM: PORTABLE CHEST 1 VIEW COMPARISON:  04/21/2018 FINDINGS: Possible subtle peripheral infiltrate in the left mid and upper lung. No consolidation or effusion. Normal heart size. No pneumothorax. IMPRESSION: Possible subtle peripheral infiltrate in the left mid to upper lung. Electronically Signed   By: Donavan Foil M.D.   On: 10/17/2019 01:54    EKG: Independently reviewed. Sinus tach  Assessment/Plan Principal Problem:   COVID-19 Active Problems:   Essential hypertension   Dyslipidemia   Obesity (BMI 30-39.9)   Elevated LDL cholesterol level   1. COVID-19 pneumonia 1. Confirmed Covid positive 2. Chest x-ray findings noted 3. Presenting O2 saturation as low as 91% on room air 4. Inflammatory markers are currently pending in the emergency department 5. We will start patient on remdesivir and IV steroids 6. Continue patient with vitamin C and zinc 7. Follow serial inflammatory markers 8. Continue with supplemental oxygen as needed 2. Hypertension 1. Blood pressure presently stable, albeit suboptimally controlled 2. Continue home regimen as tolerated 3. Dyslipidemia 1. Seems to be stable at this time 2. Continue home regimen as tolerated 4. Obesity 1. Recommend diet and lifestyle modification 5. Nausea and vomiting 1. Suspect secondary to presenting Covid infection 2. Recent CT abdomen pelvis reviewed, found to be unremarkable. 3. Continue antiemetics and supportive care 4. Continue IV fluid as tolerated  DVT prophylaxis: Lovenox subq  Code Status: Full Family Communication: Pt in room  Disposition Plan:   Consults called:  Admission status: Inpatient as will likely require greater than 2 midnight stay to treat dehydration with IV fluid hydration also to treat COVID-19 pneumonia  Marylu Lund MD [facility] Pager On  Amion  If 7PM-7AM, please contact night-coverage  10/17/2019, 1:47 PM

## 2019-10-17 NOTE — ED Triage Notes (Signed)
Patient is COVID positive. Patient reports SHOB with exertion, vomiting, and diarrhea. patient states she cannot keep anything down.

## 2019-10-18 ENCOUNTER — Other Ambulatory Visit: Payer: Self-pay

## 2019-10-18 ENCOUNTER — Inpatient Hospital Stay (HOSPITAL_COMMUNITY)
Admission: EM | Admit: 2019-10-18 | Discharge: 2019-10-21 | DRG: 177 | Disposition: A | Discharge status: Home / Self Care | Payer: 59 | Attending: Student | Admitting: Student

## 2019-10-18 ENCOUNTER — Encounter (HOSPITAL_COMMUNITY): Payer: Self-pay

## 2019-10-18 ENCOUNTER — Encounter: Payer: Self-pay | Admitting: Family Medicine

## 2019-10-18 DIAGNOSIS — Z833 Family history of diabetes mellitus: Secondary | ICD-10-CM

## 2019-10-18 DIAGNOSIS — U071 COVID-19: Principal | ICD-10-CM | POA: Diagnosis present

## 2019-10-18 DIAGNOSIS — Z8249 Family history of ischemic heart disease and other diseases of the circulatory system: Secondary | ICD-10-CM

## 2019-10-18 DIAGNOSIS — J1282 Pneumonia due to coronavirus disease 2019: Secondary | ICD-10-CM | POA: Diagnosis present

## 2019-10-18 DIAGNOSIS — Z87891 Personal history of nicotine dependence: Secondary | ICD-10-CM

## 2019-10-18 DIAGNOSIS — K219 Gastro-esophageal reflux disease without esophagitis: Secondary | ICD-10-CM | POA: Diagnosis present

## 2019-10-18 DIAGNOSIS — E782 Mixed hyperlipidemia: Secondary | ICD-10-CM | POA: Diagnosis present

## 2019-10-18 DIAGNOSIS — Z6837 Body mass index (BMI) 37.0-37.9, adult: Secondary | ICD-10-CM

## 2019-10-18 DIAGNOSIS — J9601 Acute respiratory failure with hypoxia: Secondary | ICD-10-CM | POA: Diagnosis not present

## 2019-10-18 DIAGNOSIS — Z83438 Family history of other disorder of lipoprotein metabolism and other lipidemia: Secondary | ICD-10-CM

## 2019-10-18 DIAGNOSIS — I10 Essential (primary) hypertension: Secondary | ICD-10-CM | POA: Diagnosis present

## 2019-10-18 DIAGNOSIS — Z881 Allergy status to other antibiotic agents status: Secondary | ICD-10-CM

## 2019-10-18 DIAGNOSIS — Z880 Allergy status to penicillin: Secondary | ICD-10-CM

## 2019-10-18 LAB — CBC WITH DIFFERENTIAL/PLATELET
Abs Immature Granulocytes: 0.04 10*3/uL (ref 0.00–0.07)
Abs Immature Granulocytes: 0.07 10*3/uL (ref 0.00–0.07)
Basophils Absolute: 0 10*3/uL (ref 0.0–0.1)
Basophils Absolute: 0 10*3/uL (ref 0.0–0.1)
Basophils Relative: 0 %
Basophils Relative: 0 %
Eosinophils Absolute: 0 10*3/uL (ref 0.0–0.5)
Eosinophils Absolute: 0 10*3/uL (ref 0.0–0.5)
Eosinophils Relative: 0 %
Eosinophils Relative: 0 %
HCT: 38.4 % (ref 36.0–46.0)
HCT: 39.9 % (ref 36.0–46.0)
Hemoglobin: 12.6 g/dL (ref 12.0–15.0)
Hemoglobin: 13 g/dL (ref 12.0–15.0)
Immature Granulocytes: 1 %
Immature Granulocytes: 1 %
Lymphocytes Relative: 30 %
Lymphocytes Relative: 24 %
Lymphs Abs: 1.7 10*3/uL (ref 0.7–4.0)
Lymphs Abs: 1.6 10*3/uL (ref 0.7–4.0)
MCH: 29.7 pg (ref 26.0–34.0)
MCH: 29.3 pg (ref 26.0–34.0)
MCHC: 32.8 g/dL (ref 30.0–36.0)
MCHC: 32.6 g/dL (ref 30.0–36.0)
MCV: 90.6 fL (ref 80.0–100.0)
MCV: 89.9 fL (ref 80.0–100.0)
Monocytes Absolute: 0.4 10*3/uL (ref 0.1–1.0)
Monocytes Absolute: 0.8 10*3/uL (ref 0.1–1.0)
Monocytes Relative: 7 %
Monocytes Relative: 12 %
Neutro Abs: 3.5 10*3/uL (ref 1.7–7.7)
Neutro Abs: 4.3 10*3/uL (ref 1.7–7.7)
Neutrophils Relative %: 62 %
Neutrophils Relative %: 63 %
Platelets: 301 10*3/uL (ref 150–400)
Platelets: 360 10*3/uL (ref 150–400)
RBC: 4.24 MIL/uL (ref 3.87–5.11)
RBC: 4.44 MIL/uL (ref 3.87–5.11)
RDW: 13.1 % (ref 11.5–15.5)
RDW: 13.2 % (ref 11.5–15.5)
WBC: 5.5 10*3/uL (ref 4.0–10.5)
WBC: 6.8 10*3/uL (ref 4.0–10.5)
nRBC: 0 % (ref 0.0–0.2)
nRBC: 0 % (ref 0.0–0.2)

## 2019-10-18 LAB — COMPREHENSIVE METABOLIC PANEL
ALT: 28 U/L (ref 0–44)
ALT: 29 U/L (ref 0–44)
AST: 38 U/L (ref 15–41)
AST: 37 U/L (ref 15–41)
Albumin: 3.7 g/dL (ref 3.5–5.0)
Albumin: 4 g/dL (ref 3.5–5.0)
Alkaline Phosphatase: 42 U/L (ref 38–126)
Alkaline Phosphatase: 45 U/L (ref 38–126)
Anion gap: 10 (ref 5–15)
Anion gap: 12 (ref 5–15)
BUN: 9 mg/dL (ref 6–20)
BUN: 13 mg/dL (ref 6–20)
CO2: 26 mmol/L (ref 22–32)
CO2: 25 mmol/L (ref 22–32)
Calcium: 8.8 mg/dL — ABNORMAL LOW (ref 8.9–10.3)
Calcium: 8.9 mg/dL (ref 8.9–10.3)
Chloride: 102 mmol/L (ref 98–111)
Chloride: 102 mmol/L (ref 98–111)
Creatinine, Ser: 0.69 mg/dL (ref 0.44–1.00)
Creatinine, Ser: 0.73 mg/dL (ref 0.44–1.00)
GFR calc Af Amer: >60 mL/min (ref >60)
GFR calc Af Amer: >60 mL/min (ref >60)
GFR calc non Af Amer: >60 mL/min (ref >60)
GFR calc non Af Amer: >60 mL/min (ref >60)
Glucose, Bld: 150 mg/dL — ABNORMAL HIGH (ref 70–99)
Glucose, Bld: 144 mg/dL — ABNORMAL HIGH (ref 70–99)
Potassium: 3.9 mmol/L (ref 3.5–5.1)
Potassium: 3.8 mmol/L (ref 3.5–5.1)
Sodium: 138 mmol/L (ref 135–145)
Sodium: 139 mmol/L (ref 135–145)
Total Bilirubin: 0.6 mg/dL (ref 0.3–1.2)
Total Bilirubin: 0.8 mg/dL (ref 0.3–1.2)
Total Protein: 7.5 g/dL (ref 6.5–8.1)
Total Protein: 8 g/dL (ref 6.5–8.1)

## 2019-10-18 LAB — C-REACTIVE PROTEIN: CRP: 10.7 mg/dL — ABNORMAL HIGH (ref <1.0)

## 2019-10-18 LAB — FERRITIN: Ferritin: 469 ng/mL — ABNORMAL HIGH (ref 11–307)

## 2019-10-18 LAB — D-DIMER, QUANTITATIVE
D-Dimer, Quant: <0.27 ug/mL-FEU (ref 0.00–0.50)
D-Dimer, Quant: 0.31 ug/mL-FEU (ref 0.00–0.50)

## 2019-10-18 MED ORDER — DEXAMETHASONE SODIUM PHOSPHATE 10 MG/ML IJ SOLN
10.0000 mg | Freq: Once | INTRAMUSCULAR | Status: AC
  Administered 2019-10-18: 10 mg via INTRAVENOUS
  Filled 2019-10-18: qty 1

## 2019-10-18 MED ORDER — ONDANSETRON 4 MG PO TBDP: 4.0000 mg | ORAL_TABLET | Freq: Three times a day (TID) | ORAL | 0 refills | Status: DC | PRN

## 2019-10-18 MED ORDER — PREDNISONE 10 MG PO TABS: ORAL_TABLET | ORAL | 0 refills | Status: DC

## 2019-10-18 MED ORDER — METHYLPREDNISOLONE SODIUM SUCC 125 MG IJ SOLR
60.0000 mg | Freq: Two times a day (BID) | INTRAMUSCULAR | Status: DC
  Administered 2019-10-18: 60 mg via INTRAVENOUS
  Filled 2019-10-18: qty 2

## 2019-10-18 MED ORDER — SODIUM CHLORIDE 0.9 % IV SOLN
100.0000 mg | Freq: Every day | INTRAVENOUS | Status: AC
  Administered 2019-10-19 – 2019-10-21 (×3): 100 mg via INTRAVENOUS
  Filled 2019-10-18 (×3): qty 20

## 2019-10-18 NOTE — ED Notes (Signed)
Awaiting arrival of patient's ride.

## 2019-10-18 NOTE — Discharge Instructions (Signed)
Patient scheduled for outpatient Remdesivir infusions at 1pm on Tuesday 8/31, Wednesday 9/1, and Thursday 9/2 at [facility]. Please inform the patient to park at [place], as staff will be escorting the patient through the [place] entrance of the hospital.    There is a wave flag banner located near the entrance on [address]. Turn into this entrance and immediately turn left and park in 1 of the 5 designated Covid Infusion Parking spots. There is a phone number on the sign, please call and let the staff know what spot you are in and we will come out and get you. For questions call [phone].  Thanks.

## 2019-10-18 NOTE — ED Notes (Signed)
Patient oxygen saturation 98% on room air at rest while prone. Patient ambulatory in room without difficulty. Oxygen saturation remained 93%-98% on room air while ambulating. NAD noted.

## 2019-10-18 NOTE — ED Triage Notes (Signed)
Patient arrived stating that she was seen here earlier today and requesting being sent home with oxygen, but sent with an appointment for the infusion center. Reports her pulse ox at home was reading low while walking around and did not want to worry about it throughout the night. Patient on arrival from walking in saturation at 87% RA.

## 2019-10-18 NOTE — Discharge Summary (Signed)
Physician Discharge Summary  [name] WFU:[id number] DOB: 20-Feb-1970 DOA: 10/17/2019  PCP: Libby Maw, MD  Admit date: 10/17/2019 Discharge date: 10/18/2019  Admitted From: Home Disposition: Home  Recommendations for Outpatient Follow-up:  1. Follow up with PCP in 1-2 weeks 2. Follow-up with Remdesivir infusion center for completion of therapy on Tuesday Wednesday and Thursday as directed:  Home Health: None Equipment/Devices: None  Discharge Condition: Stable CODE STATUS: Full Diet recommendation: Low-fat low-salt diet as tolerated  Brief/Interim Summary: [name] is a 49 y.o. female with medical history significant of obesity, hypertension, hyperlipidemia who was recently diagnosed with COVID-19 pneumonia 1 week prior to this visit.  Positive sick contact includes her husband who was diagnosed with COVID-19.  Patient was seen recently in the emergency department and was subsequently discharged home as she appeared stable at the time.  Since being discharged, patient has noted increasing shortness of breath, nausea with vomiting, unable to tolerate p.o. intake.  Patient subsequent presented to the emergency department: patient was noted to have unremarkable CT abdomen findings.  O2 sats down to 91% on RA. Patient was noted to be dehydrated on exam and was started on IV fluid hydration.  Of note, creatinine noted to be 0.88 with a potassium of 3.6.  Hospitalist service consulted for consideration for medical admission.  Patient admitted as above with acute transient hypoxia in the setting of COVID-19 pneumonia.  Patient presented initially with profound GI symptoms nausea and diarrhea which appear to have resolved quite quickly with IV fluids, antiemetics and increased p.o. intake.  She was noted to be Covid positive in the ED and ultimately admitted overnight given oxygen needs at intake.  Fortunately with high-dose steroids and Remdesivir patient's respiratory status  drastically improved over the past 24 hours much quicker than expected.  She is now able to ambulate around the room without hypoxia, minimal symptoms and is otherwise stable and agreeable for discharge home.  She will be discharged on remainder of steroid taper as well as set up for the last 3 doses of her Remdesivir infusion at the outpatient infusion center on Tuesday Wednesday and Thursday.  At this time patient is currently stable and agreeable for discharge home, we discussed need for quarantine, would recommend 2-week quarantine for this patient given she was only in the hospital overnight and not truly admitted, husband at home is also Covid positive who will need to quarantine at home as well.  Discharge Diagnoses:  Principal Problem:   COVID-19 Active Problems:   Essential hypertension   Dyslipidemia   Obesity (BMI 30-39.9)   Elevated LDL cholesterol level    Discharge Instructions  Discharge Instructions    Call MD for:  difficulty breathing, headache or visual disturbances   Complete by: As directed    Call MD for:  persistant dizziness or light-headedness   Complete by: As directed    Diet - low sodium heart healthy   Complete by: As directed    Discharge instructions   Complete by: As directed    ?   Person Under Monitoring Name: [name]  Location: [place] [address]   Infection Prevention Recommendations for Individuals Confirmed to have, or Being Evaluated for, 2019 Novel Coronavirus (COVID-19) Infection Who Receive Care at Home  Individuals who are confirmed to have, or are being evaluated for, COVID-19 should follow the prevention steps below until a healthcare provider or local or state health department says they can return to normal activities.  Stay  home except to get medical care You should restrict activities outside your home, except for getting medical care. Do not go to work, school, or public areas, and do not use  public transportation or taxis.  Call ahead before visiting your doctor Before your medical appointment, call the healthcare provider and tell them that you have, or are being evaluated for, COVID-19 infection. This will help the healthcare provider's office take steps to keep other people from getting infected. Ask your healthcare provider to call the local or state health department.  Monitor your symptoms Seek prompt medical attention if your illness is worsening (e.g., difficulty breathing). Before going to your medical appointment, call the healthcare provider and tell them that you have, or are being evaluated for, COVID-19 infection. Ask your healthcare provider to call the local or state health department.  Wear a facemask You should wear a facemask that covers your nose and mouth when you are in the same room with other people and when you visit a healthcare provider. People who live with or visit you should also wear a facemask while they are in the same room with you.  Separate yourself from other people in your home As much as possible, you should stay in a different room from other people in your home. Also, you should use a separate bathroom, if available.  Avoid sharing household items You should not share dishes, drinking glasses, cups, eating utensils, towels, bedding, or other items with other people in your home. After using these items, you should wash them thoroughly with soap and water.  Cover your coughs and sneezes Cover your mouth and nose with a tissue when you cough or sneeze, or you can cough or sneeze into your sleeve. Throw used tissues in a lined trash can, and immediately wash your hands with soap and water for at least 20 seconds or use an alcohol-based hand rub.  Wash your [facility] your hands often and thoroughly with soap and water for at least 20 seconds. You can use an alcohol-based hand sanitizer if soap and water are not available and if your  hands are not visibly dirty. Avoid touching your eyes, nose, and mouth with unwashed hands.   Prevention Steps for Caregivers and Household Members of Individuals Confirmed to have, or Being Evaluated for, COVID-19 Infection Being Cared for in the Home  If you live with, or provide care at home for, a person confirmed to have, or being evaluated for, COVID-19 infection please follow these guidelines to prevent infection:  Follow healthcare provider's instructions Make sure that you understand and can help the patient follow any healthcare provider instructions for all care.  Provide for the patient's basic needs You should help the patient with basic needs in the home and provide support for getting groceries, prescriptions, and other personal needs.  Monitor the patient's symptoms If they are getting sicker, call his or her medical provider and tell them that the patient has, or is being evaluated for, COVID-19 infection. This will help the healthcare provider's office take steps to keep other people from getting infected. Ask the healthcare provider to call the local or state health department.  Limit the number of people who have contact with the patient If possible, have only one caregiver for the patient. Other household members should stay in another home or place of residence. If this is not possible, they should stay in another room, or be separated from the patient as much as possible. Use a separate  bathroom, if available. Restrict visitors who do not have an essential need to be in the home.  Keep older adults, very young children, and other sick people away from the patient Keep older adults, very young children, and those who have compromised immune systems or chronic health conditions away from the patient. This includes people with chronic heart, lung, or kidney conditions, diabetes, and cancer.  Ensure good ventilation Make sure that shared spaces in the home have good  air flow, such as from an air conditioner or an opened window, weather permitting.  Wash your hands often Wash your hands often and thoroughly with soap and water for at least 20 seconds. You can use an alcohol based hand sanitizer if soap and water are not available and if your hands are not visibly dirty. Avoid touching your eyes, nose, and mouth with unwashed hands. Use disposable paper towels to dry your hands. If not available, use dedicated cloth towels and replace them when they become wet.  Wear a facemask and gloves Wear a disposable facemask at all times in the room and gloves when you touch or have contact with the patient's blood, body fluids, and/or secretions or excretions, such as sweat, saliva, sputum, nasal mucus, vomit, urine, or feces.  Ensure the mask fits over your nose and mouth tightly, and do not touch it during use. Throw out disposable facemasks and gloves after using them. Do not reuse. Wash your hands immediately after removing your facemask and gloves. If your personal clothing becomes contaminated, carefully remove clothing and launder. Wash your hands after handling contaminated clothing. Place all used disposable facemasks, gloves, and other waste in a lined container before disposing them with other household waste. Remove gloves and wash your hands immediately after handling these items.  Do not share dishes, glasses, or other household items with the patient Avoid sharing household items. You should not share dishes, drinking glasses, cups, eating utensils, towels, bedding, or other items with a patient who is confirmed to have, or being evaluated for, COVID-19 infection. After the person uses these items, you should wash them thoroughly with soap and water.  Wash laundry thoroughly Immediately remove and wash clothes or bedding that have blood, body fluids, and/or secretions or excretions, such as sweat, saliva, sputum, nasal mucus, vomit, urine, or feces, on  them. Wear gloves when handling laundry from the patient. Read and follow directions on labels of laundry or clothing items and detergent. In general, wash and dry with the warmest temperatures recommended on the label.  Clean all areas the individual has used often Clean all touchable surfaces, such as counters, tabletops, doorknobs, bathroom fixtures, toilets, phones, keyboards, tablets, and bedside tables, every day. Also, clean any surfaces that may have blood, body fluids, and/or secretions or excretions on them. Wear gloves when cleaning surfaces the patient has come in contact with. Use a diluted bleach solution (e.g., dilute bleach with 1 part bleach and 10 parts water) or a household disinfectant with a label that says EPA-registered for coronaviruses. To make a bleach solution at home, add 1 tablespoon of bleach to 1 quart (4 cups) of water. For a larger supply, add  cup of bleach to 1 gallon (16 cups) of water. Read labels of cleaning products and follow recommendations provided on product labels. Labels contain instructions for safe and effective use of the cleaning product including precautions you should take when applying the product, such as wearing gloves or eye protection and making sure you have good ventilation  during use of the product. Remove gloves and wash hands immediately after cleaning.  Monitor yourself for signs and symptoms of illness Caregivers and household members are considered close contacts, should monitor their health, and will be asked to limit movement outside of the home to the extent possible. Follow the monitoring steps for close contacts listed on the symptom monitoring form.   ? If you have additional questions, contact your local health department or call the epidemiologist on call at [phone] (available 24/7). ? This guidance is subject to change. For the most up-to-date guidance from [facility], please refer to their  website: YouBlogs.pl   Increase activity slowly   Complete by: As directed      Allergies as of 10/18/2019      Reactions   Keflex [cephalexin] Anaphylaxis   Penicillins Anaphylaxis   Has patient had a PCN reaction causing immediate rash, facial/tongue/throat swelling, SOB or lightheadedness with hypotension: Yes Has patient had a PCN reaction causing severe rash involving mucus membranes or skin necrosis: No Has patient had a PCN reaction that required hospitalization: No Has patient had a PCN reaction occurring within the last 10 years: No If all of the above answers are "NO", then may proceed with Cephalosporin use.      Medication List    STOP taking these medications   ibuprofen 600 MG tablet Commonly known as: ADVIL     TAKE these medications   acetaminophen 500 MG tablet Commonly known as: TYLENOL Take 500 mg by mouth every 6 (six) hours as needed for fever.   amLODipine 5 MG tablet Commonly known as: NORVASC Take 1 tablet (5 mg total) by mouth daily.   atorvastatin 20 MG tablet Commonly known as: LIPITOR Take 1 tablet (20 mg total) by mouth daily.   HAIR/SKIN/NAILS/BIOTIN PO Take 1 tablet by mouth daily.   lisinopril 10 MG tablet Commonly known as: ZESTRIL Take 1 tablet (10 mg total) by mouth daily.   nitroGLYCERIN 0.4 MG SL tablet Commonly known as: Nitrostat Place 1 tablet (0.4 mg total) under the tongue every 5 (five) minutes as needed for up to 25 days for chest pain.   ondansetron 4 MG disintegrating tablet Commonly known as: Zofran ODT Take 1 tablet (4 mg total) by mouth every 8 (eight) hours as needed for nausea or vomiting.   predniSONE 10 MG tablet Commonly known as: DELTASONE Take 4 tablets (40 mg total) by mouth daily for 4 days, THEN 3 tablets (30 mg total) daily for 4 days, THEN 2 tablets (20 mg total) daily for 4 days, THEN 1 tablet (10 mg total) daily for 4 days. Start taking  on: October 18, 2019   promethazine 25 MG tablet Commonly known as: PHENERGAN Take 1 tablet (25 mg total) by mouth every 8 (eight) hours as needed for nausea or vomiting.   Remifemin 20 MG Tabs Generic drug: Black Cohosh Take 40 mg by mouth daily.       Allergies  Allergen Reactions  . Keflex [Cephalexin] Anaphylaxis  . Penicillins Anaphylaxis    Has patient had a PCN reaction causing immediate rash, facial/tongue/throat swelling, SOB or lightheadedness with hypotension: Yes Has patient had a PCN reaction causing severe rash involving mucus membranes or skin necrosis: No Has patient had a PCN reaction that required hospitalization: No Has patient had a PCN reaction occurring within the last 10 years: No If all of the above answers are "NO", then may proceed with Cephalosporin use.     Consultations:  None  Procedures/Studies: CT ABDOMEN PELVIS W CONTRAST  Result Date: 10/10/2019 CLINICAL DATA:  Left lower quadrant pain. EXAM: CT ABDOMEN AND PELVIS WITH CONTRAST TECHNIQUE: Multidetector CT imaging of the abdomen and pelvis was performed using the standard protocol following bolus administration of intravenous contrast. CONTRAST:  153mL OMNIPAQUE IOHEXOL 300 MG/ML  SOLN COMPARISON:  September 01, 2018 FINDINGS: Lower chest: Very mild posterior bibasilar atelectasis is seen. Hepatobiliary: No focal liver abnormality is seen. No gallstones, gallbladder wall thickening, or biliary dilatation. Pancreas: Unremarkable. No pancreatic ductal dilatation or surrounding inflammatory changes. Spleen: Normal in size without focal abnormality. Adrenals/Urinary Tract: Adrenal glands are unremarkable. Kidneys are normal, without renal calculi, focal lesion, or hydronephrosis. Bladder is unremarkable. Stomach/Bowel: Stomach is within normal limits. Appendix appears normal. Surgically anastomosed bowel is seen within the posterior aspect of the right lower quadrant and within the region of the mid sigmoid  colon. No evidence of bowel dilatation. Noninflamed diverticula are seen within the sigmoid colon. Vascular/Lymphatic: There is mild calcification of the abdominal aorta and bilateral common iliac arteries, without evidence of aneurysmal dilatation. No enlarged abdominal or pelvic lymph nodes. Reproductive: Status post hysterectomy. No adnexal masses. Other: No abdominal wall hernia or abnormality. No abdominopelvic ascites. Musculoskeletal: No acute or significant osseous findings. IMPRESSION: 1. Sigmoid diverticulosis without evidence of acute diverticulitis. 2. Aortic atherosclerosis. Aortic Atherosclerosis (ICD10-I70.0). Electronically Signed   By: Virgina Norfolk M.D.   On: 10/10/2019 16:06   DG Chest Port 1 View  Result Date: 10/17/2019 CLINICAL DATA:  COVID positive EXAM: PORTABLE CHEST 1 VIEW COMPARISON:  04/21/2018 FINDINGS: Possible subtle peripheral infiltrate in the left mid and upper lung. No consolidation or effusion. Normal heart size. No pneumothorax. IMPRESSION: Possible subtle peripheral infiltrate in the left mid to upper lung. Electronically Signed   By: Donavan Foil M.D.   On: 10/17/2019 01:54      Subjective: No acute issues or events overnight, patient indicates she feels remarkably improved after proning and steroids overnight.  Indicates her respiratory status at rest feels almost back to baseline, she does remark on some ongoing mild dyspnea with ambulation but did not require oxygen supplementation.  She otherwise denies any further episodes of nausea vomiting diarrhea and declines any constipation headache fevers or chills since admission.   Discharge Exam: Vitals:   10/18/19 1023 10/18/19 1237  BP: (!) 182/131 (!) 166/85  Pulse: (!) 110 (!) 103  Resp: 18 (!) 22  Temp: 98.6 F (37 C)   SpO2: 98% 95%   Vitals:   10/18/19 0741 10/18/19 0753 10/18/19 1023 10/18/19 1237  BP: (!) 180/119 (!) 180/110 (!) 182/131 (!) 166/85  Pulse: (!) 108 (!) 109 (!) 110 (!) 103   Resp: 18 20 18  (!) 22  Temp: 99.5 F (37.5 C) 100 F (37.8 C) 98.6 F (37 C)   TempSrc: Oral Oral Oral   SpO2: 90% 92% 98% 95%  Weight:      Height:        General: Pt is alert, awake, not in acute distress Cardiovascular: RRR, S1/S2 +, no rubs, no gallops Respiratory: CTA bilaterally, no wheezing, no rhonchi Abdominal: Soft, NT, ND, bowel sounds + Extremities: no edema, no cyanosis   The results of significant diagnostics from this hospitalization (including imaging, microbiology, ancillary and laboratory) are listed below for reference.     Microbiology: Recent Results (from the past 240 hour(s))  SARS Coronavirus 2 by RT PCR (hospital order, performed in [facility] hospital lab) Nasopharyngeal Nasopharyngeal Swab  Status: Abnormal   Collection Time: 10/10/19  9:06 AM   Specimen: Nasopharyngeal Swab  Result Value Ref Range Status   SARS Coronavirus 2 POSITIVE (A) NEGATIVE Final    Comment: RESULT CALLED TO, READ BACK BY AND VERIFIED WITH: [place] YN 8295 [id number] PHILLIPS C (NOTE) SARS-CoV-2 target nucleic acids are DETECTED  SARS-CoV-2 RNA is generally detectable in upper respiratory specimens  during the acute phase of infection.  Positive results are indicative  of the presence of the identified virus, but do not rule out bacterial infection or co-infection with other pathogens not detected by the test.  Clinical correlation with patient history and  other diagnostic information is necessary to determine patient infection status.  The expected result is negative.  Fact Sheet for Patients:   StrictlyIdeas.no   Fact Sheet for Healthcare Providers:   BankingDealers.co.za    This test is not yet approved or cleared by the [place] FDA and  has been authorized for detection and/or diagnosis of SARS-CoV-2 by FDA under an Emergency Use Authorization (EUA).  This EUA will remain in effect (meaning this  test can  be used) for the duration of  the COVID-19 declaration under Section 564(b)(1) of the Act, 21 U.S.C. section 360-bbb-3(b)(1), unless the authorization is terminated or revoked sooner.  Performed at [facility], [address] [place], [place] [address]   Blood Culture (routine x 2)     Status: None (Preliminary result)   Collection Time: 10/17/19  1:59 PM   Specimen: BLOOD LEFT HAND  Result Value Ref Range Status   Specimen Description   Final    BLOOD LEFT HAND Performed at [facility], [place] [address], [place], [place] [address]    Special Requests   Final    BOTTLES DRAWN AEROBIC AND ANAEROBIC Blood Culture results may not be optimal due to an inadequate volume of blood received in culture bottles Performed at [facility] [address], [place], [place] [address]    Culture   Final    NO GROWTH < 24 HOURS Performed at [facility] [address] [place], [place] [address]    Report Status PENDING  Incomplete  Blood Culture (routine x 2)     Status: None (Preliminary result)   Collection Time: 10/17/19  1:59 PM   Specimen: BLOOD  Result Value Ref Range Status   Specimen Description   Final    BLOOD RIGHT ANTECUBITAL Performed at [facility] [address], [place], [place] [address]    Special Requests   Final    BOTTLES DRAWN AEROBIC AND ANAEROBIC Blood Culture results may not be optimal due to an inadequate volume of blood received in culture bottles Performed at [facility] [address], [place], [place] [address]    Culture   Final    NO GROWTH < 24 HOURS Performed at [facility] [address] [place], [place] [address]    Report Status PENDING  Incomplete     Labs: BNP (last 3 results) No results for input(s): BNP in the last 8760 hours. Basic Metabolic Panel: Recent Labs  Lab 10/17/19 0100 10/18/19 0500  NA 139 138  K 3.6 3.9  CL 100 102  CO2 25 26   GLUCOSE 131* 150*  BUN 8 9  CREATININE 0.88 0.69  CALCIUM 8.9 8.8*   Liver Function Tests: Recent Labs  Lab 10/17/19 0100 10/18/19 0500  AST  33 38  ALT 22 28  ALKPHOS 50 42  BILITOT 0.8 0.6  PROT 7.4 7.5  ALBUMIN 4.2 3.7   No results for input(s): LIPASE, AMYLASE in the last 168 hours. No results for input(s): AMMONIA in the last 168 hours. CBC: Recent Labs  Lab 10/17/19 0100 10/18/19 0500  WBC 6.6 5.5  NEUTROABS 4.5 3.5  HGB 13.5 12.6  HCT 42.0 38.4  MCV 90.9 90.6  PLT 258 301   Cardiac Enzymes: No results for input(s): CKTOTAL, CKMB, CKMBINDEX, TROPONINI in the last 168 hours. BNP: Invalid input(s): POCBNP CBG: No results for input(s): GLUCAP in the last 168 hours. D-Dimer Recent Labs    10/17/19 1359 10/18/19 0500  DDIMER 0.37 <0.27   Hgb A1c No results for input(s): HGBA1C in the last 72 hours. Lipid Profile Recent Labs    10/17/19 1359  TRIG 85   Thyroid function studies No results for input(s): TSH, T4TOTAL, T3FREE, THYROIDAB in the last 72 hours.  Invalid input(s): FREET3 Anemia work up Recent Labs    10/17/19 1359 10/18/19 0500  FERRITIN 475* 469*   Urinalysis    Component Value Date/Time   COLORURINE YELLOW 10/10/2019 0905   APPEARANCEUR CLEAR 10/10/2019 0905   LABSPEC >1.030 (H) 10/10/2019 0905   PHURINE 5.5 10/10/2019 0905   GLUCOSEU NEGATIVE 10/10/2019 0905   GLUCOSEU NEGATIVE 10/04/2019 0830   HGBUR NEGATIVE 10/10/2019 0905   BILIRUBINUR NEGATIVE 10/10/2019 0905   BILIRUBINUR negative 05/29/2017 1742   KETONESUR NEGATIVE 10/10/2019 0905   PROTEINUR NEGATIVE 10/10/2019 0905   UROBILINOGEN 0.2 10/04/2019 0830   NITRITE NEGATIVE 10/10/2019 0905   LEUKOCYTESUR NEGATIVE 10/10/2019 0905   Sepsis Labs Invalid input(s): PROCALCITONIN,  WBC,  LACTICIDVEN Microbiology Recent Results (from the past 240 hour(s))  SARS Coronavirus 2 by RT PCR (hospital order, performed in [facility] hospital lab) Nasopharyngeal Nasopharyngeal  Swab     Status: Abnormal   Collection Time: 10/10/19  9:06 AM   Specimen: Nasopharyngeal Swab  Result Value Ref Range Status   SARS Coronavirus 2 POSITIVE (A) NEGATIVE Final    Comment: RESULT CALLED TO, READ BACK BY AND VERIFIED WITH: [place] [id number] PHILLIPS C (NOTE) SARS-CoV-2 target nucleic acids are DETECTED  SARS-CoV-2 RNA is generally detectable in upper respiratory specimens  during the acute phase of infection.  Positive results are indicative  of the presence of the identified virus, but do not rule out bacterial infection or co-infection with other pathogens not detected by the test.  Clinical correlation with patient history and  other diagnostic information is necessary to determine patient infection status.  The expected result is negative.  Fact Sheet for Patients:   StrictlyIdeas.no   Fact Sheet for Healthcare Providers:   BankingDealers.co.za    This test is not yet approved or cleared by the [place] FDA and  has been authorized for detection and/or diagnosis of SARS-CoV-2 by FDA under an Emergency Use Authorization (EUA).  This EUA will remain in effect (meaning this  test can be used) for the duration of  the COVID-19 declaration under Section 564(b)(1) of the Act, 21 U.S.C. section 360-bbb-3(b)(1), unless the authorization is terminated or revoked sooner.  Performed at [facility], [address] [place], [place] [address]   Blood Culture (routine x 2)     Status: None (Preliminary result)   Collection Time: 10/17/19  1:59 PM   Specimen: BLOOD LEFT HAND  Result Value Ref Range Status   Specimen Description  Final    BLOOD LEFT HAND Performed at [facility], [place] [address] [place], [place] [address]    Special Requests   Final    BOTTLES DRAWN AEROBIC AND ANAEROBIC Blood Culture results may not be optimal due to an inadequate volume of blood received in culture  bottles Performed at [facility] [address] [place], [place] [address]    Culture   Final    NO GROWTH < 24 HOURS Performed at [facility] [address], [place], [place] [address]    Report Status PENDING  Incomplete  Blood Culture (routine x 2)     Status: None (Preliminary result)   Collection Time: 10/17/19  1:59 PM   Specimen: BLOOD  Result Value Ref Range Status   Specimen Description   Final    BLOOD RIGHT ANTECUBITAL Performed at [facility] [address], [place], [place] [address]    Special Requests   Final    BOTTLES DRAWN AEROBIC AND ANAEROBIC Blood Culture results may not be optimal due to an inadequate volume of blood received in culture bottles Performed at [facility] [address] [place], [place] [address]    Culture   Final    NO GROWTH < 24 HOURS Performed at [facility] [address], [place], [place] [address]    Report Status PENDING  Incomplete     Time coordinating discharge: Over 30 minutes  SIGNED:  Little Ishikawa, DO [facility] 10/18/2019, 2:32 PM Pager   If 7PM-7AM, please contact night-coverage [email or web address]

## 2019-10-18 NOTE — Progress Notes (Signed)
Patient scheduled for outpatient Remdesivir infusions at 1pm on Tuesday 8/31, Wednesday 9/1, and Thursday 9/2 at [facility]. Please inform the patient to park at [place], as staff will be escorting the patient through the [place] entrance of the hospital.    There is a wave flag banner located near the entrance on [address]. Turn into this entrance and immediately turn left and park in 1 of the 5 designated Covid Infusion Parking spots. There is a phone number on the sign, please call and let the staff know what spot you are in and we will come out and get you. For questions call [phone].  Thanks.

## 2019-10-18 NOTE — ED Provider Notes (Signed)
[facility] DEPT Provider Note   CSN: [id number] Arrival date & time: 10/18/19  2003     History Chief Complaint  Patient presents with  . Shortness of Breath    [name] is a 49 y.o. female.  She has a history of hypertension hyperlipidemia and was diagnosed with Covid about a week prior to this visit.  She was seen in the emergency department once and sent home and seen in the emergency department yesterday and admitted overnight for a low oxygen level.  She boarded in the ED and was evaluated this morning by the medicine team and she had a trending pulse ox that showed her sats to be staying in the normal range.  She was discharged on steroids.  She was to follow-up outpatient to get remdesivir.  Since she has been home she has been more short of breath and dyspneic with exertion.  She said her home pulse ox was reading in the high 80s and when she walked into the emergency department from the car she was in the high 70s.  She denies any other new complaints.  No hemoptysis.  Minimal cough.  The history is provided by the patient.  Shortness of Breath Severity:  Moderate Onset quality:  Gradual Timing:  Intermittent Progression:  Worsening Chronicity:  New Context: activity   Relieved by:  Nothing Worsened by:  Activity Ineffective treatments:  Rest and position changes Associated symptoms: cough, fever and vomiting   Associated symptoms: no abdominal pain, no chest pain, no hemoptysis, no rash and no sore throat   Risk factors: obesity        Past Medical History:  Diagnosis Date  . Abnormal Pap smear 2000   [facility] Biopsy  . Allergy   . Arthritis    hands  . Back pain   . Blood transfusion without reported diagnosis 2005  . Breast fibroadenoma   . Diverticulitis of colon with perforation 05/20/2014  . GERD (gastroesophageal reflux disease)   . H/O metrorrhagia 12/2006  . H/O myomectomy 07-2006   robotic  . H/O sinusitis   .  Headache(784.0)   . Heart murmur   . History of conization of cervix 2000  . History of ectopic pregnancy 2005   r salpyngectomy  . History of hysterectomy, supracervical 08-2008   robotic  . History of syphilis   . History of syphilis   . Hx of adenomatous polyp of colon 12/31/2017  . Hx of herpes simplex type 2 infection   . Hyperlipidemia   . Hypertension   . Ileostomy present ([place]) 10/26/2014  . Intra-abdominal abscess ([place]) 07/21/2014  . PMS (premenstrual syndrome)     Patient Active Problem List   Diagnosis Date Noted  . COVID-19 10/12/2019  . Left leg pain 10/04/2019  . Elevated glucose 10/08/2018  . Elevated LDL cholesterol level 10/08/2018  . Healthcare maintenance 10/08/2018  . Need for immunization against influenza 10/08/2018  . Obesity (BMI 30-39.9) 03/29/2018  . Acute chest pain 03/27/2018  . Nonspecific abnormal electrocardiogram (ECG) (EKG) 03/27/2018  . Atypical chest pain 03/27/2018  . Hand pain, right 03/25/2018  . Dyslipidemia 08/15/2017  . History of hysterectomy, supracervical 02/14/2017  . Trigger ring finger of right hand 08/07/2015  . Primary osteoarthritis of first carpometacarpal joint of right hand 08/07/2015  . Diverticulitis of sigmoid colon s/p colectomy 06/30/2014 07/26/2014  . Tachycardia 02/04/2014  . Hx of adenomatous polyp of colon 12/23/2013  . Essential hypertension 11/02/2013  . Former smoker 11/26/2008  Past Surgical History:  Procedure Laterality Date  . ABDOMINAL HYSTERECTOMY    . BREAST LUMPECTOMY  10/11   rt-neg  . BREAST SURGERY  2004   fibroademoma  . CERVICAL CONE BIOPSY  2000  . COLON RESECTION N/A 07/06/2014   Procedure: LAPAROSCOPIC LOOP ILEOSTOMY WITH PLACEMENT OF PELVIC DRAIN;  Surgeon: Donnie Mesa, MD;  Location: [facility];  Service: General;  Laterality: N/A;  . COLON SURGERY     diverticulitis   . COLONOSCOPY    . DILATION AND CURETTAGE OF UTERUS  2009  . DIVERTING ILEOSTOMY Right 06/2014  . ECTOPIC PREGNANCY  SURGERY  2005   R salpyngectomy  . ILEOSTOMY N/A 10/26/2014   Procedure: LOOP ILEOSTOMY REVERSAL;  Surgeon: Donnie Mesa, MD;  Location: [facility];  Service: General;  Laterality: N/A;  . LAPAROSCOPIC SIGMOID COLECTOMY N/A 06/30/2014   Procedure: LAPAROSCOPIC ASSISTED SIGMOID COLECTOMY;  Surgeon: Donnie Mesa, MD;  Location: [facility];  Service: General;  Laterality: N/A;  . MYOMECTOMY  2008   robotic  . POLYPECTOMY  2009  . ROBOTIC ASSISTED LAP VAGINAL HYSTERECTOMY  2010   supracervical  . SALPINGECTOMY  2005  . TRIGGER FINGER RELEASE Right 09/28/2012   Procedure: RELEASE TRIGGER FINGER/A-1 PULLEY RIGHT THUMB;  Surgeon: Jolyn Nap, MD;  Location: [facility];  Service: Orthopedics;  Laterality: Right;  . ULNAR NERVE TRANSPOSITION Right 12/01/2018   Procedure: ULNAR NERVE DECOMPRESSION;  Surgeon: Daryll Brod, MD;  Location: [facility];  Service: Orthopedics;  Laterality: Right;  AXILLARY     OB History    Gravida  4   Para  2   Term  2   Preterm      AB  2   Living  2     SAB  0   TAB  1   Ectopic  1   Multiple  0   Live Births              Family History  Problem Relation Age of Onset  . Hypertension Paternal Grandmother   . Diabetes Paternal Grandmother   . Hypertension Maternal Grandmother   . Sarcoidosis Mother   . Hypertension Mother   . Hyperlipidemia Mother   . Thyroid disease Maternal Aunt   . Mental illness Cousin   . Fibroids Sister   . Colon cancer Neg Hx   . Colon polyps Neg Hx   . Esophageal cancer Neg Hx   . Rectal cancer Neg Hx   . Stomach cancer Neg Hx     Social History   Tobacco Use  . Smoking status: Former Smoker    Packs/day: 1.00    Years: 20.00    Pack years: 20.00    Types: Cigarettes    Quit date: 06/13/2011    Years since quitting: 8.3  . Smokeless tobacco: Never Used  Vaping Use  . Vaping Use: Never used  Substance Use Topics  . Alcohol use: Not Currently  . Drug use: No    Home  Medications Prior to Admission medications   Medication Sig Start Date End Date Taking? Authorizing Provider  acetaminophen (TYLENOL) 500 MG tablet Take 500 mg by mouth every 6 (six) hours as needed for fever.    [provider]  amLODipine (NORVASC) 5 MG tablet Take 1 tablet (5 mg total) by mouth daily. 09/07/19   Libby Maw, MD  atorvastatin (LIPITOR) 20 MG tablet Take 1 tablet (20 mg total) by mouth daily. 09/07/19   Ethelene Hal,  Mortimer Fries, MD  Black Cohosh (REMIFEMIN) 20 MG TABS Take 40 mg by mouth daily.    [provider]  lisinopril (ZESTRIL) 10 MG tablet Take 1 tablet (10 mg total) by mouth daily. 09/07/19   Libby Maw, MD  Multiple Vitamins-Minerals (HAIR/SKIN/NAILS/BIOTIN PO) Take 1 tablet by mouth daily.    [provider]  nitroGLYCERIN (NITROSTAT) 0.4 MG SL tablet Place 1 tablet (0.4 mg total) under the tongue every 5 (five) minutes as needed for up to 25 days for chest pain. 09/06/19 10/01/19  Adrian Prows, MD  ondansetron (ZOFRAN ODT) 4 MG disintegrating tablet Take 1 tablet (4 mg total) by mouth every 8 (eight) hours as needed for nausea or vomiting. 10/18/19   Little Ishikawa, MD  predniSONE (DELTASONE) 10 MG tablet Take 4 tablets (40 mg total) by mouth daily for 4 days, THEN 3 tablets (30 mg total) daily for 4 days, THEN 2 tablets (20 mg total) daily for 4 days, THEN 1 tablet (10 mg total) daily for 4 days. 10/18/19 11/03/19  Little Ishikawa, MD  promethazine (PHENERGAN) 25 MG tablet Take 1 tablet (25 mg total) by mouth every 8 (eight) hours as needed for nausea or vomiting. 10/14/19   Libby Maw, MD    Allergies    Keflex [cephalexin] and Penicillins  Review of Systems   Review of Systems  Constitutional: Positive for chills and fever.  HENT: Negative for sore throat.   Eyes: Negative for visual disturbance.  Respiratory: Positive for cough and shortness of breath. Negative for hemoptysis.   Cardiovascular:  Negative for chest pain.  Gastrointestinal: Positive for nausea and vomiting. Negative for abdominal pain.  Genitourinary: Negative for dysuria.  Musculoskeletal: Positive for myalgias.  Skin: Negative for rash.  Neurological: Negative for speech difficulty.    Physical Exam Updated Vital Signs BP (!) 147/86 (BP Location: Left Arm)   Pulse (!) 103   Temp 98.3 F (36.8 C) (Oral)   Resp (!) 24   SpO2 (!) 87%   Physical Exam Vitals and nursing note reviewed.  Constitutional:      General: She is not in acute distress.    Appearance: Normal appearance. She is well-developed.  HENT:     Head: Normocephalic and atraumatic.  Eyes:     Conjunctiva/sclera: Conjunctivae normal.  Cardiovascular:     Rate and Rhythm: Regular rhythm. Tachycardia present.     Heart sounds: No murmur heard.   Pulmonary:     Effort: Tachypnea and accessory muscle usage present. No respiratory distress.     Breath sounds: Normal breath sounds.  Abdominal:     Palpations: Abdomen is soft.     Tenderness: There is no abdominal tenderness.  Musculoskeletal:        General: Normal range of motion.     Cervical back: Neck supple.     Right lower leg: No tenderness.     Left lower leg: No tenderness.  Skin:    General: Skin is warm and dry.     Capillary Refill: Capillary refill takes less than 2 seconds.  Neurological:     General: No focal deficit present.     Mental Status: She is alert.     ED Results / Procedures / Treatments   Labs (all labs ordered are listed, but only abnormal results are displayed) Labs Reviewed  COMPREHENSIVE METABOLIC PANEL - Abnormal; Notable for the following components:      Result Value   Glucose, Bld 144 (*)  All other components within normal limits  C-REACTIVE PROTEIN - Abnormal; Notable for the following components:   CRP 6.7 (*)    All other components within normal limits  FERRITIN - Abnormal; Notable for the following components:   Ferritin 612 (*)     All other components within normal limits  CBC WITH DIFFERENTIAL/PLATELET  D-DIMER, QUANTITATIVE (NOT AT [facility])    EKG None  Radiology DG Chest Port 1 View  Result Date: 10/17/2019 CLINICAL DATA:  COVID positive EXAM: PORTABLE CHEST 1 VIEW COMPARISON:  04/21/2018 FINDINGS: Possible subtle peripheral infiltrate in the left mid and upper lung. No consolidation or effusion. Normal heart size. No pneumothorax. IMPRESSION: Possible subtle peripheral infiltrate in the left mid to upper lung. Electronically Signed   By: Donavan Foil M.D.   On: 10/17/2019 01:54    Procedures Procedures (including critical care time)  Medications Ordered in ED Medications  remdesivir 100 mg in sodium chloride 0.9 % 100 mL IVPB (100 mg Intravenous New Bag/Given 10/19/19 0937)  amLODipine (NORVASC) tablet 5 mg (5 mg Oral Given 10/19/19 0944)  atorvastatin (LIPITOR) tablet 20 mg (20 mg Oral Given 10/19/19 0943)  lisinopril (ZESTRIL) tablet 10 mg (10 mg Oral Given 10/19/19 0943)  enoxaparin (LOVENOX) injection 40 mg (40 mg Subcutaneous Given 10/19/19 0943)  lactated ringers infusion ( Intravenous New Bag/Given 10/19/19 0304)  albuterol (VENTOLIN HFA) 108 (90 Base) MCG/ACT inhaler 2 puff (2 puffs Inhalation Given 10/19/19 0826)  albuterol (VENTOLIN HFA) 108 (90 Base) MCG/ACT inhaler 2 puff (has no administration in time range)  dexamethasone (DECADRON) injection 6 mg (has no administration in time range)  guaiFENesin-dextromethorphan (ROBITUSSIN DM) 100-10 MG/5ML syrup 10 mL (has no administration in time range)  ascorbic acid (VITAMIN C) tablet 500 mg (500 mg Oral Given 10/19/19 0943)  zinc sulfate capsule 220 mg (220 mg Oral Given 10/19/19 0943)  acetaminophen (TYLENOL) tablet 650 mg (has no administration in time range)  polyethylene glycol (MIRALAX / GLYCOLAX) packet 17 g (has no administration in time range)  ondansetron (ZOFRAN) tablet 4 mg (has no administration in time range)    Or  ondansetron (ZOFRAN) injection  4 mg (has no administration in time range)  dexamethasone (DECADRON) injection 10 mg (10 mg Intravenous Given 10/18/19 2152)    ED Course  I have reviewed the triage vital signs and the nursing notes.  Pertinent labs & imaging results that were available during my care of the patient were reviewed by me and considered in my medical decision making (see chart for details).  Clinical Course as of Oct 18 1013  Mon Oct 18, 2019  2306 Discussed with Dr. Cyd Silence [facility] who will evaluate the patient for admission.   [MB]    Clinical Course User Index [MB] Hayden Rasmussen, MD   MDM Rules/Calculators/A&P                         Calley Drenning was evaluated in Emergency Department on 10/18/2019 for the symptoms described in the history of present illness. She was evaluated in the context of the global COVID-19 pandemic, which necessitated consideration that the patient might be at risk for infection with the SARS-CoV-2 virus that causes COVID-19. Institutional protocols and algorithms that pertain to the evaluation of patients at risk for COVID-19 are in a state of rapid change based on information released by regulatory bodies including the CDC and federal and state organizations. These policies and algorithms were followed during the patient's  care in the ED. This patient complains of worsening dyspnea on exertion in the setting of Covid; this involves an extensive number of treatment Options and is a complaint that carries with it a high risk of complications and Morbidity. The differential includes Covid with progressive worsening, pneumonia, hypoxia, metabolic derangement, pneumothorax, PE  I ordered, reviewed and interpreted labs, which included CBC with normal white count normal hemoglobin, chemistries normal other than elevated glucose, elevated inflammatory markers I ordered medication Decadron and remdesivir I ordered imaging studies which included chest x-ray and I  independently    visualized and interpreted imaging which showed no acute findings Previous records obtained and reviewed in epic including recent admission and discharge I consulted [facility] Dr. Cyd Silence and discussed lab and imaging findings  Critical Interventions: None  After the interventions stated above, I reevaluated the patient and found patient still to be tachypneic and borderline hypoxic, definitely desats with any exertion.  Will need readmission to the hospital for continued management of her Covid symptoms.   Final Clinical Impression(s) / ED Diagnoses Final diagnoses:  Acute respiratory failure with hypoxia ([place])  COVID-19 virus infection    Rx / DC Orders ED Discharge Orders    None       Hayden Rasmussen, MD 10/19/19 1017

## 2019-10-19 ENCOUNTER — Encounter (HOSPITAL_COMMUNITY): Payer: Self-pay | Admitting: Internal Medicine

## 2019-10-19 ENCOUNTER — Ambulatory Visit (HOSPITAL_COMMUNITY): Payer: 59 | Attending: Pulmonary Disease

## 2019-10-19 DIAGNOSIS — J1282 Pneumonia due to coronavirus disease 2019: Secondary | ICD-10-CM | POA: Diagnosis present

## 2019-10-19 DIAGNOSIS — J9601 Acute respiratory failure with hypoxia: Secondary | ICD-10-CM | POA: Diagnosis present

## 2019-10-19 DIAGNOSIS — I10 Essential (primary) hypertension: Secondary | ICD-10-CM | POA: Diagnosis present

## 2019-10-19 DIAGNOSIS — U071 COVID-19: Secondary | ICD-10-CM | POA: Diagnosis present

## 2019-10-19 DIAGNOSIS — K219 Gastro-esophageal reflux disease without esophagitis: Secondary | ICD-10-CM | POA: Diagnosis present

## 2019-10-19 DIAGNOSIS — Z87891 Personal history of nicotine dependence: Secondary | ICD-10-CM | POA: Diagnosis not present

## 2019-10-19 DIAGNOSIS — Z881 Allergy status to other antibiotic agents status: Secondary | ICD-10-CM | POA: Diagnosis not present

## 2019-10-19 DIAGNOSIS — E782 Mixed hyperlipidemia: Secondary | ICD-10-CM | POA: Diagnosis present

## 2019-10-19 DIAGNOSIS — Z83438 Family history of other disorder of lipoprotein metabolism and other lipidemia: Secondary | ICD-10-CM | POA: Diagnosis not present

## 2019-10-19 DIAGNOSIS — Z880 Allergy status to penicillin: Secondary | ICD-10-CM | POA: Diagnosis not present

## 2019-10-19 DIAGNOSIS — Z8249 Family history of ischemic heart disease and other diseases of the circulatory system: Secondary | ICD-10-CM | POA: Diagnosis not present

## 2019-10-19 DIAGNOSIS — Z833 Family history of diabetes mellitus: Secondary | ICD-10-CM | POA: Diagnosis not present

## 2019-10-19 DIAGNOSIS — Z6837 Body mass index (BMI) 37.0-37.9, adult: Secondary | ICD-10-CM | POA: Diagnosis not present

## 2019-10-19 HISTORY — DX: Acute respiratory failure with hypoxia: J96.01

## 2019-10-19 LAB — FERRITIN: Ferritin: 612 ng/mL — ABNORMAL HIGH (ref 11–307)

## 2019-10-19 LAB — C-REACTIVE PROTEIN: CRP: 6.7 mg/dL — ABNORMAL HIGH (ref <1.0)

## 2019-10-19 MED ORDER — ZINC SULFATE 220 (50 ZN) MG PO CAPS
220.0000 mg | ORAL_CAPSULE | Freq: Every day | ORAL | Status: DC
  Administered 2019-10-19 – 2019-10-21 (×3): 220 mg via ORAL
  Filled 2019-10-19 (×3): qty 1

## 2019-10-19 MED ORDER — POLYETHYLENE GLYCOL 3350 17 G PO PACK: 17.0000 g | PACK | Freq: Every day | ORAL | Status: DC | PRN

## 2019-10-19 MED ORDER — ATORVASTATIN CALCIUM 20 MG PO TABS
20.0000 mg | ORAL_TABLET | Freq: Every day | ORAL | Status: DC
  Administered 2019-10-19 – 2019-10-21 (×3): 20 mg via ORAL
  Filled 2019-10-19: qty 1
  Filled 2019-10-19 (×2): qty 2

## 2019-10-19 MED ORDER — AMLODIPINE BESYLATE 5 MG PO TABS
5.0000 mg | ORAL_TABLET | Freq: Every day | ORAL | Status: DC
  Administered 2019-10-19 – 2019-10-21 (×3): 5 mg via ORAL
  Filled 2019-10-19 (×3): qty 1

## 2019-10-19 MED ORDER — LISINOPRIL 10 MG PO TABS
10.0000 mg | ORAL_TABLET | Freq: Every day | ORAL | Status: DC
  Administered 2019-10-19 – 2019-10-21 (×3): 10 mg via ORAL
  Filled 2019-10-19 (×3): qty 1

## 2019-10-19 MED ORDER — ALUM & MAG HYDROXIDE-SIMETH 200-200-20 MG/5ML PO SUSP: 30.0000 mL | Freq: Four times a day (QID) | ORAL | Status: DC | PRN

## 2019-10-19 MED ORDER — ONDANSETRON HCL 4 MG PO TABS: 4.0000 mg | ORAL_TABLET | Freq: Four times a day (QID) | ORAL | Status: DC | PRN

## 2019-10-19 MED ORDER — DEXAMETHASONE SODIUM PHOSPHATE 10 MG/ML IJ SOLN
6.0000 mg | INTRAMUSCULAR | Status: DC
  Administered 2019-10-19 – 2019-10-20 (×2): 6 mg via INTRAVENOUS
  Filled 2019-10-19 (×2): qty 1

## 2019-10-19 MED ORDER — ACETAMINOPHEN 325 MG PO TABS
650.0000 mg | ORAL_TABLET | Freq: Four times a day (QID) | ORAL | Status: DC | PRN
  Administered 2019-10-20: 650 mg via ORAL
  Filled 2019-10-19: qty 2

## 2019-10-19 MED ORDER — ALBUTEROL SULFATE HFA 108 (90 BASE) MCG/ACT IN AERS
2.0000 | INHALATION_SPRAY | Freq: Four times a day (QID) | RESPIRATORY_TRACT | Status: DC
  Administered 2019-10-19 – 2019-10-21 (×9): 2 via RESPIRATORY_TRACT
  Filled 2019-10-19 (×2): qty 6.7

## 2019-10-19 MED ORDER — ASCORBIC ACID 500 MG PO TABS
500.0000 mg | ORAL_TABLET | Freq: Every day | ORAL | Status: DC
  Administered 2019-10-19 – 2019-10-21 (×3): 500 mg via ORAL
  Filled 2019-10-19 (×3): qty 1

## 2019-10-19 MED ORDER — LACTATED RINGERS IV SOLN: INTRAVENOUS | Status: DC

## 2019-10-19 MED ORDER — ENOXAPARIN SODIUM 40 MG/0.4ML ~~LOC~~ SOLN
40.0000 mg | SUBCUTANEOUS | Status: DC
  Administered 2019-10-19: 40 mg via SUBCUTANEOUS
  Filled 2019-10-19: qty 0.4

## 2019-10-19 MED ORDER — ONDANSETRON HCL 4 MG/2ML IJ SOLN: 4.0000 mg | Freq: Four times a day (QID) | INTRAMUSCULAR | Status: DC | PRN

## 2019-10-19 MED ORDER — GUAIFENESIN-DM 100-10 MG/5ML PO SYRP
10.0000 mL | ORAL_SOLUTION | ORAL | Status: DC | PRN
  Filled 2019-10-19: qty 10

## 2019-10-19 MED ORDER — ALBUTEROL SULFATE HFA 108 (90 BASE) MCG/ACT IN AERS: 2.0000 | INHALATION_SPRAY | RESPIRATORY_TRACT | Status: DC | PRN

## 2019-10-19 MED ORDER — FAMOTIDINE 20 MG PO TABS
20.0000 mg | ORAL_TABLET | Freq: Every day | ORAL | Status: DC
  Administered 2019-10-19 – 2019-10-21 (×3): 20 mg via ORAL
  Filled 2019-10-19 (×3): qty 1

## 2019-10-19 NOTE — ED Notes (Signed)
Pt assisted to [facility]

## 2019-10-19 NOTE — Progress Notes (Signed)
PROGRESS NOTE    [name]  HGD:[id number]  DOB: September 14, 1970  PCP: Libby Maw, MD Admit date:10/18/2019 Chief compliant: dyspnea 49 year old female with past medical history of hyperlipidemia, obesity, gastroesophageal reflux disease and hypertension who presents to [facility] emergency department with complaints of shortness of breath and associated generalized body aches, chills and fevers x 1-2 weeks.Patient was seen at [facility] on 8/22 for similar symptoms as well as n/v and Covid testing was performed, found to be positive. Patient was sent home with conservative management. Reportedly, her husband also tested positive for COVID. Her condition however worsened with ongoing nausea/vomiting, shortness of breath and cough, presented again to ED 8/29. CT abd unremarkable.O2 sats down to 91% on RA. Patient admitted but was held overnight in the emergency department waiting for bed. She was much improved the next day and able to ambulate--d/ced home with steroid taper and remained on Remdesevir infusions to be completed at infusion center. Upon arrival home, patient states that she was so short of breath she barely made it  into her home.  Patient used her home pulse oximeter - saturations were dropping to the mid 80s with minimal exertion- she called PCP who instructed her to come back to the ED again on 8/30  Hospital course: Patient admitted to [facility] for completion of IV remdesevir and steroids as inpatient.   Subjective:  Patient still in ED, awaiting bed availability. Appears in no acute distress except for dyspnea while talking full sentences. Requiring 3 lits o2   Objective: Vitals:   10/19/19 0600 10/19/19 0700 10/19/19 0800 10/19/19 0900  BP: (!) 144/79 (!) 151/83 (!) 152/91 (!) 175/99  Pulse: (!) 102 (!) 104 97 (!) 107  Resp: 19 15 17 18   Temp:   98.3 F (36.8 C)   TempSrc:   Oral   SpO2: 90% 93% 91% 93%   No intake or output data in the 24 hours ending  10/19/19 1033 There were no vitals filed for this visit.  Physical Examination:  General: heavy built, no acute distress , mild dyspnea on talking full sentences noted Head ENT: Atraumatic normocephalic, PERRLA, neck supple Heart: S1-S2 heard, regular rate and rhythm, no murmurs.  No leg edema noted Lungs: Distant but equal air entry bilaterally,scattered wheezing, no accessory muscle use Abdomen: Bowel sounds heard, soft, nontender, nondistended. No organomegaly.  No CVA tenderness Extremities: No pedal edema.  No cyanosis or clubbing. Neurological: Awake alert oriented x3, no focal weakness or numbness, strength and sensations to crude touch intact Skin: No wounds or rashes.   Data Reviewed: I have personally reviewed following labs and imaging studies  CBC: Recent Labs  Lab 10/17/19 0100 10/18/19 0500 10/18/19 2157  WBC 6.6 5.5 6.8  NEUTROABS 4.5 3.5 4.3  HGB 13.5 12.6 13.0  HCT 42.0 38.4 39.9  MCV 90.9 90.6 89.9  PLT 258 301 962   Basic Metabolic Panel: Recent Labs  Lab 10/17/19 0100 10/18/19 0500 10/18/19 2157  NA 139 138 139  K 3.6 3.9 3.8  CL 100 102 102  CO2 25 26 25   GLUCOSE 131* 150* 144*  BUN 8 9 13   CREATININE 0.88 0.69 0.73  CALCIUM 8.9 8.8* 8.9   GFR: Estimated Creatinine Clearance: 96.8 mL/min (by C-G formula based on SCr of 0.73 mg/dL). Liver Function Tests: Recent Labs  Lab 10/17/19 0100 10/18/19 0500 10/18/19 2157  AST 33 38 37  ALT 22 28 29   ALKPHOS 50 42 45  BILITOT 0.8 0.6 0.8  PROT 7.4 7.5 8.0  ALBUMIN 4.2 3.7 4.0   No results for input(s): LIPASE, AMYLASE in the last 168 hours. No results for input(s): AMMONIA in the last 168 hours. Coagulation Profile: No results for input(s): INR, PROTIME in the last 168 hours. Cardiac Enzymes: No results for input(s): CKTOTAL, CKMB, CKMBINDEX, TROPONINI in the last 168 hours. BNP (last 3 results) No results for input(s): PROBNP in the last 8760 hours. HbA1C: No results for input(s): HGBA1C  in the last 72 hours. CBG: No results for input(s): GLUCAP in the last 168 hours. Lipid Profile: Recent Labs    10/17/19 1359  TRIG 85   Thyroid Function Tests: No results for input(s): TSH, T4TOTAL, FREET4, T3FREE, THYROIDAB in the last 72 hours. Anemia Panel: Recent Labs    10/18/19 0500 10/18/19 2157  FERRITIN 469* 612*   Sepsis Labs: Recent Labs  Lab 10/17/19 1359 10/17/19 1538  PROCALCITON <0.10  --   LATICACIDVEN 1.3 1.3    Recent Results (from the past 240 hour(s))  SARS Coronavirus 2 by RT PCR (hospital order, performed in [facility] hospital lab) Nasopharyngeal Nasopharyngeal Swab     Status: Abnormal   Collection Time: 10/10/19  9:06 AM   Specimen: Nasopharyngeal Swab  Result Value Ref Range Status   SARS Coronavirus 2 POSITIVE (A) NEGATIVE Final    Comment: RESULT CALLED TO, READ BACK BY AND VERIFIED WITH: [place] [id number] PHILLIPS C (NOTE) SARS-CoV-2 target nucleic acids are DETECTED  SARS-CoV-2 RNA is generally detectable in upper respiratory specimens  during the acute phase of infection.  Positive results are indicative  of the presence of the identified virus, but do not rule out bacterial infection or co-infection with other pathogens not detected by the test.  Clinical correlation with patient history and  other diagnostic information is necessary to determine patient infection status.  The expected result is negative.  Fact Sheet for Patients:   StrictlyIdeas.no   Fact Sheet for Healthcare Providers:   BankingDealers.co.za    This test is not yet approved or cleared by the [place] FDA and  has been authorized for detection and/or diagnosis of SARS-CoV-2 by FDA under an Emergency Use Authorization (EUA).  This EUA will remain in effect (meaning this  test can be used) for the duration of  the COVID-19 declaration under Section 564(b)(1) of the Act, 21 U.S.C. section  360-bbb-3(b)(1), unless the authorization is terminated or revoked sooner.  Performed at [facility], [address] [place], [place] [address]   Blood Culture (routine x 2)     Status: None (Preliminary result)   Collection Time: 10/17/19  1:59 PM   Specimen: BLOOD LEFT HAND  Result Value Ref Range Status   Specimen Description   Final    BLOOD LEFT HAND Performed at [facility], [place] [address], [place], [place] [address]    Special Requests   Final    BOTTLES DRAWN AEROBIC AND ANAEROBIC Blood Culture results may not be optimal due to an inadequate volume of blood received in culture bottles Performed at [facility] [address], [place], [place] [address]    Culture   Final    NO GROWTH 2 DAYS Performed at [facility] [address] [place], [place] [address]    Report Status PENDING  Incomplete  Blood Culture (routine x 2)     Status: None (Preliminary result)   Collection Time: 10/17/19  1:59 PM  Specimen: BLOOD  Result Value Ref Range Status   Specimen Description   Final    BLOOD RIGHT ANTECUBITAL Performed at [facility] [address] [place], [place] [address]    Special Requests   Final    BOTTLES DRAWN AEROBIC AND ANAEROBIC Blood Culture results may not be optimal due to an inadequate volume of blood received in culture bottles Performed at [facility] [address], [place], [place] [address]    Culture   Final    NO GROWTH 2 DAYS Performed at [facility] [address], [place], [place] [address]    Report Status PENDING  Incomplete      Radiology Studies: No results found.    Scheduled Meds:  albuterol  2 puff Inhalation Q6H   amLODipine  5 mg Oral Daily   vitamin C  500 mg Oral Daily   atorvastatin  20 mg Oral Daily   dexamethasone (DECADRON) injection  6 mg Intravenous Q24H   enoxaparin (LOVENOX) injection  40 mg Subcutaneous Q24H    lisinopril  10 mg Oral Daily   zinc sulfate  220 mg Oral Daily   Continuous Infusions:  lactated ringers 125 mL/hr at 10/19/19 0304   remdesivir 100 mg in NS 100 mL 100 mg (10/19/19 0937)      Assessment/Plan:  1. COVID 19 infection with acute hypoxic respiratory failure: POA. Requiring 2-2.5 lits o2. Resumed Remdesevir infusion and IV steroids ( days 3 ) as unable to sustain outpatient plan. Taper 02 as tolerated. Continue IVF/ vitamins/ MDI/ antitussives   2. HTN: resumed home therapy  3. Hyperlipidemia: resumed home therapy  4. Class 2 severe obesity due to excess calories with serious comorbidity and body mass index (BMI) of 37.0 to 37.9 in adult-Once patient is clinically improved will counsel patient on caloric restriction and regular physical activity.  DVT prophylaxis: lovenox Code Status: Full code Family / Patient Communication:  Disposition Plan:   Status is: Observation  The patient will require care spanning > 2 midnights and should be moved to inpatient because: IV treatments appropriate due to intensity of illness or inability to take PO, failed outpatient management strategies  Dispo: The patient is from: Home              Anticipated d/c is to: Home              Anticipated d/c date is: 2 days              Patient currently is not medically stable to d/c.           Time spent: 35 mins     >50% time spent in reviewing records, discussions with care team and coordination of care.    [place] Shi, MD [facility] Pager in [place]  If 7PM-7AM, please contact night-coverage [email or web address] 10/19/2019, 10:33 AM

## 2019-10-19 NOTE — H&P (Signed)
History and Physical    [name] YNW:[id number] DOB: Jun 18, 1970 DOA: 10/18/2019  PCP: Libby Maw, MD  Patient coming from: Home   Chief Complaint:  Chief Complaint  Patient presents with  . Shortness of Breath     HPI:    49 year old female with past medical history of hyperlipidemia, obesity, gastroesophageal reflux disease and hypertension who presents to [facility] emergency department with complaints of shortness of breath and hypoxia.  Patient explains that nearly 1-1/2 weeks ago she began to develop a constellation of generalized body aches, chills and fevers.  Early in the course of her illness, she developed intense nausea and frequent bouts of vomiting.  Patient was seen at [facility] emergency department on 8/22 where Covid testing was performed and found to be positive.  Patient was sent home with conservative management.  In the days that followed, patient's nausea vomiting generalized muscle aches and weakness continued.  Patient began to develop associated cough and shortness of breath.  Shortness of breath was mild to moderate intensity but progressively was becoming more severe.  Shortness breath is worse with exertion and improved with rest.  Upon further questioning patient denies recent travel but does endorse that her husband has also been ill with similar symptoms and has been diagnosed with Covid.  Patient eventually presented to [facility] long hospital with ongoing nausea vomiting shortness of breath and cough.  Patient was initially admitted to the hospital service.  Patient was held overnight in the emergency department waiting for bed as the patient received initial doses of intravenous steroids and remdesivir.  Following morning the patient was reevaluated by the hospital service.  Due to the fact the patient did not seem to be requiring oxygen on that assessment and seen somewhat symptomatically improved patient was discharged  home on 8/30.  Upon arrival home, patient states that she was so short of breath she barely made into her home.  Patient used her home pulse oximeter to determine that her oxygen saturations were dropping to the mid 80s with minimal exertion.  Pulse ox was also improving at 90-92% at rest.  Patient continued to experience severe shortness of breath and at this point called her physician who instructed her to come back to the emergency department for repeat evaluation.  Upon repeat evaluation in the emergency department, patient arrived with documented oxygen saturations of 87% on room air.  The emergency department provider reports and observed respiratory rate of 30 breaths/min.  Patient was placed on supplemental oxygen and the hospitalist group has again been called to assess the patient for admission the hospital.  At this point, the patient states that her nausea and vomiting have improved substantially but the patient continues to experience generalized weakness, muscle aches, shortness of breath and dry nonproductive cough.   Review of Systems:   Review of Systems  Constitutional: Positive for malaise/fatigue.  Respiratory: Positive for cough and shortness of breath.   Gastrointestinal: Positive for nausea and vomiting.  Musculoskeletal: Positive for myalgias.  Neurological: Positive for weakness.  All other systems reviewed and are negative.     Past Medical History:  Diagnosis Date  . Abnormal Pap smear 2000   [facility] Biopsy  . Allergy   . Arthritis    hands  . Back pain   . Blood transfusion without reported diagnosis 2005  . Breast fibroadenoma   . Diverticulitis of colon with perforation 05/20/2014  . GERD (gastroesophageal reflux disease)   . H/O metrorrhagia  12/2006  . H/O myomectomy 07-2006   robotic  . H/O sinusitis   . Headache(784.0)   . Heart murmur   . History of conization of cervix 2000  . History of ectopic pregnancy 2005   r salpyngectomy  . History of  hysterectomy, supracervical 08-2008   robotic  . History of syphilis   . History of syphilis   . Hx of adenomatous polyp of colon 12/31/2017  . Hx of herpes simplex type 2 infection   . Hyperlipidemia   . Hypertension   . Ileostomy present ([place]) 10/26/2014  . Intra-abdominal abscess ([place]) 07/21/2014  . PMS (premenstrual syndrome)     Past Surgical History:  Procedure Laterality Date  . ABDOMINAL HYSTERECTOMY    . BREAST LUMPECTOMY  10/11   rt-neg  . BREAST SURGERY  2004   fibroademoma  . CERVICAL CONE BIOPSY  2000  . COLON RESECTION N/A 07/06/2014   Procedure: LAPAROSCOPIC LOOP ILEOSTOMY WITH PLACEMENT OF PELVIC DRAIN;  Surgeon: Donnie Mesa, MD;  Location: [place];  Service: General;  Laterality: N/A;  . COLON SURGERY     diverticulitis   . COLONOSCOPY    . DILATION AND CURETTAGE OF UTERUS  2009  . DIVERTING ILEOSTOMY Right 06/2014  . ECTOPIC PREGNANCY SURGERY  2005   R salpyngectomy  . ILEOSTOMY N/A 10/26/2014   Procedure: LOOP ILEOSTOMY REVERSAL;  Surgeon: Donnie Mesa, MD;  Location: [place];  Service: General;  Laterality: N/A;  . LAPAROSCOPIC SIGMOID COLECTOMY N/A 06/30/2014   Procedure: LAPAROSCOPIC ASSISTED SIGMOID COLECTOMY;  Surgeon: Donnie Mesa, MD;  Location: [place];  Service: General;  Laterality: N/A;  . MYOMECTOMY  2008   robotic  . POLYPECTOMY  2009  . ROBOTIC ASSISTED LAP VAGINAL HYSTERECTOMY  2010   supracervical  . SALPINGECTOMY  2005  . TRIGGER FINGER RELEASE Right 09/28/2012   Procedure: RELEASE TRIGGER FINGER/A-1 PULLEY RIGHT THUMB;  Surgeon: Jolyn Nap, MD;  Location: [place];  Service: Orthopedics;  Laterality: Right;  . ULNAR NERVE TRANSPOSITION Right 12/01/2018   Procedure: ULNAR NERVE DECOMPRESSION;  Surgeon: Daryll Brod, MD;  Location: [place];  Service: Orthopedics;  Laterality: Right;  AXILLARY     reports that she quit smoking about 8 years ago. Her smoking use included cigarettes. She has a 20.00 pack-year  smoking history. She has never used smokeless tobacco. She reports previous alcohol use. She reports that she does not use drugs.  Allergies  Allergen Reactions  . Keflex [Cephalexin] Anaphylaxis  . Penicillins Anaphylaxis    Has patient had a PCN reaction causing immediate rash, facial/tongue/throat swelling, SOB or lightheadedness with hypotension: Yes Has patient had a PCN reaction causing severe rash involving mucus membranes or skin necrosis: No Has patient had a PCN reaction that required hospitalization: No Has patient had a PCN reaction occurring within the last 10 years: No If all of the above answers are "NO", then may proceed with Cephalosporin use.     Family History  Problem Relation Age of Onset  . Hypertension Paternal Grandmother   . Diabetes Paternal Grandmother   . Hypertension Maternal Grandmother   . Sarcoidosis Mother   . Hypertension Mother   . Hyperlipidemia Mother   . Thyroid disease Maternal Aunt   . Mental illness Cousin   . Fibroids Sister   . Colon cancer Neg Hx   . Colon polyps Neg Hx   . Esophageal cancer Neg Hx   . Rectal cancer Neg Hx   .  Stomach cancer Neg Hx      Prior to Admission medications   Medication Sig Start Date End Date Taking? Authorizing Provider  acetaminophen (TYLENOL) 500 MG tablet Take 500 mg by mouth every 6 (six) hours as needed for fever.   Yes [provider]  amLODipine (NORVASC) 5 MG tablet Take 1 tablet (5 mg total) by mouth daily. 09/07/19  Yes Libby Maw, MD  atorvastatin (LIPITOR) 20 MG tablet Take 1 tablet (20 mg total) by mouth daily. 09/07/19  Yes Libby Maw, MD  Black Cohosh (REMIFEMIN) 20 MG TABS Take 40 mg by mouth daily.   Yes [provider]  lisinopril (ZESTRIL) 10 MG tablet Take 1 tablet (10 mg total) by mouth daily. 09/07/19  Yes Libby Maw, MD  Multiple Vitamins-Minerals (HAIR/SKIN/NAILS/BIOTIN PO) Take 1 tablet by mouth daily.   Yes [provider]  nitroGLYCERIN (NITROSTAT) 0.4 MG SL tablet Place 1 tablet (0.4 mg total) under the tongue every 5 (five) minutes as needed for up to 25 days for chest pain. 09/06/19 10/01/19  Adrian Prows, MD  ondansetron (ZOFRAN ODT) 4 MG disintegrating tablet Take 1 tablet (4 mg total) by mouth every 8 (eight) hours as needed for nausea or vomiting. 10/18/19   Little Ishikawa, MD  predniSONE (DELTASONE) 10 MG tablet Take 4 tablets (40 mg total) by mouth daily for 4 days, THEN 3 tablets (30 mg total) daily for 4 days, THEN 2 tablets (20 mg total) daily for 4 days, THEN 1 tablet (10 mg total) daily for 4 days. 10/18/19 11/03/19  Little Ishikawa, MD  promethazine (PHENERGAN) 25 MG tablet Take 1 tablet (25 mg total) by mouth every 8 (eight) hours as needed for nausea or vomiting. 10/14/19   Libby Maw, MD    Physical Exam: Vitals:   10/18/19 2015 10/18/19 2140 10/18/19 2324 10/19/19 0015  BP: (!) 147/86  125/89 126/88  Pulse: (!) 103  95 97  Resp: (!) 24  16 16   Temp: 98.3 F (36.8 C)     TempSrc: Oral     SpO2: (!) 87% 93% 91% 90%    Constitutional: Acute alert and oriented x3, no associated distress.   Skin: no rashes, no lesions, slightly poor skin turgor Eyes: Pupils are equally reactive to light.  No evidence of scleral icterus or conjunctival pallor.  ENMT: Dry mucous membranes noted.  Posterior pharynx clear of any exudate or lesions.   Neck: normal, supple, no masses, no thyromegaly.  No evidence of jugular venous distension.   Respiratory: Mild bibasilar rales without any evidence of wheezing.  Normal respiratory effort. No accessory muscle use.  Cardiovascular: Regular rate and rhythm, no murmurs / rubs / gallops. No extremity edema. 2+ pedal pulses. No carotid bruits.  Chest:   Nontender without crepitus or deformity.   Back:   Nontender without crepitus or deformity. Abdomen: Abdomen is soft and nontender.  No evidence of intra-abdominal masses.  Positive bowel sounds noted in  all quadrants.   Musculoskeletal: No joint deformity upper and lower extremities. Good ROM, no contractures. Normal muscle tone.  Neurologic: CN 2-12 grossly intact. Sensation intact, strength noted to be 5 out of 5 in all 4 extremities.  Patient is following all commands.  Patient is responsive to verbal stimuli.   Psychiatric: Patient exhibits a depressed mood with flat affect.  Patient seems to possess insight as to theircurrent situation.     Labs on Admission: I have personally reviewed following labs  and imaging studies -   CBC: Recent Labs  Lab 10/17/19 0100 10/18/19 0500 10/18/19 2157  WBC 6.6 5.5 6.8  NEUTROABS 4.5 3.5 4.3  HGB 13.5 12.6 13.0  HCT 42.0 38.4 39.9  MCV 90.9 90.6 89.9  PLT 258 301 403   Basic Metabolic Panel: Recent Labs  Lab 10/17/19 0100 10/18/19 0500 10/18/19 2157  NA 139 138 139  K 3.6 3.9 3.8  CL 100 102 102  CO2 25 26 25   GLUCOSE 131* 150* 144*  BUN 8 9 13   CREATININE 0.88 0.69 0.73  CALCIUM 8.9 8.8* 8.9   GFR: Estimated Creatinine Clearance: 96.8 mL/min (by C-G formula based on SCr of 0.73 mg/dL). Liver Function Tests: Recent Labs  Lab 10/17/19 0100 10/18/19 0500 10/18/19 2157  AST 33 38 37  ALT 22 28 29   ALKPHOS 50 42 45  BILITOT 0.8 0.6 0.8  PROT 7.4 7.5 8.0  ALBUMIN 4.2 3.7 4.0   No results for input(s): LIPASE, AMYLASE in the last 168 hours. No results for input(s): AMMONIA in the last 168 hours. Coagulation Profile: No results for input(s): INR, PROTIME in the last 168 hours. Cardiac Enzymes: No results for input(s): CKTOTAL, CKMB, CKMBINDEX, TROPONINI in the last 168 hours. BNP (last 3 results) No results for input(s): PROBNP in the last 8760 hours. HbA1C: No results for input(s): HGBA1C in the last 72 hours. CBG: No results for input(s): GLUCAP in the last 168 hours. Lipid Profile: Recent Labs    10/17/19 1359  TRIG 85   Thyroid Function Tests: No results for input(s): TSH, T4TOTAL, FREET4, T3FREE, THYROIDAB in  the last 72 hours. Anemia Panel: Recent Labs    10/17/19 1359 10/18/19 0500  FERRITIN 475* 469*   Urine analysis:    Component Value Date/Time   COLORURINE YELLOW 10/10/2019 0905   APPEARANCEUR CLEAR 10/10/2019 0905   LABSPEC >1.030 (H) 10/10/2019 0905   PHURINE 5.5 10/10/2019 0905   GLUCOSEU NEGATIVE 10/10/2019 0905   GLUCOSEU NEGATIVE 10/04/2019 0830   HGBUR NEGATIVE 10/10/2019 0905   BILIRUBINUR NEGATIVE 10/10/2019 0905   BILIRUBINUR negative 05/29/2017 1742   KETONESUR NEGATIVE 10/10/2019 0905   PROTEINUR NEGATIVE 10/10/2019 0905   UROBILINOGEN 0.2 10/04/2019 0830   NITRITE NEGATIVE 10/10/2019 0905   LEUKOCYTESUR NEGATIVE 10/10/2019 0905    Radiological Exams on Admission - Personally Reviewed: No results found.  EKG: Personally reviewed.  Rhythm is sinus tachycardia with heart rate of 101 bpm.  No dynamic ST segment changes appreciated.  Assessment/Plan Principal Problem:   COVID-19 virus infection   Patient is exhibiting ongoing symptoms from COVID-19 infection diagnosed on 8/22.   This includes shortness of breath and nonproductive cough with significant dyspnea on exertion.  It seems that associated nausea and vomiting patient experienced 48 hours ago has resolved.  Patient notably has an oxygen requirement on arrival to the emergency department this time, arriving with a O2 saturation of 87% with continued desaturations into the 80s with minimal exertion.   Patient is been placed on 2 and half liters of oxygen via nasal cannula by the emergency department staff.  Placing patient back on scheduled dexamethasone and remdesivir therapy  As needed bronchodilator therapy  Zinc and vitamin C being provided  Hydrating with intravenous isotonic fluids  Admitting to Covid unit, weaning supplemental oxygen as tolerated.  Active Problems:   Essential hypertension   Continue home regimen of antihypertensive therapy    Mixed hyperlipidemia   Continue home  regimen of lipid-lowering therapy  Class 2 severe obesity due to excess calories with serious comorbidity and body mass index (BMI) of 37.0 to 37.9 in adult [facility])   Once patient is clinically improved will counsel patient on caloric restriction and regular physical activity.      Code Status:  Full code Family Communication: deferred   Status is: Observation  The patient remains OBS appropriate and will d/c before 2 midnights.  Dispo: The patient is from: Home              Anticipated d/c is to: Home              Anticipated d/c date is: 2 days              Patient currently is not medically stable to d/c.        Vernelle Emerald MD [facility] Pager [phone]  If 7PM-7AM, please contact night-coverage [email or web address] Use universal [place] password for that web site. If you do not have the password, please call the hospital operator.  10/19/2019, 1:28 AM

## 2019-10-20 ENCOUNTER — Ambulatory Visit (HOSPITAL_COMMUNITY): Payer: Self-pay

## 2019-10-20 DIAGNOSIS — J9601 Acute respiratory failure with hypoxia: Secondary | ICD-10-CM

## 2019-10-20 DIAGNOSIS — I10 Essential (primary) hypertension: Secondary | ICD-10-CM

## 2019-10-20 DIAGNOSIS — Z6837 Body mass index (BMI) 37.0-37.9, adult: Secondary | ICD-10-CM

## 2019-10-20 DIAGNOSIS — E782 Mixed hyperlipidemia: Secondary | ICD-10-CM

## 2019-10-20 LAB — PHOSPHORUS: Phosphorus: 3.4 mg/dL (ref 2.5–4.6)

## 2019-10-20 LAB — CBC WITH DIFFERENTIAL/PLATELET
Abs Immature Granulocytes: 0.32 10*3/uL — ABNORMAL HIGH (ref 0.00–0.07)
Basophils Absolute: 0 10*3/uL (ref 0.0–0.1)
Basophils Relative: 0 %
Eosinophils Absolute: 0 10*3/uL (ref 0.0–0.5)
Eosinophils Relative: 0 %
HCT: 38.2 % (ref 36.0–46.0)
Hemoglobin: 12.4 g/dL (ref 12.0–15.0)
Immature Granulocytes: 3 %
Lymphocytes Relative: 16 %
Lymphs Abs: 2.1 10*3/uL (ref 0.7–4.0)
MCH: 29.2 pg (ref 26.0–34.0)
MCHC: 32.5 g/dL (ref 30.0–36.0)
MCV: 90.1 fL (ref 80.0–100.0)
Monocytes Absolute: 1.3 10*3/uL — ABNORMAL HIGH (ref 0.1–1.0)
Monocytes Relative: 10 %
Neutro Abs: 9.2 10*3/uL — ABNORMAL HIGH (ref 1.7–7.7)
Neutrophils Relative %: 71 %
Platelets: 442 10*3/uL — ABNORMAL HIGH (ref 150–400)
RBC: 4.24 MIL/uL (ref 3.87–5.11)
RDW: 13.2 % (ref 11.5–15.5)
WBC: 13 10*3/uL — ABNORMAL HIGH (ref 4.0–10.5)
nRBC: 0 % (ref 0.0–0.2)

## 2019-10-20 LAB — MAGNESIUM: Magnesium: 2.2 mg/dL (ref 1.7–2.4)

## 2019-10-20 LAB — COMPREHENSIVE METABOLIC PANEL
ALT: 25 U/L (ref 0–44)
AST: 29 U/L (ref 15–41)
Albumin: 3.7 g/dL (ref 3.5–5.0)
Alkaline Phosphatase: 38 U/L (ref 38–126)
Anion gap: 11 (ref 5–15)
BUN: 13 mg/dL (ref 6–20)
CO2: 25 mmol/L (ref 22–32)
Calcium: 8.9 mg/dL (ref 8.9–10.3)
Chloride: 102 mmol/L (ref 98–111)
Creatinine, Ser: 0.63 mg/dL (ref 0.44–1.00)
GFR calc Af Amer: >60 mL/min (ref >60)
GFR calc non Af Amer: >60 mL/min (ref >60)
Glucose, Bld: 135 mg/dL — ABNORMAL HIGH (ref 70–99)
Potassium: 3.8 mmol/L (ref 3.5–5.1)
Sodium: 138 mmol/L (ref 135–145)
Total Bilirubin: 0.8 mg/dL (ref 0.3–1.2)
Total Protein: 7 g/dL (ref 6.5–8.1)

## 2019-10-20 LAB — D-DIMER, QUANTITATIVE: D-Dimer, Quant: <0.27 ug/mL-FEU (ref 0.00–0.50)

## 2019-10-20 LAB — C-REACTIVE PROTEIN: CRP: 1.1 mg/dL — ABNORMAL HIGH (ref <1.0)

## 2019-10-20 MED ORDER — ENOXAPARIN SODIUM 60 MG/0.6ML ~~LOC~~ SOLN
50.0000 mg | SUBCUTANEOUS | Status: DC
  Administered 2019-10-20 – 2019-10-21 (×2): 50 mg via SUBCUTANEOUS
  Filled 2019-10-20: qty 0.5
  Filled 2019-10-20: qty 0.6

## 2019-10-20 NOTE — ED Notes (Signed)
Report give to Kennyth Lose, Therapist, sports.

## 2019-10-20 NOTE — Progress Notes (Signed)
PROGRESS NOTE  [name] TFT:[id number] DOB: June 07, 1970   PCP: Libby Maw, MD  Patient is from: Home.  DOA: 10/18/2019 LOS: 1  Brief Narrative / Interim history: 49 year old female with past medical history of hyperlipidemia, obesity, gastroesophageal reflux disease and hypertension who presents to [facility] emergency department with complaints of shortness of breath and associated generalized body aches, chills and fevers x 1-2 weeks.Patient was seen at [facility] -ED on 8/22 for similar symptoms as well as n/v and Covid testing was performed, found to be positive. Patient was sent home with conservative management. Reportedly, her husband also tested positive for COVID. Her condition however worsened with ongoing nausea/vomiting, shortness of breath and cough, presented again to ED 8/29. CT abd unremarkable.O2 sats down to 91% on RA. Patient admitted but was held overnight in the emergency department waiting for bed. She was much improved the next day and able to ambulate--d/ced home with steroid taper and remained on Remdesevir infusions to be completed at infusion center. Upon arrival home, patient states that she was so short of breath she barely made it  into her home. Patient used her home pulse oximeter - saturations were dropping to the mid 80s with minimal exertion- she called PCP who instructed her to come back to the ED again on 8/30   Hospital course: Patient admitted to [facility] for completion of IV remdesevir and steroids as inpatient.   Subjective: Seen and examined earlier this morning.  No major events overnight of this morning.  She is upset about her nursing care.  She reports shortness of breath, dyspnea on exertion and generalized weakness.  Denies chest pain, GI or UTI symptoms.  Objective: Vitals:   10/20/19 1140 10/20/19 1300 10/20/19 1331 10/20/19 1730  BP: 139/85 140/90 (!) 148/100 (!) 158/90  Pulse: (!) 102 98 (!) 107 96  Resp: (!) 24 19 20 18     Temp:   98.4 F (36.9 C) 98.2 F (36.8 C)  TempSrc:   Oral Oral  SpO2: 94% 94% 95% 96%    Intake/Output Summary (Last 24 hours) at 10/20/2019 1743 Last data filed at 10/20/2019 1500 Gross per 24 hour  Intake 3582.81 ml  Output --  Net 3582.81 ml   There were no vitals filed for this visit.  Examination:  GENERAL: No apparent distress.  Nontoxic. HEENT: MMM.  Vision and hearing grossly intact.  NECK: Supple.  No apparent JVD.  RESP: 93% on 3 L.  No IWOB.  Diminished aeration bilaterally. CVS:  RRR. Heart sounds normal.  ABD/GI/GU: BS+. Abd soft, NTND.  MSK/EXT:  Moves extremities. No apparent deformity. No edema.  SKIN: no apparent skin lesion or wound NEURO: Awake, alert and oriented appropriately.  No apparent focal neuro deficit. PSYCH: Calm. Normal affect.  Procedures:  None  Microbiology summarized: COVID-19 PCR positive.  Assessment & Plan: Acute respiratory failure with hypoxia due to COVID-19 pneumonia: Symptomatic for over 10 days.  Tested positive for 8/22.  Progressive dyspnea and hypoxia.  Bilateral infiltrates on imaging.  Inflammatory markers elevated on presentation but downtrending. Continues to require 3 L to maintain saturation in low 90s. Recent Labs    10/18/19 0500 10/18/19 2157 10/20/19 0500  DDIMER <0.27 0.31 <0.27  FERRITIN 469* 612*  --   CRP 10.7* 6.7* 1.1*  -Continue steroid and remdesivir 8/29>> -Continue supportive care with inhalers, mucolytic's, antitussive, incentive spirometry and vitamins. -Wean oxygen as able  Essential HTN: Normotensive. -Continue home medications  Hyperlipidemia:  -Continue lovastatin.  Class II  obesity: BMI 38. -Encourage lifestyle change to lose weight.  There is no height or weight on file to calculate BMI.         DVT prophylaxis:  Subcu Lovenox  Code Status: Full code Family Communication: Patient and/or RN. Available if any question.  Status is: Inpatient  Remains inpatient appropriate  because:IV treatments appropriate due to intensity of illness or inability to take PO    Dispo: The patient is from: Home              Anticipated d/c is to: Home              Anticipated d/c date is: 2 days              Patient currently is not medically stable to d/c.       Consultants:  None   Sch Meds:  Scheduled Meds: . albuterol  2 puff Inhalation Q6H  . amLODipine  5 mg Oral Daily  . vitamin C  500 mg Oral Daily  . atorvastatin  20 mg Oral Daily  . dexamethasone (DECADRON) injection  6 mg Intravenous Q24H  . enoxaparin (LOVENOX) injection  50 mg Subcutaneous Q24H  . famotidine  20 mg Oral Daily  . lisinopril  10 mg Oral Daily  . zinc sulfate  220 mg Oral Daily   Continuous Infusions: . lactated ringers 125 mL/hr at 10/20/19 1653  . remdesivir 100 mg in NS 100 mL Stopped (10/20/19 1104)   PRN Meds:.acetaminophen, albuterol, alum & mag hydroxide-simeth, guaiFENesin-dextromethorphan, ondansetron **OR** ondansetron (ZOFRAN) IV, polyethylene glycol  Antimicrobials: Anti-infectives (From admission, onward)   Start     Dose/Rate Route Frequency Ordered Stop   10/19/19 1000  remdesivir 100 mg in sodium chloride 0.9 % 100 mL IVPB        100 mg 200 mL/hr over 30 Minutes Intravenous Daily 10/18/19 2109 10/22/19 0959       I have personally reviewed the following labs and images: CBC: Recent Labs  Lab 10/17/19 0100 10/18/19 0500 10/18/19 2157 10/20/19 0500  WBC 6.6 5.5 6.8 13.0*  NEUTROABS 4.5 3.5 4.3 9.2*  HGB 13.5 12.6 13.0 12.4  HCT 42.0 38.4 39.9 38.2  MCV 90.9 90.6 89.9 90.1  PLT 258 301 360 442*   BMP &GFR Recent Labs  Lab 10/17/19 0100 10/18/19 0500 10/18/19 2157 10/20/19 0500  NA 139 138 139 138  K 3.6 3.9 3.8 3.8  CL 100 102 102 102  CO2 25 26 25 25   GLUCOSE 131* 150* 144* 135*  BUN 8 9 13 13   CREATININE 0.88 0.69 0.73 0.63  CALCIUM 8.9 8.8* 8.9 8.9  MG  --   --   --  2.2  PHOS  --   --   --  3.4   Estimated Creatinine Clearance: 96.8  mL/min (by C-G formula based on SCr of 0.63 mg/dL). Liver & Pancreas: Recent Labs  Lab 10/17/19 0100 10/18/19 0500 10/18/19 2157 10/20/19 0500  AST 33 38 37 29  ALT 22 28 29 25   ALKPHOS 50 42 45 38  BILITOT 0.8 0.6 0.8 0.8  PROT 7.4 7.5 8.0 7.0  ALBUMIN 4.2 3.7 4.0 3.7   No results for input(s): LIPASE, AMYLASE in the last 168 hours. No results for input(s): AMMONIA in the last 168 hours. Diabetic: No results for input(s): HGBA1C in the last 72 hours. No results for input(s): GLUCAP in the last 168 hours. Cardiac Enzymes: No results for input(s): CKTOTAL, CKMB, CKMBINDEX, TROPONINI in  the last 168 hours. No results for input(s): PROBNP in the last 8760 hours. Coagulation Profile: No results for input(s): INR, PROTIME in the last 168 hours. Thyroid Function Tests: No results for input(s): TSH, T4TOTAL, FREET4, T3FREE, THYROIDAB in the last 72 hours. Lipid Profile: No results for input(s): CHOL, HDL, LDLCALC, TRIG, CHOLHDL, LDLDIRECT in the last 72 hours. Anemia Panel: Recent Labs    10/18/19 0500 10/18/19 2157  FERRITIN 469* 612*   Urine analysis:    Component Value Date/Time   COLORURINE YELLOW 10/10/2019 0905   APPEARANCEUR CLEAR 10/10/2019 0905   LABSPEC >1.030 (H) 10/10/2019 0905   PHURINE 5.5 10/10/2019 0905   GLUCOSEU NEGATIVE 10/10/2019 0905   GLUCOSEU NEGATIVE 10/04/2019 0830   HGBUR NEGATIVE 10/10/2019 0905   BILIRUBINUR NEGATIVE 10/10/2019 0905   BILIRUBINUR negative 05/29/2017 1742   KETONESUR NEGATIVE 10/10/2019 0905   PROTEINUR NEGATIVE 10/10/2019 0905   UROBILINOGEN 0.2 10/04/2019 0830   NITRITE NEGATIVE 10/10/2019 0905   LEUKOCYTESUR NEGATIVE 10/10/2019 0905   Sepsis Labs: Invalid input(s): PROCALCITONIN, [place]  Microbiology: Recent Results (from the past 240 hour(s))  Blood Culture (routine x 2)     Status: None (Preliminary result)   Collection Time: 10/17/19  1:59 PM   Specimen: BLOOD LEFT HAND  Result Value Ref Range Status    Specimen Description   Final    BLOOD LEFT HAND Performed at [facility], [address], [place], [place] [address]    Special Requests   Final    BOTTLES DRAWN AEROBIC AND ANAEROBIC Blood Culture results may not be optimal due to an inadequate volume of blood received in culture bottles Performed at [facility] [address], [place], [place] [address]    Culture   Final    NO GROWTH 3 DAYS Performed at [facility], [place] [address], [place], [place] [address]    Report Status PENDING  Incomplete  Blood Culture (routine x 2)     Status: None (Preliminary result)   Collection Time: 10/17/19  1:59 PM   Specimen: BLOOD  Result Value Ref Range Status   Specimen Description   Final    BLOOD RIGHT ANTECUBITAL Performed at [facility] [address], [place], [place] [address]    Special Requests   Final    BOTTLES DRAWN AEROBIC AND ANAEROBIC Blood Culture results may not be optimal due to an inadequate volume of blood received in culture bottles Performed at [facility] [address] [place], [place] [address]    Culture   Final    NO GROWTH 3 DAYS Performed at [facility], [place] [address] [place], [place] [address]    Report Status PENDING  Incomplete    Radiology Studies: No results found.   Flemon Kelty T. [place]  If 7PM-7AM, please contact night-coverage [email or web address] 10/20/2019, 5:43 PM

## 2019-10-21 ENCOUNTER — Ambulatory Visit (HOSPITAL_COMMUNITY): Payer: Self-pay

## 2019-10-21 LAB — CBC WITH DIFFERENTIAL/PLATELET
Abs Immature Granulocytes: 0.62 10*3/uL — ABNORMAL HIGH (ref 0.00–0.07)
Basophils Absolute: 0.1 10*3/uL (ref 0.0–0.1)
Basophils Relative: 1 %
Eosinophils Absolute: 0 10*3/uL (ref 0.0–0.5)
Eosinophils Relative: 0 %
HCT: 39 % (ref 36.0–46.0)
Hemoglobin: 12.7 g/dL (ref 12.0–15.0)
Immature Granulocytes: 5 %
Lymphocytes Relative: 16 %
Lymphs Abs: 2.1 10*3/uL (ref 0.7–4.0)
MCH: 29.1 pg (ref 26.0–34.0)
MCHC: 32.6 g/dL (ref 30.0–36.0)
MCV: 89.4 fL (ref 80.0–100.0)
Monocytes Absolute: 1.2 10*3/uL — ABNORMAL HIGH (ref 0.1–1.0)
Monocytes Relative: 9 %
Neutro Abs: 9 10*3/uL — ABNORMAL HIGH (ref 1.7–7.7)
Neutrophils Relative %: 69 %
Platelets: 444 10*3/uL — ABNORMAL HIGH (ref 150–400)
RBC: 4.36 MIL/uL (ref 3.87–5.11)
RDW: 12.7 % (ref 11.5–15.5)
WBC: 13 10*3/uL — ABNORMAL HIGH (ref 4.0–10.5)
nRBC: 0.3 % — ABNORMAL HIGH (ref 0.0–0.2)

## 2019-10-21 LAB — COMPREHENSIVE METABOLIC PANEL
ALT: 26 U/L (ref 0–44)
AST: 27 U/L (ref 15–41)
Albumin: 3.4 g/dL — ABNORMAL LOW (ref 3.5–5.0)
Alkaline Phosphatase: 41 U/L (ref 38–126)
Anion gap: 10 (ref 5–15)
BUN: 13 mg/dL (ref 6–20)
CO2: 26 mmol/L (ref 22–32)
Calcium: 9 mg/dL (ref 8.9–10.3)
Chloride: 104 mmol/L (ref 98–111)
Creatinine, Ser: 0.65 mg/dL (ref 0.44–1.00)
GFR calc Af Amer: >60 mL/min (ref >60)
GFR calc non Af Amer: >60 mL/min (ref >60)
Glucose, Bld: 136 mg/dL — ABNORMAL HIGH (ref 70–99)
Potassium: 3.9 mmol/L (ref 3.5–5.1)
Sodium: 140 mmol/L (ref 135–145)
Total Bilirubin: 0.7 mg/dL (ref 0.3–1.2)
Total Protein: 6.8 g/dL (ref 6.5–8.1)

## 2019-10-21 LAB — MAGNESIUM: Magnesium: 2.3 mg/dL (ref 1.7–2.4)

## 2019-10-21 LAB — PHOSPHORUS: Phosphorus: 3.8 mg/dL (ref 2.5–4.6)

## 2019-10-21 LAB — C-REACTIVE PROTEIN: CRP: 0.7 mg/dL (ref <1.0)

## 2019-10-21 LAB — D-DIMER, QUANTITATIVE: D-Dimer, Quant: 0.29 ug/mL-FEU (ref 0.00–0.50)

## 2019-10-21 NOTE — Discharge Instructions (Signed)
COVID-19 Frequently Asked Questions COVID-19 (coronavirus disease) is an infection that is caused by a large family of viruses. Some viruses cause illness in people and others cause illness in animals like camels, cats, and bats. In some cases, the viruses that cause illness in animals can spread to humans. Where did the coronavirus come from? In December 2019, [place] told the [facility]) of several cases of lung disease (human respiratory illness). These cases were linked to an open seafood and livestock market in the city of [place]. The link to the seafood and livestock market suggests that the virus may have spread from animals to humans. However, since that first outbreak in December, the virus has also been shown to spread from person to person. What is the name of the disease and the virus? Disease name Early on, this disease was called novel coronavirus. This is because scientists determined that the disease was caused by a new (novel) respiratory virus. The World Health Organization [facility]) has now named the disease COVID-19, or coronavirus disease. Virus name The virus that causes the disease is called severe acute respiratory syndrome coronavirus 2 (SARS-CoV-2). More information on disease and virus naming World Health Organization [facility]): www.who.int/emergencies/diseases/novel-coronavirus-2019/technical-guidance/naming-the-coronavirus-disease-(covid-2019)-and-the-virus-that-causes-it Who is at risk for complications from coronavirus disease? Some people may be at higher risk for complications from coronavirus disease. This includes older adults and people who have chronic diseases, such as heart disease, diabetes, and lung disease. If you are at higher risk for complications, take these extra precautions:  Stay home as much as possible.  Avoid social gatherings and travel.  Avoid close contact with others. Stay at least 6 ft (2 m) away from others, if  possible.  Wash your hands often with soap and water for at least 20 seconds.  Avoid touching your face, mouth, nose, or eyes.  Keep supplies on hand at home, such as food, medicine, and cleaning supplies.  If you must go out in public, wear a cloth face covering or face mask. Make sure your mask covers your nose and mouth. How does coronavirus disease spread? The virus that causes coronavirus disease spreads easily from person to person (is contagious). You may catch the virus by:  Breathing in droplets from an infected person. Droplets can be spread by a person breathing, speaking, singing, coughing, or sneezing.  Touching something, like a table or a doorknob, that was exposed to the virus (contaminated) and then touching your mouth, nose, or eyes. Can I get the virus from touching surfaces or objects? There is still a lot that we do not know about the virus that causes coronavirus disease. Scientists are basing a lot of information on what they know about similar viruses, such as:  Viruses cannot generally survive on surfaces for long. They need a human body (host) to survive.  It is more likely that the virus is spread by close contact with people who are sick (direct contact), such as through: ? Shaking hands or hugging. ? Breathing in respiratory droplets that travel through the air. Droplets can be spread by a person breathing, speaking, singing, coughing, or sneezing.  It is less likely that the virus is spread when a person touches a surface or object that has the virus on it (indirect contact). The virus may be able to enter the body if the person touches a surface or object and then touches his or her face, eyes, nose, or mouth. Can a person spread the virus without having symptoms of the  disease? It may be possible for the virus to spread before a person has symptoms of the disease, but this is most likely not the main way the virus is spreading. It is more likely for the virus  to spread by being in close contact with people who are sick and breathing in the respiratory droplets spread by a person breathing, speaking, singing, coughing, or sneezing. What are the symptoms of coronavirus disease? Symptoms vary from person to person and can range from mild to severe. Symptoms may include:  Fever or chills.  Cough.  Difficulty breathing or feeling short of breath.  Headaches, body aches, or muscle aches.  Runny or stuffy (congested) nose.  Sore throat.  New loss of taste or smell.  Nausea, vomiting, or diarrhea. These symptoms can appear anywhere from 2 to 14 days after you have been exposed to the virus. Some people may not have any symptoms. If you develop symptoms, call your health care provider. People with severe symptoms may need hospital care. Should I be tested for this virus? Your health care provider will decide whether to test you based on your symptoms, history of exposure, and your risk factors. How does a health care provider test for this virus? Health care providers will collect samples to send for testing. Samples may include:  Taking a swab of fluid from the back of your nose and throat, your nose, or your throat.  Taking fluid from the lungs by having you cough up mucus (sputum) into a sterile cup.  Taking a blood sample. Is there a treatment or vaccine for this virus? Currently, there is no vaccine to prevent coronavirus disease. Also, there are no medicines like antibiotics or antivirals to treat the virus. A person who becomes sick is given supportive care, which means rest and fluids. A person may also relieve his or her symptoms by using over-the-counter medicines that treat sneezing, coughing, and runny nose. These are the same medicines that a person takes for the common cold. If you develop symptoms, call your health care provider. People with severe symptoms may need hospital care. What can I do to protect myself and my family from  this virus?     You can protect yourself and your family by taking the same actions that you would take to prevent the spread of other viruses. Take the following actions:  Wash your hands often with soap and water for at least 20 seconds. If soap and water are not available, use alcohol-based hand sanitizer.  Avoid touching your face, mouth, nose, or eyes.  Cough or sneeze into a tissue, sleeve, or elbow. Do not cough or sneeze into your hand or the air. ? If you cough or sneeze into a tissue, throw it away immediately and wash your hands.  Disinfect objects and surfaces that you frequently touch every day.  Stay away from people who are sick.  Avoid going out in public, follow guidance from your state and local health authorities.  Avoid crowded indoor spaces. Stay at least 6 ft (2 m) away from others.  If you must go out in public, wear a cloth face covering or face mask. Make sure your mask covers your nose and mouth.  Stay home if you are sick, except to get medical care. Call your health care provider before you get medical care. Your health care provider will tell you how long to stay home.  Make sure your vaccines are up to date. Ask your health care provider  what vaccines you need. What should I do if I need to travel? Follow travel recommendations from your local health authority, the CDC, and WHO. Travel information and advice  Centers for Disease Control and Prevention (CDC): BodyEditor.hu  World Health Organization [facility]): ThirdIncome.ca Know the risks and take action to protect your health  You are at higher risk of getting coronavirus disease if you are traveling to areas with an outbreak or if you are exposed to travelers from areas with an outbreak.  Wash your hands often and practice good hygiene to lower the risk of catching or spreading the virus. What should I do  if I am sick? General instructions to stop the spread of infection  Wash your hands often with soap and water for at least 20 seconds. If soap and water are not available, use alcohol-based hand sanitizer.  Cough or sneeze into a tissue, sleeve, or elbow. Do not cough or sneeze into your hand or the air.  If you cough or sneeze into a tissue, throw it away immediately and wash your hands.  Stay home unless you must get medical care. Call your health care provider or local health authority before you get medical care.  Avoid public areas. Do not take public transportation, if possible.  If you can, wear a mask if you must go out of the house or if you are in close contact with someone who is not sick. Make sure your mask covers your nose and mouth. Keep your home clean  Disinfect objects and surfaces that are frequently touched every day. This may include: ? Counters and tables. ? Doorknobs and light switches. ? Sinks and faucets. ? Electronics such as phones, remote controls, keyboards, computers, and tablets.  Wash dishes in hot, soapy water or use a dishwasher. Air-dry your dishes.  Wash laundry in hot water. Prevent infecting other household members  Let healthy household members care for children and pets, if possible. If you have to care for children or pets, wash your hands often and wear a mask.  Sleep in a different bedroom or bed, if possible.  Do not share personal items, such as razors, toothbrushes, deodorant, combs, brushes, towels, and washcloths. Where to find more information Centers for Disease Control and Prevention (CDC)  Information and news updates: https://www.butler-gonzalez.com/ World Health Organization [facility])  Information and news updates: MissExecutive.com.ee  Coronavirus health topic: https://www.castaneda.info/  Questions and answers on COVID-19:  OpportunityDebt.at  Global tracker: who.sprinklr.com American Academy of Pediatrics (AAP)  Information for families: www.healthychildren.org/English/health-issues/conditions/chest-lungs/Pages/2019-Novel-Coronavirus.aspx The coronavirus situation is changing rapidly. Check your local health authority website or the CDC and [facility] websites for updates and news. When should I contact a health care provider?  Contact your health care provider if you have symptoms of an infection, such as fever or cough, and you: ? Have been near anyone who is known to have coronavirus disease. ? Have come into contact with a person who is suspected to have coronavirus disease. ? Have traveled to an area where there is an outbreak of COVID-19. When should I get emergency medical care?  Get help right away by calling your local emergency services (911 in the [place] if you have: ? Trouble breathing. ? Pain or pressure in your chest. ? Confusion. ? Blue-tinged lips and fingernails. ? Difficulty waking from sleep. ? Symptoms that get worse. Let the emergency medical personnel know if you think you have coronavirus disease. Summary  A new respiratory virus is spreading from person to person and  causing COVID-19 (coronavirus disease).  The virus that causes COVID-19 appears to spread easily. It spreads from one person to another through droplets from breathing, speaking, singing, coughing, or sneezing.  Older adults and those with chronic diseases are at higher risk of disease. If you are at higher risk for complications, take extra precautions.  There is currently no vaccine to prevent coronavirus disease. There are no medicines, such as antibiotics or antivirals, to treat the virus.  You can protect yourself and your family by washing your hands often, avoiding touching your face, and covering your coughs and sneezes. This information is not intended to replace advice given to you  by your health care provider. Make sure you discuss any questions you have with your health care provider. Document Revised: 12/04/2018 Document Reviewed: 06/02/2018 Elsevier Patient Education  [place] Can Do to Manage Your COVID-19 Symptoms at Home If you have possible or confirmed COVID-19: 1. Stay home from work and school. And stay away from other public places. If you must go out, avoid using any kind of public transportation, ridesharing, or taxis. 2. Monitor your symptoms carefully. If your symptoms get worse, call your healthcare provider immediately. 3.  COVID-19: How to Protect Yourself and Others Know how it spreads There is currently no vaccine to prevent coronavirus disease 2019 (COVID-19). The best way to prevent illness is to avoid being exposed to this virus. The virus is thought to spread mainly from person-to-person. Between people who are in close contact with one another (within about 6 feet). Through respiratory droplets produced when an infected person coughs, sneezes or talks. These droplets can land in the mouths or noses of people who are nearby or possibly be inhaled into the lungs. COVID-19 may be spread by people who are not showing symptoms. Everyone should Clean your hands often Wash your hands often with soap and water for at least 20 seconds especially after you have been in a public place, or after blowing your nose, coughing, or sneezing. If soap and water are not readily available, use a hand sanitizer that contains at least 60% alcohol. Cover all surfaces of your hands and rub them together until they feel dry. Avoid touching your eyes, nose, and mouth with unwashed hands. Avoid close contact Limit contact with others as much as possible. Avoid close contact with people who are sick. Put distance between yourself and other people. Remember that some people without symptoms may be able to spread virus. This is especially  important for people who are at higher risk of getting very GainPain.com.cy Cover your mouth and nose with a mask when around others You could spread COVID-19 to others even if you do not feel sick. Everyone should wear a mask in public settings and when around people not living in their household, especially when social distancing is difficult to maintain. Masks should not be placed on young children under age 76, anyone who has trouble breathing, or is unconscious, incapacitated or otherwise unable to remove the mask without assistance. The mask is meant to protect other people in case you are infected. Do NOT use a facemask meant for a Dietitian. Continue to keep about 6 feet between yourself and others. The mask is not a substitute for social distancing. Cover coughs and sneezes Always cover your mouth and nose with a tissue when you cough or sneeze or use the inside of your elbow. Throw used tissues in the trash. Immediately wash your hands with  soap and water for at least 20 seconds. If soap and water are not readily available, clean your hands with a hand sanitizer that contains at least 60% alcohol. Clean and disinfect Clean AND disinfect frequently touched surfaces daily. This includes tables, doorknobs, light switches, countertops, handles, desks, phones, keyboards, toilets, faucets, and sinks. RackRewards.fr If surfaces are dirty, clean them: Use detergent or soap and water prior to disinfection. Then, use a household disinfectant. You can see a list of EPA-registered household disinfectants here. michellinders.com 10/21/2018 This information is not intended to replace advice given to you by your health care provider. Make sure you discuss any questions you have with your health care provider. Document Revised: 10/29/2018 Document  Reviewed: 08/27/2018 Elsevier Patient Education  2020 [place]. 4.   COVID-19 COVID-19 is a respiratory infection that is caused by a virus called severe acute respiratory syndrome coronavirus 2 (SARS-CoV-2). The disease is also known as coronavirus disease or novel coronavirus. In some people, the virus may not cause any symptoms. In others, it may cause a serious infection. The infection can get worse quickly and can lead to complications, such as: Pneumonia, or infection of the lungs. Acute respiratory distress syndrome or ARDS. This is a condition in which fluid build-up in the lungs prevents the lungs from filling with air and passing oxygen into the blood. Acute respiratory failure. This is a condition in which there is not enough oxygen passing from the lungs to the body or when carbon dioxide is not passing from the lungs out of the body. Sepsis or septic shock. This is a serious bodily reaction to an infection. Blood clotting problems. Secondary infections due to bacteria or fungus. Organ failure. This is when your body's organs stop working. The virus that causes COVID-19 is contagious. This means that it can spread from person to person through droplets from coughs and sneezes (respiratory secretions). What are the causes? This illness is caused by a virus. You may catch the virus by: Breathing in droplets from an infected person. Droplets can be spread by a person breathing, speaking, singing, coughing, or sneezing. Touching something, like a table or a doorknob, that was exposed to the virus (contaminated) and then touching your mouth, nose, or eyes. What increases the risk? Risk for infection You are more likely to be infected with this virus if you: Are within 6 feet (2 meters) of a person with COVID-19. Provide care for or live with a person who is infected with COVID-19. Spend time in crowded indoor spaces or live in shared housing. Risk for serious illness You are more  likely to become seriously ill from the virus if you: Are 55 years of age or older. The higher your age, the more you are at risk for serious illness. Live in a nursing home or long-term care facility. Have cancer. Have a long-term (chronic) disease such as: Chronic lung disease, including chronic obstructive pulmonary disease or asthma. A long-term disease that lowers your body's ability to fight infection (immunocompromised). Heart disease, including heart failure, a condition in which the arteries that lead to the heart become narrow or blocked (coronary artery disease), a disease which makes the heart muscle thick, weak, or stiff (cardiomyopathy). Diabetes. Chronic kidney disease. Sickle cell disease, a condition in which red blood cells have an abnormal "sickle" shape. Liver disease. Are obese. What are the signs or symptoms? Symptoms of this condition can range from mild to severe. Symptoms may appear any time from 2 to 14 days  after being exposed to the virus. They include: A fever or chills. A cough. Difficulty breathing. Headaches, body aches, or muscle aches. Runny or stuffy (congested) nose. A sore throat. New loss of taste or smell. Some people may also have stomach problems, such as nausea, vomiting, or diarrhea. Other people may not have any symptoms of COVID-19. How is this diagnosed? This condition may be diagnosed based on: Your signs and symptoms, especially if: You live in an area with a COVID-19 outbreak. You recently traveled to or from an area where the virus is common. You provide care for or live with a person who was diagnosed with COVID-19. You were exposed to a person who was diagnosed with COVID-19. A physical exam. Lab tests, which may include: Taking a sample of fluid from the back of your nose and throat (nasopharyngeal fluid), your nose, or your throat using a swab. A sample of mucus from your lungs (sputum). Blood tests. Imaging tests, which may  include, X-rays, CT scan, or ultrasound. How is this treated? At present, there is no medicine to treat COVID-19. Medicines that treat other diseases are being used on a trial basis to see if they are effective against COVID-19. Your health care provider will talk with you about ways to treat your symptoms. For most people, the infection is mild and can be managed at home with rest, fluids, and over-the-counter medicines. Treatment for a serious infection usually takes places in a hospital intensive care unit (ICU). It may include one or more of the following treatments. These treatments are given until your symptoms improve. Receiving fluids and medicines through an IV. Supplemental oxygen. Extra oxygen is given through a tube in the nose, a face mask, or a hood. Positioning you to lie on your stomach (prone position). This makes it easier for oxygen to get into the lungs. Continuous positive airway pressure (CPAP) or bi-level positive airway pressure (BPAP) machine. This treatment uses mild air pressure to keep the airways open. A tube that is connected to a motor delivers oxygen to the body. Ventilator. This treatment moves air into and out of the lungs by using a tube that is placed in your windpipe. Tracheostomy. This is a procedure to create a hole in the neck so that a breathing tube can be inserted. Extracorporeal membrane oxygenation (ECMO). This procedure gives the lungs a chance to recover by taking over the functions of the heart and lungs. It supplies oxygen to the body and removes carbon dioxide. Follow these instructions at home: Lifestyle If you are sick, stay home except to get medical care. Your health care provider will tell you how long to stay home. Call your health care provider before you go for medical care. Rest at home as told by your health care provider. Do not use any products that contain nicotine or tobacco, such as cigarettes, e-cigarettes, and chewing tobacco. If you  need help quitting, ask your health care provider. Return to your normal activities as told by your health care provider. Ask your health care provider what activities are safe for you. General instructions Take over-the-counter and prescription medicines only as told by your health care provider. Drink enough fluid to keep your urine pale yellow. Keep all follow-up visits as told by your health care provider. This is important. How is this prevented?  There is no vaccine to help prevent COVID-19 infection. However, there are steps you can take to protect yourself and others from this virus. To protect yourself:  Do not travel to areas where COVID-19 is a risk. The areas where COVID-19 is reported change often. To identify high-risk areas and travel restrictions, check the CDC travel website: FatFares.com.br If you live in, or must travel to, an area where COVID-19 is a risk, take precautions to avoid infection. Stay away from people who are sick. Wash your hands often with soap and water for 20 seconds. If soap and water are not available, use an alcohol-based hand sanitizer. Avoid touching your mouth, face, eyes, or nose. Avoid going out in public, follow guidance from your state and local health authorities. If you must go out in public, wear a cloth face covering or face mask. Make sure your mask covers your nose and mouth. Avoid crowded indoor spaces. Stay at least 6 feet (2 meters) away from others. Disinfect objects and surfaces that are frequently touched every day. This may include: Counters and tables. Doorknobs and light switches. Sinks and faucets. Electronics, such as phones, remote controls, keyboards, computers, and tablets. To protect others: If you have symptoms of COVID-19, take steps to prevent the virus from spreading to others. If you think you have a COVID-19 infection, contact your health care provider right away. Tell your health care team that you think  you may have a COVID-19 infection. Stay home. Leave your house only to seek medical care. Do not use public transport. Do not travel while you are sick. Wash your hands often with soap and water for 20 seconds. If soap and water are not available, use alcohol-based hand sanitizer. Stay away from other members of your household. Let healthy household members care for children and pets, if possible. If you have to care for children or pets, wash your hands often and wear a mask. If possible, stay in your own room, separate from others. Use a different bathroom. Make sure that all people in your household wash their hands well and often. Cough or sneeze into a tissue or your sleeve or elbow. Do not cough or sneeze into your hand or into the air. Wear a cloth face covering or face mask. Make sure your mask covers your nose and mouth. Where to find more information Centers for Disease Control and Prevention: PurpleGadgets.be World Health Organization: https://www.castaneda.info/ Contact a health care provider if: You live in or have traveled to an area where COVID-19 is a risk and you have symptoms of the infection. You have had contact with someone who has COVID-19 and you have symptoms of the infection. Get help right away if: You have trouble breathing. You have pain or pressure in your chest. You have confusion. You have bluish lips and fingernails. You have difficulty waking from sleep. You have symptoms that get worse. These symptoms may represent a serious problem that is an emergency. Do not wait to see if the symptoms will go away. Get medical help right away. Call your local emergency services (911 in the [place] Do not drive yourself to the hospital. Let the emergency medical personnel know if you think you have COVID-19. Summary COVID-19 is a respiratory infection that is caused by a virus. It is also known as coronavirus disease or novel  coronavirus. It can cause serious infections, such as pneumonia, acute respiratory distress syndrome, acute respiratory failure, or sepsis. The virus that causes COVID-19 is contagious. This means that it can spread from person to person through droplets from breathing, speaking, singing, coughing, or sneezing. You are more likely to develop a serious illness if you  are 35 years of age or older, have a weak immune system, live in a nursing home, or have chronic disease. There is no medicine to treat COVID-19. Your health care provider will talk with you about ways to treat your symptoms. Take steps to protect yourself and others from infection. Wash your hands often and disinfect objects and surfaces that are frequently touched every day. Stay away from people who are sick and wear a mask if you are sick. This information is not intended to replace advice given to you by your health care provider. Make sure you discuss any questions you have with your health care provider. Document Revised: 12/04/2018 Document Reviewed: 03/12/2018 Elsevier Patient Education  2020 [place] rest and stay hydrated. 6. If you have a medical appointment, call the healthcare provider ahead of time and tell them that you have or may have COVID-19. 7. For medical emergencies, call 911 and notify the dispatch personnel that you have or may have COVID-19. 8. Cover your cough and sneezes with a tissue or use the inside of your elbow. 9. Wash your hands often with soap and water for at least 20 seconds or clean your hands with an alcohol-based hand sanitizer that contains at least 60% alcohol. 10. As much as possible, stay in a specific room and away from other people in your home. Also, you should use a separate bathroom, if available. If you need to be around other people in or outside of the home, wear a mask. 11. Avoid sharing personal items with other people in your household, like dishes, towels, and  bedding. 12. Clean all surfaces that are touched often, like counters, tabletops, and doorknobs. Use household cleaning sprays or wipes according to the label instructions. [email or web address] 08/19/2018 This information is not intended to replace advice given to you by your health care provider. Make sure you discuss any questions you have with your health care provider. Document Revised: 01/21/2019 Document Reviewed: 01/21/2019 Elsevier Patient Education  [place].

## 2019-10-21 NOTE — Plan of Care (Signed)

## 2019-10-21 NOTE — Discharge Summary (Signed)
Physician Discharge Summary  [name] WYO:[id number] DOB: [birth year] DOA: 10/18/2019  PCP: Libby Maw, MD  Admit date: 10/18/2019 Discharge date: 10/21/2019  Admitted From: Home Disposition: Home  Recommendations for Outpatient Follow-up:  1. Follow ups as below. 2. Please obtain CBC/BMP/Mag at follow up 3. Please follow up on the following pending results: None  Home Health: None required Equipment/Devices: None required  Discharge Condition: Stable CODE STATUS: Full code   Follow-up Information    Libby Maw, MD. Schedule an appointment as soon as possible for a visit in 2 week(s).   Specialty: Family Medicine Contact information: [place] [address] [phone]                Hospital Course: 49 year old female with PMH of morbid obesity, HTN, HLD and GERD presenting with shortness of breath, myalgia, chills and fever for about 1 to 2 weeks.  Tested positive for COVID-19 at [facility] on 8/22 but discharged home.  Returning to ED on 8/29 with worsening nausea, vomiting, shortness of breath and cough. CT abdomen and pelvis without acute finding.  Oxygen sats down to 91% on RA.Patientadmitted butwas held overnight in the emergency department waiting for bed. She was much improved the next day and able to ambulate-d/ced home with steroid taper and remained on Remdesevir infusions to be completed at infusion center.  However, she became dyspneic with minimal exertion when she got home and her oxygen was in mid 80s.  She returned to ED on 8/30.  In ED, desaturated to 87% requiring 3 L.  RR 30.  She was admitted on Decadron and remdesivir.  She completed course of remdesivir.  Patient's symptoms improved.  Inflammatory markers normalized.   Ambulated on room air and maintained saturation greater than 94%.  Discharged home with return and isolation precautions.  See individual problem list below for more hospital  course.  Discharge Diagnoses:  Acute respiratory failure with hypoxia due to COVID-19 pneumonia: Symptomatic for over 10 days. Tested positive for 8/22.  Progressive dyspnea and hypoxia.  Bilateral infiltrates on imaging.  Inflammatory markers elevated on presentation but normalized.  Weaned off oxygen to room air.  Ambulated and maintained saturation above 94% on room air.  Recent Labs (last 2 labs)        Recent Labs    10/18/19 2157 10/20/19 0500 10/21/19 0421  DDIMER 0.31 <0.27 0.29  FERRITIN 612*  --   --   CRP 6.7* 1.1* 0.7    -Discharged home with return and isolation precautions.  Nausea and vomiting: Likely due to COVID-19 infection.  Resolved.  Essential HTN: Normotensive. -Continue home medications  Hyperlipidemia: -Continue lovastatin.  Class II obesity: BMI 38. -Encouraged lifestyle change to lose weight.    There is no height or weight on file to calculate BMI.            Discharge Exam: Vitals:   10/21/19 1329 10/21/19 1400  BP: 126/69   Pulse: 97   Resp: 20   Temp:    SpO2: 96% 96%    GENERAL: No apparent distress.  Nontoxic. HEENT: MMM.  Vision and hearing grossly intact.  NECK: Supple.  No apparent JVD.  RESP:  No IWOB.  Fair aeration bilaterally. CVS:  RRR. Heart sounds normal.  ABD/GI/GU: Bowel sounds present. Soft. Non tender.  MSK/EXT:  Moves extremities. No apparent deformity. No edema.  SKIN: no apparent skin lesion or wound NEURO: Awake, alert and oriented appropriately.  No apparent focal neuro  deficit. PSYCH: Calm. Normal affect.   Discharge Instructions  Discharge Instructions    Call MD for:  difficulty breathing, headache or visual disturbances   Complete by: As directed    Call MD for:  extreme fatigue   Complete by: As directed    Call MD for:  persistant dizziness or light-headedness   Complete by: As directed    Diet - low sodium heart healthy   Complete by: As directed    Discharge instructions   Complete by:  As directed    It has been a pleasure taking care of you!  You were hospitalized due to difficulty breathing as a result of COVID-19 infection.  Your symptoms improved with treatment to the point we think it is safe to let you go home and follow-up with your primary care doctor.   In regards to Covid infection, you are still potentially infectious for at least 21 days from 10/10/19. We recommend you isolate yourself and take the necessary precautions to prevent the virus from spreading.  Some of the steps to prevent the virus from spreading to others: Stay away from other and members of your household at least for the next 11-15 days. Let healthy household members care for children and pets, if possible. If you have to care for children or pets, wash your hands often and wear a mask. If possible, stay in your own room, separate from others. Use a different bathroom.Make sure that all people in your household wash their hands well and often. Leave your house only to seek medical care. Do not use public transport. Do not travel while you are sick. Wash your hands often with soap and water for 20 seconds. If soap and water are not available, use alcohol-based hand sanitizer. Cough or sneeze into a tissue or your sleeve or elbow. Do not cough or sneeze into your hand or into the air. Wear a cloth face covering or face mask.  Return precautions: Get help or return to the hospital right away if: You have trouble breathing. You have pain or pressure in your chest. You have confusion. You have bluish lips and fingernails. You have difficulty waking from sleep. You have symptoms that get worse. These symptoms may represent a serious problem that is an emergency. Do not wait to see if the symptoms will go away. Get medical help right away. Call your local emergency services (911 in the [place] Do not drive yourself to the hospital. Let the emergency medical personnel know if you think you have  COVID-19.  To protect yourself in the future:  Do not travel to areas where COVID-19 is a risk. The areas where COVID-19 is reported change often. To identify high-risk areas and travel restrictions, check the CDC travel website: FatFares.com.br If you live in, or must travel to, an area where COVID-19 is a risk, take precautions to avoid infection. Stay away from people who are sick. Wash your hands often with soap and water for 20 seconds. If soap and water are not available, use an alcohol-based hand sanitizer. Avoid touching your mouth, face, eyes, or nose. Avoid going out in public, follow guidance from your state and local health authorities. If you must go out in public, wear a cloth face covering or face mask. Disinfect objects and surfaces that are frequently touched every day. This may include: Counters and tables. Doorknobs and light switches. Sinks and faucets. Electronics, such as phones, remote controls, keyboards, computers, and tablets.    Where to  find more information Centers for Disease Control and Prevention: PurpleGadgets.be World Health Organization: https://www.castaneda.info/    Take care,   Increase activity slowly   Complete by: As directed      Allergies as of 10/21/2019      Reactions   Keflex [cephalexin] Anaphylaxis   Penicillins Anaphylaxis   Has patient had a PCN reaction causing immediate rash, facial/tongue/throat swelling, SOB or lightheadedness with hypotension: Yes Has patient had a PCN reaction causing severe rash involving mucus membranes or skin necrosis: No Has patient had a PCN reaction that required hospitalization: No Has patient had a PCN reaction occurring within the last 10 years: No If all of the above answers are "NO", then may proceed with Cephalosporin use.      Medication List    STOP taking these medications   predniSONE 10 MG tablet Commonly known as: DELTASONE     TAKE  these medications   acetaminophen 500 MG tablet Commonly known as: TYLENOL Take 500 mg by mouth every 6 (six) hours as needed for fever.   amLODipine 5 MG tablet Commonly known as: NORVASC Take 1 tablet (5 mg total) by mouth daily.   atorvastatin 20 MG tablet Commonly known as: LIPITOR Take 1 tablet (20 mg total) by mouth daily.   HAIR/SKIN/NAILS/BIOTIN PO Take 1 tablet by mouth daily.   lisinopril 10 MG tablet Commonly known as: ZESTRIL Take 1 tablet (10 mg total) by mouth daily.   nitroGLYCERIN 0.4 MG SL tablet Commonly known as: Nitrostat Place 1 tablet (0.4 mg total) under the tongue every 5 (five) minutes as needed for up to 25 days for chest pain.   ondansetron 4 MG disintegrating tablet Commonly known as: Zofran ODT Take 1 tablet (4 mg total) by mouth every 8 (eight) hours as needed for nausea or vomiting.   promethazine 25 MG tablet Commonly known as: PHENERGAN Take 1 tablet (25 mg total) by mouth every 8 (eight) hours as needed for nausea or vomiting.   Remifemin 20 MG Tabs Generic drug: Black Cohosh Take 40 mg by mouth daily.       Consultations:  None  Procedures/Studies:   CT ABDOMEN PELVIS W CONTRAST  Result Date: 10/10/2019 CLINICAL DATA:  Left lower quadrant pain. EXAM: CT ABDOMEN AND PELVIS WITH CONTRAST TECHNIQUE: Multidetector CT imaging of the abdomen and pelvis was performed using the standard protocol following bolus administration of intravenous contrast. CONTRAST:  174mL OMNIPAQUE IOHEXOL 300 MG/ML  SOLN COMPARISON:  September 01, 2018 FINDINGS: Lower chest: Very mild posterior bibasilar atelectasis is seen. Hepatobiliary: No focal liver abnormality is seen. No gallstones, gallbladder wall thickening, or biliary dilatation. Pancreas: Unremarkable. No pancreatic ductal dilatation or surrounding inflammatory changes. Spleen: Normal in size without focal abnormality. Adrenals/Urinary Tract: Adrenal glands are unremarkable. Kidneys are normal, without  renal calculi, focal lesion, or hydronephrosis. Bladder is unremarkable. Stomach/Bowel: Stomach is within normal limits. Appendix appears normal. Surgically anastomosed bowel is seen within the posterior aspect of the right lower quadrant and within the region of the mid sigmoid colon. No evidence of bowel dilatation. Noninflamed diverticula are seen within the sigmoid colon. Vascular/Lymphatic: There is mild calcification of the abdominal aorta and bilateral common iliac arteries, without evidence of aneurysmal dilatation. No enlarged abdominal or pelvic lymph nodes. Reproductive: Status post hysterectomy. No adnexal masses. Other: No abdominal wall hernia or abnormality. No abdominopelvic ascites. Musculoskeletal: No acute or significant osseous findings. IMPRESSION: 1. Sigmoid diverticulosis without evidence of acute diverticulitis. 2. Aortic atherosclerosis. Aortic Atherosclerosis (ICD10-I70.0). Electronically  Signed   By: Virgina Norfolk M.D.   On: 10/10/2019 16:06   DG Chest Port 1 View  Result Date: 10/17/2019 CLINICAL DATA:  COVID positive EXAM: PORTABLE CHEST 1 VIEW COMPARISON:  04/21/2018 FINDINGS: Possible subtle peripheral infiltrate in the left mid and upper lung. No consolidation or effusion. Normal heart size. No pneumothorax. IMPRESSION: Possible subtle peripheral infiltrate in the left mid to upper lung. Electronically Signed   By: Donavan Foil M.D.   On: 10/17/2019 01:54        The results of significant diagnostics from this hospitalization (including imaging, microbiology, ancillary and laboratory) are listed below for reference.     Microbiology: Recent Results (from the past 240 hour(s))  Blood Culture (routine x 2)     Status: None (Preliminary result)   Collection Time: 10/17/19  1:59 PM   Specimen: BLOOD LEFT HAND  Result Value Ref Range Status   Specimen Description   Final    BLOOD LEFT HAND Performed at [facility], [place] [address], [place], [place]  [address]    Special Requests   Final    BOTTLES DRAWN AEROBIC AND ANAEROBIC Blood Culture results may not be optimal due to an inadequate volume of blood received in culture bottles Performed at [facility] [address] [place], [place] [address]    Culture   Final    NO GROWTH 4 DAYS Performed at [facility], [place] [address], [place], [place] [address]    Report Status PENDING  Incomplete  Blood Culture (routine x 2)     Status: None (Preliminary result)   Collection Time: 10/17/19  1:59 PM   Specimen: BLOOD  Result Value Ref Range Status   Specimen Description   Final    BLOOD RIGHT ANTECUBITAL Performed at [facility] [address] [place], [place] [address]    Special Requests   Final    BOTTLES DRAWN AEROBIC AND ANAEROBIC Blood Culture results may not be optimal due to an inadequate volume of blood received in culture bottles Performed at [facility] [address] [place], [place] [address]    Culture   Final    NO GROWTH 4 DAYS Performed at [facility], [place] [address], [place],  [address]    Report Status PENDING  Incomplete     Labs: BNP (last 3 results) No results for input(s): BNP in the last 8760 hours. Basic Metabolic Panel: Recent Labs  Lab 10/17/19 0100 10/18/19 0500 10/18/19 2157 10/20/19 0500 10/21/19 0421  NA 139 138 139 138 140  K 3.6 3.9 3.8 3.8 3.9  CL 100 102 102 102 104  CO2 25 26 25 25 26   GLUCOSE 131* 150* 144* 135* 136*  BUN 8 9 13 13 13   CREATININE 0.88 0.69 0.73 0.63 0.65  CALCIUM 8.9 8.8* 8.9 8.9 9.0  MG  --   --   --  2.2 2.3  PHOS  --   --   --  3.4 3.8   Liver Function Tests: Recent Labs  Lab 10/17/19 0100 10/18/19 0500 10/18/19 2157 10/20/19 0500 10/21/19 0421  AST 33 38 37 29 27  ALT 22 28 29 25 26   ALKPHOS 50 42 45 38 41  BILITOT 0.8 0.6 0.8 0.8 0.7  PROT 7.4 7.5 8.0 7.0 6.8  ALBUMIN 4.2 3.7 4.0 3.7 3.4*   No results for input(s):  LIPASE, AMYLASE in the last 168 hours. No results  for input(s): AMMONIA in the last 168 hours. CBC: Recent Labs  Lab 10/17/19 0100 10/18/19 0500 10/18/19 2157 10/20/19 0500 10/21/19 0421  WBC 6.6 5.5 6.8 13.0* 13.0*  NEUTROABS 4.5 3.5 4.3 9.2* 9.0*  HGB 13.5 12.6 13.0 12.4 12.7  HCT 42.0 38.4 39.9 38.2 39.0  MCV 90.9 90.6 89.9 90.1 89.4  PLT 258 301 360 442* 444*   Cardiac Enzymes: No results for input(s): CKTOTAL, CKMB, CKMBINDEX, TROPONINI in the last 168 hours. BNP: Invalid input(s): POCBNP CBG: No results for input(s): GLUCAP in the last 168 hours. D-Dimer Recent Labs    10/20/19 0500 10/21/19 0421  DDIMER <0.27 0.29   Hgb A1c No results for input(s): HGBA1C in the last 72 hours. Lipid Profile No results for input(s): CHOL, HDL, LDLCALC, TRIG, CHOLHDL, LDLDIRECT in the last 72 hours. Thyroid function studies No results for input(s): TSH, T4TOTAL, T3FREE, THYROIDAB in the last 72 hours.  Invalid input(s): FREET3 Anemia work up Recent Labs    10/18/19 2157  FERRITIN 612*   Urinalysis    Component Value Date/Time   COLORURINE YELLOW 10/10/2019 [place] 10/10/2019 0905   LABSPEC >1.030 (H) 10/10/2019 0905   PHURINE 5.5 10/10/2019 0905   GLUCOSEU NEGATIVE 10/10/2019 0905   GLUCOSEU NEGATIVE 10/04/2019 0830   HGBUR NEGATIVE 10/10/2019 0905   BILIRUBINUR NEGATIVE 10/10/2019 0905   BILIRUBINUR negative 05/29/2017 1742   KETONESUR NEGATIVE 10/10/2019 0905   PROTEINUR NEGATIVE 10/10/2019 0905   UROBILINOGEN 0.2 10/04/2019 0830   NITRITE NEGATIVE 10/10/2019 0905   LEUKOCYTESUR NEGATIVE 10/10/2019 0905   Sepsis Labs Invalid input(s): PROCALCITONIN,  WBC,  LACTICIDVEN   Time coordinating discharge: 35 minutes  SIGNED:  Mercy Riding, MD  [facility] 10/21/2019, 2:46 PM  If 7PM-7AM, please contact night-coverage [email or web address]

## 2019-10-21 NOTE — Progress Notes (Signed)
Pts oxygen decreased from 3L to 1L, obtained sat at 96%

## 2019-10-21 NOTE — Progress Notes (Signed)
SATURATION QUALIFICATIONS: (This note is used to comply with regulatory documentation for home oxygen)  Patient Saturations on Room Air at Rest =94%  Patient Saturations on Room Air while Ambulating = between 94%-95%  Patient Saturations on 3 Liters of oxygen while Ambulating = 96%

## 2019-10-21 NOTE — Progress Notes (Signed)
Discharge instructions given to pt with stated understanding. Pt awaiting a ride.

## 2019-10-22 ENCOUNTER — Emergency Department (HOSPITAL_COMMUNITY): Payer: 59

## 2019-10-22 ENCOUNTER — Other Ambulatory Visit: Payer: Self-pay

## 2019-10-22 ENCOUNTER — Emergency Department (HOSPITAL_COMMUNITY)
Admission: EM | Admit: 2019-10-22 | Discharge: 2019-10-22 | Disposition: A | Discharge status: Home / Self Care | Payer: 59 | Attending: Emergency Medicine | Admitting: Emergency Medicine

## 2019-10-22 ENCOUNTER — Encounter (HOSPITAL_COMMUNITY): Payer: Self-pay

## 2019-10-22 DIAGNOSIS — U071 COVID-19: Secondary | ICD-10-CM | POA: Insufficient documentation

## 2019-10-22 DIAGNOSIS — R0602 Shortness of breath: Secondary | ICD-10-CM

## 2019-10-22 DIAGNOSIS — Z87891 Personal history of nicotine dependence: Secondary | ICD-10-CM | POA: Diagnosis not present

## 2019-10-22 DIAGNOSIS — Z79899 Other long term (current) drug therapy: Secondary | ICD-10-CM | POA: Insufficient documentation

## 2019-10-22 DIAGNOSIS — I10 Essential (primary) hypertension: Secondary | ICD-10-CM | POA: Diagnosis not present

## 2019-10-22 LAB — BASIC METABOLIC PANEL
Anion gap: 11 (ref 5–15)
BUN: 13 mg/dL (ref 6–20)
CO2: 25 mmol/L (ref 22–32)
Calcium: 8.4 mg/dL — ABNORMAL LOW (ref 8.9–10.3)
Chloride: 102 mmol/L (ref 98–111)
Creatinine, Ser: 0.69 mg/dL (ref 0.44–1.00)
GFR calc Af Amer: >60 mL/min (ref >60)
GFR calc non Af Amer: >60 mL/min (ref >60)
Glucose, Bld: 100 mg/dL — ABNORMAL HIGH (ref 70–99)
Potassium: 3.6 mmol/L (ref 3.5–5.1)
Sodium: 138 mmol/L (ref 135–145)

## 2019-10-22 LAB — CBC WITH DIFFERENTIAL/PLATELET
Abs Immature Granulocytes: 0.5 10*3/uL — ABNORMAL HIGH (ref 0.00–0.07)
Basophils Absolute: 0.1 10*3/uL (ref 0.0–0.1)
Basophils Relative: 0 %
Eosinophils Absolute: 0 10*3/uL (ref 0.0–0.5)
Eosinophils Relative: 0 %
HCT: 33.4 % — ABNORMAL LOW (ref 36.0–46.0)
Hemoglobin: 10.8 g/dL — ABNORMAL LOW (ref 12.0–15.0)
Immature Granulocytes: 4 %
Lymphocytes Relative: 23 %
Lymphs Abs: 2.7 10*3/uL (ref 0.7–4.0)
MCH: 29.3 pg (ref 26.0–34.0)
MCHC: 32.3 g/dL (ref 30.0–36.0)
MCV: 90.8 fL (ref 80.0–100.0)
Monocytes Absolute: 2.2 10*3/uL — ABNORMAL HIGH (ref 0.1–1.0)
Monocytes Relative: 19 %
Neutro Abs: 6.3 10*3/uL (ref 1.7–7.7)
Neutrophils Relative %: 54 %
Platelets: 340 10*3/uL (ref 150–400)
RBC: 3.68 MIL/uL — ABNORMAL LOW (ref 3.87–5.11)
RDW: 12.9 % (ref 11.5–15.5)
WBC: 11.8 10*3/uL — ABNORMAL HIGH (ref 4.0–10.5)
nRBC: 0.3 % — ABNORMAL HIGH (ref 0.0–0.2)

## 2019-10-22 LAB — CULTURE, BLOOD (ROUTINE X 2)
Culture: NO GROWTH
Culture: NO GROWTH

## 2019-10-22 LAB — TROPONIN I (HIGH SENSITIVITY)
Troponin I (High Sensitivity): 3 ng/L (ref <18)
Troponin I (High Sensitivity): <2 ng/L (ref <18)

## 2019-10-22 LAB — D-DIMER, QUANTITATIVE: D-Dimer, Quant: <0.27 ug/mL-FEU (ref 0.00–0.50)

## 2019-10-22 MED ORDER — ALBUTEROL SULFATE HFA 108 (90 BASE) MCG/ACT IN AERS: 2.0000 | INHALATION_SPRAY | RESPIRATORY_TRACT | 1 refills | Status: DC | PRN

## 2019-10-22 NOTE — ED Notes (Signed)
Pt educated on using incentive spirometer and flutter valve.

## 2019-10-22 NOTE — Progress Notes (Signed)
TOC CM spoke to pt and states her husband will be at home to accept oxygen. Contacted [facility] with new referral. He will bring portable with him to pt's room. The oxygen has to be in home prior to pt being dc home. ED provider and RN updated. [place], [place] ED TOC CM [phone]

## 2019-10-22 NOTE — ED Triage Notes (Signed)
Pt BIB EMS from home. Pt COVID+ 8/22. Pt seen and admitted 4 days ago. Pt c/o SOB. EMS reports pt 94% on 2L [place]. Pt took tylenol this AM. Resp 24. CO2 32.

## 2019-10-22 NOTE — ED Notes (Signed)
An After Visit Summary was printed and given to the patient. Discharge instructions given and no further questions at this time.  Pt leaving with husband and home O2. Pt assisted to home vehicle with assistance from RN and [place], NT.

## 2019-10-22 NOTE — ED Notes (Signed)
ED Provider bedside 

## 2019-10-22 NOTE — Discharge Instructions (Signed)
°  Your symptoms are consistent with COVID-19 infection, which is caused by a virus.  Viruses do not require or respond to antibiotics. Treatment is symptomatic care and it is important to note that these symptoms may last for 7-14 days.   Hand washing: Wash your hands throughout the day, but especially before and after touching the face, using the restroom, sneezing, coughing, or touching surfaces that have been coughed or sneezed upon. Hydration: Symptoms of most illnesses will be intensified and complicated by dehydration. Dehydration can also extend the duration of symptoms. Drink plenty of fluids and get plenty of rest. You should be drinking at least half a liter of water an hour to stay hydrated. Electrolyte drinks (ex. Gatorade, Powerade, Pedialyte) are also encouraged. You should be drinking enough fluids to make your urine light yellow, almost clear. If this is not the case, you are not drinking enough water. Please note that some of the treatments indicated below will not be effective if you are not adequately hydrated. Pain or fever: Ibuprofen, Naproxen, or acetaminophen (generic for Tylenol) for pain or fever.  Antiinflammatory medications: Take 600 mg of ibuprofen every 6 hours or 440 mg (over the counter dose) to 500 mg (prescription dose) of naproxen every 12 hours for the next 3 days. After this time, these medications may be used as needed for pain. Take these medications with food to avoid upset stomach. Choose only one of these medications, do not take them together. Acetaminophen (generic for Tylenol): Should you continue to have additional pain while taking the ibuprofen or naproxen, you may add in acetaminophen as needed. Your daily total maximum amount of acetaminophen from all sources should be limited to 4000mg /day for persons without liver problems, or 2000mg /day for those with liver problems. Cough: Teas, warm liquids, broths, and honey can help with cough. Albuterol: May use the  albuterol as needed for instances of shortness of breath. Zyrtec or Claritin: May add these medication daily to control underlying symptoms of congestion, sneezing, and other signs of allergies.  These medications are available over-the-counter. Generics: Cetirizine (generic for Zyrtec) and loratadine (generic for Claritin). Fluticasone: Use fluticasone (generic for Flonase), as directed, for nasal and sinus congestion.  This medication is available over-the-counter. Congestion: Plain guaifenesin (generic for plain Mucinex) may help relieve congestion. Saline sinus rinses and saline nasal sprays may also help relieve congestion. Sore throat: Warm liquids or Chloraseptic spray may help soothe a sore throat. Gargle twice a day with a salt water solution made from a half teaspoon of salt in a cup of warm water.  Follow up: Follow up with a primary care provider within the next two weeks should symptoms fail to resolve. We also recommend following up with pulmonology. Return: Return to the ED for significantly worsening symptoms, shortness of breath despite rest and wearing of the prescribed oxygen, persistent vomiting, large amounts of blood in stool, or any other major concerns. Supplemental oxygen: Additionally, you have been prescribed supplemental oxygen at 2 L/min.  If you have shortness of breath, especially at rest, while wearing this oxygen, you may increase the rate of oxygen delivered.  If you must do this, it is important to return to the emergency department for reevaluation.  For prescription assistance, may try using prescription discount sites or apps, such as goodrx.com

## 2019-10-22 NOTE — ED Provider Notes (Signed)
[facility] DEPT Provider Note   CSN: [id number] Arrival date & time: 10/22/19  1230     History Chief Complaint  Patient presents with  . Covid Positive  . Shortness of Breath    [name] is a 49 y.o. female.  HPI      [name] is a 49 y.o. female, with a history of hyperlipidemia, HTN, COVID-19, obesity, GERD, presenting to the ED with shortness of breath this morning. She endorses shortness of breath with exertion.  Then, around 11:30 AM this morning she had a "twinge" of left-sided chest pain that was sharp, moderate in intensity, nonradiating, lasting less than a minute.  This pain resolved and did not recur.  This prompted her to check her SPO2 and noted it was 89%. Patient states she was discharged from the hospital yesterday after admission due to COVID-19.  She had symptoms of shortness of breath with exertion after discharge, but she became worried today when she had the episode of chest pain and then noted her SPO2 was low.   Denies persistent fever, N/V/D, orthopnea, lower extremity edema/pain, continued chest pain, dizziness, syncope, or any other complaints.   Past Medical History:  Diagnosis Date  . Abnormal Pap smear 2000   [facility]  . Allergy   . Arthritis    hands  . Back pain   . Blood transfusion without reported diagnosis 2005  . Breast fibroadenoma   . Diverticulitis of colon with perforation 05/20/2014  . GERD (gastroesophageal reflux disease)   . H/O metrorrhagia 12/2006  . H/O myomectomy 07-2006   robotic  . H/O sinusitis   . Headache(784.0)   . Heart murmur   . History of conization of cervix 2000  . History of ectopic pregnancy 2005   r salpyngectomy  . History of hysterectomy, supracervical 08-2008   robotic  . History of syphilis   . History of syphilis   . Hx of adenomatous polyp of colon 12/31/2017  . Hx of herpes simplex type 2 infection   . Hyperlipidemia   . Hypertension   . Ileostomy  present ([place]) 10/26/2014  . Intra-abdominal abscess ([place]) 07/21/2014  . PMS (premenstrual syndrome)     Patient Active Problem List   Diagnosis Date Noted  . COVID-19 virus infection 10/19/2019  . Mixed hyperlipidemia 10/19/2019  . Class 2 severe obesity due to excess calories with serious comorbidity and body mass index (BMI) of 37.0 to 37.9 in adult ([place]) 10/19/2019  . Acute respiratory failure with hypoxia ([place]) 10/19/2019  . COVID-19 10/12/2019  . Left leg pain 10/04/2019  . Elevated glucose 10/08/2018  . Elevated LDL cholesterol level 10/08/2018  . Healthcare maintenance 10/08/2018  . Need for immunization against influenza 10/08/2018  . Obesity (BMI 30-39.9) 03/29/2018  . Acute chest pain 03/27/2018  . Nonspecific abnormal electrocardiogram (ECG) (EKG) 03/27/2018  . Atypical chest pain 03/27/2018  . Hand pain, right 03/25/2018  . Dyslipidemia 08/15/2017  . History of hysterectomy, supracervical 02/14/2017  . Trigger ring finger of right hand 08/07/2015  . Primary osteoarthritis of first carpometacarpal joint of right hand 08/07/2015  . Diverticulitis of sigmoid colon s/p colectomy 06/30/2014 07/26/2014  . Tachycardia 02/04/2014  . Hx of adenomatous polyp of colon 12/23/2013  . Essential hypertension 11/02/2013  . Former smoker 11/26/2008    Past Surgical History:  Procedure Laterality Date  . ABDOMINAL HYSTERECTOMY    . BREAST LUMPECTOMY  10/11   rt-neg  . BREAST SURGERY  2004  fibroademoma  . CERVICAL CONE BIOPSY  2000  . COLON RESECTION N/A 07/06/2014   Procedure: LAPAROSCOPIC LOOP ILEOSTOMY WITH PLACEMENT OF PELVIC DRAIN;  Surgeon: Donnie Mesa, MD;  Location: [facility];  Service: General;  Laterality: N/A;  . COLON SURGERY     diverticulitis   . COLONOSCOPY    . DILATION AND CURETTAGE OF UTERUS  2009  . DIVERTING ILEOSTOMY Right 06/2014  . ECTOPIC PREGNANCY SURGERY  2005   R salpyngectomy  . ILEOSTOMY N/A 10/26/2014   Procedure: LOOP ILEOSTOMY REVERSAL;  Surgeon:  Donnie Mesa, MD;  Location: [facility];  Service: General;  Laterality: N/A;  . LAPAROSCOPIC SIGMOID COLECTOMY N/A 06/30/2014   Procedure: LAPAROSCOPIC ASSISTED SIGMOID COLECTOMY;  Surgeon: Donnie Mesa, MD;  Location: [facility];  Service: General;  Laterality: N/A;  . MYOMECTOMY  2008   robotic  . POLYPECTOMY  2009  . ROBOTIC ASSISTED LAP VAGINAL HYSTERECTOMY  2010   supracervical  . SALPINGECTOMY  2005  . TRIGGER FINGER RELEASE Right 09/28/2012   Procedure: RELEASE TRIGGER FINGER/A-1 PULLEY RIGHT THUMB;  Surgeon: Jolyn Nap, MD;  Location: [facility];  Service: Orthopedics;  Laterality: Right;  . ULNAR NERVE TRANSPOSITION Right 12/01/2018   Procedure: ULNAR NERVE DECOMPRESSION;  Surgeon: Daryll Brod, MD;  Location: [facility];  Service: Orthopedics;  Laterality: Right;  AXILLARY     OB History    Gravida  4   Para  2   Term  2   Preterm      AB  2   Living  2     SAB  0   TAB  1   Ectopic  1   Multiple  0   Live Births              Family History  Problem Relation Age of Onset  . Hypertension Paternal Grandmother   . Diabetes Paternal Grandmother   . Hypertension Maternal Grandmother   . Sarcoidosis Mother   . Hypertension Mother   . Hyperlipidemia Mother   . Thyroid disease Maternal Aunt   . Mental illness Cousin   . Fibroids Sister   . Colon cancer Neg Hx   . Colon polyps Neg Hx   . Esophageal cancer Neg Hx   . Rectal cancer Neg Hx   . Stomach cancer Neg Hx     Social History   Tobacco Use  . Smoking status: Former Smoker    Packs/day: 1.00    Years: 20.00    Pack years: 20.00    Types: Cigarettes    Quit date: 06/13/2011    Years since quitting: 8.3  . Smokeless tobacco: Never Used  Vaping Use  . Vaping Use: Never used  Substance Use Topics  . Alcohol use: Not Currently  . Drug use: No    Home Medications Prior to Admission medications   Medication Sig Start Date End Date Taking? Authorizing Provider   acetaminophen (TYLENOL) 500 MG tablet Take 500 mg by mouth every 6 (six) hours as needed for fever.   Yes [provider]  amLODipine (NORVASC) 5 MG tablet Take 1 tablet (5 mg total) by mouth daily. 09/07/19  Yes Libby Maw, MD  atorvastatin (LIPITOR) 20 MG tablet Take 1 tablet (20 mg total) by mouth daily. 09/07/19  Yes Libby Maw, MD  Black Cohosh (REMIFEMIN) 20 MG TABS Take 40 mg by mouth daily.   Yes [provider]  lisinopril (ZESTRIL) 10 MG tablet Take 1  tablet (10 mg total) by mouth daily. 09/07/19  Yes Libby Maw, MD  Multiple Vitamins-Minerals (HAIR/SKIN/NAILS/BIOTIN PO) Take 1 tablet by mouth daily.   Yes [provider]  ondansetron (ZOFRAN ODT) 4 MG disintegrating tablet Take 1 tablet (4 mg total) by mouth every 8 (eight) hours as needed for nausea or vomiting. 10/18/19  Yes Little Ishikawa, MD  promethazine (PHENERGAN) 25 MG tablet Take 1 tablet (25 mg total) by mouth every 8 (eight) hours as needed for nausea or vomiting. 10/14/19  Yes Libby Maw, MD  albuterol (VENTOLIN HFA) 108 (90 Base) MCG/ACT inhaler Inhale 2 puffs into the lungs every 4 (four) hours as needed for wheezing or shortness of breath. 10/22/19   Treon Kehl C, PA-C  nitroGLYCERIN (NITROSTAT) 0.4 MG SL tablet Place 1 tablet (0.4 mg total) under the tongue every 5 (five) minutes as needed for up to 25 days for chest pain. 09/06/19 10/01/19  Adrian Prows, MD    Allergies    Keflex [cephalexin] and Penicillins  Review of Systems   Review of Systems  Constitutional: Negative for chills, diaphoresis and fever.  Respiratory: Positive for shortness of breath.   Cardiovascular: Positive for chest pain. Negative for leg swelling.  Gastrointestinal: Negative for abdominal pain, diarrhea, nausea and vomiting.  Musculoskeletal: Negative for back pain.  Neurological: Negative for dizziness, syncope and weakness.  All other systems reviewed and are  negative.   Physical Exam Updated Vital Signs BP 107/69 (BP Location: Right Arm)   Pulse 97   Temp 98.5 F (36.9 C) (Oral)   Resp 12   Ht 5\' 4"  (1.626 m)   Wt 100.2 kg   SpO2 96%   BMI 37.93 kg/m   Physical Exam Vitals and nursing note reviewed.  Constitutional:      General: She is not in acute distress.    Appearance: She is well-developed. She is not diaphoretic.  HENT:     Head: Normocephalic and atraumatic.     Mouth/Throat:     Mouth: Mucous membranes are moist.     Pharynx: Oropharynx is clear.  Eyes:     Conjunctiva/sclera: Conjunctivae normal.  Cardiovascular:     Rate and Rhythm: Normal rate and regular rhythm.     Pulses: Normal pulses.          Radial pulses are 2+ on the right side and 2+ on the left side.       Posterior tibial pulses are 2+ on the right side and 2+ on the left side.     Heart sounds: Normal heart sounds.     Comments: Tactile temperature in the extremities appropriate and equal bilaterally. Pulmonary:     Effort: Pulmonary effort is normal. No respiratory distress.     Breath sounds: Normal breath sounds.     Comments: SPO2 89 to 91% on room air at times while at rest, mostly after movement or some exertion. Abdominal:     Palpations: Abdomen is soft.     Tenderness: There is no abdominal tenderness. There is no guarding.  Musculoskeletal:     Cervical back: Neck supple.     Right lower leg: No edema.     Left lower leg: No edema.  Lymphadenopathy:     Cervical: No cervical adenopathy.  Skin:    General: Skin is warm and dry.  Neurological:     Mental Status: She is alert.  Psychiatric:        Mood and Affect: Mood and affect  normal.        Speech: Speech normal.        Behavior: Behavior normal.     ED Results / Procedures / Treatments   Labs (all labs ordered are listed, but only abnormal results are displayed) Labs Reviewed  BASIC METABOLIC PANEL - Abnormal; Notable for the following components:      Result Value    Glucose, Bld 100 (*)    Calcium 8.4 (*)    All other components within normal limits  CBC WITH DIFFERENTIAL/PLATELET - Abnormal; Notable for the following components:   WBC 11.8 (*)    RBC 3.68 (*)    Hemoglobin 10.8 (*)    HCT 33.4 (*)    nRBC 0.3 (*)    Monocytes Absolute 2.2 (*)    Abs Immature Granulocytes 0.50 (*)    All other components within normal limits  D-DIMER, QUANTITATIVE (NOT AT [facility])  TROPONIN I (HIGH SENSITIVITY)  TROPONIN I (HIGH SENSITIVITY)   WBC  Date Value Ref Range Status  10/22/2019 11.8 (H) 4.0 - 10.5 K/uL Final  10/21/2019 13.0 (H) 4.0 - 10.5 K/uL Final  10/20/2019 13.0 (H) 4.0 - 10.5 K/uL Final  10/18/2019 6.8 4.0 - 10.5 K/uL Final    EKG EKG Interpretation  Date/Time:  Friday October 22 2019 13:37:30 EDT Ventricular Rate:  90 PR Interval:    QRS Duration: 81 QT Interval:  363 QTC Calculation: 445 R Axis:   76 Text Interpretation: Sinus rhythm Probable left atrial enlargement RSR' in V1 or V2, probably normal variant Confirmed by Madalyn Rob [phone]) on 10/22/2019 3:32:41 PM   Radiology DG Chest Portable 1 View  Result Date: 10/22/2019 CLINICAL DATA:  Shortness of breath, COVID positive EXAM: PORTABLE CHEST 1 VIEW COMPARISON:  10/17/2019 FINDINGS: Persistent and increased bilateral interstitial opacities. No pleural effusion. Stable cardiomediastinal contours. IMPRESSION: Persistent and increased bilateral interstitial opacities likely reflecting COVID-19 pneumonia. Electronically Signed   By: Macy Mis M.D.   On: 10/22/2019 14:04    Procedures Procedures (including critical care time)  Medications Ordered in ED Medications - No data to display  ED Course  I have reviewed the triage vital signs and the nursing notes.  Pertinent labs & imaging results that were available during my care of the patient were reviewed by me and considered in my medical decision making (see chart for details).  Clinical Course as of Oct 21 1899  Fri  Oct 22, 2019  1539 Spoke with Dr. Neysa Bonito.  He states he thinks sending the patient home on supplemental O2 is reasonable. Albuterol inhaler, incentive spirometer, flutter valve if cough is productive.   [SJ]  4193 Advice from hospitalist as well as proposed discharge plan discussed with the patient.  Questions were answered.  Patient states she is in agreement and comfortable with the plan.   [SJ]    Clinical Course User Index [SJ] Bibi Economos, Helane Gunther, PA-C   MDM Rules/Calculators/A&P                          Patient presents with shortness of breath, especially with exertion. Patient is nontoxic appearing, afebrile, not tachycardic, not tachypneic, not hypotensive.  Low suspicion for sepsis.  I have reviewed the patient's chart to obtain more information.  Patient was admitted August 30 for hypoxia and persistent GI symptoms in the setting of COVID-19.  These provider notes were reviewed.  Symptoms reportedly resolved during admission.  She was receiving remdesivir and steroid  therapy.  I reviewed and interpreted the patient's labs and radiological studies. She does have signs of Covid pneumonia on her chest x-ray.  This would be logical given her present symptoms and that her previous checks x-ray was performed around day 7 of her illness.   We had a long discussion regarding her goals for today's visit.  She has a strong preference towards managing her symptoms at home, if possible. She does have some intermittent hypoxia, especially with ambulation.  I had a shared decision-making discussion with the patient regarding how to proceed with her care. We decided to try starting the patient on home O2 with a plan to send her home and follow-up with pulmonology. We were able to secure oxygen at her home and her husband brought the newly acquired portable oxygen tank with him to pick up the patient. She did have some change in her hemoglobin.  We will have this retested through her PCP. The patient  was given instructions for home care as well as strict return precautions. Patient voices understanding of these instructions, accepts the plan, and is comfortable with discharge.  Findings and plan of care discussed with Madalyn Rob, MD. Dr. Roslynn Amble personally evaluated and examined this patient.  Vitals:   10/22/19 1730 10/22/19 1800 10/22/19 1830 10/22/19 1900  BP: 135/74 117/65 128/76 130/74  Pulse: 96 99 97 93  Resp: 18 17 10 18   Temp:      TempSrc:      SpO2: 98% 95% 94% 97%  Weight:      Height:         SATURATION QUALIFICATIONS: (This note is used to comply with regulatory documentation for home oxygen)  Patient Saturations on Room Air at Rest = 89%  Patient Saturations on Room Air while Ambulating = 86%  Patient Saturations on 2 Liters of oxygen while Ambulating = 95%  Please briefly explain why patient needs home oxygen:patient has low oxygen saturation without 2L nasal cannula. Pt COVID+.   Vitals:   10/22/19 1300 10/22/19 1301 10/22/19 1338 10/22/19 1415  BP: 107/69 107/69 107/61 (!) 115/58  Pulse: 99 97 88 92  Resp: (!) 22 12 18 19   Temp:  98.5 F (36.9 C)    TempSrc:  Oral    SpO2: 95% 96% 96% 96%  Weight:  100.2 kg    Height:  5\' 4"  (1.626 m)        Final Clinical Impression(s) / ED Diagnoses Final diagnoses:  Shortness of breath  COVID-19    Rx / DC Orders ED Discharge Orders         Ordered    albuterol (VENTOLIN HFA) 108 (90 Base) MCG/ACT inhaler  Every 4 hours PRN        10/22/19 1654           Lorayne Bender, PA-C 10/22/19 1909    Lucrezia Starch, MD 10/27/19 1536

## 2019-10-22 NOTE — Progress Notes (Addendum)
TOC CM spoke to pt and husband. He is willing to come and pick pt up. CM brought RW and gait belt to pt's room for dc home. Pt was having difficulty with ambulating. Cancelled PTAR. Faxed order to [facility] and made aware pt was provided walker from the closet.  [place], [facility] ED TOC CM [phone]

## 2019-10-22 NOTE — ED Notes (Addendum)
SATURATION QUALIFICATIONS: (This note is used to comply with regulatory documentation for home oxygen)  Patient Saturations on Room Air at Rest = 89%  Patient Saturations on Room Air while Ambulating = 86%  Patient Saturations on 2 Liters of oxygen while Ambulating = 95%  Please briefly explain why patient needs home oxygen:patient has low oxygen saturation without 2L nasal cannula. Pt COVID+.

## 2019-10-22 NOTE — ED Notes (Signed)
PTAR called  

## 2019-10-26 ENCOUNTER — Encounter: Payer: Self-pay | Admitting: Family Medicine

## 2019-10-27 ENCOUNTER — Encounter: Payer: Self-pay | Admitting: Family Medicine

## 2019-10-28 ENCOUNTER — Encounter: Payer: Self-pay | Admitting: Family Medicine

## 2019-11-01 ENCOUNTER — Encounter: Payer: Self-pay | Admitting: Family Medicine

## 2019-11-02 ENCOUNTER — Encounter: Payer: Self-pay | Admitting: Family Medicine

## 2019-11-05 ENCOUNTER — Encounter: Payer: Self-pay | Admitting: Family Medicine

## 2019-11-05 ENCOUNTER — Telehealth (INDEPENDENT_AMBULATORY_CARE_PROVIDER_SITE_OTHER): Payer: 59 | Admitting: Family Medicine

## 2019-11-05 VITALS — Temp 98.0°F | Ht 64.0 in | Wt 212.4 lb

## 2019-11-05 DIAGNOSIS — Z8616 Personal history of COVID-19: Secondary | ICD-10-CM

## 2019-11-05 NOTE — Progress Notes (Signed)
Established Patient Office Visit  Subjective:  Patient ID: [name], female    DOB: [birth year]  Age: 49 y.o. MRN: [id number]  CC:  Chief Complaint  Patient presents with  . Follow-up    follow up on positive COVID referral to PT     HPI [name] presents for follow-up of recent COVID-19 infection.  She is improving but is still experiencing fatigue and some hypoxia.  She continues with 1-1/2 to 2 L of oxygen.  Current use is intermittent and more with activity.  She still feels a little weak and is interested in physical therapy to improve her stamina.  She is no longer running a fever or with productive cough.  She continues with pulmonary rehabilitation at home.  She is scheduled for follow-up with pulmonology at the end of the month.  Patient was originally diagnosed on 8/22.  She returned to the emergency room on the 29th with hypoxia.  She was admitted for acute respiratory failure and treated with remdesivir.  Past Medical History:  Diagnosis Date  . Abnormal Pap smear 2000   [facility] Biopsy  . Allergy   . Arthritis    hands  . Back pain   . Blood transfusion without reported diagnosis 2005  . Breast fibroadenoma   . Diverticulitis of colon with perforation 05/20/2014  . GERD (gastroesophageal reflux disease)   . H/O metrorrhagia 12/2006  . H/O myomectomy 07-2006   robotic  . H/O sinusitis   . Headache(784.0)   . Heart murmur   . History of conization of cervix 2000  . History of ectopic pregnancy 2005   r salpyngectomy  . History of hysterectomy, supracervical 08-2008   robotic  . History of syphilis   . History of syphilis   . Hx of adenomatous polyp of colon 12/31/2017  . Hx of herpes simplex type 2 infection   . Hyperlipidemia   . Hypertension   . Ileostomy present ([place]) 10/26/2014  . Intra-abdominal abscess ([place]) 07/21/2014  . PMS (premenstrual syndrome)     Past Surgical History:  Procedure Laterality Date  . ABDOMINAL HYSTERECTOMY    .  BREAST LUMPECTOMY  10/11   rt-neg  . BREAST SURGERY  2004   fibroademoma  . CERVICAL CONE BIOPSY  2000  . COLON RESECTION N/A 07/06/2014   Procedure: LAPAROSCOPIC LOOP ILEOSTOMY WITH PLACEMENT OF PELVIC DRAIN;  Surgeon: Donnie Mesa, MD;  Location: [place];  Service: General;  Laterality: N/A;  . COLON SURGERY     diverticulitis   . COLONOSCOPY    . DILATION AND CURETTAGE OF UTERUS  2009  . DIVERTING ILEOSTOMY Right 06/2014  . ECTOPIC PREGNANCY SURGERY  2005   R salpyngectomy  . ILEOSTOMY N/A 10/26/2014   Procedure: LOOP ILEOSTOMY REVERSAL;  Surgeon: Donnie Mesa, MD;  Location: [place];  Service: General;  Laterality: N/A;  . LAPAROSCOPIC SIGMOID COLECTOMY N/A 06/30/2014   Procedure: LAPAROSCOPIC ASSISTED SIGMOID COLECTOMY;  Surgeon: Donnie Mesa, MD;  Location: [place];  Service: General;  Laterality: N/A;  . MYOMECTOMY  2008   robotic  . POLYPECTOMY  2009  . ROBOTIC ASSISTED LAP VAGINAL HYSTERECTOMY  2010   supracervical  . SALPINGECTOMY  2005  . TRIGGER FINGER RELEASE Right 09/28/2012   Procedure: RELEASE TRIGGER FINGER/A-1 PULLEY RIGHT THUMB;  Surgeon: Jolyn Nap, MD;  Location: [place];  Service: Orthopedics;  Laterality: Right;  . ULNAR NERVE TRANSPOSITION Right 12/01/2018   Procedure: ULNAR NERVE DECOMPRESSION;  Surgeon: Fredna Dow,  Dominica Severin, MD;  Location: [facility];  Service: Orthopedics;  Laterality: Right;  AXILLARY    Family History  Problem Relation Age of Onset  . Hypertension Paternal Grandmother   . Diabetes Paternal Grandmother   . Hypertension Maternal Grandmother   . Sarcoidosis Mother   . Hypertension Mother   . Hyperlipidemia Mother   . Thyroid disease Maternal Aunt   . Mental illness Cousin   . Fibroids Sister   . Colon cancer Neg Hx   . Colon polyps Neg Hx   . Esophageal cancer Neg Hx   . Rectal cancer Neg Hx   . Stomach cancer Neg Hx     Social History   Socioeconomic History  . Marital status: Married    Spouse name:  [name]  . Number of children: 2  . Years of education: Not on file  . Highest education level: Associate degree: occupational, Hotel manager, or vocational program  Occupational History  . Occupation: CMA    Comment: [employer] Use  . Smoking status: Former Smoker    Packs/day: 1.00    Years: 20.00    Pack years: 20.00    Types: Cigarettes    Quit date: 06/13/2011    Years since quitting: 8.4  . Smokeless tobacco: Never Used  Vaping Use  . Vaping Use: Never used  Substance and Sexual Activity  . Alcohol use: Not Currently  . Drug use: No  . Sexual activity: Yes  Other Topics Concern  . Not on file  Social History Narrative   Lives with her husband and their son.   Social Determinants of Health   Financial Resource Strain:   . Difficulty of Paying Living Expenses: Not on file  Food Insecurity:   . Worried About Charity fundraiser in the Last Year: Not on file  . Ran Out of Food in the Last Year: Not on file  Transportation Needs:   . Lack of Transportation (Medical): Not on file  . Lack of Transportation (Non-Medical): Not on file  Physical Activity:   . Days of Exercise per Week: Not on file  . Minutes of Exercise per Session: Not on file  Stress:   . Feeling of Stress : Not on file  Social Connections:   . Frequency of Communication with Friends and Family: Not on file  . Frequency of Social Gatherings with Friends and Family: Not on file  . Attends Religious Services: Not on file  . Active Member of Clubs or Organizations: Not on file  . Attends Archivist Meetings: Not on file  . Marital Status: Not on file  Intimate Partner Violence:   . Fear of Current or Ex-Partner: Not on file  . Emotionally Abused: Not on file  . Physically Abused: Not on file  . Sexually Abused: Not on file    Outpatient Medications Prior to Visit  Medication Sig Dispense Refill  . acetaminophen (TYLENOL) 500 MG tablet Take 500 mg by mouth every 6 (six)  hours as needed for fever.    Marland Kitchen albuterol (VENTOLIN HFA) 108 (90 Base) MCG/ACT inhaler Inhale 2 puffs into the lungs every 4 (four) hours as needed for wheezing or shortness of breath. 18 g 1  . amLODipine (NORVASC) 5 MG tablet Take 1 tablet (5 mg total) by mouth daily. 90 tablet 0  . atorvastatin (LIPITOR) 20 MG tablet Take 1 tablet (20 mg total) by mouth daily. 90 tablet 0  . lisinopril (ZESTRIL) 10 MG  tablet Take 1 tablet (10 mg total) by mouth daily. 90 tablet 0  . Multiple Vitamins-Minerals (HAIR/SKIN/NAILS/BIOTIN PO) Take 1 tablet by mouth daily.    . Black Cohosh (REMIFEMIN) 20 MG TABS Take 40 mg by mouth daily. (Patient not taking: Reported on 11/05/2019)    . nitroGLYCERIN (NITROSTAT) 0.4 MG SL tablet Place 1 tablet (0.4 mg total) under the tongue every 5 (five) minutes as needed for up to 25 days for chest pain. 25 tablet 1  . ondansetron (ZOFRAN ODT) 4 MG disintegrating tablet Take 1 tablet (4 mg total) by mouth every 8 (eight) hours as needed for nausea or vomiting. (Patient not taking: Reported on 11/05/2019) 18 tablet 0  . promethazine (PHENERGAN) 25 MG tablet Take 1 tablet (25 mg total) by mouth every 8 (eight) hours as needed for nausea or vomiting. (Patient not taking: Reported on 11/05/2019) 20 tablet 0   No facility-administered medications prior to visit.    Allergies  Allergen Reactions  . Keflex [Cephalexin] Anaphylaxis  . Penicillins Anaphylaxis    Has patient had a PCN reaction causing immediate rash, facial/tongue/throat swelling, SOB or lightheadedness with hypotension: Yes Has patient had a PCN reaction causing severe rash involving mucus membranes or skin necrosis: No Has patient had a PCN reaction that required hospitalization: No Has patient had a PCN reaction occurring within the last 10 years: No If all of the above answers are "NO", then may proceed with Cephalosporin use.     ROS Review of Systems  Constitutional: Negative for chills, diaphoresis, fatigue,  fever and unexpected weight change.  HENT: Negative.   Respiratory: Negative for cough, shortness of breath and wheezing.   Cardiovascular: Negative.   Gastrointestinal: Negative.   Neurological: Negative for tremors, speech difficulty and light-headedness.  Hematological: Does not bruise/bleed easily.  Psychiatric/Behavioral: Negative.       Objective:    Physical Exam Vitals and nursing note reviewed.  Constitutional:      General: She is not in acute distress.    Appearance: Normal appearance. She is not ill-appearing, toxic-appearing or diaphoretic.  HENT:     Head: Normocephalic and atraumatic.     Right Ear: External ear normal.     Left Ear: External ear normal.     Nose:   Eyes:     General: No scleral icterus.       Right eye: No discharge.        Left eye: No discharge.     Conjunctiva/sclera: Conjunctivae normal.  Pulmonary:     Effort: Pulmonary effort is normal.  Neurological:     Mental Status: She is alert and oriented to person, place, and time.  Psychiatric:        Mood and Affect: Mood normal.        Behavior: Behavior normal.     Temp 98 F (36.7 C) (Tympanic)   Ht 5\' 4"  (1.626 m)   Wt 212 lb 6.4 oz (96.3 kg)   BMI 36.46 kg/m  Wt Readings from Last 3 Encounters:  11/05/19 212 lb 6.4 oz (96.3 kg)  10/22/19 221 lb (100.2 kg)  10/17/19 221 lb (100.2 kg)     Health Maintenance Due  Topic Date Due  . Hepatitis C Screening  Never done  . COVID-19 Vaccine (1) Never done  . PAP SMEAR-Modifier  06/19/2019  . INFLUENZA VACCINE  09/19/2019    There are no preventive care reminders to display for this patient.  Lab Results  Component Value Date  TSH 1.14 10/04/2019   Lab Results  Component Value Date   WBC 11.8 (H) 10/22/2019   HGB 10.8 (L) 10/22/2019   HCT 33.4 (L) 10/22/2019   MCV 90.8 10/22/2019   PLT 340 10/22/2019   Lab Results  Component Value Date   NA 138 10/22/2019   K 3.6 10/22/2019   CO2 25 10/22/2019   GLUCOSE 100 (H)  10/22/2019   BUN 13 10/22/2019   CREATININE 0.69 10/22/2019   BILITOT 0.7 10/21/2019   ALKPHOS 41 10/21/2019   AST 27 10/21/2019   ALT 26 10/21/2019   PROT 6.8 10/21/2019   ALBUMIN 3.4 (L) 10/21/2019   CALCIUM 8.4 (L) 10/22/2019   ANIONGAP 11 10/22/2019   GFR 80.02 10/04/2019   Lab Results  Component Value Date   CHOL 178 10/04/2019   Lab Results  Component Value Date   HDL 67.80 10/04/2019   Lab Results  Component Value Date   LDLCALC 97 10/04/2019   Lab Results  Component Value Date   TRIG 85 10/17/2019   Lab Results  Component Value Date   CHOLHDL 3 10/04/2019   Lab Results  Component Value Date   HGBA1C 5.9 10/04/2019      Assessment & Plan:   Problem List Items Addressed This Visit      Other   History of COVID-19 - Primary   Relevant Orders   Ambulatory referral to Physical Therapy      No orders of the defined types were placed in this encounter.   Follow-up: Return if symptoms worsen or fail to improve.   Out of work through October 4.  Brief course of physical therapy to improve strength and stamina.  She does have scheduled follow-up with pulmonology.  They will decide if more time off is needed.  She will plan on having her vaccine in December. Libby Maw, MD   Virtual Visit via Video Note  I connected with Sheetal Lyall on 11/05/19 at  2:30 PM EDT by a video enabled telemedicine application and verified that I am speaking with the correct person using two identifiers.  Location: Patient: home with her husband.  Provider:    I discussed the limitations of evaluation and management by telemedicine and the availability of in person appointments. The patient expressed understanding and agreed to proceed.  History of Present Illness:    Observations/Objective:   Assessment and Plan:   Follow Up Instructions:    I discussed the assessment and treatment plan with the patient. The patient was provided an opportunity  to ask questions and all were answered. The patient agreed with the plan and demonstrated an understanding of the instructions.   The patient was advised to call back or seek an in-person evaluation if the symptoms worsen or if the condition fails to improve as anticipated.  I provided 30 minutes of non-face-to-face time during this encounter.   Libby Maw, MD

## 2019-11-22 ENCOUNTER — Encounter: Payer: Self-pay | Admitting: Pulmonary Disease

## 2019-11-22 ENCOUNTER — Ambulatory Visit (INDEPENDENT_AMBULATORY_CARE_PROVIDER_SITE_OTHER): Payer: 59 | Admitting: Pulmonary Disease

## 2019-11-22 ENCOUNTER — Ambulatory Visit (INDEPENDENT_AMBULATORY_CARE_PROVIDER_SITE_OTHER): Payer: 59

## 2019-11-22 ENCOUNTER — Other Ambulatory Visit: Payer: Self-pay

## 2019-11-22 VITALS — BP 132/72 | HR 104 | Temp 97.4°F | Ht 64.0 in | Wt 214.0 lb

## 2019-11-22 DIAGNOSIS — R0689 Other abnormalities of breathing: Secondary | ICD-10-CM | POA: Diagnosis not present

## 2019-11-22 DIAGNOSIS — R000 Tachycardia, unspecified: Secondary | ICD-10-CM

## 2019-11-22 DIAGNOSIS — R0600 Dyspnea, unspecified: Secondary | ICD-10-CM

## 2019-11-22 DIAGNOSIS — U071 COVID-19: Secondary | ICD-10-CM | POA: Diagnosis not present

## 2019-11-22 DIAGNOSIS — J9601 Acute respiratory failure with hypoxia: Secondary | ICD-10-CM

## 2019-11-22 NOTE — Assessment & Plan Note (Signed)
Mild tachycardic today but likely related to acute illness rather than SVT

## 2019-11-22 NOTE — Progress Notes (Signed)
Subjective:    Patient ID: [name], female    DOB: 1970-07-10, 49 y.o.   MRN: [id number]  HPI  49 year old ex-smoker who presents for evaluation of acute hypoxic respiratory failure following Covid pneumonia.  She is unvaccinated.  Her husband got Covid infection and she was the primary caregiver.  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  She tested positive for Covid infection 8/22 at [facility] and was discharged home. She returned on 8/29 with shortness of breath, cough, nausea and vomiting with saturation down to 91% on room air.  CT abdomen/pelvis was negative.  She was treated with monoclonal antibodies and remdesivir and steroids.  On discharge 9/2, she did not seem to require oxygen but as soon as she got home she was more short of breath and was desaturating and returned to the emergency room on 9/3 where her oxygen saturation was 88% on room air.  Hence she was provided with home oxygen which she used for about 2 weeks. She has now slowly weaned herself off the oxygen.  She reports residual dyspnea on exertion and fatigue.  She has not needed oxygen for the last 2 weeks.  Slight tachycardic today  Cough has resolved, no fevers no sleep issues or headaches.  She needs a letter for work and has short-term disability papers to be filled out  PMH -hyperlipidemia, hypertension She is an ex-smoker smoked 1/2 pack/day for about 20 years before she quit in 2013   Past Medical History:  Diagnosis Date  . Abnormal Pap smear 2000   [facility] Biopsy  . Allergy   . Arthritis    hands  . Back pain   . Blood transfusion without reported diagnosis 2005  . Breast fibroadenoma   . Diverticulitis of colon with perforation 05/20/2014  . GERD (gastroesophageal reflux disease)   . H/O metrorrhagia 12/2006  . H/O myomectomy 07-2006   robotic  . H/O sinusitis   . Headache(784.0)   . Heart murmur   . History of conization of cervix 2000  . History of ectopic pregnancy  2005   r salpyngectomy  . History of hysterectomy, supracervical 08-2008   robotic  . History of syphilis   . History of syphilis   . Hx of adenomatous polyp of colon 12/31/2017  . Hx of herpes simplex type 2 infection   . Hyperlipidemia   . Hypertension   . Ileostomy present ([place]) 10/26/2014  . Intra-abdominal abscess ([place]) 07/21/2014  . PMS (premenstrual syndrome)     Past Surgical History:  Procedure Laterality Date  . ABDOMINAL HYSTERECTOMY    . BREAST LUMPECTOMY  10/11   rt-neg  . BREAST SURGERY  2004   fibroademoma  . CERVICAL CONE BIOPSY  2000  . COLON RESECTION N/A 07/06/2014   Procedure: LAPAROSCOPIC LOOP ILEOSTOMY WITH PLACEMENT OF PELVIC DRAIN;  Surgeon: Donnie Mesa, MD;  Location: [facility];  Service: General;  Laterality: N/A;  . COLON SURGERY     diverticulitis   . COLONOSCOPY    . DILATION AND CURETTAGE OF UTERUS  2009  . DIVERTING ILEOSTOMY Right 06/2014  . ECTOPIC PREGNANCY SURGERY  2005   R salpyngectomy  . ILEOSTOMY N/A 10/26/2014   Procedure: LOOP ILEOSTOMY REVERSAL;  Surgeon: Donnie Mesa, MD;  Location: [facility];  Service: General;  Laterality: N/A;  . LAPAROSCOPIC SIGMOID COLECTOMY N/A 06/30/2014   Procedure: LAPAROSCOPIC ASSISTED SIGMOID COLECTOMY;  Surgeon: Donnie Mesa, MD;  Location: [place];  Service: General;  Laterality: N/A;  . MYOMECTOMY  2008   robotic  . POLYPECTOMY  2009  . ROBOTIC ASSISTED LAP VAGINAL HYSTERECTOMY  2010   supracervical  . SALPINGECTOMY  2005  . TRIGGER FINGER RELEASE Right 09/28/2012   Procedure: RELEASE TRIGGER FINGER/A-1 PULLEY RIGHT THUMB;  Surgeon: Jolyn Nap, MD;  Location: [place];  Service: Orthopedics;  Laterality: Right;  . ULNAR NERVE TRANSPOSITION Right 12/01/2018   Procedure: ULNAR NERVE DECOMPRESSION;  Surgeon: Daryll Brod, MD;  Location: [place];  Service: Orthopedics;  Laterality: Right;  AXILLARY    Allergies  Allergen Reactions  . Keflex [Cephalexin] Anaphylaxis  .  Penicillins Anaphylaxis    Has patient had a PCN reaction causing immediate rash, facial/tongue/throat swelling, SOB or lightheadedness with hypotension: Yes Has patient had a PCN reaction causing severe rash involving mucus membranes or skin necrosis: No Has patient had a PCN reaction that required hospitalization: No Has patient had a PCN reaction occurring within the last 10 years: No If all of the above answers are "NO", then may proceed with Cephalosporin use.     Social History   Socioeconomic History  . Marital status: Married    Spouse name: [name]  . Number of children: 2  . Years of education: Not on file  . Highest education level: Associate degree: occupational, Hotel manager, or vocational program  Occupational History  . Occupation: CMA    Comment: [employer] Use  . Smoking status: Former Smoker    Packs/day: 1.00    Years: 20.00    Pack years: 20.00    Types: Cigarettes    Quit date: 06/13/2011    Years since quitting: 8.4  . Smokeless tobacco: Never Used  Vaping Use  . Vaping Use: Never used  Substance and Sexual Activity  . Alcohol use: Not Currently  . Drug use: No  . Sexual activity: Yes  Other Topics Concern  . Not on file  Social History Narrative   Lives with her husband and their son.   Social Determinants of Health   Financial Resource Strain:   . Difficulty of Paying Living Expenses: Not on file  Food Insecurity:   . Worried About Charity fundraiser in the Last Year: Not on file  . Ran Out of Food in the Last Year: Not on file  Transportation Needs:   . Lack of Transportation (Medical): Not on file  . Lack of Transportation (Non-Medical): Not on file  Physical Activity:   . Days of Exercise per Week: Not on file  . Minutes of Exercise per Session: Not on file  Stress:   . Feeling of Stress : Not on file  Social Connections:   . Frequency of Communication with Friends and Family: Not on file  . Frequency of Social  Gatherings with Friends and Family: Not on file  . Attends Religious Services: Not on file  . Active Member of Clubs or Organizations: Not on file  . Attends Archivist Meetings: Not on file  . Marital Status: Not on file  Intimate Partner Violence:   . Fear of Current or Ex-Partner: Not on file  . Emotionally Abused: Not on file  . Physically Abused: Not on file  . Sexually Abused: Not on file    Family History  Problem Relation Age of Onset  . Hypertension Paternal Grandmother   . Diabetes Paternal Grandmother   . Hypertension Maternal Grandmother   . Sarcoidosis Mother   .  Hypertension Mother   . Hyperlipidemia Mother   . Thyroid disease Maternal Aunt   . Mental illness Cousin   . Fibroids Sister   . Colon cancer Neg Hx   . Colon polyps Neg Hx   . Esophageal cancer Neg Hx   . Rectal cancer Neg Hx   . Stomach cancer Neg Hx      Review of Systems  Shortness of breath with activity Acid heartburn and indigestion Weight gain Abdominal pain   Constitutional: negative for anorexia, fevers and sweats  Eyes: negative for irritation, redness and visual disturbance  Ears, nose, mouth, throat, and face: negative for earaches, epistaxis, nasal congestion and sore throat  Respiratory: negative for cough,  sputum and wheezing  Cardiovascular: negative for chest pain,  lower extremity edema, orthopnea, palpitations and syncope  Gastrointestinal: negative for  constipation, diarrhea, melena, nausea and vomiting  Genitourinary:negative for dysuria, frequency and hematuria  Hematologic/lymphatic: negative for bleeding, easy bruising and lymphadenopathy  Musculoskeletal:negative for arthralgias, muscle weakness and stiff joints  Neurological: negative for coordination problems, gait problems, headaches and weakness  Endocrine: negative for diabetic symptoms including polydipsia, polyuria and weight loss      Objective:   Physical Exam  Gen. Pleasant, obese, in no  distress, normal affect ENT - no pallor,icterus, no post nasal drip, class 2-3 airway Neck: No JVD, no thyromegaly, no carotid bruits Lungs: no use of accessory muscles, no dullness to percussion, decreased without rales or rhonchi  Cardiovascular: Rhythm regular, heart sounds  normal, no murmurs or gallops, no peripheral edema Abdomen: soft and non-tender, no hepatosplenomegaly, BS normal. Musculoskeletal: No deformities, no cyanosis or clubbing Neuro:  alert, non focal, no tremors       Assessment & Plan:

## 2019-11-22 NOTE — Patient Instructions (Signed)
Chest x-ray today. Short-term disability paper work filled out. Letter given for work You can discontinue oxygen use as long as saturations above 90% -call us when you are ready to return oxygen and we will send order to DME.  Covid vaccination after 90 days

## 2019-11-22 NOTE — Addendum Note (Signed)
Addended byDocia Barrier on: 11/22/2019 11:02 AM   Modules accepted: Orders

## 2019-11-22 NOTE — Assessment & Plan Note (Signed)
Chest x-ray today for resolution of pneumonia. She is 75% improved but has residual fatigue.  Oxygen saturations stays well. 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

## 2019-11-22 NOTE — Assessment & Plan Note (Signed)
Short-term disability paper work filled out. Letter given for work You can discontinue oxygen use as long as saturations above 90% -call us when you are ready to return oxygen and we will send order to DME.

## 2019-11-23 ENCOUNTER — Other Ambulatory Visit: Payer: Self-pay

## 2019-11-23 DIAGNOSIS — J9601 Acute respiratory failure with hypoxia: Secondary | ICD-10-CM

## 2019-11-23 NOTE — Progress Notes (Signed)
Called and spoke with patient about xray results per Dr Elsworth Soho. Patient expressed understanding. Patient requested DME order to be placed for [facility] to come pick up O2 tanks due to patient stating she no longer needs them. Placed order per Dr Elsworth Soho note. Nothing further needed at this time.

## 2019-11-23 NOTE — Progress Notes (Signed)
am

## 2019-11-24 ENCOUNTER — Telehealth: Payer: Self-pay | Admitting: Pulmonary Disease

## 2019-11-24 DIAGNOSIS — J9601 Acute respiratory failure with hypoxia: Secondary | ICD-10-CM

## 2019-11-24 NOTE — Telephone Encounter (Signed)
Dr. Elsworth Soho placed and order yesterday to have [facility] pick up 02 tanks.  Tonya from [place] spoke with the patient Hello, spoke w/ pt she would like all 02 p/u not just tanks. Need d/c order for all 02.    will Dr. Elsworth Soho agree with picking up all 02

## 2019-11-24 NOTE — Telephone Encounter (Signed)
Okay to discontinue oxygen

## 2019-11-24 NOTE — Telephone Encounter (Signed)
Order sent to DME and I spoke with the pt's spouse and made aware  Nothing further needed

## 2019-11-24 NOTE — Telephone Encounter (Signed)
Dr. Elsworth Soho would you like to D/C all O2 for pt? If so please advise. Thank you. LOV 11/25/19

## 2019-11-29 ENCOUNTER — Other Ambulatory Visit: Payer: Self-pay

## 2019-11-29 ENCOUNTER — Encounter: Payer: Self-pay | Admitting: Physical Therapy

## 2019-11-29 ENCOUNTER — Ambulatory Visit: Payer: 59 | Attending: Family Medicine | Admitting: Physical Therapy

## 2019-11-29 DIAGNOSIS — R2689 Other abnormalities of gait and mobility: Secondary | ICD-10-CM | POA: Insufficient documentation

## 2019-11-29 DIAGNOSIS — M6281 Muscle weakness (generalized): Secondary | ICD-10-CM | POA: Diagnosis present

## 2019-11-29 NOTE — Patient Instructions (Signed)
Access Code: [email or web address] URL: [email or web address] Date: 11/29/2019 Prepared by: Carolyne Littles  Exercises .Seated Knee Extension with Resistance - 1 x daily - 7 x weekly - 3 sets - 10 reps .Seated Hip Abduction with Resistance - 1 x daily - 7 x weekly - 3 sets - 10 reps .Seated Hamstring Curls with Resistance - 1 x daily - 7 x weekly - 3 sets - 10 reps

## 2019-11-30 ENCOUNTER — Encounter: Payer: Self-pay | Admitting: Physical Therapy

## 2019-11-30 NOTE — Addendum Note (Signed)
Addended by: Carney Living on: 11/30/2019 12:06 PM   Modules accepted: Orders

## 2019-11-30 NOTE — Therapy (Signed)
[place], [place], [address] Phone: [phone]   Fax:  [phone]  Physical Therapy Evaluation  Patient Details  Name: Trinaty Bundrick MRN: [id number] Date of Birth: 1970/10/15 Referring Provider (PT): Dr Abelino Derrick    Encounter Date: 11/29/2019   PT End of Session - 11/29/19 1316    Visit Number 1    Number of Visits 6    Date for PT Re-Evaluation 01/10/20    Authorization Type [employer]    PT Start Time 1015    PT Stop Time 1059    PT Time Calculation (min) 44 min    Activity Tolerance Patient tolerated treatment well    Behavior During Therapy [facility] for tasks assessed/performed           Past Medical History:  Diagnosis Date   Abnormal Pap smear 2000   [facility] Biopsy   Allergy    Arthritis    hands   Back pain    Blood transfusion without reported diagnosis 2005   Breast fibroadenoma    Diverticulitis of colon with perforation 05/20/2014   GERD (gastroesophageal reflux disease)    H/O metrorrhagia 12/2006   H/O myomectomy 07-2006   robotic   H/O sinusitis    Headache(784.0)    Heart murmur    History of conization of cervix 2000   History of ectopic pregnancy 2005   r salpyngectomy   History of hysterectomy, supracervical 08-2008   robotic   History of syphilis    History of syphilis    Hx of adenomatous polyp of colon 12/31/2017   Hx of herpes simplex type 2 infection    Hyperlipidemia    Hypertension    Ileostomy present ([place]) 10/26/2014   Intra-abdominal abscess ([place]) 07/21/2014   PMS (premenstrual syndrome)     Past Surgical History:  Procedure Laterality Date   ABDOMINAL HYSTERECTOMY     BREAST LUMPECTOMY  10/11   rt-neg   BREAST SURGERY  2004   fibroademoma   CERVICAL CONE BIOPSY  2000   COLON RESECTION N/A 07/06/2014   Procedure: LAPAROSCOPIC LOOP ILEOSTOMY WITH PLACEMENT OF PELVIC DRAIN;  Surgeon: Donnie Mesa, MD;  Location: [place];  Service: General;  Laterality: N/A;   COLON SURGERY     diverticulitis    COLONOSCOPY     DILATION AND CURETTAGE OF UTERUS  2009   DIVERTING ILEOSTOMY Right 06/2014   ECTOPIC PREGNANCY SURGERY  2005   R salpyngectomy   ILEOSTOMY N/A 10/26/2014   Procedure: LOOP ILEOSTOMY REVERSAL;  Surgeon: Donnie Mesa, MD;  Location: [place];  Service: General;  Laterality: N/A;   LAPAROSCOPIC SIGMOID COLECTOMY N/A 06/30/2014   Procedure: LAPAROSCOPIC ASSISTED SIGMOID COLECTOMY;  Surgeon: Donnie Mesa, MD;  Location: [place] [facility];  Service: General;  Laterality: N/A;   MYOMECTOMY  2008   robotic   POLYPECTOMY  2009   ROBOTIC ASSISTED LAP VAGINAL HYSTERECTOMY  2010   supracervical   SALPINGECTOMY  2005   TRIGGER FINGER RELEASE Right 09/28/2012   Procedure: RELEASE TRIGGER FINGER/A-1 PULLEY RIGHT THUMB;  Surgeon: Jolyn Nap, MD;  Location: [place];  Service: Orthopedics;  Laterality: Right;   ULNAR NERVE TRANSPOSITION Right 12/01/2018   Procedure: ULNAR NERVE DECOMPRESSION;  Surgeon: Daryll Brod, MD;  Location: [place];  Service: Orthopedics;  Laterality: Right;  AXILLARY    There were no vitals filed for this visit.    Subjective Assessment - 11/29/19 1012  Subjective Patient had a COVID 19th ifection on 10/10/2019. Since that point she has expierienced SOB with daily activity. She recently went to her pulmonologist who reports her lungs are aat about 75% capactiy at this time. She is also haveing some low back pain. She had that at baseline. Her low back hurts when she bends over.    Pertinent History Low Back pain; ulnar nerve decompression 2020; herat murmor,    How long can you sit comfortably? no limit    How long can you stand comfortably? can stand for a while but can depend on what she is doing    How long can you walk comfortably? gets tired walking around for too long    Diagnostic tests Lung X-ray:    Patient Stated Goals to improve  endurance with fdaily activity    Currently in Pain? --   No pain at this time but can have pain bending             [facility] PT Assessment - 11/30/19 0001      Assessment   Medical Diagnosis Deconditioning     Referring Provider (PT) Dr Abelino Derrick     Onset Date/Surgical Date 10/10/19    Hand Dominance Right    Next MD Visit February     Prior Therapy None       Precautions   Precautions None      Restrictions   Weight Bearing Restrictions No      Balance Screen   Has the patient fallen in the past 6 months No    Has the patient had a decrease in activity level because of a fear of falling?  No    Is the patient reluctant to leave their home because of a fear of falling?  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     Leisure Wants to loose some weight      Cognition   Overall Cognitive Status Within Functional Limits for tasks assessed    Attention Focused    Focused Attention Appears intact    Memory Appears intact    Awareness Appears intact    Problem Solving Appears intact      Observation/Other Assessments   Focus on Therapeutic Outcomes (FOTO)  47% limited 30% expected       Sensation   Light Touch Appears Intact    Additional Comments numbness into her hands at times       Coordination   Gross Motor Movements are Fluid and Coordinated Yes    Fine Motor Movements are Fluid and Coordinated Yes      Strength   Overall Strength Comments knees and UE 5//5     Right Hand Grip (lbs) 30     Left Hand Grip (lbs) 30     Right Hip Flexion 4+/5    Left Hip Flexion 4+/5      Transfers   Comments 5x sit to stand  16 Sao2 89% HR 103       6 Minute Walk- Baseline   6 Minute Walk- Baseline yes    HR (bpm) 88    02 Sat (%RA) 93 %      6 Minute walk- Post Test   6 Minute Walk  Post Test yes    HR (bpm) 116    02 Sat (%RA) 94 %    Modified Borg Scale for Dyspnea 2- Mild shortness of breath    Perceived Rate of Exertion (Borg) 13- Somewhat hard                      Objective measurements completed on examination: See above findings.       [place] Adult PT Treatment/Exercise - 11/30/19 0001      Exercises   Exercises Knee/Hip      Knee/Hip Exercises: Seated   Other Seated Knee/Hip Exercises LAq red 2x10; seated clamshell 2x10 red; seated hamstring 2x10                   PT Education - 11/29/19 1044    Education Details reviewed expected progression of activity; reviewed tests and measures; HEP    Person(s) Educated Patient    Methods Explanation;Demonstration;Handout;Verbal cues    Comprehension Verbalized understanding;Returned demonstration;Verbal cues required;Tactile cues required            PT Short Term Goals - 11/29/19 1331      PT SHORT TERM GOAL #1   Title Patient will increase 5/5 gross bilateral LE strength    Time 6    Period Weeks    Status New    Target Date 01/10/20      PT SHORT TERM GOAL #2   Title Patient will be independnet with basic HEP    Time 6    Period Weeks    Status New    Target Date 01/10/20      PT SHORT TERM GOAL #3   Title Patient will decrease 5x sit to stand test time to 12 seconds    Time 6    Period Weeks    Status New    Target Date 01/10/20             PT Long Term Goals - 11/29/19 1333      PT LONG TERM GOAL #1   Title Patientwill report no shortness of breath while walking 2000' on the 6 minute walk test in order to improve community ambualtion    Time 6    Period Weeks    Status New    Target Date 01/11/20      PT LONG TERM GOAL #2   Title Patient will go up and down 6 steps without fatigue in order to improve ability to walk in the community.    Time 6    Period Weeks    Status New    Target Date 01/11/20      PT LONG TERM GOAL #3   Title Patient will  return to exercise program to improve general health and wellness    Time 6    Period Weeks    Status New    Target Date 01/11/20                  Plan - 11/29/19 1319    Clinical Impression Statement Patient is a 49 year old female with deconidtioning following a Covid infection in august of 2021. She presents with limited enurance with fucntional tasks. She ambualted 1110 feet on her 6 minute walk test. Her Sao2 increased with the walk test but she was SOB and her HR was elevated. She would benefit from skilled therapy to improve ability to perfrom ADL's and to return to exercises.    Personal Factors  and Comorbidities Comorbidity 1;Comorbidity 2;Comorbidity 3+    Comorbidities heart murmur, COVID 19; ulnar nerve transposition    Examination-Activity Limitations Bed Mobility;Carry;Lift;Locomotion Level;Squat    Examination-Participation Restrictions Cleaning;Community Activity;Driving;Shop    Stability/Clinical Decision Making Evolving/Moderate complexity   decreased endurance with activity   Clinical Decision Making Moderate    Rehab Potential Excellent    PT Frequency 1x / week    PT Duration 6 weeks    PT Treatment/Interventions ADLs/Self Care Home Management;Electrical Stimulation;Iontophoresis 4mg /ml Dexamethasone;Moist Heat;Ultrasound;Gait training;DME Instruction;Functional mobility training;Therapeutic activities;Therapeutic exercise;Neuromuscular re-education;Patient/family education;Manual techniques;Passive range of motion    PT Next Visit Plan consdier light intervals on the nu-step or bike. Progress as tolerated. Add in UEexercises; review HEP, monitor with treatment    PT Home Exercise Plan laq, hamstring curl, hip abduction    Consulted and Agree with Plan of Care Patient           Patient will benefit from skilled therapeutic intervention in order to improve the following deficits and impairments:  Difficulty walking, Decreased endurance, Decreased strength,  Decreased activity tolerance  Visit Diagnosis: Other abnormalities of gait and mobility  Muscle weakness (generalized)     Problem List Patient Active Problem List   Diagnosis Date Noted   COVID-19 virus infection 10/19/2019   Mixed hyperlipidemia 10/19/2019   Class 2 severe obesity due to excess calories with serious comorbidity and body mass index (BMI) of 37.0 to 37.9 in adult ([place]) 10/19/2019   Acute respiratory failure with hypoxia ([place]) 10/19/2019   Left leg pain 10/04/2019   Elevated glucose 10/08/2018   Elevated LDL cholesterol level 10/08/2018   Healthcare maintenance 10/08/2018   Need for immunization against influenza 10/08/2018   Obesity (BMI 30-39.9) 03/29/2018   Acute chest pain 03/27/2018   Nonspecific abnormal electrocardiogram (ECG) (EKG) 03/27/2018   Atypical chest pain 03/27/2018   Hand pain, right 03/25/2018   Dyslipidemia 08/15/2017   History of hysterectomy, supracervical 02/14/2017   Trigger ring finger of right hand 08/07/2015   Primary osteoarthritis of first carpometacarpal joint of right hand 08/07/2015   Diverticulitis of sigmoid colon s/p colectomy 06/30/2014 07/26/2014   Tachycardia 02/04/2014   Hx of adenomatous polyp of colon 12/23/2013   Essential hypertension 11/02/2013   Former smoker 11/26/2008     Carney Living  PT DPT  11/30/2019, 12:01 PM  [facility] [address] [place], [place], [address] Phone: [phone]   Fax:  [phone]  Name: Donise Woodle MRN: [id number] Date of Birth: November 01, 1970

## 2019-12-04 ENCOUNTER — Other Ambulatory Visit: Payer: Self-pay | Admitting: Family Medicine

## 2019-12-04 DIAGNOSIS — I10 Essential (primary) hypertension: Secondary | ICD-10-CM

## 2019-12-06 ENCOUNTER — Other Ambulatory Visit: Payer: Self-pay

## 2019-12-06 ENCOUNTER — Ambulatory Visit: Payer: 59 | Admitting: Physical Therapy

## 2019-12-06 DIAGNOSIS — M6281 Muscle weakness (generalized): Secondary | ICD-10-CM

## 2019-12-06 DIAGNOSIS — R2689 Other abnormalities of gait and mobility: Secondary | ICD-10-CM | POA: Diagnosis not present

## 2019-12-06 MED ORDER — LISINOPRIL 10 MG PO TABS: 10.0000 mg | ORAL_TABLET | Freq: Every day | ORAL | 0 refills | Status: DC

## 2019-12-06 NOTE — Therapy (Signed)
[place], [place], [address] Phone: [phone]   Fax:  [phone]  Physical Therapy Treatment  Patient Details  Name: [name] MRN: [id number] Date of Birth: July 22, 1970 Referring Provider (PT): Dr Abelino Derrick    Encounter Date: 12/06/2019   PT End of Session - 12/06/19 0935    Visit Number 2    Number of Visits 6    Date for PT Re-Evaluation 01/10/20    Authorization Type [employer]    PT Start Time 0930    PT Stop Time 1010    PT Time Calculation (min) 40 min    Activity Tolerance Patient tolerated treatment well    Behavior During Therapy [facility] for tasks assessed/performed           Past Medical History:  Diagnosis Date   Abnormal Pap smear 2000   [facility] Biopsy   Allergy    Arthritis    hands   Back pain    Blood transfusion without reported diagnosis 2005   Breast fibroadenoma    Diverticulitis of colon with perforation 05/20/2014   GERD (gastroesophageal reflux disease)    H/O metrorrhagia 12/2006   H/O myomectomy 07-2006   robotic   H/O sinusitis    Headache(784.0)    Heart murmur    History of conization of cervix 2000   History of ectopic pregnancy 2005   r salpyngectomy   History of hysterectomy, supracervical 08-2008   robotic   History of syphilis    History of syphilis    Hx of adenomatous polyp of colon 12/31/2017   Hx of herpes simplex type 2 infection    Hyperlipidemia    Hypertension    Ileostomy present ([place]) 10/26/2014   Intra-abdominal abscess ([place]) 07/21/2014   PMS (premenstrual syndrome)     Past Surgical History:  Procedure Laterality Date   ABDOMINAL HYSTERECTOMY     BREAST LUMPECTOMY  10/11   rt-neg   BREAST SURGERY  2004   fibroademoma   CERVICAL CONE BIOPSY  2000   COLON RESECTION N/A 07/06/2014   Procedure: LAPAROSCOPIC LOOP ILEOSTOMY WITH PLACEMENT OF PELVIC DRAIN;  Surgeon: Donnie Mesa, MD;  Location: [place];  Service: General;  Laterality: N/A;   COLON SURGERY     diverticulitis    COLONOSCOPY     DILATION AND CURETTAGE OF UTERUS  2009   DIVERTING ILEOSTOMY Right 06/2014   ECTOPIC PREGNANCY SURGERY  2005   R salpyngectomy   ILEOSTOMY N/A 10/26/2014   Procedure: LOOP ILEOSTOMY REVERSAL;  Surgeon: Donnie Mesa, MD;  Location: [place];  Service: General;  Laterality: N/A;   LAPAROSCOPIC SIGMOID COLECTOMY N/A 06/30/2014   Procedure: LAPAROSCOPIC ASSISTED SIGMOID COLECTOMY;  Surgeon: Donnie Mesa, MD;  Location: [place] [facility];  Service: General;  Laterality: N/A;   MYOMECTOMY  2008   robotic   POLYPECTOMY  2009   ROBOTIC ASSISTED LAP VAGINAL HYSTERECTOMY  2010   supracervical   SALPINGECTOMY  2005   TRIGGER FINGER RELEASE Right 09/28/2012   Procedure: RELEASE TRIGGER FINGER/A-1 PULLEY RIGHT THUMB;  Surgeon: Jolyn Nap, MD;  Location: [place];  Service: Orthopedics;  Laterality: Right;   ULNAR NERVE TRANSPOSITION Right 12/01/2018   Procedure: ULNAR NERVE DECOMPRESSION;  Surgeon: Daryll Brod, MD;  Location: [place];  Service: Orthopedics;  Laterality: Right;  AXILLARY    There were no vitals filed for this visit.   Subjective Assessment - 12/06/19 0933    Subjective  Patient has no complaints this morning. Her baseline sao2 is 99% and HR is 90. She has been doing her exercises without much difficulty. She did her exerciswes over the weekend. Baseline B/P 124 /88.    Pertinent History Low Back pain; ulnar nerve decompression 2020; herat murmor,    How long can you sit comfortably? no limit    How long can you stand comfortably? can stand for a while but can depend on what she is doing    How long can you walk comfortably? gets tired walking around for too long    Diagnostic tests Lung X-ray:    Patient Stated Goals to improve endurance with fdaily activity    Currently in Pain? No/denies                             [facility] Adult  PT Treatment/Exercise - 12/06/19 0001      Self-Care   Self-Care Other Self-Care Comments    Other Self-Care Comments  education on actibvity progression and target HR      Knee/Hip Exercises: Aerobic   Other Aerobic Intervals sao2 93% 90 HR  2 min  on 1 min off for 4 cycles Baseline 99 % 90 BPM 1st cycle 93% sao2 91 HR 2nd cycle  sao2 96% HR 100 bpm ; Cycle 3 95% sao2 PR 95 bpm  4th trial 84% back to 92% with 15 second rest break 96 bpm      Knee/Hip Exercises: Supine   [place] Limitations x20     Other Supine Knee/Hip Exercises march x20      Shoulder Exercises: Seated   Other Seated Exercises bilateral er x24 yellow; horizontal abduction x20 yellow;  HR after 107 sao2 95%                    PT Education - 12/06/19 0934    Education Details updated HEP, reviewed interval training    Person(s) Educated Patient    Methods Explanation;Demonstration;Tactile cues;Verbal cues    Comprehension Verbalized understanding;Returned demonstration;Verbal cues required;Tactile cues required            PT Short Term Goals - 11/29/19 1331      PT SHORT TERM GOAL #1   Title Patient will increase 5/5 gross bilateral LE strength    Time 6    Period Weeks    Status New    Target Date 01/10/20      PT SHORT TERM GOAL #2   Title Patient will be independnet with basic HEP    Time 6    Period Weeks    Status New    Target Date 01/10/20      PT SHORT TERM GOAL #3   Title Patient will decrease 5x sit to stand test time to 12 seconds    Time 6    Period Weeks    Status New    Target Date 01/10/20             PT Long Term Goals - 11/29/19 1333      PT LONG TERM GOAL #1   Title Patientwill report no shortness of breath while walking 2000' on the 6 minute walk test in order to improve community ambualtion    Time 6    Period Weeks    Status New    Target Date 01/11/20      PT LONG TERM GOAL #2   Title Patient will go  up and down 6 steps without fatigue in order to improve  ability to walk in the community.    Time 6    Period Weeks    Status New    Target Date 01/11/20      PT LONG TERM GOAL #3   Title Patient will return to exercise program to improve general health and wellness    Time 6    Period Weeks    Status New    Target Date 01/11/20                 Plan - 12/06/19 0942    Clinical Impression Statement Patient's sao2 didpped with interval training but overall she tolerated treatment well. Darvin Neighbours will continue to increase intesnity of intervals. She was given updated HEP for UE exercises. Her Sao2 never dropped below 93% and her HR nevver went ablve 100 bpm. Over the next few bvisits we will work on elevating her HR to improve her cardiovasuclar endurance. Patients HR did ncrease with UE ther-ex to 107 max.    Comorbidities heart murmur, COVID 19; ulnar nerve transposition    Examination-Activity Limitations Bed Mobility;Carry;Lift;Locomotion Level;Squat    Examination-Participation Restrictions Cleaning;Community Activity;Driving;Shop    Stability/Clinical Decision Making Evolving/Moderate complexity    Clinical Decision Making Moderate    Rehab Potential Excellent    PT Frequency 1x / week    PT Duration 6 weeks    PT Treatment/Interventions ADLs/Self Care Home Management;Electrical Stimulation;Iontophoresis 4mg /ml Dexamethasone;Moist Heat;Ultrasound;Gait training;DME Instruction;Functional mobility training;Therapeutic activities;Therapeutic exercise;Neuromuscular re-education;Patient/family education;Manual techniques;Passive range of motion    PT Next Visit Plan consdier light intervals on the nu-step or bike. Progress as tolerated. Add in UEexercises; review HEP, monitor with treatment    PT Home Exercise Plan laq, hamstring curl, hip abduction    Consulted and Agree with Plan of Care Patient           Patient will benefit from skilled therapeutic intervention in order to improve the following deficits and impairments:   Difficulty walking, Decreased endurance, Decreased strength, Decreased activity tolerance  Visit Diagnosis: Other abnormalities of gait and mobility  Muscle weakness (generalized)     Problem List Patient Active Problem List   Diagnosis Date Noted   COVID-19 virus infection 10/19/2019   Mixed hyperlipidemia 10/19/2019   Class 2 severe obesity due to excess calories with serious comorbidity and body mass index (BMI) of 37.0 to 37.9 in adult ([place]) 10/19/2019   Acute respiratory failure with hypoxia ([place]) 10/19/2019   Left leg pain 10/04/2019   Elevated glucose 10/08/2018   Elevated LDL cholesterol level 10/08/2018   Healthcare maintenance 10/08/2018   Need for immunization against influenza 10/08/2018   Obesity (BMI 30-39.9) 03/29/2018   Acute chest pain 03/27/2018   Nonspecific abnormal electrocardiogram (ECG) (EKG) 03/27/2018   Atypical chest pain 03/27/2018   Hand pain, right 03/25/2018   Dyslipidemia 08/15/2017   History of hysterectomy, supracervical 02/14/2017   Trigger ring finger of right hand 08/07/2015   Primary osteoarthritis of first carpometacarpal joint of right hand 08/07/2015   Diverticulitis of sigmoid colon s/p colectomy 06/30/2014 07/26/2014   Tachycardia 02/04/2014   Hx of adenomatous polyp of colon 12/23/2013   Essential hypertension 11/02/2013   Former smoker 11/26/2008    Carney Living PT DPT  12/06/2019, 12:07 PM  [facility] [address] [place], [place], [address] Phone: [phone]   Fax:  [phone]  Name: [name] MRN: [id number] Date of Birth: March 11, 1970

## 2019-12-13 ENCOUNTER — Ambulatory Visit: Payer: 59 | Admitting: Physical Therapy

## 2019-12-13 ENCOUNTER — Encounter: Payer: Self-pay | Admitting: Physical Therapy

## 2019-12-13 ENCOUNTER — Other Ambulatory Visit: Payer: Self-pay

## 2019-12-13 VITALS — BP 129/69 | HR 88

## 2019-12-13 DIAGNOSIS — M6281 Muscle weakness (generalized): Secondary | ICD-10-CM

## 2019-12-13 DIAGNOSIS — R2689 Other abnormalities of gait and mobility: Secondary | ICD-10-CM

## 2019-12-13 NOTE — Therapy (Signed)
[place], [place], [address] Phone: [phone]   Fax:  [phone]  Physical Therapy Treatment  Patient Details  Name: [name] MRN: [id number] Date of Birth: 04/23/1970 Referring Provider (PT): Dr Abelino Derrick    Encounter Date: 12/13/2019   PT End of Session - 12/13/19 1023    Visit Number 3    Number of Visits 6    Date for PT Re-Evaluation 01/10/20    Authorization Type [employer]    PT Start Time 2458    PT Stop Time 1100    PT Time Calculation (min) 45 min           Past Medical History:  Diagnosis Date  . Abnormal Pap smear 2000   [facility] Biopsy  . Allergy   . Arthritis    hands  . Back pain   . Blood transfusion without reported diagnosis 2005  . Breast fibroadenoma   . Diverticulitis of colon with perforation 05/20/2014  . GERD (gastroesophageal reflux disease)   . H/O metrorrhagia 12/2006  . H/O myomectomy 07-2006   robotic  . H/O sinusitis   . Headache(784.0)   . Heart murmur   . History of conization of cervix 2000  . History of ectopic pregnancy 2005   r salpyngectomy  . History of hysterectomy, supracervical 08-2008   robotic  . History of syphilis   . History of syphilis   . Hx of adenomatous polyp of colon 12/31/2017  . Hx of herpes simplex type 2 infection   . Hyperlipidemia   . Hypertension   . Ileostomy present ([place]) 10/26/2014  . Intra-abdominal abscess ([place]) 07/21/2014  . PMS (premenstrual syndrome)     Past Surgical History:  Procedure Laterality Date  . ABDOMINAL HYSTERECTOMY    . BREAST LUMPECTOMY  10/11   rt-neg  . BREAST SURGERY  2004   fibroademoma  . CERVICAL CONE BIOPSY  2000  . COLON RESECTION N/A 07/06/2014   Procedure: LAPAROSCOPIC LOOP ILEOSTOMY WITH PLACEMENT OF PELVIC DRAIN;  Surgeon: Donnie Mesa, MD;  Location: [facility];  Service: General;  Laterality: N/A;  . COLON SURGERY     diverticulitis   . COLONOSCOPY    . DILATION AND  CURETTAGE OF UTERUS  2009  . DIVERTING ILEOSTOMY Right 06/2014  . ECTOPIC PREGNANCY SURGERY  2005   R salpyngectomy  . ILEOSTOMY N/A 10/26/2014   Procedure: LOOP ILEOSTOMY REVERSAL;  Surgeon: Donnie Mesa, MD;  Location: [facility];  Service: General;  Laterality: N/A;  . LAPAROSCOPIC SIGMOID COLECTOMY N/A 06/30/2014   Procedure: LAPAROSCOPIC ASSISTED SIGMOID COLECTOMY;  Surgeon: Donnie Mesa, MD;  Location: [facility];  Service: General;  Laterality: N/A;  . MYOMECTOMY  2008   robotic  . POLYPECTOMY  2009  . ROBOTIC ASSISTED LAP VAGINAL HYSTERECTOMY  2010   supracervical  . SALPINGECTOMY  2005  . TRIGGER FINGER RELEASE Right 09/28/2012   Procedure: RELEASE TRIGGER FINGER/A-1 PULLEY RIGHT THUMB;  Surgeon: Jolyn Nap, MD;  Location: [facility];  Service: Orthopedics;  Laterality: Right;  . ULNAR NERVE TRANSPOSITION Right 12/01/2018   Procedure: ULNAR NERVE DECOMPRESSION;  Surgeon: Daryll Brod, MD;  Location: [facility];  Service: Orthopedics;  Laterality: Right;  AXILLARY    Vitals:   12/13/19 1021  BP: 129/69  Pulse: 88  SpO2: 98%     Subjective Assessment - 12/13/19 1020    Subjective No pain. Only gets SOB with alot of activity like bringing  in the grocerries.    Currently in Pain? No/denies                             [facility] Adult PT Treatment/Exercise - 12/13/19 0001      Knee/Hip Exercises: Aerobic   Other Aerobic Intervals on Nustep L1 : 25min on 1 min off vitals 92 bpm and 95% Sp02; 2nd interval 4 min on ,1 min off , vitals : HR 92 bpm, SP02 96%; 3rd interval 5 min on 30 sec off vitals: HR 100 bpm, SPo2 95%; 4th interval 6 min on, 30 sec off , 5th interval Nustep L3 5 minutes on HR 105   SP02 96   approx 85- 95 SPM (speed), L1 for first 4 intervals.then L3      Knee/Hip Exercises: Standing   Other Standing Knee Exercises step ups x 10 each - SP02 94%  HR115 bpm , then 15 step ups each - HR 128 bpm  SPo2 93%   6 inch step , 1 minute  rest      Shoulder Exercises: Seated   Other Seated Exercises standing row and extension x 20 each -green   99Spo2, 114 Hr                    PT Short Term Goals - 11/29/19 1331      PT SHORT TERM GOAL #1   Title Patient will increase 5/5 gross bilateral LE strength    Time 6    Period Weeks    Status New    Target Date 01/10/20      PT SHORT TERM GOAL #2   Title Patient will be independnet with basic HEP    Time 6    Period Weeks    Status New    Target Date 01/10/20      PT SHORT TERM GOAL #3   Title Patient will decrease 5x sit to stand test time to 12 seconds    Time 6    Period Weeks    Status New    Target Date 01/10/20             PT Long Term Goals - 11/29/19 1333      PT LONG TERM GOAL #1   Title Patientwill report no shortness of breath while walking 2000' on the 6 minute walk test in order to improve community ambualtion    Time 6    Period Weeks    Status New    Target Date 01/11/20      PT LONG TERM GOAL #2   Title Patient will go up and down 6 steps without fatigue in order to improve ability to walk in the community.    Time 6    Period Weeks    Status New    Target Date 01/11/20      PT LONG TERM GOAL #3   Title Patient will return to exercise program to improve general health and wellness    Time 6    Period Weeks    Status New    Target Date 01/11/20                 Plan - 12/13/19 1157    Clinical Impression Statement Pt reports min compliance with HEP. Performed progressive intervals on Nustep increasing resistance to L3 with her HR and SPO2 at normal response level. Began standing UE exercises and step ups with some  SOB reported after second roud of step ups. SPO2 never dropped below 93% and HR at most was 128 bpm after step ups. Encouraged her to use her watch to monitor HR and try short distance walking if able while monitoring HR to keep below 128 bpm.    PT Next Visit Plan consdier light intervals on the nu-step  or bike. Progress as tolerated. Add in UEexercises; review HEP, monitor with treatment    PT Home Exercise Plan laq, hamstring curl, hip abduction           Patient will benefit from skilled therapeutic intervention in order to improve the following deficits and impairments:  Difficulty walking, Decreased endurance, Decreased strength, Decreased activity tolerance  Visit Diagnosis: Other abnormalities of gait and mobility  Muscle weakness (generalized)     Problem List Patient Active Problem List   Diagnosis Date Noted  . COVID-19 virus infection 10/19/2019  . Mixed hyperlipidemia 10/19/2019  . Class 2 severe obesity due to excess calories with serious comorbidity and body mass index (BMI) of 37.0 to 37.9 in adult ([place]) 10/19/2019  . Acute respiratory failure with hypoxia ([place]) 10/19/2019  . Left leg pain 10/04/2019  . Elevated glucose 10/08/2018  . Elevated LDL cholesterol level 10/08/2018  . Healthcare maintenance 10/08/2018  . Need for immunization against influenza 10/08/2018  . Obesity (BMI 30-39.9) 03/29/2018  . Acute chest pain 03/27/2018  . Nonspecific abnormal electrocardiogram (ECG) (EKG) 03/27/2018  . Atypical chest pain 03/27/2018  . Hand pain, right 03/25/2018  . Dyslipidemia 08/15/2017  . History of hysterectomy, supracervical 02/14/2017  . Trigger ring finger of right hand 08/07/2015  . Primary osteoarthritis of first carpometacarpal joint of right hand 08/07/2015  . Diverticulitis of sigmoid colon s/p colectomy 06/30/2014 07/26/2014  . Tachycardia 02/04/2014  . Hx of adenomatous polyp of colon 12/23/2013  . Essential hypertension 11/02/2013  . Former smoker 11/26/2008    Dorene Ar , PTA 12/13/2019, 12:03 PM  [facility] [address] [place], [place], [address] Phone: [phone]   Fax:  [phone]  Name: [name] MRN: [id number] Date of Birth: Aug 28, 1970

## 2019-12-20 ENCOUNTER — Other Ambulatory Visit: Payer: Self-pay

## 2019-12-20 ENCOUNTER — Ambulatory Visit: Payer: 59 | Attending: Family Medicine

## 2019-12-20 DIAGNOSIS — R2689 Other abnormalities of gait and mobility: Secondary | ICD-10-CM | POA: Insufficient documentation

## 2019-12-20 DIAGNOSIS — M6281 Muscle weakness (generalized): Secondary | ICD-10-CM | POA: Insufficient documentation

## 2019-12-20 NOTE — Therapy (Signed)
[place], [place], [address] Phone: [phone]   Fax:  [phone]  Physical Therapy Treatment  Patient Details  Name: [name] MRN: [id number] Date of Birth: [birth year] Referring Provider (PT): Dr Abelino Derrick    Encounter Date: 12/20/2019   PT End of Session - 12/20/19 0838    Visit Number 4    Number of Visits 6    Date for PT Re-Evaluation 01/10/20    Authorization Type [employer]    PT Start Time [phone]   pt arrived late   PT Stop Time 0914    PT Time Calculation (min) 39 min    Activity Tolerance Patient tolerated treatment well    Behavior During Therapy [facility] for tasks assessed/performed           Past Medical History:  Diagnosis Date   Abnormal Pap smear 2000   [facility] Biopsy   Allergy    Arthritis    hands   Back pain    Blood transfusion without reported diagnosis 2005   Breast fibroadenoma    Diverticulitis of colon with perforation 05/20/2014   GERD (gastroesophageal reflux disease)    H/O metrorrhagia 12/2006   H/O myomectomy 07-2006   robotic   H/O sinusitis    Headache(784.0)    Heart murmur    History of conization of cervix 2000   History of ectopic pregnancy 2005   r salpyngectomy   History of hysterectomy, supracervical 08-2008   robotic   History of syphilis    History of syphilis    Hx of adenomatous polyp of colon 12/31/2017   Hx of herpes simplex type 2 infection    Hyperlipidemia    Hypertension    Ileostomy present ([place]) 10/26/2014   Intra-abdominal abscess ([place]) 07/21/2014   PMS (premenstrual syndrome)     Past Surgical History:  Procedure Laterality Date   ABDOMINAL HYSTERECTOMY     BREAST LUMPECTOMY  10/11   rt-neg   BREAST SURGERY  2004   fibroademoma   CERVICAL CONE BIOPSY  2000   COLON RESECTION N/A 07/06/2014   Procedure: LAPAROSCOPIC LOOP ILEOSTOMY WITH PLACEMENT OF PELVIC DRAIN;  Surgeon: Donnie Mesa, MD;   Location: [facility];  Service: General;  Laterality: N/A;   COLON SURGERY     diverticulitis    COLONOSCOPY     DILATION AND CURETTAGE OF UTERUS  2009   DIVERTING ILEOSTOMY Right 06/2014   ECTOPIC PREGNANCY SURGERY  2005   R salpyngectomy   ILEOSTOMY N/A 10/26/2014   Procedure: LOOP ILEOSTOMY REVERSAL;  Surgeon: Donnie Mesa, MD;  Location: [facility];  Service: General;  Laterality: N/A;   LAPAROSCOPIC SIGMOID COLECTOMY N/A 06/30/2014   Procedure: LAPAROSCOPIC ASSISTED SIGMOID COLECTOMY;  Surgeon: Donnie Mesa, MD;  Location: [facility];  Service: General;  Laterality: N/A;   MYOMECTOMY  2008   robotic   POLYPECTOMY  2009   ROBOTIC ASSISTED LAP VAGINAL HYSTERECTOMY  2010   supracervical   SALPINGECTOMY  2005   TRIGGER FINGER RELEASE Right 09/28/2012   Procedure: RELEASE TRIGGER FINGER/A-1 PULLEY RIGHT THUMB;  Surgeon: Jolyn Nap, MD;  Location: [facility];  Service: Orthopedics;  Laterality: Right;   ULNAR NERVE TRANSPOSITION Right 12/01/2018   Procedure: ULNAR NERVE DECOMPRESSION;  Surgeon: Daryll Brod, MD;  Location: [facility];  Service: Orthopedics;  Laterality: Right;  AXILLARY    There were no vitals filed for this visit.       [place]  PT Assessment - 12/20/19 0001      Assessment   Medical Diagnosis Deconditioning     Referring Provider (PT) Dr Abelino Derrick                          [facility] Adult PT Treatment/Exercise - 12/20/19 0001      Self-Care   Self-Care Other Self-Care Comments    Other Self-Care Comments  HEP, reviewed interval training and deep breathing exercises; discussed using pulse oximeter at home to monitor SaO2 during exercises/activity      Knee/Hip Exercises: Aerobic   Other Aerobic Intervals on recumbent bike L4 - 1.5 min on and 1.5 off; 2 min on and 1 min off SaO2 98%; 1.5 min on and 2 min off SaO2 95% and HR 113 bpm (rest break more for fatigue in BLE than SOB); last interval on L2 for 5 min SaO2 96%       Knee/Hip Exercises: Standing   Other Standing Knee Exercises Step ups on 8" step 15x leading with RLE ascending SaO2 95% 124bpm HR; 15x leading with LLE SaO2 93% and 125bpm HR; SaO2 at 96% after 2 min rest break    Other Standing Knee Exercises Standing marches on BOSU 2 min; SaO2 96% and 131 bpm HR      Shoulder Exercises: Seated   Extension Strengthening;Both;Theraband;Other (comment)   30 reps   Theraband Level (Shoulder Extension) Level 3 (Green)    Extension Limitations SaO2 96%; 118bpm HR    Row Strengthening;Both;Theraband   30 reps   Theraband Level (Shoulder Row) Level 3 (Green)    Row Limitations SaO2 96%; 116 bpm HR    Other Seated Exercises *Shoulder exercises performed in standing    Other Seated Exercises Pallof press with rotation using 1 green theraband 15x each direction; cues for abdominal activation; SaO2 97% and 118bpm HR                  PT Education - 12/20/19 0848    Education Details HEP, reviewed interval training and deep breathing exercises; discussed use of pulse oximeter during exercises/activity at home to monitor SaO2    Person(s) Educated Patient    Methods Explanation;Demonstration;Tactile cues;Verbal cues    Comprehension Verbalized understanding;Returned demonstration;Verbal cues required;Tactile cues required            PT Short Term Goals - 11/29/19 1331      PT SHORT TERM GOAL #1   Title Patient will increase 5/5 gross bilateral LE strength    Time 6    Period Weeks    Status New    Target Date 01/10/20      PT SHORT TERM GOAL #2   Title Patient will be independnet with basic HEP    Time 6    Period Weeks    Status New    Target Date 01/10/20      PT SHORT TERM GOAL #3   Title Patient will decrease 5x sit to stand test time to 12 seconds    Time 6    Period Weeks    Status New    Target Date 01/10/20             PT Long Term Goals - 11/29/19 1333      PT LONG TERM GOAL #1   Title Patientwill report no  shortness of breath while walking 2000' on the 6 minute walk test in order to improve community ambualtion  Time 6    Period Weeks    Status New    Target Date 01/11/20      PT LONG TERM GOAL #2   Title Patient will go up and down 6 steps without fatigue in order to improve ability to walk in the community.    Time 6    Period Weeks    Status New    Target Date 01/11/20      PT LONG TERM GOAL #3   Title Patient will return to exercise program to improve general health and wellness    Time 6    Period Weeks    Status New    Target Date 01/11/20                 Plan - 12/20/19 0849    Clinical Impression Statement Pt tolerated intervals on recumbent bike well with more fatigue in BLE than difficulty due to SOB. Pt's SaO2 did not reach below 93% throughout treatment session. Pt requires frequent rest breaks to catch her breath and due to fatigue but was able to perform longer intervals and tolerate progressions (8" step vs 6" step for step ups and increased reps for UE exercises).    Personal Factors and Comorbidities Comorbidity 1;Comorbidity 2;Comorbidity 3+    Comorbidities heart murmur, COVID 19; ulnar nerve transposition    Examination-Activity Limitations Bed Mobility;Carry;Lift;Locomotion Level;Squat    PT Treatment/Interventions ADLs/Self Care Home Management;Electrical Stimulation;Iontophoresis 4mg /ml Dexamethasone;Moist Heat;Ultrasound;Gait training;DME Instruction;Functional mobility training;Therapeutic activities;Therapeutic exercise;Neuromuscular re-education;Patient/family education;Manual techniques;Passive range of motion    PT Next Visit Plan Continue progression of light intervals on the nu-step or bike to pt tolerance. Continue UE and core exercises; review HEP, monitor with treatment    PT Home Exercise Plan laq, hamstring curl, hip abduction    Consulted and Agree with Plan of Care Patient           Patient will benefit from skilled therapeutic  intervention in order to improve the following deficits and impairments:  Difficulty walking, Decreased endurance, Decreased strength, Decreased activity tolerance  Visit Diagnosis: Other abnormalities of gait and mobility  Muscle weakness (generalized)     Problem List Patient Active Problem List   Diagnosis Date Noted   COVID-19 virus infection 10/19/2019   Mixed hyperlipidemia 10/19/2019   Class 2 severe obesity due to excess calories with serious comorbidity and body mass index (BMI) of 37.0 to 37.9 in adult ([place]) 10/19/2019   Acute respiratory failure with hypoxia ([place]) 10/19/2019   Left leg pain 10/04/2019   Elevated glucose 10/08/2018   Elevated LDL cholesterol level 10/08/2018   Healthcare maintenance 10/08/2018   Need for immunization against influenza 10/08/2018   Obesity (BMI 30-39.9) 03/29/2018   Acute chest pain 03/27/2018   Nonspecific abnormal electrocardiogram (ECG) (EKG) 03/27/2018   Atypical chest pain 03/27/2018   Hand pain, right 03/25/2018   Dyslipidemia 08/15/2017   History of hysterectomy, supracervical 02/14/2017   Trigger ring finger of right hand 08/07/2015   Primary osteoarthritis of first carpometacarpal joint of right hand 08/07/2015   Diverticulitis of sigmoid colon s/p colectomy 06/30/2014 07/26/2014   Tachycardia 02/04/2014   Hx of adenomatous polyp of colon 12/23/2013   Essential hypertension 11/02/2013   Former smoker 11/26/2008    Haydee Monica, PT, DPT 12/20/19 12:05 PM  [facility] [address] [place], [place], [address] Phone: [phone]   Fax:  [phone]  Name: [name] MRN: [id number] Date of Birth: Apr 17, 1970

## 2019-12-27 ENCOUNTER — Encounter: Payer: 59 | Admitting: Physical Therapy

## 2020-01-03 ENCOUNTER — Other Ambulatory Visit: Payer: Self-pay

## 2020-01-03 ENCOUNTER — Ambulatory Visit: Payer: 59

## 2020-01-03 DIAGNOSIS — R2689 Other abnormalities of gait and mobility: Secondary | ICD-10-CM | POA: Diagnosis not present

## 2020-01-03 DIAGNOSIS — M6281 Muscle weakness (generalized): Secondary | ICD-10-CM

## 2020-01-03 NOTE — Therapy (Addendum)
[place], [place], [address] Phone: [phone]   Fax:  [phone]  Physical Therapy Treatment/Discharge Summary  Patient Details  Name: [name] MRN: [id number] Date of Birth: 04-30-1970 Referring Provider (PT): Dr Abelino Derrick    Encounter Date: 01/03/2020   PT End of Session - 01/03/20 1129    Visit Number 5    Number of Visits 6    Date for PT Re-Evaluation 01/10/20    Authorization Type [employer]    PT Start Time 1130    PT Stop Time 1209    PT Time Calculation (min) 39 min    Activity Tolerance Patient tolerated treatment well    Behavior During Therapy [facility] for tasks assessed/performed           Past Medical History:  Diagnosis Date  . Abnormal Pap smear 2000   [facility] Biopsy  . Allergy   . Arthritis    hands  . Back pain   . Blood transfusion without reported diagnosis 2005  . Breast fibroadenoma   . Diverticulitis of colon with perforation 05/20/2014  . GERD (gastroesophageal reflux disease)   . H/O metrorrhagia 12/2006  . H/O myomectomy 07-2006   robotic  . H/O sinusitis   . Headache(784.0)   . Heart murmur   . History of conization of cervix 2000  . History of ectopic pregnancy 2005   r salpyngectomy  . History of hysterectomy, supracervical 08-2008   robotic  . History of syphilis   . History of syphilis   . Hx of adenomatous polyp of colon 12/31/2017  . Hx of herpes simplex type 2 infection   . Hyperlipidemia   . Hypertension   . Ileostomy present ([place]) 10/26/2014  . Intra-abdominal abscess ([place]) 07/21/2014  . PMS (premenstrual syndrome)     Past Surgical History:  Procedure Laterality Date  . ABDOMINAL HYSTERECTOMY    . BREAST LUMPECTOMY  10/11   rt-neg  . BREAST SURGERY  2004   fibroademoma  . CERVICAL CONE BIOPSY  2000  . COLON RESECTION N/A 07/06/2014   Procedure: LAPAROSCOPIC LOOP ILEOSTOMY WITH PLACEMENT OF PELVIC DRAIN;  Surgeon: Donnie Mesa,  MD;  Location: [place];  Service: General;  Laterality: N/A;  . COLON SURGERY     diverticulitis   . COLONOSCOPY    . DILATION AND CURETTAGE OF UTERUS  2009  . DIVERTING ILEOSTOMY Right 06/2014  . ECTOPIC PREGNANCY SURGERY  2005   R salpyngectomy  . ILEOSTOMY N/A 10/26/2014   Procedure: LOOP ILEOSTOMY REVERSAL;  Surgeon: Donnie Mesa, MD;  Location: [place];  Service: General;  Laterality: N/A;  . LAPAROSCOPIC SIGMOID COLECTOMY N/A 06/30/2014   Procedure: LAPAROSCOPIC ASSISTED SIGMOID COLECTOMY;  Surgeon: Donnie Mesa, MD;  Location: [place];  Service: General;  Laterality: N/A;  . MYOMECTOMY  2008   robotic  . POLYPECTOMY  2009  . ROBOTIC ASSISTED LAP VAGINAL HYSTERECTOMY  2010   supracervical  . SALPINGECTOMY  2005  . TRIGGER FINGER RELEASE Right 09/28/2012   Procedure: RELEASE TRIGGER FINGER/A-1 PULLEY RIGHT THUMB;  Surgeon: Jolyn Nap, MD;  Location: [place];  Service: Orthopedics;  Laterality: Right;  . ULNAR NERVE TRANSPOSITION Right 12/01/2018   Procedure: ULNAR NERVE DECOMPRESSION;  Surgeon: Daryll Brod, MD;  Location: [place];  Service: Orthopedics;  Laterality: Right;  AXILLARY    There were no vitals filed for this visit.   Subjective Assessment - 01/03/20 1129  Subjective Patient states she got winded while walking into clinic today but some days are better than others.    Pertinent History Low Back pain; ulnar nerve decompression 2020; herat murmor,    How long can you sit comfortably? no limit    How long can you stand comfortably? can stand for a while but can depend on what she is doing    How long can you walk comfortably? gets tired walking around for too long    Diagnostic tests Lung X-ray:    Patient Stated Goals to improve endurance with fdaily activity    Currently in Pain? No/denies              [facility] PT Assessment - 01/03/20 0001      Assessment   Medical Diagnosis Deconditioning     Referring Provider (PT) Dr  Abelino Derrick                          [facility] Adult PT Treatment/Exercise - 01/03/20 0001      Self-Care   Self-Care Other Self-Care Comments    Other Self-Care Comments  Reviewed HEP and how to access HEP via medbridge go app, discussed re-evaluation next session and potential D/C vs continued POC      Knee/Hip Exercises: Aerobic   Tread Mill Speed 1.2, grade 1; 2.5 min - SaO2 97% and HR 118 bpm; 3 min on and 1 min off SaO2 98% and HR 125 bpm    Other Aerobic Intervals on recumbent bike L5 1 min on and 1 min off SaO2 95% and HR 96 bpm; L3 1.5 min on and 1.5 min off SaO2 96% and HR 90bpm (delayed reading from pulse ox); L3 1.5 min on and 1 min off SaO2 98% and HR 108 bpm      Knee/Hip Exercises: Standing   Forward Step Up Both;15 reps;Step Height: 8"   15x each LE   Forward Step Up Limitations with opposite knee drive; SaO2 97% and HR 137 bpm    Functional Squat 20 reps    Functional Squat Limitations on BOSU (black side); mini squats    Other Standing Knee Exercises Standing marches on BOSU (blue) 15x each LE; SaO2 97% and HR 129 bpm      Knee/Hip Exercises: Seated   Marching Strengthening;Both;20 reps    Marching Limitations Sitting on swiss ball; SaO2 97% and HR 121 bpm    Sit to Sand 20 reps;without UE support   holding blue weighted ball; SaO2 97% and HR 128 bpm     Shoulder Exercises: Seated   Extension --      Shoulder Exercises: Standing   Extension Strengthening;Both;20 reps;Theraband    Theraband Level (Shoulder Extension) Level 4 (Blue)    Extension Limitations SaO2 99% and HR 119 bpm    Row Strengthening;Both;20 reps;Theraband    Theraband Level (Shoulder Row) Level 4 (Blue)    Row Limitations SaO2 98% and HR 120 bpm    Other Standing Exercises Pallof press with rotation using 1 blue theraband 10x each direction; SaO2 97% and HR 124 bpm                  PT Education - 01/03/20 1217    Education Details Reviewed HEP and how to access HEP via  medbridge go app, discussed re-evaluation next session and potential D/C vs continued POC    Person(s) Educated Patient    Methods Explanation;Demonstration;Tactile cues;Verbal cues  Comprehension Verbalized understanding;Returned demonstration;Verbal cues required;Tactile cues required            PT Short Term Goals - 11/29/19 1331      PT SHORT TERM GOAL #1   Title Patient will increase 5/5 gross bilateral LE strength    Time 6    Period Weeks    Status New    Target Date 01/10/20      PT SHORT TERM GOAL #2   Title Patient will be independnet with basic HEP    Time 6    Period Weeks    Status New    Target Date 01/10/20      PT SHORT TERM GOAL #3   Title Patient will decrease 5x sit to stand test time to 12 seconds    Time 6    Period Weeks    Status New    Target Date 01/10/20             PT Long Term Goals - 11/29/19 1333      PT LONG TERM GOAL #1   Title Patientwill report no shortness of breath while walking 2000' on the 6 minute walk test in order to improve community ambualtion    Time 6    Period Weeks    Status New    Target Date 01/11/20      PT LONG TERM GOAL #2   Title Patient will go up and down 6 steps without fatigue in order to improve ability to walk in the community.    Time 6    Period Weeks    Status New    Target Date 01/11/20      PT LONG TERM GOAL #3   Title Patient will return to exercise program to improve general health and wellness    Time 6    Period Weeks    Status New    Target Date 01/11/20                 Plan - 01/03/20 1129    Clinical Impression Statement Pt demonstrated some SOB and fatigue with interventions but SaO2 remained =/> 97%. Pt did not require as many/long rest breaks between exercises today compared to previous session but still required time to catch her breath during intervals on recumbent bike and treadmill.    Personal Factors and Comorbidities Comorbidity 1;Comorbidity 2;Comorbidity 3+     Comorbidities heart murmur, COVID 19; ulnar nerve transposition    Examination-Activity Limitations Bed Mobility;Carry;Lift;Locomotion Level;Squat    Rehab Potential Excellent    PT Treatment/Interventions ADLs/Self Care Home Management;Electrical Stimulation;Iontophoresis 32m/ml Dexamethasone;Moist Heat;Ultrasound;Gait training;DME Instruction;Functional mobility training;Therapeutic activities;Therapeutic exercise;Neuromuscular re-education;Patient/family education;Manual techniques;Passive range of motion    PT Next Visit Plan Re-evaluation and FOTO. Reassess 6MWT and 5xSTS. Review goals. Continue progression of light intervals on the nu-step or bike to pt tolerance. Continue UE and core exercises; review HEP, monitor with treatment    PT Home Exercise Plan laq, hamstring curl, hip abduction    Consulted and Agree with Plan of Care Patient           Patient will benefit from skilled therapeutic intervention in order to improve the following deficits and impairments:  Difficulty walking, Decreased endurance, Decreased strength, Decreased activity tolerance  Visit Diagnosis: Other abnormalities of gait and mobility  Muscle weakness (generalized)     Problem List Patient Active Problem List   Diagnosis Date Noted  . COVID-19 virus infection 10/19/2019  . Mixed hyperlipidemia 10/19/2019  . Class  2 severe obesity due to excess calories with serious comorbidity and body mass index (BMI) of 37.0 to 37.9 in adult [facility]) 10/19/2019  . Acute respiratory failure with hypoxia ([place]) 10/19/2019  . Left leg pain 10/04/2019  . Elevated glucose 10/08/2018  . Elevated LDL cholesterol level 10/08/2018  . Healthcare maintenance 10/08/2018  . Need for immunization against influenza 10/08/2018  . Obesity (BMI 30-39.9) 03/29/2018  . Acute chest pain 03/27/2018  . Nonspecific abnormal electrocardiogram (ECG) (EKG) 03/27/2018  . Atypical chest pain 03/27/2018  . Hand pain, right 03/25/2018  .  Dyslipidemia 08/15/2017  . History of hysterectomy, supracervical 02/14/2017  . Trigger ring finger of right hand 08/07/2015  . Primary osteoarthritis of first carpometacarpal joint of right hand 08/07/2015  . Diverticulitis of sigmoid colon s/p colectomy 06/30/2014 07/26/2014  . Tachycardia 02/04/2014  . Hx of adenomatous polyp of colon 12/23/2013  . Essential hypertension 11/02/2013  . Former smoker 11/26/2008    PHYSICAL THERAPY DISCHARGE SUMMARY  Visits from Start of Care: 5  Current functional level related to goals / functional outcomes: See above   Remaining deficits: See above   Education / Equipment: See above  Plan: Patient agrees to discharge.  Patient goals were not met. Patient is being discharged due to not returning since the last visit.  ?????        Haydee Monica, PT, DPT 04/04/20 2:31 PM  [facility] [address] [place], [place], [address] Phone: [phone]   Fax:  [phone]  Name: [name] MRN: [id number] Date of Birth: [birth year]

## 2020-01-14 ENCOUNTER — Other Ambulatory Visit: Payer: Self-pay | Admitting: Family Medicine

## 2020-01-14 ENCOUNTER — Other Ambulatory Visit: Payer: Self-pay

## 2020-01-14 DIAGNOSIS — I208 Other forms of angina pectoris: Secondary | ICD-10-CM

## 2020-01-14 DIAGNOSIS — I10 Essential (primary) hypertension: Secondary | ICD-10-CM

## 2020-01-17 MED ORDER — ATORVASTATIN CALCIUM 20 MG PO TABS
20.0000 mg | ORAL_TABLET | Freq: Every day | ORAL | 0 refills | Status: DC
Start: 2020-01-17 — End: 2020-04-12

## 2020-01-17 MED ORDER — AMLODIPINE BESYLATE 5 MG PO TABS: 5.0000 mg | ORAL_TABLET | Freq: Every day | ORAL | 0 refills | Status: DC

## 2020-01-17 MED ORDER — LISINOPRIL 10 MG PO TABS: 10.0000 mg | ORAL_TABLET | Freq: Every day | ORAL | 0 refills | Status: DC

## 2020-01-17 MED ORDER — NITROGLYCERIN 0.4 MG SL SUBL: 0.4000 mg | SUBLINGUAL_TABLET | SUBLINGUAL | 1 refills | Status: DC | PRN

## 2020-01-24 ENCOUNTER — Ambulatory Visit: Payer: 59

## 2020-02-14 ENCOUNTER — Ambulatory Visit: Payer: 59 | Attending: Family Medicine

## 2020-02-14 ENCOUNTER — Telehealth: Payer: Self-pay

## 2020-02-14 NOTE — Telephone Encounter (Signed)
PT called and spoke with patient regarding "no show" today and provided education regarding attendance policy. Patient was apologetic and reports that she forgot about her appointment today but would like to reschedule. PT informed patient that today's appointment was going to be a re-evaluation to reassess objective measurements and discuss POC since it is past her re-evaluation date and has been several weeks since she has been in PT. Patient explains that she has been doing well but would like to call back to reschedule her appointment.  Haydee Monica, PT, DPT 02/14/20 2:21 PM

## 2020-02-19 HISTORY — PX: WISDOM TOOTH EXTRACTION: SHX21

## 2020-03-24 ENCOUNTER — Telehealth: Payer: Self-pay | Admitting: Family Medicine

## 2020-03-24 NOTE — Telephone Encounter (Signed)
Pt wants to know if she can transfer care to [facility], Is this okay?

## 2020-03-24 NOTE — Telephone Encounter (Signed)
Due to high volume of new patients, I am unable to accept transfer care patients.

## 2020-03-27 NOTE — Telephone Encounter (Signed)
Pt said they are just going to find a different provider 

## 2020-04-05 ENCOUNTER — Ambulatory Visit: Payer: 59 | Admitting: Family Medicine

## 2020-04-12 ENCOUNTER — Other Ambulatory Visit: Payer: Self-pay

## 2020-04-12 ENCOUNTER — Other Ambulatory Visit: Payer: Self-pay | Admitting: Family Medicine

## 2020-04-12 DIAGNOSIS — I10 Essential (primary) hypertension: Secondary | ICD-10-CM

## 2020-04-12 DIAGNOSIS — I208 Other forms of angina pectoris: Secondary | ICD-10-CM

## 2020-04-12 MED ORDER — AMLODIPINE BESYLATE 5 MG PO TABS: 5.0000 mg | ORAL_TABLET | Freq: Every day | ORAL | 0 refills | Status: DC

## 2020-04-12 MED ORDER — ATORVASTATIN CALCIUM 20 MG PO TABS
20.0000 mg | ORAL_TABLET | Freq: Every day | ORAL | 0 refills | Status: DC
Start: 2020-04-12 — End: 2020-05-22

## 2020-04-12 MED ORDER — LISINOPRIL 10 MG PO TABS: 10.0000 mg | ORAL_TABLET | Freq: Every day | ORAL | 0 refills | Status: DC

## 2020-05-04 ENCOUNTER — Encounter (HOSPITAL_COMMUNITY): Payer: Self-pay

## 2020-05-04 ENCOUNTER — Other Ambulatory Visit: Payer: Self-pay

## 2020-05-04 ENCOUNTER — Emergency Department (HOSPITAL_COMMUNITY): Payer: 59

## 2020-05-04 ENCOUNTER — Emergency Department (HOSPITAL_COMMUNITY)
Admission: EM | Admit: 2020-05-04 | Discharge: 2020-05-05 | Disposition: A | Discharge status: Home / Self Care | Payer: 59 | Attending: Emergency Medicine | Admitting: Emergency Medicine

## 2020-05-04 DIAGNOSIS — R1032 Left lower quadrant pain: Secondary | ICD-10-CM | POA: Insufficient documentation

## 2020-05-04 DIAGNOSIS — K219 Gastro-esophageal reflux disease without esophagitis: Secondary | ICD-10-CM | POA: Diagnosis not present

## 2020-05-04 DIAGNOSIS — R6883 Chills (without fever): Secondary | ICD-10-CM | POA: Insufficient documentation

## 2020-05-04 DIAGNOSIS — Z79899 Other long term (current) drug therapy: Secondary | ICD-10-CM | POA: Diagnosis not present

## 2020-05-04 DIAGNOSIS — K5792 Diverticulitis of intestine, part unspecified, without perforation or abscess without bleeding: Secondary | ICD-10-CM

## 2020-05-04 DIAGNOSIS — Z87891 Personal history of nicotine dependence: Secondary | ICD-10-CM | POA: Diagnosis not present

## 2020-05-04 DIAGNOSIS — R110 Nausea: Secondary | ICD-10-CM | POA: Diagnosis not present

## 2020-05-04 DIAGNOSIS — Z8616 Personal history of COVID-19: Secondary | ICD-10-CM | POA: Insufficient documentation

## 2020-05-04 DIAGNOSIS — I10 Essential (primary) hypertension: Secondary | ICD-10-CM | POA: Diagnosis not present

## 2020-05-04 LAB — COMPREHENSIVE METABOLIC PANEL
ALT: 26 U/L (ref 0–44)
AST: 24 U/L (ref 15–41)
Albumin: 4.2 g/dL (ref 3.5–5.0)
Alkaline Phosphatase: 60 U/L (ref 38–126)
Anion gap: 8 (ref 5–15)
BUN: 5 mg/dL — ABNORMAL LOW (ref 6–20)
CO2: 28 mmol/L (ref 22–32)
Calcium: 9.6 mg/dL (ref 8.9–10.3)
Chloride: 103 mmol/L (ref 98–111)
Creatinine, Ser: 0.95 mg/dL (ref 0.44–1.00)
GFR, Estimated: >60 mL/min (ref >60)
Glucose, Bld: 117 mg/dL — ABNORMAL HIGH (ref 70–99)
Potassium: 4 mmol/L (ref 3.5–5.1)
Sodium: 139 mmol/L (ref 135–145)
Total Bilirubin: 0.9 mg/dL (ref 0.3–1.2)
Total Protein: 7 g/dL (ref 6.5–8.1)

## 2020-05-04 LAB — CBC
HCT: 41.3 % (ref 36.0–46.0)
Hemoglobin: 13.5 g/dL (ref 12.0–15.0)
MCH: 29.9 pg (ref 26.0–34.0)
MCHC: 32.7 g/dL (ref 30.0–36.0)
MCV: 91.4 fL (ref 80.0–100.0)
Platelets: 297 10*3/uL (ref 150–400)
RBC: 4.52 MIL/uL (ref 3.87–5.11)
RDW: 13 % (ref 11.5–15.5)
WBC: 8.1 10*3/uL (ref 4.0–10.5)
nRBC: 0 % (ref 0.0–0.2)

## 2020-05-04 LAB — LIPASE, BLOOD: Lipase: 37 U/L (ref 11–51)

## 2020-05-04 MED ORDER — DOCUSATE SODIUM 100 MG PO CAPS
100.0000 mg | ORAL_CAPSULE | Freq: Once | ORAL | Status: AC
  Administered 2020-05-05: 100 mg via ORAL
  Filled 2020-05-04: qty 1

## 2020-05-04 MED ORDER — HYDROMORPHONE HCL 1 MG/ML IJ SOLN
1.0000 mg | Freq: Once | INTRAMUSCULAR | Status: AC
  Administered 2020-05-04: 1 mg via INTRAVENOUS
  Filled 2020-05-04: qty 1

## 2020-05-04 MED ORDER — IOHEXOL 300 MG/ML  SOLN
100.0000 mL | Freq: Once | INTRAMUSCULAR | Status: AC | PRN
  Administered 2020-05-04: 100 mL via INTRAVENOUS

## 2020-05-04 MED ORDER — METRONIDAZOLE IN NACL 5-0.79 MG/ML-% IV SOLN
500.0000 mg | Freq: Once | INTRAVENOUS | Status: AC
  Administered 2020-05-05: 500 mg via INTRAVENOUS
  Filled 2020-05-04: qty 100

## 2020-05-04 MED ORDER — ONDANSETRON HCL 4 MG/2ML IJ SOLN
4.0000 mg | Freq: Once | INTRAMUSCULAR | Status: AC
  Administered 2020-05-04: 4 mg via INTRAVENOUS
  Filled 2020-05-04: qty 2

## 2020-05-04 MED ORDER — CIPROFLOXACIN IN D5W 400 MG/200ML IV SOLN
400.0000 mg | Freq: Once | INTRAVENOUS | Status: AC
  Administered 2020-05-05: 400 mg via INTRAVENOUS
  Filled 2020-05-04: qty 200

## 2020-05-04 MED ORDER — OXYCODONE-ACETAMINOPHEN 5-325 MG PO TABS
1.0000 | ORAL_TABLET | Freq: Once | ORAL | Status: AC
Start: 2020-05-05 — End: 2020-05-05
  Administered 2020-05-05: 1 via ORAL
  Filled 2020-05-04: qty 1

## 2020-05-04 NOTE — ED Provider Notes (Signed)
[facility] EMERGENCY DEPARTMENT Provider Note   CSN: [id number] Arrival date & time: 05/04/20  1447     History Chief Complaint  Patient presents with  . Abdominal Pain    [name] is a 50 y.o. female.  The history is provided by the patient and medical records. No language interpreter was used.  Abdominal Pain Pain location:  LLQ Pain quality: cramping and pressure   Pain radiates to:  Does not radiate Pain severity:  Moderate Onset quality:  Gradual Duration:  3 days Timing:  Constant Progression:  Waxing and waning Chronicity:  Recurrent Context: previous surgery   Context: not trauma   Relieved by:  Nothing Worsened by:  Nothing Ineffective treatments:  None tried Associated symptoms: chills and nausea   Associated symptoms: no chest pain, no constipation, no cough, no dysuria, no fatigue, no fever, no hematuria, no shortness of breath and no vomiting        Past Medical History:  Diagnosis Date  . Abnormal Pap smear 2000   [facility] Biopsy  . Allergy   . Arthritis    hands  . Back pain   . Blood transfusion without reported diagnosis 2005  . Breast fibroadenoma   . Diverticulitis of colon with perforation 05/20/2014  . GERD (gastroesophageal reflux disease)   . H/O metrorrhagia 12/2006  . H/O myomectomy 07-2006   robotic  . H/O sinusitis   . Headache(784.0)   . Heart murmur   . History of conization of cervix 2000  . History of ectopic pregnancy 2005   r salpyngectomy  . History of hysterectomy, supracervical 08-2008   robotic  . History of syphilis   . History of syphilis   . Hx of adenomatous polyp of colon 12/31/2017  . Hx of herpes simplex type 2 infection   . Hyperlipidemia   . Hypertension   . Ileostomy present ([place]) 10/26/2014  . Intra-abdominal abscess ([place]) 07/21/2014  . PMS (premenstrual syndrome)     Patient Active Problem List   Diagnosis Date Noted  . COVID-19 virus infection 10/19/2019  . Mixed  hyperlipidemia 10/19/2019  . Class 2 severe obesity due to excess calories with serious comorbidity and body mass index (BMI) of 37.0 to 37.9 in adult ([place]) 10/19/2019  . Acute respiratory failure with hypoxia ([place]) 10/19/2019  . Left leg pain 10/04/2019  . Elevated glucose 10/08/2018  . Elevated LDL cholesterol level 10/08/2018  . Healthcare maintenance 10/08/2018  . Need for immunization against influenza 10/08/2018  . Obesity (BMI 30-39.9) 03/29/2018  . Acute chest pain 03/27/2018  . Nonspecific abnormal electrocardiogram (ECG) (EKG) 03/27/2018  . Atypical chest pain 03/27/2018  . Hand pain, right 03/25/2018  . Dyslipidemia 08/15/2017  . History of hysterectomy, supracervical 02/14/2017  . Trigger ring finger of right hand 08/07/2015  . Primary osteoarthritis of first carpometacarpal joint of right hand 08/07/2015  . Diverticulitis of sigmoid colon s/p colectomy 06/30/2014 07/26/2014  . Tachycardia 02/04/2014  . Hx of adenomatous polyp of colon 12/23/2013  . Essential hypertension 11/02/2013  . Former smoker 11/26/2008    Past Surgical History:  Procedure Laterality Date  . ABDOMINAL HYSTERECTOMY    . BREAST LUMPECTOMY  10/11   rt-neg  . BREAST SURGERY  2004   fibroademoma  . CERVICAL CONE BIOPSY  2000  . COLON RESECTION N/A 07/06/2014   Procedure: LAPAROSCOPIC LOOP ILEOSTOMY WITH PLACEMENT OF PELVIC DRAIN;  Surgeon: Donnie Mesa, MD;  Location: [place];  Service: General;  Laterality: N/A;  .  COLON SURGERY     diverticulitis   . COLONOSCOPY    . DILATION AND CURETTAGE OF UTERUS  2009  . DIVERTING ILEOSTOMY Right 06/2014  . ECTOPIC PREGNANCY SURGERY  2005   R salpyngectomy  . ILEOSTOMY N/A 10/26/2014   Procedure: LOOP ILEOSTOMY REVERSAL;  Surgeon: Donnie Mesa, MD;  Location: [place];  Service: General;  Laterality: N/A;  . LAPAROSCOPIC SIGMOID COLECTOMY N/A 06/30/2014   Procedure: LAPAROSCOPIC ASSISTED SIGMOID COLECTOMY;  Surgeon: Donnie Mesa, MD;  Location: [place];  Service:  General;  Laterality: N/A;  . MYOMECTOMY  2008   robotic  . POLYPECTOMY  2009  . ROBOTIC ASSISTED LAP VAGINAL HYSTERECTOMY  2010   supracervical  . SALPINGECTOMY  2005  . TRIGGER FINGER RELEASE Right 09/28/2012   Procedure: RELEASE TRIGGER FINGER/A-1 PULLEY RIGHT THUMB;  Surgeon: Jolyn Nap, MD;  Location: [place];  Service: Orthopedics;  Laterality: Right;  . ULNAR NERVE TRANSPOSITION Right 12/01/2018   Procedure: ULNAR NERVE DECOMPRESSION;  Surgeon: Daryll Brod, MD;  Location: [place];  Service: Orthopedics;  Laterality: Right;  AXILLARY     OB History    Gravida  4   Para  2   Term  2   Preterm      AB  2   Living  2     SAB  0   IAB  1   Ectopic  1   Multiple  0   Live Births              Family History  Problem Relation Age of Onset  . Hypertension Paternal Grandmother   . Diabetes Paternal Grandmother   . Hypertension Maternal Grandmother   . Sarcoidosis Mother   . Hypertension Mother   . Hyperlipidemia Mother   . Thyroid disease Maternal Aunt   . Mental illness Cousin   . Fibroids Sister   . Colon cancer Neg Hx   . Colon polyps Neg Hx   . Esophageal cancer Neg Hx   . Rectal cancer Neg Hx   . Stomach cancer Neg Hx     Social History   Tobacco Use  . Smoking status: Former Smoker    Packs/day: 1.00    Years: 20.00    Pack years: 20.00    Types: Cigarettes    Quit date: 06/13/2011    Years since quitting: 8.8  . Smokeless tobacco: Never Used  Vaping Use  . Vaping Use: Never used  Substance Use Topics  . Alcohol use: Not Currently  . Drug use: No    Home Medications Prior to Admission medications   Medication Sig Start Date End Date Taking? Authorizing Provider  acetaminophen (TYLENOL) 500 MG tablet Take 500 mg by mouth every 6 (six) hours as needed for fever.    [provider]  albuterol (VENTOLIN HFA) 108 (90 Base) MCG/ACT inhaler Inhale 2 puffs into the lungs every 4 (four) hours  as needed for wheezing or shortness of breath. 10/22/19   Joy, Shawn C, PA-C  amLODipine (NORVASC) 5 MG tablet Take 1 tablet (5 mg total) by mouth daily. 04/12/20   Libby Maw, MD  atorvastatin (LIPITOR) 20 MG tablet Take 1 tablet (20 mg total) by mouth daily. 04/12/20   Libby Maw, MD  Black Cohosh (REMIFEMIN) 20 MG TABS Take 40 mg by mouth daily.     [provider]  lisinopril (ZESTRIL) 10 MG tablet Take 1 tablet (10 mg total) by mouth daily.  04/12/20   Libby Maw, MD  Multiple Vitamins-Minerals (HAIR/SKIN/NAILS/BIOTIN PO) Take 1 tablet by mouth daily.    [provider]  nitroGLYCERIN (NITROSTAT) 0.4 MG SL tablet Place 1 tablet (0.4 mg total) under the tongue every 5 (five) minutes as needed for up to 25 days for chest pain. 01/17/20 02/11/20  Adrian Prows, MD  ondansetron (ZOFRAN ODT) 4 MG disintegrating tablet Take 1 tablet (4 mg total) by mouth every 8 (eight) hours as needed for nausea or vomiting. 10/18/19   Little Ishikawa, MD  promethazine (PHENERGAN) 25 MG tablet Take 1 tablet (25 mg total) by mouth every 8 (eight) hours as needed for nausea or vomiting. 10/14/19   Libby Maw, MD    Allergies    Keflex [cephalexin] and Penicillins  Review of Systems   Review of Systems  Constitutional: Positive for chills. Negative for diaphoresis, fatigue and fever.  HENT: Negative for congestion.   Eyes: Negative for visual disturbance.  Respiratory: Negative for cough and shortness of breath.   Cardiovascular: Negative for chest pain.  Gastrointestinal: Positive for abdominal pain and nausea. Negative for constipation and vomiting.  Genitourinary: Positive for flank pain and frequency. Negative for dysuria, hematuria and pelvic pain.  Musculoskeletal: Negative for back pain.  Skin: Negative for rash and wound.  Neurological: Negative for weakness, light-headedness and headaches.  Psychiatric/Behavioral: Negative for agitation and  confusion.  All other systems reviewed and are negative.   Physical Exam Updated Vital Signs BP (!) 133/91 (BP Location: Left Arm)   Pulse 100   Temp 99.1 F (37.3 C) (Oral)   Resp 20   SpO2 99%   Physical Exam Vitals and nursing note reviewed.  Constitutional:      General: She is not in acute distress.    Appearance: She is well-developed. She is not ill-appearing, toxic-appearing or diaphoretic.  HENT:     Head: Normocephalic and atraumatic.  Eyes:     Conjunctiva/sclera: Conjunctivae normal.  Cardiovascular:     Rate and Rhythm: Normal rate and regular rhythm.     Heart sounds: No murmur heard.   Pulmonary:     Effort: Pulmonary effort is normal. No respiratory distress.     Breath sounds: Normal breath sounds. No wheezing, rhonchi or rales.  Chest:     Chest wall: No tenderness.  Abdominal:     General: Abdomen is flat. Bowel sounds are normal.     Palpations: Abdomen is soft.     Tenderness: There is abdominal tenderness in the left lower quadrant. There is no right CVA tenderness, left CVA tenderness, guarding or rebound.  Musculoskeletal:     Cervical back: Neck supple.  Skin:    General: Skin is warm and dry.     Capillary Refill: Capillary refill takes less than 2 seconds.  Neurological:     General: No focal deficit present.     Mental Status: She is alert.  Psychiatric:        Mood and Affect: Mood normal.     ED Results / Procedures / Treatments   Labs (all labs ordered are listed, but only abnormal results are displayed) Labs Reviewed  COMPREHENSIVE METABOLIC PANEL - Abnormal; Notable for the following components:      Result Value   Glucose, Bld 117 (*)    BUN 5 (*)    All other components within normal limits  URINE CULTURE  LIPASE, BLOOD  CBC  LACTIC ACID, PLASMA  URINALYSIS, ROUTINE W REFLEX MICROSCOPIC  EKG None  Radiology No results found.  Procedures Procedures   Medications Ordered in ED Medications  HYDROmorphone  (DILAUDID) injection 1 mg (1 mg Intravenous Given 05/04/20 2257)  ondansetron (ZOFRAN) injection 4 mg (4 mg Intravenous Given 05/04/20 2257)    ED Course  I have reviewed the triage vital signs and the nursing notes.  Pertinent labs & imaging results that were available during my care of the patient were reviewed by me and considered in my medical decision making (see chart for details).    MDM Rules/Calculators/A&P                          [name] is a 50 y.o. female with a past medical history significant for prior diverticulitis with perforation associated intra-abdominal abscess/ previous partial colectomy, ileostomy status post reversal, hypertension, dyslipidemia, obesity, and previous ectopic pregnancy status post hysterectomy.  Patient reports that for the last 3 days, she has developed left lower quadrant abdominal pain that goes towards her left flank.  She reports she has been having increased urination but denies dysuria or hematuria.  She reports has had loose stools but no diarrhea.  She denies any blood in her stool.  She is concerned because this is how she felt when she had a previous diverticulitis that started all of her abdominal surgery.  She says that she has been bloated and feels her abdomen is slightly distended.  She reports nausea no vomiting.  She reports chills but no documented fever at home.  She denies trauma.  She denies any URI symptoms such as congestion, cough, chest pain, shortness of breath.  She denies other complaints on arrival.  On exam, lungs are clear and chest is nontender.  Left lower quadrant is tender to palpation.  Abdomen had normal bowel sounds.  Normal sensation and strength in lower extremities and I feel intact pulses in lower extremities.  Patient otherwise resting comfortably.  Patient reports her pain is moderate.  Will give pain medicine and nausea medicine.  She remains n.p.o. and last time she was around noon.  We will get  screening labs and anticipate she will need CT scan to rule out recurrent diverticulitis.  Felt less likely is pyelonephritis or kidney stone.  Patient had some screening labs in triage.  Patient's creatinine and LFTs are nonelevated.  CBC showed no leukocytosis or anemia.  Lipase not elevated.  We will get urinalysis, lactic acid, and will get the CT scan.  Anticipate reassessment after work-up is completed.  Care transferred to oncoming team while awaiting reassessment after imaging.   Final Clinical Impression(s) / ED Diagnoses Final diagnoses:  LLQ abdominal pain     Clinical Impression: 1. LLQ abdominal pain     Disposition: Care transferred to oncoming team while awaiting reassessment after imaging.  This note was prepared with assistance of Systems analyst. Occasional wrong-word or sound-a-like substitutions may have occurred due to the inherent limitations of voice recognition software.     Ellery Meroney, Gwenyth Allegra, MD 05/04/20 [phone]

## 2020-05-04 NOTE — ED Triage Notes (Signed)
Pt states she has a h/o diverticulitis and started having abd pain x3 days ago. Pt states this feels like a flare up.

## 2020-05-04 NOTE — ED Notes (Signed)
Patient transported to CT 

## 2020-05-05 MED ORDER — DOCUSATE SODIUM 100 MG PO CAPS: 100.0000 mg | ORAL_CAPSULE | Freq: Two times a day (BID) | ORAL | 0 refills | Status: DC

## 2020-05-05 MED ORDER — ONDANSETRON HCL 4 MG/2ML IJ SOLN
4.0000 mg | Freq: Once | INTRAMUSCULAR | Status: AC
  Administered 2020-05-05: 4 mg via INTRAVENOUS
  Filled 2020-05-05: qty 2

## 2020-05-05 MED ORDER — ONDANSETRON 8 MG PO TBDP: ORAL_TABLET | ORAL | 0 refills | Status: DC

## 2020-05-05 MED ORDER — METRONIDAZOLE 500 MG PO TABS: 500.0000 mg | ORAL_TABLET | Freq: Two times a day (BID) | ORAL | 0 refills | Status: DC

## 2020-05-05 MED ORDER — OXYCODONE-ACETAMINOPHEN 5-325 MG PO TABS: 2.0000 | ORAL_TABLET | ORAL | 0 refills | Status: DC | PRN

## 2020-05-05 MED ORDER — CIPROFLOXACIN HCL 500 MG PO TABS: 500.0000 mg | ORAL_TABLET | Freq: Two times a day (BID) | ORAL | 0 refills | Status: DC

## 2020-05-05 NOTE — ED Provider Notes (Signed)
1:54 AM Assumed care from Dr. Sherry Ruffing, please see their note for full history, physical and decision making until this point. In brief this is a 50 y.o. year old female who presented to the ED tonight with Abdominal Pain     H/o severe diverticulitis s/p resection. Here with similar symptoms but appears well overall. Pending CT and reeval.   CT with diverticulitis. Looks well. VS WNL. Abdomen without peritonitis. Review of labs reasurring. Will start with outpatient management for this time.   Discharge instructions, including strict return precautions for new or worsening symptoms, given. Patient and/or family verbalized understanding and agreement with the plan as described.   Labs, studies and imaging reviewed by myself and considered in medical decision making if ordered. Imaging interpreted by radiology.  Labs Reviewed  COMPREHENSIVE METABOLIC PANEL - Abnormal; Notable for the following components:      Result Value   Glucose, Bld 117 (*)    BUN 5 (*)    All other components within normal limits  URINE CULTURE  LIPASE, BLOOD  CBC  LACTIC ACID, PLASMA  URINALYSIS, ROUTINE W REFLEX MICROSCOPIC    CT ABDOMEN PELVIS W CONTRAST  Final Result      No follow-ups on file.    Merrily Pew, MD 05/05/20 2320

## 2020-05-08 ENCOUNTER — Emergency Department (HOSPITAL_COMMUNITY)
Admission: EM | Admit: 2020-05-08 | Discharge: 2020-05-08 | Disposition: A | Discharge status: Home / Self Care | Payer: 59 | Attending: Emergency Medicine | Admitting: Emergency Medicine

## 2020-05-08 ENCOUNTER — Encounter (HOSPITAL_COMMUNITY): Payer: Self-pay | Admitting: Emergency Medicine

## 2020-05-08 ENCOUNTER — Telehealth: Payer: Self-pay | Admitting: Gastroenterology

## 2020-05-08 ENCOUNTER — Emergency Department (HOSPITAL_COMMUNITY): Payer: 59

## 2020-05-08 DIAGNOSIS — K5730 Diverticulosis of large intestine without perforation or abscess without bleeding: Secondary | ICD-10-CM | POA: Insufficient documentation

## 2020-05-08 DIAGNOSIS — Z79899 Other long term (current) drug therapy: Secondary | ICD-10-CM | POA: Insufficient documentation

## 2020-05-08 DIAGNOSIS — I10 Essential (primary) hypertension: Secondary | ICD-10-CM | POA: Insufficient documentation

## 2020-05-08 DIAGNOSIS — Z87891 Personal history of nicotine dependence: Secondary | ICD-10-CM | POA: Diagnosis not present

## 2020-05-08 DIAGNOSIS — Z8616 Personal history of COVID-19: Secondary | ICD-10-CM | POA: Diagnosis not present

## 2020-05-08 DIAGNOSIS — R1032 Left lower quadrant pain: Secondary | ICD-10-CM

## 2020-05-08 LAB — COMPREHENSIVE METABOLIC PANEL
ALT: 34 U/L (ref 0–44)
AST: 29 U/L (ref 15–41)
Albumin: 4.3 g/dL (ref 3.5–5.0)
Alkaline Phosphatase: 60 U/L (ref 38–126)
Anion gap: 10 (ref 5–15)
BUN: 6 mg/dL (ref 6–20)
CO2: 25 mmol/L (ref 22–32)
Calcium: 10 mg/dL (ref 8.9–10.3)
Chloride: 102 mmol/L (ref 98–111)
Creatinine, Ser: 1.01 mg/dL — ABNORMAL HIGH (ref 0.44–1.00)
GFR, Estimated: >60 mL/min (ref >60)
Glucose, Bld: 111 mg/dL — ABNORMAL HIGH (ref 70–99)
Potassium: 3.9 mmol/L (ref 3.5–5.1)
Sodium: 137 mmol/L (ref 135–145)
Total Bilirubin: 0.8 mg/dL (ref 0.3–1.2)
Total Protein: 8 g/dL (ref 6.5–8.1)

## 2020-05-08 LAB — CBC
HCT: 44.3 % (ref 36.0–46.0)
Hemoglobin: 14.4 g/dL (ref 12.0–15.0)
MCH: 29.5 pg (ref 26.0–34.0)
MCHC: 32.5 g/dL (ref 30.0–36.0)
MCV: 90.8 fL (ref 80.0–100.0)
Platelets: 392 10*3/uL (ref 150–400)
RBC: 4.88 MIL/uL (ref 3.87–5.11)
RDW: 12.8 % (ref 11.5–15.5)
WBC: 8.1 10*3/uL (ref 4.0–10.5)
nRBC: 0 % (ref 0.0–0.2)

## 2020-05-08 LAB — URINALYSIS, ROUTINE W REFLEX MICROSCOPIC
Bilirubin Urine: NEGATIVE
Glucose, UA: NEGATIVE mg/dL
Hgb urine dipstick: NEGATIVE
Ketones, ur: NEGATIVE mg/dL
Leukocytes,Ua: NEGATIVE
Nitrite: NEGATIVE
Protein, ur: 30 mg/dL — AB
Specific Gravity, Urine: >1.03 — ABNORMAL HIGH (ref 1.005–1.030)
pH: 6 (ref 5.0–8.0)

## 2020-05-08 LAB — LACTIC ACID, PLASMA: Lactic Acid, Venous: 1.5 mmol/L (ref 0.5–1.9)

## 2020-05-08 LAB — URINALYSIS, MICROSCOPIC (REFLEX): RBC / HPF: NONE SEEN RBC/hpf (ref 0–5)

## 2020-05-08 LAB — LIPASE, BLOOD: Lipase: 29 U/L (ref 11–51)

## 2020-05-08 MED ORDER — HYDROCODONE-ACETAMINOPHEN 5-325 MG PO TABS: 1.0000 | ORAL_TABLET | Freq: Four times a day (QID) | ORAL | 0 refills | Status: DC | PRN

## 2020-05-08 MED ORDER — MORPHINE SULFATE (PF) 4 MG/ML IV SOLN
4.0000 mg | Freq: Once | INTRAVENOUS | Status: AC
  Administered 2020-05-08: 4 mg via INTRAVENOUS
  Filled 2020-05-08: qty 1

## 2020-05-08 MED ORDER — IOHEXOL 350 MG/ML SOLN
100.0000 mL | Freq: Once | INTRAVENOUS | Status: AC | PRN
  Administered 2020-05-08: 100 mL via INTRAVENOUS

## 2020-05-08 MED ORDER — SODIUM CHLORIDE 0.9 % IV BOLUS
1000.0000 mL | Freq: Once | INTRAVENOUS | Status: AC
  Administered 2020-05-08: 1000 mL via INTRAVENOUS

## 2020-05-08 MED ORDER — HYDROCODONE-ACETAMINOPHEN 5-325 MG PO TABS
1.0000 | ORAL_TABLET | Freq: Once | ORAL | Status: AC
  Administered 2020-05-08: 1 via ORAL
  Filled 2020-05-08: qty 1

## 2020-05-08 MED ORDER — CIPROFLOXACIN HCL 500 MG PO TABS: 500.0000 mg | ORAL_TABLET | Freq: Two times a day (BID) | ORAL | 0 refills | Status: AC

## 2020-05-08 MED ORDER — METRONIDAZOLE 500 MG PO TABS: 500.0000 mg | ORAL_TABLET | Freq: Two times a day (BID) | ORAL | 0 refills | Status: AC

## 2020-05-08 NOTE — ED Notes (Signed)
ED Provider at bedside, [place], [place]

## 2020-05-08 NOTE — ED Provider Notes (Signed)
[facility] EMERGENCY DEPARTMENT Provider Note   CSN: [id number] Arrival date & time: 05/08/20  6834     History Chief Complaint  Patient presents with  . Abdominal Pain    [name] is a 50 y.o. female history of obesity, arthritis, GERD, diverticulosis, hysterectomy, hyperlipidemia.  Patient presents today for evaluation of left lower quadrant abdominal pain.  Patient reports that she was seen here 4 days ago diagnosed with diverticulitis.  She reports that she has been taking her antibiotics the past 2-3days without improvement of her symptoms.  She reports that she has not been taking the Percocet because it makes her "have bad dreams".  She describes moderate intensity left lower quadrant pain constant worsened with palpation no alleviating factors no radiation of pain.  Symptoms associated with nausea.  Denies fever/chills, fall/injury, chest pain/shortness of breath, vomiting, diarrhea, melena, hematochezia, dysuria/hematuria, vaginal lesion/discharge or any additional concerns.  HPI     Past Medical History:  Diagnosis Date  . Abnormal Pap smear 2000   [facility] Biopsy  . Allergy   . Arthritis    hands  . Back pain   . Blood transfusion without reported diagnosis 2005  . Breast fibroadenoma   . Diverticulitis of colon with perforation 05/20/2014  . GERD (gastroesophageal reflux disease)   . H/O metrorrhagia 12/2006  . H/O myomectomy 07-2006   robotic  . H/O sinusitis   . Headache(784.0)   . Heart murmur   . History of conization of cervix 2000  . History of ectopic pregnancy 2005   r salpyngectomy  . History of hysterectomy, supracervical 08-2008   robotic  . History of syphilis   . History of syphilis   . Hx of adenomatous polyp of colon 12/31/2017  . Hx of herpes simplex type 2 infection   . Hyperlipidemia   . Hypertension   . Ileostomy present ([place]) 10/26/2014  . Intra-abdominal abscess ([place]) 07/21/2014  . PMS (premenstrual syndrome)      Patient Active Problem List   Diagnosis Date Noted  . COVID-19 virus infection 10/19/2019  . Mixed hyperlipidemia 10/19/2019  . Class 2 severe obesity due to excess calories with serious comorbidity and body mass index (BMI) of 37.0 to 37.9 in adult ([place]) 10/19/2019  . Acute respiratory failure with hypoxia ([place]) 10/19/2019  . Left leg pain 10/04/2019  . Elevated glucose 10/08/2018  . Elevated LDL cholesterol level 10/08/2018  . Healthcare maintenance 10/08/2018  . Need for immunization against influenza 10/08/2018  . Obesity (BMI 30-39.9) 03/29/2018  . Acute chest pain 03/27/2018  . Nonspecific abnormal electrocardiogram (ECG) (EKG) 03/27/2018  . Atypical chest pain 03/27/2018  . Hand pain, right 03/25/2018  . Dyslipidemia 08/15/2017  . History of hysterectomy, supracervical 02/14/2017  . Trigger ring finger of right hand 08/07/2015  . Primary osteoarthritis of first carpometacarpal joint of right hand 08/07/2015  . Diverticulitis of sigmoid colon s/p colectomy 06/30/2014 07/26/2014  . Tachycardia 02/04/2014  . Hx of adenomatous polyp of colon 12/23/2013  . Essential hypertension 11/02/2013  . Former smoker 11/26/2008    Past Surgical History:  Procedure Laterality Date  . ABDOMINAL HYSTERECTOMY    . BREAST LUMPECTOMY  10/11   rt-neg  . BREAST SURGERY  2004   fibroademoma  . CERVICAL CONE BIOPSY  2000  . COLON RESECTION N/A 07/06/2014   Procedure: LAPAROSCOPIC LOOP ILEOSTOMY WITH PLACEMENT OF PELVIC DRAIN;  Surgeon: Donnie Mesa, MD;  Location: [place];  Service: General;  Laterality: N/A;  .  COLON SURGERY     diverticulitis   . COLONOSCOPY    . DILATION AND CURETTAGE OF UTERUS  2009  . DIVERTING ILEOSTOMY Right 06/2014  . ECTOPIC PREGNANCY SURGERY  2005   R salpyngectomy  . ILEOSTOMY N/A 10/26/2014   Procedure: LOOP ILEOSTOMY REVERSAL;  Surgeon: Donnie Mesa, MD;  Location: [place];  Service: General;  Laterality: N/A;  . LAPAROSCOPIC SIGMOID COLECTOMY N/A 06/30/2014    Procedure: LAPAROSCOPIC ASSISTED SIGMOID COLECTOMY;  Surgeon: Donnie Mesa, MD;  Location: [place];  Service: General;  Laterality: N/A;  . MYOMECTOMY  2008   robotic  . POLYPECTOMY  2009  . ROBOTIC ASSISTED LAP VAGINAL HYSTERECTOMY  2010   supracervical  . SALPINGECTOMY  2005  . TRIGGER FINGER RELEASE Right 09/28/2012   Procedure: RELEASE TRIGGER FINGER/A-1 PULLEY RIGHT THUMB;  Surgeon: Jolyn Nap, MD;  Location: [place];  Service: Orthopedics;  Laterality: Right;  . ULNAR NERVE TRANSPOSITION Right 12/01/2018   Procedure: ULNAR NERVE DECOMPRESSION;  Surgeon: Daryll Brod, MD;  Location: [place];  Service: Orthopedics;  Laterality: Right;  AXILLARY     OB History    Gravida  4   Para  2   Term  2   Preterm      AB  2   Living  2     SAB  0   IAB  1   Ectopic  1   Multiple  0   Live Births              Family History  Problem Relation Age of Onset  . Hypertension Paternal Grandmother   . Diabetes Paternal Grandmother   . Hypertension Maternal Grandmother   . Sarcoidosis Mother   . Hypertension Mother   . Hyperlipidemia Mother   . Thyroid disease Maternal Aunt   . Mental illness Cousin   . Fibroids Sister   . Colon cancer Neg Hx   . Colon polyps Neg Hx   . Esophageal cancer Neg Hx   . Rectal cancer Neg Hx   . Stomach cancer Neg Hx     Social History   Tobacco Use  . Smoking status: Former Smoker    Packs/day: 1.00    Years: 20.00    Pack years: 20.00    Types: Cigarettes    Quit date: 06/13/2011    Years since quitting: 8.9  . Smokeless tobacco: Never Used  Vaping Use  . Vaping Use: Never used  Substance Use Topics  . Alcohol use: Not Currently  . Drug use: No    Home Medications Prior to Admission medications   Medication Sig Start Date End Date Taking? Authorizing Provider  HYDROcodone-acetaminophen (NORCO/VICODIN) 5-325 MG tablet Take 1 tablet by mouth every 6 (six) hours as needed. 05/08/20  Yes  Nuala Alpha A, PA-C  acetaminophen (TYLENOL) 500 MG tablet Take 500 mg by mouth every 6 (six) hours as needed for fever or moderate pain.    [provider]  albuterol (VENTOLIN HFA) 108 (90 Base) MCG/ACT inhaler Inhale 2 puffs into the lungs every 4 (four) hours as needed for wheezing or shortness of breath. 10/22/19   Joy, Shawn C, PA-C  amLODipine (NORVASC) 5 MG tablet Take 1 tablet (5 mg total) by mouth daily. 04/12/20   Libby Maw, MD  atorvastatin (LIPITOR) 20 MG tablet Take 1 tablet (20 mg total) by mouth daily. 04/12/20   Libby Maw, MD  Black Cohosh 20 MG TABS Take 40  mg by mouth daily.     [provider]  ciprofloxacin (CIPRO) 500 MG tablet Take 1 tablet (500 mg total) by mouth 2 (two) times daily for 10 days. 05/08/20 05/18/20  Nuala Alpha A, PA-C  docusate sodium (COLACE) 100 MG capsule Take 1 capsule (100 mg total) by mouth every 12 (twelve) hours. 05/05/20   Mesner, Corene Cornea, MD  ibuprofen (ADVIL) 200 MG tablet Take 800 mg by mouth every 6 (six) hours as needed for headache or moderate pain.    [provider]  lisinopril (ZESTRIL) 10 MG tablet Take 1 tablet (10 mg total) by mouth daily. 04/12/20   Libby Maw, MD  metroNIDAZOLE (FLAGYL) 500 MG tablet Take 1 tablet (500 mg total) by mouth 2 (two) times daily for 10 days. One po bid x 7 days 05/08/20 05/18/20  Nuala Alpha A, PA-C  Multiple Vitamins-Minerals (HAIR/SKIN/NAILS/BIOTIN PO) Take 1 tablet by mouth daily.    [provider]  nitroGLYCERIN (NITROSTAT) 0.4 MG SL tablet Place 1 tablet (0.4 mg total) under the tongue every 5 (five) minutes as needed for up to 25 days for chest pain. 01/17/20 02/11/20  Adrian Prows, MD  ondansetron (ZOFRAN ODT) 8 MG disintegrating tablet 8mg  ODT q4 hours prn nausea 05/05/20   Mesner, Corene Cornea, MD  promethazine (PHENERGAN) 25 MG tablet Take 1 tablet (25 mg total) by mouth every 8 (eight) hours as needed for nausea or vomiting. 10/14/19    Libby Maw, MD    Allergies    Keflex [cephalexin] and Penicillins  Review of Systems   Review of Systems Ten systems are reviewed and are negative for acute change except as noted in the HPI  Physical Exam Updated Vital Signs BP (!) 142/79 (BP Location: Right Arm)   Pulse 87   Temp 98.1 F (36.7 C)   Resp 16   SpO2 99%   Physical Exam Constitutional:      General: She is not in acute distress.    Appearance: Normal appearance. She is well-developed. She is not ill-appearing or diaphoretic.  HENT:     Head: Normocephalic and atraumatic.  Eyes:     General: Vision grossly intact. Gaze aligned appropriately.     Pupils: Pupils are equal, round, and reactive to light.  Neck:     Trachea: Trachea and phonation normal.  Pulmonary:     Effort: Pulmonary effort is normal. No respiratory distress.  Abdominal:     General: There is no distension.     Palpations: Abdomen is soft.     Tenderness: There is abdominal tenderness in the left lower quadrant. There is guarding. There is no rebound.  Musculoskeletal:        General: Normal range of motion.     Cervical back: Normal range of motion.  Skin:    General: Skin is warm and dry.  Neurological:     Mental Status: She is alert.     GCS: GCS eye subscore is 4. GCS verbal subscore is 5. GCS motor subscore is 6.     Comments: Speech is clear and goal oriented, follows commands Major Cranial nerves without deficit, no facial droop Moves extremities without ataxia, coordination intact  Psychiatric:        Behavior: Behavior normal.     ED Results / Procedures / Treatments   Labs (all labs ordered are listed, but only abnormal results are displayed) Labs Reviewed  COMPREHENSIVE METABOLIC PANEL - Abnormal; Notable for the following components:  Result Value   Glucose, Bld 111 (*)    Creatinine, Ser 1.01 (*)    All other components within normal limits  URINALYSIS, ROUTINE W REFLEX MICROSCOPIC - Abnormal;  Notable for the following components:   Specific Gravity, Urine >1.030 (*)    Protein, ur 30 (*)    All other components within normal limits  URINALYSIS, MICROSCOPIC (REFLEX) - Abnormal; Notable for the following components:   Bacteria, UA MANY (*)    All other components within normal limits  LIPASE, BLOOD  CBC  LACTIC ACID, PLASMA  LACTIC ACID, PLASMA    EKG None  Radiology CT ABDOMEN PELVIS W CONTRAST  Result Date: 05/08/2020 CLINICAL DATA:  Left lower quadrant pain. Recently diagnosed with diverticulitis. EXAM: CT ABDOMEN AND PELVIS WITH CONTRAST TECHNIQUE: Multidetector CT imaging of the abdomen and pelvis was performed using the standard protocol following bolus administration of intravenous contrast. CONTRAST:  151mL OMNIPAQUE IOHEXOL 350 MG/ML SOLN COMPARISON:  05/04/2020. FINDINGS: Lower chest: Centrilobular emphsyema noted. Hepatobiliary: Focal hypo attenuation in the subcapsular left liver along the falciform ligament (image 20/series 3) likely represents focal fatty deposition. There is no evidence for gallstones, gallbladder wall thickening, or pericholecystic fluid. No intrahepatic or extrahepatic biliary dilation. Pancreas: No focal mass lesion. No dilatation of the main duct. No intraparenchymal cyst. No peripancreatic edema. Spleen: No splenomegaly. No focal mass lesion. Adrenals/Urinary Tract: No adrenal nodule or mass. Kidneys unremarkable. No evidence for hydroureter. The urinary bladder appears normal for the degree of distention. Stomach/Bowel: Stomach is unremarkable. No gastric wall thickening. No evidence of outlet obstruction. Duodenum is normally positioned as is the ligament of Treitz. No small bowel wall thickening. No small bowel dilatation. Small bowel anastomosis noted right pelvis. The terminal ileum is normal. The appendix is normal. No gross colonic mass. No colonic wall thickening. Diverticular changes are noted in the left colon without evidence of  diverticulitis. Anastomosis noted in the sigmoid colon. Subtle edema/inflammation noted posterior to the junction of the descending and sigmoid colon previously has decreased in the interval and nearly resolved. No evidence for extraluminal gas or para colonic abscess. Vascular/Lymphatic: There is abdominal aortic atherosclerosis without aneurysm. There is no gastrohepatic or hepatoduodenal ligament lymphadenopathy. No retroperitoneal or mesenteric lymphadenopathy. No pelvic sidewall lymphadenopathy. Reproductive: Unremarkable. Other: No intraperitoneal free fluid. Musculoskeletal: No worrisome lytic or sclerotic osseous abnormality. Small hernia noted right rectus sheath containing only fat. This is probably a previous stoma site. IMPRESSION: 1. Interval near complete resolution of the subtle edema/inflammation posterior to the proximal sigmoid colon seen previously. No evidence for extraluminal gas or para colonic abscess. 2. Left colonic diverticulosis without diverticulitis. 3. Anastomotic anatomy in small bowel the right lower quadrant and sigmoid colon. 4. Aortic Atherosclerosis (ICD10-I70.0) and Emphysema (ICD10-J43.9). Electronically Signed   By: Misty Stanley M.D.   On: 05/08/2020 13:17    Procedures Procedures   Medications Ordered in ED Medications  HYDROcodone-acetaminophen (NORCO/VICODIN) 5-325 MG per tablet 1 tablet (has no administration in time range)  sodium chloride 0.9 % bolus 1,000 mL (1,000 mLs Intravenous New Bag/Given 05/08/20 1049)  morphine 4 MG/ML injection 4 mg (4 mg Intravenous Given 05/08/20 1047)  iohexol (OMNIPAQUE) 350 MG/ML injection 100 mL (100 mLs Intravenous Contrast Given 05/08/20 1246)    ED Course  I have reviewed the triage vital signs and the nursing notes.  Pertinent labs & imaging results that were available during my care of the patient were reviewed by me and considered in my medical decision making (see  chart for details).  Clinical Course as of 05/08/20  1702  Mon May 08, 2020  1406 General Surgery [BM]    Clinical Course User Index [BM] Gari Crown   MDM Rules/Calculators/A&P                         Additional history obtained from: 1. Nursing notes from this visit. 2. Review of electronic medical records.  Patient seen in the ER 05/04/2020, diagnosis left lower quadrant abdominal pain diverticulitis.  Patient was prescribed Cipro/Flagyl, docusate, Percocet and Zofran.  CT abdomen pelvis showed mild diverticulitis. ------------------------- 50 year old female presented for continued left lower quadrant abdominal pain recent diagnosed with diverticulitis.  On exam she is tender in the left lower quadrant concern for failed outpatient treatment of diverticulitis versus worsening a possible perforation.  Risk versus benefits of CT abdomen pelvis were discussed with patient and she has elected to proceed with CT of the pelvis for further evaluation.  Basic labs also ordered. - I ordered, reviewed and interpreted labs which include: CMP shows no emergent lecture derangement, LFT elevations or gap.  Creatinine 1.01 slightly elevated from prior. Lipase within normal limits, doubt pancreatitis. Lactic 1.5, reassuring. CBC within normal limits. UDS shows bacteria, no WBCs nitrates or leukocyte, doubt infection.  No hemoglobin to suggest kidney stone disease. Patient with prior hysterectomy doubt pregnancy.  CTAP:  IMPRESSION:  1. Interval near complete resolution of the subtle  edema/inflammation posterior to the proximal sigmoid colon seen  previously. No evidence for extraluminal gas or para colonic  abscess.  2. Left colonic diverticulosis without diverticulitis.  3. Anastomotic anatomy in small bowel the right lower quadrant and  sigmoid colon.  4. Aortic Atherosclerosis (ICD10-I70.0) and Emphysema (ICD10-J43.9).  - Consult placed to general surgery who advises they will come by to see the patient. - Patient seen evaluated  by general surgery, they advised continuing patient's antibiotics x10 days and changing her p.o. medication to something patient can tolerate.  Advised patient follow-up with Dr. Rush Landmark. - Patient reassessed resting comfortably bed no acute distress vital signs are stable.  Discussed general surgery recommendations the patient she is agreeable.  I have advised patient take her previously prescribed Percocet to the nearest pharmacy for proper disposal.  Patient was prescribed Vicodin which she reports she does not have as much reaction to for pain control.  We discussed narcotic precautions and patient states understanding.  Patient was also prescribed Cipro/Flagyl x10 more days and encouraged to follow-up with Dr. Rush Landmark.  Patient Kircher drink water to avoid dehydration get plenty of rest.  At this time there does not appear to be any evidence of an acute emergency medical condition and the patient appears stable for discharge with appropriate outpatient follow up. Diagnosis was discussed with patient who verbalizes understanding of care plan and is agreeable to discharge. I have discussed return precautions with patient who verbalizes understanding. Patient encouraged to follow-up with their PCP and Dr. Rush Landmark. All questions answered.  Patient's case discussed with Dr. Jeanell Sparrow who agrees with plan to discharge with follow-up.   Note: Portions of this report may have been transcribed using voice recognition software. Every effort was made to ensure accuracy; however, inadvertent computerized transcription errors may still be present. Final Clinical Impression(s) / ED Diagnoses Final diagnoses:  None    Rx / DC Orders ED Discharge Orders         Ordered    metroNIDAZOLE (FLAGYL) 500 MG tablet  2 times daily        05/08/20 1655    ciprofloxacin (CIPRO) 500 MG tablet  2 times daily        05/08/20 1655    HYDROcodone-acetaminophen (NORCO/VICODIN) 5-325 MG tablet  Every 6 hours PRN         05/08/20 1655           Gari Crown 05/08/20 1702    Pattricia Boss, MD 05/09/20 1422

## 2020-05-08 NOTE — Telephone Encounter (Signed)
The pt is currently in the ED for evaluation.

## 2020-05-08 NOTE — Consult Note (Signed)
[facility] Surgery Consult Note  [name] [facility] Oct 11, 1970  [id number].    Requesting MD:  Chief Complaint LLQ pain Reason for Consult: recurrent diverticulitis PCP: Patient, No Pcp Per   HPI: Patient is a 50 year old female presents to the ED with recurrent left lower quadrant pain.  She was seen on 05/04/2020 started on oral antibiotics.  She has had minimal improvement and returns to the ED with 6/10 left lower quadrant pain, night sweats, nausea, and poor appetite.  She has been on oral antibiotics since 05/04/2020.  She continues to have cramping left lower quadrant, some nausea, and some soft stools after taking liquids.  She returned for further evaluation. Patient underwent a laparoscopic-assisted sigmoid colectomy on 06/30/2014 for recurrent sigmoid diverticulitis by Dr. Donnie Mesa.  She developed an anastomotic leak was taken back to the OR on 07/06/2014 for laparoscopic placement of a pelvic drain and laparoscopic loop ileostomy.  She underwent loop ileostomy reversal on 10/26/2014 by Dr. Georgette Dover also.  Work-up in the ED shows she is afebrile, mildly tachycardic, with some hypertension.  Labs show CMP that is normal except for glucose of 111, and a creatinine of 1.01.  WBC on 05/04/2020 was 8.1.  Repeat today is also 8.1.  H/H 13.5/41.3>> 14.4/44.3.  Urinalysis is unremarkable.  CT obtained on 05/04/2020: Showed surgical anastomosis bowel seen within the region of the mid to distal sigmoid colon.  No evidence of bowel dilatation.  Mild inflamed diverticuli are seen within the proximal sigmoid colon there is no evidence of associated perforation or abscess.  Impression was mild sigmoid diverticulitis, hepatic steatosis, aortic atheroscleros  Repeat CT today 05/08/20: Central lobular emphysema, diverticular changes in the left colon without evidence of diverticulitis.  Mass was noted in the sigmoid colon with subtle Edema/inflammation noted posterior to the junction of the descending and  sigmoid colon has nearly resolved.  No evidence of extraluminal gas or pericolonic abscess.  We are asked to see.    ROS: Review of Systems  Constitutional: Positive for fever (low grade 3/17, has not taken her temp since that time).  HENT: Negative.   Eyes: Negative.   Respiratory: Negative.   Cardiovascular: Negative.   Gastrointestinal: Positive for abdominal pain (LLQ), constipation (better with soft stools on colace), nausea and vomiting. Negative for diarrhea.  Genitourinary: Negative.   Musculoskeletal: Negative.   Skin: Negative.   Neurological: Negative.   Endo/Heme/Allergies: Negative.   Psychiatric/Behavioral: Negative.     Family History  Problem Relation Age of Onset  . Hypertension Paternal Grandmother   . Diabetes Paternal Grandmother   . Hypertension Maternal Grandmother   . Sarcoidosis Mother   . Hypertension Mother   . Hyperlipidemia Mother   . Thyroid disease Maternal Aunt   . Mental illness Cousin   . Fibroids Sister   . Colon cancer Neg Hx   . Colon polyps Neg Hx   . Esophageal cancer Neg Hx   . Rectal cancer Neg Hx   . Stomach cancer Neg Hx     Past Medical History:  Diagnosis Date  . Abnormal Pap smear 2000   [facility] Biopsy  . Allergy   . Arthritis    hands  . Back pain   . Blood transfusion without reported diagnosis 2005  . Breast fibroadenoma   . Diverticulitis of colon with perforation 05/20/2014  . GERD (gastroesophageal reflux disease)   . H/O metrorrhagia 12/2006  . H/O myomectomy 07-2006   robotic  . H/O sinusitis   . Headache(784.0)   .  Heart murmur   . History of conization of cervix 2000  . History of ectopic pregnancy 2005   r salpyngectomy  . History of hysterectomy, supracervical 08-2008   robotic  . History of syphilis   . History of syphilis   . Hx of adenomatous polyp of colon 12/31/2017  . Hx of herpes simplex type 2 infection   . Hyperlipidemia   . Hypertension   . Ileostomy present ([place]) 10/26/2014  .  Intra-abdominal abscess ([place]) 07/21/2014  . PMS (premenstrual syndrome)     Past Surgical History:  Procedure Laterality Date  . ABDOMINAL HYSTERECTOMY    . BREAST LUMPECTOMY  10/11   rt-neg  . BREAST SURGERY  2004   fibroademoma  . CERVICAL CONE BIOPSY  2000  . COLON RESECTION N/A 07/06/2014   Procedure: LAPAROSCOPIC LOOP ILEOSTOMY WITH PLACEMENT OF PELVIC DRAIN;  Surgeon: Donnie Mesa, MD;  Location: [place];  Service: General;  Laterality: N/A;  . COLON SURGERY     diverticulitis   . COLONOSCOPY    . DILATION AND CURETTAGE OF UTERUS  2009  . DIVERTING ILEOSTOMY Right 06/2014  . ECTOPIC PREGNANCY SURGERY  2005   R salpyngectomy  . ILEOSTOMY N/A 10/26/2014   Procedure: LOOP ILEOSTOMY REVERSAL;  Surgeon: Donnie Mesa, MD;  Location: [place];  Service: General;  Laterality: N/A;  . LAPAROSCOPIC SIGMOID COLECTOMY N/A 06/30/2014   Procedure: LAPAROSCOPIC ASSISTED SIGMOID COLECTOMY;  Surgeon: Donnie Mesa, MD;  Location: [place];  Service: General;  Laterality: N/A;  . MYOMECTOMY  2008   robotic  . POLYPECTOMY  2009  . ROBOTIC ASSISTED LAP VAGINAL HYSTERECTOMY  2010   supracervical  . SALPINGECTOMY  2005  . TRIGGER FINGER RELEASE Right 09/28/2012   Procedure: RELEASE TRIGGER FINGER/A-1 PULLEY RIGHT THUMB;  Surgeon: Jolyn Nap, MD;  Location: [place];  Service: Orthopedics;  Laterality: Right;  . ULNAR NERVE TRANSPOSITION Right 12/01/2018   Procedure: ULNAR NERVE DECOMPRESSION;  Surgeon: Daryll Brod, MD;  Location: [place];  Service: Orthopedics;  Laterality: Right;  AXILLARY    Social History:  reports that she quit smoking about 8 years ago. Her smoking use included cigarettes. She has a 20.00 pack-year smoking history. She has never used smokeless tobacco. She reports previous alcohol use. She reports that she does not use drugs.  Allergies:  Allergies  Allergen Reactions  . Keflex [Cephalexin] Anaphylaxis  . Penicillins Anaphylaxis    Has  patient had a PCN reaction causing immediate rash, facial/tongue/throat swelling, SOB or lightheadedness with hypotension: Yes Has patient had a PCN reaction causing severe rash involving mucus membranes or skin necrosis: No Has patient had a PCN reaction that required hospitalization: No Has patient had a PCN reaction occurring within the last 10 years: No If all of the above answers are "NO", then may proceed with Cephalosporin use.     Prior to Admission medications   Medication Sig Start Date End Date Taking? Authorizing Provider  acetaminophen (TYLENOL) 500 MG tablet Take 500 mg by mouth every 6 (six) hours as needed for fever or moderate pain.    [provider]  albuterol (VENTOLIN HFA) 108 (90 Base) MCG/ACT inhaler Inhale 2 puffs into the lungs every 4 (four) hours as needed for wheezing or shortness of breath. 10/22/19   Joy, Shawn C, PA-C  amLODipine (NORVASC) 5 MG tablet Take 1 tablet (5 mg total) by mouth daily. 04/12/20   Libby Maw, MD  atorvastatin (LIPITOR) 20 MG tablet  Take 1 tablet (20 mg total) by mouth daily. 04/12/20   Libby Maw, MD  Black Cohosh 20 MG TABS Take 40 mg by mouth daily.     [provider]  ciprofloxacin (CIPRO) 500 MG tablet Take 1 tablet (500 mg total) by mouth 2 (two) times daily. 05/05/20   Mesner, Corene Cornea, MD  docusate sodium (COLACE) 100 MG capsule Take 1 capsule (100 mg total) by mouth every 12 (twelve) hours. 05/05/20   Mesner, Corene Cornea, MD  ibuprofen (ADVIL) 200 MG tablet Take 800 mg by mouth every 6 (six) hours as needed for headache or moderate pain.    [provider]  lisinopril (ZESTRIL) 10 MG tablet Take 1 tablet (10 mg total) by mouth daily. 04/12/20   Libby Maw, MD  metroNIDAZOLE (FLAGYL) 500 MG tablet Take 1 tablet (500 mg total) by mouth 2 (two) times daily. One po bid x 7 days 05/05/20   Mesner, Corene Cornea, MD  Multiple Vitamins-Minerals (HAIR/SKIN/NAILS/BIOTIN PO) Take 1 tablet by mouth daily.     [provider]  nitroGLYCERIN (NITROSTAT) 0.4 MG SL tablet Place 1 tablet (0.4 mg total) under the tongue every 5 (five) minutes as needed for up to 25 days for chest pain. 01/17/20 02/11/20  Adrian Prows, MD  ondansetron (ZOFRAN ODT) 8 MG disintegrating tablet 8mg  ODT q4 hours prn nausea 05/05/20   Mesner, Corene Cornea, MD  oxyCODONE-acetaminophen (PERCOCET) 5-325 MG tablet Take 2 tablets by mouth every 4 (four) hours as needed. 05/05/20   Mesner, Corene Cornea, MD  promethazine (PHENERGAN) 25 MG tablet Take 1 tablet (25 mg total) by mouth every 8 (eight) hours as needed for nausea or vomiting. 10/14/19   Libby Maw, MD     Blood pressure (!) 152/87, pulse 91, temperature 98.1 F (36.7 C), resp. rate 17, SpO2 99 %. Physical Exam:  General: pleasant, WD,AA FEmale who is laying in bed in NAD HEENT: head is normocephalic, atraumatic.  Sclera are noninjected.  Pupils are equal ears and nose without any masses or lesions.  Mouth is pink and moist Heart: regular, rate, and rhythm.  Normal s1,s2. No obvious murmurs, gallops, or rubs noted.  Palpable radial and pedal pulses bilaterally Lungs: CTAB, no wheezes, rhonchi, or rales noted.  Respiratory effort nonlabored Abd: soft, ongoing tenderness left lower quadrant., ND, +BS, no masses, hernias, or organomegaly.  Well-healed surgical scars.  No peritonitis. MS: all 4 extremities are symmetrical with no cyanosis, clubbing, or edema. Skin: warm and dry with no masses, lesions, or rashes Neuro: Cranial nerves 2-12 grossly intact, sensation is normal throughout Psych: A&Ox3 with an appropriate affect.   Results for orders placed or performed during the hospital encounter of 05/08/20 (from the past 48 hour(s))  Lipase, blood     Status: None   Collection Time: 05/08/20  9:58 AM  Result Value Ref Range   Lipase 29 11 - 51 U/L    Comment: Performed at [facility], [address] [place], [place] [address]  Comprehensive metabolic panel      Status: Abnormal   Collection Time: 05/08/20  9:58 AM  Result Value Ref Range   Sodium 137 135 - 145 mmol/L   Potassium 3.9 3.5 - 5.1 mmol/L   Chloride 102 98 - 111 mmol/L   CO2 25 22 - 32 mmol/L   Glucose, Bld 111 (H) 70 - 99 mg/dL    Comment: Glucose reference range applies only to samples taken after fasting for at least 8 hours.  BUN 6 6 - 20 mg/dL   Creatinine, Ser 1.01 (H) 0.44 - 1.00 mg/dL   Calcium 10.0 8.9 - 10.3 mg/dL   Total Protein 8.0 6.5 - 8.1 g/dL   Albumin 4.3 3.5 - 5.0 g/dL   AST 29 15 - 41 U/L   ALT 34 0 - 44 U/L   Alkaline Phosphatase 60 38 - 126 U/L   Total Bilirubin 0.8 0.3 - 1.2 mg/dL   GFR, Estimated >60 >60 mL/min    Comment: (NOTE) Calculated using the CKD-EPI Creatinine Equation (2021)    Anion gap 10 5 - 15    Comment: Performed at [facility] [address], [place], [place] [address]  CBC     Status: None   Collection Time: 05/08/20  9:58 AM  Result Value Ref Range   WBC 8.1 4.0 - 10.5 K/uL   RBC 4.88 3.87 - 5.11 MIL/uL   Hemoglobin 14.4 12.0 - 15.0 g/dL   HCT 44.3 36.0 - 46.0 %   MCV 90.8 80.0 - 100.0 fL   MCH 29.5 26.0 - 34.0 pg   MCHC 32.5 30.0 - 36.0 g/dL   RDW 12.8 11.5 - 15.5 %   Platelets 392 150 - 400 K/uL   nRBC 0.0 0.0 - 0.2 %    Comment: Performed at [facility], [place] [address], [place], [place] [address]  Urinalysis, Routine w reflex microscopic Urine, Clean Catch     Status: Abnormal   Collection Time: 05/08/20  9:58 AM  Result Value Ref Range   Color, Urine YELLOW YELLOW   APPearance CLEAR CLEAR   Specific Gravity, Urine >1.030 (H) 1.005 - 1.030   pH 6.0 5.0 - 8.0   Glucose, UA NEGATIVE NEGATIVE mg/dL   Hgb urine dipstick NEGATIVE NEGATIVE   Bilirubin Urine NEGATIVE NEGATIVE   Ketones, ur NEGATIVE NEGATIVE mg/dL   Protein, ur 30 (A) NEGATIVE mg/dL   Nitrite NEGATIVE NEGATIVE   Leukocytes,Ua NEGATIVE NEGATIVE    Comment: Performed at [facility] [address], [place], [place] [address]   Urinalysis, Microscopic (reflex)     Status: Abnormal   Collection Time: 05/08/20  9:58 AM  Result Value Ref Range   RBC / HPF NONE SEEN 0 - 5 RBC/hpf   WBC, UA 0-5 0 - 5 WBC/hpf   Bacteria, UA MANY (A) NONE SEEN   Squamous Epithelial / LPF 0-5 0 - 5   Hyaline Casts, UA PRESENT     Comment: Performed at [facility], [place] [address], [place], [place] [address]   CT ABDOMEN PELVIS W CONTRAST  Result Date: 05/08/2020 CLINICAL DATA:  Left lower quadrant pain. Recently diagnosed with diverticulitis. EXAM: CT ABDOMEN AND PELVIS WITH CONTRAST TECHNIQUE: Multidetector CT imaging of the abdomen and pelvis was performed using the standard protocol following bolus administration of intravenous contrast. CONTRAST:  13mL OMNIPAQUE IOHEXOL 350 MG/ML SOLN COMPARISON:  05/04/2020. FINDINGS: Lower chest: Centrilobular emphsyema noted. Hepatobiliary: Focal hypo attenuation in the subcapsular left liver along the falciform ligament (image 20/series 3) likely represents focal fatty deposition. There is no evidence for gallstones, gallbladder wall thickening, or pericholecystic fluid. No intrahepatic or extrahepatic biliary dilation. Pancreas: No focal mass lesion. No dilatation of the main duct. No intraparenchymal cyst. No peripancreatic edema. Spleen: No splenomegaly. No focal mass lesion. Adrenals/Urinary Tract: No adrenal nodule or mass. Kidneys unremarkable. No evidence for hydroureter. The urinary bladder appears normal for the degree of distention. Stomach/Bowel: Stomach is unremarkable. No  gastric wall thickening. No evidence of outlet obstruction. Duodenum is normally positioned as is the ligament of Treitz. No small bowel wall thickening. No small bowel dilatation. Small bowel anastomosis noted right pelvis. The terminal ileum is normal. The appendix is normal. No gross colonic mass. No colonic wall thickening. Diverticular changes are noted in the left colon without evidence of diverticulitis.  Anastomosis noted in the sigmoid colon. Subtle edema/inflammation noted posterior to the junction of the descending and sigmoid colon previously has decreased in the interval and nearly resolved. No evidence for extraluminal gas or para colonic abscess. Vascular/Lymphatic: There is abdominal aortic atherosclerosis without aneurysm. There is no gastrohepatic or hepatoduodenal ligament lymphadenopathy. No retroperitoneal or mesenteric lymphadenopathy. No pelvic sidewall lymphadenopathy. Reproductive: Unremarkable. Other: No intraperitoneal free fluid. Musculoskeletal: No worrisome lytic or sclerotic osseous abnormality. Small hernia noted right rectus sheath containing only fat. This is probably a previous stoma site. IMPRESSION: 1. Interval near complete resolution of the subtle edema/inflammation posterior to the proximal sigmoid colon seen previously. No evidence for extraluminal gas or para colonic abscess. 2. Left colonic diverticulosis without diverticulitis. 3. Anastomotic anatomy in small bowel the right lower quadrant and sigmoid colon. 4. Aortic Atherosclerosis (ICD10-I70.0) and Emphysema (ICD10-J43.9). Electronically Signed   By: Misty Stanley M.D.   On: 05/08/2020 13:17      Assessment/Plan Hypertension Hyperlipidemia Hx Covid/hospitalized 2021   Recurrent left-sided diverticulitis S/P laparoscopic assisted sigmoid colectomy 06/30/2014, Dr. Donnie Mesa Postop anastomosis leak with laparoscopic placement of pelvic drain, washout and loop ileostomy, 07/06/2014, Loop ileostomy reversal 10/26/2014, Dr. Georgette Dover  Plan: Reviewed with Dr. Grandville Silos.  Recommend ongoing antibiotics for 10 days.  Change her p.o. pain medicines and obtain primary care physician for follow-up or DR. Mansouraty who did her colonoscopy on 12/29/2018.  Earnstine Regal [facility] Surgery 05/08/2020, 2:14 PM Please see Amion for pager number during day hours 7:00am-4:30pm

## 2020-05-08 NOTE — ED Triage Notes (Signed)
Pt was seen here on 3/17 for LLQ pain was told she had reoccurance of diverticulitis. She was started on oral antibiotics which she is still on. She has had minimal improvement still having 6/10 LLQ pain with night sweats, nausea and poor appetite.

## 2020-05-08 NOTE — Telephone Encounter (Signed)
Patient called and stated she was just seen at the ED on 05/04/20 for abdominal pain from Diverticulitis and she said she is still having a lot of pain and is seeking advise.

## 2020-05-08 NOTE — ED Notes (Signed)
ED Provider at bedside.\[id number]\\[id number]\

## 2020-05-08 NOTE — Discharge Instructions (Addendum)
At this time there does not appear to be the presence of an emergent medical condition, however there is always the potential for conditions to change. Please read and follow the below instructions.  Please return to the Emergency Department immediately for any new or worsening symptoms. Please be sure to follow up with your Primary Care Provider within one week regarding your visit today; please call their office to schedule an appointment even if you are feeling better for a follow-up visit. You may take the medication Norco (Hydrocodone/Acetaminophen) as prescribed to help with severe pain.  This medication will make you drowsy so do not drive, drink alcohol, take other sedating medications or perform any dangerous activities such as driving after taking Norco. Norco contains Tylenol (acetaminophen) so do not take any other Tylenol-containing products with Norco. Please return your Percocet that you were prescribed during your last visit to the pharmacy for proper disposal. Please continue taking your antibiotic Flagyl and Cipro as prescribed for the next 10 days.  Please drink plenty water to avoid dehydration and get plenty of rest. Please call Dr. Allison Quarry to schedule a follow-up visit.  Go to the nearest Emergency Department immediately if: You have fever or chills Your symptoms do not get better. Your symptoms get worse very fast. You have a fever. You vomit more than one time. You have poop that is: Bloody. Black. Tarry. You have any new/concerning or worsening of symptoms.   Please read the additional information packets attached to your discharge summary.  Do not take your medicine if  develop an itchy rash, swelling in your mouth or lips, or difficulty breathing; call 911 and seek immediate emergency medical attention if this occurs.  You may review your lab tests and imaging results in their entirety on your MyChart account.  Please discuss all results of fully with your  primary care provider and other specialist at your follow-up visit.  Note: Portions of this text may have been transcribed using voice recognition software. Every effort was made to ensure accuracy; however, inadvertent computerized transcription errors may still be present.

## 2020-05-08 NOTE — ED Notes (Signed)
As per triage, "Pt was seen here on 3/17 for LLQ pain was told she had reoccurance of diverticulitis. She was started on oral antibiotics which she is still on. She has had minimal improvement still having 6/10 LLQ pain with night sweats, nausea and poor appetite."

## 2020-05-09 ENCOUNTER — Encounter: Payer: Self-pay | Admitting: Gastroenterology

## 2020-05-15 ENCOUNTER — Ambulatory Visit: Payer: 59 | Admitting: Family Medicine

## 2020-05-16 ENCOUNTER — Ambulatory Visit: Payer: 59 | Admitting: Physician Assistant

## 2020-05-20 ENCOUNTER — Other Ambulatory Visit: Payer: Self-pay | Admitting: Family Medicine

## 2020-05-20 DIAGNOSIS — I10 Essential (primary) hypertension: Secondary | ICD-10-CM

## 2020-05-20 DIAGNOSIS — I208 Other forms of angina pectoris: Secondary | ICD-10-CM

## 2020-05-22 ENCOUNTER — Telehealth: Payer: Self-pay

## 2020-05-22 DIAGNOSIS — I208 Other forms of angina pectoris: Secondary | ICD-10-CM

## 2020-05-22 DIAGNOSIS — I10 Essential (primary) hypertension: Secondary | ICD-10-CM

## 2020-05-22 MED ORDER — AMLODIPINE BESYLATE 5 MG PO TABS: 5.0000 mg | ORAL_TABLET | Freq: Every day | ORAL | 0 refills | Status: DC

## 2020-05-22 MED ORDER — ATORVASTATIN CALCIUM 20 MG PO TABS: 20.0000 mg | ORAL_TABLET | Freq: Every day | ORAL | 0 refills | Status: DC

## 2020-05-22 MED ORDER — LISINOPRIL 10 MG PO TABS: 10.0000 mg | ORAL_TABLET | Freq: Every day | ORAL | 0 refills | Status: DC

## 2020-05-22 NOTE — Telephone Encounter (Signed)
rx sent to the pharmacy and patient notified VIA phone. Dm/cma

## 2020-05-22 NOTE — Telephone Encounter (Signed)
Pt said she spoke with Langley Gauss earlier. She has made a TOC appt on 06/29/20. She said that her meds could be refilled if she has an appt because she is running out. She needs;  amLODipine (NORVASC) 5 MG tablet    lisinopril (ZESTRIL) 10 MG tablet   atorvastatin (LIPITOR) 20 MG tablet  Pharmacy is [facility] at [address] and [address]

## 2020-06-29 ENCOUNTER — Encounter: Payer: Self-pay | Admitting: Family Medicine

## 2020-06-29 ENCOUNTER — Other Ambulatory Visit: Payer: Self-pay

## 2020-06-29 ENCOUNTER — Ambulatory Visit (INDEPENDENT_AMBULATORY_CARE_PROVIDER_SITE_OTHER): Payer: 59 | Admitting: Family Medicine

## 2020-06-29 VITALS — BP 124/78 | HR 97 | Temp 97.8°F | Ht 64.0 in | Wt 223.6 lb

## 2020-06-29 DIAGNOSIS — I700 Atherosclerosis of aorta: Secondary | ICD-10-CM

## 2020-06-29 DIAGNOSIS — E669 Obesity, unspecified: Secondary | ICD-10-CM

## 2020-06-29 DIAGNOSIS — Z86010 Personal history of colonic polyps: Secondary | ICD-10-CM

## 2020-06-29 DIAGNOSIS — E782 Mixed hyperlipidemia: Secondary | ICD-10-CM

## 2020-06-29 DIAGNOSIS — I10 Essential (primary) hypertension: Secondary | ICD-10-CM

## 2020-06-29 DIAGNOSIS — Z87891 Personal history of nicotine dependence: Secondary | ICD-10-CM

## 2020-06-29 DIAGNOSIS — R7309 Other abnormal glucose: Secondary | ICD-10-CM

## 2020-06-29 DIAGNOSIS — Z860101 Personal history of adenomatous and serrated colon polyps: Secondary | ICD-10-CM

## 2020-06-29 DIAGNOSIS — J432 Centrilobular emphysema: Secondary | ICD-10-CM

## 2020-06-29 MED ORDER — METFORMIN HCL 500 MG PO TABS
500.0000 mg | ORAL_TABLET | Freq: Two times a day (BID) | ORAL | 3 refills | Status: DC
Start: 2020-06-29 — End: 2021-03-01

## 2020-06-29 MED ORDER — AMLODIPINE BESYLATE 5 MG PO TABS: 5.0000 mg | ORAL_TABLET | Freq: Every day | ORAL | 0 refills | Status: DC

## 2020-06-29 MED ORDER — ATORVASTATIN CALCIUM 20 MG PO TABS: 20.0000 mg | ORAL_TABLET | Freq: Every day | ORAL | 0 refills | Status: DC

## 2020-06-29 MED ORDER — LISINOPRIL 10 MG PO TABS: 10.0000 mg | ORAL_TABLET | Freq: Every day | ORAL | 0 refills | Status: DC

## 2020-06-29 NOTE — Progress Notes (Signed)
[facility] [address] [place] [address] Dept: [phone] Dept Fax: [phone]  Transfer of Care Office Visit  Subjective:    Patient ID: [name], female    DOB: April 26, 1970, 50 y.o..   MRN: [id number]  Chief Complaint  Patient presents with  . Establish Care    TOC- f/u meds.     History of Present Illness:  Patient is in today to establish care. Ms. Klecka is originally from [facility]. She is married and has three children. 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 She is a former smoker, having quit in 2013. She denies alcohol or drug use.   Ms. Palen  has a history of hypertension. She is currently treated with amlodipine and lisinopril and is in good control. She also has hyperlipidemia and takes atorvastatin.   Ms. Krahl notes that she had always been at a normal weight up until she quit smoking. Since then she has gained ~ 80 lbs. 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 She feels this summer will be a time she can commit to exercising more regularly.   Ms. Pol has a past history of atypical chest pain. She notes she did have a prior work-up by cardiology. She does keep NTG on hand.  Ms. Joens has had a prior history of diverticulitis and is s/p partial colectomy. She notes that she occasionally has flares. She plans to add a daily fiber supplement ot her regimen. She had her last colonoscopy in 2019. She notes she is due for a 3 year follow-up colonoscopy this year and will plan to reach out to her gastroenterologist.  Past Medical History: Patient Active Problem List   Diagnosis Date Noted  . COVID-19 virus infection 10/19/2019  . Mixed hyperlipidemia 10/19/2019  . Left leg pain 10/04/2019  . Elevated glucose 10/08/2018  . Obesity (BMI 30-39.9) 03/29/2018  .  Nonspecific abnormal electrocardiogram (ECG) (EKG) 03/27/2018  . Atypical chest pain 03/27/2018  . History of hysterectomy, supracervical 02/14/2017  . Trigger ring finger of right hand 08/07/2015  . Primary osteoarthritis of first carpometacarpal joint of right hand 08/07/2015  . Diverticulitis of sigmoid colon s/p colectomy 06/30/2014 07/26/2014  . Hx of adenomatous polyp of colon 12/23/2013  . Essential hypertension 11/02/2013  . Former smoker 11/26/2008   Past Surgical History:  Procedure Laterality Date  . ABDOMINAL HYSTERECTOMY    . BREAST LUMPECTOMY  10/11   rt-neg  . BREAST SURGERY  2004   fibroademoma  . CERVICAL CONE BIOPSY  2000  . COLON RESECTION N/A 07/06/2014   Procedure: LAPAROSCOPIC LOOP ILEOSTOMY WITH PLACEMENT OF PELVIC DRAIN;  Surgeon: Donnie Mesa, MD;  Location: [facility];  Service: General;  Laterality: N/A;  . COLON SURGERY     diverticulitis   . COLONOSCOPY    . DILATION AND CURETTAGE OF UTERUS  2009  . DIVERTING ILEOSTOMY Right 06/2014  . ECTOPIC PREGNANCY SURGERY  2005   R salpyngectomy  . ILEOSTOMY N/A 10/26/2014   Procedure: LOOP ILEOSTOMY REVERSAL;  Surgeon: Donnie Mesa, MD;  Location: [facility];  Service: General;  Laterality: N/A;  . LAPAROSCOPIC SIGMOID COLECTOMY N/A 06/30/2014   Procedure: LAPAROSCOPIC ASSISTED SIGMOID COLECTOMY;  Surgeon: Donnie Mesa, MD;  Location: [facility];  Service: General;  Laterality: N/A;  . MYOMECTOMY  2008   robotic  . POLYPECTOMY  2009  . ROBOTIC ASSISTED LAP VAGINAL HYSTERECTOMY  2010   supracervical  . SALPINGECTOMY  2005  . TRIGGER FINGER RELEASE Right 09/28/2012   Procedure: RELEASE TRIGGER FINGER/A-1 PULLEY RIGHT THUMB;  Surgeon: Jolyn Nap, MD;  Location: [facility];  Service: Orthopedics;  Laterality: Right;  . ULNAR NERVE TRANSPOSITION Right 12/01/2018   Procedure: ULNAR NERVE DECOMPRESSION;  Surgeon: Daryll Brod, MD;  Location: [facility];  Service: Orthopedics;  Laterality: Right;   AXILLARY   Family History  Problem Relation Age of Onset  . Hypertension Paternal Grandmother   . Diabetes Paternal Grandmother   . Hypertension Maternal Grandmother   . Sarcoidosis Mother   . Hypertension Mother   . Hyperlipidemia Mother   . Thyroid disease Maternal Aunt   . Mental illness Cousin   . Fibroids Sister   . Colon cancer Neg Hx   . Colon polyps Neg Hx   . Esophageal cancer Neg Hx   . Rectal cancer Neg Hx   . Stomach cancer Neg Hx    Outpatient Medications Prior to Visit  Medication Sig Dispense Refill  . acetaminophen (TYLENOL) 500 MG tablet Take 500 mg by mouth every 6 (six) hours as needed for fever or moderate pain.    Marland Kitchen amLODipine (NORVASC) 5 MG tablet Take 1 tablet (5 mg total) by mouth daily. 30 tablet 0  . atorvastatin (LIPITOR) 20 MG tablet Take 1 tablet (20 mg total) by mouth daily. 30 tablet 0  . Black Cohosh 20 MG TABS Take 40 mg by mouth daily.     Marland Kitchen lisinopril (ZESTRIL) 10 MG tablet Take 1 tablet (10 mg total) by mouth daily. 30 tablet 0  . Multiple Vitamins-Minerals (HAIR/SKIN/NAILS/BIOTIN PO) Take 1 tablet by mouth daily.    . nitroGLYCERIN (NITROSTAT) 0.4 MG SL tablet Place 1 tablet (0.4 mg total) under the tongue every 5 (five) minutes as needed for up to 25 days for chest pain. 25 tablet 1  . albuterol (VENTOLIN HFA) 108 (90 Base) MCG/ACT inhaler Inhale 2 puffs into the lungs every 4 (four) hours as needed for wheezing or shortness of breath. (Patient not taking: Reported on 06/29/2020) 18 g 1  . docusate sodium (COLACE) 100 MG capsule Take 1 capsule (100 mg total) by mouth every 12 (twelve) hours. (Patient not taking: Reported on 06/29/2020) 60 capsule 0  . HYDROcodone-acetaminophen (NORCO/VICODIN) 5-325 MG tablet Take 1 tablet by mouth every 6 (six) hours as needed. (Patient not taking: Reported on 06/29/2020) 8 tablet 0  . ibuprofen (ADVIL) 200 MG tablet Take 800 mg by mouth every 6 (six) hours as needed for headache or moderate pain. (Patient not taking:  Reported on 06/29/2020)    . ondansetron (ZOFRAN ODT) 8 MG disintegrating tablet 8mg  ODT q4 hours prn nausea (Patient not taking: Reported on 06/29/2020) 4 tablet 0  . promethazine (PHENERGAN) 25 MG tablet Take 1 tablet (25 mg total) by mouth every 8 (eight) hours as needed for nausea or vomiting. (Patient not taking: Reported on 06/29/2020) 20 tablet 0   No facility-administered medications prior to visit.    Allergies  Allergen Reactions  . Keflex [Cephalexin] Anaphylaxis  . Penicillins Anaphylaxis    Has patient had a PCN reaction causing immediate rash, facial/tongue/throat swelling, SOB or lightheadedness with hypotension: Yes Has patient had a PCN reaction causing severe rash involving mucus membranes or skin necrosis: No Has patient had a PCN reaction that required hospitalization: No Has patient had a PCN reaction occurring within  the last 10 years: No If all of the above answers are "NO", then may proceed with Cephalosporin use.      Objective:   Today's Vitals   06/29/20 1509  BP: 124/78  Pulse: 97  Temp: 97.8 F (36.6 C)  TempSrc: Temporal  SpO2: 98%  Weight: 223 lb 9.6 oz (101.4 kg)  Height: 5\' 4"  (1.626 m)   Body mass index is 38.38 kg/m.   General: Well developed, well nourished. No acute distress. Psych: Alert and oriented x3. Normal mood and affect.  Health Maintenance Due  Topic Date Due  . Hepatitis C Screening  Never done  . PAP SMEAR-Modifier  06/19/2019     Assessment & Plan:   1. Essential hypertension Blood pressure is at goal. We will continue her current meds.  - amLODipine (NORVASC) 5 MG tablet; Take 1 tablet (5 mg total) by mouth daily.  Dispense: 30 tablet; Refill: 0 - lisinopril (ZESTRIL) 10 MG tablet; Take 1 tablet (10 mg total) by mouth daily.  Dispense: 30 tablet; Refill: 0  2. Hx of adenomatous polyp of colon Ms. Hinostroza is due for a 3-year interval colonoscopy. She will schedule her follow-up.  3. Obesity (BMI 30-39.9) We discussed  approaches to weight loss. Ms. Pirozzi asked about metformin. This would provide dual benefit to potentially help with mild weight loss and address potential progression of her hyperglycemia to diabetes.  - metFORMIN (GLUCOPHAGE) 500 MG tablet; Take 1 tablet (500 mg total) by mouth 2 (two) times daily with a meal.  Dispense: 180 tablet; Refill: 3  4. Elevated glucose We will check her current HbA1c. Also starting metformin.  - Hemoglobin A1c; Future - metFORMIN (GLUCOPHAGE) 500 MG tablet; Take 1 tablet (500 mg total) by mouth 2 (two) times daily with a meal.  Dispense: 180 tablet; Refill: 3  5. Mixed hyperlipidemia Due for recheck on lipids.  - Lipid panel; Future - atorvastatin (LIPITOR) 20 MG tablet; Take 1 tablet (20 mg total) by mouth daily.  Dispense: 30 tablet; Refill: 0  Ms. Mullin was asked to schedule an appointment to come in for her pap smear.   Haydee Salter, MD

## 2020-06-30 ENCOUNTER — Other Ambulatory Visit: Payer: 59

## 2020-06-30 ENCOUNTER — Ambulatory Visit: Payer: 59 | Admitting: Family Medicine

## 2020-07-04 ENCOUNTER — Encounter: Payer: Self-pay | Admitting: Family Medicine

## 2020-07-04 ENCOUNTER — Ambulatory Visit (INDEPENDENT_AMBULATORY_CARE_PROVIDER_SITE_OTHER): Payer: 59 | Admitting: Family Medicine

## 2020-07-04 ENCOUNTER — Other Ambulatory Visit: Payer: Self-pay

## 2020-07-04 ENCOUNTER — Other Ambulatory Visit (HOSPITAL_COMMUNITY)
Admission: RE | Admit: 2020-07-04 | Discharge: 2020-07-04 | Disposition: A | Discharge status: Home / Self Care | Payer: 59 | Source: Ambulatory Visit | Attending: Diagnostic Radiology | Admitting: Diagnostic Radiology

## 2020-07-04 VITALS — BP 134/82 | HR 91 | Temp 97.4°F | Ht 64.0 in | Wt 226.0 lb

## 2020-07-04 DIAGNOSIS — R7309 Other abnormal glucose: Secondary | ICD-10-CM | POA: Diagnosis not present

## 2020-07-04 DIAGNOSIS — I10 Essential (primary) hypertension: Secondary | ICD-10-CM | POA: Diagnosis not present

## 2020-07-04 DIAGNOSIS — E782 Mixed hyperlipidemia: Secondary | ICD-10-CM | POA: Diagnosis not present

## 2020-07-04 DIAGNOSIS — Z124 Encounter for screening for malignant neoplasm of cervix: Secondary | ICD-10-CM | POA: Diagnosis present

## 2020-07-04 LAB — LIPID PANEL
Cholesterol: 175 mg/dL (ref 0–200)
HDL: 60.4 mg/dL (ref >39.00)
LDL Cholesterol: 98 mg/dL (ref 0–99)
NonHDL: 114.98
Total CHOL/HDL Ratio: 3
Triglycerides: 85 mg/dL (ref 0.0–149.0)
VLDL: 17 mg/dL (ref 0.0–40.0)

## 2020-07-04 LAB — HEMOGLOBIN A1C: Hgb A1c MFr Bld: 5.9 % (ref 4.6–6.5)

## 2020-07-04 MED ORDER — AMLODIPINE BESYLATE 5 MG PO TABS: 5.0000 mg | ORAL_TABLET | Freq: Every day | ORAL | 3 refills | Status: DC

## 2020-07-04 MED ORDER — LISINOPRIL 10 MG PO TABS: 10.0000 mg | ORAL_TABLET | Freq: Every day | ORAL | 3 refills | Status: DC

## 2020-07-04 MED ORDER — ATORVASTATIN CALCIUM 20 MG PO TABS: 20.0000 mg | ORAL_TABLET | Freq: Every day | ORAL | 3 refills | Status: DC

## 2020-07-04 NOTE — Progress Notes (Addendum)
[facility] [address] [place] [address] Dept: [phone] Dept Fax: [phone]  Office Visit  Subjective:    Patient ID: [name], female    DOB: January 01, 1971, 50 y.o..   MRN: [id number]  Chief Complaint  Patient presents with  . Gynecologic Exam    Pap.      History of Present Illness:  Patient is in today for her pap smear. She was recently seen to establish care. She has had a prior supracervical hysterectomy and notes a past history of a cervical coninization for an abnormal pap. She has no complaints of vaginal discharge.  Ms. Cerveny notes that she only received a 30 day supply with no refills of her amlodipine, lisinopril and atorvastatin. She prefers 90-day supplies if possible.  Past Medical History: Patient Active Problem List   Diagnosis Date Noted  . Centrilobular emphysema ([place]) 06/29/2020  . Aortic atherosclerosis ([place]) 06/29/2020  . COVID-19 virus infection 10/19/2019  . Mixed hyperlipidemia 10/19/2019  . Left leg pain 10/04/2019  . Elevated glucose 10/08/2018  . Obesity (BMI 30-39.9) 03/29/2018  . Nonspecific abnormal electrocardiogram (ECG) (EKG) 03/27/2018  . Atypical chest pain 03/27/2018  . History of hysterectomy, supracervical 02/14/2017  . Trigger ring finger of right hand 08/07/2015  . Primary osteoarthritis of first carpometacarpal joint of right hand 08/07/2015  . Diverticulitis of sigmoid colon s/p colectomy 06/30/2014 07/26/2014  . Hx of adenomatous polyp of colon 12/23/2013  . Essential hypertension 11/02/2013  . Former smoker 11/26/2008   Past Surgical History:  Procedure Laterality Date  . ABDOMINAL HYSTERECTOMY    . BREAST LUMPECTOMY  10/11   rt-neg  . BREAST SURGERY  2004   fibroademoma  . CERVICAL CONE BIOPSY  2000  . COLON RESECTION N/A 07/06/2014   Procedure: LAPAROSCOPIC LOOP ILEOSTOMY WITH PLACEMENT OF PELVIC DRAIN;  Surgeon: Donnie Mesa, MD;  Location: [facility];  Service: General;  Laterality: N/A;  . COLON SURGERY     diverticulitis   . COLONOSCOPY    . DILATION AND CURETTAGE OF UTERUS  2009  . DIVERTING ILEOSTOMY Right 06/2014  . ECTOPIC PREGNANCY SURGERY  2005   R salpyngectomy  . ILEOSTOMY N/A 10/26/2014   Procedure: LOOP ILEOSTOMY REVERSAL;  Surgeon: Donnie Mesa, MD;  Location: [facility];  Service: General;  Laterality: N/A;  . LAPAROSCOPIC SIGMOID COLECTOMY N/A 06/30/2014   Procedure: LAPAROSCOPIC ASSISTED SIGMOID COLECTOMY;  Surgeon: Donnie Mesa, MD;  Location: [facility];  Service: General;  Laterality: N/A;  . MYOMECTOMY  2008   robotic  . POLYPECTOMY  2009  . ROBOTIC ASSISTED LAP VAGINAL HYSTERECTOMY  2010   supracervical  . SALPINGECTOMY  2005  . TRIGGER FINGER RELEASE Right 09/28/2012   Procedure: RELEASE TRIGGER FINGER/A-1 PULLEY RIGHT THUMB;  Surgeon: Jolyn Nap, MD;  Location: [facility];  Service: Orthopedics;  Laterality: Right;  . ULNAR NERVE TRANSPOSITION Right 12/01/2018   Procedure: ULNAR NERVE DECOMPRESSION;  Surgeon: Daryll Brod, MD;  Location: [facility];  Service: Orthopedics;  Laterality: Right;  AXILLARY   Family History  Problem Relation Age of Onset  . Hypertension Paternal Grandmother   . Diabetes Paternal Grandmother   . Hypertension Maternal Grandmother   . Sarcoidosis Mother   . Hypertension Mother   . Hyperlipidemia Mother   . Thyroid disease Maternal Aunt   . Mental illness Cousin   . Fibroids Sister   . Colon cancer Neg Hx   . Colon polyps Neg  Hx   . Esophageal cancer Neg Hx   . Rectal cancer Neg Hx   . Stomach cancer Neg Hx    Outpatient Medications Prior to Visit  Medication Sig Dispense Refill  . acetaminophen (TYLENOL) 500 MG tablet Take 500 mg by mouth every 6 (six) hours as needed for fever or moderate pain.    Marland Kitchen amLODipine (NORVASC) 5 MG tablet Take 1 tablet (5 mg total) by mouth daily. 30 tablet 0  . atorvastatin (LIPITOR) 20 MG tablet Take 1 tablet (20 mg  total) by mouth daily. 30 tablet 0  . Black Cohosh 20 MG TABS Take 40 mg by mouth daily.     Marland Kitchen lisinopril (ZESTRIL) 10 MG tablet Take 1 tablet (10 mg total) by mouth daily. 30 tablet 0  . metFORMIN (GLUCOPHAGE) 500 MG tablet Take 1 tablet (500 mg total) by mouth 2 (two) times daily with a meal. 180 tablet 3  . Multiple Vitamins-Minerals (HAIR/SKIN/NAILS/BIOTIN PO) Take 1 tablet by mouth daily.    . nitroGLYCERIN (NITROSTAT) 0.4 MG SL tablet Place 1 tablet (0.4 mg total) under the tongue every 5 (five) minutes as needed for up to 25 days for chest pain. 25 tablet 1   No facility-administered medications prior to visit.   Allergies  Allergen Reactions  . Keflex [Cephalexin] Anaphylaxis  . Penicillins Anaphylaxis    Has patient had a PCN reaction causing immediate rash, facial/tongue/throat swelling, SOB or lightheadedness with hypotension: Yes Has patient had a PCN reaction causing severe rash involving mucus membranes or skin necrosis: No Has patient had a PCN reaction that required hospitalization: No Has patient had a PCN reaction occurring within the last 10 years: No If all of the above answers are "NO", then may proceed with Cephalosporin use.      Objective:   Today's Vitals   07/04/20 0807  BP: 134/82  Pulse: 91  Temp: (!) 97.4 F (36.3 C)  TempSrc: Temporal  SpO2: 99%  Weight: 226 lb (102.5 kg)  Height: 5\' 4"  (1.626 m)   Body mass index is 38.79 kg/m.   General: Well developed, well nourished. No acute distress. GU: Normal external genitalia. Vaginal mucosa is pink without discharge. No anterior or posterior prolapse. Cervix is pink. No adnexal masses. Uterus is surgically absent.  Psych: Alert and oriented. Normal mood and affect.  Health Maintenance Due  Topic Date Due  . Hepatitis C Screening  Never done  . PAP SMEAR-Modifier  06/19/2019     Assessment & Plan:   1. Papanicolaou smear for cervical cancer screening Completed today. Follow-up for blood pressure  in 3 months.  - Cytology - PAP  Updated 3 prescriptions for 90-day supplies with 3 refills.  Haydee Salter, MD

## 2020-07-04 NOTE — Addendum Note (Signed)
Addended by: Haydee Salter on: 07/04/2020 10:22 AM   Modules accepted: Orders

## 2020-07-07 LAB — CYTOLOGY - PAP: Diagnosis: NEGATIVE

## 2020-07-28 ENCOUNTER — Other Ambulatory Visit: Payer: Self-pay | Admitting: Family Medicine

## 2020-07-28 ENCOUNTER — Other Ambulatory Visit: Payer: Self-pay

## 2020-07-28 ENCOUNTER — Telehealth: Payer: Self-pay

## 2020-07-28 DIAGNOSIS — E782 Mixed hyperlipidemia: Secondary | ICD-10-CM

## 2020-07-28 DIAGNOSIS — I10 Essential (primary) hypertension: Secondary | ICD-10-CM

## 2020-07-28 MED ORDER — LISINOPRIL 10 MG PO TABS: 10.0000 mg | ORAL_TABLET | Freq: Every day | ORAL | 3 refills | Status: DC

## 2020-07-28 MED ORDER — AMLODIPINE BESYLATE 5 MG PO TABS: 5.0000 mg | ORAL_TABLET | Freq: Every day | ORAL | 3 refills | Status: DC

## 2020-07-28 MED ORDER — ATORVASTATIN CALCIUM 20 MG PO TABS: 20.0000 mg | ORAL_TABLET | Freq: Every day | ORAL | 3 refills | Status: DC

## 2020-07-28 NOTE — Telephone Encounter (Signed)
Pt called and states that the pharmacy told her that we refused her amLODipine (NORVASC) 5 MG tablet, atorvastatin (LIPITOR) 20 MG tablet, and lisinopril (ZESTRIL) 10 MG tablet. Her pharmacy is still the one that is in the system. Please advise. Pt states that she is almost out of meds

## 2020-07-28 NOTE — Telephone Encounter (Signed)
Rx refills sent to the pharmacy and patient notified [facility] phone. Dm/cma

## 2020-07-28 NOTE — Telephone Encounter (Signed)
Pt called back to add that the pharmacy said that they had never received the original refills. [place], [place], [place] [address] [phone]

## 2020-08-07 ENCOUNTER — Encounter: Payer: Self-pay | Admitting: Family Medicine

## 2020-08-08 ENCOUNTER — Ambulatory Visit: Payer: 59 | Admitting: Family Medicine

## 2020-08-08 ENCOUNTER — Ambulatory Visit (INDEPENDENT_AMBULATORY_CARE_PROVIDER_SITE_OTHER): Payer: 59 | Admitting: Family Medicine

## 2020-08-08 ENCOUNTER — Other Ambulatory Visit: Payer: Self-pay

## 2020-08-08 ENCOUNTER — Encounter: Payer: Self-pay | Admitting: Family Medicine

## 2020-08-08 VITALS — BP 112/70 | HR 84 | Temp 97.9°F | Ht 64.0 in | Wt 225.2 lb

## 2020-08-08 DIAGNOSIS — M19042 Primary osteoarthritis, left hand: Secondary | ICD-10-CM | POA: Diagnosis not present

## 2020-08-08 DIAGNOSIS — M19041 Primary osteoarthritis, right hand: Secondary | ICD-10-CM

## 2020-08-08 DIAGNOSIS — G5622 Lesion of ulnar nerve, left upper limb: Secondary | ICD-10-CM | POA: Diagnosis not present

## 2020-08-08 NOTE — Progress Notes (Signed)
[facility] [address] [place] [address] Dept: [phone] Dept Fax: [phone]  Office Visit  Subjective:    Patient ID: [name], female    DOB: 07-15-1970, 50 y.o..   MRN: [id number]  Chief Complaint  Patient presents with   Follow-up    C/o having numbness/tingling in bilateral hands/arms off/on x years.  Wants to get referral for nerve conduction test.     History of Present Illness:  Patient is in today for evaluation of numbness int he left 4th and 5th finger episodically over the past year. She has a prior history of a right ulnar nerve entrapment (cubital tunnel syndrome) and underwent a right ulnar nerve transposition in 11/2018. She notes that she continues to have some issue with numbness on the right when she rests her elbow on the arms of chairs. She gets a similar issue on the left, but is also awakening with her arm feeling numb many nights. Ms. Wieczorek has a past history of a right trigger thumb and had a previous trigger thumb release (2014). She notes that her hands seem to tire and she has some achiness around the thenar area, esp. with repetitive use of the hand, such as peeling vegetables.  Past Medical History: Patient Active Problem List   Diagnosis Date Noted   Centrilobular emphysema ([place]) 06/29/2020   Aortic atherosclerosis ([place]) 06/29/2020   COVID-19 virus infection 10/19/2019   Mixed hyperlipidemia 10/19/2019   Left leg pain 10/04/2019   Elevated glucose 10/08/2018   Obesity (BMI 30-39.9) 03/29/2018   Nonspecific abnormal electrocardiogram (ECG) (EKG) 03/27/2018   Atypical chest pain 03/27/2018   History of hysterectomy, supracervical 02/14/2017   Trigger ring finger of right hand 08/07/2015   Primary osteoarthritis of first carpometacarpal joint of right hand 08/07/2015   Diverticulitis of sigmoid colon s/p colectomy 06/30/2014 07/26/2014   Hx of adenomatous polyp of colon  12/23/2013   Essential hypertension 11/02/2013   Former smoker 11/26/2008   Past Surgical History:  Procedure Laterality Date   ABDOMINAL HYSTERECTOMY     BREAST LUMPECTOMY  10/11   rt-neg   BREAST SURGERY  2004   fibroademoma   CERVICAL CONE BIOPSY  2000   COLON RESECTION N/A 07/06/2014   Procedure: LAPAROSCOPIC LOOP ILEOSTOMY WITH PLACEMENT OF PELVIC DRAIN;  Surgeon: Donnie Mesa, MD;  Location: [facility];  Service: General;  Laterality: N/A;   COLON SURGERY     diverticulitis    COLONOSCOPY     DILATION AND CURETTAGE OF UTERUS  2009   DIVERTING ILEOSTOMY Right 06/2014   ECTOPIC PREGNANCY SURGERY  2005   R salpyngectomy   ILEOSTOMY N/A 10/26/2014   Procedure: LOOP ILEOSTOMY REVERSAL;  Surgeon: Donnie Mesa, MD;  Location: [facility];  Service: General;  Laterality: N/A;   LAPAROSCOPIC SIGMOID COLECTOMY N/A 06/30/2014   Procedure: LAPAROSCOPIC ASSISTED SIGMOID COLECTOMY;  Surgeon: Donnie Mesa, MD;  Location: [facility];  Service: General;  Laterality: N/A;   MYOMECTOMY  2008   robotic   POLYPECTOMY  2009   ROBOTIC ASSISTED LAP VAGINAL HYSTERECTOMY  2010   supracervical   SALPINGECTOMY  2005   TRIGGER FINGER RELEASE Right 09/28/2012   Procedure: RELEASE TRIGGER FINGER/A-1 PULLEY RIGHT THUMB;  Surgeon: Jolyn Nap, MD;  Location: [facility];  Service: Orthopedics;  Laterality: Right;   ULNAR NERVE TRANSPOSITION Right 12/01/2018   Procedure: ULNAR NERVE DECOMPRESSION;  Surgeon: Daryll Brod, MD;  Location: [facility];  Service:  Orthopedics;  Laterality: Right;  AXILLARY   Family History  Problem Relation Age of Onset   Hypertension Paternal Grandmother    Diabetes Paternal Grandmother    Hypertension Maternal Grandmother    Sarcoidosis Mother    Hypertension Mother    Hyperlipidemia Mother    Thyroid disease Maternal Aunt    Mental illness Cousin    Fibroids Sister    Colon cancer Neg Hx    Colon polyps Neg Hx    Esophageal cancer Neg Hx    Rectal  cancer Neg Hx    Stomach cancer Neg Hx    Outpatient Medications Prior to Visit  Medication Sig Dispense Refill   acetaminophen (TYLENOL) 500 MG tablet Take 500 mg by mouth every 6 (six) hours as needed for fever or moderate pain.     amLODipine (NORVASC) 5 MG tablet Take 1 tablet (5 mg total) by mouth daily. 90 tablet 3   atorvastatin (LIPITOR) 20 MG tablet Take 1 tablet (20 mg total) by mouth daily. 90 tablet 3   Black Cohosh 20 MG TABS Take 40 mg by mouth daily.      lisinopril (ZESTRIL) 10 MG tablet Take 1 tablet (10 mg total) by mouth daily. 90 tablet 3   metFORMIN (GLUCOPHAGE) 500 MG tablet Take 1 tablet (500 mg total) by mouth 2 (two) times daily with a meal. 180 tablet 3   Multiple Vitamins-Minerals (HAIR/SKIN/NAILS/BIOTIN PO) Take 1 tablet by mouth daily.     nitroGLYCERIN (NITROSTAT) 0.4 MG SL tablet Place 1 tablet (0.4 mg total) under the tongue every 5 (five) minutes as needed for up to 25 days for chest pain. 25 tablet 1   No facility-administered medications prior to visit.   Allergies  Allergen Reactions   Keflex [Cephalexin] Anaphylaxis   Penicillins Anaphylaxis    Has patient had a PCN reaction causing immediate rash, facial/tongue/throat swelling, SOB or lightheadedness with hypotension: Yes Has patient had a PCN reaction causing severe rash involving mucus membranes or skin necrosis: No Has patient had a PCN reaction that required hospitalization: No Has patient had a PCN reaction occurring within the last 10 years: No If all of the above answers are "NO", then may proceed with Cephalosporin use.    Objective:   Today's Vitals   08/08/20 1135  BP: 112/70  Pulse: 84  Temp: 97.9 F (36.6 C)  TempSrc: Temporal  SpO2: 96%  Weight: 225 lb 3.2 oz (102.2 kg)  Height: 5\' 4"  (1.626 m)   Body mass index is 38.66 kg/m.   General: Well developed, well nourished. No acute distress. Extremities: Full ROM of upper extremities and hands. No joint swelling or tenderness.  No edema noted. Neuro: Normal sensation currently. Positive left elbow flexion test. Psych: Alert and oriented. Normal mood and affect.  Health Maintenance Due  Topic Date Due   Hepatitis C Screening  Never done   Zoster Vaccines- Shingrix (1 of 2) Never done   COVID-19 Vaccine (3 - Booster for Pfizer series) 07/21/2020     Assessment & Plan:   1. Ulnar neuropathy at elbow of left upper extremity It appears Ms. Buehner has a similar issue with left ulnar neuropathy at the elbow as she had previously on the right. I will refer her to neurology for nerve conduciton studies to determine if she might be a candidate for left ulnar nerve decompression.  - Ambulatory referral to Neurology  2. Primary osteoarthritis of both hands Ms. Lievanos hand issue appears to be primarily some  OA of the 1st MCP joint and IP joint. We reviewed use of ice when flared and avoidance of repetitive motion. Will monitor for now.  Haydee Salter, MD

## 2020-08-17 ENCOUNTER — Encounter: Payer: Self-pay | Admitting: Neurology

## 2020-09-18 ENCOUNTER — Ambulatory Visit (INDEPENDENT_AMBULATORY_CARE_PROVIDER_SITE_OTHER): Payer: 59 | Admitting: Neurology

## 2020-09-18 ENCOUNTER — Other Ambulatory Visit: Payer: Self-pay | Admitting: Family Medicine

## 2020-09-18 ENCOUNTER — Other Ambulatory Visit: Payer: Self-pay

## 2020-09-18 DIAGNOSIS — R202 Paresthesia of skin: Secondary | ICD-10-CM

## 2020-09-18 NOTE — Procedures (Signed)
[facility]  [address]  [place], [place] [address] Tel: [phone] Fax:  [phone] Test Date:  09/18/2020  Patient: [name] DOB: [birth year] Physician: Narda Amber, DO  Sex: Female Height: '5\' 4"'$  Ref Phys: Arlester Marker, MD  ID#: [id number]   Technician:    Patient Complaints: This is a 50 year old female referred for evaluation of left arm numbness and tingling.  NCV & EMG Findings: Extensive electrodiagnostic testing of the left upper extremity shows: Left median, ulnar, and mixed palmar sensory responses are within normal limits. Left median and ulnar motor responses are within normal limits.   There is no evidence of active or chronic motor axonal loss changes affecting any of the tested muscles.  Motor unit configuration and recruitment pattern is within normal limits.    Impression: This is a normal study of the left upper extremity.  In particular, there is no evidence of carpal tunnel syndrome, ulnar neuropathy, or cervical radiculopathy.   ___________________________ Narda Amber, DO    Nerve Conduction Studies Anti Sensory Summary Table   Stim Site NR Peak (ms) Norm Peak (ms) P-T Amp (V) Norm P-T Amp  Left Median Anti Sensory (2nd Digit)  33C  Wrist    2.8 <3.6 46.3 >15  Left Ulnar Anti Sensory (5th Digit)  33C  Wrist    2.7 <3.1 42.1 >10   Motor Summary Table   Stim Site NR Onset (ms) Norm Onset (ms) O-P Amp (mV) Norm O-P Amp Site1 Site2 Delta-0 (ms) Dist (cm) Vel (m/s) Norm Vel (m/s)  Left Median Motor (Abd Poll Brev)  33C  Wrist    2.1 <4.0 10.6 >6 Elbow Wrist 4.7 29.0 62 >50  Elbow    6.8  10.3         Left Ulnar Motor (Abd Dig Minimi)  33C  Wrist    2.2 <3.1 10.6 >7 B Elbow Wrist 3.2 21.0 66 >50  B Elbow    5.4  9.8  A Elbow B Elbow 1.6 10.0 63 >50  A Elbow    7.0  9.6          Comparison Summary Table   Stim Site NR Peak (ms) Norm Peak (ms) P-T Amp (V) Site1 Site2 Delta-P (ms) Norm Delta (ms)  Left  Median/Ulnar Palm Comparison (Wrist - 8cm)  33C  Median Palm    1.7 <2.2 64.0 Median Palm Ulnar Palm 0.2   Ulnar Palm    1.5 <2.2 16.1       EMG   Side Muscle Ins Act Fibs Psw Fasc Number Recrt Dur Dur. Amp Amp. Poly Poly. Comment  Left 1stDorInt Nml Nml Nml Nml Nml Nml Nml Nml Nml Nml Nml Nml N/A  Left PronatorTeres Nml Nml Nml Nml Nml Nml Nml Nml Nml Nml Nml Nml N/A  Left Biceps Nml Nml Nml Nml Nml Nml Nml Nml Nml Nml Nml Nml N/A  Left Triceps Nml Nml Nml Nml Nml Nml Nml Nml Nml Nml Nml Nml N/A  Left Deltoid Nml Nml Nml Nml Nml Nml Nml Nml Nml Nml Nml Nml N/A      Waveforms:

## 2020-09-18 NOTE — Progress Notes (Signed)
EMG order sent to Dr Gena Fray as instructed per Dr Posey Pronto for left arm

## 2020-09-20 ENCOUNTER — Other Ambulatory Visit: Payer: Self-pay

## 2020-09-20 ENCOUNTER — Ambulatory Visit (HOSPITAL_COMMUNITY)
Admission: EM | Admit: 2020-09-20 | Discharge: 2020-09-20 | Disposition: A | Discharge status: Home / Self Care | Payer: 59 | Attending: Internal Medicine | Admitting: Internal Medicine

## 2020-09-20 DIAGNOSIS — Z20822 Contact with and (suspected) exposure to covid-19: Secondary | ICD-10-CM | POA: Diagnosis present

## 2020-09-20 NOTE — ED Triage Notes (Signed)
Pt states her husband was exposed to covid-19, pt is asymptomatic.

## 2020-09-21 LAB — SARS CORONAVIRUS 2 (TAT 6-24 HRS): SARS Coronavirus 2: NEGATIVE

## 2020-09-29 ENCOUNTER — Other Ambulatory Visit: Payer: Self-pay | Admitting: Obstetrics and Gynecology

## 2020-09-29 ENCOUNTER — Other Ambulatory Visit: Payer: Self-pay | Admitting: Family Medicine

## 2020-09-29 DIAGNOSIS — Z1231 Encounter for screening mammogram for malignant neoplasm of breast: Secondary | ICD-10-CM

## 2020-10-04 ENCOUNTER — Ambulatory Visit: Payer: 59

## 2020-10-09 ENCOUNTER — Other Ambulatory Visit: Payer: Self-pay

## 2020-10-09 ENCOUNTER — Encounter: Payer: Self-pay | Admitting: Family Medicine

## 2020-10-09 ENCOUNTER — Ambulatory Visit (INDEPENDENT_AMBULATORY_CARE_PROVIDER_SITE_OTHER): Payer: 59 | Admitting: Family Medicine

## 2020-10-09 VITALS — BP 110/56 | HR 96 | Temp 96.9°F | Ht 64.0 in | Wt 224.0 lb

## 2020-10-09 DIAGNOSIS — I10 Essential (primary) hypertension: Secondary | ICD-10-CM

## 2020-10-09 DIAGNOSIS — R7303 Prediabetes: Secondary | ICD-10-CM

## 2020-10-09 DIAGNOSIS — Z23 Encounter for immunization: Secondary | ICD-10-CM | POA: Diagnosis not present

## 2020-10-09 DIAGNOSIS — E782 Mixed hyperlipidemia: Secondary | ICD-10-CM

## 2020-10-09 DIAGNOSIS — R300 Dysuria: Secondary | ICD-10-CM

## 2020-10-09 DIAGNOSIS — G5622 Lesion of ulnar nerve, left upper limb: Secondary | ICD-10-CM

## 2020-10-09 LAB — POCT URINALYSIS DIPSTICK
Bilirubin, UA: 1
Blood, UA: NEGATIVE
Glucose, UA: NEGATIVE
Ketones, UA: 5
Nitrite, UA: NEGATIVE
Protein, UA: POSITIVE — AB
Spec Grav, UA: >=1.03 — AB (ref 1.010–1.025)
Urobilinogen, UA: 1 E.U./dL
pH, UA: 5.5 (ref 5.0–8.0)

## 2020-10-09 NOTE — Progress Notes (Signed)
urin

## 2020-10-09 NOTE — Progress Notes (Signed)
[facility] [address] [place] [address] Dept: [phone] Dept Fax: [phone]  Chronic Care Office Visit  Subjective:    Patient ID: [name], female    DOB: 10/16/1970, 50 y.o..   MRN: [id number]  Chief Complaint  Patient presents with   Follow-up    3 mo f/u. Pt c/o UTI symptoms since Tuesday 10/03/2020    History of Present Illness:  Patient is in today for reassessment of chronic medical issues.  Ms. Boy has a history of hypertension, managed with amlodipine and lisinopril. She has hyperlipidemia, manages with atorvastatin. She has also had prediabetes. She feels these are currently doing well.  Ms. Sierzega notes  a 3-4 day history of right lower quadrant discomfort. She thought this may be related to her prior history of diverticulitis, but she had also had some mild dysuria and low back pain.  At her last visit, Ms. Munzer was having some symptoms of a left ulnar neuropathy. She was seen by Dr. Posey Pronto (neurology) and found to have normal NCS. Despite this, she still notes symptoms when she rests her elbow on chair arms for any length of time.  Past Medical History: Patient Active Problem List   Diagnosis Date Noted   Centrilobular emphysema ([place]) 06/29/2020   Aortic atherosclerosis ([place]) 06/29/2020   COVID-19 virus infection 10/19/2019   Mixed hyperlipidemia 10/19/2019   Left leg pain 10/04/2019   Prediabetes 10/08/2018   Obesity (BMI 30-39.9) 03/29/2018   Nonspecific abnormal electrocardiogram (ECG) (EKG) 03/27/2018   Atypical chest pain 03/27/2018   History of hysterectomy, supracervical 02/14/2017   Trigger ring finger of right hand 08/07/2015   Primary osteoarthritis of first carpometacarpal joint of right hand 08/07/2015   Diverticulitis of sigmoid colon s/p colectomy 06/30/2014 07/26/2014   Hx of adenomatous polyp of colon 12/23/2013   Essential hypertension 11/02/2013   Former smoker  11/26/2008   Past Surgical History:  Procedure Laterality Date   ABDOMINAL HYSTERECTOMY     BREAST LUMPECTOMY  10/11   rt-neg   BREAST SURGERY  2004   fibroademoma   CERVICAL CONE BIOPSY  2000   COLON RESECTION N/A 07/06/2014   Procedure: LAPAROSCOPIC LOOP ILEOSTOMY WITH PLACEMENT OF PELVIC DRAIN;  Surgeon: Donnie Mesa, MD;  Location: [place];  Service: General;  Laterality: N/A;   COLON SURGERY     diverticulitis    COLONOSCOPY     DILATION AND CURETTAGE OF UTERUS  2009   DIVERTING ILEOSTOMY Right 06/2014   ECTOPIC PREGNANCY SURGERY  2005   R salpyngectomy   ILEOSTOMY N/A 10/26/2014   Procedure: LOOP ILEOSTOMY REVERSAL;  Surgeon: Donnie Mesa, MD;  Location: [facility];  Service: General;  Laterality: N/A;   LAPAROSCOPIC SIGMOID COLECTOMY N/A 06/30/2014   Procedure: LAPAROSCOPIC ASSISTED SIGMOID COLECTOMY;  Surgeon: Donnie Mesa, MD;  Location: [facility];  Service: General;  Laterality: N/A;   MYOMECTOMY  2008   robotic   POLYPECTOMY  2009   ROBOTIC ASSISTED LAP VAGINAL HYSTERECTOMY  2010   supracervical   SALPINGECTOMY  2005   TRIGGER FINGER RELEASE Right 09/28/2012   Procedure: RELEASE TRIGGER FINGER/A-1 PULLEY RIGHT THUMB;  Surgeon: Jolyn Nap, MD;  Location: [place];  Service: Orthopedics;  Laterality: Right;   ULNAR NERVE TRANSPOSITION Right 12/01/2018   Procedure: ULNAR NERVE DECOMPRESSION;  Surgeon: Daryll Brod, MD;  Location: [place] [facility];  Service: Orthopedics;  Laterality: Right;  AXILLARY   Family History  Problem Relation Age  of Onset   Hypertension Paternal Grandmother    Diabetes Paternal Grandmother    Hypertension Maternal Grandmother    Sarcoidosis Mother    Hypertension Mother    Hyperlipidemia Mother    Thyroid disease Maternal Aunt    Mental illness Cousin    Fibroids Sister    Colon cancer Neg Hx    Colon polyps Neg Hx    Esophageal cancer Neg Hx    Rectal cancer Neg Hx    Stomach cancer Neg Hx    Outpatient Medications  Prior to Visit  Medication Sig Dispense Refill   acetaminophen (TYLENOL) 500 MG tablet Take 500 mg by mouth every 6 (six) hours as needed for fever or moderate pain.     amLODipine (NORVASC) 5 MG tablet Take 1 tablet (5 mg total) by mouth daily. 90 tablet 3   atorvastatin (LIPITOR) 20 MG tablet Take 1 tablet (20 mg total) by mouth daily. 90 tablet 3   Black Cohosh 20 MG TABS Take 40 mg by mouth daily.      lisinopril (ZESTRIL) 10 MG tablet Take 1 tablet (10 mg total) by mouth daily. 90 tablet 3   metFORMIN (GLUCOPHAGE) 500 MG tablet Take 1 tablet (500 mg total) by mouth 2 (two) times daily with a meal. 180 tablet 3   Multiple Vitamins-Minerals (HAIR/SKIN/NAILS/BIOTIN PO) Take 1 tablet by mouth daily.     nitroGLYCERIN (NITROSTAT) 0.4 MG SL tablet Place 1 tablet (0.4 mg total) under the tongue every 5 (five) minutes as needed for up to 25 days for chest pain. 25 tablet 1   No facility-administered medications prior to visit.   Allergies  Allergen Reactions   Keflex [Cephalexin] Anaphylaxis   Penicillins Anaphylaxis    Has patient had a PCN reaction causing immediate rash, facial/tongue/throat swelling, SOB or lightheadedness with hypotension: Yes Has patient had a PCN reaction causing severe rash involving mucus membranes or skin necrosis: No Has patient had a PCN reaction that required hospitalization: No Has patient had a PCN reaction occurring within the last 10 years: No If all of the above answers are "NO", then may proceed with Cephalosporin use.     Objective:   Today's Vitals   10/09/20 0833  BP: (!) 110/56  Pulse: 96  Temp: (!) 96.9 F (36.1 C)  TempSrc: Temporal  SpO2: 96%  Weight: 224 lb (101.6 kg)  Height: '5\' 4"'$  (1.626 m)   Body mass index is 38.45 kg/m.   General: Well developed, well nourished. No acute distress. Psych: Alert and oriented. Normal mood and affect.  Health Maintenance Due  Topic Date Due   Pneumococcal Vaccine 88-32 Years old (1 - PCV) Never  done   Hepatitis C Screening  Never done   Zoster Vaccines- Shingrix (1 of 2) Never done   COVID-19 Vaccine (3 - Booster for Pfizer series) 07/21/2020   INFLUENZA VACCINE  09/18/2020   Lab Results Lab Results  Component Value Date   CHOL 175 07/04/2020   HDL 60.40 07/04/2020   LDLCALC 98 07/04/2020   LDLDIRECT 92.0 10/04/2019   TRIG 85.0 07/04/2020   CHOLHDL 3 07/04/2020   Lab Results  Component Value Date   HGBA1C 5.9 07/04/2020   Urine dipstick shows positive for protein, positive for leukocytes, and positive for ketones.  Negative for nitrite. Micro exam: pending   Assessment & Plan:   1. Essential hypertension Blood pressure in control. Continue amlodipine and lisinopril.  2. Mixed hyperlipidemia Lipids at goal, continue atorvastatin.  3. Prediabetes  Stable. Continue metformin. Patient having occasional nausea. If does not tolerate, we might consider the extended release formulation.  4. Dysuria I will send the UA for microscopic exam. It appears she is mildly dehydrated. I recommend she push fluids. Pain may be related to diverticular disease. Recommend she follow-up if fever or increased pain occurs.  - POCT Urinalysis Dipstick - Urinalysis w microscopic + reflex cultur  5. Ulnar neuropathy at elbow of left upper extremity We discussed that this may be mild and not apparent on NCS. I recommend she use elbow pads when sitting with her elbows resting on a surface. She can use periodic NSAIDs for this.  6. Need for shingles vaccine  - Varicella-zoster vaccine IM (Shingrix)   Haydee Salter, MD

## 2020-10-10 LAB — URINALYSIS W MICROSCOPIC + REFLEX CULTURE
Bilirubin Urine: NEGATIVE
Glucose, UA: NEGATIVE
Hgb urine dipstick: NEGATIVE
Leukocyte Esterase: NEGATIVE
Nitrites, Initial: NEGATIVE
Specific Gravity, Urine: 1.03 (ref 1.001–1.035)
WBC, UA: NONE SEEN /HPF (ref 0–5)
pH: 5.5 (ref 5.0–8.0)

## 2020-10-10 LAB — NO CULTURE INDICATED

## 2020-11-21 ENCOUNTER — Ambulatory Visit: Payer: 59

## 2020-11-30 ENCOUNTER — Ambulatory Visit: Payer: 59 | Admitting: Physician Assistant

## 2020-12-11 ENCOUNTER — Ambulatory Visit: Payer: 59 | Admitting: Physician Assistant

## 2021-01-05 ENCOUNTER — Ambulatory Visit (INDEPENDENT_AMBULATORY_CARE_PROVIDER_SITE_OTHER): Payer: 59 | Admitting: Family Medicine

## 2021-01-05 ENCOUNTER — Other Ambulatory Visit: Payer: Self-pay

## 2021-01-05 VITALS — BP 118/80 | HR 95 | Temp 97.3°F | Ht 64.0 in | Wt 225.4 lb

## 2021-01-05 DIAGNOSIS — K0889 Other specified disorders of teeth and supporting structures: Secondary | ICD-10-CM | POA: Diagnosis not present

## 2021-01-05 DIAGNOSIS — M1712 Unilateral primary osteoarthritis, left knee: Secondary | ICD-10-CM

## 2021-01-05 NOTE — Progress Notes (Signed)
[facility] [address] [place] [address] Dept: [phone] Dept Fax: [phone]  Office Visit  Subjective:    Patient ID: [name], female    DOB: March 16, 1970, 50 y.o..   MRN: [id number]  Chief Complaint  Patient presents with   Acute Visit    C/o having LT knee stiffness/pain x 2-3 months and pin in the upper jaw/LT ear x 1 week.  She had dental surgery 1 week ago.        History of Present Illness:  Patient is in today for evaluation of ongoing and progressive left knee pain. [name] has a known history of OA of the left knee. She has noted this esp. bothers her at night when in the bed. She gets some medial popping in the joint. She notes marked stiffness and a heavy sensation posteriorly.  [name] had 2 dental extractions on 11/7 (upper and lower molars on the left). She was initially managed with oxycodone for pain. She notes she has been having throbbing/aching in her left jaw/cheek. She also feels some left ear pain. She went back to the dentist earlier this week and was given am anesthetic pack for the lower extraction site. This helped a little, but came out the next day. She was concerned that she might have an ear infection.  Past Medical History: Patient Active Problem List   Diagnosis Date Noted   Centrilobular emphysema ([place]) 06/29/2020   Aortic atherosclerosis ([place]) 06/29/2020   COVID-19 virus infection 10/19/2019   Mixed hyperlipidemia 10/19/2019   Left leg pain 10/04/2019   Prediabetes 10/08/2018   Obesity (BMI 30-39.9) 03/29/2018   Nonspecific abnormal electrocardiogram (ECG) (EKG) 03/27/2018   Atypical chest pain 03/27/2018   History of hysterectomy, supracervical 02/14/2017   Trigger ring finger of right hand 08/07/2015   Primary osteoarthritis of first carpometacarpal joint of right hand 08/07/2015   Diverticulitis of sigmoid colon s/p colectomy 06/30/2014 07/26/2014   Hx of  adenomatous polyp of colon 12/23/2013   Essential hypertension 11/02/2013   Former smoker 11/26/2008   Past Surgical History:  Procedure Laterality Date   ABDOMINAL HYSTERECTOMY     BREAST LUMPECTOMY  10/11   rt-neg   BREAST SURGERY  2004   fibroademoma   CERVICAL CONE BIOPSY  2000   COLON RESECTION N/A 07/06/2014   Procedure: LAPAROSCOPIC LOOP ILEOSTOMY WITH PLACEMENT OF PELVIC DRAIN;  Surgeon: Donnie Mesa, MD;  Location: [facility];  Service: General;  Laterality: N/A;   COLON SURGERY     diverticulitis    COLONOSCOPY     DILATION AND CURETTAGE OF UTERUS  2009   DIVERTING ILEOSTOMY Right 06/2014   ECTOPIC PREGNANCY SURGERY  2005   R salpyngectomy   ILEOSTOMY N/A 10/26/2014   Procedure: LOOP ILEOSTOMY REVERSAL;  Surgeon: Donnie Mesa, MD;  Location: [facility];  Service: General;  Laterality: N/A;   LAPAROSCOPIC SIGMOID COLECTOMY N/A 06/30/2014   Procedure: LAPAROSCOPIC ASSISTED SIGMOID COLECTOMY;  Surgeon: Donnie Mesa, MD;  Location: [facility];  Service: General;  Laterality: N/A;   MYOMECTOMY  2008   robotic   POLYPECTOMY  2009   ROBOTIC ASSISTED LAP VAGINAL HYSTERECTOMY  2010   supracervical   SALPINGECTOMY  2005   TRIGGER FINGER RELEASE Right 09/28/2012   Procedure: RELEASE TRIGGER FINGER/A-1 PULLEY RIGHT THUMB;  Surgeon: Jolyn Nap, MD;  Location: [facility];  Service: Orthopedics;  Laterality: Right;   ULNAR NERVE TRANSPOSITION Right 12/01/2018   Procedure: ULNAR  NERVE DECOMPRESSION;  Surgeon: Daryll Brod, MD;  Location: [facility];  Service: Orthopedics;  Laterality: Right;  AXILLARY   Family History  Problem Relation Age of Onset   Hypertension Paternal Grandmother    Diabetes Paternal Grandmother    Hypertension Maternal Grandmother    Sarcoidosis Mother    Hypertension Mother    Hyperlipidemia Mother    Thyroid disease Maternal Aunt    Mental illness Cousin    Fibroids Sister    Colon cancer Neg Hx    Colon polyps Neg Hx    Esophageal  cancer Neg Hx    Rectal cancer Neg Hx    Stomach cancer Neg Hx    Outpatient Medications Prior to Visit  Medication Sig Dispense Refill   acetaminophen (TYLENOL) 500 MG tablet Take 500 mg by mouth every 6 (six) hours as needed for fever or moderate pain.     amLODipine (NORVASC) 5 MG tablet Take 1 tablet (5 mg total) by mouth daily. 90 tablet 3   atorvastatin (LIPITOR) 20 MG tablet Take 1 tablet (20 mg total) by mouth daily. 90 tablet 3   Black Cohosh 20 MG TABS Take 40 mg by mouth daily.      lisinopril (ZESTRIL) 10 MG tablet Take 1 tablet (10 mg total) by mouth daily. 90 tablet 3   metFORMIN (GLUCOPHAGE) 500 MG tablet Take 1 tablet (500 mg total) by mouth 2 (two) times daily with a meal. 180 tablet 3   Multiple Vitamins-Minerals (HAIR/SKIN/NAILS/BIOTIN PO) Take 1 tablet by mouth daily.     nitroGLYCERIN (NITROSTAT) 0.4 MG SL tablet Place 1 tablet (0.4 mg total) under the tongue every 5 (five) minutes as needed for up to 25 days for chest pain. 25 tablet 1   No facility-administered medications prior to visit.   Allergies  Allergen Reactions   Keflex [Cephalexin] Anaphylaxis   Penicillins Anaphylaxis    Has patient had a PCN reaction causing immediate rash, facial/tongue/throat swelling, SOB or lightheadedness with hypotension: Yes Has patient had a PCN reaction causing severe rash involving mucus membranes or skin necrosis: No Has patient had a PCN reaction that required hospitalization: No Has patient had a PCN reaction occurring within the last 10 years: No If all of the above answers are "NO", then may proceed with Cephalosporin use.      Objective:   Today's Vitals   01/05/21 1103  BP: 118/80  Pulse: 95  Temp: (!) 97.3 F (36.3 C)  TempSrc: Temporal  SpO2: 96%  Weight: 225 lb 6.4 oz (102.2 kg)  Height: 5\' 4"  (1.626 m)   Body mass index is 38.69 kg/m.   General: Well developed, well nourished. No acute distress. HEENT: Normocephalic, non-traumatic. Left external ear.  Left EAC and TM normal. The extraction site in the left upper jaw is   healing well. The extraction site in the left lower jaw shows some moderate swelling and mild redness. There is no drainage   from either site. Extremities: Moderate crepitance tot he left knee. Good alignment and no significant swelling. Psych: Alert and oriented. Normal mood and affect.  Health Maintenance Due  Topic Date Due   Pneumococcal Vaccine 81-66 Years old (1 - PCV) Never done   Hepatitis C Screening  Never done   COVID-19 Vaccine (3 - Booster for Pfizer series) 04/17/2020   INFLUENZA VACCINE  09/18/2020   Zoster Vaccines- Shingrix (2 of 2) 12/04/2020     Assessment & Plan:   1. Pain, dental I reassured  [name] that there was no visible sign of an ear infection. The dental extraction sites appear to be healing well. I recommend she use ibuprofen 200 mg, 3 tabs (600 mg total) and  500 mg of acetaminophen qid for the next 1-2 days. She should consider with warm salt water rinses and avoid chewing on the left. Follow-up with her dentist as needed.  2. Primary osteoarthritis of left knee I recommend referral to sports medicine for possible knee joint injection for pain management.  - Ambulatory referral to Sports Medicine  Haydee Salter, MD

## 2021-01-05 NOTE — Patient Instructions (Signed)
Use ibuprofen 200 mg, 3 tabs and acetaminophen 500 mg every 6 hours for dental pain.

## 2021-01-15 ENCOUNTER — Ambulatory Visit (INDEPENDENT_AMBULATORY_CARE_PROVIDER_SITE_OTHER): Payer: 59 | Admitting: Family Medicine

## 2021-01-15 ENCOUNTER — Encounter: Payer: Self-pay | Admitting: Family Medicine

## 2021-01-15 ENCOUNTER — Other Ambulatory Visit: Payer: Self-pay

## 2021-01-15 VITALS — BP 124/78 | HR 94 | Temp 96.3°F | Wt 223.6 lb

## 2021-01-15 DIAGNOSIS — M1712 Unilateral primary osteoarthritis, left knee: Secondary | ICD-10-CM

## 2021-01-15 DIAGNOSIS — E782 Mixed hyperlipidemia: Secondary | ICD-10-CM

## 2021-01-15 DIAGNOSIS — R7303 Prediabetes: Secondary | ICD-10-CM | POA: Diagnosis not present

## 2021-01-15 DIAGNOSIS — Z1159 Encounter for screening for other viral diseases: Secondary | ICD-10-CM

## 2021-01-15 DIAGNOSIS — Z23 Encounter for immunization: Secondary | ICD-10-CM | POA: Diagnosis not present

## 2021-01-15 DIAGNOSIS — I10 Essential (primary) hypertension: Secondary | ICD-10-CM

## 2021-01-15 DIAGNOSIS — K0889 Other specified disorders of teeth and supporting structures: Secondary | ICD-10-CM

## 2021-01-15 NOTE — Progress Notes (Signed)
[facility] [address] [place] [address] Dept: [phone] Dept Fax: [phone]  Chronic Care Office Visit  Subjective:    Patient ID: [name], female    DOB: 09/28/1970, 50 y.o..   MRN: [id number]  Chief Complaint  Patient presents with   Follow-up    3 month f/u HTN/DM/Chol.  Not fasting today.  C/o left knee pain not getting better    History of Present Illness:  Patient is in today for reassessment of chronic medical issues.  [name] has a history of hypertension, managed with amlodipine and lisinopril.   [name] has hyperlipidemia, managed with atorvastatin.   [name] has prediabetes. She has questions in regards to appropriate nutrition and asks about seeing a nutritionist for counseling. She is concerned about her overall health and her weight.   [name] recently had dental extractions. I had seen her on 11/18 with dental pain. She notes this is doing much better at this point.   [name] has been having left knee pain recently. She has a known history of OA of the left knee. She has noted this esp. bothers her at night when in the bed. She gets some medial popping in the joint. She notes marked stiffness and a heavy sensation posteriorly. I had recently referred her to Sports Medicine. She has an appointment with them on Thursday.  Past Medical History: Patient Active Problem List   Diagnosis Date Noted   Centrilobular emphysema ([place]) 06/29/2020   Aortic atherosclerosis ([place]) 06/29/2020   COVID-19 virus infection 10/19/2019   Mixed hyperlipidemia 10/19/2019   Left leg pain 10/04/2019   Prediabetes 10/08/2018   Obesity (BMI 30-39.9) 03/29/2018   Nonspecific abnormal electrocardiogram (ECG) (EKG) 03/27/2018   Atypical chest pain 03/27/2018   History of hysterectomy, supracervical 02/14/2017   Trigger ring finger of right hand 08/07/2015   Primary osteoarthritis of first  carpometacarpal joint of right hand 08/07/2015   Diverticulitis of sigmoid colon s/p colectomy 06/30/2014 07/26/2014   Hx of adenomatous polyp of colon 12/23/2013   Essential hypertension 11/02/2013   Former smoker 11/26/2008   Past Surgical History:  Procedure Laterality Date   ABDOMINAL HYSTERECTOMY     BREAST LUMPECTOMY  10/11   rt-neg   BREAST SURGERY  2004   fibroademoma   CERVICAL CONE BIOPSY  2000   COLON RESECTION N/A 07/06/2014   Procedure: LAPAROSCOPIC LOOP ILEOSTOMY WITH PLACEMENT OF PELVIC DRAIN;  Surgeon: Donnie Mesa, MD;  Location: [facility];  Service: General;  Laterality: N/A;   COLON SURGERY     diverticulitis    COLONOSCOPY     DILATION AND CURETTAGE OF UTERUS  2009   DIVERTING ILEOSTOMY Right 06/2014   ECTOPIC PREGNANCY SURGERY  2005   R salpyngectomy   ILEOSTOMY N/A 10/26/2014   Procedure: LOOP ILEOSTOMY REVERSAL;  Surgeon: Donnie Mesa, MD;  Location: [facility];  Service: General;  Laterality: N/A;   LAPAROSCOPIC SIGMOID COLECTOMY N/A 06/30/2014   Procedure: LAPAROSCOPIC ASSISTED SIGMOID COLECTOMY;  Surgeon: Donnie Mesa, MD;  Location: [facility];  Service: General;  Laterality: N/A;   MYOMECTOMY  2008   robotic   POLYPECTOMY  2009   ROBOTIC ASSISTED LAP VAGINAL HYSTERECTOMY  2010   supracervical   SALPINGECTOMY  2005   TRIGGER FINGER RELEASE Right 09/28/2012   Procedure: RELEASE TRIGGER FINGER/A-1 PULLEY RIGHT THUMB;  Surgeon: Jolyn Nap, MD;  Location: [facility];  Service: Orthopedics;  Laterality: Right;  ULNAR NERVE TRANSPOSITION Right 12/01/2018   Procedure: ULNAR NERVE DECOMPRESSION;  Surgeon: Daryll Brod, MD;  Location: [facility];  Service: Orthopedics;  Laterality: Right;  AXILLARY   Family History  Problem Relation Age of Onset   Hypertension Paternal Grandmother    Diabetes Paternal Grandmother    Hypertension Maternal Grandmother    Sarcoidosis Mother    Hypertension Mother    Hyperlipidemia Mother    Thyroid  disease Maternal Aunt    Mental illness Cousin    Fibroids Sister    Colon cancer Neg Hx    Colon polyps Neg Hx    Esophageal cancer Neg Hx    Rectal cancer Neg Hx    Stomach cancer Neg Hx     Outpatient Medications Prior to Visit  Medication Sig Dispense Refill   acetaminophen (TYLENOL) 500 MG tablet Take 500 mg by mouth every 6 (six) hours as needed for fever or moderate pain.     amLODipine (NORVASC) 5 MG tablet Take 1 tablet (5 mg total) by mouth daily. 90 tablet 3   atorvastatin (LIPITOR) 20 MG tablet Take 1 tablet (20 mg total) by mouth daily. 90 tablet 3   Black Cohosh 20 MG TABS Take 40 mg by mouth daily.      lisinopril (ZESTRIL) 10 MG tablet Take 1 tablet (10 mg total) by mouth daily. 90 tablet 3   metFORMIN (GLUCOPHAGE) 500 MG tablet Take 1 tablet (500 mg total) by mouth 2 (two) times daily with a meal. 180 tablet 3   Multiple Vitamins-Minerals (HAIR/SKIN/NAILS/BIOTIN PO) Take 1 tablet by mouth daily.     nitroGLYCERIN (NITROSTAT) 0.4 MG SL tablet Place 1 tablet (0.4 mg total) under the tongue every 5 (five) minutes as needed for up to 25 days for chest pain. 25 tablet 1   No facility-administered medications prior to visit.   Allergies  Allergen Reactions   Keflex [Cephalexin] Anaphylaxis   Penicillins Anaphylaxis    Has patient had a PCN reaction causing immediate rash, facial/tongue/throat swelling, SOB or lightheadedness with hypotension: Yes Has patient had a PCN reaction causing severe rash involving mucus membranes or skin necrosis: No Has patient had a PCN reaction that required hospitalization: No Has patient had a PCN reaction occurring within the last 10 years: No If all of the above answers are "NO", then may proceed with Cephalosporin use.     Objective:   Today's Vitals   01/15/21 0901  BP: 124/78  Pulse: 94  Temp: (!) 96.3 F (35.7 C)  SpO2: 97%  Weight: 223 lb 9.6 oz (101.4 kg)   Body mass index is 38.38 kg/m.   General: Well developed, well  nourished. No acute distress. HEENT: Normocephalic, non-traumatic. The extraction site int he lower left mandible is healing   well. Less redness and swelling than last week. Extremities: Mild swelling noted to the left knee. Increased warmth compared to the right. Skin: Warm and dry. No rashes. Psych: Alert and oriented. Normal mood and affect.  Health Maintenance Due  Topic Date Due   Pneumococcal Vaccine 54-24 Years old (1 - PCV) Never done   Hepatitis C Screening  Never done   COVID-19 Vaccine (3 - Booster for Pfizer series) 04/17/2020   Zoster Vaccines- Shingrix (2 of 2) 12/04/2020     Lab Results: Lab Results  Component Value Date   HGBA1C 5.9 07/04/2020   Assessment & Plan:   1. Essential hypertension Blood pressure at goal. Continue amlodipine and lisinopril.  2. Prediabetes  I will refer Ms. Pfalzgraf for nutritional counseling related to her prediabetes.  - Ambulatory referral to diabetic education  3. Mixed hyperlipidemia Due for repeat lipids. She will return for fasting lipids.  - Lipid panel, Future  4. Primary osteoarthritis of left knee Scheduled with Sports Medicine for Dec. 1.  5. Pain, dental Improving.  6. Encounter for hepatitis C screening test for low risk patient  - HCV Ab w Reflex to Quant PCR, Future  7. Need for shingles vaccine  - Varicella-zoster vaccine IM (Shingrix)  Haydee Salter, MD

## 2021-01-17 NOTE — Progress Notes (Signed)
Subjective:    CC: L knee pain  I, [name], LAT, ATC, am serving as scribe for Dr. Lynne [name].  HPI: Pt is a 50 y/o female presenting w/ L knee pain ongoing since last year, worsening over the last few months.  She has a hx of knee OA.  Pt c/o stiffness and a feeling of "heaviness" over the posterior aspect. She locates her pain to anterior aspect, slightly medially and laterally.  L knee swelling: yes L knee mechanical symptoms: yes, popping in her medial knee Aggravating factors: stairs, walking Treatments tried: ice, tylenol, IBU,    Pertinent review of Systems: No fevers or chills  Relevant historical information: Hypertension.   Objective:    Vitals:   01/18/21 1028  BP: 126/80  Pulse: 93  SpO2: 96%   General: Well Developed, well nourished, and in no acute distress.   MSK: Left knee moderate effusion normal-appearing otherwise. Range of motion 0-100 degrees. Tender palpation medial joint line. Intact strength pain with extension. Stable ligamentous exam. Positive medial McMurray's test. Pulses capillary refill and sensation are intact distally  Lab and Radiology Results  Procedure: Real-time Ultrasound Guided Injection of knee superior lateral patellar space Device: Philips Affiniti 50G Images permanently stored and available for review in PACS Ultrasound evaluation prior to injection reveals moderate joint effusion.  Narrowed medial joint line with partially extruded medial meniscus.  Small Baker's cyst. Verbal informed consent obtained.  Discussed risks and benefits of procedure. Warned about infection bleeding damage to structures skin hypopigmentation and fat atrophy among others. Patient expresses understanding and agreement Time-out conducted.   Noted no overlying erythema, induration, or other signs of local infection.   Skin prepped in a sterile fashion.   Local anesthesia: Topical Ethyl chloride.   With sterile technique and under real  time ultrasound guidance: 40 mg of Kenalog and 2 mL of Marcaine injected into knee joint. Fluid seen entering the joint capsule.   Completed without difficulty   Pain immediately resolved suggesting accurate placement of the medication.   Advised to call if fevers/chills, erythema, induration, drainage, or persistent bleeding.   Images permanently stored and available for review in the ultrasound unit.  Impression: Technically successful ultrasound guided injection.    X-ray images left knee obtained today personally and independently interpreted Mild medial compartment DJD.  No acute fractures. Await formal radiology review    Impression and Recommendations:    Assessment and Plan: 50 y.o. female with left knee pain and effusion.  Pain thought to be due to exacerbation of DJD as well as a degenerative medial meniscus tear.   Plan:. Short-term: Steroid injection and Voltaren gel.  Medium term: PT working on Forensic scientist.  Long-term: Weight loss in preparation for potential knee replacement in the medium to distant future.  Patient has already been referred to registered dietitian by her PCP.  Also would recommend [place] healthy weight and wellness in the future.  BMI needs to be under 40 for knee replacement.  Weight loss will help knee pain as well.  Recheck in about 6 weeks.  PDMP not reviewed this encounter. Orders Placed This Encounter  Procedures   [place] LIMITED JOINT SPACE STRUCTURES LOW LEFT(NO LINKED CHARGES)    Standing Status:   Future    Number of Occurrences:   1    Standing Expiration Date:   07/19/2021    Order Specific Question:   Reason for Exam (SYMPTOM  OR DIAGNOSIS REQUIRED)    Answer:  left knee pain    Order Specific Question:   Preferred imaging location?    Answer:   [facility]   DG Knee AP/LAT W/[place] Left    Standing Status:   Future    Number of Occurrences:   1    Standing Expiration Date:   01/18/2022    Order  Specific Question:   Reason for Exam (SYMPTOM  OR DIAGNOSIS REQUIRED)    Answer:   left knee pain    Order Specific Question:   Preferred imaging location?    Answer:   [facility]    Order Specific Question:   Is patient pregnant?    Answer:   No   Ambulatory referral to Physical Therapy    Referral Priority:   Routine    Referral Type:   Physical Medicine    Referral Reason:   Specialty Services Required    Requested Specialty:   Physical Therapy    Number of Visits Requested:   1   No orders of the defined types were placed in this encounter.   Discussed warning signs or symptoms. Please see discharge instructions. Patient expresses understanding.   The above documentation has been reviewed and is accurate and complete [name], M.D.

## 2021-01-18 ENCOUNTER — Other Ambulatory Visit: Payer: Self-pay | Admitting: Family Medicine

## 2021-01-18 ENCOUNTER — Ambulatory Visit (INDEPENDENT_AMBULATORY_CARE_PROVIDER_SITE_OTHER): Payer: 59

## 2021-01-18 ENCOUNTER — Ambulatory Visit (INDEPENDENT_AMBULATORY_CARE_PROVIDER_SITE_OTHER): Payer: 59 | Admitting: Family Medicine

## 2021-01-18 ENCOUNTER — Other Ambulatory Visit: Payer: Self-pay

## 2021-01-18 ENCOUNTER — Ambulatory Visit: Payer: Self-pay

## 2021-01-18 ENCOUNTER — Other Ambulatory Visit: Payer: Self-pay | Admitting: Cardiology

## 2021-01-18 VITALS — BP 126/80 | HR 93 | Ht 64.0 in | Wt 223.4 lb

## 2021-01-18 DIAGNOSIS — M25562 Pain in left knee: Secondary | ICD-10-CM | POA: Diagnosis not present

## 2021-01-18 DIAGNOSIS — G8929 Other chronic pain: Secondary | ICD-10-CM | POA: Diagnosis not present

## 2021-01-18 DIAGNOSIS — M1712 Unilateral primary osteoarthritis, left knee: Secondary | ICD-10-CM

## 2021-01-18 NOTE — Patient Instructions (Addendum)
Thank you for coming in today.   Please get an Xray today before you leave   I've referred you to Physical Therapy.  Let us know if you don't hear from them in one week.   [facility] [place], [place] [address] Phone: [phone]   Please use Voltaren gel (Generic Diclofenac Gel) up to 4x daily for pain as needed.  This is available over-the-counter as both the name brand Voltaren gel and the generic diclofenac gel.   You received a steroid injection in your left knee today. Seek immediate medical attention if the joint becomes red, extremely painful, or is oozing fluid.   Recheck back in 6 weeks

## 2021-01-19 ENCOUNTER — Other Ambulatory Visit: Payer: 59

## 2021-01-19 MED ORDER — ACETAMINOPHEN 500 MG PO TABS: 500.0000 mg | ORAL_TABLET | Freq: Four times a day (QID) | ORAL | 2 refills | Status: DC | PRN

## 2021-01-22 ENCOUNTER — Other Ambulatory Visit: Payer: 59

## 2021-01-22 NOTE — Progress Notes (Signed)
Knee x-ray shows a little bit of arthritis and some swelling in the knee joint. Additionally the radiologist can see where I did an injection.

## 2021-01-29 ENCOUNTER — Ambulatory Visit: Payer: 59 | Admitting: Physical Therapy

## 2021-02-06 ENCOUNTER — Encounter: Payer: Self-pay | Admitting: Family Medicine

## 2021-02-06 DIAGNOSIS — M1712 Unilateral primary osteoarthritis, left knee: Secondary | ICD-10-CM

## 2021-02-06 MED ORDER — DICLOFENAC SODIUM 1 % EX GEL: 2.0000 g | Freq: Four times a day (QID) | CUTANEOUS | 3 refills | Status: DC

## 2021-02-13 ENCOUNTER — Ambulatory Visit (HOSPITAL_COMMUNITY)
Admission: EM | Admit: 2021-02-13 | Discharge: 2021-02-13 | Disposition: A | Discharge status: Home / Self Care | Payer: 59 | Attending: Student | Admitting: Student

## 2021-02-13 ENCOUNTER — Other Ambulatory Visit: Payer: Self-pay

## 2021-02-13 ENCOUNTER — Encounter (HOSPITAL_COMMUNITY): Payer: Self-pay | Admitting: Emergency Medicine

## 2021-02-13 DIAGNOSIS — R059 Cough, unspecified: Secondary | ICD-10-CM | POA: Insufficient documentation

## 2021-02-13 DIAGNOSIS — Z8616 Personal history of COVID-19: Secondary | ICD-10-CM | POA: Diagnosis not present

## 2021-02-13 DIAGNOSIS — U071 COVID-19: Secondary | ICD-10-CM | POA: Diagnosis not present

## 2021-02-13 DIAGNOSIS — Z87891 Personal history of nicotine dependence: Secondary | ICD-10-CM | POA: Diagnosis not present

## 2021-02-13 DIAGNOSIS — J069 Acute upper respiratory infection, unspecified: Secondary | ICD-10-CM | POA: Insufficient documentation

## 2021-02-13 LAB — POC INFLUENZA A AND B ANTIGEN (URGENT CARE ONLY)
INFLUENZA A ANTIGEN, POC: NEGATIVE
INFLUENZA B ANTIGEN, POC: NEGATIVE

## 2021-02-13 MED ORDER — PROMETHAZINE-DM 6.25-15 MG/5ML PO SYRP: 2.5000 mL | ORAL_SOLUTION | Freq: Four times a day (QID) | ORAL | 0 refills | Status: DC | PRN

## 2021-02-13 MED ORDER — ALBUTEROL SULFATE HFA 108 (90 BASE) MCG/ACT IN AERS: 1.0000 | INHALATION_SPRAY | Freq: Four times a day (QID) | RESPIRATORY_TRACT | 0 refills | Status: DC | PRN

## 2021-02-13 NOTE — ED Provider Notes (Signed)
[place]    CSN: [id number] Arrival date & time: 02/13/21  5053      History   Chief Complaint Chief Complaint  Patient presents with   Cough   Headache   Generalized Body Aches    HPI [name] is a 50 y.o. female presenting with viral syndrome x4 days. Medical history respiratory failure following covid 09/2019 but no issues since then, no regular inhalers. Describes cough, headaches, myalgias, fevers as high as 100.4 cough is nonproductive. Has attempted few medications at home including tylenol. Denies SOB,  CP, dizziness. Denies known sick contacts.    HPI  Past Medical History:  Diagnosis Date   Abnormal Pap smear 2000   [facility] Biopsy   Acute respiratory failure with hypoxia ([place]) 10/19/2019   Allergy    Arthritis    hands   Back pain    Blood transfusion without reported diagnosis 2005   Breast fibroadenoma    Diverticulitis of colon with perforation 05/20/2014   GERD (gastroesophageal reflux disease)    H/O metrorrhagia 12/2006   H/O myomectomy 07-2006   robotic   H/O sinusitis    Headache(784.0)    Heart murmur    History of conization of cervix 2000   History of ectopic pregnancy 2005   r salpyngectomy   History of hysterectomy, supracervical 08-2008   robotic   History of syphilis    History of syphilis    Hx of adenomatous polyp of colon 12/31/2017   Hx of herpes simplex type 2 infection    Hyperlipidemia    Hypertension    Ileostomy present ([place]) 10/26/2014   Intra-abdominal abscess ([place]) 07/21/2014   PMS (premenstrual syndrome)     Patient Active Problem List   Diagnosis Date Noted   Centrilobular emphysema ([place]) 06/29/2020   Aortic atherosclerosis ([place]) 06/29/2020   COVID-19 virus infection 10/19/2019   Mixed hyperlipidemia 10/19/2019   Prediabetes 10/08/2018   Body mass index (BMI) 38.0-38.9, adult 03/29/2018   Nonspecific abnormal electrocardiogram (ECG) (EKG) 03/27/2018   Atypical chest pain 03/27/2018   History of  hysterectomy, supracervical 02/14/2017   Trigger ring finger of right hand 08/07/2015   Primary osteoarthritis of first carpometacarpal joint of right hand 08/07/2015   Diverticulitis of sigmoid colon s/p colectomy 06/30/2014 07/26/2014   Hx of adenomatous polyp of colon 12/23/2013   Essential hypertension 11/02/2013   Former smoker 11/26/2008    Past Surgical History:  Procedure Laterality Date   ABDOMINAL HYSTERECTOMY     BREAST LUMPECTOMY  10/11   rt-neg   BREAST SURGERY  2004   fibroademoma   CERVICAL CONE BIOPSY  2000   COLON RESECTION N/A 07/06/2014   Procedure: LAPAROSCOPIC LOOP ILEOSTOMY WITH PLACEMENT OF PELVIC DRAIN;  Surgeon: Donnie [facility], MD;  Location: [facility];  Service: General;  Laterality: N/A;   COLON SURGERY     diverticulitis    COLONOSCOPY     DILATION AND CURETTAGE OF UTERUS  2009   DIVERTING ILEOSTOMY Right 06/2014   ECTOPIC PREGNANCY SURGERY  2005   R salpyngectomy   ILEOSTOMY N/A 10/26/2014   Procedure: LOOP ILEOSTOMY REVERSAL;  Surgeon: Donnie [facility], MD;  Location: [facility];  Service: General;  Laterality: N/A;   LAPAROSCOPIC SIGMOID COLECTOMY N/A 06/30/2014   Procedure: LAPAROSCOPIC ASSISTED SIGMOID COLECTOMY;  Surgeon: Donnie [facility]a, MD;  Location: [facility];  Service: General;  Laterality: N/A;   MYOMECTOMY  2008   robotic   POLYPECTOMY  2009   ROBOTIC ASSISTED LAP VAGINAL HYSTERECTOMY  2010   supracervical   SALPINGECTOMY  2005   TRIGGER FINGER RELEASE Right 09/28/2012   Procedure: RELEASE TRIGGER FINGER/A-1 PULLEY RIGHT THUMB;  Surgeon: Jolyn Nap, MD;  Location: [facility];  Service: Orthopedics;  Laterality: Right;   ULNAR NERVE TRANSPOSITION Right 12/01/2018   Procedure: ULNAR NERVE DECOMPRESSION;  Surgeon: Daryll Brod, MD;  Location: [facility];  Service: Orthopedics;  Laterality: Right;  AXILLARY    OB History     Gravida  4   Para  2   Term  2   Preterm      AB  2   Living  2      SAB  0   IAB  1    Ectopic  1   Multiple  0   Live Births               Home Medications    Prior to Admission medications   Medication Sig Start Date End Date Taking? Authorizing Provider  albuterol (VENTOLIN HFA) 108 (90 Base) MCG/ACT inhaler Inhale 1-2 puffs into the lungs every 6 (six) hours as needed for wheezing or shortness of breath. 02/13/21  Yes Hazel Sams, PA-C  promethazine-dextromethorphan (PROMETHAZINE-DM) 6.25-15 MG/5ML syrup Take 2.5 mLs by mouth 4 (four) times daily as needed for cough. 02/13/21  Yes Hazel Sams, PA-C  acetaminophen (TYLENOL) 500 MG tablet Take 1 tablet (500 mg total) by mouth every 6 (six) hours as needed for fever or moderate pain. 01/19/21   Haydee Salter, MD  amLODipine (NORVASC) 5 MG tablet Take 1 tablet (5 mg total) by mouth daily. 07/28/20   Haydee Salter, MD  atorvastatin (LIPITOR) 20 MG tablet Take 1 tablet (20 mg total) by mouth daily. 07/28/20   Haydee Salter, MD  Black Cohosh 20 MG TABS Take 40 mg by mouth daily.     [provider]  diclofenac Sodium (VOLTAREN) 1 % GEL Apply 2 g topically 4 (four) times daily. 02/06/21   Haydee Salter, MD  lisinopril (ZESTRIL) 10 MG tablet Take 1 tablet (10 mg total) by mouth daily. 07/28/20   Haydee Salter, MD  metFORMIN (GLUCOPHAGE) 500 MG tablet Take 1 tablet (500 mg total) by mouth 2 (two) times daily with a meal. 06/29/20   Haydee Salter, MD  Multiple Vitamins-Minerals (HAIR/SKIN/NAILS/BIOTIN PO) Take 1 tablet by mouth daily.    [provider]  nitroGLYCERIN (NITROSTAT) 0.4 MG SL tablet Place 1 tablet (0.4 mg total) under the tongue every 5 (five) minutes as needed for up to 25 days for chest pain. 01/17/20 01/15/21  Adrian Prows, MD    Family History Family History  Problem Relation Age of Onset   Hypertension Paternal Grandmother    Diabetes Paternal Grandmother    Hypertension Maternal Grandmother    Sarcoidosis Mother    Hypertension Mother    Hyperlipidemia Mother    Thyroid  disease Maternal Aunt    Mental illness Cousin    Fibroids Sister    Colon cancer Neg Hx    Colon polyps Neg Hx    Esophageal cancer Neg Hx    Rectal cancer Neg Hx    Stomach cancer Neg Hx     Social History Social History   Tobacco Use   Smoking status: Former    Packs/day: 1.00    Years: 20.00    Pack years: 20.00    Types: Cigarettes    Quit date: 06/13/2011  Years since quitting: 9.6   Smokeless tobacco: Never  Vaping Use   Vaping Use: Never used  Substance Use Topics   Alcohol use: Not Currently   Drug use: No     Allergies   Keflex [cephalexin] and Penicillins   Review of Systems Review of Systems  Constitutional:  Positive for fever. Negative for appetite change and chills.  HENT:  Positive for congestion. Negative for ear pain, rhinorrhea, sinus pressure, sinus pain and sore throat.   Eyes:  Negative for redness and visual disturbance.  Respiratory:  Positive for cough. Negative for chest tightness, shortness of breath and wheezing.   Cardiovascular:  Negative for chest pain and palpitations.  Gastrointestinal:  Negative for abdominal pain, constipation, diarrhea, nausea and vomiting.  Genitourinary:  Negative for dysuria, frequency and urgency.  Musculoskeletal:  Positive for myalgias.  Neurological:  Negative for dizziness, weakness and headaches.  Psychiatric/Behavioral:  Negative for confusion.   All other systems reviewed and are negative.   Physical Exam Triage Vital Signs ED Triage Vitals  Enc Vitals Group     BP 02/13/21 0951 131/86     Pulse Rate 02/13/21 0951 (!) 110     Resp 02/13/21 0951 18     Temp 02/13/21 0951 99.7 F (37.6 C)     Temp Source 02/13/21 0951 Oral     SpO2 02/13/21 0951 97 %     Weight --      Height --      Head Circumference --      Peak Flow --      Pain Score 02/13/21 0949 5     Pain Loc --      Pain Edu? --      Excl. in [place]? --    No data found.  Updated Vital Signs BP 131/86 (BP Location: Left Arm)     Pulse (!) 110    Temp 99.7 F (37.6 C) (Oral)    Resp 18    SpO2 97%   Visual Acuity Right Eye Distance:   Left Eye Distance:   Bilateral Distance:    Right Eye Near:   Left Eye Near:    Bilateral Near:     Physical Exam Vitals reviewed.  Constitutional:      General: She is not in acute distress.    Appearance: Normal appearance. She is not ill-appearing.  HENT:     Head: Normocephalic and atraumatic.     Right Ear: Tympanic membrane, ear canal and external ear normal. No tenderness. No middle ear effusion. There is no impacted cerumen. Tympanic membrane is not perforated, erythematous, retracted or bulging.     Left Ear: Tympanic membrane, ear canal and external ear normal. No tenderness.  No middle ear effusion. There is no impacted cerumen. Tympanic membrane is not perforated, erythematous, retracted or bulging.     Nose: Nose normal. No congestion.     Mouth/Throat:     Mouth: Mucous membranes are moist.     Pharynx: Uvula midline. No oropharyngeal exudate or posterior oropharyngeal erythema.  Eyes:     Extraocular Movements: Extraocular movements intact.     Pupils: Pupils are equal, round, and reactive to light.  Cardiovascular:     Rate and Rhythm: Regular rhythm. Tachycardia present.     Heart sounds: Normal heart sounds.  Pulmonary:     Effort: Pulmonary effort is normal.     Breath sounds: Normal breath sounds. No decreased breath sounds, wheezing, rhonchi or rales.  Abdominal:  Palpations: Abdomen is soft.     Tenderness: There is no abdominal tenderness. There is no guarding or rebound.  Lymphadenopathy:     Cervical: No cervical adenopathy.     Right cervical: No superficial cervical adenopathy.    Left cervical: No superficial cervical adenopathy.  Neurological:     General: No focal deficit present.     Mental Status: She is alert and oriented to person, place, and time.  Psychiatric:        Mood and Affect: Mood normal.        Behavior: Behavior  normal.        Thought Content: Thought content normal.        Judgment: Judgment normal.     UC Treatments / Results  Labs (all labs ordered are listed, but only abnormal results are displayed) Labs Reviewed  SARS CORONAVIRUS 2 (TAT 6-24 HRS)  POC INFLUENZA A AND B ANTIGEN (URGENT CARE ONLY)    EKG   Radiology No results found.  Procedures Procedures (including critical care time)  Medications Ordered in UC Medications - No data to display  Initial Impression / Assessment and Plan / UC Course  I have reviewed the triage vital signs and the nursing notes.  Pertinent labs & imaging results that were available during my care of the patient were reviewed by me and considered in my medical decision making (see chart for details).     This patient is a very pleasant 50 y.o. year old female presenting with viral syndrome x4 days. Today this pt is tachy but afebrile nontachypneic, oxygenating well on room air, no wheezes rhonchi or rales.   Rapid influenza negative Covid PCR sent.   Albuterol inhaler, low-dose promethazine sent as below.   ED return precautions discussed. Patient verbalizes understanding and agreement. She is a [employer].   Coding Level 4 for acute illness with systemic symptoms, and prescription drug management    Final Clinical Impressions(s) / UC Diagnoses   Final diagnoses:  Viral URI with cough     Discharge Instructions      -Promethazine DM cough syrup for congestion/cough. This could make you drowsy, so take at night before bed. -Albuterol inhaler as needed for cough, wheezing, shortness of breath, 1 to 2 puffs every 6 hours as needed. -Tylenol up to 3x daily 1000mg  -Drink plenty of fluids -With a virus, you're typically contagious for 5-7 days, or as long as you're having fevers.  -Follow-up if symptoms worsen - chest pain, dizziness, shortness of breath      ED Prescriptions     Medication Sig Dispense Auth. Provider   albuterol  (VENTOLIN HFA) 108 (90 Base) MCG/ACT inhaler Inhale 1-2 puffs into the lungs every 6 (six) hours as needed for wheezing or shortness of breath. 1 each Hazel Sams, PA-C   promethazine-dextromethorphan (PROMETHAZINE-DM) 6.25-15 MG/5ML syrup Take 2.5 mLs by mouth 4 (four) times daily as needed for cough. 50 mL Hazel Sams, PA-C      PDMP not reviewed this encounter.   Hazel Sams, PA-C 02/13/21 1040

## 2021-02-13 NOTE — ED Triage Notes (Signed)
Since Friday having cough, headache, body aches, fevers.

## 2021-02-13 NOTE — Discharge Instructions (Addendum)
-  Promethazine DM cough syrup for congestion/cough. This could make you drowsy, so take at night before bed. -Albuterol inhaler as needed for cough, wheezing, shortness of breath, 1 to 2 puffs every 6 hours as needed. -Tylenol up to 3x daily 1000mg  -Drink plenty of fluids -With a virus, you're typically contagious for 5-7 days, or as long as you're having fevers.  -Follow-up if symptoms worsen - chest pain, dizziness, shortness of breath

## 2021-02-14 ENCOUNTER — Other Ambulatory Visit: Payer: Self-pay | Admitting: Cardiology

## 2021-02-14 ENCOUNTER — Encounter: Payer: Self-pay | Admitting: Family Medicine

## 2021-02-14 LAB — SARS CORONAVIRUS 2 (TAT 6-24 HRS): SARS Coronavirus 2: POSITIVE — AB

## 2021-02-14 MED ORDER — NITROGLYCERIN 0.4 MG SL SUBL: 0.4000 mg | SUBLINGUAL_TABLET | SUBLINGUAL | 1 refills | Status: DC | PRN

## 2021-02-21 ENCOUNTER — Ambulatory Visit: Payer: 59 | Admitting: Rehabilitative and Restorative Service Providers"

## 2021-02-23 ENCOUNTER — Ambulatory Visit: Payer: 59 | Admitting: Family Medicine

## 2021-02-27 ENCOUNTER — Ambulatory Visit: Payer: 59 | Admitting: Registered"

## 2021-02-27 ENCOUNTER — Ambulatory Visit: Payer: Self-pay | Admitting: Rehabilitative and Restorative Service Providers"

## 2021-02-27 NOTE — Progress Notes (Signed)
I, Wendy Poet, LAT, ATC, am serving as scribe for Dr. Lynne Leader.  [name] is a 51 y.o. female who presents to [place] at [facility] today for f/u of L knee pain thought to be due to exacerbation of DJD as well as a degenerative medial meniscus tear.  She was last seen by Dr. Georgina Snell on 01/18/21 and had a L knee steroid injection.  She was also advised to use Voltaren gel and was referred to PT to [facility], completing no visits to date.  Today, pt reports L started hurting again over the past 2-3 weeks w/ mechanical symptoms. Pt notes some "warmth" on the anterior medial aspect of the L knee. Pt had to cancel her PT visits x 2 due to a family emergency and having COVID.  She has intentionally lost weight.  Diagnostic testing: L knee XR- 01/18/21  Pertinent review of systems: No fevers or chills  Relevant historical information: Prediabetes   Exam:  BP 118/80    Pulse (!) 160    Ht 5\' 4"  (1.626 m)    Wt 219 lb 12.8 oz (99.7 kg)    SpO2 96%    BMI 37.73 kg/m  General: Well Developed, well nourished, and in no acute distress.   MSK: Left knee normal-appearing normal motion with crepitation.  Tender palpation medial joint line.    Lab and Radiology Results EXAM: LEFT KNEE 3 VIEWS   COMPARISON:  None.   FINDINGS: No recent fracture or dislocation is seen. There is soft tissue fullness in the suprapatellar bursa suggesting moderate effusion. Small bony spurs seen in the patella. There is minimal cortical irregularity in the medial margin of medial condyle of the distal femur. In the lateral view, there is a 5 mm low-density anterior to the distal shaft of left femur. This may suggest small pocket of air or fat in the suprapatellar effusion.   IMPRESSION: No fracture or dislocation is seen. There is soft tissue fullness in the suprapatellar bursa suggesting moderate to large effusion.   There is 5 mm focus of low attenuation at the upper  aspect of suprapatellar bursa effusion. Significance of this finding is not clear. This may suggest an artifact or air or fat in the suprapatellar effusion. If there are continued symptoms, short-term follow-up radiographic examination along with CT or MRI may be considered.     Electronically Signed   By: Elmer Picker M.D.   On: 01/20/2021 15:22 I, Lynne Leader, personally (independently) visualized and performed the interpretation of the images attached in this note.     Assessment and Plan: 51 y.o. female with left knee pain thought to be due to medial compartment DJD.  Pain was temporarily improved with a steroid injection but not well enough.  She has lost weight which is helping.  Plan to start physical therapy which was delayed due to [place].  In the interim we will work on authorization for hyaluronic acid injections or Zilretta injection. Ultimately at some point in future she may need a knee replacement.  She does not have severe DJD on x-ray so an MRI may be indicated.  We discussed all of this.  Proceed to PT work on weight loss and return for either gel shots or Zilretta injections. Total encounter time 20 minutes including face-to-face time with the patient and, reviewing past medical record, and charting on the date of service.      Discussed warning signs or symptoms. Please see discharge instructions.  Patient expresses understanding. The above documentation has been reviewed and is accurate and complete Lynne Leader, M.D.

## 2021-03-01 ENCOUNTER — Other Ambulatory Visit: Payer: Self-pay

## 2021-03-01 ENCOUNTER — Ambulatory Visit (INDEPENDENT_AMBULATORY_CARE_PROVIDER_SITE_OTHER): Payer: 59 | Admitting: Family Medicine

## 2021-03-01 VITALS — BP 118/80 | HR 160 | Ht 64.0 in | Wt 219.8 lb

## 2021-03-01 DIAGNOSIS — Z6837 Body mass index (BMI) 37.0-37.9, adult: Secondary | ICD-10-CM | POA: Diagnosis not present

## 2021-03-01 DIAGNOSIS — M1712 Unilateral primary osteoarthritis, left knee: Secondary | ICD-10-CM | POA: Diagnosis not present

## 2021-03-01 DIAGNOSIS — M25562 Pain in left knee: Secondary | ICD-10-CM

## 2021-03-01 DIAGNOSIS — G8929 Other chronic pain: Secondary | ICD-10-CM | POA: Diagnosis not present

## 2021-03-01 NOTE — Patient Instructions (Addendum)
Thank you for coming in today.   We will work to get you approved for the gel shots  Call PT to get rescheduled.  You will hear from office about scheduling the gel shots once we get approval

## 2021-03-16 ENCOUNTER — Ambulatory Visit: Payer: 59 | Admitting: Family Medicine

## 2021-03-26 NOTE — Progress Notes (Deleted)
° °  I, [name], LAT, ATC, am serving as scribe for Dr. Lynne Leader.  [name] is a 51 y.o. female who presents to [place] at [facility] today for L hip and L leg pain.  She was last seen by Dr. Georgina Snell on 03/01/21 for L knee pain f/u and was advised to proceed w/ previously-ordered PT.  Today, pt reports L hip and L leg pain x .  She locates her pain to .  Low back pain: Radiating pain: L LE numbness/tingling: Aggravating factors: Treatments tried:    Pertinent review of systems: ***  Relevant historical information: ***   Exam:  There were no vitals taken for this visit. General: Well Developed, well nourished, and in no acute distress.   MSK: ***    Lab and Radiology Results No results found for this or any previous visit (from the past 72 hour(s)). No results found.     Assessment and Plan: 51 y.o. female with ***   PDMP not reviewed this encounter. No orders of the defined types were placed in this encounter.  No orders of the defined types were placed in this encounter.    Discussed warning signs or symptoms. Please see discharge instructions. Patient expresses understanding.   ***

## 2021-03-27 ENCOUNTER — Ambulatory Visit: Payer: 59 | Admitting: Family Medicine

## 2021-03-30 ENCOUNTER — Encounter: Payer: Self-pay | Admitting: Family Medicine

## 2021-03-30 ENCOUNTER — Other Ambulatory Visit: Payer: Self-pay

## 2021-03-30 ENCOUNTER — Ambulatory Visit (INDEPENDENT_AMBULATORY_CARE_PROVIDER_SITE_OTHER): Payer: 59

## 2021-03-30 ENCOUNTER — Ambulatory Visit: Payer: 59 | Admitting: Family Medicine

## 2021-03-30 VITALS — BP 110/70 | HR 90 | Ht 64.0 in | Wt 221.4 lb

## 2021-03-30 DIAGNOSIS — M25552 Pain in left hip: Secondary | ICD-10-CM

## 2021-03-30 DIAGNOSIS — G8929 Other chronic pain: Secondary | ICD-10-CM

## 2021-03-30 DIAGNOSIS — M5416 Radiculopathy, lumbar region: Secondary | ICD-10-CM | POA: Diagnosis not present

## 2021-03-30 DIAGNOSIS — M545 Low back pain, unspecified: Secondary | ICD-10-CM

## 2021-03-30 MED ORDER — PREDNISONE 50 MG PO TABS: ORAL_TABLET | ORAL | 0 refills | Status: DC

## 2021-03-30 MED ORDER — GABAPENTIN 300 MG PO CAPS: 300.0000 mg | ORAL_CAPSULE | Freq: Three times a day (TID) | ORAL | 2 refills | Status: DC

## 2021-03-30 NOTE — Progress Notes (Signed)
I, [name], LAT, ATC, am serving as scribe for Dr. Lynne [name].  [name] is a 51 y.o. female who presents to [place] at [facility] today for L hip pain. Pt was previously seen by Dr. Georgina [name] on 03/01/21 for L knee pain. Today, pt c/o L hip pain since Saturday, Mar 24, 2021 w/ no known MOI. Pt locates pain to her L lateral hip w/ radiating pain into her L leg.  Radiates: into her L leg to her L lower leg just above her ankle Low back pain: yes LE Numbness/tingling: no LE Weakness: Aggravates: laying on her L side; climbing stairs; prolonged standing Treatments tried: Tylenol 3 from her husband; IBU; Voltaren gel  Pertinent review of systems: No fevers or chills  Relevant historical information: Hypertension.   Exam:  BP 110/70 (BP Location: Right Arm, Patient Position: Sitting, Cuff Size: Large)    Pulse 90    Ht 5\' 4"  (1.626 m)    Wt 221 lb 6.4 oz (100.4 kg)    SpO2 95%    BMI 38.00 kg/m  General: Well Developed, well nourished, and in no acute distress.   MSK: L-spine: Nontender midline. Tender palpation lumbar paraspinal musculature. Decreased lumbar motion. Positive left-sided straight leg raise test. Lower extremity strength is intact except noted below. Reflexes are intact.   Left hip: Normal-appearing Tender palpation greater trochanter. Hip abduction and external rotation strength are diminished 4/5.     Lab and Radiology Results  X-ray images L-spine and left hip obtained today personally and independently interpreted  Left hip: Mild to moderate left hip DJD.  L-spine: Mild DDD L5-S1.  No acute fractures.  Await formal radiology review     Assessment and Plan: 51 y.o. female with multifactorial left lateral hip and left leg pain.  I think [name] has greater trochanteric bursitis and left lumbar radiculopathy.  Lumbar radiculopathy left leg at L5 dermatomal pattern.  Plan to treat with prednisone and gabapentin and  physical therapy referral.  Recheck back in about 6 weeks.  If needed MRI in the future if not improved.  Left greater trochanteric bursitis.  Prednisone for L-spine lumbar radiculopathy should be helpful.  Physical therapy should be main treatment.  If needed in the future injection.   PDMP not reviewed this encounter. Orders Placed This Encounter  Procedures   DG Lumbar Spine 2-3 Views    Standing Status:   Future    Number of Occurrences:   1    Standing Expiration Date:   04/27/2021    Order Specific Question:   Reason for Exam (SYMPTOM  OR DIAGNOSIS REQUIRED)    Answer:   low back pain    Order Specific Question:   Is patient pregnant?    Answer:   No    Order Specific Question:   Preferred imaging location?    Answer:   [name]   DG HIP UNILAT W OR W/O PELVIS 2-3 VIEWS LEFT    Standing Status:   Future    Number of Occurrences:   1    Standing Expiration Date:   04/27/2021    Order Specific Question:   Reason for Exam (SYMPTOM  OR DIAGNOSIS REQUIRED)    Answer:   L hip pain    Order Specific Question:   Is patient pregnant?    Answer:   No    Order Specific Question:   Preferred imaging location?    Answer:   [name]  Ambulatory referral to Physical Therapy    Referral Priority:   Routine    Referral Type:   Physical Medicine    Referral Reason:   Specialty Services Required    Requested Specialty:   Physical Therapy    Number of Visits Requested:   1   Meds ordered this encounter  Medications   gabapentin (NEURONTIN) 300 MG capsule    Sig: Take 1 capsule (300 mg total) by mouth 3 (three) times daily.    Dispense:  90 capsule    Refill:  2   predniSONE (DELTASONE) 50 MG tablet    Sig: Take 1 pill daily for 5 days    Dispense:  5 tablet    Refill:  0     Discussed warning signs or symptoms. Please see discharge instructions. Patient expresses understanding.   The above documentation has been reviewed and is accurate and complete [name], M.D.

## 2021-03-30 NOTE — Patient Instructions (Addendum)
Good to see you today.  Please get an Xray today before you leave.  I've sent the medications to your pharmacy  I've referred you to Physical Therapy.  Let us know if you don't hear from them in one week.   Follow-up: 6-8 weeks

## 2021-04-02 ENCOUNTER — Encounter: Payer: Self-pay | Admitting: Family Medicine

## 2021-04-02 NOTE — Progress Notes (Signed)
Left hip x-ray looks normal to radiology.

## 2021-04-02 NOTE — Progress Notes (Signed)
Lumbar spine x-ray shows multilevel arthritis changes present.  Worse at right L4-L5 facet joint.

## 2021-04-03 MED ORDER — TRAMADOL HCL 50 MG PO TABS: 50.0000 mg | ORAL_TABLET | Freq: Three times a day (TID) | ORAL | 0 refills | Status: DC | PRN

## 2021-04-11 ENCOUNTER — Ambulatory Visit: Payer: Self-pay | Admitting: Physical Therapy

## 2021-04-11 NOTE — Therapy (Incomplete)
OUTPATIENT PHYSICAL THERAPY THORACOLUMBAR EVALUATION   Patient Name: [name] MRN: [id number] DOB:1970-08-03, 51 y.o., female Today's Date: 04/11/2021    Past Medical History:  Diagnosis Date   Abnormal Pap smear 2000   [facility] Biopsy   Acute respiratory failure with hypoxia ([place]) 10/19/2019   Allergy    Arthritis    hands   Back pain    Blood transfusion without reported diagnosis 2005   Breast fibroadenoma    Diverticulitis of colon with perforation 05/20/2014   GERD (gastroesophageal reflux disease)    H/O metrorrhagia 12/2006   H/O myomectomy 07-2006   robotic   H/O sinusitis    Headache(784.0)    Heart murmur    History of conization of cervix 2000   History of ectopic pregnancy 2005   r salpyngectomy   History of hysterectomy, supracervical 08-2008   robotic   History of syphilis    History of syphilis    Hx of adenomatous polyp of colon 12/31/2017   Hx of herpes simplex type 2 infection    Hyperlipidemia    Hypertension    Ileostomy present ([place]) 10/26/2014   Intra-abdominal abscess ([place]) 07/21/2014   PMS (premenstrual syndrome)    Past Surgical History:  Procedure Laterality Date   ABDOMINAL HYSTERECTOMY     BREAST LUMPECTOMY  10/11   rt-neg   BREAST SURGERY  2004   fibroademoma   CERVICAL CONE BIOPSY  2000   COLON RESECTION N/A 07/06/2014   Procedure: LAPAROSCOPIC LOOP ILEOSTOMY WITH PLACEMENT OF PELVIC DRAIN;  Surgeon: Donnie Mesa, MD;  Location: [facility];  Service: General;  Laterality: N/A;   COLON SURGERY     diverticulitis    COLONOSCOPY     DILATION AND CURETTAGE OF UTERUS  2009   DIVERTING ILEOSTOMY Right 06/2014   ECTOPIC PREGNANCY SURGERY  2005   R salpyngectomy   ILEOSTOMY N/A 10/26/2014   Procedure: LOOP ILEOSTOMY REVERSAL;  Surgeon: Donnie Mesa, MD;  Location: [facility];  Service: General;  Laterality: N/A;   LAPAROSCOPIC SIGMOID COLECTOMY N/A 06/30/2014   Procedure: LAPAROSCOPIC ASSISTED SIGMOID COLECTOMY;  Surgeon: Donnie Mesa, MD;   Location: [facility];  Service: General;  Laterality: N/A;   MYOMECTOMY  2008   robotic   POLYPECTOMY  2009   ROBOTIC ASSISTED LAP VAGINAL HYSTERECTOMY  2010   supracervical   SALPINGECTOMY  2005   TRIGGER FINGER RELEASE Right 09/28/2012   Procedure: RELEASE TRIGGER FINGER/A-1 PULLEY RIGHT THUMB;  Surgeon: Jolyn Nap, MD;  Location: [facility];  Service: Orthopedics;  Laterality: Right;   ULNAR NERVE TRANSPOSITION Right 12/01/2018   Procedure: ULNAR NERVE DECOMPRESSION;  Surgeon: Daryll Brod, MD;  Location: [facility];  Service: Orthopedics;  Laterality: Right;  AXILLARY   Patient Active Problem List   Diagnosis Date Noted   Centrilobular emphysema ([place]) 06/29/2020   Aortic atherosclerosis ([place]) 06/29/2020   COVID-19 virus infection 10/19/2019   Mixed hyperlipidemia 10/19/2019   Prediabetes 10/08/2018   Body mass index (BMI) 38.0-38.9, adult 03/29/2018   Nonspecific abnormal electrocardiogram (ECG) (EKG) 03/27/2018   Atypical chest pain 03/27/2018   History of hysterectomy, supracervical 02/14/2017   Trigger ring finger of right hand 08/07/2015   Primary osteoarthritis of first carpometacarpal joint of right hand 08/07/2015   Diverticulitis of sigmoid colon s/p colectomy 06/30/2014 07/26/2014   Hx of adenomatous polyp of colon 12/23/2013   Essential hypertension 11/02/2013   Former smoker 11/26/2008    PCP: Haydee Salter, MD  REFERRING PROVIDER:  Gregor Hams, MD  REFERRING DIAG: [phone] (ICD-10-CM) - Chronic bilateral low back pain without sciatica M25.552 (ICD-10-CM) - Left hip pain   THERAPY DIAG:  No diagnosis found.  ONSET DATE: ***  SUBJECTIVE:                                                                                                                                                                                           SUBJECTIVE STATEMENT: *** PERTINENT HISTORY:  ***  PAIN:  Are you having pain? {yes/no:20286} NPRS  scale: ***/10 Pain location: *** Pain orientation: {Pain Orientation:25161}  PAIN TYPE: {type:313116} Pain description: {PAIN DESCRIPTION:21022940}  Aggravating factors: *** Relieving factors: ***  PRECAUTIONS: {Therapy precautions:24002}  WEIGHT BEARING RESTRICTIONS {Yes ***/No:24003}  FALLS:  Has patient fallen in last 6 months? {yes/no:20286}, Number of falls: ***  LIVING ENVIRONMENT: Lives with: {[facility] lives with:25569::"lives with their family"} Lives in: {Lives in:25570} Stairs: {yes/no:20286}; {Stairs:24000} Has following equipment at home: {Assistive devices:23999}  OCCUPATION: ***  PLOF: {PLOF:24004}  PATIENT GOALS ***   OBJECTIVE:   DIAGNOSTIC FINDINGS:  ***  PATIENT SURVEYS:  04/11/21: FOTO ***  SCREENING FOR RED FLAGS: Bowel or bladder incontinence: {Yes/No:304960894} Spinal tumors: {Yes/No:304960894} Cauda equina syndrome: {Yes/No:304960894} Compression fracture: {Yes/No:304960894} Abdominal aneurysm: {Yes/No:304960894}  COGNITION:  Overall cognitive status: {cognition:24006}     SENSATION:  Light touch: {intact/deficits:24005}  Stereognosis: {intact/deficits:24005}  Hot/Cold: {intact/deficits:24005}  Proprioception: {intact/deficits:24005}  MUSCLE LENGTH: Hamstrings: Right *** deg; Left *** deg Thomas test: Right *** deg; Left *** deg  POSTURE:  ***  PALPATION: ***  LUMBARAROM/PROM  A/PROM A/PROM  04/11/2021  Flexion   Extension   Right lateral flexion   Left lateral flexion   Right rotation   Left rotation    (Blank rows = not tested)  LE AROM/PROM:  A/PROM Right 04/11/2021 Left 04/11/2021  Hip flexion    Hip extension    Hip abduction    Hip adduction    Hip internal rotation    Hip external rotation    Knee flexion    Knee extension    Ankle dorsiflexion    Ankle plantarflexion    Ankle inversion    Ankle eversion     (Blank rows = not tested)  LE MMT:  MMT Right 04/11/2021 Left 04/11/2021  Hip flexion    Hip  extension    Hip abduction    Hip adduction    Hip internal rotation    Hip external rotation    Knee flexion    Knee extension    Ankle dorsiflexion    Ankle plantarflexion    Ankle inversion    Ankle eversion     (  Blank rows = not tested)  LUMBAR SPECIAL TESTS:  {lumbar special test:25242}  FUNCTIONAL TESTS:  {Functional tests:24029}  GAIT: Distance walked: *** Assistive device utilized: {Assistive devices:23999} Level of assistance: {Levels of assistance:24026} Comments: ***    TODAY'S TREATMENT  ***   PATIENT EDUCATION:  Education details: *** Person educated: {Person educated:25204} Education method: {Education Method:25205} Education comprehension: {Education Comprehension:25206}   HOME EXERCISE PROGRAM: ***  ASSESSMENT:  CLINICAL IMPRESSION: Patient is a *** y.o. *** who was seen today for physical therapy evaluation and treatment for ***.    OBJECTIVE IMPAIRMENTS {opptimpairments:25111}.   ACTIVITY LIMITATIONS {activity limitations:25113}.   PERSONAL FACTORS {Personal factors:25162} are also affecting patient's functional outcome.    REHAB POTENTIAL: {rehabpotential:25112}  CLINICAL DECISION MAKING: {clinical decision making:25114}  EVALUATION COMPLEXITY: {Evaluation complexity:25115}   GOALS: Goals reviewed with patient? {yes/no:20286}  SHORT TERM GOALS:  STG Name Target Date Goal status  1 *** Baseline:  {follow up:25551} {GOALSTATUS:25110}  2 *** Baseline:  {follow up:25551} {GOALSTATUS:25110}  3 *** Baseline: {follow up:25551} {GOALSTATUS:25110}  4 *** Baseline: {follow up:25551} {GOALSTATUS:25110}  5 *** Baseline: {follow up:25551} {GOALSTATUS:25110}  6 *** Baseline: {follow up:25551} {GOALSTATUS:25110}  7 *** Baseline: {follow up:25551} {GOALSTATUS:25110}   LONG TERM GOALS:   LTG Name Target Date Goal status  1 *** Baseline: {follow up:25551} {GOALSTATUS:25110}  2 *** Baseline: {follow up:25551} {GOALSTATUS:25110}   3 *** Baseline: {follow up:25551} {GOALSTATUS:25110}  4 *** Baseline: {follow up:25551} {GOALSTATUS:25110}  5 *** Baseline: {follow up:25551} {GOALSTATUS:25110}  6 *** Baseline: {follow up:25551} {GOALSTATUS:25110}  7 *** Baseline: {follow up:25551} {GOALSTATUS:25110}   PLAN: PT FREQUENCY: {rehab frequency:25116}  PT DURATION: {rehab duration:25117}  PLANNED INTERVENTIONS: {rehab planned interventions:25118::"Therapeutic exercises","Therapeutic activity","Neuro Muscular re-education","Balance training","Gait training","Patient/Family education","Joint mobilization"}  PLAN FOR NEXT SESSION: Faustino Congress, PT 04/11/2021, 7:42 AM

## 2021-04-17 ENCOUNTER — Ambulatory Visit (INDEPENDENT_AMBULATORY_CARE_PROVIDER_SITE_OTHER): Payer: 59 | Admitting: Family Medicine

## 2021-04-17 ENCOUNTER — Encounter: Payer: Self-pay | Admitting: Family Medicine

## 2021-04-17 ENCOUNTER — Other Ambulatory Visit: Payer: Self-pay

## 2021-04-17 VITALS — BP 130/78 | HR 94 | Temp 97.4°F | Ht 64.0 in | Wt 221.0 lb

## 2021-04-17 DIAGNOSIS — Z1231 Encounter for screening mammogram for malignant neoplasm of breast: Secondary | ICD-10-CM

## 2021-04-17 DIAGNOSIS — Z87891 Personal history of nicotine dependence: Secondary | ICD-10-CM

## 2021-04-17 DIAGNOSIS — E782 Mixed hyperlipidemia: Secondary | ICD-10-CM

## 2021-04-17 DIAGNOSIS — I10 Essential (primary) hypertension: Secondary | ICD-10-CM | POA: Diagnosis not present

## 2021-04-17 DIAGNOSIS — R0683 Snoring: Secondary | ICD-10-CM

## 2021-04-17 DIAGNOSIS — M1612 Unilateral primary osteoarthritis, left hip: Secondary | ICD-10-CM | POA: Insufficient documentation

## 2021-04-17 DIAGNOSIS — M47816 Spondylosis without myelopathy or radiculopathy, lumbar region: Secondary | ICD-10-CM | POA: Insufficient documentation

## 2021-04-17 DIAGNOSIS — M23307 Other meniscus derangements, unspecified meniscus, left knee: Secondary | ICD-10-CM | POA: Insufficient documentation

## 2021-04-17 LAB — LIPID PANEL
Cholesterol: 165 mg/dL (ref 0–200)
HDL: 66.2 mg/dL (ref >39.00)
LDL Cholesterol: 85 mg/dL (ref 0–99)
NonHDL: 98.91
Total CHOL/HDL Ratio: 2
Triglycerides: 71 mg/dL (ref 0.0–149.0)
VLDL: 14.2 mg/dL (ref 0.0–40.0)

## 2021-04-17 LAB — BASIC METABOLIC PANEL
BUN: 10 mg/dL (ref 6–23)
CO2: 26 mEq/L (ref 19–32)
Calcium: 10.4 mg/dL (ref 8.4–10.5)
Chloride: 104 mEq/L (ref 96–112)
Creatinine, Ser: 0.83 mg/dL (ref 0.40–1.20)
GFR: 81.25 mL/min (ref >60.00)
Glucose, Bld: 92 mg/dL (ref 70–99)
Potassium: 3.6 mEq/L (ref 3.5–5.1)
Sodium: 141 mEq/L (ref 135–145)

## 2021-04-17 NOTE — Progress Notes (Signed)
[facility] [address] [place] [address] Dept: [phone] Dept Fax: [phone]  Chronic Care Office Visit  Subjective:    Patient ID: [name], female    DOB: [birth year], 51 y.o..   MRN: [id number]  Chief Complaint  Patient presents with   Follow-up    3 month f/u. Fasting today.  C/o having snoring at night.     History of Present Illness:  Patient is in today for reassessment of chronic medical issues.  [name] has a history of hypertension, managed with amlodipine and lisinopril.    [name] has hyperlipidemia, managed with atorvastatin.    [name] has prediabetes. She was referred to see a nutritionist about this, but has had family issues arise that caused her to need to reschedule this.   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 She had an ESI for her back and an intraarticular steroid injection of her knee. Both have given some temporary relief. She notes Dr. Georgina Snell was seeking pre-approval for her to have an intraarticular gel injection. She notes she is also having some hip pain and will be seeing Dr. Georgina Snell to consider an injection for this.  [name] notes that she has snoring issues. She states her husband has noted her snoring is becoming more frequent. She admits to non-restorative sleep most nights and daytime hypersomnolence. As noted above, she has a history of hypertension. She also notes occasional episodes of tachycardia when getting up to urinate at night. This does not cause chest pains, resolves quickly, and is not occurring other times of day or with other exertions.  [name] has a past history of tobacco use. She asked about possible LDCT for lung cancer screening.  Past Medical History: Patient Active Problem List   Diagnosis Date Noted   Centrilobular emphysema ([place]) 06/29/2020   Aortic  atherosclerosis ([place]) 06/29/2020   COVID-19 virus infection 10/19/2019   Mixed hyperlipidemia 10/19/2019   Prediabetes 10/08/2018   Body mass index (BMI) 38.0-38.9, adult 03/29/2018   Nonspecific abnormal electrocardiogram (ECG) (EKG) 03/27/2018   Atypical chest pain 03/27/2018   History of hysterectomy, supracervical 02/14/2017   Trigger ring finger of right hand 08/07/2015   Primary osteoarthritis of first carpometacarpal joint of right hand 08/07/2015   Diverticulitis of sigmoid colon s/p colectomy 06/30/2014 07/26/2014   Hx of adenomatous polyp of colon 12/23/2013   Essential hypertension 11/02/2013   Former smoker 11/26/2008   Past Surgical History:  Procedure Laterality Date   ABDOMINAL HYSTERECTOMY     BREAST LUMPECTOMY  10/11   rt-neg   BREAST SURGERY  2004   fibroademoma   CERVICAL CONE BIOPSY  2000   COLON RESECTION N/A 07/06/2014   Procedure: LAPAROSCOPIC LOOP ILEOSTOMY WITH PLACEMENT OF PELVIC DRAIN;  Surgeon: Donnie Mesa, MD;  Location: [facility];  Service: General;  Laterality: N/A;   COLON SURGERY     diverticulitis    COLONOSCOPY     DILATION AND CURETTAGE OF UTERUS  2009   DIVERTING ILEOSTOMY Right 06/2014   ECTOPIC PREGNANCY SURGERY  2005   R salpyngectomy   ILEOSTOMY N/A 10/26/2014   Procedure: LOOP ILEOSTOMY REVERSAL;  Surgeon: Donnie Mesa, MD;  Location: [facility];  Service: General;  Laterality: N/A;   LAPAROSCOPIC SIGMOID COLECTOMY N/A 06/30/2014   Procedure: LAPAROSCOPIC ASSISTED SIGMOID COLECTOMY;  Surgeon: Donnie Mesa, MD;  Location: [facility];  Service:  General;  Laterality: N/A;   MYOMECTOMY  2008   robotic   POLYPECTOMY  2009   ROBOTIC ASSISTED LAP VAGINAL HYSTERECTOMY  2010   supracervical   SALPINGECTOMY  2005   TRIGGER FINGER RELEASE Right 09/28/2012   Procedure: RELEASE TRIGGER FINGER/A-1 PULLEY RIGHT THUMB;  Surgeon: Jolyn Nap, MD;  Location: [facility];  Service: Orthopedics;  Laterality: Right;   ULNAR NERVE TRANSPOSITION Right  12/01/2018   Procedure: ULNAR NERVE DECOMPRESSION;  Surgeon: Daryll Brod, MD;  Location: [facility];  Service: Orthopedics;  Laterality: Right;  AXILLARY   Family History  Problem Relation Age of Onset   Sarcoidosis Mother    Hypertension Mother    Hyperlipidemia Mother    Cancer Father        Prostate   Fibroids Sister    Thyroid disease Maternal Aunt    Heart disease Paternal Uncle    Hypertension Maternal Grandmother    Hypertension Paternal Grandmother    Diabetes Paternal Grandmother    Mental illness Cousin    Colon cancer Neg Hx    Colon polyps Neg Hx    Esophageal cancer Neg Hx    Rectal cancer Neg Hx    Stomach cancer Neg Hx    Outpatient Medications Prior to Visit  Medication Sig Dispense Refill   amLODipine (NORVASC) 5 MG tablet Take 1 tablet (5 mg total) by mouth daily. 90 tablet 3   atorvastatin (LIPITOR) 20 MG tablet Take 1 tablet (20 mg total) by mouth daily. 90 tablet 3   Black Cohosh 20 MG TABS Take 40 mg by mouth daily.      diclofenac Sodium (VOLTAREN) 1 % GEL Apply 2 g topically 4 (four) times daily. 100 g 3   lisinopril (ZESTRIL) 10 MG tablet Take 1 tablet (10 mg total) by mouth daily. 90 tablet 3   Multiple Vitamins-Minerals (HAIR/SKIN/NAILS/BIOTIN PO) Take 1 tablet by mouth daily.     nitroGLYCERIN (NITROSTAT) 0.4 MG SL tablet Place 1 tablet (0.4 mg total) under the tongue every 5 (five) minutes as needed for up to 25 days for chest pain. 25 tablet 1   traMADol (ULTRAM) 50 MG tablet Take 1 tablet (50 mg total) by mouth every 8 (eight) hours as needed for severe pain. 15 tablet 0   gabapentin (NEURONTIN) 300 MG capsule Take 1 capsule (300 mg total) by mouth 3 (three) times daily. (Patient not taking: Reported on 04/17/2021) 90 capsule 2   predniSONE (DELTASONE) 50 MG tablet Take 1 pill daily for 5 days 5 tablet 0   No facility-administered medications prior to visit.   Allergies  Allergen Reactions   Keflex [Cephalexin] Anaphylaxis    Penicillins Anaphylaxis    Has patient had a PCN reaction causing immediate rash, facial/tongue/throat swelling, SOB or lightheadedness with hypotension: Yes Has patient had a PCN reaction causing severe rash involving mucus membranes or skin necrosis: No Has patient had a PCN reaction that required hospitalization: No Has patient had a PCN reaction occurring within the last 10 years: No If all of the above answers are "NO", then may proceed with Cephalosporin use.      Objective:   Today's Vitals   04/17/21 1006  BP: 130/78  Pulse: 94  Temp: (!) 97.4 F (36.3 C)  TempSrc: Temporal  SpO2: 98%  Weight: 221 lb (100.2 kg)  Height: 5\' 4"  (1.626 m)   Body mass index is 37.93 kg/m.   General: Well developed, well nourished. No acute distress.  Psych: Alert and oriented. Normal mood and affect.  Health Maintenance Due  Topic Date Due   Hepatitis C Screening  Never done   MAMMOGRAM  02/01/2021     STOP-Bang Score for Sleep Apnea Screening  Patient Self-Reported Questions         Score Do you snore loudly? (louder than  1  talking or sufficiently loud to be  heard through doors)  Do you feel often feel tired, fatigued, or 1  sleepy during the daytime?  Has anyone observed you stop breathing 0  during sleep?  Do you have (or are you being treated for) 1  high blood pressure?  Clinical Information          Score BMI>35 kg/m2     1  Age > 50 years    1  Neck circumference > 40 cm   1  Gender (female)     0  Total      6  A score <3 indicates a low risk of sleep apnea.  Assessment & Plan:   1. Essential hypertension Blood pressure is adequately managed. Continue amlodipine and lisinopril.  - Basic metabolic panel  2. Mixed hyperlipidemia Last lipids at goal on atorvastatin. We will recheck lipids today.  - Lipid panel  3. Snoring [name] is at high risk for having sleep apnea. I will refer her for a sleep study.  - Ambulatory referral to Sleep  Studies  4. Former smoker [name] has ~ a 13 pack-year history of smoking and quit 10 years ago. She does not meet criteria for LDCT lung cancer screening.  5. Encounter for screening mammogram for malignant neoplasm of breast  - MM DIGITAL SCREENING BILATERAL; Future  Return in about 3 months (around 07/15/2021) for Reassessment.   Haydee Salter, MD

## 2021-04-26 ENCOUNTER — Encounter: Payer: Self-pay | Admitting: Neurology

## 2021-04-26 ENCOUNTER — Ambulatory Visit (INDEPENDENT_AMBULATORY_CARE_PROVIDER_SITE_OTHER): Payer: 59 | Admitting: Neurology

## 2021-04-26 VITALS — BP 128/75 | HR 98 | Ht 64.0 in | Wt 216.6 lb

## 2021-04-26 DIAGNOSIS — E669 Obesity, unspecified: Secondary | ICD-10-CM | POA: Diagnosis not present

## 2021-04-26 DIAGNOSIS — R351 Nocturia: Secondary | ICD-10-CM

## 2021-04-26 DIAGNOSIS — Z9189 Other specified personal risk factors, not elsewhere classified: Secondary | ICD-10-CM

## 2021-04-26 DIAGNOSIS — Z820 Family history of epilepsy and other diseases of the nervous system: Secondary | ICD-10-CM

## 2021-04-26 DIAGNOSIS — R0683 Snoring: Secondary | ICD-10-CM | POA: Diagnosis not present

## 2021-04-26 DIAGNOSIS — G4719 Other hypersomnia: Secondary | ICD-10-CM

## 2021-04-26 DIAGNOSIS — R519 Headache, unspecified: Secondary | ICD-10-CM

## 2021-04-26 NOTE — Patient Instructions (Signed)

## 2021-04-26 NOTE — Progress Notes (Signed)
Subjective:    Patient ID: [name] is a 51 y.o. female.  HPI   Star Age, MD, PhD [facility] [address], [address] P.O. [place], [place] [address]  Dear Dr. Gena Fray,   I saw your patient, [name], upon your kind request, in my Sleep clinic today for initial consultation of her sleep disorder, in particular, concern for underlying obstructive sleep apnea.  The patient is unaccompanied today.  As you know, [name] is a 51 year old right-handed woman with an underlying medical history of hypertension, hyperlipidemia, prediabetes, allergies, arthritis, low back pain, knee pain, neck pain and obesity, who reports snoring and excessive daytime somnolence.  I reviewed your office note from 04/17/2021.  Her Epworth sleepiness score is 12/24, fatigue severity score is 25 out of 63.  She lives at home with her husband and 37 year old son, she also has a 8 year old son.  She does not work.  She goes to bed around 830 and rise time is around 5 AM.  She has nocturia about 3 times per average night and has woken up at night with palpitations.  She has occasional morning headaches which is described as a pressure-like sensation, not really painful, in the bifrontal and bitemporal area, does not typically take anything for it.  Her mom has sleep apnea and has a CPAP machine and her older son had sleep apnea and had his adenoids out when he was in middle school.  She has a TV in the bedroom and does have a tendency to have it on at night, her husband likes to watch it.  She has been using decaf coffee, she drinks about 2 bottles of soda per day.  She quit smoking some 10 years ago and does not drink any alcohol.  Of note, she had COVID in August 2021 and had to be hospitalized with COVID pneumonia.  She went home with oxygen.  She since then has been able to discontinue her home oxygen.  Her Past Medical History Is Significant For: Past Medical History:  Diagnosis  Date   Abnormal Pap smear 2000   [facility] Biopsy   Acute respiratory failure with hypoxia ([place]) 10/19/2019   Allergy    Arthritis    hands   Back pain    Blood transfusion without reported diagnosis 2005   Breast fibroadenoma    Diverticulitis of colon with perforation 05/20/2014   GERD (gastroesophageal reflux disease)    H/O metrorrhagia 12/2006   H/O myomectomy 07-2006   robotic   H/O sinusitis    Headache(784.0)    Heart murmur    History of conization of cervix 2000   History of ectopic pregnancy 2005   r salpyngectomy   History of hysterectomy, supracervical 08-2008   robotic   History of syphilis    History of syphilis    Hx of adenomatous polyp of colon 12/31/2017   Hx of herpes simplex type 2 infection    Hyperlipidemia    Hypertension    Ileostomy present ([place]) 10/26/2014   Intra-abdominal abscess ([place]) 07/21/2014   PMS (premenstrual syndrome)     Her Past Surgical History Is Significant For: Past Surgical History:  Procedure Laterality Date   ABDOMINAL HYSTERECTOMY     BREAST LUMPECTOMY  10/11   rt-neg   BREAST SURGERY  2004   fibroademoma   CERVICAL CONE BIOPSY  2000   COLON RESECTION N/A 07/06/2014   Procedure: LAPAROSCOPIC LOOP ILEOSTOMY WITH PLACEMENT OF PELVIC DRAIN;  Surgeon: Rodman Key  Georgette Dover, MD;  Location: [place];  Service: General;  Laterality: N/A;   COLON SURGERY     diverticulitis    COLONOSCOPY     DILATION AND CURETTAGE OF UTERUS  2009   DIVERTING ILEOSTOMY Right 06/2014   ECTOPIC PREGNANCY SURGERY  2005   R salpyngectomy   ILEOSTOMY N/A 10/26/2014   Procedure: LOOP ILEOSTOMY REVERSAL;  Surgeon: Donnie Mesa, MD;  Location: [facility];  Service: General;  Laterality: N/A;   LAPAROSCOPIC SIGMOID COLECTOMY N/A 06/30/2014   Procedure: LAPAROSCOPIC ASSISTED SIGMOID COLECTOMY;  Surgeon: Donnie Mesa, MD;  Location: [place] [place];  Service: General;  Laterality: N/A;   MYOMECTOMY  2008   robotic   POLYPECTOMY  2009   ROBOTIC ASSISTED LAP VAGINAL HYSTERECTOMY  2010    supracervical   SALPINGECTOMY  2005   TRIGGER FINGER RELEASE Right 09/28/2012   Procedure: RELEASE TRIGGER FINGER/A-1 PULLEY RIGHT THUMB;  Surgeon: Jolyn Nap, MD;  Location: [place];  Service: Orthopedics;  Laterality: Right;   ULNAR NERVE TRANSPOSITION Right 12/01/2018   Procedure: ULNAR NERVE DECOMPRESSION;  Surgeon: Daryll Brod, MD;  Location: [place];  Service: Orthopedics;  Laterality: Right;  AXILLARY    Her Family History Is Significant For: Family History  Problem Relation Age of Onset   Sarcoidosis Mother    Hypertension Mother    Hyperlipidemia Mother    Cancer Father        Prostate   Fibroids Sister    Thyroid disease Maternal Aunt    Heart disease Paternal Uncle    Hypertension Maternal Grandmother    Hypertension Paternal Grandmother    Diabetes Paternal Grandmother    Mental illness Cousin    Colon cancer Neg Hx    Colon polyps Neg Hx    Esophageal cancer Neg Hx    Rectal cancer Neg Hx    Stomach cancer Neg Hx    Sleep apnea Neg Hx     Her Social History Is Significant For: Social History   Socioeconomic History   Marital status: Married    Spouse name: [name]   Number of children: 2   Years of education: Not on file   Highest education level: Associate degree: occupational, Hotel manager, or vocational program  Occupational History   Occupation: CMA    Comment: [employer] at [employer] Use   Smoking status: Former    Packs/day: 1.00    Years: 20.00    Pack years: 20.00    Types: Cigarettes    Quit date: 06/13/2011    Years since quitting: 9.8   Smokeless tobacco: Never  Vaping Use   Vaping Use: Never used  Substance and Sexual Activity   Alcohol use: Not Currently   Drug use: No   Sexual activity: Yes  Other Topics Concern   Not on file  Social History Narrative   Lives with her husband and their son.   Social Determinants of Health   Financial Resource Strain: Not on file  Food Insecurity:  Not on file  Transportation Needs: Not on file  Physical Activity: Not on file  Stress: Not on file  Social Connections: Not on file    Her Allergies Are:  Allergies  Allergen Reactions   Keflex [Cephalexin] Anaphylaxis   Penicillins Anaphylaxis    Has patient had a PCN reaction causing immediate rash, facial/tongue/throat swelling, SOB or lightheadedness with hypotension: Yes Has patient had a PCN reaction causing severe rash involving mucus membranes or skin necrosis: No  Has patient had a PCN reaction that required hospitalization: No Has patient had a PCN reaction occurring within the last 10 years: No If all of the above answers are "NO", then may proceed with Cephalosporin use.   :   Her Current Medications Are:  Outpatient Encounter Medications as of 04/26/2021  Medication Sig   amLODipine (NORVASC) 5 MG tablet Take 1 tablet (5 mg total) by mouth daily.   atorvastatin (LIPITOR) 20 MG tablet Take 1 tablet (20 mg total) by mouth daily.   Black Cohosh 20 MG TABS Take 40 mg by mouth daily.    diclofenac Sodium (VOLTAREN) 1 % GEL Apply 2 g topically 4 (four) times daily. (Patient taking differently: Apply 2 g topically as needed.)   lisinopril (ZESTRIL) 10 MG tablet Take 1 tablet (10 mg total) by mouth daily.   Multiple Vitamins-Minerals (HAIR/SKIN/NAILS/BIOTIN PO) Take 1 tablet by mouth daily.   traMADol (ULTRAM) 50 MG tablet Take 1 tablet (50 mg total) by mouth every 8 (eight) hours as needed for severe pain. (Patient taking differently: Take 50 mg by mouth as needed for severe pain.)   nitroGLYCERIN (NITROSTAT) 0.4 MG SL tablet Place 1 tablet (0.4 mg total) under the tongue every 5 (five) minutes as needed for up to 25 days for chest pain. (Patient taking differently: Place 0.4 mg under the tongue as needed for chest pain.)   No facility-administered encounter medications on file as of 04/26/2021.  :   Review of Systems:  Out of a complete 14 point review of systems, all are  reviewed and negative with the exception of these symptoms as listed below:  Review of Systems  Neurological:        Pt is for sleep consult . Pt states she snores, and has hypertension . Pt denies headaches ,fatigue , sleep study ,CPAP at home   ESS:12 FSS:25   Objective:  Neurological Exam  Physical Exam Physical Examination:   Vitals:   04/26/21 1102  BP: 128/75  Pulse: 98    General Examination: The patient is a very pleasant 51 y.o. female in no acute distress. She appears well-developed and well-nourished and well groomed.   HEENT: Normocephalic, atraumatic, pupils are equal, round and reactive to light, extraocular tracking is good without limitation to gaze excursion or nystagmus noted. Hearing is grossly intact. Face is symmetric with normal facial animation. Speech is clear with no dysarthria noted. There is no hypophonia. There is no lip, neck/head, jaw or voice tremor. Neck is supple with full range of passive and active motion. There are no carotid bruits on auscultation. Oropharynx exam reveals: Mild mouth dryness, adequate dental hygiene, smaller tonsils but small airway negative, slightly wider uvula.  Mallampati class III.  Neck circumference of 17 inches, minimal tongue protrudes centrally and palate elevates symmetrically.    Chest: Clear to auscultation without wheezing, rhonchi or crackles noted.  Heart: S1+S2+0, regular and normal without murmurs, rubs or gallops noted.   Abdomen: Soft, non-tender and non-distended.  Extremities: There is no pitting edema in the distal lower extremities bilaterally.   Skin: Warm and dry without trophic changes noted.   Musculoskeletal: exam reveals no obvious joint deformities.   Neurologically:  Mental status: The patient is awake, alert and oriented in all 4 spheres. Her immediate and remote memory, attention, language skills and fund of knowledge are appropriate. There is no evidence of aphasia, agnosia, apraxia or  anomia. Speech is clear with normal prosody and enunciation. Thought process is linear. Mood  is normal and affect is normal.  Cranial nerves II - XII are as described above under HEENT exam.  Motor exam: Normal bulk, strength and tone is noted. There is no tremor. Fine motor skills and coordination: grossly intact.  Cerebellar testing: No dysmetria or intention tremor. There is no truncal or gait ataxia.  Sensory exam: intact to light touch in the upper and lower extremities.  Gait, station and balance: She stands easily. No veering to one side is noted. No leaning to one side is noted. Posture is age-appropriate and stance is narrow based. Gait shows normal stride length and normal pace. No problems turning are noted.   Assessment and Plan:  In summary, [name] is a very pleasant 51 y.o.-year old female with an underlying medical history of hypertension, hyperlipidemia, prediabetes, allergies, arthritis, low back pain, knee pain, neck pain and obesity, whose history and physical exam are concerning for obstructive sleep apnea (OSA). I had a long chat with the patient about my findings and the diagnosis of OSA, its prognosis and treatment options. We talked about medical treatments, surgical interventions and non-pharmacological approaches. I explained in particular the risks and ramifications of untreated moderate to severe OSA, especially with respect to developing cardiovascular disease down the Road, including congestive heart failure, difficult to treat hypertension, cardiac arrhythmias, or stroke. Even type 2 diabetes has, in part, been linked to untreated OSA. Symptoms of untreated OSA include daytime sleepiness, memory problems, mood irritability and mood disorder such as depression and anxiety, lack of energy, as well as recurrent headaches, especially morning headaches. We talked about trying to maintain a healthy lifestyle in general, as well as the importance of weight control. We  also talked about the importance of good sleep hygiene. I recommended the following at this time: sleep study.  I outlined the differences between a laboratory attended sleep study versus home sleep test. I explained the sleep test procedure to the patient and also outlined possible surgical and non-surgical treatment options of OSA, including the use of a custom-made dental device (which would require a referral to a specialist dentist or oral surgeon), upper airway surgical options, such as traditional UPPP or a novel less invasive surgical option in the form of Inspire hypoglossal nerve stimulation (which would involve a referral to an ENT surgeon). I also explained the CPAP treatment option to the patient, who indicated that she would be willing to try CPAP if the need arises. I explained the importance of being compliant with PAP treatment, not only for insurance purposes but primarily to improve Her symptoms, and for the patient's long term health benefit, including to reduce Her cardiovascular risks. I answered all her questions today and the patient was in agreement. I plan to see her back after the sleep study is completed and encouraged her to call with any interim questions, concerns, problems or updates.   Thank you very much for allowing me to participate in the care of this nice patient. If I can be of any further assistance to you please do not hesitate to call me at [phone].  Sincerely,   Star Age, MD, PhD

## 2021-05-01 ENCOUNTER — Encounter: Payer: Self-pay | Admitting: Registered"

## 2021-05-01 ENCOUNTER — Encounter: Payer: 59 | Attending: Family Medicine | Admitting: Registered"

## 2021-05-01 ENCOUNTER — Other Ambulatory Visit: Payer: Self-pay

## 2021-05-01 DIAGNOSIS — Z713 Dietary counseling and surveillance: Secondary | ICD-10-CM | POA: Insufficient documentation

## 2021-05-01 NOTE — Patient Instructions (Signed)
-   Aim to balance meals with 1/2 plate of non-starch vegetables + 1/2 plate of starch/grain or 1/4 plate of lean protein.  ? ?- Aim to balance snacks with protein + carbohydrates such as: ?Trail mix ?Fruit and nuts ?Chobani greek yogurt ? ?- Include water with meals.  ?

## 2021-05-01 NOTE — Progress Notes (Signed)
Medical Nutrition Therapy  ?Appointment Start time:  10:03  Appointment End time:  11:05 ? ?Primary concerns today: tips for portion sizes, how to read labels, nutrition goals for macronutrients, knowledge of how to make better food choices  ?Referral diagnosis: prediabetes ?Preferred learning style: no preference indicated ?Learning readiness: ready, change in progress ? ? ?NUTRITION ASSESSMENT  ? ?Pt arrives stating she stopped smoking in 2013 and gained 20 lbs. States her tastes and smells returned which made her want to eat more. States last year A1c was elevated and she was diagnosed with prediabetes. States she tried metformin for weight loss and experienced a lot of gastrointestinal side effects. Reports she stopped taking metformin and wants to use lifestyle changes to manage prediabetes. States she wants to lose weight to reduce medications usage. Reports family history of diabetes with paternal grandmother and paternal great-grandmother who were both diagnosed later in life. No known history with maternal family.  ? ?States she recently participated in a 6 day [employer] fast consisting of no food and water only. States this fast happens once a year. States she lost 15 lbs, going from 226 lbs to 211.2 lbs. States she will participate in partial day or full day fasts sometimes and working her way up to 40 days of fasting.  ? ?States she has a tandem bicycle for outdoor riding with husband. States she lifts 5 lbs weights sometimes.  ?States she needs to commit to physical activity regimen and stick with it. States sleep is broken up; will usually sleep 11-5 am (6 hrs) and 1-2 hrs in the morning after waking up with husband; 7-8 hrs total.  ? ?States she is a Engineer, petroleum; will eat chips, cupcakes, trail mix, corn chips, cashews, potted meat, crackers, cheese frittatas, nabs throughout the day. States she uses greek yogurt when making chicken salad with fruit. States she needs to change her mindset with  food.  ? ?Reports she wants to be healthy to be around for grandchildren and to feel better.  ? ?Pt expectations: tips for portion sizes, how to read labels, nutrition goals for macronutrients, knowledge of how to make better food choices ? ? ?Clinical ?Medical Hx: prediabetes ?Medications: See list ?Labs: elevated A1c (5.9), lipid panel WNL ?Notable Signs/Symptoms: none reported ? ?Lifestyle & Dietary Hx ? ?Estimated daily fluid intake: 80 oz ?Supplements: See list ?Sleep: 7-8 hrs, broken up ?Stress / self-care: hair and nails done bi-weekly, baking/cooking new recipes ?Current average weekly physical activity: strength training sometimes at home ? ?24-Hr Dietary Recall ?First Meal: 2 c decaf coffee (with 3-4 tsp creamer, 2 tsp sugar) or bacon + eggs + grits + biscuits with apple jelly or 3-4 slices of bacon + oatmeal/grits ?Snack:  ?Second Meal (12-1 pm): bologna sandwich (with miracle whip + American cheese) + 2 bags of chips or chicken wings + coke  ?Snack: sometimes mixed nuts or trail mix ?Third Meal: salisbury steak + peas + potatoes + rolls or spaghetti + salad or fried pork chops + fried apples or sausage + baked beans ?Snack:  ?Beverages: juice (32 oz), soda (16 oz), coffee (2*8 oz; 16 oz), water (16 oz); 80 oz ? ? ?NUTRITION DIAGNOSIS  ?NB-1.1 Food and nutrition-related knowledge deficit As related to prediabetes.  As evidenced by lack of previous nutrition-related education. ? ? ?NUTRITION INTERVENTION  ?Nutrition education (E-1) on the following topics: Pt was educated and counseled on metabolism, importance of not skipping meals, effects of dieting/restriction, how carbohydrates work in the body, prediabetes/diabetes  risk factors, A1c ranges for prediabetes/diabetes, role of fiber in eating regimen, and importance of physical activity. Discussed meal/snack planning and how to balance meals/snacks using MyPlate for prediabetes. Pt agreed with goals listed. ? ?Handouts Provided Include  ?My Plate for  Prediabetes ? ?Learning Style & Readiness for Change ?Teaching method utilized: Visual & Auditory  ?Demonstrated degree of understanding via: Teach Back  ?Barriers to learning/adherence to lifestyle change: contemplative stage of change ? ?Goals Established by Pt ?Aim to balance meals with 1/2 plate of non-starch vegetables + 1/2 plate of starch/grain or 1/4 plate of lean protein.  ?Aim to balance snacks with protein + carbohydrates such as: ?Trail mix ?Fruit and nuts ?Chobani greek yogurt ?Include water with meals.  ? ? ?MONITORING & EVALUATION ?Dietary intake, weekly physical activity. ? ?Next Steps  ?Patient is to follow-up in 1 month. ?

## 2021-05-11 ENCOUNTER — Other Ambulatory Visit: Payer: Self-pay

## 2021-05-11 ENCOUNTER — Ambulatory Visit: Payer: 59 | Admitting: Family Medicine

## 2021-05-11 ENCOUNTER — Ambulatory Visit (INDEPENDENT_AMBULATORY_CARE_PROVIDER_SITE_OTHER): Payer: 59

## 2021-05-11 VITALS — BP 130/84 | HR 90 | Ht 64.0 in | Wt 218.0 lb

## 2021-05-11 DIAGNOSIS — M79642 Pain in left hand: Secondary | ICD-10-CM

## 2021-05-11 NOTE — Progress Notes (Signed)
? ?  I, Peterson Lombard, LAT, ATC acting as a scribe for Lynne Leader, MD. ? ?Kaci Dillie is a 51 y.o. female who presents to [facility] today for f/u L hip pain, L leg pain, and lumbar radiculopathy. Pt was previously seen by Dr. Georgina Snell on 03/30/21 and was prescribed prednisone and gabapentin and referred to PT, but did not complete any visits (no-show her initial visit on 04/11/21). Today, pt reports she hasn't called to r/s her PT visits. Pt reports her L hip pain is not currently bothering her.  ? ?Today, pt c/o pain in her L 4th finger ongoing for over a year. Pt locates pain to IP joint of the L 4th finger. Pt only experiences pain with finger flexion. No swelling. Pt notes a family hx of OA and rheumatoid arthritis. ? ?Dx imaging: 03/30/21 L hip & L-spine XR ? ?Pertinent review of systems: No fevers or chills ? ?Relevant historical information: Hypertension.  Positive family history for RA and mother and maternal aunt. ? ? ?Exam:  ?BP 130/84   Pulse 90   Ht '5\' 4"'$  (1.626 m)   Wt 218 lb (98.9 kg)   SpO2 97%   BMI 37.42 kg/m?  ?General: Well Developed, well nourished, and in no acute distress.  ? ?MSK: Left hand: Slight swelling at fourth PIP.  Minimally tender to palpation. ?Normal range of motion.  No triggering present.  No erosions are visible. ?No significant swelling across MCPs.  No ulnar deviation present. ? ? ? ?Lab and Radiology Results ? ?X-ray images left hand obtained today personally and independently interpreted ?Minimal DJD at left fourth PIP.  No acute fractures are visible. ?Await formal radiology review ? ? ? ? ?Assessment and Plan: ?51 y.o. female with left hand pain at fourth PIP.  I suspect she has synovitis likely from her prior trauma.  Rheumatoid disease is unlikely given the physical exam presentation today.  However she does have a strong family history for RA.  Plan for trial of home exercise program including heat Voltaren gel and home exercises.   If this is not sufficient would consider rheumatologic work-up and referral to formal hand therapy.  Check back in 2 months if needed. ? ? ?PDMP not reviewed this encounter. ?Orders Placed This Encounter  ?Procedures  ? DG Hand Complete Left  ?  Standing Status:   Future  ?  Number of Occurrences:   1  ?  Standing Expiration Date:   06/11/2021  ?  Order Specific Question:   Reason for Exam (SYMPTOM  OR DIAGNOSIS REQUIRED)  ?  Answer:   left hand pain  ?  Order Specific Question:   Preferred imaging location?  ?  Answer:   [facility]  ?  Order Specific Question:   Is patient pregnant?  ?  Answer:   No  ? ?No orders of the defined types were placed in this encounter. ? ? ? ?Discussed warning signs or symptoms. Please see discharge instructions. Patient expresses understanding. ? ? ?The above documentation has been reviewed and is accurate and complete Lynne Leader, M.D. ? ? ?

## 2021-05-11 NOTE — Patient Instructions (Addendum)
Thank you for coming in today.  ? ?Please get an Xray today before you leave  ? ?Please use Voltaren gel (Generic Diclofenac Gel) up to 4x daily for pain as needed.  This is available over-the-counter as both the name brand Voltaren gel and the generic diclofenac gel.  ? ?Hand exercises with modeling clay ? ?Recheck back in 2 months if not better. ?

## 2021-05-12 ENCOUNTER — Other Ambulatory Visit: Payer: Self-pay | Admitting: Family Medicine

## 2021-05-14 MED ORDER — TRAMADOL HCL 50 MG PO TABS: 50.0000 mg | ORAL_TABLET | Freq: Three times a day (TID) | ORAL | 0 refills | Status: DC | PRN

## 2021-05-14 NOTE — Progress Notes (Signed)
Left hand x-ray looks normal to radiology.

## 2021-05-19 ENCOUNTER — Encounter: Payer: Self-pay | Admitting: Family Medicine

## 2021-05-23 ENCOUNTER — Ambulatory Visit (INDEPENDENT_AMBULATORY_CARE_PROVIDER_SITE_OTHER): Payer: 59 | Admitting: Neurology

## 2021-05-23 DIAGNOSIS — Z820 Family history of epilepsy and other diseases of the nervous system: Secondary | ICD-10-CM

## 2021-05-23 DIAGNOSIS — G4733 Obstructive sleep apnea (adult) (pediatric): Secondary | ICD-10-CM | POA: Diagnosis not present

## 2021-05-23 DIAGNOSIS — Z9189 Other specified personal risk factors, not elsewhere classified: Secondary | ICD-10-CM

## 2021-05-23 DIAGNOSIS — R519 Headache, unspecified: Secondary | ICD-10-CM

## 2021-05-23 DIAGNOSIS — R351 Nocturia: Secondary | ICD-10-CM

## 2021-05-23 DIAGNOSIS — G4720 Circadian rhythm sleep disorder, unspecified type: Secondary | ICD-10-CM

## 2021-05-23 DIAGNOSIS — R0683 Snoring: Secondary | ICD-10-CM

## 2021-05-23 DIAGNOSIS — E669 Obesity, unspecified: Secondary | ICD-10-CM

## 2021-05-23 DIAGNOSIS — G4719 Other hypersomnia: Secondary | ICD-10-CM

## 2021-05-28 NOTE — Procedures (Signed)
PATIENT'S NAME:  Annette, [name] ?DOB:      [birth year]      ?MR#:    [id number]     ?DATE OF RECORDING: 05/23/2021 ?REFERRING M.D.:  Arlester Marker, MD ?Study Performed:   Baseline Polysomnogram ?HISTORY: 51 year old woman with a history of hypertension, hyperlipidemia, prediabetes, allergies, arthritis, low back pain, knee pain, neck pain and obesity, who reports snoring and excessive daytime somnolence. The patient endorsed the Epworth Sleepiness Scale at 12 points. The patient's weight 216 pounds with a height of 64 (inches), resulting in a BMI of 36.9 kg/m2. The patient's neck circumference measured 17 inches. ? ?CURRENT MEDICATIONS: Norvasc, Lipitor, Black Cohosh, Voltaren, Zestril, Multivitamins, Ultram, Nitrostat ?  ?PROCEDURE:  This is a multichannel digital polysomnogram utilizing the Somnostar 11.2 system.  Electrodes and sensors were applied and monitored per AASM Specifications.   EEG, EOG, Chin and Limb EMG, were sampled at 200 Hz.  ECG, Snore and Nasal Pressure, Thermal Airflow, Respiratory Effort, CPAP Flow and Pressure, Oximetry was sampled at 50 Hz. Digital video and audio were recorded.     ? ?BASELINE STUDY ? ?Lights Out was at 21:37 and Lights On at 05:00.  Total recording time (TRT) was 443.5 minutes, with a total sleep time (TST) of 388 minutes.   The patient's sleep latency was 9 minutes.  REM latency was 97.5 minutes.  The sleep efficiency was 87.5 %.  ?   ?SLEEP ARCHITECTURE: WASO (Wake after sleep onset) was 53 minutes with mild sleep fragmentation noted. There were 29 minutes in Stage N1, 267 minutes Stage N2, 7 minutes Stage N3 and 85 minutes in Stage REM.  The percentage of Stage N1 was 7.5%, Stage N2 was 68.8%, which is increased, Stage N3 was 1.8% and Stage R (REM sleep) was 21.9%, which is normal. The arousals were noted as: 21 were spontaneous, 1 were associated with PLMs, 24 were associated with respiratory events. ? ?RESPIRATORY ANALYSIS:  There were a total of 207 respiratory events:   167 obstructive apneas, 2 central apneas and 7 mixed apneas with a total of 176 apneas and an apnea index (AI) of 27.2 /hour. There were 31 hypopneas with a hypopnea index of 4.8 /hour. The patient also had 0 respiratory event related arousals (RERAs).  ?    ?The total APNEA/HYPOPNEA INDEX (AHI) was 32./hour and the total RESPIRATORY DISTURBANCE INDEX was  32. /hour.  122 events occurred in REM sleep and 70 events in NREM. The REM AHI was  86.1 /hour, versus a non-REM AHI of 16.8. The patient spent 64 minutes of total sleep time in the supine position and 324 minutes in non-supine.. The supine AHI was 15.0 versus a non-supine AHI of 35.3. ? ?OXYGEN SATURATION & C02:  The Wake baseline 02 saturation was 94%, with the lowest being 69%. Time spent below 89% saturation equaled 92 minutes. ? ?PERIODIC LIMB MOVEMENTS: The patient had a total of 38 Periodic Limb Movements.  The Periodic Limb Movement (PLM) index was 5.9 and the PLM Arousal index was .2/hour. ?Audio and video analysis did not show any abnormal or unusual movements, behaviors, phonations or vocalizations. The patient took 2 bathroom breaks. Snoring ranged from mild to loud. The EKG was in keeping with normal sinus rhythm (NSR). ? ?Post-study, the patient indicated that sleep was worse than usual.  ? ?IMPRESSION: ? ?Obstructive Sleep Apnea (OSA) ?Dysfunctions associated with sleep stages or arousal from sleep ? ?RECOMMENDATIONS: ? ?This study demonstrates severe obstructive sleep apnea, with a total AHI of 32/hour,  REM AHI of 86.1/hour, supine AHI of 15/hour and O2 nadir of 69% (during REM sleep). Treatment with positive airway pressure in the form of CPAP is recommended. This will require a full night titration study to optimize therapy. Other treatment options may be limited due to the severity of her sleep apnea; options may include surgical treatment with a hypoglossal nerve stimulator in selected patients. Concomitant weight loss is recommended. Please  note that untreated obstructive sleep apnea may additional perioperative morbidity. Patients with significant obstructive sleep apnea should receive perioperative PAP therapy and the surgeons and particularly the anesthesiologist should be informed of the diagnosis and the severity of the sleep disordered breathing. ?This study shows sleep fragmentation and abnormal sleep stage percentages; these are nonspecific findings and per se do not signify an intrinsic sleep disorder or a cause for the patient's sleep-related symptoms. Causes include (but are not limited to) the first night effect of the sleep study, circadian rhythm disturbances, medication effect or an underlying mood disorder or medical problem.  ?The patient should be cautioned not to drive, work at heights, or operate dangerous or heavy equipment when tired or sleepy. Review and reiteration of good sleep hygiene measures should be pursued with any patient. ?The patient will be seen in follow-up in the sleep clinic at [facility] for discussion of the test results, symptom and treatment compliance review, further management strategies, etc. The referring provider will be notified of the test results. ? ?I certify that I have reviewed the entire raw data recording prior to the issuance of this report in accordance with the Standards of Accreditation of the [place] [employer] (AASM) ? ?Star Age, MD, PhD ?Diplomat, American Board of Neurology and Sleep Medicine (Neurology and Sleep Medicine) ? ? ? ?

## 2021-05-30 ENCOUNTER — Encounter: Payer: 59 | Attending: Family Medicine | Admitting: Registered"

## 2021-05-30 DIAGNOSIS — Z713 Dietary counseling and surveillance: Secondary | ICD-10-CM | POA: Insufficient documentation

## 2021-06-04 ENCOUNTER — Telehealth: Payer: Self-pay | Admitting: Neurology

## 2021-06-04 NOTE — Addendum Note (Signed)
Addended by: Star Age on: 06/04/2021 05:34 PM ? ? Modules accepted: Orders ? ?

## 2021-06-04 NOTE — Telephone Encounter (Signed)
Pt states she had her sleep study on 04-05, she would like a call with results just as soon as they are available. ?

## 2021-06-05 ENCOUNTER — Telehealth: Payer: Self-pay | Admitting: *Deleted

## 2021-06-05 NOTE — Telephone Encounter (Signed)
-----   Message from Star Age, MD sent at 06/04/2021  5:34 PM EDT ----- ?Patient referred by Dr. Gena Fray, seen by me on 04/26/21, diagnostic PSG on 05/23/21.   ?Please call and notify the patient that the recent sleep study showed severe obstructive sleep apnea. I recommend treatment for this in the form of CPAP. This will require a repeat sleep study for proper titration and mask fitting and correct monitoring of the oxygen saturations. Please explain to patient. I have placed an order in the chart. Thanks. ? ?Star Age, MD, PhD ?[facility]) ? ? ? ?

## 2021-06-05 NOTE — Telephone Encounter (Signed)
Called pt and discussed sleep study results as noted below. Pt is amenable to coming back to office for a repeat sleep study with CPAP for proper titration, mask fitting, and correct monitoring of oxygen saturations. Pt aware office will obtain insurance approval and then call her to schedule. She verbalized appreciation for the call and was advised to f/u in 2 weeks if she hasn't been called.  ?

## 2021-06-10 ENCOUNTER — Encounter: Payer: Self-pay | Admitting: Family Medicine

## 2021-06-10 DIAGNOSIS — G4733 Obstructive sleep apnea (adult) (pediatric): Secondary | ICD-10-CM | POA: Insufficient documentation

## 2021-06-18 ENCOUNTER — Ambulatory Visit (INDEPENDENT_AMBULATORY_CARE_PROVIDER_SITE_OTHER): Payer: 59 | Admitting: Neurology

## 2021-06-18 DIAGNOSIS — R519 Headache, unspecified: Secondary | ICD-10-CM

## 2021-06-18 DIAGNOSIS — G4733 Obstructive sleep apnea (adult) (pediatric): Secondary | ICD-10-CM | POA: Diagnosis not present

## 2021-06-18 DIAGNOSIS — E669 Obesity, unspecified: Secondary | ICD-10-CM

## 2021-06-18 DIAGNOSIS — G4720 Circadian rhythm sleep disorder, unspecified type: Secondary | ICD-10-CM

## 2021-06-18 DIAGNOSIS — G4719 Other hypersomnia: Secondary | ICD-10-CM

## 2021-06-18 DIAGNOSIS — G4761 Periodic limb movement disorder: Secondary | ICD-10-CM

## 2021-06-18 DIAGNOSIS — R351 Nocturia: Secondary | ICD-10-CM

## 2021-06-18 DIAGNOSIS — Z820 Family history of epilepsy and other diseases of the nervous system: Secondary | ICD-10-CM

## 2021-06-29 ENCOUNTER — Encounter: Payer: Self-pay | Admitting: Neurology

## 2021-06-29 NOTE — Procedures (Signed)
S PATIENT'S NAME:  [name], Auer ?DOB:      [birth year]      ?MR#:    [id number]     ?DATE OF RECORDING: 06/18/2021 ?REFERRING M.D.:  Arlester Marker, MD ?Study Performed:   CPAP  Titration ?HISTORY: 51 year old woman with a history of hypertension, hyperlipidemia, prediabetes, allergies, arthritis, low back pain, knee pain, neck pain and obesity, who presents for a full night titration study. Her baseline sleep study from 05/23/21 showed severe obstructive sleep apnea, with a total AHI of 32/hour, REM AHI of 86.1/hour, supine AHI of 15/hour and O2 nadir of 69% (during REM sleep). The patient endorsed the Epworth Sleepiness Scale at 12 points and the Fatigue Score at  points. The patient's weight 216 pounds with a height of 64 (inches), resulting in a BMI of 36.9 kg/m2. The patient's neck circumference measured 17 inches. ? ?CURRENT MEDICATIONS: Norvasc, Lipitor, Black Cohosh, Voltaren, Zestril, Multivitamins, Ultram, Nitrostat ? ?PROCEDURE:  This is a multichannel digital polysomnogram utilizing the SomnoStar 11.2 system.  Electrodes and sensors were applied and monitored per AASM Specifications.   EEG, EOG, Chin and Limb EMG, were sampled at 200 Hz.  ECG, Snore and Nasal Pressure, Thermal Airflow, Respiratory Effort, CPAP Flow and Pressure, Oximetry was sampled at 50 Hz. Digital video and audio were recorded.     ? ?The patient was fitted with a small Simplus FFM from [employer]. CPAP was initiated at 5 cmH20 with heated humidity per AASM standards and pressure was advanced to 14 cmH20 because of hypopneas, apneas and desaturations.  At a PAP pressure of 14 cmH20, there was a reduction of the AHI to 2.8/hour with brief supine REM sleep achieved and O2 nadir of 92%.  ? ?Lights Out was at 21:05 and Lights On at 05:00. Total recording time (TRT) was 476 minutes, with a total sleep time (TST) of 416.5 minutes. The patient's sleep latency was 24.5 minutes. REM latency was 25 minutes, which is reduced. The sleep efficiency was 87.5 %.    ? ?SLEEP ARCHITECTURE: WASO (Wake after sleep onset)  was 34 minutes.  There were 19 minutes in Stage N1, 242 minutes Stage N2, 23 minutes Stage N3 and 132.5 minutes in Stage REM.  The percentage of Stage N1 was 4.6%, Stage N2 was 58.1%, which is mildly increased, Stage N3 was 5.5%, which is reduced, and Stage R (REM sleep) was 31.8%, which is increased, and in keeping with rebound. The arousals were noted as: 27 were spontaneous, 0 were associated with PLMs, 3 were associated with respiratory events. ? ?RESPIRATORY ANALYSIS:  There was a total of 34 respiratory events: 0 obstructive apneas, 0 central apneas and 0 mixed apneas with a total of 0 apneas and an apnea index (AI) of 0 /hour. There were 34 hypopneas with a hypopnea index of 4.9/hour. The patient also had 0 respiratory event related arousals (RERAs).     ? ?The total APNEA/HYPOPNEA INDEX  (AHI) was 4.9 /hour and the total RESPIRATORY DISTURBANCE INDEX was 4.9 /hour  14 events occurred in REM sleep and 20 events in NREM. The REM AHI was 6.3 /hour versus a non-REM AHI of 4.2 /hour.  The patient spent 138 minutes of total sleep time in the supine position and 279 minutes in non-supine. The supine AHI was 6.5, versus a non-supine AHI of 4.1. ? ?OXYGEN SATURATION & C02:  The baseline 02 saturation was 96%, with the lowest being 83%. Time spent below 89% saturation equaled 8 minutes. ? ?PERIODIC LIMB MOVEMENTS:  The patient had a total of 218 Periodic Limb Movements. The Periodic Limb Movement (PLM) index was 31.4 and the PLM Arousal index was 0 /hour. ? ?Audio and video analysis did not show any abnormal or unusual movements, behaviors, phonations or vocalizations. The patient took 1 bathroom break. Snoring was reduced. The EKG was in keeping with normal sinus rhythm (NSR). ? ?Post-study, the patient indicated that sleep was better than usual.  ? ?IMPRESSION: ?  ?Obstructive Sleep Apnea (OSA) ?Periodic Limb Movement Disorder ?Dysfunctions associated with sleep  stages or arousal from sleep ?  ?RECOMMENDATIONS: ?  ?This study demonstrates significant reduction of the patient's sleep apnea with CPAP therapy.  I recommend home CPAP therapy with a set pressure of 14 cm with EPR of 3 (which was utilized during the study as well), via small fullface mask from [employer] or mask of choice, with heated humidity. The patient will be advised to be fully compliant with PAP therapy to improve sleep related symptoms and decrease long term cardiovascular risks. The patient should be reminded, that it may take up to 3 months to get fully used to using PAP with all planned sleep. The earlier full compliance is achieved, the better long term compliance tends to be. Please note that untreated obstructive sleep apnea may carry additional perioperative morbidity. Patients with significant obstructive sleep apnea should receive perioperative PAP therapy and the surgeons and particularly the anesthesiologist should be informed of the diagnosis and the severity of the sleep disordered breathing. ?Moderate PLMs (periodic limb movements of sleep) were noted during this study without any significant arousals; clinical correlation is recommended.  PLM's may improve with sleep apnea treatment with CPAP therapy.   ?3.This study shows sleep fragmentation and abnormal sleep stage percentages; these are nonspecific findings and per se do not signify an intrinsic sleep disorder or a cause for the patient's sleep-related symptoms. Causes include (but are not limited to) the first night effect of the sleep study, circadian rhythm disturbances, medication effect or an underlying mood disorder or medical problem.  ?The patient should be cautioned not to drive, work at heights, or operate dangerous or heavy equipment when tired or sleepy. Review and reiteration of good sleep hygiene measures should be pursued with any patient. ?The patient will be seen in follow-up in the sleep clinic at [facility] for discussion  of the test results, symptom and treatment compliance review, further management strategies, etc. The referring provider will be notified of the test results. ?  ?I certify that I have reviewed the entire raw data recording prior to the issuance of this report in accordance with the Standards of Accreditation of the [place] [employer] (AASM) ?  ?Star Age, MD, PhD ?Diplomat, American Board of Neurology and Sleep Medicine (Neurology and Sleep Medicine) ?

## 2021-06-29 NOTE — Addendum Note (Signed)
Addended by: Star Age on: 06/29/2021 11:01 AM ? ? Modules accepted: Orders ? ?

## 2021-07-02 ENCOUNTER — Telehealth: Payer: Self-pay | Admitting: *Deleted

## 2021-07-02 NOTE — Telephone Encounter (Signed)
Spoke with patient and discussed her sleep study results and next steps. Pt is amenable to using a CPAP machine at home. Discussed insurance compliance requirements including using the machine at least 4 hours at night and also being seen in the office for a follow-up between 30 and 90 days after setup. Pt does not have a preference for DME company. Will use [employer]. Follow-up letter sent to pt's mychart. Pt appreciative for the call.  ? ?Result forwarded to referring MD, Dr Gena Fray. Order, sleep study results, office note, insurance info all faxed to [place]. Received a receipt of confirmation. ?Letter sent to pt.  ? ?Star Age, MD  ?06/29/2021 11:01 AM EDT   ?  ?Patient referred by Dr. Gena Fray, seen by me on 04/26/21, diagnostic PSG on 05/23/21.  Patient had a CPAP titration study on 06/18/21.  ?Please call and inform patient that I have entered an order for treatment with positive airway pressure (PAP) treatment for obstructive sleep apnea (OSA). She did well during the latest sleep study with CPAP. We will, therefore, arrange for a machine for home use through a DME (durable medical equipment) company of Her choice; and I will see the patient back in follow-up in about 10 weeks. Please also explain to the patient that I will be looking out for compliance data, which can be downloaded from the machine (stored on an SD card, that is inserted in the machine) or via remote access through a modem, that is built into the machine. At the time of the followup appointment we will discuss sleep study results and how it is going with PAP treatment at home. Please advise patient to bring Her machine at the time of the first FU visit, even though this is cumbersome. Bringing the machine for every visit after that will likely not be needed, but often helps for the first visit to troubleshoot if needed. Please re-enforce the importance of compliance with treatment and the need for [place] to monitor compliance data - often an insurance  requirement and actually good feedback for the patient as far as how they are doing.  ?Also remind patient, that any interim PAP machine or mask issues should be first addressed with the DME company, as they can often help better with technical and mask fit issues. Please ask if patient has a preference regarding DME company. ?  ?Please also make sure, the patient has a follow-up appointment with me in about 10 weeks from the setup date, thanks. May see one of our nurse practitioners if needed for proper timing of the FU appointment.  ?Please fax or rout report to the referring provider. Thanks,  ?  ?Star Age, MD, PhD ?[facility])   ? ?

## 2021-07-13 ENCOUNTER — Ambulatory Visit: Payer: 59 | Admitting: Family Medicine

## 2021-07-13 ENCOUNTER — Encounter: Payer: Self-pay | Admitting: Neurology

## 2021-07-17 ENCOUNTER — Telehealth: Payer: 59 | Admitting: Family Medicine

## 2021-07-17 NOTE — Telephone Encounter (Signed)
Order, sleep study results, insurance info we have thus far, and office note faxed to [employer]). Received a receipt of confirmation.

## 2021-07-17 NOTE — Telephone Encounter (Signed)
Insurance card faxed to [employer]. Received a receipt of confirmation.

## 2021-07-21 ENCOUNTER — Other Ambulatory Visit: Payer: Self-pay | Admitting: Family Medicine

## 2021-07-21 DIAGNOSIS — E782 Mixed hyperlipidemia: Secondary | ICD-10-CM

## 2021-07-21 DIAGNOSIS — I10 Essential (primary) hypertension: Secondary | ICD-10-CM

## 2021-07-23 ENCOUNTER — Other Ambulatory Visit: Payer: Self-pay | Admitting: Family Medicine

## 2021-07-23 DIAGNOSIS — R7309 Other abnormal glucose: Secondary | ICD-10-CM

## 2021-07-23 DIAGNOSIS — E669 Obesity, unspecified: Secondary | ICD-10-CM

## 2021-07-24 LAB — HM MAMMOGRAPHY

## 2021-07-25 ENCOUNTER — Encounter: Payer: Self-pay | Admitting: Family Medicine

## 2021-07-27 ENCOUNTER — Other Ambulatory Visit: Payer: Self-pay | Admitting: Family Medicine

## 2021-07-27 DIAGNOSIS — I10 Essential (primary) hypertension: Secondary | ICD-10-CM

## 2021-09-19 ENCOUNTER — Telehealth: Payer: Self-pay | Admitting: Neurology

## 2021-09-19 NOTE — Telephone Encounter (Signed)
Pt called and cancel initial Cpap appointment because DME company has not delivery machine. Pt said DME company stated she should have by the end of the week. Pt said she will call back and re-schedule appointment.

## 2021-09-20 ENCOUNTER — Ambulatory Visit: Payer: 59 | Admitting: Adult Health

## 2021-10-29 ENCOUNTER — Ambulatory Visit (INDEPENDENT_AMBULATORY_CARE_PROVIDER_SITE_OTHER): Payer: 59 | Admitting: Family Medicine

## 2021-10-29 ENCOUNTER — Encounter: Payer: Self-pay | Admitting: Family Medicine

## 2021-10-29 VITALS — BP 126/74 | HR 88 | Temp 97.2°F | Ht 64.0 in | Wt 227.4 lb

## 2021-10-29 DIAGNOSIS — I10 Essential (primary) hypertension: Secondary | ICD-10-CM | POA: Diagnosis not present

## 2021-10-29 DIAGNOSIS — G4733 Obstructive sleep apnea (adult) (pediatric): Secondary | ICD-10-CM | POA: Diagnosis not present

## 2021-10-29 DIAGNOSIS — R7303 Prediabetes: Secondary | ICD-10-CM

## 2021-10-29 DIAGNOSIS — Z1159 Encounter for screening for other viral diseases: Secondary | ICD-10-CM | POA: Diagnosis not present

## 2021-10-29 DIAGNOSIS — E782 Mixed hyperlipidemia: Secondary | ICD-10-CM

## 2021-10-29 DIAGNOSIS — Z6839 Body mass index (BMI) 39.0-39.9, adult: Secondary | ICD-10-CM

## 2021-10-29 DIAGNOSIS — Z0000 Encounter for general adult medical examination without abnormal findings: Secondary | ICD-10-CM

## 2021-10-29 DIAGNOSIS — E66812 Obesity, class 2: Secondary | ICD-10-CM

## 2021-10-29 LAB — HEMOGLOBIN A1C: Hgb A1c MFr Bld: 6.1 % (ref 4.6–6.5)

## 2021-10-29 NOTE — Progress Notes (Signed)
[facility] [address] [place] [address] Dept: [phone] Dept Fax: [phone]  Annual Physical Visit  Subjective:    Patient ID: [name], female    DOB: 1970/03/02, 51 y.o..   MRN: [id number]  Chief Complaint  Patient presents with   Annual Exam    CPE/labs.  Fasting  today.   Fasting today.  C/o having some chest tightness and cough this am.     History of Present Illness:  Patient is in today for an annual physical/preventative visit.  [name] has a history of hypertension, managed with amlodipine 5 mg daily and lisinopril 10 mg daily.    [name] has hyperlipidemia, managed with atorvastatin 20 mg daily.    [name] has prediabetes.    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 She is managing this without meds currently. Had tried ketorolac gel, but did not find this to be helpful.   [name] has been recently diagnosed with sleep apnea. She has obtained her CPAP machine, but need sot go in for titration.  Review of Systems  Constitutional:  Negative for chills, fever, malaise/fatigue and weight loss.  HENT:  Negative for congestion, ear pain, hearing loss, sinus pain and sore throat.   Eyes:  Negative for blurred vision, pain, discharge and redness.  Respiratory:  Negative for cough, shortness of breath and wheezing.   Cardiovascular:  Negative for chest pain, palpitations and leg swelling.       Mild chest tightness in past 1-2 days.  Gastrointestinal:  Positive for abdominal pain and heartburn. Negative for constipation, diarrhea, nausea and vomiting.       Occasional heartburn, esp. with spicy or acidic foods. Avoids these.  Generalized abdominal discomfort that she relates to her prior bowel resection.  Musculoskeletal:  Positive for joint pain. Negative for back pain and neck pain.       Stiffness and mild pain in fingers. Pain in left knee  due to prior arthritis.  Skin:  Negative for itching and rash.  Endo/Heme/Allergies:  Negative for environmental allergies.  Psychiatric/Behavioral:  Negative for depression.     Past Medical History: Patient Active Problem List   Diagnosis Date Noted   Obstructive sleep apnea 06/10/2021   Degenerative joint disease (DJD) of lumbar spine 04/17/2021   Derangement of meniscus of left knee 04/17/2021   Osteoarthritis of left hip 04/17/2021   Centrilobular emphysema ([place]) 06/29/2020   Aortic atherosclerosis ([place]) 06/29/2020   COVID-19 virus infection 10/19/2019   Mixed hyperlipidemia 10/19/2019   Prediabetes 10/08/2018   Class 2 obesity due to excess calories with body mass index (BMI) of 39.0 to 39.9 in adult 03/29/2018   Nonspecific abnormal electrocardiogram (ECG) (EKG) 03/27/2018   Atypical chest pain 03/27/2018   History of hysterectomy, supracervical 02/14/2017   Trigger ring finger of right hand 08/07/2015   Primary osteoarthritis of first carpometacarpal joint of right hand 08/07/2015   Diverticulitis of sigmoid colon s/p colectomy 06/30/2014 07/26/2014   Hx of adenomatous polyp of colon 12/23/2013   Essential hypertension 11/02/2013   Former smoker 11/26/2008   Past Surgical History:  Procedure Laterality Date   ABDOMINAL HYSTERECTOMY     BREAST LUMPECTOMY  10/11   rt-neg   BREAST SURGERY  2004   fibroademoma   CERVICAL CONE BIOPSY  2000   COLON RESECTION N/A 07/06/2014   Procedure: LAPAROSCOPIC LOOP ILEOSTOMY WITH PLACEMENT OF PELVIC DRAIN;  Surgeon: Donnie Mesa, MD;  Location: [facility];  Service: General;  Laterality: N/A;   COLON SURGERY     diverticulitis    COLONOSCOPY     DILATION AND CURETTAGE OF UTERUS  2009   DIVERTING ILEOSTOMY Right 06/2014   ECTOPIC PREGNANCY SURGERY  2005   R salpyngectomy   ILEOSTOMY N/A 10/26/2014   Procedure: LOOP ILEOSTOMY REVERSAL;  Surgeon: Donnie Mesa, MD;  Location: [facility];  Service: General;  Laterality: N/A;   LAPAROSCOPIC SIGMOID  COLECTOMY N/A 06/30/2014   Procedure: LAPAROSCOPIC ASSISTED SIGMOID COLECTOMY;  Surgeon: Donnie Mesa, MD;  Location: [facility];  Service: General;  Laterality: N/A;   MYOMECTOMY  2008   robotic   POLYPECTOMY  2009   ROBOTIC ASSISTED LAP VAGINAL HYSTERECTOMY  2010   supracervical   SALPINGECTOMY  2005   TRIGGER FINGER RELEASE Right 09/28/2012   Procedure: RELEASE TRIGGER FINGER/A-1 PULLEY RIGHT THUMB;  Surgeon: Jolyn Nap, MD;  Location: [facility];  Service: Orthopedics;  Laterality: Right;   ULNAR NERVE TRANSPOSITION Right 12/01/2018   Procedure: ULNAR NERVE DECOMPRESSION;  Surgeon: Daryll Brod, MD;  Location: [facility];  Service: Orthopedics;  Laterality: Right;  AXILLARY   Family History  Problem Relation Age of Onset   Sarcoidosis Mother    Hypertension Mother    Hyperlipidemia Mother    Cancer Father        Prostate   Fibroids Sister    Thyroid disease Maternal Aunt    Heart disease Paternal Uncle    Hypertension Maternal Grandmother    Hypertension Paternal Grandmother    Diabetes Paternal Grandmother    Mental illness Cousin    Colon cancer Neg Hx    Colon polyps Neg Hx    Esophageal cancer Neg Hx    Rectal cancer Neg Hx    Stomach cancer Neg Hx    Sleep apnea Neg Hx    Outpatient Medications Prior to Visit  Medication Sig Dispense Refill   amLODipine (NORVASC) 5 MG tablet TAKE 1 TABLET(5 MG) BY MOUTH DAILY 90 tablet 3   atorvastatin (LIPITOR) 20 MG tablet TAKE 1 TABLET(20 MG) BY MOUTH DAILY 90 tablet 3   Black Cohosh 20 MG TABS Take 40 mg by mouth daily.      lisinopril (ZESTRIL) 10 MG tablet TAKE 1 TABLET(10 MG) BY MOUTH DAILY 90 tablet 3   Multiple Vitamins-Minerals (HAIR/SKIN/NAILS/BIOTIN PO) Take 1 tablet by mouth daily.     nitroGLYCERIN (NITROSTAT) 0.4 MG SL tablet Place 1 tablet (0.4 mg total) under the tongue every 5 (five) minutes as needed for up to 25 days for chest pain. (Patient taking differently: Place 0.4 mg under the  tongue as needed for chest pain.) 25 tablet 1   diclofenac Sodium (VOLTAREN) 1 % GEL Apply 2 g topically 4 (four) times daily. (Patient taking differently: Apply 2 g topically as needed.) 100 g 3   traMADol (ULTRAM) 50 MG tablet Take 1 tablet (50 mg total) by mouth every 8 (eight) hours as needed for severe pain. 15 tablet 0   No facility-administered medications prior to visit.   Allergies  Allergen Reactions   Keflex [Cephalexin] Anaphylaxis   Penicillins Anaphylaxis    Has patient had a PCN reaction causing immediate rash, facial/tongue/throat swelling, SOB or lightheadedness with hypotension: Yes Has patient had a PCN reaction causing severe rash involving mucus membranes or skin necrosis: No Has patient had a PCN reaction that required hospitalization: No Has patient had a PCN  reaction occurring within the last 10 years: No If all of the above answers are "NO", then may proceed with Cephalosporin use.    Objective:   Today's Vitals   10/29/21 1030  BP: 126/74  Pulse: 88  Temp: (!) 97.2 F (36.2 C)  TempSrc: Temporal  SpO2: 95%  Weight: 227 lb 6.4 oz (103.1 kg)  Height: '5\' 4"'$  (1.626 m)   Body mass index is 39.03 kg/m.   General: Well developed, well nourished. No acute distress. HEENT: Normocephalic, non-traumatic. PERRL, EOMI. Conjunctiva clear. External ears normal. EAC and   TMs normal bilaterally. Nose clear without congestion or rhinorrhea. Mucous membranes moist. Oropharynx   clear. Good dentition. Neck: Supple. No lymphadenopathy. No thyromegaly. Lungs: Clear to auscultation bilaterally. No wheezing, rales or rhonchi. CV: RRR without murmurs or rubs. Pulses 2+ bilaterally. Abdomen: Soft. Bowel sounds positive, normal pitch and frequency. No hepatosplenomegaly. Mild diffuse   tenderness. No rebound or guarding. Extremities: Full ROM. No joint swelling or tenderness. Mild crepitance in left knee. No edema noted. Skin: Warm and dry. No rashes. Psych: Alert and  oriented. Normal mood and affect.  Health Maintenance Due  Topic Date Due   Hepatitis C Screening  Never done   INFLUENZA VACCINE  09/18/2021     Lab Results: Lab Results  Component Value Date   CHOL 165 04/17/2021   HDL 66.20 04/17/2021   LDLCALC 85 04/17/2021   LDLDIRECT 92.0 10/04/2019   TRIG 71.0 04/17/2021   CHOLHDL 2 04/17/2021    Assessment & Plan:   1. Annual physical exam Fair health overall. We discussed recommended screenings and immunizations.   2. Encounter for hepatitis C screening test for low risk patient  - HCV Ab w Reflex to Quant PCR  3. Essential hypertension Blood pressure is at goal. Continue amlodipine 5 mg daily and lisinopril 10 mg daily.  4. Obstructive sleep apnea Pending titration of CPAP with   5. Class 2 severe obesity due to excess calories with serious comorbidity and body mass index (BMI) of 39.0 to 39.9 in adult [facility]) Continues to struggle with weight issues. Encouraged regular exercise.  6. Prediabetes We will reassess her A1c today.  - Hemoglobin A1c  7. Mixed hyperlipidemia Lipids have been at goal. Continue atorvastatin 20 mg daily.   Return in about 3 months (around 01/28/2022) for Reassessment.   Haydee Salter, MD

## 2021-10-31 ENCOUNTER — Ambulatory Visit (HOSPITAL_COMMUNITY)
Admission: EM | Admit: 2021-10-31 | Discharge: 2021-10-31 | Disposition: A | Discharge status: Home / Self Care | Payer: 59 | Attending: Physician Assistant | Admitting: Physician Assistant

## 2021-10-31 ENCOUNTER — Encounter (HOSPITAL_COMMUNITY): Payer: Self-pay | Admitting: Emergency Medicine

## 2021-10-31 DIAGNOSIS — J069 Acute upper respiratory infection, unspecified: Secondary | ICD-10-CM | POA: Insufficient documentation

## 2021-10-31 DIAGNOSIS — Z1152 Encounter for screening for COVID-19: Secondary | ICD-10-CM | POA: Insufficient documentation

## 2021-10-31 DIAGNOSIS — J329 Chronic sinusitis, unspecified: Secondary | ICD-10-CM | POA: Insufficient documentation

## 2021-10-31 LAB — POC INFLUENZA A AND B ANTIGEN (URGENT CARE ONLY)
INFLUENZA A ANTIGEN, POC: NEGATIVE
INFLUENZA B ANTIGEN, POC: NEGATIVE

## 2021-10-31 LAB — HCV AB W REFLEX TO QUANT PCR: HCV Ab: NONREACTIVE

## 2021-10-31 LAB — SARS CORONAVIRUS 2 (TAT 6-24 HRS): SARS Coronavirus 2: NEGATIVE

## 2021-10-31 LAB — HCV INTERPRETATION

## 2021-10-31 MED ORDER — DOXYCYCLINE HYCLATE 100 MG PO CAPS
100.0000 mg | ORAL_CAPSULE | Freq: Two times a day (BID) | ORAL | 0 refills | Status: DC
Start: 2021-10-31 — End: 2021-12-08

## 2021-10-31 MED ORDER — ALBUTEROL SULFATE HFA 108 (90 BASE) MCG/ACT IN AERS: 1.0000 | INHALATION_SPRAY | Freq: Four times a day (QID) | RESPIRATORY_TRACT | 0 refills | Status: DC | PRN

## 2021-10-31 NOTE — ED Provider Notes (Signed)
[name]    CSN: [id number] Arrival date & time: 10/31/21  6948      History   Chief Complaint Chief Complaint  Patient presents with   Cough   Sore Throat   Generalized Body Aches   Headache    HPI [name] is a 51 y.o. female.   Patient here today for cough, sore throat, headache, body aches, chest tightness with cough that started Sunday. She reports that mucus production is thick and yellow. She feels she may have sinus infection but notes her presentation with covid pneumonia was also similar. She has not had fever. She has used her inhaler and OTC meds with mild relief. She requests refill of her inhaler. She denies any nausea, vomiting but did have diarrhea. She took an at home covid test today that was negative.   The history is provided by the patient.  Cough Associated symptoms: headaches and sore throat   Associated symptoms: no chills, no ear pain, no eye discharge, no fever, no shortness of breath and no wheezing   Sore Throat Associated symptoms include headaches. Pertinent negatives include no abdominal pain and no shortness of breath.  Headache Associated symptoms: congestion, cough, diarrhea and sore throat   Associated symptoms: no abdominal pain, no ear pain, no fever, no nausea and no vomiting     Past Medical History:  Diagnosis Date   Abnormal Pap smear 2000   [facility] Biopsy   Acute respiratory failure with hypoxia ([place]) 10/19/2019   Allergy    Arthritis    hands   Back pain    Blood transfusion without reported diagnosis 2005   Breast fibroadenoma    Diverticulitis of colon with perforation 05/20/2014   GERD (gastroesophageal reflux disease)    H/O metrorrhagia 12/2006   H/O myomectomy 07-2006   robotic   H/O sinusitis    Headache(784.0)    Heart murmur    History of conization of cervix 2000   History of ectopic pregnancy 2005   r salpyngectomy   History of hysterectomy, supracervical 08-2008   robotic   History of  syphilis    History of syphilis    Hx of adenomatous polyp of colon 12/31/2017   Hx of herpes simplex type 2 infection    Hyperlipidemia    Hypertension    Ileostomy present ([place]) 10/26/2014   Intra-abdominal abscess ([place]) 07/21/2014   PMS (premenstrual syndrome)     Patient Active Problem List   Diagnosis Date Noted   Obstructive sleep apnea 06/10/2021   Degenerative joint disease (DJD) of lumbar spine 04/17/2021   Derangement of meniscus of left knee 04/17/2021   Osteoarthritis of left hip 04/17/2021   Centrilobular emphysema ([place]) 06/29/2020   Aortic atherosclerosis ([place]) 06/29/2020   COVID-19 virus infection 10/19/2019   Mixed hyperlipidemia 10/19/2019   Prediabetes 10/08/2018   Class 2 obesity due to excess calories with body mass index (BMI) of 39.0 to 39.9 in adult 03/29/2018   Nonspecific abnormal electrocardiogram (ECG) (EKG) 03/27/2018   Atypical chest pain 03/27/2018   History of hysterectomy, supracervical 02/14/2017   Trigger ring finger of right hand 08/07/2015   Primary osteoarthritis of first carpometacarpal joint of right hand 08/07/2015   Diverticulitis of sigmoid colon s/p colectomy 06/30/2014 07/26/2014   Hx of adenomatous polyp of colon 12/23/2013   Essential hypertension 11/02/2013   Former smoker 11/26/2008    Past Surgical History:  Procedure Laterality Date   ABDOMINAL HYSTERECTOMY     BREAST LUMPECTOMY  10/11   rt-neg   BREAST SURGERY  2004   fibroademoma   CERVICAL CONE BIOPSY  2000   COLON RESECTION N/A 07/06/2014   Procedure: LAPAROSCOPIC LOOP ILEOSTOMY WITH PLACEMENT OF PELVIC DRAIN;  Surgeon: Donnie Mesa, MD;  Location: [facility];  Service: General;  Laterality: N/A;   COLON SURGERY     diverticulitis    COLONOSCOPY     DILATION AND CURETTAGE OF UTERUS  2009   DIVERTING ILEOSTOMY Right 06/2014   ECTOPIC PREGNANCY SURGERY  2005   R salpyngectomy   ILEOSTOMY N/A 10/26/2014   Procedure: LOOP ILEOSTOMY REVERSAL;  Surgeon: Donnie Mesa, MD;   Location: [facility];  Service: General;  Laterality: N/A;   LAPAROSCOPIC SIGMOID COLECTOMY N/A 06/30/2014   Procedure: LAPAROSCOPIC ASSISTED SIGMOID COLECTOMY;  Surgeon: Donnie Mesa, MD;  Location: [facility];  Service: General;  Laterality: N/A;   MYOMECTOMY  2008   robotic   POLYPECTOMY  2009   ROBOTIC ASSISTED LAP VAGINAL HYSTERECTOMY  2010   supracervical   SALPINGECTOMY  2005   TRIGGER FINGER RELEASE Right 09/28/2012   Procedure: RELEASE TRIGGER FINGER/A-1 PULLEY RIGHT THUMB;  Surgeon: Jolyn Nap, MD;  Location: [facility];  Service: Orthopedics;  Laterality: Right;   ULNAR NERVE TRANSPOSITION Right 12/01/2018   Procedure: ULNAR NERVE DECOMPRESSION;  Surgeon: Daryll Brod, MD;  Location: [facility];  Service: Orthopedics;  Laterality: Right;  AXILLARY    OB History     Gravida  4   Para  2   Term  2   Preterm      AB  2   Living  2      SAB  0   IAB  1   Ectopic  1   Multiple  0   Live Births               Home Medications    Prior to Admission medications   Medication Sig Start Date End Date Taking? Authorizing Provider  albuterol (VENTOLIN HFA) 108 (90 Base) MCG/ACT inhaler Inhale 1-2 puffs into the lungs every 6 (six) hours as needed for wheezing or shortness of breath. 10/31/21  Yes Francene Finders, PA-C  doxycycline (VIBRAMYCIN) 100 MG capsule Take 1 capsule (100 mg total) by mouth 2 (two) times daily. 10/31/21  Yes Francene Finders, PA-C  amLODipine (NORVASC) 5 MG tablet TAKE 1 TABLET(5 MG) BY MOUTH DAILY 07/21/21   Haydee Salter, MD  atorvastatin (LIPITOR) 20 MG tablet TAKE 1 TABLET(20 MG) BY MOUTH DAILY 07/21/21   Haydee Salter, MD  Black Cohosh 20 MG TABS Take 40 mg by mouth daily.     [provider]  lisinopril (ZESTRIL) 10 MG tablet TAKE 1 TABLET(10 MG) BY MOUTH DAILY 07/27/21   Haydee Salter, MD  Multiple Vitamins-Minerals (HAIR/SKIN/NAILS/BIOTIN PO) Take 1 tablet by mouth daily.    [provider]   nitroGLYCERIN (NITROSTAT) 0.4 MG SL tablet Place 1 tablet (0.4 mg total) under the tongue every 5 (five) minutes as needed for up to 25 days for chest pain. Patient taking differently: Place 0.4 mg under the tongue as needed for chest pain. 02/14/21 10/29/21  Adrian Prows, MD    Family History Family History  Problem Relation Age of Onset   Sarcoidosis Mother    Hypertension Mother    Hyperlipidemia Mother    Cancer Father        Prostate   Fibroids Sister    Thyroid disease Maternal  Aunt    Heart disease Paternal Uncle    Hypertension Maternal Grandmother    Hypertension Paternal Grandmother    Diabetes Paternal Grandmother    Mental illness Cousin    Colon cancer Neg Hx    Colon polyps Neg Hx    Esophageal cancer Neg Hx    Rectal cancer Neg Hx    Stomach cancer Neg Hx    Sleep apnea Neg Hx     Social History Social History   Tobacco Use   Smoking status: Former    Packs/day: 1.00    Years: 20.00    Total pack years: 20.00    Types: Cigarettes    Quit date: 06/13/2011    Years since quitting: 10.3   Smokeless tobacco: Never  Vaping Use   Vaping Use: Never used  Substance Use Topics   Alcohol use: Not Currently   Drug use: No     Allergies   Keflex [cephalexin] and Penicillins   Review of Systems Review of Systems  Constitutional:  Negative for chills and fever.  HENT:  Positive for congestion and sore throat. Negative for ear pain.   Eyes:  Negative for discharge and redness.  Respiratory:  Positive for cough and chest tightness. Negative for shortness of breath and wheezing.   Gastrointestinal:  Positive for diarrhea. Negative for abdominal pain, nausea and vomiting.  Neurological:  Positive for headaches.     Physical Exam Triage Vital Signs ED Triage Vitals  Enc Vitals Group     BP 10/31/21 0933 131/74     Pulse Rate 10/31/21 0933 96     Resp 10/31/21 0933 18     Temp 10/31/21 0933 98.5 F (36.9 C)     Temp Source 10/31/21 0933 Oral     SpO2  10/31/21 0933 99 %     Weight --      Height --      Head Circumference --      Peak Flow --      Pain Score 10/31/21 0931 2     Pain Loc --      Pain Edu? --      Excl. in [place]? --    No data found.  Updated Vital Signs BP 131/74 (BP Location: Right Arm)   Pulse 96   Temp 98.5 F (36.9 C) (Oral)   Resp 18   SpO2 99%      Physical Exam Vitals and nursing note reviewed.  Constitutional:      General: She is not in acute distress.    Appearance: Normal appearance. She is not ill-appearing.  HENT:     Head: Normocephalic and atraumatic.     Nose: Congestion present.     Mouth/Throat:     Mouth: Mucous membranes are moist.     Pharynx: No oropharyngeal exudate or posterior oropharyngeal erythema.  Eyes:     Conjunctiva/sclera: Conjunctivae normal.  Cardiovascular:     Rate and Rhythm: Normal rate and regular rhythm.     Heart sounds: Normal heart sounds. No murmur heard. Pulmonary:     Effort: Pulmonary effort is normal. No respiratory distress.     Breath sounds: Normal breath sounds. No wheezing, rhonchi or rales.  Skin:    General: Skin is warm and dry.  Neurological:     Mental Status: She is alert.  Psychiatric:        Mood and Affect: Mood normal.        Thought Content: Thought content normal.  UC Treatments / Results  Labs (all labs ordered are listed, but only abnormal results are displayed) Labs Reviewed  SARS CORONAVIRUS 2 (TAT 6-24 HRS)  POC INFLUENZA A AND B ANTIGEN (URGENT CARE ONLY)    EKG   Radiology No results found.  Procedures Procedures (including critical care time)  Medications Ordered in UC Medications - No data to display  Initial Impression / Assessment and Plan / UC Course  I have reviewed the triage vital signs and the nursing notes.  Pertinent labs & imaging results that were available during my care of the patient were reviewed by me and considered in my medical decision making (see chart for details).     Doxycycline prescribed to cover sinusitis. Will rescreen for covid with pcr, recommend continued symptomatic treatment, increased fluids and rest, albuterol refilled. Encouraged follow up if no gradual improvement of symptoms or with any worsening.   Final Clinical Impressions(s) / UC Diagnoses   Final diagnoses:  Viral upper respiratory tract infection  Chronic sinusitis, unspecified location  Encounter for screening for COVID-19   Discharge Instructions   None    ED Prescriptions     Medication Sig Dispense Auth. Provider   doxycycline (VIBRAMYCIN) 100 MG capsule Take 1 capsule (100 mg total) by mouth 2 (two) times daily. 20 capsule Ewell Poe F, PA-C   albuterol (VENTOLIN HFA) 108 (90 Base) MCG/ACT inhaler Inhale 1-2 puffs into the lungs every 6 (six) hours as needed for wheezing or shortness of breath. 8 g Francene Finders, PA-C      PDMP not reviewed this encounter.   Francene Finders, PA-C 10/31/21 1051

## 2021-10-31 NOTE — ED Triage Notes (Signed)
Pt reports a cough, sore throat, headache, body aches and pain in chest when coughing since Sunday. Has used inhaler , denies OTC medication use.

## 2021-11-01 ENCOUNTER — Telehealth: Payer: Self-pay

## 2021-11-01 ENCOUNTER — Encounter: Payer: Self-pay | Admitting: Family Medicine

## 2021-11-01 NOTE — Telephone Encounter (Signed)
error 

## 2021-11-01 NOTE — Telephone Encounter (Signed)
Patient would like to get an Rx for Pantoprazole.  Please review and advise.  Thanks. Jaynie Collins, cma

## 2021-11-01 NOTE — Telephone Encounter (Signed)
Patient notified [facility] phone.  She will just buy the omeprazole OTC.  Dm/cma

## 2021-11-02 ENCOUNTER — Ambulatory Visit (HOSPITAL_COMMUNITY)
Admission: EM | Admit: 2021-11-02 | Discharge: 2021-11-02 | Disposition: A | Discharge status: Home / Self Care | Payer: 59 | Attending: Emergency Medicine | Admitting: Emergency Medicine

## 2021-11-02 ENCOUNTER — Ambulatory Visit (INDEPENDENT_AMBULATORY_CARE_PROVIDER_SITE_OTHER): Payer: 59

## 2021-11-02 ENCOUNTER — Encounter (HOSPITAL_COMMUNITY): Payer: Self-pay

## 2021-11-02 DIAGNOSIS — J0100 Acute maxillary sinusitis, unspecified: Secondary | ICD-10-CM | POA: Diagnosis not present

## 2021-11-02 DIAGNOSIS — J209 Acute bronchitis, unspecified: Secondary | ICD-10-CM

## 2021-11-02 DIAGNOSIS — R059 Cough, unspecified: Secondary | ICD-10-CM | POA: Diagnosis not present

## 2021-11-02 MED ORDER — AEROCHAMBER MV MISC: 1 refills | Status: DC

## 2021-11-02 MED ORDER — PREDNISONE 20 MG PO TABS
60.0000 mg | ORAL_TABLET | Freq: Once | ORAL | Status: AC
  Administered 2021-11-02: 60 mg via ORAL

## 2021-11-02 MED ORDER — IPRATROPIUM-ALBUTEROL 0.5-2.5 (3) MG/3ML IN SOLN
RESPIRATORY_TRACT | Status: AC
  Filled 2021-11-02: qty 3

## 2021-11-02 MED ORDER — PROMETHAZINE-DM 6.25-15 MG/5ML PO SYRP: 5.0000 mL | ORAL_SOLUTION | Freq: Four times a day (QID) | ORAL | 0 refills | Status: DC | PRN

## 2021-11-02 MED ORDER — LEVOFLOXACIN 750 MG PO TABS: 750.0000 mg | ORAL_TABLET | Freq: Every day | ORAL | 0 refills | Status: AC

## 2021-11-02 MED ORDER — PREDNISONE 20 MG PO TABS: 40.0000 mg | ORAL_TABLET | Freq: Every day | ORAL | 0 refills | Status: AC

## 2021-11-02 MED ORDER — PREDNISONE 20 MG PO TABS
ORAL_TABLET | ORAL | Status: AC
  Filled 2021-11-02: qty 3

## 2021-11-02 MED ORDER — IPRATROPIUM-ALBUTEROL 0.5-2.5 (3) MG/3ML IN SOLN
3.0000 mL | Freq: Once | RESPIRATORY_TRACT | Status: AC
  Administered 2021-11-02: 3 mL via RESPIRATORY_TRACT

## 2021-11-02 NOTE — Discharge Instructions (Addendum)
2 puffs from your albuterol inhaler using your spacer every 4 hours as needed for 2 days, then every 6 hours for 2 days, then as needed.  You can back off the albuterol if you start to improve sooner.  Promethazine DM for cough.  Finish prednisone, even if you feel better.  I am giving you a wait-and-see prescription of Levaquin.  Continue the doxycycline for now.  If you are not better in 2 days, then go ahead and start the Levaquin.  Your chest x-ray was negative for pneumonia.

## 2021-11-02 NOTE — ED Triage Notes (Signed)
Pt states cough,congestion,sore throat,congestion for the past 5 days. Seen here 2 days ago for same. Has been taking doxycyline but states she is not feeling any better.

## 2021-11-02 NOTE — ED Provider Notes (Signed)
HPI  SUBJECTIVE:  [name] is a 51 y.o. female who presents with 5 to 6 days of sneezing, cough productive of yellow, thick mucus, burning chest pain, chest soreness secondary to cough, headache, postnasal drip, increased effort to breathe.  No fevers, sinus pain or pressure, nasal congestion, wheezing. Patient was seen here 2 days ago on 9/13 for this.  Thought to have chronic sinusitis, sent home on doxycycline, albuterol.  COVID, influenza were negative.  She is here today because she is not any better despite 2 days of antibiotics.  She states this feels similar to the COVID-pneumonia she had in 2021.  She has been taking doxycycline as directed, doing 1 to 2 puffs of albuterol every 6 hours, and Tylenol Sinus without improvement in her symptoms.  Symptoms are worse with activity.  She is unable to sleep at night secondary to the cough.  She has a past medical history of hypertension, BMI above 30, COVID 2021 and 2022, emphysema, hypercholesterolemia, prediabetes.  PCP: [place] [facility].  She has anaphylaxis with Keflex and penicillin.  Past Medical History:  Diagnosis Date   Abnormal Pap smear 2000   [facility] Biopsy   Acute respiratory failure with hypoxia ([place]) 10/19/2019   Allergy    Arthritis    hands   Back pain    Blood transfusion without reported diagnosis 2005   Breast fibroadenoma    Diverticulitis of colon with perforation 05/20/2014   GERD (gastroesophageal reflux disease)    H/O metrorrhagia 12/2006   H/O myomectomy 07-2006   robotic   H/O sinusitis    Headache(784.0)    Heart murmur    History of conization of cervix 2000   History of ectopic pregnancy 2005   r salpyngectomy   History of hysterectomy, supracervical 08-2008   robotic   History of syphilis    History of syphilis    Hx of adenomatous polyp of colon 12/31/2017   Hx of herpes simplex type 2 infection    Hyperlipidemia    Hypertension    Ileostomy present ([place]) 10/26/2014   Intra-abdominal abscess  ([place]) 07/21/2014   PMS (premenstrual syndrome)     Past Surgical History:  Procedure Laterality Date   ABDOMINAL HYSTERECTOMY     BREAST LUMPECTOMY  10/11   rt-neg   BREAST SURGERY  2004   fibroademoma   CERVICAL CONE BIOPSY  2000   COLON RESECTION N/A 07/06/2014   Procedure: LAPAROSCOPIC LOOP ILEOSTOMY WITH PLACEMENT OF PELVIC DRAIN;  Surgeon: Donnie Mesa, MD;  Location: [facility];  Service: General;  Laterality: N/A;   COLON SURGERY     diverticulitis    COLONOSCOPY     DILATION AND CURETTAGE OF UTERUS  2009   DIVERTING ILEOSTOMY Right 06/2014   ECTOPIC PREGNANCY SURGERY  2005   R salpyngectomy   ILEOSTOMY N/A 10/26/2014   Procedure: LOOP ILEOSTOMY REVERSAL;  Surgeon: Donnie Mesa, MD;  Location: [facility];  Service: General;  Laterality: N/A;   LAPAROSCOPIC SIGMOID COLECTOMY N/A 06/30/2014   Procedure: LAPAROSCOPIC ASSISTED SIGMOID COLECTOMY;  Surgeon: Donnie Mesa, MD;  Location: [facility];  Service: General;  Laterality: N/A;   MYOMECTOMY  2008   robotic   POLYPECTOMY  2009   ROBOTIC ASSISTED LAP VAGINAL HYSTERECTOMY  2010   supracervical   SALPINGECTOMY  2005   TRIGGER FINGER RELEASE Right 09/28/2012   Procedure: RELEASE TRIGGER FINGER/A-1 PULLEY RIGHT THUMB;  Surgeon: Jolyn Nap, MD;  Location: [facility];  Service: Orthopedics;  Laterality:  Right;   ULNAR NERVE TRANSPOSITION Right 12/01/2018   Procedure: ULNAR NERVE DECOMPRESSION;  Surgeon: Daryll Brod, MD;  Location: [facility];  Service: Orthopedics;  Laterality: Right;  AXILLARY    Family History  Problem Relation Age of Onset   Sarcoidosis Mother    Hypertension Mother    Hyperlipidemia Mother    Cancer Father        Prostate   Fibroids Sister    Thyroid disease Maternal Aunt    Heart disease Paternal Uncle    Hypertension Maternal Grandmother    Hypertension Paternal Grandmother    Diabetes Paternal Grandmother    Mental illness Cousin    Colon cancer Neg Hx    Colon polyps Neg Hx     Esophageal cancer Neg Hx    Rectal cancer Neg Hx    Stomach cancer Neg Hx    Sleep apnea Neg Hx     Social History   Tobacco Use   Smoking status: Former    Packs/day: 1.00    Years: 20.00    Total pack years: 20.00    Types: Cigarettes    Quit date: 06/13/2011    Years since quitting: 10.3   Smokeless tobacco: Never  Vaping Use   Vaping Use: Never used  Substance Use Topics   Alcohol use: Not Currently   Drug use: No    No current facility-administered medications for this encounter.  Current Outpatient Medications:    levofloxacin (LEVAQUIN) 750 MG tablet, Take 1 tablet (750 mg total) by mouth daily for 7 days., Disp: 7 tablet, Rfl: 0   predniSONE (DELTASONE) 20 MG tablet, Take 2 tablets (40 mg total) by mouth daily with breakfast for 5 days., Disp: 10 tablet, Rfl: 0   promethazine-dextromethorphan (PROMETHAZINE-DM) 6.25-15 MG/5ML syrup, Take 5 mLs by mouth 4 (four) times daily as needed for cough., Disp: 118 mL, Rfl: 0   Spacer/Aero-Holding Chambers (AEROCHAMBER MV) inhaler, Use as instructed, Disp: 1 each, Rfl: 1   albuterol (VENTOLIN HFA) 108 (90 Base) MCG/ACT inhaler, Inhale 1-2 puffs into the lungs every 6 (six) hours as needed for wheezing or shortness of breath., Disp: 8 g, Rfl: 0   amLODipine (NORVASC) 5 MG tablet, TAKE 1 TABLET(5 MG) BY MOUTH DAILY, Disp: 90 tablet, Rfl: 3   atorvastatin (LIPITOR) 20 MG tablet, TAKE 1 TABLET(20 MG) BY MOUTH DAILY, Disp: 90 tablet, Rfl: 3   Black Cohosh 20 MG TABS, Take 40 mg by mouth daily. , Disp: , Rfl:    doxycycline (VIBRAMYCIN) 100 MG capsule, Take 1 capsule (100 mg total) by mouth 2 (two) times daily., Disp: 20 capsule, Rfl: 0   lisinopril (ZESTRIL) 10 MG tablet, TAKE 1 TABLET(10 MG) BY MOUTH DAILY, Disp: 90 tablet, Rfl: 3   Multiple Vitamins-Minerals (HAIR/SKIN/NAILS/BIOTIN PO), Take 1 tablet by mouth daily., Disp: , Rfl:    nitroGLYCERIN (NITROSTAT) 0.4 MG SL tablet, Place 1 tablet (0.4 mg total) under the tongue every 5  (five) minutes as needed for up to 25 days for chest pain. (Patient taking differently: Place 0.4 mg under the tongue as needed for chest pain.), Disp: 25 tablet, Rfl: 1  Allergies  Allergen Reactions   Keflex [Cephalexin] Anaphylaxis   Penicillins Anaphylaxis    Has patient had a PCN reaction causing immediate rash, facial/tongue/throat swelling, SOB or lightheadedness with hypotension: Yes Has patient had a PCN reaction causing severe rash involving mucus membranes or skin necrosis: No Has patient had a PCN reaction that required hospitalization: No Has  patient had a PCN reaction occurring within the last 10 years: No If all of the above answers are "NO", then may proceed with Cephalosporin use.      ROS  As noted in HPI.   Physical Exam  BP 102/63 (BP Location: Right Arm)   Pulse (!) 102   Temp 98.2 F (36.8 C) (Oral)   Resp 16   SpO2 100%   Constitutional: Well developed, well nourished, no acute distress.  Coughing. Eyes:  EOMI, conjunctiva normal bilaterally HENT: Normocephalic, atraumatic,mucus membranes moist.  No nasal congestion.  Positive maxillary sinus tenderness.  No frontal sinus tenderness.  No postnasal drip. Respiratory: Normal inspiratory effort, poor air movement, lungs clear bilaterally.  No anterior, lateral chest wall tenderness  cardiovascular: Regular tachycardia, no murmurs, rubs, gallops GI: nondistended skin: No rash, skin intact Musculoskeletal: no deformities Neurologic: Alert & oriented x 3, no focal neuro deficits Psychiatric: Speech and behavior appropriate   ED Course   Medications  ipratropium-albuterol (DUONEB) 0.5-2.5 (3) MG/3ML nebulizer solution 3 mL (3 mLs Nebulization Given 11/02/21 0841)  predniSONE (DELTASONE) tablet 60 mg (60 mg Oral Given 11/02/21 0842)    Orders Placed This Encounter  Procedures   DG Chest 2 View    Standing Status:   Standing    Number of Occurrences:   1    Order Specific Question:   Reason for Exam  (SYMPTOM  OR DIAGNOSIS REQUIRED)    Answer:   Productive cough 5 days rule out pneumonia    No results found for this or any previous visit (from the past 24 hour(s)). DG Chest 2 View  Result Date: 11/02/2021 CLINICAL DATA:  Productive cough for 5 days. Evaluate for pneumonia. EXAM: CHEST - 2 VIEW COMPARISON:  Prior chest x-ray 11/22/2019 FINDINGS: The lungs are clear and negative for focal airspace consolidation, pulmonary edema or suspicious pulmonary nodule. No pleural effusion or pneumothorax. Cardiac and mediastinal contours are within normal limits. No acute fracture or lytic or blastic osseous lesions. The visualized upper abdominal bowel gas pattern is unremarkable. IMPRESSION: No active cardiopulmonary disease. Electronically Signed   By: Jacqulynn Cadet M.D.   On: 11/02/2021 09:07    ED Clinical Impression  1. Acute non-recurrent maxillary sinusitis   2. Acute bronchitis, unspecified organism      ED Assessment/Plan    We will check chest x-ray to evaluate for pneumonia.  Giving DuoNeb and 60 mg of prednisone.  Previous records reviewed.  As noted in HPI.  Imaging independently reviewed.  No pneumonia on chest x-ray.  See radiology report for details.  Chest x-ray negative for pneumonia.  On reevaluation, she reports improvement after the DuoNeb and prednisone.  Lungs are clear, improved air movement.    Presentation consistent with a bronchitis/sinusitis.  Home with regularly scheduled albuterol inhaler with a spacer, Promethazine DM, 40 mg of prednisone for 5 days, and a wait-and-see prescription of Levaquin 750 mg for 5 days.  She is to discontinue the doxycycline if she starts Levaquin  Discussed imaging, MDM, treatment plan, and plan for follow-up with patient. Discussed sn/sx that should prompt return to the ED. patient agrees with plan.   Meds ordered this encounter  Medications   ipratropium-albuterol (DUONEB) 0.5-2.5 (3) MG/3ML nebulizer solution 3 mL    predniSONE (DELTASONE) tablet 60 mg   levofloxacin (LEVAQUIN) 750 MG tablet    Sig: Take 1 tablet (750 mg total) by mouth daily for 7 days.    Dispense:  7 tablet  Refill:  0   promethazine-dextromethorphan (PROMETHAZINE-DM) 6.25-15 MG/5ML syrup    Sig: Take 5 mLs by mouth 4 (four) times daily as needed for cough.    Dispense:  118 mL    Refill:  0   Spacer/Aero-Holding Chambers (AEROCHAMBER MV) inhaler    Sig: Use as instructed    Dispense:  1 each    Refill:  1   predniSONE (DELTASONE) 20 MG tablet    Sig: Take 2 tablets (40 mg total) by mouth daily with breakfast for 5 days.    Dispense:  10 tablet    Refill:  0      *This clinic note was created using [employer]. Therefore, there may be occasional mistakes despite careful proofreading.  ?    Melynda Ripple, MD 11/02/21 [phone]

## 2021-11-13 ENCOUNTER — Encounter: Payer: Self-pay | Admitting: Gastroenterology

## 2021-11-14 LAB — HM DIABETES EYE EXAM

## 2021-11-15 ENCOUNTER — Encounter: Payer: Self-pay | Admitting: Family Medicine

## 2021-11-19 ENCOUNTER — Encounter: Payer: Self-pay | Admitting: Family Medicine

## 2021-11-19 DIAGNOSIS — H40013 Open angle with borderline findings, low risk, bilateral: Secondary | ICD-10-CM | POA: Insufficient documentation

## 2021-11-19 DIAGNOSIS — H35031 Hypertensive retinopathy, right eye: Secondary | ICD-10-CM | POA: Insufficient documentation

## 2021-12-08 ENCOUNTER — Ambulatory Visit (HOSPITAL_COMMUNITY)
Admission: RE | Admit: 2021-12-08 | Discharge: 2021-12-08 | Disposition: A | Discharge status: Home / Self Care | Payer: 59 | Source: Ambulatory Visit | Attending: Physician Assistant | Admitting: Physician Assistant

## 2021-12-08 ENCOUNTER — Encounter (HOSPITAL_COMMUNITY): Payer: Self-pay

## 2021-12-08 VITALS — BP 152/91 | HR 87 | Temp 98.1°F | Resp 16

## 2021-12-08 DIAGNOSIS — K0889 Other specified disorders of teeth and supporting structures: Secondary | ICD-10-CM | POA: Diagnosis not present

## 2021-12-08 DIAGNOSIS — K047 Periapical abscess without sinus: Secondary | ICD-10-CM

## 2021-12-08 MED ORDER — CLINDAMYCIN HCL 300 MG PO CAPS: 300.0000 mg | ORAL_CAPSULE | Freq: Three times a day (TID) | ORAL | 0 refills | Status: DC

## 2021-12-08 MED ORDER — IBUPROFEN 800 MG PO TABS: 800.0000 mg | ORAL_TABLET | Freq: Three times a day (TID) | ORAL | 0 refills | Status: DC

## 2021-12-08 MED ORDER — HYDROCODONE-ACETAMINOPHEN 5-325 MG PO TABS: 2.0000 | ORAL_TABLET | Freq: Two times a day (BID) | ORAL | 0 refills | Status: AC | PRN

## 2021-12-08 NOTE — ED Triage Notes (Signed)
Pt c/o dental pain and pressure for several days.

## 2021-12-08 NOTE — Discharge Instructions (Addendum)
Start clindamycin for infection.  Use ibuprofen 800 mg for pain.  Do not take NSAIDs including aspirin, ibuprofen/Advil, naproxen/Aleve with this medication due to risk of GI bleeding.  You can use Tylenol/acetaminophen for symptom relief.  I have called in a few doses of hydrocodone.  This is sedating and can be addictive.  Please limit use of this medication is much as possible.  Do not drive or drink alcohol with taking it.  You will ultimately need to see a dentist.  Please call to schedule an appointment soon as possible.  If anything changes and you have fever, nausea/vomiting, swelling of your throat, shortness of breath, trouble swallowing, muffled voice you need to go to the emergency room.

## 2021-12-08 NOTE — ED Provider Notes (Signed)
[place]    CSN: [id number] Arrival date & time: 12/08/21  1318      History   Chief Complaint Chief Complaint  Patient presents with   Dental Problem    Toothache unable to eat.  Sensitivity to hot and cold.  Throbbing constant pain radiating into my left ear and head. Pain and pressure causing my eyes to water. - Entered by patient    HPI [name] is a 51 y.o. female.   Patient presents today with a prolonged history of intermittent dentalgia involving her right upper molars.  Reports that she has a known area of decay between the 2 teeth and is in touch with her dentist regarding next steps including seeing an oral surgeon for extraction.  Reports that she has had ongoing sensitivity and pain in this area but this has significantly worsened in the past several days.  She reports that pain is severe, described as throbbing, worse with mastication, no alleviating factors identified.  She has been able to eat and drink despite symptoms.  She has tried Tylenol ibuprofen without improvement of symptoms.  She is having difficulty sleeping at night due to severity of pain.  Denies any fever, nausea, vomiting, throat swelling, muffled voice, dysphagia.    Past Medical History:  Diagnosis Date   Abnormal Pap smear 2000   [facility] Biopsy   Acute respiratory failure with hypoxia ([place]) 10/19/2019   Allergy    Arthritis    hands   Back pain    Blood transfusion without reported diagnosis 2005   Breast fibroadenoma    Diverticulitis of colon with perforation 05/20/2014   GERD (gastroesophageal reflux disease)    H/O metrorrhagia 12/2006   H/O myomectomy 07-2006   robotic   H/O sinusitis    Headache(784.0)    Heart murmur    History of conization of cervix 2000   History of ectopic pregnancy 2005   r salpyngectomy   History of hysterectomy, supracervical 08-2008   robotic   History of syphilis    History of syphilis    Hx of adenomatous polyp of colon 12/31/2017    Hx of herpes simplex type 2 infection    Hyperlipidemia    Hypertension    Ileostomy present ([place]) 10/26/2014   Intra-abdominal abscess ([place]) 07/21/2014   PMS (premenstrual syndrome)     Patient Active Problem List   Diagnosis Date Noted   Hypertensive retinopathy of right eye 11/19/2021   Open angle with borderline findings and low glaucoma risk in both eyes 11/19/2021   Obstructive sleep apnea 06/10/2021   Degenerative joint disease (DJD) of lumbar spine 04/17/2021   Derangement of meniscus of left knee 04/17/2021   Osteoarthritis of left hip 04/17/2021   Centrilobular emphysema ([place]) 06/29/2020   Aortic atherosclerosis ([place]) 06/29/2020   COVID-19 virus infection 10/19/2019   Mixed hyperlipidemia 10/19/2019   Prediabetes 10/08/2018   Class 2 obesity due to excess calories with body mass index (BMI) of 39.0 to 39.9 in adult 03/29/2018   Nonspecific abnormal electrocardiogram (ECG) (EKG) 03/27/2018   Atypical chest pain 03/27/2018   History of hysterectomy, supracervical 02/14/2017   Trigger ring finger of right hand 08/07/2015   Primary osteoarthritis of first carpometacarpal joint of right hand 08/07/2015   Diverticulitis of sigmoid colon s/p colectomy 06/30/2014 07/26/2014   Hx of adenomatous polyp of colon 12/23/2013   Essential hypertension 11/02/2013   Former smoker 11/26/2008    Past Surgical History:  Procedure Laterality Date  ABDOMINAL HYSTERECTOMY     BREAST LUMPECTOMY  10/11   rt-neg   BREAST SURGERY  2004   fibroademoma   CERVICAL CONE BIOPSY  2000   COLON RESECTION N/A 07/06/2014   Procedure: LAPAROSCOPIC LOOP ILEOSTOMY WITH PLACEMENT OF PELVIC DRAIN;  Surgeon: Donnie Mesa, MD;  Location: [facility];  Service: General;  Laterality: N/A;   COLON SURGERY     diverticulitis    COLONOSCOPY     DILATION AND CURETTAGE OF UTERUS  2009   DIVERTING ILEOSTOMY Right 06/2014   ECTOPIC PREGNANCY SURGERY  2005   R salpyngectomy   ILEOSTOMY N/A 10/26/2014   Procedure: LOOP  ILEOSTOMY REVERSAL;  Surgeon: Donnie Mesa, MD;  Location: [facility];  Service: General;  Laterality: N/A;   LAPAROSCOPIC SIGMOID COLECTOMY N/A 06/30/2014   Procedure: LAPAROSCOPIC ASSISTED SIGMOID COLECTOMY;  Surgeon: Donnie Mesa, MD;  Location: [facility];  Service: General;  Laterality: N/A;   MYOMECTOMY  2008   robotic   POLYPECTOMY  2009   ROBOTIC ASSISTED LAP VAGINAL HYSTERECTOMY  2010   supracervical   SALPINGECTOMY  2005   TRIGGER FINGER RELEASE Right 09/28/2012   Procedure: RELEASE TRIGGER FINGER/A-1 PULLEY RIGHT THUMB;  Surgeon: Jolyn Nap, MD;  Location: [facility];  Service: Orthopedics;  Laterality: Right;   ULNAR NERVE TRANSPOSITION Right 12/01/2018   Procedure: ULNAR NERVE DECOMPRESSION;  Surgeon: Daryll Brod, MD;  Location: [facility];  Service: Orthopedics;  Laterality: Right;  AXILLARY    OB History     Gravida  4   Para  2   Term  2   Preterm      AB  2   Living  2      SAB  0   IAB  1   Ectopic  1   Multiple  0   Live Births               Home Medications    Prior to Admission medications   Medication Sig Start Date End Date Taking? Authorizing Provider  clindamycin (CLEOCIN) 300 MG capsule Take 1 capsule (300 mg total) by mouth 3 (three) times daily. 12/08/21  Yes Deaundre Allston, Derry Skill, PA-C  HYDROcodone-acetaminophen (NORCO/VICODIN) 5-325 MG tablet Take 2 tablets by mouth 2 (two) times daily as needed for up to 2 days. 12/08/21 12/10/21 Yes Cyndia Degraff K, PA-C  ibuprofen (ADVIL) 800 MG tablet Take 1 tablet (800 mg total) by mouth 3 (three) times daily. 12/08/21  Yes Kieana Livesay K, PA-C  albuterol (VENTOLIN HFA) 108 (90 Base) MCG/ACT inhaler Inhale 1-2 puffs into the lungs every 6 (six) hours as needed for wheezing or shortness of breath. 10/31/21   Francene Finders, PA-C  amLODipine (NORVASC) 5 MG tablet TAKE 1 TABLET(5 MG) BY MOUTH DAILY 07/21/21   Haydee Salter, MD  atorvastatin (LIPITOR) 20 MG tablet TAKE 1  TABLET(20 MG) BY MOUTH DAILY 07/21/21   Haydee Salter, MD  Black Cohosh 20 MG TABS Take 40 mg by mouth daily.     [provider]  lisinopril (ZESTRIL) 10 MG tablet TAKE 1 TABLET(10 MG) BY MOUTH DAILY 07/27/21   Haydee Salter, MD  Multiple Vitamins-Minerals (HAIR/SKIN/NAILS/BIOTIN PO) Take 1 tablet by mouth daily.    [provider]  nitroGLYCERIN (NITROSTAT) 0.4 MG SL tablet Place 1 tablet (0.4 mg total) under the tongue every 5 (five) minutes as needed for up to 25 days for chest pain. Patient taking differently: Place 0.4 mg under  the tongue as needed for chest pain. 02/14/21 10/29/21  Adrian Prows, MD  promethazine-dextromethorphan (PROMETHAZINE-DM) 6.25-15 MG/5ML syrup Take 5 mLs by mouth 4 (four) times daily as needed for cough. 11/02/21   Melynda Ripple, MD  Spacer/Aero-Holding Chambers (AEROCHAMBER MV) inhaler Use as instructed 11/02/21   Melynda Ripple, MD    Family History Family History  Problem Relation Age of Onset   Sarcoidosis Mother    Hypertension Mother    Hyperlipidemia Mother    Cancer Father        Prostate   Fibroids Sister    Thyroid disease Maternal Aunt    Heart disease Paternal Uncle    Hypertension Maternal Grandmother    Hypertension Paternal Grandmother    Diabetes Paternal Grandmother    Mental illness Cousin    Colon cancer Neg Hx    Colon polyps Neg Hx    Esophageal cancer Neg Hx    Rectal cancer Neg Hx    Stomach cancer Neg Hx    Sleep apnea Neg Hx     Social History Social History   Tobacco Use   Smoking status: Former    Packs/day: 1.00    Years: 20.00    Total pack years: 20.00    Types: Cigarettes    Quit date: 06/13/2011    Years since quitting: 10.4   Smokeless tobacco: Never  Vaping Use   Vaping Use: Never used  Substance Use Topics   Alcohol use: Not Currently   Drug use: No     Allergies   Keflex [cephalexin] and Penicillins   Review of Systems Review of Systems  Constitutional:  Positive for  activity change. Negative for appetite change, fatigue and fever.  HENT:  Positive for dental problem. Negative for congestion, sore throat, trouble swallowing and voice change.   Gastrointestinal:  Negative for abdominal pain, diarrhea, nausea and vomiting.  Neurological:  Negative for dizziness, light-headedness and headaches.     Physical Exam Triage Vital Signs ED Triage Vitals [12/08/21 1359]  Enc Vitals Group     BP (!) 152/91     Pulse Rate 87     Resp 16     Temp 98.1 F (36.7 C)     Temp Source Oral     SpO2 99 %     Weight      Height      Head Circumference      Peak Flow      Pain Score      Pain Loc      Pain Edu?      Excl. in [place]?    No data found.  Updated Vital Signs BP (!) 152/91 (BP Location: Left Arm)   Pulse 87   Temp 98.1 F (36.7 C) (Oral)   Resp 16   SpO2 99%   Visual Acuity Right Eye Distance:   Left Eye Distance:   Bilateral Distance:    Right Eye Near:   Left Eye Near:    Bilateral Near:     Physical Exam Vitals reviewed.  Constitutional:      General: She is awake. She is not in acute distress.    Appearance: Normal appearance. She is well-developed. She is not ill-appearing.     Comments: Very pleasant female appears stated age in no acute distress sitting comfortably in exam room  HENT:     Head: Normocephalic and atraumatic.     Right Ear: External ear normal.     Left Ear: External ear normal.  Nose:     Right Sinus: Maxillary sinus tenderness present. No frontal sinus tenderness.     Left Sinus: No maxillary sinus tenderness or frontal sinus tenderness.     Mouth/Throat:     Dentition: Abnormal dentition. Gingival swelling and dental abscesses present.     Pharynx: Uvula midline. No oropharyngeal exudate or posterior oropharyngeal erythema.      Comments: No evidence of Ludwig angina Cardiovascular:     Rate and Rhythm: Normal rate and regular rhythm.     Heart sounds: Normal heart sounds, S1 normal and S2 normal. No  murmur heard. Pulmonary:     Effort: Pulmonary effort is normal.     Breath sounds: Normal breath sounds. No wheezing, rhonchi or rales.     Comments: Clear to auscultation bilaterally Psychiatric:        Behavior: Behavior is cooperative.      UC Treatments / Results  Labs (all labs ordered are listed, but only abnormal results are displayed) Labs Reviewed - No data to display  EKG   Radiology No results found.  Procedures Procedures (including critical care time)  Medications Ordered in UC Medications - No data to display  Initial Impression / Assessment and Plan / UC Course  I have reviewed the triage vital signs and the nursing notes.  Pertinent labs & imaging results that were available during my care of the patient were reviewed by me and considered in my medical decision making (see chart for details).     Patient is well-appearing, afebrile, nontoxic, nontachycardic.  Concern for dental infection given clinical presentation.  Patient has penicillin allergy so was started on clindamycin 300 mg 3 times daily.  She was given ibuprofen 800 mg to help manage pain with instruction not to take additional NSAIDs with this medication.  Given severity of pain despite over-the-counter medication several doses of hydrocodone were sent to pharmacy.  Review of [place] controlled substance database shows no inappropriate refills; was last given tramadol in March 2023.  We discussed that this is sedating as well as potentially addictive and she should limit use of this medication as much as possible.  She is not to drive or drink alcohol while taking this medication.  Discussed that ultimately she will need to see a dentist to have underlying tooth addressed or she will likely have recurrent symptoms.  Discussed that if anything changes and she develops swelling of her throat, shortness of breath, fever, nausea/vomiting, muffled voice that she needs to go to the emergency room.   Strict return precautions given.  Final Clinical Impressions(s) / UC Diagnoses   Final diagnoses:  Dental infection  Dentalgia     Discharge Instructions      Start clindamycin for infection.  Use ibuprofen 800 mg for pain.  Do not take NSAIDs including aspirin, ibuprofen/Advil, naproxen/Aleve with this medication due to risk of GI bleeding.  You can use Tylenol/acetaminophen for symptom relief.  I have called in a few doses of hydrocodone.  This is sedating and can be addictive.  Please limit use of this medication is much as possible.  Do not drive or drink alcohol with taking it.  You will ultimately need to see a dentist.  Please call to schedule an appointment soon as possible.  If anything changes and you have fever, nausea/vomiting, swelling of your throat, shortness of breath, trouble swallowing, muffled voice you need to go to the emergency room.     ED Prescriptions     Medication  Sig Dispense Auth. Provider   clindamycin (CLEOCIN) 300 MG capsule Take 1 capsule (300 mg total) by mouth 3 (three) times daily. 30 capsule Azizah Lisle K, PA-C   ibuprofen (ADVIL) 800 MG tablet Take 1 tablet (800 mg total) by mouth 3 (three) times daily. 21 tablet Raynaldo Falco K, PA-C   HYDROcodone-acetaminophen (NORCO/VICODIN) 5-325 MG tablet Take 2 tablets by mouth 2 (two) times daily as needed for up to 2 days. 4 tablet Emylee Decelle K, PA-C      I have reviewed the PDMP during this encounter.   Terrilee Croak, PA-C 12/08/21 1435

## 2021-12-17 ENCOUNTER — Ambulatory Visit (AMBULATORY_SURGERY_CENTER): Payer: Self-pay

## 2021-12-17 VITALS — Ht 64.0 in | Wt 226.0 lb

## 2021-12-17 DIAGNOSIS — Z86010 Personal history of colonic polyps: Secondary | ICD-10-CM

## 2021-12-17 NOTE — Progress Notes (Signed)
No egg or soy allergy known to patient  No issues known to pt with past sedation with any surgeries or procedures Patient denies ever being told they had issues or difficulty with intubation  No FH of Malignant Hyperthermia Pt is not on diet pills Pt is not on home 02  Pt is not on blood thinners  Pt denies issues with constipation  No A fib or A flutter Have any cardiac testing pending--NO Pt instructed to use Singlecare.com or GoodRx for a price reduction on prep   Insurance verified during [place] appt= [employer]  Patient's chart reviewed by Osvaldo Angst CNRA prior to previsit and patient appropriate for the [facility].  Previsit completed and red dot placed by patient's name on their procedure day (on provider's schedule).

## 2022-01-07 ENCOUNTER — Encounter: Payer: Self-pay | Admitting: Gastroenterology

## 2022-01-07 ENCOUNTER — Encounter: Payer: 59 | Admitting: Gastroenterology

## 2022-01-12 ENCOUNTER — Encounter: Payer: Self-pay | Admitting: Certified Registered Nurse Anesthetist

## 2022-01-15 ENCOUNTER — Ambulatory Visit (AMBULATORY_SURGERY_CENTER): Payer: 59 | Admitting: Gastroenterology

## 2022-01-15 ENCOUNTER — Encounter: Payer: Self-pay | Admitting: Gastroenterology

## 2022-01-15 VITALS — BP 111/72 | HR 85 | Temp 97.3°F | Resp 16 | Ht 64.0 in | Wt 226.0 lb

## 2022-01-15 DIAGNOSIS — D123 Benign neoplasm of transverse colon: Secondary | ICD-10-CM | POA: Diagnosis not present

## 2022-01-15 DIAGNOSIS — D129 Benign neoplasm of anus and anal canal: Secondary | ICD-10-CM | POA: Diagnosis not present

## 2022-01-15 DIAGNOSIS — D128 Benign neoplasm of rectum: Secondary | ICD-10-CM | POA: Diagnosis not present

## 2022-01-15 DIAGNOSIS — Z09 Encounter for follow-up examination after completed treatment for conditions other than malignant neoplasm: Secondary | ICD-10-CM

## 2022-01-15 DIAGNOSIS — K644 Residual hemorrhoidal skin tags: Secondary | ICD-10-CM

## 2022-01-15 DIAGNOSIS — D120 Benign neoplasm of cecum: Secondary | ICD-10-CM

## 2022-01-15 DIAGNOSIS — D127 Benign neoplasm of rectosigmoid junction: Secondary | ICD-10-CM | POA: Diagnosis not present

## 2022-01-15 DIAGNOSIS — Z86010 Personal history of colonic polyps: Secondary | ICD-10-CM

## 2022-01-15 DIAGNOSIS — D122 Benign neoplasm of ascending colon: Secondary | ICD-10-CM

## 2022-01-15 DIAGNOSIS — D124 Benign neoplasm of descending colon: Secondary | ICD-10-CM | POA: Diagnosis not present

## 2022-01-15 MED ORDER — SODIUM CHLORIDE 0.9 % IV SOLN: 500.0000 mL | Freq: Once | INTRAVENOUS | Status: DC

## 2022-01-15 NOTE — Progress Notes (Signed)
GASTROENTEROLOGY PROCEDURE H&P NOTE   Primary Care Physician: Haydee Salter, MD  HPI: [name] is a 51 y.o. female who presents for Colonoscopy for history of previous polyps (SSPs in 2020).  Past Medical History:  Diagnosis Date   Abnormal Pap smear 2000   [facility] Biopsy   Acute respiratory failure with hypoxia ([place]) 10/19/2019   Allergy    Arthritis    LEFT knee   Back pain    Blood transfusion without reported diagnosis 2005   Breast fibroadenoma    Diverticulitis of colon with perforation 05/20/2014   GERD (gastroesophageal reflux disease)    H/O metrorrhagia 12/2006   H/O myomectomy 07/2006   robotic   H/O sinusitis    Headache(784.0)    Heart murmur    dx age 56/19- no issues currently   History of conization of cervix 2000   History of ectopic pregnancy 2005   r salpyngectomy   History of hysterectomy, supracervical 08/2008   robotic   History of syphilis    Hx of adenomatous polyp of colon 12/31/2017   Hx of herpes simplex type 2 infection    Hyperlipidemia    on meds   Hypertension    on meds   Ileostomy present ([place]) 10/26/2014   reversed in 10/2014   Intra-abdominal abscess ([place]) 07/21/2014   PMS (premenstrual syndrome)    Seasonal allergies    Sleep apnea    does not use CPAP   Past Surgical History:  Procedure Laterality Date   BREAST LUMPECTOMY  11/2009   rt-neg   BREAST SURGERY  2004   fibroademoma   CERVICAL CONE BIOPSY  2000   COLON RESECTION N/A 07/06/2014   Procedure: LAPAROSCOPIC LOOP ILEOSTOMY WITH PLACEMENT OF PELVIC DRAIN;  Surgeon: Donnie Mesa, MD;  Location: [facility];  Service: General;  Laterality: N/A;   COLON SURGERY  10/2014   reversal of ileostomy   COLONOSCOPY  12/2018   GM-MAC-suprep(good)-SSPx 5, end to end colonic anastomosis sigmoidcolon   DILATION AND CURETTAGE OF UTERUS  2009   DIVERTING ILEOSTOMY Right 06/2014   ECTOPIC PREGNANCY SURGERY  2005   R salpyngectomy   ILEOSTOMY N/A 10/26/2014   Procedure:  LOOP ILEOSTOMY REVERSAL;  Surgeon: Donnie Mesa, MD;  Location: [facility];  Service: General;  Laterality: N/A;   LAPAROSCOPIC SIGMOID COLECTOMY N/A 06/30/2014   Procedure: LAPAROSCOPIC ASSISTED SIGMOID COLECTOMY;  Surgeon: Donnie Mesa, MD;  Location: [facility];  Service: General;  Laterality: N/A;   MYOMECTOMY  2008   robotic   POLYPECTOMY  2009   POLYPECTOMY  2020   SSP x 5   ROBOTIC ASSISTED LAP VAGINAL HYSTERECTOMY  2010   supracervical   SALPINGECTOMY Right 2005   ectopic pregnancy-RIGHT tube removed   TRIGGER FINGER RELEASE Right 09/28/2012   Procedure: RELEASE TRIGGER FINGER/A-1 PULLEY RIGHT THUMB;  Surgeon: Jolyn Nap, MD;  Location: [facility];  Service: Orthopedics;  Laterality: Right;   ULNAR NERVE TRANSPOSITION Right 12/01/2018   Procedure: ULNAR NERVE DECOMPRESSION;  Surgeon: Daryll Brod, MD;  Location: [facility];  Service: Orthopedics;  Laterality: Right;  AXILLARY   WISDOM TOOTH EXTRACTION  2022   Current Outpatient Medications  Medication Sig Dispense Refill   amLODipine (NORVASC) 5 MG tablet TAKE 1 TABLET(5 MG) BY MOUTH DAILY 90 tablet 3   atorvastatin (LIPITOR) 20 MG tablet TAKE 1 TABLET(20 MG) BY MOUTH DAILY 90 tablet 3   Black Cohosh 20 MG TABS Take 40 mg by mouth  daily.      lisinopril (ZESTRIL) 10 MG tablet TAKE 1 TABLET(10 MG) BY MOUTH DAILY 90 tablet 3   Multiple Vitamins-Minerals (HAIR/SKIN/NAILS/BIOTIN PO) Take 3 tablets by mouth daily.     acetaminophen-codeine (TYLENOL #3) 300-30 MG tablet Take 1 tablet by mouth every 6 (six) hours as needed. (Patient not taking: Reported on 01/15/2022)     albuterol (VENTOLIN HFA) 108 (90 Base) MCG/ACT inhaler Inhale 1-2 puffs into the lungs every 6 (six) hours as needed for wheezing or shortness of breath. 8 g 0   ibuprofen (ADVIL) 800 MG tablet Take 1 tablet (800 mg total) by mouth 3 (three) times daily. (Patient taking differently: Take 800 mg by mouth 3 (three) times daily as needed.) 21  tablet 0   nitroGLYCERIN (NITROSTAT) 0.4 MG SL tablet Place 1 tablet (0.4 mg total) under the tongue every 5 (five) minutes as needed for up to 25 days for chest pain. (Patient not taking: Reported on 12/17/2021) 25 tablet 1   oxyCODONE (OXY IR/ROXICODONE) 5 MG immediate release tablet Take 5 mg by mouth every 4 (four) hours as needed. (Patient not taking: Reported on 01/15/2022)     promethazine-dextromethorphan (PROMETHAZINE-DM) 6.25-15 MG/5ML syrup Take 5 mLs by mouth 4 (four) times daily as needed for cough. 118 mL 0   Spacer/Aero-Holding Chambers (AEROCHAMBER MV) inhaler Use as instructed 1 each 1   Current Facility-Administered Medications  Medication Dose Route Frequency Provider Last Rate Last Admin   0.9 %  sodium chloride infusion  500 mL Intravenous Once Mansouraty, Telford Nab., MD        Current Outpatient Medications:    amLODipine (NORVASC) 5 MG tablet, TAKE 1 TABLET(5 MG) BY MOUTH DAILY, Disp: 90 tablet, Rfl: 3   atorvastatin (LIPITOR) 20 MG tablet, TAKE 1 TABLET(20 MG) BY MOUTH DAILY, Disp: 90 tablet, Rfl: 3   Black Cohosh 20 MG TABS, Take 40 mg by mouth daily. , Disp: , Rfl:    lisinopril (ZESTRIL) 10 MG tablet, TAKE 1 TABLET(10 MG) BY MOUTH DAILY, Disp: 90 tablet, Rfl: 3   Multiple Vitamins-Minerals (HAIR/SKIN/NAILS/BIOTIN PO), Take 3 tablets by mouth daily., Disp: , Rfl:    acetaminophen-codeine (TYLENOL #3) 300-30 MG tablet, Take 1 tablet by mouth every 6 (six) hours as needed. (Patient not taking: Reported on 01/15/2022), Disp: , Rfl:    albuterol (VENTOLIN HFA) 108 (90 Base) MCG/ACT inhaler, Inhale 1-2 puffs into the lungs every 6 (six) hours as needed for wheezing or shortness of breath., Disp: 8 g, Rfl: 0   ibuprofen (ADVIL) 800 MG tablet, Take 1 tablet (800 mg total) by mouth 3 (three) times daily. (Patient taking differently: Take 800 mg by mouth 3 (three) times daily as needed.), Disp: 21 tablet, Rfl: 0   nitroGLYCERIN (NITROSTAT) 0.4 MG SL tablet, Place 1 tablet (0.4  mg total) under the tongue every 5 (five) minutes as needed for up to 25 days for chest pain. (Patient not taking: Reported on 12/17/2021), Disp: 25 tablet, Rfl: 1   oxyCODONE (OXY IR/ROXICODONE) 5 MG immediate release tablet, Take 5 mg by mouth every 4 (four) hours as needed. (Patient not taking: Reported on 01/15/2022), Disp: , Rfl:    promethazine-dextromethorphan (PROMETHAZINE-DM) 6.25-15 MG/5ML syrup, Take 5 mLs by mouth 4 (four) times daily as needed for cough., Disp: 118 mL, Rfl: 0   Spacer/Aero-Holding Chambers (AEROCHAMBER MV) inhaler, Use as instructed, Disp: 1 each, Rfl: 1  Current Facility-Administered Medications:    0.9 %  sodium chloride infusion, 500 mL, Intravenous, Once,  Mansouraty, Telford Nab., MD Allergies  Allergen Reactions   Keflex [Cephalexin] Anaphylaxis   Penicillins Anaphylaxis    Has patient had a PCN reaction causing immediate rash, facial/tongue/throat swelling, SOB or lightheadedness with hypotension: Yes Has patient had a PCN reaction causing severe rash involving mucus membranes or skin necrosis: No Has patient had a PCN reaction that required hospitalization: No Has patient had a PCN reaction occurring within the last 10 years: No If all of the above answers are "NO", then may proceed with Cephalosporin use.    Prednisone Other (See Comments)    "It makes me feel like I am out of my body"   Family History  Problem Relation Age of Onset   Sarcoidosis Mother    Hypertension Mother    Hyperlipidemia Mother    Prostate cancer Father 81       radiation seed   Fibroids Sister    Thyroid disease Maternal Aunt    Heart disease Paternal Uncle    Hypertension Maternal Grandmother    Hypertension Paternal Grandmother    Diabetes Paternal Grandmother    Mental illness Cousin    Colon cancer Neg Hx    Colon polyps Neg Hx    Esophageal cancer Neg Hx    Rectal cancer Neg Hx    Stomach cancer Neg Hx    Sleep apnea Neg Hx    Social History   Socioeconomic  History   Marital status: Married    Spouse name: [name]   Number of children: 2   Years of education: Not on file   Highest education level: Associate degree: occupational, Hotel manager, or vocational program  Occupational History   Occupation: CMA    Comment: [employer] Use   Smoking status: Former    Packs/day: 1.00    Years: 20.00    Total pack years: 20.00    Types: Cigarettes    Quit date: 06/13/2011    Years since quitting: 10.6   Smokeless tobacco: Never  Vaping Use   Vaping Use: Never used  Substance and Sexual Activity   Alcohol use: Not Currently   Drug use: No   Sexual activity: Yes  Other Topics Concern   Not on file  Social History Narrative   Lives with her husband and their son.   Social Determinants of Health   Financial Resource Strain: Not on file  Food Insecurity: No Food Insecurity (05/01/2021)   Hunger Vital Sign    Worried About Running Out of Food in the Last Year: Never true    Ran Out of Food in the Last Year: Never true  Transportation Needs: Not on file  Physical Activity: Not on file  Stress: Not on file  Social Connections: Not on file  Intimate Partner Violence: Not on file    Physical Exam: Today's Vitals   01/15/22 0830  BP: 117/61  Pulse: 99  Temp: (!) 97.3 F (36.3 C)  SpO2: 98%  Weight: 226 lb (102.5 kg)  Height: _0  (1.626 m)   Body mass index is 38.79 kg/m. GEN: NAD EYE: Sclerae anicteric ENT: MMM CV: Non-tachycardic GI: Soft, NT/ND NEURO:  Alert & Oriented x 3  Lab Results: No results for input(s): "WBC", "HGB", "HCT", "PLT" in the last 72 hours. BMET No results for input(s): "NA", "K", "CL", "CO2", "GLUCOSE", "BUN", "CREATININE", "CALCIUM" in the last 72 hours. LFT No results for input(s): "PROT", "ALBUMIN", "AST", "ALT", "ALKPHOS", "BILITOT", "BILIDIR", "IBILI" in the last 72 hours. PT/INR No  results for input(s): "LABPROT", "INR" in the last 72 hours.   Impression / Plan: This is a 51  y.o.female who presents for   The risks and benefits of endoscopic evaluation/treatment were discussed with the patient and/or family; these include but are not limited to the risk of perforation, infection, bleeding, missed lesions, lack of diagnosis, severe illness requiring hospitalization, as well as anesthesia and sedation related illnesses.  The patient's history has been reviewed, patient examined, no change in status, and deemed stable for procedure.  The patient and/or family is agreeable to proceed.    Justice Britain, MD [facility] Advanced [facility]oscopy Office # [phone]

## 2022-01-15 NOTE — Progress Notes (Signed)
Report given to PACU, vss 

## 2022-01-15 NOTE — Patient Instructions (Addendum)
Handouts Provided:  Polyps, diverticulosis and High Fiber Diet  - The patient will be observed post-procedure, until all discharge criteria are met. - Discharge patient to home. - Patient has a contact number available for emergencies. The signs and symptoms of potential delayed complications were discussed with the patient. Return to normal activities tomorrow. Written discharge instructions were provided to the patient. - High fiber diet. - Use FiberCon 1-2 tablets PO daily. - Continue present medications. - Await pathology results. - Repeat colonoscopy in 3/5/7 years for surveillance based on pathology results. - The findings and recommendations were discussed with the patient. - The findings and recommendations were discussed with the patient's family.  YOU HAD AN ENDOSCOPIC PROCEDURE TODAY AT [facility]:   Refer to the procedure report that was given to you for any specific questions about what was found during the examination.  If the procedure report does not answer your questions, please call your gastroenterologist to clarify.  If you requested that your care partner not be given the details of your procedure findings, then the procedure report has been included in a sealed envelope for you to review at your convenience later.  YOU SHOULD EXPECT: Some feelings of bloating in the abdomen. Passage of more gas than usual.  Walking can help get rid of the air that was put into your GI tract during the procedure and reduce the bloating. If you had a lower endoscopy (such as a colonoscopy or flexible sigmoidoscopy) you may notice spotting of blood in your stool or on the toilet paper. If you underwent a bowel prep for your procedure, you may not have a normal bowel movement for a few days.  Please Note:  You might notice some irritation and congestion in your nose or some drainage.  This is from the oxygen used during your procedure.  There is no need for concern and it should  clear up in a day or so.  SYMPTOMS TO REPORT IMMEDIATELY:  Following lower endoscopy (colonoscopy or flexible sigmoidoscopy):  Excessive amounts of blood in the stool  Significant tenderness or worsening of abdominal pains  Swelling of the abdomen that is new, acute  Fever of 100F or higher  For urgent or emergent issues, a gastroenterologist can be reached at any hour by calling [phone]. Do not use MyChart messaging for urgent concerns.    DIET:  We do recommend a small meal at first, but then you may proceed to your regular diet.  Drink plenty of fluids but you should avoid alcoholic beverages for 24 hours.  ACTIVITY:  You should plan to take it easy for the rest of today and you should NOT DRIVE or use heavy machinery until tomorrow (because of the sedation medicines used during the test).    FOLLOW UP: Our staff will call the number listed on your records the next business day following your procedure.  We will call around 7:15- 8:00 am to check on you and address any questions or concerns that you may have regarding the information given to you following your procedure. If we do not reach you, we will leave a message.     If any biopsies were taken you will be contacted by phone or by letter within the next 1-3 weeks.  Please call us at [phone] if you have not heard about the biopsies in 3 weeks.    SIGNATURES/CONFIDENTIALITY: You and/or your care partner have signed paperwork which will be entered into your electronic  medical record.  These signatures attest to the fact that that the information above on your After Visit Summary has been reviewed and is understood.  Full responsibility of the confidentiality of this discharge information lies with you and/or your care-partner.

## 2022-01-15 NOTE — Progress Notes (Signed)
Called to room to assist during endoscopic procedure.  Patient ID and intended procedure confirmed with present staff. Received instructions for my participation in the procedure from the performing physician.  

## 2022-01-15 NOTE — Op Note (Signed)
[facility] Patient Name: [name] Procedure Date: 01/15/2022 9:14 AM MRN: [id number] Endoscopist: Justice Britain , MD, [id number] Age: 51 Referring MD:  Date of Birth: 1970/03/23 Gender: Female Account #: [id number] Procedure:                Colonoscopy Indications:              High risk colon cancer surveillance: Personal                            history of sessile serrated colon polyp (less than                            10 mm in size) with no dysplasia Medicines:                Monitored Anesthesia Care Procedure:                Pre-Anesthesia Assessment:                           - Prior to the procedure, a History and Physical                            was performed, and patient medications and                            allergies were reviewed. The patient's tolerance of                            previous anesthesia was also reviewed. The risks                            and benefits of the procedure and the sedation                            options and risks were discussed with the patient.                            All questions were answered, and informed consent                            was obtained. Prior Anticoagulants: The patient has                            taken no anticoagulant or antiplatelet agents                            except for NSAID medication. ASA Grade Assessment:                            II - A patient with mild systemic disease. After                            reviewing the risks and benefits, the patient was  deemed in satisfactory condition to undergo the                            procedure.                           After obtaining informed consent, the colonoscope                            was passed under direct vision. Throughout the                            procedure, the patient's blood pressure, pulse, and                            oxygen saturations were monitored  continuously. The                            CF [id number] #[id number] was introduced through the anus                            and advanced to the 5 cm into the ileum. The                            colonoscopy was performed without difficulty. The                            patient tolerated the procedure. The quality of the                            bowel preparation was good. The terminal ileum,                            ileocecal valve, appendiceal orifice, and rectum                            were photographed. Scope In: 9:29:03 AM Scope Out: 9:43:52 AM Scope Withdrawal Time: 0 hours 12 minutes 35 seconds  Total Procedure Duration: 0 hours 14 minutes 49 seconds  Findings:                 Skin tags were found on perianal exam.                           The digital rectal exam findings include                            hemorrhoids. Pertinent negatives include no                            palpable rectal lesions.                           The terminal ileum and ileocecal valve appeared                              normal.                           Six sessile polyps were found in the rectum (1),                            recto-sigmoid colon (1), descending colon (1),                            transverse colon (1), ascending colon (1) and cecum                            (1). The polyps were 2 to 6 mm in size. These                            polyps were removed with a cold snare. Resection                            and retrieval were complete.                           Multiple small-mouthed diverticula were found in                            the recto-sigmoid colon and sigmoid colon.                           There was evidence of a prior functional end-to-end                            colo-colonic anastomosis in the recto-sigmoid                            colon. This was patent and was characterized by                            healthy appearing mucosa. The anastomosis was                             traversed.                           Non-bleeding non-thrombosed internal hemorrhoids                            were found during retroflexion, during perianal                            exam and during digital exam. The hemorrhoids were                            Grade II (internal hemorrhoids that prolapse but                            reduce spontaneously). Complications:  No immediate complications. Estimated Blood Loss:     Estimated blood loss was minimal. Estimated blood                            loss was minimal. Impression:               - Perianal skin tags found on perianal exam.                           - Hemorrhoids found on digital rectal exam.                           - The examined portion of the ileum was normal.                           - Six, 2 to 6 mm polyps in the rectum, at the                            recto-sigmoid colon, in the descending colon, in                            the transverse colon, in the ascending colon and in                            the cecum, removed with a cold snare. Resected and                            retrieved.                           - Diverticulosis in the recto-sigmoid colon and in                            the sigmoid colon.                           - Patent functional end-to-end colo-colonic                            anastomosis, characterized by healthy appearing                            mucosa.                           - Non-bleeding non-thrombosed internal hemorrhoids. Recommendation:           - The patient will be observed post-procedure,                            until all discharge criteria are met.                           - Discharge patient to home.                           -   Patient has a contact number available for                            emergencies. The signs and symptoms of potential                            delayed complications were discussed with the                             patient. Return to normal activities tomorrow.                            Written discharge instructions were provided to the                            patient.                           - High fiber diet.                           - Use FiberCon 1-2 tablets PO daily.                           - Continue present medications.                           - Await pathology results.                           - Repeat colonoscopy in 3/5/7 years for                            surveillance based on pathology results.                           - The findings and recommendations were discussed                            with the patient.                           - The findings and recommendations were discussed                            with the patient's family. Gabriel Mansouraty, MD 01/15/2022 9:50:47 AM 

## 2022-01-15 NOTE — Progress Notes (Signed)
Pt's states no medical or surgical changes since previsit or office visit.  Pt vomited 1/2 prep this morning after vomiting prep, she coughed up bright red blood 3 times equalling about 1 tablespoon of blood.  CRNA made aware

## 2022-01-16 ENCOUNTER — Telehealth: Payer: Self-pay

## 2022-01-16 NOTE — Telephone Encounter (Signed)
  Follow up Call-     01/15/2022    8:30 AM  Call back number  Post procedure Call Back phone  # [phone]  Permission to leave phone message Yes     Patient questions:  Do you have a fever, pain , or abdominal swelling? No. Pain Score  0 *  Have you tolerated food without any problems? Yes.    Have you been able to return to your normal activities? Yes.    Do you have any questions about your discharge instructions: Diet   No. Medications  No. Follow up visit  No.  Do you have questions or concerns about your Care? No.  Actions: * If pain score is 4 or above: No action needed, pain <4.

## 2022-01-24 ENCOUNTER — Encounter: Payer: Self-pay | Admitting: Gastroenterology

## 2022-01-28 ENCOUNTER — Ambulatory Visit: Payer: 59 | Admitting: Family Medicine

## 2022-02-22 NOTE — Progress Notes (Unsigned)
   I, Peterson Lombard, LAT, ATC acting as a scribe for Lynne Leader, MD.  [name] is a 52 y.o. female who presents to [facility] today for cont'd left hand pain.  Pt notes a family hx of OA and rheumatoid arthritis. Patient was last seen by Dr. Georgina Snell on 05/11/2021 and pain was along the left fourth PIP joint.  Patient was advised to work on HEP and use Voltaren gel.  Today, patient reports  Dx imaging: 05/11/2021 L hand x-ray  Pertinent review of systems: ***  Relevant historical information: ***   Exam:  There were no vitals taken for this visit. General: Well Developed, well nourished, and in no acute distress.   MSK: ***    Lab and Radiology Results No results found for this or any previous visit (from the past 72 hour(s)). No results found.     Assessment and Plan: 52 y.o. female with ***   PDMP not reviewed this encounter. No orders of the defined types were placed in this encounter.  No orders of the defined types were placed in this encounter.    Discussed warning signs or symptoms. Please see discharge instructions. Patient expresses understanding.   ***

## 2022-02-25 ENCOUNTER — Ambulatory Visit: Payer: Self-pay

## 2022-02-25 ENCOUNTER — Ambulatory Visit: Payer: 59 | Admitting: Family Medicine

## 2022-02-25 ENCOUNTER — Ambulatory Visit (INDEPENDENT_AMBULATORY_CARE_PROVIDER_SITE_OTHER): Payer: 59

## 2022-02-25 VITALS — BP 124/78 | HR 105 | Ht 64.0 in | Wt 229.0 lb

## 2022-02-25 DIAGNOSIS — M67442 Ganglion, left hand: Secondary | ICD-10-CM

## 2022-02-25 DIAGNOSIS — M79642 Pain in left hand: Secondary | ICD-10-CM

## 2022-02-25 DIAGNOSIS — R202 Paresthesia of skin: Secondary | ICD-10-CM

## 2022-02-25 DIAGNOSIS — M5412 Radiculopathy, cervical region: Secondary | ICD-10-CM

## 2022-02-25 MED ORDER — GABAPENTIN 300 MG PO CAPS: 300.0000 mg | ORAL_CAPSULE | Freq: Every day | ORAL | 2 refills | Status: DC

## 2022-02-25 NOTE — Patient Instructions (Addendum)
Thank you for coming in today.   You received an injection today. Seek immediate medical attention if the joint becomes red, extremely painful, or is oozing fluid.   Try gabapentin mostly at bedtime.   Let me know if your hand symptoms are bad enough for prednisone.   Get xray of your neck today on the way out.

## 2022-02-26 NOTE — Progress Notes (Signed)
Cspine xray shows a little arthritis changes.

## 2022-03-13 ENCOUNTER — Other Ambulatory Visit: Payer: Self-pay

## 2022-03-13 DIAGNOSIS — E782 Mixed hyperlipidemia: Secondary | ICD-10-CM

## 2022-03-13 DIAGNOSIS — I10 Essential (primary) hypertension: Secondary | ICD-10-CM

## 2022-03-13 MED ORDER — AMLODIPINE BESYLATE 5 MG PO TABS: ORAL_TABLET | ORAL | 1 refills | Status: DC

## 2022-03-13 MED ORDER — LISINOPRIL 10 MG PO TABS: ORAL_TABLET | ORAL | 1 refills | Status: DC

## 2022-03-13 MED ORDER — ATORVASTATIN CALCIUM 20 MG PO TABS: ORAL_TABLET | ORAL | 1 refills | Status: DC

## 2022-03-13 NOTE — Telephone Encounter (Signed)
Spoke tp patient regarding receiving a new prescription request for [facility] for Amlodipine, Atorvastatin, and Lisinopril.  She does want this and Rx's sent to [facility] with Qty #90, 1 Rf. Dm/cma

## 2022-04-01 ENCOUNTER — Ambulatory Visit: Payer: 59 | Admitting: Family Medicine

## 2022-04-01 ENCOUNTER — Encounter: Payer: Self-pay | Admitting: Family Medicine

## 2022-04-01 VITALS — BP 126/80 | HR 87 | Temp 97.6°F | Ht 64.0 in | Wt 233.6 lb

## 2022-04-01 DIAGNOSIS — E782 Mixed hyperlipidemia: Secondary | ICD-10-CM

## 2022-04-01 DIAGNOSIS — I10 Essential (primary) hypertension: Secondary | ICD-10-CM

## 2022-04-01 DIAGNOSIS — R7303 Prediabetes: Secondary | ICD-10-CM | POA: Diagnosis not present

## 2022-04-01 DIAGNOSIS — M67442 Ganglion, left hand: Secondary | ICD-10-CM

## 2022-04-01 DIAGNOSIS — G4733 Obstructive sleep apnea (adult) (pediatric): Secondary | ICD-10-CM

## 2022-04-01 DIAGNOSIS — G5622 Lesion of ulnar nerve, left upper limb: Secondary | ICD-10-CM

## 2022-04-01 DIAGNOSIS — D259 Leiomyoma of uterus, unspecified: Secondary | ICD-10-CM | POA: Insufficient documentation

## 2022-04-01 LAB — LIPID PANEL
Cholesterol: 183 mg/dL (ref 0–200)
HDL: 64.7 mg/dL (ref >39.00)
LDL Cholesterol: 104 mg/dL — ABNORMAL HIGH (ref 0–99)
NonHDL: 118.57
Total CHOL/HDL Ratio: 3
Triglycerides: 72 mg/dL (ref 0.0–149.0)
VLDL: 14.4 mg/dL (ref 0.0–40.0)

## 2022-04-01 LAB — BASIC METABOLIC PANEL
BUN: 10 mg/dL (ref 6–23)
CO2: 28 mEq/L (ref 19–32)
Calcium: 10.2 mg/dL (ref 8.4–10.5)
Chloride: 103 mEq/L (ref 96–112)
Creatinine, Ser: 0.81 mg/dL (ref 0.40–1.20)
GFR: 83.1 mL/min (ref >60.00)
Glucose, Bld: 91 mg/dL (ref 70–99)
Potassium: 3.9 mEq/L (ref 3.5–5.1)
Sodium: 139 mEq/L (ref 135–145)

## 2022-04-01 LAB — HEMOGLOBIN A1C: Hgb A1c MFr Bld: 5.9 % (ref 4.6–6.5)

## 2022-04-01 MED ORDER — LOSARTAN POTASSIUM 50 MG PO TABS: 50.0000 mg | ORAL_TABLET | Freq: Every day | ORAL | 3 refills | Status: DC

## 2022-04-01 NOTE — Assessment & Plan Note (Signed)
We will check lipids today. Continue atorvastatin 20 mg daily.

## 2022-04-01 NOTE — Progress Notes (Signed)
[facility] [address] [place] [address] Dept: [phone] Dept Fax: [phone]  Chronic Care Office Visit  Subjective:    Patient ID: [name], female    DOB: 1970/03/15, 52 y.o..   MRN: [id number]  Chief Complaint  Patient presents with   Medical Management of Chronic Issues    3 month f/u, c/o having LT arm going to sleep.   Also having itching in her throat (? Lisinopril).  Not fasting today.    History of Present Illness:  Patient is in today for reassessment of chronic medical issues.  [name] has a history of hypertension, managed with amlodipine 5 mg daily and lisinopril 10 mg daily. She has been noting an issue with episodic tightness in her throat and feeling like she needs to cough. She was advised that this may be due to her use of lisinopril and told to speak with me about this.   [name] has hyperlipidemia, managed with atorvastatin 20 mg daily.    [name] has prediabetes.   [name] notes she has a nodule in her left 3rd finger. She was given a steroid injection by an orthopedist, but this has not resolved the issue.   [name] has a history of complaints of numbness in the left hand. She had a past history of right ulnar nerve entrapment and underwent a transposition surgery in 2020. I had referred her previously for evaluation with neurology concerning the left hand numbness. Apparently, her NCS was normal. Despite this, she has ongling issues with the numbness.  Past Medical History: Patient Active Problem List   Diagnosis Date Noted   Uterine leiomyoma 04/01/2022   Ganglion cyst of finger of left hand 02/25/2022   Hypertensive retinopathy of right eye 11/19/2021   Open angle with borderline findings and low glaucoma risk in both eyes 11/19/2021   Obstructive sleep apnea 06/10/2021   Degenerative joint disease (DJD) of lumbar spine 04/17/2021   Derangement of meniscus of left  knee 04/17/2021   Osteoarthritis of left hip 04/17/2021   Centrilobular emphysema ([place]) 06/29/2020   Aortic atherosclerosis ([place]) 06/29/2020   COVID-19 virus infection 10/19/2019   Mixed hyperlipidemia 10/19/2019   History of diverticulitis 11/05/2018   Prediabetes 10/08/2018   Class 2 obesity due to excess calories with body mass index (BMI) of 39.0 to 39.9 in adult 03/29/2018   Nonspecific abnormal electrocardiogram (ECG) (EKG) 03/27/2018   Atypical chest pain 03/27/2018   History of hysterectomy, supracervical 02/14/2017   Trigger ring finger of right hand 08/07/2015   Primary osteoarthritis of first carpometacarpal joint of right hand 08/07/2015   Diverticulitis of sigmoid colon s/p colectomy 06/30/2014 07/26/2014   Hx of adenomatous polyp of colon 12/23/2013   Essential hypertension 11/02/2013   Former smoker 11/26/2008   Past Surgical History:  Procedure Laterality Date   BREAST LUMPECTOMY  11/2009   rt-neg   BREAST SURGERY  2004   fibroademoma   CERVICAL CONE BIOPSY  2000   COLON RESECTION N/A 07/06/2014   Procedure: LAPAROSCOPIC LOOP ILEOSTOMY WITH PLACEMENT OF PELVIC DRAIN;  Surgeon: Donnie Mesa, MD;  Location: [facility];  Service: General;  Laterality: N/A;   COLON SURGERY  10/2014   reversal of ileostomy   COLONOSCOPY  12/2018   GM-MAC-suprep(good)-SSPx 5, end to end colonic anastomosis sigmoidcolon   DILATION AND CURETTAGE OF UTERUS  2009   DIVERTING ILEOSTOMY Right 06/2014   ECTOPIC PREGNANCY SURGERY  2005   R salpyngectomy  ILEOSTOMY N/A 10/26/2014   Procedure: LOOP ILEOSTOMY REVERSAL;  Surgeon: Donnie Mesa, MD;  Location: [facility];  Service: General;  Laterality: N/A;   LAPAROSCOPIC SIGMOID COLECTOMY N/A 06/30/2014   Procedure: LAPAROSCOPIC ASSISTED SIGMOID COLECTOMY;  Surgeon: Donnie Mesa, MD;  Location: [facility];  Service: General;  Laterality: N/A;   MYOMECTOMY  2008   robotic   POLYPECTOMY  2009   POLYPECTOMY  2020   SSP x 5   ROBOTIC ASSISTED LAP VAGINAL  HYSTERECTOMY  2010   supracervical   SALPINGECTOMY Right 2005   ectopic pregnancy-RIGHT tube removed   TRIGGER FINGER RELEASE Right 09/28/2012   Procedure: RELEASE TRIGGER FINGER/A-1 PULLEY RIGHT THUMB;  Surgeon: Jolyn Nap, MD;  Location: [facility];  Service: Orthopedics;  Laterality: Right;   ULNAR NERVE TRANSPOSITION Right 12/01/2018   Procedure: ULNAR NERVE DECOMPRESSION;  Surgeon: Daryll Brod, MD;  Location: [facility];  Service: Orthopedics;  Laterality: Right;  AXILLARY   WISDOM TOOTH EXTRACTION  2022   Family History  Problem Relation Age of Onset   Sarcoidosis Mother    Hypertension Mother    Hyperlipidemia Mother    Prostate cancer Father 53       radiation seed   Fibroids Sister    Thyroid disease Maternal Aunt    Heart disease Paternal Uncle    Hypertension Maternal Grandmother    Hypertension Paternal Grandmother    Diabetes Paternal Grandmother    Mental illness Cousin    Colon cancer Neg Hx    Colon polyps Neg Hx    Esophageal cancer Neg Hx    Rectal cancer Neg Hx    Stomach cancer Neg Hx    Sleep apnea Neg Hx    Outpatient Medications Prior to Visit  Medication Sig Dispense Refill   acetaminophen-codeine (TYLENOL #3) 300-30 MG tablet Take 1 tablet by mouth every 6 (six) hours as needed.     albuterol (VENTOLIN HFA) 108 (90 Base) MCG/ACT inhaler Inhale 1-2 puffs into the lungs every 6 (six) hours as needed for wheezing or shortness of breath. 8 g 0   amLODipine (NORVASC) 5 MG tablet TAKE 1 TABLET(5 MG) BY MOUTH DAILY 90 tablet 1   atorvastatin (LIPITOR) 20 MG tablet TAKE 1 TABLET(20 MG) BY MOUTH DAILY 90 tablet 1   Black Cohosh 20 MG TABS Take 40 mg by mouth daily.      lisinopril (ZESTRIL) 10 MG tablet TAKE 1 TABLET(10 MG) BY MOUTH DAILY 90 tablet 1   Multiple Vitamins-Minerals (HAIR/SKIN/NAILS/BIOTIN PO) Take 3 tablets by mouth daily.     nitroGLYCERIN (NITROSTAT) 0.4 MG SL tablet Place 1 tablet (0.4 mg total) under the  tongue every 5 (five) minutes as needed for up to 25 days for chest pain. 25 tablet 1   Spacer/Aero-Holding Chambers (AEROCHAMBER MV) inhaler Use as instructed 1 each 1   gabapentin (NEURONTIN) 300 MG capsule Take 1 capsule (300 mg total) by mouth at bedtime. 30 capsule 2   ibuprofen (ADVIL) 800 MG tablet Take 1 tablet (800 mg total) by mouth 3 (three) times daily. (Patient taking differently: Take 800 mg by mouth 3 (three) times daily as needed.) 21 tablet 0   promethazine-dextromethorphan (PROMETHAZINE-DM) 6.25-15 MG/5ML syrup Take 5 mLs by mouth 4 (four) times daily as needed for cough. 118 mL 0   No facility-administered medications prior to visit.   Allergies  Allergen Reactions   Keflex [Cephalexin] Anaphylaxis   Penicillins Anaphylaxis    Has patient had a PCN  reaction causing immediate rash, facial/tongue/throat swelling, SOB or lightheadedness with hypotension: Yes Has patient had a PCN reaction causing severe rash involving mucus membranes or skin necrosis: No Has patient had a PCN reaction that required hospitalization: No Has patient had a PCN reaction occurring within the last 10 years: No If all of the above answers are "NO", then may proceed with Cephalosporin use.    Prednisone Other (See Comments)    "It makes me feel like I am out of my body"   Objective:   Today's Vitals   04/01/22 0859  BP: 126/80  Pulse: 87  Temp: 97.6 F (36.4 C)  TempSrc: Temporal  SpO2: 97%  Weight: 233 lb 9.6 oz (106 kg)  Height: 5' 4"$  (1.626 m)   Body mass index is 40.1 kg/m.   General: Well developed, well nourished. No acute distress. Extremities: There is a nodular density noted overlying the flexor tendon of the left 3rd finger. No triggering noted. Psych: Alert and oriented. Normal mood and affect.  There are no preventive care reminders to display for this patient.    Assessment & Plan:   Problem List Items Addressed This Visit       Cardiovascular and Mediastinum    Essential hypertension - Primary    Blood pressure is in good control. In light of her symptoms, which may reflect an adverse effect of her ACE-I, I will switch [name] to lorsartan.       Relevant Medications   losartan (COZAAR) 50 MG tablet   Other Relevant Orders   Basic metabolic panel (Completed)     Other   Prediabetes    I will check her A1c today for ongoing monitoring.      Relevant Orders   Hemoglobin A1c (Completed)   Basic metabolic panel (Completed)   Mixed hyperlipidemia    We will check lipids today. Continue atorvastatin 20 mg daily.      Relevant Medications   losartan (COZAAR) 50 MG tablet   Other Relevant Orders   Lipid panel (Completed)   Other Visit Diagnoses     Ganglion cyst of flexor tendon sheath of finger of left hand       I will refer her to Dr. Amedeo Plenty for assessment of this cyst.   Relevant Orders   Ambulatory referral to Orthopedic Surgery   Ulnar neuropathy at elbow of left upper extremity       I remain suspicious for an ulnar nerve entrapment on the left. I will have Dr. Amedeo Plenty evaluate the hand numbness complaint.   Relevant Orders   Ambulatory referral to Orthopedic Surgery       Return in about 3 months (around 06/30/2022) for Reassessment.   Haydee Salter, MD

## 2022-04-01 NOTE — Assessment & Plan Note (Signed)
Blood pressure is in good control. In light of her symptoms, which may reflect an adverse effect of her ACE-I, I will switch [name] to lorsartan.

## 2022-04-01 NOTE — Assessment & Plan Note (Signed)
I will check her A1c today for ongoing monitoring.

## 2022-04-19 ENCOUNTER — Encounter: Payer: Self-pay | Admitting: Family Medicine

## 2022-04-22 NOTE — Telephone Encounter (Signed)
Forwarding to Dr. Georgina Snell to advise.

## 2022-04-23 ENCOUNTER — Encounter: Payer: Self-pay | Admitting: Family Medicine

## 2022-04-23 ENCOUNTER — Other Ambulatory Visit: Payer: Self-pay

## 2022-04-23 DIAGNOSIS — M5412 Radiculopathy, cervical region: Secondary | ICD-10-CM

## 2022-04-26 ENCOUNTER — Ambulatory Visit (HOSPITAL_COMMUNITY)
Admission: EM | Admit: 2022-04-26 | Discharge: 2022-04-26 | Disposition: A | Discharge status: Home / Self Care | Payer: 59 | Attending: Physician Assistant | Admitting: Physician Assistant

## 2022-04-26 ENCOUNTER — Encounter (HOSPITAL_COMMUNITY): Payer: Self-pay

## 2022-04-26 ENCOUNTER — Telehealth: Payer: Self-pay | Admitting: Family Medicine

## 2022-04-26 DIAGNOSIS — S46811A Strain of other muscles, fascia and tendons at shoulder and upper arm level, right arm, initial encounter: Secondary | ICD-10-CM | POA: Diagnosis not present

## 2022-04-26 DIAGNOSIS — M62838 Other muscle spasm: Secondary | ICD-10-CM | POA: Diagnosis not present

## 2022-04-26 DIAGNOSIS — M542 Cervicalgia: Secondary | ICD-10-CM | POA: Diagnosis not present

## 2022-04-26 MED ORDER — LORAZEPAM 0.5 MG PO TABS: ORAL_TABLET | ORAL | 0 refills | Status: DC

## 2022-04-26 MED ORDER — LIDOCAINE 4 % EX PTCH: 1.0000 | MEDICATED_PATCH | CUTANEOUS | 0 refills | Status: DC

## 2022-04-26 MED ORDER — METHOCARBAMOL 500 MG PO TABS: 500.0000 mg | ORAL_TABLET | Freq: Two times a day (BID) | ORAL | 0 refills | Status: DC

## 2022-04-26 NOTE — ED Provider Notes (Signed)
[facility]    CSN: [id number] Arrival date & time: 04/26/22  1647      History   Chief Complaint Chief Complaint  Patient presents with   Back Pain   Neck Pain    HPI [name] is a 52 y.o. female.   Patient presents today with a 1 week history of severe right-sided neck pain.  She denies any known injury or increase in activity prior to symptom onset.  Reports she woke up that way 1 day and has been persistent since onset without improvement or worsening.  She has tried Tylenol and ibuprofen (up to 800 mg) with minimal improvement of symptoms.  She denies any previous injury or surgery involving her neck.  She reports that pain is rated 6 on a 0-10 pain scale, described as aching/tightness, no aggravating relieving factors identified.  She is confident that she is not pregnant.  Denies any associated headache, fever, nausea, vomiting, visual disturbance.    Past Medical History:  Diagnosis Date   Abnormal Pap smear 2000   [facility] Biopsy   Acute respiratory failure with hypoxia ([place]) 10/19/2019   Allergy    Arthritis    LEFT knee   Back pain    Blood transfusion without reported diagnosis 2005   Breast fibroadenoma    Diverticulitis of colon with perforation 05/20/2014   GERD (gastroesophageal reflux disease)    H/O metrorrhagia 12/2006   H/O myomectomy 07/2006   robotic   H/O sinusitis    Headache(784.0)    Heart murmur    dx age [age]- no issues currently   History of conization of cervix 2000   History of ectopic pregnancy 2005   r salpyngectomy   History of hysterectomy, supracervical 08/2008   robotic   History of syphilis    Hx of adenomatous polyp of colon 12/31/2017   Hx of herpes simplex type 2 infection    Hyperlipidemia    on meds   Hypertension    on meds   Ileostomy present ([place]) 10/26/2014   reversed in 10/2014   Intra-abdominal abscess ([place]) 07/21/2014   PMS (premenstrual syndrome)    Seasonal allergies    Sleep apnea     does not use CPAP    Patient Active Problem List   Diagnosis Date Noted   Uterine leiomyoma 04/01/2022   Ganglion cyst of finger of left hand 02/25/2022   Hypertensive retinopathy of right eye 11/19/2021   Open angle with borderline findings and low glaucoma risk in both eyes 11/19/2021   Obstructive sleep apnea 06/10/2021   Degenerative joint disease (DJD) of lumbar spine 04/17/2021   Derangement of meniscus of left knee 04/17/2021   Osteoarthritis of left hip 04/17/2021   Centrilobular emphysema ([place]) 06/29/2020   Aortic atherosclerosis ([place]) 06/29/2020   COVID-19 virus infection 10/19/2019   Mixed hyperlipidemia 10/19/2019   History of diverticulitis 11/05/2018   Prediabetes 10/08/2018   Class 2 obesity due to excess calories with body mass index (BMI) of 39.0 to 39.9 in adult 03/29/2018   Nonspecific abnormal electrocardiogram (ECG) (EKG) 03/27/2018   Atypical chest pain 03/27/2018   History of hysterectomy, supracervical 02/14/2017   Trigger ring finger of right hand 08/07/2015   Primary osteoarthritis of first carpometacarpal joint of right hand 08/07/2015   Diverticulitis of sigmoid colon s/p colectomy 06/30/2014 07/26/2014   Hx of adenomatous polyp of colon 12/23/2013   Essential hypertension 11/02/2013   Former smoker 11/26/2008    Past Surgical History:  Procedure Laterality  Date   BREAST LUMPECTOMY  11/2009   rt-neg   BREAST SURGERY  2004   fibroademoma   CERVICAL CONE BIOPSY  2000   COLON RESECTION N/A 07/06/2014   Procedure: LAPAROSCOPIC LOOP ILEOSTOMY WITH PLACEMENT OF PELVIC DRAIN;  Surgeon: Donnie Mesa, MD;  Location: [facility];  Service: General;  Laterality: N/A;   COLON SURGERY  10/2014   reversal of ileostomy   COLONOSCOPY  12/2018   GM-MAC-suprep(good)-SSPx 5, end to end colonic anastomosis sigmoidcolon   DILATION AND CURETTAGE OF UTERUS  2009   DIVERTING ILEOSTOMY Right 06/2014   ECTOPIC PREGNANCY SURGERY  2005   R salpyngectomy   ILEOSTOMY N/A  10/26/2014   Procedure: LOOP ILEOSTOMY REVERSAL;  Surgeon: Donnie Mesa, MD;  Location: [facility];  Service: General;  Laterality: N/A;   LAPAROSCOPIC SIGMOID COLECTOMY N/A 06/30/2014   Procedure: LAPAROSCOPIC ASSISTED SIGMOID COLECTOMY;  Surgeon: Donnie Mesa, MD;  Location: [facility];  Service: General;  Laterality: N/A;   MYOMECTOMY  2008   robotic   POLYPECTOMY  2009   POLYPECTOMY  2020   SSP x 5   ROBOTIC ASSISTED LAP VAGINAL HYSTERECTOMY  2010   supracervical   SALPINGECTOMY Right 2005   ectopic pregnancy-RIGHT tube removed   TRIGGER FINGER RELEASE Right 09/28/2012   Procedure: RELEASE TRIGGER FINGER/A-1 PULLEY RIGHT THUMB;  Surgeon: Jolyn Nap, MD;  Location: [facility];  Service: Orthopedics;  Laterality: Right;   ULNAR NERVE TRANSPOSITION Right 12/01/2018   Procedure: ULNAR NERVE DECOMPRESSION;  Surgeon: Daryll Brod, MD;  Location: [facility];  Service: Orthopedics;  Laterality: Right;  AXILLARY   WISDOM TOOTH EXTRACTION  2022    OB History     Gravida  4   Para  2   Term  2   Preterm      AB  2   Living  2      SAB  0   IAB  1   Ectopic  1   Multiple  0   Live Births               Home Medications    Prior to Admission medications   Medication Sig Start Date End Date Taking? Authorizing Provider  albuterol (VENTOLIN HFA) 108 (90 Base) MCG/ACT inhaler Inhale 1-2 puffs into the lungs every 6 (six) hours as needed for wheezing or shortness of breath. 10/31/21  Yes Francene Finders, PA-C  amLODipine (NORVASC) 5 MG tablet TAKE 1 TABLET(5 MG) BY MOUTH DAILY 03/13/22  Yes Haydee Salter, MD  atorvastatin (LIPITOR) 20 MG tablet TAKE 1 TABLET(20 MG) BY MOUTH DAILY 03/13/22  Yes Haydee Salter, MD  Black Cohosh 20 MG TABS Take 40 mg by mouth daily.    Yes [provider]  lidocaine (HM LIDOCAINE PATCH) 4 % Place 1 patch onto the skin daily. 04/26/22  Yes Minoru Chap K, PA-C  losartan (COZAAR) 50 MG tablet Take 1 tablet  (50 mg total) by mouth daily. 04/01/22  Yes Haydee Salter, MD  methocarbamol (ROBAXIN) 500 MG tablet Take 1 tablet (500 mg total) by mouth 2 (two) times daily. 04/26/22  Yes Acey Woodfield K, PA-C  Multiple Vitamins-Minerals (HAIR/SKIN/NAILS/BIOTIN PO) Take 3 tablets by mouth daily.   Yes [provider]  LORazepam (ATIVAN) 0.5 MG tablet 1-2 tabs 30 - 60 min prior to MRI. Do not drive with this medicine. 04/26/22   Gregor Hams, MD  nitroGLYCERIN (NITROSTAT) 0.4 MG SL tablet Place 1  tablet (0.4 mg total) under the tongue every 5 (five) minutes as needed for up to 25 days for chest pain. 02/14/21 04/01/22  Adrian Prows, MD    Family History Family History  Problem Relation Age of Onset   Sarcoidosis Mother    Hypertension Mother    Hyperlipidemia Mother    Prostate cancer Father 84       radiation seed   Fibroids Sister    Thyroid disease Maternal Aunt    Heart disease Paternal Uncle    Hypertension Maternal Grandmother    Hypertension Paternal Grandmother    Diabetes Paternal Grandmother    Mental illness Cousin    Colon cancer Neg Hx    Colon polyps Neg Hx    Esophageal cancer Neg Hx    Rectal cancer Neg Hx    Stomach cancer Neg Hx    Sleep apnea Neg Hx     Social History Social History   Tobacco Use   Smoking status: Former    Packs/day: 0.50    Years: 20.00    Total pack years: 10.00    Types: Cigarettes    Quit date: 06/13/2011    Years since quitting: 10.8   Smokeless tobacco: Never  Vaping Use   Vaping Use: Never used  Substance Use Topics   Alcohol use: Not Currently   Drug use: No     Allergies   Keflex [cephalexin], Penicillins, and Prednisone   Review of Systems Review of Systems  Constitutional:  Positive for activity change. Negative for appetite change, fatigue and fever.  Eyes:  Negative for photophobia and visual disturbance.  Gastrointestinal:  Negative for abdominal pain, diarrhea, nausea and vomiting.  Musculoskeletal:  Positive for neck  pain. Negative for arthralgias and myalgias.  Neurological:  Negative for dizziness, light-headedness and headaches.     Physical Exam Triage Vital Signs ED Triage Vitals  Enc Vitals Group     BP 04/26/22 1720 124/72     Pulse Rate 04/26/22 1720 (!) 102     Resp 04/26/22 1720 16     Temp 04/26/22 1720 98.3 F (36.8 C)     Temp Source 04/26/22 1720 Oral     SpO2 04/26/22 1720 95 %     Weight 04/26/22 1720 228 lb (103.4 kg)     Height 04/26/22 1720 '5\' 4"'$  (1.626 m)     Head Circumference --      Peak Flow --      Pain Score 04/26/22 1718 6     Pain Loc --      Pain Edu? --      Excl. in [place]? --    No data found.  Updated Vital Signs BP 124/72 (BP Location: Left Arm)   Pulse 96   Temp 98.3 F (36.8 C) (Oral)   Resp 16   Ht '5\' 4"'$  (1.626 m)   Wt 228 lb (103.4 kg)   SpO2 95%   BMI 39.14 kg/m   Visual Acuity Right Eye Distance:   Left Eye Distance:   Bilateral Distance:    Right Eye Near:   Left Eye Near:    Bilateral Near:     Physical Exam Vitals reviewed.  Constitutional:      General: She is awake. She is not in acute distress.    Appearance: Normal appearance. She is well-developed. She is not ill-appearing.     Comments: Very pleasant female appears stated age in no acute distress sitting comfortably in exam room  HENT:  Head: Normocephalic and atraumatic.  Cardiovascular:     Rate and Rhythm: Normal rate and regular rhythm.     Heart sounds: Normal heart sounds, S1 normal and S2 normal. No murmur heard. Pulmonary:     Effort: Pulmonary effort is normal.     Breath sounds: Normal breath sounds. No wheezing, rhonchi or rales.     Comments: Clear to auscultation bilaterally Abdominal:     Palpations: Abdomen is soft.     Tenderness: There is no abdominal tenderness.  Musculoskeletal:     Cervical back: Spasms and tenderness present. No bony tenderness. Pain with movement present.     Thoracic back: No tenderness or bony tenderness.     Lumbar back:  No tenderness or bony tenderness.     Comments: Tenderness palpation over right trapezius with associated spasm.  Decreasing to motion with lateral flexion and rotation of neck secondary to pain.  No bony tenderness or pain percussion of vertebrae.  No deformity or step-off noted.  Strength 5/5 bilateral upper and lower extremities.  Hands neurovascularly intact.  Psychiatric:        Behavior: Behavior is cooperative.      UC Treatments / Results  Labs (all labs ordered are listed, but only abnormal results are displayed) Labs Reviewed - No data to display  EKG   Radiology No results found.  Procedures Procedures (including critical care time)  Medications Ordered in UC Medications - No data to display  Initial Impression / Assessment and Plan / UC Course  I have reviewed the triage vital signs and the nursing notes.  Pertinent labs & imaging results that were available during my care of the patient were reviewed by me and considered in my medical decision making (see chart for details).     Patient is well-appearing, afebrile, nontoxic, nontachycardic.  Plan films were deferred as she denies any recent trauma and has no focal bony tenderness.  Concern for spasm causing symptoms given clinical presentation.  She was encouraged to continue over-the-counter medications including Tylenol and ibuprofen up to 800 mg 3 times a day.  She was given lidocaine patches for specific areas of pain with discussion that this will be applied for 12 hours and then remove for minimum of 12 hours (using only 1 patch per 24 hours).  She was given methocarbamol up to twice a day to help with pain as well as muscle spasms.  Discussed that this can be sedating and she is not to drive or drink alcohol while taking it.  Recommended heat and gentle stretch.  If her symptoms or not improving quickly she is to follow-up with sports medicine for further evaluation and management as she may benefit from physical  therapy or other interventions that we cannot arrange in urgent care.  Discussed that if she has any worsening symptoms including increasing pain, weakness or numbness in her extremities, fever, headache, dizziness, nausea, vomiting she needs to be seen immediately.  Strict return precautions given.  Work excuse note provided.  Final Clinical Impressions(s) / UC Diagnoses   Final diagnoses:  Strain of right trapezius muscle, initial encounter  Neck pain  Muscle spasms of neck     Discharge Instructions      I believe your pain is related to muscle spasms in your neck.  Please use Robaxin to twice a day.  This will make you sleepy so do not drive or drink alcohol with taking it.  Use over-the-counter medications including Tylenol and ibuprofen for additional  pain relief.  You can use lidocaine patches during the day and then remove them at night.  If your symptoms do not improving quickly please follow-up with sports medicine as you may benefit from physical therapy and other interventions that we cannot arrange in urgent care.  If anything worsens and you have worsening pain, fever, headache, nausea/vomiting interfere with oral intake you need to be seen immediately.     ED Prescriptions     Medication Sig Dispense Auth. Provider   lidocaine (HM LIDOCAINE PATCH) 4 % Place 1 patch onto the skin daily. 10 patch Aleese Kamps K, PA-C   methocarbamol (ROBAXIN) 500 MG tablet Take 1 tablet (500 mg total) by mouth 2 (two) times daily. 20 tablet Langston Tuberville, Derry Skill, PA-C      PDMP not reviewed this encounter.   Terrilee Croak, PA-C 04/26/22 1810

## 2022-04-26 NOTE — Telephone Encounter (Signed)
Received communication from MRI tech that patient will need anxiety medication for MRI.  Ativan prescribed.

## 2022-04-26 NOTE — Discharge Instructions (Addendum)
I believe your pain is related to muscle spasms in your neck.  Please use Robaxin to twice a day.  This will make you sleepy so do not drive or drink alcohol with taking it.  Use over-the-counter medications including Tylenol and ibuprofen for additional pain relief.  You can use lidocaine patches during the day and then remove them at night.  If your symptoms do not improving quickly please follow-up with sports medicine as you may benefit from physical therapy and other interventions that we cannot arrange in urgent care.  If anything worsens and you have worsening pain, fever, headache, nausea/vomiting interfere with oral intake you need to be seen immediately.

## 2022-04-26 NOTE — ED Triage Notes (Signed)
Chief Complaint: neck and back pain. No known falls injuries or strain. History of DDD in the low back.   Onset: 1 week   Prescriptions or OTC medications tried: Yes- Motrin    with little relief

## 2022-05-06 ENCOUNTER — Other Ambulatory Visit: Payer: 59

## 2022-05-09 ENCOUNTER — Other Ambulatory Visit: Payer: Self-pay

## 2022-05-09 DIAGNOSIS — I10 Essential (primary) hypertension: Secondary | ICD-10-CM

## 2022-05-09 MED ORDER — LOSARTAN POTASSIUM 50 MG PO TABS: 50.0000 mg | ORAL_TABLET | Freq: Every day | ORAL | 2 refills | Status: DC

## 2022-07-01 ENCOUNTER — Ambulatory Visit: Payer: 59 | Admitting: Family Medicine

## 2022-07-07 ENCOUNTER — Other Ambulatory Visit: Payer: Self-pay

## 2022-07-07 ENCOUNTER — Emergency Department (HOSPITAL_COMMUNITY)
Admission: EM | Admit: 2022-07-07 | Discharge: 2022-07-07 | Disposition: A | Discharge status: Home / Self Care | Payer: 59 | Attending: Emergency Medicine | Admitting: Emergency Medicine

## 2022-07-07 ENCOUNTER — Emergency Department (HOSPITAL_COMMUNITY): Payer: 59

## 2022-07-07 ENCOUNTER — Encounter (HOSPITAL_COMMUNITY): Payer: Self-pay | Admitting: *Deleted

## 2022-07-07 DIAGNOSIS — R1084 Generalized abdominal pain: Secondary | ICD-10-CM

## 2022-07-07 DIAGNOSIS — I10 Essential (primary) hypertension: Secondary | ICD-10-CM | POA: Diagnosis not present

## 2022-07-07 DIAGNOSIS — Z79899 Other long term (current) drug therapy: Secondary | ICD-10-CM | POA: Diagnosis not present

## 2022-07-07 DIAGNOSIS — R1032 Left lower quadrant pain: Secondary | ICD-10-CM | POA: Diagnosis not present

## 2022-07-07 DIAGNOSIS — R109 Unspecified abdominal pain: Secondary | ICD-10-CM | POA: Diagnosis present

## 2022-07-07 LAB — COMPREHENSIVE METABOLIC PANEL
ALT: 26 U/L (ref 0–44)
AST: 24 U/L (ref 15–41)
Albumin: 4 g/dL (ref 3.5–5.0)
Alkaline Phosphatase: 53 U/L (ref 38–126)
Anion gap: 11 (ref 5–15)
BUN: 11 mg/dL (ref 6–20)
CO2: 25 mmol/L (ref 22–32)
Calcium: 9.4 mg/dL (ref 8.9–10.3)
Chloride: 101 mmol/L (ref 98–111)
Creatinine, Ser: 0.83 mg/dL (ref 0.44–1.00)
GFR, Estimated: >60 mL/min (ref >60)
Glucose, Bld: 100 mg/dL — ABNORMAL HIGH (ref 70–99)
Potassium: 3.6 mmol/L (ref 3.5–5.1)
Sodium: 137 mmol/L (ref 135–145)
Total Bilirubin: 0.8 mg/dL (ref 0.3–1.2)
Total Protein: 7.2 g/dL (ref 6.5–8.1)

## 2022-07-07 LAB — URINALYSIS, ROUTINE W REFLEX MICROSCOPIC
Bilirubin Urine: NEGATIVE
Glucose, UA: NEGATIVE mg/dL
Hgb urine dipstick: NEGATIVE
Ketones, ur: NEGATIVE mg/dL
Leukocytes,Ua: NEGATIVE
Nitrite: NEGATIVE
Protein, ur: NEGATIVE mg/dL
Specific Gravity, Urine: 1.015 (ref 1.005–1.030)
pH: 5 (ref 5.0–8.0)

## 2022-07-07 LAB — CBC
HCT: 43.3 % (ref 36.0–46.0)
Hemoglobin: 13.6 g/dL (ref 12.0–15.0)
MCH: 29.4 pg (ref 26.0–34.0)
MCHC: 31.4 g/dL (ref 30.0–36.0)
MCV: 93.7 fL (ref 80.0–100.0)
Platelets: 372 10*3/uL (ref 150–400)
RBC: 4.62 MIL/uL (ref 3.87–5.11)
RDW: 12.9 % (ref 11.5–15.5)
WBC: 10.2 10*3/uL (ref 4.0–10.5)
nRBC: 0 % (ref 0.0–0.2)

## 2022-07-07 LAB — LIPASE, BLOOD: Lipase: 46 U/L (ref 11–51)

## 2022-07-07 MED ORDER — FENTANYL CITRATE PF 50 MCG/ML IJ SOSY
100.0000 ug | PREFILLED_SYRINGE | Freq: Once | INTRAMUSCULAR | Status: AC
  Administered 2022-07-07: 100 ug via INTRAVENOUS
  Filled 2022-07-07: qty 2

## 2022-07-07 MED ORDER — IOHEXOL 350 MG/ML SOLN
75.0000 mL | Freq: Once | INTRAVENOUS | Status: AC | PRN
  Administered 2022-07-07: 75 mL via INTRAVENOUS

## 2022-07-07 MED ORDER — ONDANSETRON HCL 4 MG/2ML IJ SOLN
4.0000 mg | Freq: Once | INTRAMUSCULAR | Status: AC
  Administered 2022-07-07: 4 mg via INTRAVENOUS
  Filled 2022-07-07: qty 2

## 2022-07-07 MED ORDER — DICYCLOMINE HCL 20 MG PO TABS: 20.0000 mg | ORAL_TABLET | Freq: Two times a day (BID) | ORAL | 0 refills | Status: DC | PRN

## 2022-07-07 NOTE — ED Notes (Signed)
Pt in bed, pt states that she is ready to go home, pt verbalized understanding d/c and follow up, pt from dpt.  

## 2022-07-07 NOTE — ED Provider Notes (Signed)
[place] EMERGENCY DEPARTMENT AT [facility] Provider Note   CSN: [id number] Arrival date & time: 07/07/22  0446     History  Chief Complaint  Patient presents with   Abdominal Pain    [name] is a 52 y.o. female.  The history is provided by the patient and the spouse.  Abdominal Pain Associated symptoms: no dysuria, no fever, no nausea and no vomiting    Patient with extensive history including hypertension, hyperlipidemia, previous colectomy, previous hysterectomy presents with abdominal pain.  Patient reports onset of intermittent cramping lower abdominal pain over 2 days ago.  No fevers or vomiting, but she does report loose bowel movements.  No bloody stools.  Now over the past day the pain is worsened and is sharp in her left lower quadrant.  Denies any urinary symptoms.  She had otherwise been at her baseline prior to this episode Past Medical History:  Diagnosis Date   Abnormal Pap smear 2000   [facility] Biopsy   Acute respiratory failure with hypoxia ([facility]) 10/19/2019   Allergy    Arthritis    LEFT knee   Back pain    Blood transfusion without reported diagnosis 2005   Breast fibroadenoma    Diverticulitis of colon with perforation 05/20/2014   GERD (gastroesophageal reflux disease)    H/O metrorrhagia 12/2006   H/O myomectomy 07/2006   robotic   H/O sinusitis    Headache(784.0)    Heart murmur    dx age [age]- no issues currently   History of conization of cervix 2000   History of ectopic pregnancy 2005   r salpyngectomy   History of hysterectomy, supracervical 08/2008   robotic   History of syphilis    Hx of adenomatous polyp of colon 12/31/2017   Hx of herpes simplex type 2 infection    Hyperlipidemia    on meds   Hypertension    on meds   Ileostomy present ([facility]) 10/26/2014   reversed in 10/2014   Intra-abdominal abscess ([facility]) 07/21/2014   PMS (premenstrual syndrome)    Seasonal allergies    Sleep apnea    does not use CPAP     Home Medications Prior to Admission medications   Medication Sig Start Date End Date Taking? Authorizing Provider  albuterol (VENTOLIN HFA) 108 (90 Base) MCG/ACT inhaler Inhale 1-2 puffs into the lungs every 6 (six) hours as needed for wheezing or shortness of breath. 10/31/21   Tomi Bamberger, PA-C  amLODipine (NORVASC) 5 MG tablet TAKE 1 TABLET(5 MG) BY MOUTH DAILY 03/13/22   Loyola Mast, MD  atorvastatin (LIPITOR) 20 MG tablet TAKE 1 TABLET(20 MG) BY MOUTH DAILY 03/13/22   Loyola Mast, MD  Black Cohosh 20 MG TABS Take 40 mg by mouth daily.     [provider]  lidocaine (HM LIDOCAINE PATCH) 4 % Place 1 patch onto the skin daily. 04/26/22   Raspet, Erin K, PA-C  LORazepam (ATIVAN) 0.5 MG tablet 1-2 tabs 30 - 60 min prior to MRI. Do not drive with this medicine. 04/26/22   Rodolph Bong, MD  losartan (COZAAR) 50 MG tablet Take 1 tablet (50 mg total) by mouth daily. 05/09/22   Loyola Mast, MD  methocarbamol (ROBAXIN) 500 MG tablet Take 1 tablet (500 mg total) by mouth 2 (two) times daily. 04/26/22   Raspet, Noberto Retort, PA-C  Multiple Vitamins-Minerals (HAIR/SKIN/NAILS/BIOTIN PO) Take 3 tablets by mouth daily.    [provider]  nitroGLYCERIN (NITROSTAT)  0.4 MG SL tablet Place 1 tablet (0.4 mg total) under the tongue every 5 (five) minutes as needed for up to 25 days for chest pain. 02/14/21 04/01/22  Yates Decamp, MD      Allergies    Keflex [cephalexin], Penicillins, and Prednisone    Review of Systems   Review of Systems  Constitutional:  Negative for fever.  Gastrointestinal:  Positive for abdominal pain. Negative for blood in stool, nausea and vomiting.  Genitourinary:  Negative for dysuria.    Physical Exam Updated Vital Signs BP (!) 147/82 (BP Location: Right Arm)   Pulse (!) 106   Temp 98.4 F (36.9 C)   Resp 20   Ht 1.626 m (5\' 4" )   Wt 106.6 kg   SpO2 96%   BMI 40.34 kg/m  Physical Exam CONSTITUTIONAL: Well developed/well nourished, anxious HEAD:  Normocephalic/atraumatic EYES: EOMI/PERRL ENMT: Mucous membranes moist NECK: supple no meningeal signs CV: S1/S2 noted, no murmurs/rubs/gallops noted LUNGS: Lungs are clear to auscultation bilaterally, no apparent distress ABDOMEN: soft, moderate LLQ tenderness, no rebound or guarding, bowel sounds noted throughout abdomen GU:no cva tenderness NEURO: Pt is awake/alert/appropriate, moves all extremitiesx4.  No facial droop.   EXTREMITIES: pulses normal/equal, full ROM SKIN: warm, color normal PSYCH: Anxious  ED Results / Procedures / Treatments   Labs (all labs ordered are listed, but only abnormal results are displayed) Labs Reviewed  COMPREHENSIVE METABOLIC PANEL - Abnormal; Notable for the following components:      Result Value   Glucose, Bld 100 (*)    All other components within normal limits  LIPASE, BLOOD  CBC  URINALYSIS, ROUTINE W REFLEX MICROSCOPIC    EKG EKG Interpretation  Date/Time:  Sunday Jul 07 2022 05:31:36 EDT Ventricular Rate:  87 PR Interval:  131 QRS Duration: 81 QT Interval:  402 QTC Calculation: 484 R Axis:   78 Text Interpretation: Sinus rhythm RSR' in V1 or V2, probably normal variant Confirmed by Zadie Rhine ([id number]) on 07/07/2022 5:46:50 AM  Radiology No results found.  Procedures Procedures    Medications Ordered in ED Medications  fentaNYL (SUBLIMAZE) injection 100 mcg (has no administration in time range)  ondansetron (ZOFRAN) injection 4 mg (has no administration in time range)    ED Course/ Medical Decision Making/ A&P Clinical Course as of 07/07/22 0703  Sun Jul 07, 2022  0701 Patient with previous history of colectomy presenting with lower abdominal pain.  CT imaging pending at this time Signed out to Dr Particia Nearing at shift change [DW]    Clinical Course User Index [DW] Zadie Rhine, MD                             Medical Decision Making Amount and/or Complexity of Data Reviewed Labs: ordered. Radiology:  ordered. ECG/medicine tests: ordered.  Risk Prescription drug management.   This patient presents to the ED for concern of abdominal pain, this involves an extensive number of treatment options, and is a complaint that carries with it a high risk of complications and morbidity.  The differential diagnosis includes but is not limited to cholecystitis, cholelithiasis, pancreatitis, gastritis, peptic ulcer disease, appendicitis, bowel obstruction, bowel perforation, diverticulitis, AAA, ischemic bowel, acute coronary syndrome    Comorbidities that complicate the patient evaluation: Patient's presentation is complicated by their history of colectomy  Additional history obtained: Records reviewed  previous imaging records reviewed  Lab Tests: I Ordered, and personally interpreted labs.  The pertinent results include:  Labs unremarkable  Imaging Studies ordered: I ordered imaging studies including CT scan abdomen/pelvis  Medicines ordered and prescription drug management: I ordered medication including fentanyl  for pain    Reevaluation: After the interventions noted above, I reevaluated the patient and found that they have :stayed the same  Complexity of problems addressed: Patient's presentation is most consistent with  acute presentation with potential threat to life or bodily function         Final Clinical Impression(s) / ED Diagnoses Final diagnoses:  Left lower quadrant abdominal pain    Rx / DC Orders ED Discharge Orders     None         Zadie Rhine, MD 07/07/22 [phone]

## 2022-07-07 NOTE — ED Provider Notes (Signed)
Pt signed out by Dr. Bebe Shaggy pending CT abd/pelvis.  CT reviewed by me.  I agree with the radiologist.  CT:   No acute findings are noted in the abdomen or pelvis to account  for the patient's symptoms.  2. Colonic diverticulosis without evidence of acute diverticulitis  at this time.  3. Aortic atherosclerosis.  4. Postoperative changes and additional incidental findings, as  above.   Labs are normal.  Pt feels better.  She is stable for d/c.  Return if worse.  F/u with pcp.    Jacalyn Lefevre, MD 07/07/22 [phone]

## 2022-07-07 NOTE — ED Triage Notes (Signed)
C/o abd. Pain onset Thurs, cramping , states today pain was worse and constant , no change with bowel habits. Denies n/v

## 2022-07-21 ENCOUNTER — Other Ambulatory Visit: Payer: Self-pay | Admitting: Family Medicine

## 2022-07-21 DIAGNOSIS — I10 Essential (primary) hypertension: Secondary | ICD-10-CM

## 2022-07-21 DIAGNOSIS — E782 Mixed hyperlipidemia: Secondary | ICD-10-CM

## 2022-08-05 ENCOUNTER — Ambulatory Visit: Payer: 59 | Admitting: Family Medicine

## 2022-10-03 ENCOUNTER — Encounter (INDEPENDENT_AMBULATORY_CARE_PROVIDER_SITE_OTHER): Payer: Self-pay

## 2022-11-11 ENCOUNTER — Ambulatory Visit (INDEPENDENT_AMBULATORY_CARE_PROVIDER_SITE_OTHER): Payer: 59 | Admitting: Family Medicine

## 2022-11-11 ENCOUNTER — Encounter: Payer: Self-pay | Admitting: Family Medicine

## 2022-11-11 VITALS — BP 128/80 | HR 95 | Temp 97.6°F | Ht 64.0 in | Wt 237.4 lb

## 2022-11-11 DIAGNOSIS — R058 Other specified cough: Secondary | ICD-10-CM | POA: Insufficient documentation

## 2022-11-11 DIAGNOSIS — Z0000 Encounter for general adult medical examination without abnormal findings: Secondary | ICD-10-CM

## 2022-11-11 DIAGNOSIS — E782 Mixed hyperlipidemia: Secondary | ICD-10-CM | POA: Diagnosis not present

## 2022-11-11 DIAGNOSIS — I10 Essential (primary) hypertension: Secondary | ICD-10-CM

## 2022-11-11 DIAGNOSIS — R002 Palpitations: Secondary | ICD-10-CM | POA: Insufficient documentation

## 2022-11-11 MED ORDER — METOPROLOL TARTRATE 25 MG PO TABS
25.0000 mg | ORAL_TABLET | Freq: Two times a day (BID) | ORAL | 3 refills | Status: DC
Start: 2022-11-11 — End: 2023-01-27

## 2022-11-11 NOTE — Assessment & Plan Note (Signed)
This may be due to allergies vs. acid reflux. I recommend a 30-day trial of an antihistamine to see if this will improve. If not, she should consider a 30-day trial of a PPI.

## 2022-11-11 NOTE — Assessment & Plan Note (Signed)
Lipids are at goal. Continue atorvastatin 20 mg daily. 

## 2022-11-11 NOTE — Assessment & Plan Note (Signed)
Discussed avoidance of caffeine. I will place her on metoprolol and reassess at her next visit.

## 2022-11-11 NOTE — Assessment & Plan Note (Addendum)
Blood pressure is in good control. In light of her symptoms, which may reflect an adverse effect of her lorsartan, I will try switching her to metoprolol. This could also help with her palpitations. I would not characterize her throat issues as an allergy, as there may be another cause. She will continue her amlodipine 5 mg daily.

## 2022-11-11 NOTE — Progress Notes (Signed)
[facility] [address] [place] [address] Dept: [phone] Dept Fax: [phone]  Annual Physical Visit  Subjective:    Patient ID: [name], female    DOB: 1970/06/25, 52 y.o..   MRN: [id number]  Chief Complaint  Patient presents with   Annual Exam    CPE/labs.  Fasting today.      History of Present Illness:  Patient is in today for an annual physical/preventative visit.  Ms. Herringshaw has a history of hypertension, managed with amlodipine 5 mg daily and losartan 50 mg daily. She complains of a tickle sensation in her throat, which she had attributed to her previous lisinopril and now attributes to losartan. She does not necessarily note issues with allergic symptoms and denies heart burn. However, she does note awakening at night with cough. She finds this is not necessarily productive.   Ms. Carchi has hyperlipidemia, managed with atorvastatin 20 mg daily.    Ms. Hartfield has prediabetes.    Ms. Chue is currently seeing Dr. Amanda Pea. She has a past history of right ulnar nerve entrapment and underwent a transposition surgery in 2020. She has had issues with bilateral hand numbness and notes she is pending a NCS.  Review of Systems  Constitutional:  Negative for chills, diaphoresis, fever, malaise/fatigue and weight loss.  HENT:  Negative for congestion, ear pain, hearing loss, sinus pain, sore throat and tinnitus.   Eyes:  Negative for blurred vision, pain, discharge and redness.  Respiratory:  Positive for cough. Negative for shortness of breath and wheezing.        Cough as noted above.  Cardiovascular:  Positive for palpitations. Negative for chest pain.       Notes occasional episodes of rapid heart rate. She admits to having increased drinking [facility] in the more recent weeks. In general keeps other caffeinated beverages to a minimum.  Gastrointestinal:  Positive for abdominal pain. Negative for  constipation, diarrhea, heartburn, nausea and vomiting.       Chronic abdominal discomfort related to diverticular disease.  Musculoskeletal:  Negative for back pain, joint pain and myalgias.  Skin:  Negative for itching and rash.  Psychiatric/Behavioral:  Negative for depression. The patient is not nervous/anxious.    Past Medical History: Patient Active Problem List   Diagnosis Date Noted   Nocturnal cough 11/11/2022   Palpitations 11/11/2022   Ganglion cyst of finger of left hand 02/25/2022   Hypertensive retinopathy of right eye 11/19/2021   Open angle with borderline findings and low glaucoma risk in both eyes 11/19/2021   Obstructive sleep apnea 06/10/2021   Degenerative joint disease (DJD) of lumbar spine 04/17/2021   Derangement of meniscus of left knee 04/17/2021   Osteoarthritis of left hip 04/17/2021   Centrilobular emphysema (HCC) 06/29/2020   Aortic atherosclerosis (HCC) 06/29/2020   Mixed hyperlipidemia 10/19/2019   History of diverticulitis 11/05/2018   Entrapment of right ulnar nerve 10/19/2018   Prediabetes 10/08/2018   Class 2 obesity due to excess calories with body mass index (BMI) of 39.0 to 39.9 in adult 03/29/2018   Nonspecific abnormal electrocardiogram (ECG) (EKG) 03/27/2018   Atypical chest pain 03/27/2018   History of hysterectomy, supracervical 02/14/2017   Trigger ring finger of right hand 08/07/2015   Primary osteoarthritis of first carpometacarpal joint of right hand 08/07/2015   Diverticulitis of sigmoid colon s/p colectomy 06/30/2014 07/26/2014   Hx of adenomatous polyp of colon 12/23/2013   Essential hypertension 11/02/2013   Former smoker  11/26/2008   Past Surgical History:  Procedure Laterality Date   BREAST LUMPECTOMY  11/2009   rt-neg   BREAST SURGERY  2004   fibroademoma   CERVICAL CONE BIOPSY  2000   COLON RESECTION N/A 07/06/2014   Procedure: LAPAROSCOPIC LOOP ILEOSTOMY WITH PLACEMENT OF PELVIC DRAIN;  Surgeon: Manus Bertram Haddix, MD;   Location: [facility];  Service: General;  Laterality: N/A;   COLON SURGERY  10/2014   reversal of ileostomy   COLONOSCOPY  12/2018   GM-MAC-suprep(good)-SSPx 5, end to end colonic anastomosis sigmoidcolon   DILATION AND CURETTAGE OF UTERUS  2009   DIVERTING ILEOSTOMY Right 06/2014   ECTOPIC PREGNANCY SURGERY  2005   R salpyngectomy   ILEOSTOMY N/A 10/26/2014   Procedure: LOOP ILEOSTOMY REVERSAL;  Surgeon: Manus Shalonda Sachse, MD;  Location: [facility];  Service: General;  Laterality: N/A;   LAPAROSCOPIC SIGMOID COLECTOMY N/A 06/30/2014   Procedure: LAPAROSCOPIC ASSISTED SIGMOID COLECTOMY;  Surgeon: Manus Brogen Duell, MD;  Location: [facility];  Service: General;  Laterality: N/A;   MYOMECTOMY  2008   robotic   POLYPECTOMY  2009   POLYPECTOMY  2020   SSP x 5   ROBOTIC ASSISTED LAP VAGINAL HYSTERECTOMY  2010   supracervical   SALPINGECTOMY Right 2005   ectopic pregnancy-RIGHT tube removed   TRIGGER FINGER RELEASE Right 09/28/2012   Procedure: RELEASE TRIGGER FINGER/A-1 PULLEY RIGHT THUMB;  Surgeon: Jodi Marble, MD;  Location: [facility];  Service: Orthopedics;  Laterality: Right;   ULNAR NERVE TRANSPOSITION Right 12/01/2018   Procedure: ULNAR NERVE DECOMPRESSION;  Surgeon: Cindee Salt, MD;  Location:  SURGERY CENTER;  Service: Orthopedics;  Laterality: Right;  AXILLARY   WISDOM TOOTH EXTRACTION  2022   Family History  Problem Relation Age of Onset   Sarcoidosis Mother    Hypertension Mother    Hyperlipidemia Mother    Prostate cancer Father 62       radiation seed   Fibroids Sister    Thyroid disease Maternal Aunt    Heart disease Paternal Uncle    Hypertension Maternal Grandmother    Hypertension Paternal Grandmother    Diabetes Paternal Grandmother    Mental illness Cousin    Colon cancer Neg Hx    Colon polyps Neg Hx    Esophageal cancer Neg Hx    Rectal cancer Neg Hx    Stomach cancer Neg Hx    Sleep apnea Neg Hx    Outpatient Medications Prior to Visit   Medication Sig Dispense Refill   albuterol (VENTOLIN HFA) 108 (90 Base) MCG/ACT inhaler Inhale 1-2 puffs into the lungs every 6 (six) hours as needed for wheezing or shortness of breath. 8 g 0   amLODipine (NORVASC) 5 MG tablet TAKE 1 TABLET BY MOUTH DAILY 90 tablet 3   atorvastatin (LIPITOR) 20 MG tablet TAKE 1 TABLET BY MOUTH DAILY 90 tablet 3   Black Cohosh (REMIFEMIN MENOPAUSE PO)      Black Cohosh 20 MG TABS Take 40 mg by mouth daily.      lidocaine (HM LIDOCAINE PATCH) 4 % Place 1 patch onto the skin daily. 10 patch 0   Multiple Vitamins-Minerals (HAIR/SKIN/NAILS/BIOTIN PO) Take 3 tablets by mouth daily.     losartan (COZAAR) 50 MG tablet Take 1 tablet (50 mg total) by mouth daily. 90 tablet 2   dicyclomine (BENTYL) 20 MG tablet Take 1 tablet (20 mg total) by mouth 2 (two) times daily as needed (abdominal spasms). (Patient not taking: Reported on 11/11/2022)  20 tablet 0   methocarbamol (ROBAXIN) 500 MG tablet Take 1 tablet (500 mg total) by mouth 2 (two) times daily. (Patient not taking: Reported on 11/11/2022) 20 tablet 0   nitroGLYCERIN (NITROSTAT) 0.4 MG SL tablet Place 1 tablet (0.4 mg total) under the tongue every 5 (five) minutes as needed for up to 25 days for chest pain. 25 tablet 1   LORazepam (ATIVAN) 0.5 MG tablet 1-2 tabs 30 - 60 min prior to MRI. Do not drive with this medicine. 4 tablet 0   No facility-administered medications prior to visit.   Allergies  Allergen Reactions   Keflex [Cephalexin] Anaphylaxis   Penicillins Anaphylaxis    Has patient had a PCN reaction causing immediate rash, facial/tongue/throat swelling, SOB or lightheadedness with hypotension: Yes Has patient had a PCN reaction causing severe rash involving mucus membranes or skin necrosis: No Has patient had a PCN reaction that required hospitalization: No Has patient had a PCN reaction occurring within the last 10 years: No If all of the above answers are "NO", then may proceed with Cephalosporin use.     Prednisone Other (See Comments)    "It makes me feel like I am out of my body"   Objective:   Today's Vitals   11/11/22 0851  BP: 128/80  Pulse: 95  Temp: 97.6 F (36.4 C)  TempSrc: Temporal  SpO2: 97%  Weight: 237 lb 6.4 oz (107.7 kg)  Height: 5\' 4"  (1.626 m)   Body mass index is 40.75 kg/m.   General: Well developed, well nourished. No acute distress. HEENT: Normocephalic, non-traumatic. PERRL, EOMI. Conjunctiva clear. External ears normal. EAC and TMs normal bilaterally.   Nose with mild congestion but no rhinorrhea. Mucous membranes moist. Oropharynx clear. Good dentition. Neck: Supple. No lymphadenopathy. No thyromegaly. Lungs: Clear to auscultation bilaterally. No wheezing, rales or rhonchi. CV: RRR without murmurs or rubs. Pulses 2+ bilaterally. Abdomen: Soft. Bowel sounds positive, normal pitch and frequency. No hepatosplenomegaly. Mild left-sided tenderness, but no   rebound or guarding. Extremities: Full ROM. No joint swelling or tenderness. Moderate crepitance of the left knee. No edema noted. Skin: Warm and dry. No rashes. Psych: Alert and oriented. Normal mood and affect.  There are no preventive care reminders to display for this patient.    Assessment & Plan:   Problem List Items Addressed This Visit       Cardiovascular and Mediastinum   Essential hypertension    Blood pressure is in good control. In light of her symptoms, which may reflect an adverse effect of her lorsartan, I will try switching her to metoprolol. This could also help with her palpitations. I would not characterize her throat issues as an allergy, as there may be another cause. She will continue her amlodipine 5 mg daily.       Relevant Medications   metoprolol tartrate (LOPRESSOR) 25 MG tablet     Other   Mixed hyperlipidemia    Lipids are at goal. Continue atorvastatin 20 mg daily.      Relevant Medications   metoprolol tartrate (LOPRESSOR) 25 MG tablet   Nocturnal cough     This may be due to allergies vs. acid reflux. I recommend a 30-day trial of an antihistamine to see if this will improve. If not, she should consider a 30-day trial of a PPI.      Palpitations    Discussed avoidance of caffeine. I will place her on metoprolol and reassess at her next visit.  Relevant Medications   metoprolol tartrate (LOPRESSOR) 25 MG tablet   Other Visit Diagnoses     Annual physical exam    -  Primary   Overall good health. Reviewed indicated screenings and immunizations.       Return in about 2 months (around 01/11/2023) for Reassessment.   Loyola Mast, MD

## 2022-11-25 LAB — HM DIABETES EYE EXAM

## 2022-11-26 ENCOUNTER — Encounter: Payer: Self-pay | Admitting: Family Medicine

## 2022-12-30 ENCOUNTER — Other Ambulatory Visit: Payer: Self-pay | Admitting: Family Medicine

## 2022-12-30 DIAGNOSIS — I10 Essential (primary) hypertension: Secondary | ICD-10-CM

## 2023-01-04 ENCOUNTER — Other Ambulatory Visit: Payer: Self-pay | Admitting: Family Medicine

## 2023-01-04 DIAGNOSIS — I10 Essential (primary) hypertension: Secondary | ICD-10-CM

## 2023-01-27 ENCOUNTER — Ambulatory Visit: Payer: 59 | Admitting: Family Medicine

## 2023-01-27 ENCOUNTER — Encounter: Payer: Self-pay | Admitting: Family Medicine

## 2023-01-27 VITALS — BP 136/90 | HR 103 | Temp 98.2°F | Ht 64.0 in | Wt 240.4 lb

## 2023-01-27 DIAGNOSIS — G5622 Lesion of ulnar nerve, left upper limb: Secondary | ICD-10-CM

## 2023-01-27 DIAGNOSIS — N3944 Nocturnal enuresis: Secondary | ICD-10-CM

## 2023-01-27 DIAGNOSIS — I10 Essential (primary) hypertension: Secondary | ICD-10-CM | POA: Diagnosis not present

## 2023-01-27 DIAGNOSIS — G4733 Obstructive sleep apnea (adult) (pediatric): Secondary | ICD-10-CM | POA: Diagnosis not present

## 2023-01-27 DIAGNOSIS — R32 Unspecified urinary incontinence: Secondary | ICD-10-CM | POA: Insufficient documentation

## 2023-01-27 DIAGNOSIS — G5602 Carpal tunnel syndrome, left upper limb: Secondary | ICD-10-CM | POA: Diagnosis not present

## 2023-01-27 MED ORDER — LOSARTAN POTASSIUM 50 MG PO TABS
50.0000 mg | ORAL_TABLET | Freq: Every day | ORAL | 3 refills | Status: DC
Start: 2023-01-27 — End: 2023-03-04

## 2023-01-27 NOTE — Assessment & Plan Note (Signed)
Surgery planned for tomorrow

## 2023-01-27 NOTE — Progress Notes (Signed)
[facility] [address] [place] [address] Dept: [phone] Dept Fax: [phone]  Chronic Care Office Visit  Subjective:    Patient ID: [name], female    DOB: 1970-04-25, 52 y.o..   MRN: [id number]  Chief Complaint  Patient presents with   Hypertension    2 month f/u.  no concerns.    History of Present Illness:  Patient is in today for reassessment of chronic medical issues.  [name] has a history of hypertension, managed with amlodipine 5 mg daily and losartan 50 mg daily. At her last visit, she complained of a tickle sensation in her throat, which she had attributed to her previous lisinopril and now attributed to losartan. I had switched her to metoprolol. She notes she did not like how she felt on the metoprolol, so switched back to losartan. She is no longer having the tickle in her throat.   [name] is currently seeing Dr. Amanda Pea. She notes she is scheduled for a left ulnar nerve release and left CTS surgery tomorrow.  [name] husband, [name], asked to speak to me alone after the visit. He notes that [name] snores very loudly. She has a history of sleep apnea and was previously prescribed a CPAP machine. However, she had trouble using this, so sent it back. He also notes a concern regarding her leaking urine at night. He notes this started after she had a hysterectomy. She had told him her bladder dropped some. He worries about her having a urine odor about her at times.  Past Medical History: Patient Active Problem List   Diagnosis Date Noted   Left carpal tunnel syndrome 01/27/2023   Urinary incontinence 01/27/2023   Nocturnal cough 11/11/2022   Palpitations 11/11/2022   Ganglion cyst of finger of left hand 02/25/2022   Hypertensive retinopathy of right eye 11/19/2021   Open angle with borderline findings and low glaucoma risk in both eyes 11/19/2021   Obstructive sleep apnea 06/10/2021    Degenerative joint disease (DJD) of lumbar spine 04/17/2021   Derangement of meniscus of left knee 04/17/2021   Osteoarthritis of left hip 04/17/2021   Centrilobular emphysema (HCC) 06/29/2020   Aortic atherosclerosis (HCC) 06/29/2020   Mixed hyperlipidemia 10/19/2019   History of diverticulitis 11/05/2018   Entrapment of left ulnar nerve at elbow 10/19/2018   Prediabetes 10/08/2018   Class 2 obesity due to excess calories with body mass index (BMI) of 39.0 to 39.9 in adult 03/29/2018   Nonspecific abnormal electrocardiogram (ECG) (EKG) 03/27/2018   Atypical chest pain 03/27/2018   History of hysterectomy, supracervical 02/14/2017   Trigger ring finger of right hand 08/07/2015   Primary osteoarthritis of first carpometacarpal joint of right hand 08/07/2015   Diverticulitis of sigmoid colon s/p colectomy 06/30/2014 07/26/2014   Hx of adenomatous polyp of colon 12/23/2013   Essential hypertension 11/02/2013   Former smoker 11/26/2008   Past Surgical History:  Procedure Laterality Date   BREAST LUMPECTOMY  11/2009   rt-neg   BREAST SURGERY  2004   fibroademoma   CERVICAL CONE BIOPSY  2000   COLON RESECTION N/A 07/06/2014   Procedure: LAPAROSCOPIC LOOP ILEOSTOMY WITH PLACEMENT OF PELVIC DRAIN;  Surgeon: Manus Krisa Blattner, MD;  Location: [facility];  Service: General;  Laterality: N/A;   COLON SURGERY  10/2014   reversal of ileostomy   COLONOSCOPY  12/2018   GM-MAC-suprep(good)-SSPx 5, end to end colonic anastomosis sigmoidcolon   DILATION AND CURETTAGE OF UTERUS  2009   DIVERTING ILEOSTOMY Right 06/2014   ECTOPIC PREGNANCY SURGERY  2005   R salpyngectomy   ILEOSTOMY N/A 10/26/2014   Procedure: LOOP ILEOSTOMY REVERSAL;  Surgeon: Manus Juliana Boling, MD;  Location: [facility];  Service: General;  Laterality: N/A;   LAPAROSCOPIC SIGMOID COLECTOMY N/A 06/30/2014   Procedure: LAPAROSCOPIC ASSISTED SIGMOID COLECTOMY;  Surgeon: Manus Hussam Muniz, MD;  [facility]: [facility];  Service: General;  Laterality: N/A;    MYOMECTOMY  2008   robotic   POLYPECTOMY  2009   POLYPECTOMY  2020   SSP x 5   ROBOTIC ASSISTED LAP VAGINAL HYSTERECTOMY  2010   supracervical   SALPINGECTOMY Right 2005   ectopic pregnancy-RIGHT tube removed   TRIGGER FINGER RELEASE Right 09/28/2012   Procedure: RELEASE TRIGGER FINGER/A-1 PULLEY RIGHT THUMB;  Surgeon: Jodi Marble, MD;  Location: [facility];  Service: Orthopedics;  Laterality: Right;   ULNAR NERVE TRANSPOSITION Right 12/01/2018   Procedure: ULNAR NERVE DECOMPRESSION;  Surgeon: Cindee Salt, MD;  Location: [facility];  Service: Orthopedics;  Laterality: Right;  AXILLARY   WISDOM TOOTH EXTRACTION  2022   Family History  Problem Relation Age of Onset   Sarcoidosis Mother    Hypertension Mother    Hyperlipidemia Mother    Prostate cancer Father 59       radiation seed   Fibroids Sister    Thyroid disease Maternal Aunt    Heart disease Paternal Uncle    Hypertension Maternal Grandmother    Hypertension Paternal Grandmother    Diabetes Paternal Grandmother    Mental illness Cousin    Colon cancer Neg Hx    Colon polyps Neg Hx    Esophageal cancer Neg Hx    Rectal cancer Neg Hx    Stomach cancer Neg Hx    Sleep apnea Neg Hx    Outpatient Medications Prior to Visit  Medication Sig Dispense Refill   albuterol (VENTOLIN HFA) 108 (90 Base) MCG/ACT inhaler Inhale 1-2 puffs into the lungs every 6 (six) hours as needed for wheezing or shortness of breath. 8 g 0   amLODipine (NORVASC) 5 MG tablet TAKE 1 TABLET BY MOUTH DAILY 90 tablet 3   atorvastatin (LIPITOR) 20 MG tablet TAKE 1 TABLET BY MOUTH DAILY 90 tablet 3   Multiple Vitamins-Minerals (HAIR/SKIN/NAILS/BIOTIN PO) Take 3 tablets by mouth daily.     nitroGLYCERIN (NITROSTAT) 0.4 MG SL tablet Place 1 tablet (0.4 mg total) under the tongue every 5 (five) minutes as needed for up to 25 days for chest pain. 25 tablet 1   Black Cohosh (REMIFEMIN MENOPAUSE PO)  (Patient not taking:  Reported on 01/27/2023)     Black Cohosh 20 MG TABS Take 40 mg by mouth daily.  (Patient not taking: Reported on 01/27/2023)     metoprolol tartrate (LOPRESSOR) 25 MG tablet Take 1 tablet (25 mg total) by mouth 2 (two) times daily. (Patient not taking: Reported on 01/27/2023) 180 tablet 3   lidocaine (HM LIDOCAINE PATCH) 4 % Place 1 patch onto the skin daily. 10 patch 0   No facility-administered medications prior to visit.   Allergies  Allergen Reactions   Keflex [Cephalexin] Anaphylaxis   Penicillins Anaphylaxis    Has patient had a PCN reaction causing immediate rash, facial/tongue/throat swelling, SOB or lightheadedness with hypotension: Yes Has patient had a PCN reaction causing severe rash involving mucus membranes or skin necrosis: No Has patient had a PCN reaction that required hospitalization: No Has patient had a PCN  reaction occurring within the last 10 years: No If all of the above answers are "NO", then may proceed with Cephalosporin use.    Prednisone Other (See Comments)    "It makes me feel like I am out of my body"   Objective:   Today's Vitals   01/27/23 1008  BP: (!) 136/90  Pulse: (!) 103  Temp: 98.2 F (36.8 C)  TempSrc: Temporal  SpO2: 97%  Weight: 240 lb 6.4 oz (109 kg)  Height: 5\' 4"  (1.626 m)   Body mass index is 41.26 kg/m.   General: Well developed, well nourished. No acute distress. Psych: Alert and oriented. Normal mood and affect.  There are no preventive care reminders to display for this patient.    Assessment & Plan:   Problem List Items Addressed This Visit       Cardiovascular and Mediastinum   Essential hypertension - Primary    Blood pressure is mildly high today. I suspect her pending surgery may be part of the issue. I recommend she start checking her BP regularly at home. If the BP remains > 130/80, she should let me know, to consider a dosage change of her meds. Otherwise, continue her amlodipine 5 mg daily and losartan 50 mg  daily.       Relevant Medications   losartan (COZAAR) 50 MG tablet     Respiratory   Obstructive sleep apnea    Ms. Legree husband raised concerns for her snoring, after Ms. Baab left the room. I recommended to him that he encourage his wife to return to her sleep center regarding other options for CPAP.        Nervous and Auditory   Entrapment of left ulnar nerve at elbow    Surgery planned for tomorrow.      Left carpal tunnel syndrome    Surgery planned for tomorrow.        Other   Urinary incontinence    Based on her husband's report, it sound like Ms. Thielen may have a cystocele contributing to urinary incontinence at night. I recommend she see a gynecologist to discuss if she may need an anterior vaginal repair/bladder suspension to stop this issue.       Return in about 3 months (around 04/27/2023) for Reassessment.   Loyola Mast, MD

## 2023-01-27 NOTE — Assessment & Plan Note (Signed)
Blood pressure is mildly high today. I suspect her pending surgery may be part of the issue. I recommend she start checking her BP regularly at home. If the BP remains > 130/80, she should let me know, to consider a dosage change of her meds. Otherwise, continue her amlodipine 5 mg daily and losartan 50 mg daily.

## 2023-01-27 NOTE — Assessment & Plan Note (Signed)
Based on her husband's report, it sound like Ms. Ixta may have a cystocele contributing to urinary incontinence at night. I recommend she see a gynecologist to discuss if she may need an anterior vaginal repair/bladder suspension to stop this issue.

## 2023-01-27 NOTE — Assessment & Plan Note (Signed)
[name] husband raised concerns for her snoring, after [name] left the room. I recommended to him that he encourage his wife to return to her sleep center regarding other options for CPAP.

## 2023-03-04 ENCOUNTER — Other Ambulatory Visit: Payer: Self-pay | Admitting: Family Medicine

## 2023-03-04 DIAGNOSIS — I10 Essential (primary) hypertension: Secondary | ICD-10-CM

## 2023-04-04 ENCOUNTER — Other Ambulatory Visit: Payer: Self-pay | Admitting: Cardiology

## 2023-04-07 NOTE — Telephone Encounter (Signed)
 Pt has not been seen since 2020. Does Dr. Jacinto Halim want to refill or should PCP refill? Please advise.

## 2023-04-07 NOTE — Telephone Encounter (Signed)
 Agree.  She may not even need the medication. I messaged her with that

## 2023-04-08 LAB — HM MAMMOGRAPHY

## 2023-04-10 NOTE — Telephone Encounter (Signed)
 Left message to call office

## 2023-04-11 ENCOUNTER — Emergency Department (HOSPITAL_COMMUNITY)
Admission: EM | Admit: 2023-04-11 | Discharge: 2023-04-11 | Disposition: A | Discharge status: Home / Self Care | Payer: 59 | Attending: Emergency Medicine | Admitting: Emergency Medicine

## 2023-04-11 ENCOUNTER — Emergency Department (HOSPITAL_COMMUNITY): Payer: 59

## 2023-04-11 ENCOUNTER — Other Ambulatory Visit: Payer: Self-pay

## 2023-04-11 DIAGNOSIS — R1032 Left lower quadrant pain: Secondary | ICD-10-CM | POA: Insufficient documentation

## 2023-04-11 DIAGNOSIS — B349 Viral infection, unspecified: Secondary | ICD-10-CM | POA: Diagnosis not present

## 2023-04-11 DIAGNOSIS — Z79899 Other long term (current) drug therapy: Secondary | ICD-10-CM | POA: Diagnosis not present

## 2023-04-11 DIAGNOSIS — R000 Tachycardia, unspecified: Secondary | ICD-10-CM | POA: Diagnosis not present

## 2023-04-11 DIAGNOSIS — I10 Essential (primary) hypertension: Secondary | ICD-10-CM | POA: Diagnosis not present

## 2023-04-11 DIAGNOSIS — R0689 Other abnormalities of breathing: Secondary | ICD-10-CM | POA: Diagnosis not present

## 2023-04-11 DIAGNOSIS — R509 Fever, unspecified: Secondary | ICD-10-CM | POA: Diagnosis present

## 2023-04-11 LAB — COMPREHENSIVE METABOLIC PANEL
ALT: 29 U/L (ref 0–44)
AST: 21 U/L (ref 15–41)
Albumin: 4.3 g/dL (ref 3.5–5.0)
Alkaline Phosphatase: 57 U/L (ref 38–126)
Anion gap: 14 (ref 5–15)
BUN: 9 mg/dL (ref 6–20)
CO2: 23 mmol/L (ref 22–32)
Calcium: 10.1 mg/dL (ref 8.9–10.3)
Chloride: 103 mmol/L (ref 98–111)
Creatinine, Ser: 0.94 mg/dL (ref 0.44–1.00)
GFR, Estimated: >60 mL/min (ref >60)
Glucose, Bld: 101 mg/dL — ABNORMAL HIGH (ref 70–99)
Potassium: 3.8 mmol/L (ref 3.5–5.1)
Sodium: 140 mmol/L (ref 135–145)
Total Bilirubin: 0.7 mg/dL (ref 0.0–1.2)
Total Protein: 7.8 g/dL (ref 6.5–8.1)

## 2023-04-11 LAB — CBC WITH DIFFERENTIAL/PLATELET
Abs Immature Granulocytes: 0.03 10*3/uL (ref 0.00–0.07)
Basophils Absolute: 0.1 10*3/uL (ref 0.0–0.1)
Basophils Relative: 1 %
Eosinophils Absolute: 0.1 10*3/uL (ref 0.0–0.5)
Eosinophils Relative: 1 %
HCT: 42.6 % (ref 36.0–46.0)
Hemoglobin: 14 g/dL (ref 12.0–15.0)
Immature Granulocytes: 0 %
Lymphocytes Relative: 32 %
Lymphs Abs: 3.1 10*3/uL (ref 0.7–4.0)
MCH: 30 pg (ref 26.0–34.0)
MCHC: 32.9 g/dL (ref 30.0–36.0)
MCV: 91.4 fL (ref 80.0–100.0)
Monocytes Absolute: 1.2 10*3/uL — ABNORMAL HIGH (ref 0.1–1.0)
Monocytes Relative: 12 %
Neutro Abs: 5.1 10*3/uL (ref 1.7–7.7)
Neutrophils Relative %: 54 %
Platelets: 321 10*3/uL (ref 150–400)
RBC: 4.66 MIL/uL (ref 3.87–5.11)
RDW: 13.2 % (ref 11.5–15.5)
WBC: 9.5 10*3/uL (ref 4.0–10.5)
nRBC: 0 % (ref 0.0–0.2)

## 2023-04-11 LAB — I-STAT CHEM 8, ED
BUN: 9 mg/dL (ref 6–20)
Calcium, Ion: 1.24 mmol/L (ref 1.15–1.40)
Chloride: 102 mmol/L (ref 98–111)
Creatinine, Ser: 1 mg/dL (ref 0.44–1.00)
Glucose, Bld: 109 mg/dL — ABNORMAL HIGH (ref 70–99)
HCT: 40 % (ref 36.0–46.0)
Hemoglobin: 13.6 g/dL (ref 12.0–15.0)
Potassium: 3.7 mmol/L (ref 3.5–5.1)
Sodium: 140 mmol/L (ref 135–145)
TCO2: 27 mmol/L (ref 22–32)

## 2023-04-11 LAB — URINALYSIS, ROUTINE W REFLEX MICROSCOPIC
Bilirubin Urine: NEGATIVE
Glucose, UA: NEGATIVE mg/dL
Hgb urine dipstick: NEGATIVE
Ketones, ur: NEGATIVE mg/dL
Leukocytes,Ua: NEGATIVE
Nitrite: NEGATIVE
Protein, ur: NEGATIVE mg/dL
Specific Gravity, Urine: 1.026 (ref 1.005–1.030)
pH: 5 (ref 5.0–8.0)

## 2023-04-11 LAB — I-STAT CG4 LACTIC ACID, ED
Lactic Acid, Venous: 2.3 mmol/L (ref 0.5–1.9)
Lactic Acid, Venous: 0.9 mmol/L (ref 0.5–1.9)

## 2023-04-11 LAB — BRAIN NATRIURETIC PEPTIDE: B Natriuretic Peptide: 7.6 pg/mL (ref 0.0–100.0)

## 2023-04-11 LAB — RESP PANEL BY RT-PCR (RSV, FLU A&B, COVID)  RVPGX2
Influenza A by PCR: NEGATIVE
Influenza B by PCR: NEGATIVE
Resp Syncytial Virus by PCR: NEGATIVE
SARS Coronavirus 2 by RT PCR: NEGATIVE

## 2023-04-11 LAB — LIPASE, BLOOD: Lipase: 33 U/L (ref 11–51)

## 2023-04-11 MED ORDER — LACTATED RINGERS IV BOLUS
1000.0000 mL | Freq: Once | INTRAVENOUS | Status: AC
  Administered 2023-04-11: 1000 mL via INTRAVENOUS

## 2023-04-11 MED ORDER — ONDANSETRON HCL 4 MG PO TABS: 4.0000 mg | ORAL_TABLET | Freq: Four times a day (QID) | ORAL | 0 refills | Status: DC

## 2023-04-11 MED ORDER — ACETAMINOPHEN 325 MG PO TABS
650.0000 mg | ORAL_TABLET | Freq: Once | ORAL | Status: AC
  Administered 2023-04-11: 650 mg via ORAL
  Filled 2023-04-11: qty 2

## 2023-04-11 MED ORDER — IOHEXOL 350 MG/ML SOLN
75.0000 mL | Freq: Once | INTRAVENOUS | Status: AC | PRN
  Administered 2023-04-11: 75 mL via INTRAVENOUS

## 2023-04-11 NOTE — ED Triage Notes (Signed)
 Pt. Stated, I woke up this morning with fever, chills, a lot of gas, and left side pain which I have diverticulitis.

## 2023-04-11 NOTE — ED Provider Notes (Signed)
 [place] EMERGENCY DEPARTMENT AT [facility] Provider Note   CSN: [id number] Arrival date & time: 04/11/23  1022     History  Chief Complaint  Patient presents with   Fever   Chills   left side pain    [name] is a 53 y.o. female history of diverticulitis with complications, hypertension, emphysema presenting with fever and left lower quadrant pain that began this morning.  Patient states she had a temperature of 100 F and took Tylenol which helped resolve this.  Patient does have pain in her left lower quadrant but denies constipation diarrhea, hematochezia, melena, dysuria, vaginal bleeding, hematuria.  Patient denies vomiting nausea chest pain shortness of breath.  Patient is concerned that she has a complication with her diverticulitis.  Home Medications Prior to Admission medications   Medication Sig Start Date End Date Taking? Authorizing Provider  ondansetron (ZOFRAN) 4 MG tablet Take 1 tablet (4 mg total) by mouth every 6 (six) hours. 04/11/23  Yes Eldred Sooy, Beverly Gust, PA-C  albuterol (VENTOLIN HFA) 108 (90 Base) MCG/ACT inhaler Inhale 1-2 puffs into the lungs every 6 (six) hours as needed for wheezing or shortness of breath. 10/31/21   Tomi Bamberger, PA-C  amLODipine (NORVASC) 5 MG tablet TAKE 1 TABLET BY MOUTH DAILY 07/22/22   Loyola Mast, MD  atorvastatin (LIPITOR) 20 MG tablet TAKE 1 TABLET BY MOUTH DAILY 07/22/22   Loyola Mast, MD  Black Cohosh (REMIFEMIN MENOPAUSE PO)     [provider]  Black Cohosh 20 MG TABS Take 40 mg by mouth daily.  Patient not taking: Reported on 01/27/2023    [provider]  losartan (COZAAR) 50 MG tablet TAKE 1 TABLET BY MOUTH DAILY 03/04/23   Loyola Mast, MD  Multiple Vitamins-Minerals (HAIR/SKIN/NAILS/BIOTIN PO) Take 3 tablets by mouth daily.    [provider]  nitroGLYCERIN (NITROSTAT) 0.4 MG SL tablet Place 1 tablet (0.4 mg total) under the tongue every 5 (five) minutes as needed for  up to 25 days for chest pain. 02/14/21 01/27/23  Yates Decamp, MD      Allergies    Keflex [cephalexin], Penicillins, and Prednisone    Review of Systems   Review of Systems  Constitutional:  Positive for fever.    Physical Exam Updated Vital Signs BP 120/76 (BP Location: Right Arm)   Pulse 82   Temp 98.3 F (36.8 C) (Oral)   Resp 18   Ht 5\' 4"  (1.626 m)   Wt 107.5 kg   SpO2 100%   BMI 40.68 kg/m  Physical Exam Vitals reviewed.  Constitutional:      General: She is not in acute distress. HENT:     Head: Normocephalic and atraumatic.  Eyes:     Extraocular Movements: Extraocular movements intact.     Conjunctiva/sclera: Conjunctivae normal.     Pupils: Pupils are equal, round, and reactive to light.  Cardiovascular:     Rate and Rhythm: Regular rhythm. Tachycardia present.     Pulses: Normal pulses.     Heart sounds: Normal heart sounds.     Comments: 2+ bilateral radial/dorsalis pedis pulses with regular rate Pulmonary:     Effort: Pulmonary effort is normal. No respiratory distress.     Breath sounds: Normal breath sounds.  Abdominal:     Palpations: Abdomen is soft.     Tenderness: There is abdominal tenderness (Left lower quadrant). There is no guarding or rebound.  Musculoskeletal:  General: Normal range of motion.     Cervical back: Normal range of motion and neck supple.     Comments: 5 out of 5 bilateral grip/leg extension strength  Skin:    General: Skin is warm and dry.     Capillary Refill: Capillary refill takes less than 2 seconds.  Neurological:     General: No focal deficit present.     Mental Status: She is alert and oriented to person, place, and time.     Comments: Sensation intact in all 4 limbs  Psychiatric:        Mood and Affect: Mood normal.     ED Results / Procedures / Treatments   Labs (all labs ordered are listed, but only abnormal results are displayed) Labs Reviewed  CBC WITH DIFFERENTIAL/PLATELET - Abnormal; Notable for  the following components:      Result Value   Monocytes Absolute 1.2 (*)    All other components within normal limits  URINALYSIS, ROUTINE W REFLEX MICROSCOPIC - Abnormal; Notable for the following components:   Color, Urine AMBER (*)    APPearance HAZY (*)    All other components within normal limits  COMPREHENSIVE METABOLIC PANEL - Abnormal; Notable for the following components:   Glucose, Bld 101 (*)    All other components within normal limits  I-STAT CG4 LACTIC ACID, ED - Abnormal; Notable for the following components:   Lactic Acid, Venous 2.3 (*)    All other components within normal limits  I-STAT CHEM 8, ED - Abnormal; Notable for the following components:   Glucose, Bld 109 (*)    All other components within normal limits  RESP PANEL BY RT-PCR (RSV, FLU A&B, COVID)  RVPGX2  BRAIN NATRIURETIC PEPTIDE  LIPASE, BLOOD  I-STAT CG4 LACTIC ACID, ED    EKG EKG Interpretation Date/Time:  Friday April 11 2023 10:40:51 EST Ventricular Rate:  107 PR Interval:  124 QRS Duration:  80 QT Interval:  348 QTC Calculation: 464 R Axis:   75  Text Interpretation: Sinus tachycardia Otherwise normal ECG When compared with ECG of 07-Jul-2022 05:31, PREVIOUS ECG IS PRESENT when compared to prior, faster rate. No STEMI Confirmed by Theda Belfast ([id number]) on 04/11/2023 12:17:18 PM  Radiology CT ABDOMEN PELVIS W CONTRAST Result Date: 04/11/2023 CLINICAL DATA:  Left-sided abdominal pain with fevers and chills. EXAM: CT ABDOMEN AND PELVIS WITH CONTRAST TECHNIQUE: Multidetector CT imaging of the abdomen and pelvis was performed using the standard protocol following bolus administration of intravenous contrast. RADIATION DOSE REDUCTION: This exam was performed according to the departmental dose-optimization program which includes automated exposure control, adjustment of the mA and/or kV according to patient size and/or use of iterative reconstruction technique. CONTRAST:  75mL OMNIPAQUE IOHEXOL 350  MG/ML SOLN COMPARISON:  CT abdomen pelvis dated Jul 07, 2022. FINDINGS: Lower chest: No acute abnormality.  Centrilobular emphysema. Hepatobiliary: Diffusely decreased liver density without focal abnormality. Normal gallbladder. No biliary dilatation. Pancreas: Unremarkable. No pancreatic ductal dilatation or surrounding inflammatory changes. Spleen: Normal in size without focal abnormality. Adrenals/Urinary Tract: Adrenal glands are unremarkable. Kidneys are normal, without renal calculi, focal lesion, or hydronephrosis. Bladder is unremarkable. Stomach/Bowel: The stomach is within normal limits. Postsurgical changes of the distal ileum and sigmoid colon related to prior partial sigmoid colon resection with diverting ileostomy. Left-sided colonic diverticulosis. No bowel wall thickening, distention, or surrounding inflammatory changes. Normal appendix. Vascular/Lymphatic: Aortic atherosclerosis. No enlarged abdominal or pelvic lymph nodes. Reproductive: Status post hysterectomy. No adnexal masses. Other: No free fluid  or pneumoperitoneum. Unchanged diastasis of the ventral abdominal wall. Unchanged small fat containing hernia defect in the right anterior abdominal wall at the site of prior ileostomy. Musculoskeletal: No acute or significant osseous findings. IMPRESSION: 1. No acute intra-abdominal process. 2. Hepatic steatosis. 3. Aortic Atherosclerosis (ICD10-I70.0) and Emphysema (ICD10-J43.9). Electronically Signed   By: Obie Dredge M.D.   On: 04/11/2023 16:50    Procedures Procedures    Medications Ordered in ED Medications  lactated ringers bolus 1,000 mL (0 mLs Intravenous Stopped 04/11/23 1815)  acetaminophen (TYLENOL) tablet 650 mg (650 mg Oral Given 04/11/23 1321)  iohexol (OMNIPAQUE) 350 MG/ML injection 75 mL (75 mLs Intravenous Contrast Given 04/11/23 1607)    ED Course/ Medical Decision Making/ A&P                                 Medical Decision Making Amount and/or Complexity of  Data Reviewed Labs: ordered. Radiology: ordered.  Risk OTC drugs. Prescription drug management.   [name] 53 y.o. presented today for abdominal pain.  Working DDx that I considered at this time includes, but not limited to, gastroenteritis, colitis, small bowel obstruction, appendicitis, cholecystitis, hepatobiliary pathology, gastritis, PUD, ACS, aortic dissection, diverticulosis/diverticulitis, pancreatitis, nephrolithiasis, medication induced, AAA, UTI, pyelonephritis, ruptured ectopic pregnancy, PID, ovarian torsion.  R/o DDx: gastroenteritis, colitis, small bowel obstruction, appendicitis, cholecystitis, hepatobiliary pathology, gastritis, PUD, ACS, aortic dissection, diverticulosis/diverticulitis, pancreatitis, nephrolithiasis, medication induced, AAA, UTI, pyelonephritis, ruptured ectopic pregnancy, PID, ovarian torsion: These are considered less likely due to history of present illness, physical exam, labs/imaging findings.  Review of prior external notes: 01/27/2023 office visit  Unique Tests and My Independent Interpretation:  CBC with differential: Unremarkable I-STAT Chem-8: Unremarkable CMP: Unremarkable Lipase: Unremarkable UA: Unremarkable CT Abd/Pelvis with contrast: No acute abdominal pathology  Social Determinants of Health: none  Discussion with Independent Historian: None  Discussion of Management of Tests: None  Risk: Medium: prescription drug management  Risk Stratification Score: None  Plan: On exam patient was no acute distress was noted be tachycardic on exam.  Patient also has left lower quadrant tenderness without peritoneal signs.  Patient is endorsing a fever at home and is borderline here at 99.1 F and requesting pain meds until start with Tylenol.  Patient's labs in triage thus far look reassuring however will get lactic acid and after shared decision making we will get CT scan to rule out any complications of her diverticulitis patient  does have complicated diverticulitis history.  Will give fluids as well.  Patient's lactic acid did come back at 2.3.  Patient is already receiving fluids and so we will repeat this.  On reevaluation patient was no longer tachycardic as they were 96 bpm and feel much better after the fluids and Tylenol.  Will hold off on sepsis as patient does not have white count and is not febrile and has improved with fluids.  Rest of labs are reassuring.  Patient was successfully p.o. challenged and safe to be discharged.  Will have her follow-up with her primary care provider.  Encouraged Tylenol every 6 hours needed pain and remain hydrated food as tolerated.  Will prescribe Zofran in case she has any nausea at home.  Patient was given return precautions. Patient stable for discharge at this time.  Patient verbalized understanding of plan.  This chart was dictated using voice recognition software.  Despite best efforts to proofread,  errors can occur which can change the  documentation meaning.         Final Clinical Impression(s) / ED Diagnoses Final diagnoses:  Viral illness    Rx / DC Orders ED Discharge Orders          Ordered    ondansetron (ZOFRAN) 4 MG tablet  Every 6 hours        04/11/23 1751              Remi Deter 04/11/23 1840    Tegeler, Canary Brim, MD 04/12/23 [phone]

## 2023-04-11 NOTE — Discharge Instructions (Addendum)
 Please follow-up with your primary care provider regards recent ER visit. Today your labs and imaging are reassuring most likely have a viral illness. Please use Tylenol every 6 hours as needed for pain. I have prescribed Zofran in case you become nauseous. Please remain hydrated and eat food as tolerated. If symptoms change or worsen please return to the ER.

## 2023-04-14 ENCOUNTER — Telehealth: Payer: Self-pay

## 2023-04-14 NOTE — Transitions of Care (Post Inpatient/ED Visit) (Signed)
   04/14/2023  Name: [name] MRN: [id number] DOB: August 14, 1970  Today's TOC FU Call Status: Today's TOC FU Call Status:: Unsuccessful Call (1st Attempt) Unsuccessful Call (1st Attempt) Date: 04/14/23  Attempted to reach the patient regarding the most recent Inpatient/ED visit.  Follow Up Plan: Additional outreach attempts will be made to reach the patient to complete the Transitions of Care (Post Inpatient/ED visit) call.   Signature Jenny Reichmann

## 2023-04-15 NOTE — Transitions of Care (Post Inpatient/ED Visit) (Signed)
   04/15/2023  Name: [name] MRN: [id number] DOB: 1970-12-13  Today's TOC FU Call Status: Today's TOC FU Call Status:: Successful TOC FU Call Completed Unsuccessful Call (1st Attempt) Date: 04/14/23 Patient's Name and Date of Birth confirmed.  Transition Care Management Follow-up Telephone Call Date of Discharge: 04/11/23 Discharge Facility: [facility]) Type of Discharge: Emergency Department How have you been since you were released from the hospital?: Better Any questions or concerns?: No  Items Reviewed: Did you receive and understand the discharge instructions provided?: Yes Medications obtained,verified, and reconciled?: Partial Review Completed Any new allergies since your discharge?: No Dietary orders reviewed?: NA Do you have support at home?: Yes People in Home: spouse  Medications Reviewed Today: Medications Reviewed Today     Reviewed by Trudee Kuster, CMA (Certified Medical Assistant) on 04/15/23 at 0901  Med List Status: <None>   Medication Order Taking? Sig Documenting Provider Last Dose Status Informant  albuterol (VENTOLIN HFA) 108 (90 Base) MCG/ACT inhaler [id number] Yes Inhale 1-2 puffs into the lungs every 6 (six) hours as needed for wheezing or shortness of breath. Tomi Bamberger, PA-C Taking Active   amLODipine (NORVASC) 5 MG tablet [id number] Yes TAKE 1 TABLET BY MOUTH DAILY Loyola Mast, MD Taking Active   atorvastatin (LIPITOR) 20 MG tablet [id number] Yes TAKE 1 TABLET BY MOUTH DAILY Loyola Mast, MD Taking Active   Black Cohosh (REMIFEMIN MENOPAUSE PO) [id number] No   Patient not taking: Reported on 04/15/2023   [provider] Not Taking Active   Black Cohosh 20 MG TABS [id number] No Take 40 mg by mouth daily.   Patient not taking: Reported on 01/27/2023   [provider] Not Taking Active Self           Med Note Beverly Gust Feb 13, 2015  4:02 PM)    losartan (COZAAR) 50 MG tablet [id number] Yes TAKE 1  TABLET BY MOUTH DAILY Loyola Mast, MD Taking Active   Multiple Vitamins-Minerals (HAIR/SKIN/NAILS/BIOTIN PO) [id number] Yes Take 3 tablets by mouth daily. [provider] Taking Active Self  nitroGLYCERIN (NITROSTAT) 0.4 MG SL tablet [id number]  Place 1 tablet (0.4 mg total) under the tongue every 5 (five) minutes as needed for up to 25 days for chest pain. Yates Decamp, MD  Expired 01/27/23 2359 Self  ondansetron (ZOFRAN) 4 MG tablet [id number] Yes Take 1 tablet (4 mg total) by mouth every 6 (six) hours. Netta Corrigan, PA-C Taking Active             Home Care and Equipment/Supplies: Were Home Health Services Ordered?: NA Any new equipment or medical supplies ordered?: NA  Functional Questionnaire: Do you need assistance with bathing/showering or dressing?: No Do you need assistance with meal preparation?: No Do you need assistance with eating?: No Do you have difficulty maintaining continence: No Do you need assistance with getting out of bed/getting out of a chair/moving?: No Do you have difficulty managing or taking your medications?: No  Follow up appointments reviewed: PCP Follow-up appointment confirmed?: No Specialist Hospital Follow-up appointment confirmed?: NA Do you need transportation to your follow-up appointment?: No Do you understand care options if your condition(s) worsen?: Yes-patient verbalized understanding    Derenda Fennel, RMA

## 2023-04-17 NOTE — Telephone Encounter (Signed)
Please sent to patient's PCP 

## 2023-04-27 ENCOUNTER — Emergency Department (HOSPITAL_COMMUNITY)

## 2023-04-27 ENCOUNTER — Other Ambulatory Visit: Payer: Self-pay

## 2023-04-27 ENCOUNTER — Inpatient Hospital Stay (HOSPITAL_COMMUNITY)
Admission: EM | Admit: 2023-04-27 | Discharge: 2023-04-29 | DRG: 683 | Disposition: A | Discharge status: Home / Self Care | Attending: Student | Admitting: Student

## 2023-04-27 ENCOUNTER — Encounter (HOSPITAL_COMMUNITY): Payer: Self-pay | Admitting: Emergency Medicine

## 2023-04-27 DIAGNOSIS — Z1624 Resistance to multiple antibiotics: Secondary | ICD-10-CM | POA: Diagnosis present

## 2023-04-27 DIAGNOSIS — I10 Essential (primary) hypertension: Secondary | ICD-10-CM | POA: Diagnosis present

## 2023-04-27 DIAGNOSIS — E860 Dehydration: Secondary | ICD-10-CM | POA: Diagnosis present

## 2023-04-27 DIAGNOSIS — Z8759 Personal history of other complications of pregnancy, childbirth and the puerperium: Secondary | ICD-10-CM

## 2023-04-27 DIAGNOSIS — K219 Gastro-esophageal reflux disease without esophagitis: Secondary | ICD-10-CM | POA: Diagnosis present

## 2023-04-27 DIAGNOSIS — R011 Cardiac murmur, unspecified: Secondary | ICD-10-CM | POA: Diagnosis present

## 2023-04-27 DIAGNOSIS — Z860101 Personal history of adenomatous and serrated colon polyps: Secondary | ICD-10-CM

## 2023-04-27 DIAGNOSIS — Z6841 Body Mass Index (BMI) 40.0 and over, adult: Secondary | ICD-10-CM

## 2023-04-27 DIAGNOSIS — N390 Urinary tract infection, site not specified: Secondary | ICD-10-CM | POA: Diagnosis present

## 2023-04-27 DIAGNOSIS — R112 Nausea with vomiting, unspecified: Secondary | ICD-10-CM | POA: Diagnosis present

## 2023-04-27 DIAGNOSIS — R109 Unspecified abdominal pain: Secondary | ICD-10-CM | POA: Diagnosis not present

## 2023-04-27 DIAGNOSIS — R000 Tachycardia, unspecified: Secondary | ICD-10-CM | POA: Diagnosis present

## 2023-04-27 DIAGNOSIS — K529 Noninfective gastroenteritis and colitis, unspecified: Secondary | ICD-10-CM | POA: Diagnosis present

## 2023-04-27 DIAGNOSIS — Z888 Allergy status to other drugs, medicaments and biological substances status: Secondary | ICD-10-CM

## 2023-04-27 DIAGNOSIS — Z90710 Acquired absence of both cervix and uterus: Secondary | ICD-10-CM

## 2023-04-27 DIAGNOSIS — E876 Hypokalemia: Secondary | ICD-10-CM | POA: Diagnosis present

## 2023-04-27 DIAGNOSIS — R1010 Upper abdominal pain, unspecified: Secondary | ICD-10-CM

## 2023-04-27 DIAGNOSIS — E66812 Obesity, class 2: Secondary | ICD-10-CM

## 2023-04-27 DIAGNOSIS — Z8619 Personal history of other infectious and parasitic diseases: Secondary | ICD-10-CM

## 2023-04-27 DIAGNOSIS — E785 Hyperlipidemia, unspecified: Secondary | ICD-10-CM | POA: Diagnosis present

## 2023-04-27 DIAGNOSIS — Z1152 Encounter for screening for COVID-19: Secondary | ICD-10-CM

## 2023-04-27 DIAGNOSIS — Z79899 Other long term (current) drug therapy: Secondary | ICD-10-CM

## 2023-04-27 DIAGNOSIS — G4733 Obstructive sleep apnea (adult) (pediatric): Secondary | ICD-10-CM | POA: Diagnosis present

## 2023-04-27 DIAGNOSIS — Z880 Allergy status to penicillin: Secondary | ICD-10-CM

## 2023-04-27 DIAGNOSIS — Z8249 Family history of ischemic heart disease and other diseases of the circulatory system: Secondary | ICD-10-CM

## 2023-04-27 DIAGNOSIS — H409 Unspecified glaucoma: Secondary | ICD-10-CM | POA: Diagnosis present

## 2023-04-27 DIAGNOSIS — M1712 Unilateral primary osteoarthritis, left knee: Secondary | ICD-10-CM | POA: Diagnosis present

## 2023-04-27 DIAGNOSIS — N179 Acute kidney failure, unspecified: Secondary | ICD-10-CM | POA: Diagnosis not present

## 2023-04-27 DIAGNOSIS — E66813 Obesity, class 3: Secondary | ICD-10-CM | POA: Diagnosis present

## 2023-04-27 DIAGNOSIS — B9620 Unspecified Escherichia coli [E. coli] as the cause of diseases classified elsewhere: Secondary | ICD-10-CM | POA: Diagnosis present

## 2023-04-27 DIAGNOSIS — Z881 Allergy status to other antibiotic agents status: Secondary | ICD-10-CM

## 2023-04-27 DIAGNOSIS — J439 Emphysema, unspecified: Secondary | ICD-10-CM | POA: Diagnosis present

## 2023-04-27 DIAGNOSIS — R7303 Prediabetes: Secondary | ICD-10-CM | POA: Diagnosis present

## 2023-04-27 DIAGNOSIS — R7989 Other specified abnormal findings of blood chemistry: Secondary | ICD-10-CM | POA: Diagnosis present

## 2023-04-27 DIAGNOSIS — Z833 Family history of diabetes mellitus: Secondary | ICD-10-CM

## 2023-04-27 DIAGNOSIS — Z87891 Personal history of nicotine dependence: Secondary | ICD-10-CM

## 2023-04-27 DIAGNOSIS — Z83438 Family history of other disorder of lipoprotein metabolism and other lipidemia: Secondary | ICD-10-CM

## 2023-04-27 LAB — RESP PANEL BY RT-PCR (RSV, FLU A&B, COVID)  RVPGX2
Influenza A by PCR: NEGATIVE
Influenza B by PCR: NEGATIVE
Resp Syncytial Virus by PCR: NEGATIVE
SARS Coronavirus 2 by RT PCR: NEGATIVE

## 2023-04-27 LAB — COMPREHENSIVE METABOLIC PANEL
ALT: 65 U/L — ABNORMAL HIGH (ref 0–44)
AST: 51 U/L — ABNORMAL HIGH (ref 15–41)
Albumin: 4.1 g/dL (ref 3.5–5.0)
Alkaline Phosphatase: 47 U/L (ref 38–126)
Anion gap: 16 — ABNORMAL HIGH (ref 5–15)
BUN: 20 mg/dL (ref 6–20)
CO2: 23 mmol/L (ref 22–32)
Calcium: 9.8 mg/dL (ref 8.9–10.3)
Chloride: 101 mmol/L (ref 98–111)
Creatinine, Ser: 3.37 mg/dL — ABNORMAL HIGH (ref 0.44–1.00)
GFR, Estimated: 16 mL/min — ABNORMAL LOW (ref >60)
Glucose, Bld: 128 mg/dL — ABNORMAL HIGH (ref 70–99)
Potassium: 3.5 mmol/L (ref 3.5–5.1)
Sodium: 140 mmol/L (ref 135–145)
Total Bilirubin: 1.2 mg/dL (ref 0.0–1.2)
Total Protein: 7.3 g/dL (ref 6.5–8.1)

## 2023-04-27 LAB — CBC
HCT: 41.8 % (ref 36.0–46.0)
Hemoglobin: 13.9 g/dL (ref 12.0–15.0)
MCH: 30 pg (ref 26.0–34.0)
MCHC: 33.3 g/dL (ref 30.0–36.0)
MCV: 90.3 fL (ref 80.0–100.0)
Platelets: 325 10*3/uL (ref 150–400)
RBC: 4.63 MIL/uL (ref 3.87–5.11)
RDW: 13.2 % (ref 11.5–15.5)
WBC: 9.9 10*3/uL (ref 4.0–10.5)
nRBC: 0 % (ref 0.0–0.2)

## 2023-04-27 LAB — HIV ANTIBODY (ROUTINE TESTING W REFLEX): HIV Screen 4th Generation wRfx: NONREACTIVE

## 2023-04-27 LAB — HEPATITIS PANEL, ACUTE
HCV Ab: NONREACTIVE
Hep A IgM: NONREACTIVE
Hep B C IgM: NONREACTIVE
Hepatitis B Surface Ag: NONREACTIVE

## 2023-04-27 LAB — URINALYSIS, ROUTINE W REFLEX MICROSCOPIC
Bilirubin Urine: NEGATIVE
Glucose, UA: NEGATIVE mg/dL
Hgb urine dipstick: NEGATIVE
Ketones, ur: NEGATIVE mg/dL
Nitrite: NEGATIVE
Protein, ur: 30 mg/dL — AB
Specific Gravity, Urine: 1.009 (ref 1.005–1.030)
pH: 5 (ref 5.0–8.0)

## 2023-04-27 LAB — LACTIC ACID, PLASMA
Lactic Acid, Venous: 1 mmol/L (ref 0.5–1.9)
Lactic Acid, Venous: 2 mmol/L (ref 0.5–1.9)

## 2023-04-27 LAB — LIPASE, BLOOD: Lipase: 36 U/L (ref 11–51)

## 2023-04-27 LAB — MAGNESIUM: Magnesium: 2.3 mg/dL (ref 1.7–2.4)

## 2023-04-27 LAB — CK: Total CK: 100 U/L (ref 38–234)

## 2023-04-27 LAB — ETHANOL: Alcohol, Ethyl (B): <10 mg/dL (ref <10)

## 2023-04-27 MED ORDER — PANTOPRAZOLE SODIUM 40 MG IV SOLR
40.0000 mg | INTRAVENOUS | Status: DC
  Administered 2023-04-27: 40 mg via INTRAVENOUS
  Filled 2023-04-27: qty 10

## 2023-04-27 MED ORDER — PANTOPRAZOLE SODIUM 40 MG IV SOLR
40.0000 mg | Freq: Two times a day (BID) | INTRAVENOUS | Status: DC
  Administered 2023-04-27 – 2023-04-29 (×4): 40 mg via INTRAVENOUS
  Filled 2023-04-27 (×4): qty 10

## 2023-04-27 MED ORDER — SODIUM CHLORIDE 0.9% FLUSH
3.0000 mL | Freq: Two times a day (BID) | INTRAVENOUS | Status: DC
  Administered 2023-04-27 – 2023-04-29 (×5): 3 mL via INTRAVENOUS

## 2023-04-27 MED ORDER — SODIUM CHLORIDE 0.9 % IV SOLN: INTRAVENOUS | Status: AC

## 2023-04-27 MED ORDER — LACTATED RINGERS IV BOLUS
2000.0000 mL | Freq: Once | INTRAVENOUS | Status: AC
  Administered 2023-04-27: 2000 mL via INTRAVENOUS

## 2023-04-27 MED ORDER — HEPARIN SODIUM (PORCINE) 5000 UNIT/ML IJ SOLN
5000.0000 [IU] | Freq: Two times a day (BID) | INTRAMUSCULAR | Status: DC
  Administered 2023-04-27 – 2023-04-29 (×4): 5000 [IU] via SUBCUTANEOUS
  Filled 2023-04-27 (×5): qty 1

## 2023-04-27 MED ORDER — HYDROMORPHONE HCL 1 MG/ML IJ SOLN
1.0000 mg | Freq: Two times a day (BID) | INTRAMUSCULAR | Status: AC
  Administered 2023-04-28: 1 mg via INTRAVENOUS
  Filled 2023-04-27 (×2): qty 1

## 2023-04-27 MED ORDER — SODIUM CHLORIDE 0.9 % IV SOLN
8.0000 mg | Freq: Three times a day (TID) | INTRAVENOUS | Status: DC | PRN
  Administered 2023-04-27: 8 mg via INTRAVENOUS
  Filled 2023-04-27: qty 4

## 2023-04-27 MED ORDER — ACETAMINOPHEN 650 MG RE SUPP: 650.0000 mg | Freq: Four times a day (QID) | RECTAL | Status: DC | PRN

## 2023-04-27 MED ORDER — MORPHINE SULFATE (PF) 2 MG/ML IV SOLN
2.0000 mg | INTRAVENOUS | Status: AC | PRN
  Administered 2023-04-28: 2 mg via INTRAVENOUS
  Filled 2023-04-27: qty 1

## 2023-04-27 MED ORDER — MORPHINE SULFATE (PF) 2 MG/ML IV SOLN: 2.0000 mg | INTRAVENOUS | Status: DC | PRN

## 2023-04-27 MED ORDER — CIPROFLOXACIN IN D5W 400 MG/200ML IV SOLN
400.0000 mg | Freq: Once | INTRAVENOUS | Status: AC
Start: 2023-04-27 — End: 2023-04-27
  Administered 2023-04-27: 400 mg via INTRAVENOUS
  Filled 2023-04-27: qty 200

## 2023-04-27 MED ORDER — HYDROMORPHONE HCL 2 MG PO TABS: 1.0000 mg | ORAL_TABLET | Freq: Once | ORAL | Status: DC

## 2023-04-27 MED ORDER — ACETAMINOPHEN 325 MG PO TABS: 650.0000 mg | ORAL_TABLET | Freq: Four times a day (QID) | ORAL | Status: DC | PRN

## 2023-04-27 MED ORDER — HYDROMORPHONE HCL 1 MG/ML IJ SOLN: 1.0000 mg | Freq: Two times a day (BID) | INTRAMUSCULAR | Status: DC

## 2023-04-27 MED ORDER — HYDROMORPHONE HCL 2 MG PO TABS
1.0000 mg | ORAL_TABLET | Freq: Once | ORAL | Status: AC
  Administered 2023-04-27: 1 mg via ORAL
  Filled 2023-04-27: qty 1

## 2023-04-27 MED ORDER — FENTANYL CITRATE PF 50 MCG/ML IJ SOSY
50.0000 ug | PREFILLED_SYRINGE | Freq: Once | INTRAMUSCULAR | Status: AC
  Administered 2023-04-27: 50 ug via INTRAVENOUS
  Filled 2023-04-27: qty 1

## 2023-04-27 NOTE — ED Triage Notes (Addendum)
 Patient reports abdominal pain with n/v/d since yesterday morning. Reports hx of diverticulitis. Also reports fasting for a week and unsure if this is related to the pain. Fevers at home - TMAX 100.5

## 2023-04-27 NOTE — H&P (Signed)
 History and Physical    Patient: [name] WUJ:[id number] DOB: 29-Dec-1970 DOA: 04/27/2023 DOS: the patient was seen and examined on 04/27/2023 PCP: Loyola Mast, MD  Patient coming from: Home Chief complaint: Chief Complaint  Patient presents with   Abdominal Pain   HPI:  [name] is a 53 y.o. female with past medical history  of allergies to Keflex penicillin and prednisone,diverticulitis, essential hypertension, prediabetes, emphysema, glaucoma, obesity presenting with abdominal pain and nausea vomiting diarrhea fevers.  Patient states all her symptoms started today.  Denies any other complaints chest pain shortness of breath palpitations headaches blurred vision or falls.  >>ED Course: Vital signs in the ED were notable for the following:  Vitals are stable initially had tachycardia at 114 and low blood pressure on presentation at 1 1/76 O2 sats 95% on room air.  Labs were notable for the following:  Metabolic panel showing glucose of 128 acute kidney injury with a creatinine of 3.37 and GFR of 16, anion gap of 16, elevated LFTs with AST at 51 and ALT at 65. CBC is within normal limits. Urinalysis shows small leukocytes and protein and 1-50 WBCs with many bacteria.  EKG: Independently reviewed ordered /pending.  Imaging and additional notable ED work-up:  CT of the abdomen noncontrast negative for any nephrolithiasis hydronephrosis or any acute findings, shows colonic diverticulosis and hepatic steatosis  While in the ED, the following were administered:  Medications  pantoprazole (PROTONIX) injection 40 mg (has no administration in time range)  lactated ringers bolus 2,000 mL (0 mLs Intravenous Stopped 04/27/23 1249)  ciprofloxacin (CIPRO) IVPB 400 mg (0 mg Intravenous Stopped 04/27/23 1249)  fentaNYL (SUBLIMAZE) injection 50 mcg (50 mcg Intravenous Given 04/27/23 1252)    Review of Systems  Gastrointestinal:  Positive for abdominal pain, diarrhea, nausea and  vomiting.  All other systems reviewed and are negative.  Past Medical History:  Diagnosis Date   Abnormal Pap smear 2000   [facility] Biopsy   Acute respiratory failure with hypoxia (HCC) 10/19/2019   Allergy    Arthritis    LEFT knee   Back pain    Blood transfusion without reported diagnosis 2005   Breast fibroadenoma    Diverticulitis of colon with perforation 05/20/2014   GERD (gastroesophageal reflux disease)    H/O metrorrhagia 12/2006   H/O myomectomy 07/2006   robotic   H/O sinusitis    Headache(784.0)    Heart murmur    dx age [age]- no issues currently   History of conization of cervix 2000   History of ectopic pregnancy 2005   r salpyngectomy   History of hysterectomy, supracervical 08/2008   robotic   History of syphilis    Hx of adenomatous polyp of colon 12/31/2017   Hx of herpes simplex type 2 infection    Hyperlipidemia    on meds   Hypertension    on meds   Ileostomy present (HCC) 10/26/2014   reversed in 10/2014   Intra-abdominal abscess (HCC) 07/21/2014   PMS (premenstrual syndrome)    Seasonal allergies    Sleep apnea    does not use CPAP   Past Surgical History:  Procedure Laterality Date   BREAST LUMPECTOMY  11/2009   rt-neg   BREAST SURGERY  2004   fibroademoma   CERVICAL CONE BIOPSY  2000   COLON RESECTION N/A 07/06/2014   Procedure: LAPAROSCOPIC LOOP ILEOSTOMY WITH PLACEMENT OF PELVIC DRAIN;  Surgeon: Manus Rudd, MD;  Location: [facility] OR;  Service: General;  Laterality: N/A;   COLON SURGERY  10/2014   reversal of ileostomy   COLONOSCOPY  12/2018   GM-MAC-suprep(good)-SSPx 5, end to end colonic anastomosis sigmoidcolon   DILATION AND CURETTAGE OF UTERUS  2009   DIVERTING ILEOSTOMY Right 06/2014   ECTOPIC PREGNANCY SURGERY  2005   R salpyngectomy   ILEOSTOMY N/A 10/26/2014   Procedure: LOOP ILEOSTOMY REVERSAL;  Surgeon: Manus Rudd, MD;  Location: [facility] OR;  Service: General;  Laterality: N/A;   LAPAROSCOPIC SIGMOID COLECTOMY N/A 06/30/2014    Procedure: LAPAROSCOPIC ASSISTED SIGMOID COLECTOMY;  Surgeon: Manus Rudd, MD;  Location: [facility] OR;  Service: General;  Laterality: N/A;   MYOMECTOMY  2008   robotic   POLYPECTOMY  2009   POLYPECTOMY  2020   SSP x 5   ROBOTIC ASSISTED LAP VAGINAL HYSTERECTOMY  2010   supracervical   SALPINGECTOMY Right 2005   ectopic pregnancy-RIGHT tube removed   TRIGGER FINGER RELEASE Right 09/28/2012   Procedure: RELEASE TRIGGER FINGER/A-1 PULLEY RIGHT THUMB;  Surgeon: Jodi Marble, MD;  Location: [facility];  Service: Orthopedics;  Laterality: Right;   ULNAR NERVE TRANSPOSITION Right 12/01/2018   Procedure: ULNAR NERVE DECOMPRESSION;  Surgeon: Cindee Salt, MD;  Location: [facility];  Service: Orthopedics;  Laterality: Right;  AXILLARY   WISDOM TOOTH EXTRACTION  2022    reports that she quit smoking about 11 years ago. Her smoking use included cigarettes. She started smoking about 31 years ago. She has a 10 pack-year smoking history. She has never used smokeless tobacco. She reports that she does not currently use alcohol. She reports that she does not use drugs.  Allergies  Allergen Reactions   Keflex [Cephalexin] Anaphylaxis   Penicillins Anaphylaxis    Has patient had a PCN reaction causing immediate rash, facial/tongue/throat swelling, SOB or lightheadedness with hypotension: Yes Has patient had a PCN reaction causing severe rash involving mucus membranes or skin necrosis: No Has patient had a PCN reaction that required hospitalization: No Has patient had a PCN reaction occurring within the last 10 years: No If all of the above answers are "NO", then may proceed with Cephalosporin use.    Prednisone Other (See Comments)    "It makes me feel like I am out of my body"    Family History  Problem Relation Age of Onset   Sarcoidosis Mother    Hypertension Mother    Hyperlipidemia Mother    Prostate cancer Father 59       radiation seed   Fibroids Sister     Thyroid disease Maternal Aunt    Heart disease Paternal Uncle    Hypertension Maternal Grandmother    Hypertension Paternal Grandmother    Diabetes Paternal Grandmother    Mental illness Cousin    Colon cancer Neg Hx    Colon polyps Neg Hx    Esophageal cancer Neg Hx    Rectal cancer Neg Hx    Stomach cancer Neg Hx    Sleep apnea Neg Hx     Prior to Admission medications   Medication Sig Start Date End Date Taking? Authorizing Provider  albuterol (VENTOLIN HFA) 108 (90 Base) MCG/ACT inhaler Inhale 1-2 puffs into the lungs every 6 (six) hours as needed for wheezing or shortness of breath. 10/31/21   Tomi Bamberger, PA-C  amLODipine (NORVASC) 5 MG tablet TAKE 1 TABLET BY MOUTH DAILY 07/22/22   Loyola Mast, MD  atorvastatin (LIPITOR) 20 MG tablet TAKE 1 TABLET BY MOUTH DAILY  07/22/22   Loyola Mast, MD  Black Cohosh (REMIFEMIN MENOPAUSE PO)     [provider]  Black Cohosh 20 MG TABS Take 40 mg by mouth daily.  Patient not taking: Reported on 01/27/2023    [provider]  losartan (COZAAR) 50 MG tablet TAKE 1 TABLET BY MOUTH DAILY 03/04/23   Loyola Mast, MD  Multiple Vitamins-Minerals (HAIR/SKIN/NAILS/BIOTIN PO) Take 3 tablets by mouth daily.    [provider]  nitroGLYCERIN (NITROSTAT) 0.4 MG SL tablet Place 1 tablet (0.4 mg total) under the tongue every 5 (five) minutes as needed for up to 25 days for chest pain. 02/14/21 01/27/23  Yates Decamp, MD  ondansetron (ZOFRAN) 4 MG tablet Take 1 tablet (4 mg total) by mouth every 6 (six) hours. 04/11/23   Netta Corrigan, PA-C                                                                                   Vitals:   04/27/23 1415 04/27/23 1430 04/27/23 1453 04/27/23 1700  BP: 128/61 118/79  113/82  Pulse: 90 87  87  Resp: 14 18    Temp:   98.5 F (36.9 C) 99.9 F (37.7 C)  TempSrc:   Oral Oral  SpO2: 98% 93%    Weight:      Height:       Physical Exam Vitals and nursing note reviewed.   Constitutional:      General: She is not in acute distress. HENT:     Head: Normocephalic and atraumatic.     Right Ear: Hearing normal.     Left Ear: Hearing normal.     Nose: Nose normal. No nasal deformity.     Mouth/Throat:     Lips: Pink.     Tongue: No lesions.     Pharynx: Oropharynx is clear.  Eyes:     General: Lids are normal.     Extraocular Movements: Extraocular movements intact.  Cardiovascular:     Rate and Rhythm: Normal rate and regular rhythm.     Heart sounds: Normal heart sounds.  Pulmonary:     Effort: Pulmonary effort is normal.     Breath sounds: Normal breath sounds.  Abdominal:     General: Bowel sounds are normal. There is no distension.     Palpations: Abdomen is soft. There is no mass.     Tenderness: There is no abdominal tenderness.  Musculoskeletal:     Right lower leg: No edema.     Left lower leg: No edema.  Skin:    General: Skin is warm.  Neurological:     General: No focal deficit present.     Mental Status: She is alert and oriented to person, place, and time.     Cranial Nerves: Cranial nerves 2-12 are intact.  Psychiatric:        Attention and Perception: Attention normal.        Mood and Affect: Mood normal.        Speech: Speech normal.        Behavior: Behavior normal. Behavior is cooperative.      Labs on Admission: I have personally  reviewed following labs and imaging studies  CBC: Recent Labs  Lab 04/27/23 0608  WBC 9.9  HGB 13.9  HCT 41.8  MCV 90.3  PLT 325   Basic Metabolic Panel: Recent Labs  Lab 04/27/23 0608 04/27/23 1002  NA 140  --   K 3.5  --   CL 101  --   CO2 23  --   GLUCOSE 128*  --   BUN 20  --   CREATININE 3.37*  --   CALCIUM 9.8  --   MG  --  2.3   GFR: Estimated Creatinine Clearance: 22.4 mL/min (A) (by C-G formula based on SCr of 3.37 mg/dL (H)). Liver Function Tests: Recent Labs  Lab 04/27/23 0608  AST 51*  ALT 65*  ALKPHOS 47  BILITOT 1.2  PROT 7.3  ALBUMIN 4.1   Recent  Labs  Lab 04/27/23 0608  LIPASE 36   No results for input(s): "AMMONIA" in the last 168 hours. Coagulation Profile: No results for input(s): "INR", "PROTIME" in the last 168 hours. Cardiac Enzymes: Recent Labs  Lab 04/27/23 1002  CKTOTAL 100   BNP (last 3 results) No results for input(s): "PROBNP" in the last 8760 hours. HbA1C: No results for input(s): "HGBA1C" in the last 72 hours. CBG: No results for input(s): "GLUCAP" in the last 168 hours. Lipid Profile: No results for input(s): "CHOL", "HDL", "LDLCALC", "TRIG", "CHOLHDL", "LDLDIRECT" in the last 72 hours. Thyroid Function Tests: No results for input(s): "TSH", "T4TOTAL", "FREET4", "T3FREE", "THYROIDAB" in the last 72 hours. Anemia Panel: No results for input(s): "VITAMINB12", "FOLATE", "FERRITIN", "TIBC", "IRON", "RETICCTPCT" in the last 72 hours. Urine analysis:    Component Value Date/Time   COLORURINE YELLOW 04/27/2023 0604   APPEARANCEUR CLOUDY (A) 04/27/2023 0604   LABSPEC 1.009 04/27/2023 0604   PHURINE 5.0 04/27/2023 0604   GLUCOSEU NEGATIVE 04/27/2023 0604   GLUCOSEU NEGATIVE 10/04/2019 0830   HGBUR NEGATIVE 04/27/2023 0604   BILIRUBINUR NEGATIVE 04/27/2023 0604   BILIRUBINUR 1 mg/dL 19/14/7829 5621   KETONESUR NEGATIVE 04/27/2023 0604   PROTEINUR 30 (A) 04/27/2023 0604   UROBILINOGEN 1.0 10/09/2020 0838   UROBILINOGEN 0.2 10/04/2019 0830   NITRITE NEGATIVE 04/27/2023 0604   LEUKOCYTESUR SMALL (A) 04/27/2023 0604    Radiological Exams on Admission: CT ABDOMEN PELVIS WO CONTRAST Result Date: 04/27/2023 CLINICAL DATA:  Acute abdominal pain. EXAM: CT ABDOMEN AND PELVIS WITHOUT CONTRAST TECHNIQUE: Multidetector CT imaging of the abdomen and pelvis was performed following the standard protocol without IV contrast. RADIATION DOSE REDUCTION: This exam was performed according to the departmental dose-optimization program which includes automated exposure control, adjustment of the mA and/or kV according to patient  size and/or use of iterative reconstruction technique. COMPARISON:  04/11/2023 FINDINGS: Lower chest: No acute findings. Hepatobiliary: Mild-to-moderate diffuse hepatic steatosis again noted. No mass visualized on this unenhanced exam. Gallbladder is unremarkable. No evidence of biliary ductal dilatation. Pancreas: No mass or inflammatory process visualized on this unenhanced exam. Spleen:  Within normal limits in size. Adrenals/Urinary tract: No evidence of urolithiasis or hydronephrosis. Unremarkable unopacified urinary bladder. Stomach/Bowel: No evidence of obstruction, inflammatory process, or abnormal fluid collections. Normal appendix visualized. Diverticulosis is seen mainly involving the sigmoid colon, however there is no evidence of diverticulitis. Vascular/Lymphatic: No pathologically enlarged lymph nodes identified. No evidence of abdominal aortic aneurysm. Reproductive: Prior hysterectomy noted. Adnexal regions are unremarkable in appearance. Other:  None. Musculoskeletal:  No suspicious bone lesions identified. IMPRESSION: No evidence of urolithiasis, hydronephrosis, or other acute findings. Colonic diverticulosis, without radiographic  evidence of diverticulitis. Stable hepatic steatosis. Electronically Signed   By: Danae Orleans M.D.   On: 04/27/2023 11:58   Data Reviewed: Relevant notes from primary care and specialist visits, past discharge summaries as available in EHR, including Care Everywhere. Prior diagnostic testing as pertinent to current admission diagnoses, Updated medications and problem lists for reconciliation ED course, including vitals, labs, imaging, treatment and response to treatment,Triage notes, nursing and pharmacy notes and ED provider's notes Notable results as noted in HPI.Discussed case with EDMD/ ED APP/ or Specialty MD on call and as needed.  Assessment & Plan   >> Abdominal pain: Differentials include viral gastroenteritis or bacterial gastroenteritis.  Patient's  symptoms are acute in onset after eating a [place] burger.  Will obtain a stool PCR studies and cultures. Differentials also include showing elevated Salmonella infection as patient has developed an acute kidney injury.  Clinical findings and clinical presentation are atypical in presentation. Will continue to follow and provide additional supportive measures with pain control IV fluid hydration. CT of the abdomen without contrast was negative for any obstruction hydronephrosis. As needed morphine, Dilaudid with parameters, antiemetics.  IV PPI.  Clear liquid diet advance as tolerated.  >> AKI: Lab Results  Component Value Date   CREATININE 3.37 (H) 04/27/2023   CREATININE 1.00 04/11/2023   CREATININE 0.94 04/11/2023  I/O last 3 completed shifts: In: 1123.5 [IV Piggyback:1123.5] Out: -  No intake/output data recorded. Continue patient on IV fluids for the next 24 to 48 hours.  >> Essential hypertension: Home regimen includes losartan which we have held, in addition to amlodipine which is held due to soft blood pressures. Vitals:   04/27/23 0945 04/27/23 1100 04/27/23 1115 04/27/23 1130  BP: (!) 144/79 134/64 128/68 118/62   04/27/23 1200 04/27/23 1215 04/27/23 1245 04/27/23 1330  BP: 129/76 124/69 119/66 109/72   04/27/23 1400 04/27/23 1415 04/27/23 1430 04/27/23 1700  BP: (!) 125/90 128/61 118/79 113/82    DVT prophylaxis:  Heparin Consults:  None Advance Care Planning:    Code Status: Full Code   Family Communication:  None Disposition Plan:  Home Severity of Illness: The appropriate patient status for this patient is INPATIENT. Inpatient status is judged to be reasonable and necessary in order to provide the required intensity of service to ensure the patient's safety. The patient's presenting symptoms, physical exam findings, and initial radiographic and laboratory data in the context of their chronic comorbidities is felt to place them at high risk for further clinical  deterioration. Furthermore, it is not anticipated that the patient will be medically stable for discharge from the hospital within 2 midnights of admission.   * I certify that at the point of admission it is my clinical judgment that the patient will require inpatient hospital care spanning beyond 2 midnights from the point of admission due to high intensity of service, high risk for further deterioration and high frequency of surveillance required.*  Author: Gertha Calkin, MD 04/27/2023 8:41 PM  For on call review [email or web address].   Unresulted Labs (From admission, onward)     Start     Ordered   04/28/23 0500  Comprehensive metabolic panel  Tomorrow morning,   R        04/27/23 1312   04/28/23 0500  CBC  Tomorrow morning,   R        04/27/23 1312   04/27/23 2130  Lactic acid, plasma  Once,   STAT        04/27/23  2130   04/27/23 1311  HIV Antibody (routine testing w rflx)  (HIV Antibody (Routine testing w reflex) panel)  Once,   R        04/27/23 1312   04/27/23 1250  Gastrointestinal Panel by PCR , Stool  (Gastrointestinal Panel by PCR, Stool                                                                                                                                                     **Does Not include CLOSTRIDIUM DIFFICILE testing. **If CDIFF testing is needed, place order from the "C Difficile Testing" order set.**)  Once,   URGENT        04/27/23 1249   04/27/23 1248  Urine Culture  Once,   URGENT       Question:  Indication  Answer:  Dysuria   04/27/23 1247   04/27/23 0923  Blood culture (routine x 2)  BLOOD CULTURE X 2,   R      04/27/23 0925   Unscheduled  Occult blood card to lab, stool RN will collect  As needed,   R     Question:  Specimen to be collected by:  Answer:  RN will collect   04/27/23 1251            Orders Placed This Encounter  Procedures   Blood culture (routine x 2)   Urine Culture   Resp panel by RT-PCR (RSV, Flu A&B, Covid) Anterior Nasal Swab    Gastrointestinal Panel by PCR , Stool   CT ABDOMEN PELVIS WO CONTRAST   Lipase, blood   Comprehensive metabolic panel   CBC   Urinalysis, Routine w reflex microscopic -Urine, Clean Catch   CK   Occult blood card to lab, stool RN will collect   Ethanol   Magnesium   HIV Antibody (routine testing w rflx)   Comprehensive metabolic panel   CBC   Hepatitis panel, acute   Lactic acid, plasma   Lactic acid, plasma   Diet clear liquid Room service appropriate? Yes; Fluid consistency: Thin   Strict intake and output   Maintain IV access   Vital signs   Notify physician (specify)   Mobility Protocol: No Restrictions RN to initiate protocols based on patient's level of care   Refer to Sidebar Report Refer to ICU, Med-Surg, Progressive, and Step-Down Mobility Protocol Sidebars   Initiate Adult Central Line Maintenance and Catheter Protocol for patients with central line (CVC, PICC, Port, Hemodialysis, Trialysis)   Daily weights   Intake and Output   Do not place and if present remove PureWick   Initiate Oral Care Protocol   Initiate Carrier Fluid Protocol   RN may order General Admission PRN Orders utilizing "General Admission PRN medications" (through manage orders) for the following patient needs: allergy symptoms (Claritin), cold sores (Carmex),  cough (Robitussin DM), eye irritation (Liquifilm Tears), hemorrhoids (Tucks), indigestion (Maalox), minor skin irritation (Hydrocortisone Cream), muscle pain Romeo Apple Gay), nose irritation (saline nasal spray) and sore throat (Chloraseptic spray).   Cardiac Monitoring - Continuous Indefinite   Full code   Consult to hospitalist   Enteric precautions (UV disinfection) C difficile, Norovirus   Pulse oximetry check with vital signs   Oxygen therapy Mode or (Route): Nasal cannula; Liters Per Minute: 2; Keep O2 saturation between: greater than 92 %   I-Stat CG4 Lactic Acid   EKG 12-Lead   EKG 12-Lead   EKG 12-Lead   Place in observation (patient's  expected length of stay will be less than 2 midnights)   Aspiration precautions

## 2023-04-27 NOTE — ED Provider Notes (Signed)
 [place] EMERGENCY DEPARTMENT AT [facility] Provider Note   CSN: [id number] Arrival date & time: 04/27/23  0546     History  Chief Complaint  Patient presents with   Abdominal Pain    [name] is a 53 y.o. female.  53 year old female presents today for concern of feeling weak, and having some nausea, vomiting, diarrhea since yesterday.  She also reports fever with her own home thermometer of 100.5 prior to arrival to the emergency department.  She states over the past week she has been participating in the fast along with her church which is a 1 week fast where she is only allowed to have water.  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  Denies any dysuria or other urinary complaints.  The history is provided by the patient. No language interpreter was used.       Home Medications Prior to Admission medications   Medication Sig Start Date End Date Taking? Authorizing Provider  albuterol (VENTOLIN HFA) 108 (90 Base) MCG/ACT inhaler Inhale 1-2 puffs into the lungs every 6 (six) hours as needed for wheezing or shortness of breath. 10/31/21   Tomi Bamberger, PA-C  amLODipine (NORVASC) 5 MG tablet TAKE 1 TABLET BY MOUTH DAILY 07/22/22   Loyola Mast, MD  atorvastatin (LIPITOR) 20 MG tablet TAKE 1 TABLET BY MOUTH DAILY 07/22/22   Loyola Mast, MD  Black Cohosh (REMIFEMIN MENOPAUSE PO)     [provider]  Black Cohosh 20 MG TABS Take 40 mg by mouth daily.  Patient not taking: Reported on 01/27/2023    [provider]  losartan (COZAAR) 50 MG tablet TAKE 1 TABLET BY MOUTH DAILY 03/04/23   Loyola Mast, MD  Multiple Vitamins-Minerals (HAIR/SKIN/NAILS/BIOTIN PO) Take 3 tablets by mouth daily.    [provider]  nitroGLYCERIN (NITROSTAT) 0.4 MG SL tablet Place 1 tablet (0.4 mg total) under the tongue every 5 (five) minutes as needed for up to 25 days  for chest pain. 02/14/21 01/27/23  Yates Decamp, MD  ondansetron (ZOFRAN) 4 MG tablet Take 1 tablet (4 mg total) by mouth every 6 (six) hours. 04/11/23   Netta Corrigan, PA-C      Allergies    Keflex [cephalexin], Penicillins, and Prednisone    Review of Systems   Review of Systems  Constitutional:  Negative for chills and fever.  Respiratory:  Negative for shortness of breath.   Cardiovascular:  Negative for chest pain.  Gastrointestinal:  Positive for abdominal pain, diarrhea, nausea and vomiting.  Genitourinary:  Negative for dysuria.  Neurological:  Negative for light-headedness.  All other systems reviewed and are negative.   Physical Exam Updated Vital Signs BP (!) 144/79   Pulse 87   Temp 98.6 F (37 C) (Oral)   Resp 13   Ht 5\' 4"  (1.626 m)   Wt 103.9 kg   SpO2 94%   BMI 39.31 kg/m  Physical Exam Vitals and nursing note reviewed.  Constitutional:      General: She is not in acute distress.    Appearance: Normal appearance. She is not ill-appearing.  HENT:     Head: Normocephalic and atraumatic.     Nose: Nose normal.  Eyes:     General: No scleral icterus.    Extraocular Movements: Extraocular movements intact.     Conjunctiva/sclera: Conjunctivae normal.  Cardiovascular:     Rate and Rhythm:  Normal rate and regular rhythm.  Pulmonary:     Effort: Pulmonary effort is normal. No respiratory distress.     Breath sounds: Normal breath sounds. No wheezing or rales.  Abdominal:     General: There is no distension.     Tenderness: There is no abdominal tenderness.  Musculoskeletal:        General: Normal range of motion.     Cervical back: Normal range of motion.  Skin:    General: Skin is warm and dry.  Neurological:     General: No focal deficit present.     Mental Status: She is alert. Mental status is at baseline.     ED Results / Procedures / Treatments   Labs (all labs ordered are listed, but only abnormal results are displayed) Labs Reviewed   COMPREHENSIVE METABOLIC PANEL - Abnormal; Notable for the following components:      Result Value   Glucose, Bld 128 (*)    Creatinine, Ser 3.37 (*)    AST 51 (*)    ALT 65 (*)    GFR, Estimated 16 (*)    Anion gap 16 (*)    All other components within normal limits  URINALYSIS, ROUTINE W REFLEX MICROSCOPIC - Abnormal; Notable for the following components:   APPearance CLOUDY (*)    Protein, ur 30 (*)    Leukocytes,Ua SMALL (*)    Bacteria, UA MANY (*)    All other components within normal limits  CULTURE, BLOOD (ROUTINE X 2)  CULTURE, BLOOD (ROUTINE X 2)  LIPASE, BLOOD  CBC  CK    EKG None  Radiology No results found.  Procedures Procedures    Medications Ordered in ED Medications  lactated ringers bolus 2,000 mL (has no administration in time range)  ciprofloxacin (CIPRO) IVPB 400 mg (has no administration in time range)    ED Course/ Medical Decision Making/ A&P                                 Medical Decision Making Amount and/or Complexity of Data Reviewed Labs: ordered. Radiology: ordered.  Risk Prescription drug management.   Medical Decision Making / ED Course   This patient presents to the ED for concern of weakness, nausea, vomiting, diarrhea, this involves an extensive number of treatment options, and is a complaint that carries with it a high risk of complications and morbidity.  The differential diagnosis includes gastroenteritis, colitis, diverticulitis, AKI, appendicitis, cholecystitis  MDM: 53 year old female presents today for concern of not feeling well since yesterday.  Since yesterday she has had vomiting, diarrhea, and some abdominal discomfort.  She has been intermittently fasting for 1 week which she broke on Friday.  CBC is unremarkable.  CK is within normal.  Lipase within normal.  CMP shows creatinine of 3.37 which is up from baseline of around 1.  UA with some concern for UTI.  Will send urine culture to confirm.  CT abdomen  pelvis without contrast shows no evidence of acute finding.  2 L fluid bolus given.  Dose of antibiotic given for UTI.  Patient given Cipro due to allergies to penicillin as well as cephalosporins.  Discussed with hospitalist.  Patient will be evaluated for admission by Dr. Allena Katz.  Lab Tests: -I ordered, reviewed, and interpreted labs.   The pertinent results include:   Labs Reviewed  COMPREHENSIVE METABOLIC PANEL - Abnormal; Notable for the following components:  Result Value   Glucose, Bld 128 (*)    Creatinine, Ser 3.37 (*)    AST 51 (*)    ALT 65 (*)    GFR, Estimated 16 (*)    Anion gap 16 (*)    All other components within normal limits  URINALYSIS, ROUTINE W REFLEX MICROSCOPIC - Abnormal; Notable for the following components:   APPearance CLOUDY (*)    Protein, ur 30 (*)    Leukocytes,Ua SMALL (*)    Bacteria, UA MANY (*)    All other components within normal limits  CULTURE, BLOOD (ROUTINE X 2)  CULTURE, BLOOD (ROUTINE X 2)  URINE CULTURE  RESP PANEL BY RT-PCR (RSV, FLU A&B, COVID)  RVPGX2  GASTROINTESTINAL PANEL BY PCR, STOOL (REPLACES STOOL CULTURE)  LIPASE, BLOOD  CBC  CK  HEPATITIS PANEL, ACUTE  ETHANOL  MAGNESIUM  I-STAT CG4 LACTIC ACID, ED      EKG  EKG Interpretation Date/Time:    Ventricular Rate:    PR Interval:    QRS Duration:    QT Interval:    QTC Calculation:   R Axis:      Text Interpretation:           Imaging Studies ordered: I ordered imaging studies including CT abdomen pelvis without contrast I independently visualized and interpreted imaging. I agree with the radiologist interpretation   Medicines ordered and prescription drug management: Meds ordered this encounter  Medications   lactated ringers bolus 2,000 mL   ciprofloxacin (CIPRO) IVPB 400 mg    Antibiotic Indication::   UTI   fentaNYL (SUBLIMAZE) injection 50 mcg   pantoprazole (PROTONIX) injection 40 mg    -I have reviewed the patients home medicines and  have made adjustments as needed  Critical interventions Fluid hydration   Cardiac Monitoring: The patient was maintained on a cardiac monitor.  I personally viewed and interpreted the cardiac monitored which showed an underlying rhythm of: Normal sinus rhythm  Social Determinants of Health:  Factors impacting patients care include: Good family support   Reevaluation: After the interventions noted above, I reevaluated the patient and found that they have :stayed the same  Co morbidities that complicate the patient evaluation  Past Medical History:  Diagnosis Date   Abnormal Pap smear 2000   [facility] Biopsy   Acute respiratory failure with hypoxia (HCC) 10/19/2019   Allergy    Arthritis    LEFT knee   Back pain    Blood transfusion without reported diagnosis 2005   Breast fibroadenoma    Diverticulitis of colon with perforation 05/20/2014   GERD (gastroesophageal reflux disease)    H/O metrorrhagia 12/2006   H/O myomectomy 07/2006   robotic   H/O sinusitis    Headache(784.0)    Heart murmur    dx age [age]- no issues currently   History of conization of cervix 2000   History of ectopic pregnancy 2005   r salpyngectomy   History of hysterectomy, supracervical 08/2008   robotic   History of syphilis    Hx of adenomatous polyp of colon 12/31/2017   Hx of herpes simplex type 2 infection    Hyperlipidemia    on meds   Hypertension    on meds   Ileostomy present (HCC) 10/26/2014   reversed in 10/2014   Intra-abdominal abscess (HCC) 07/21/2014   PMS (premenstrual syndrome)    Seasonal allergies    Sleep apnea    does not use CPAP      Dispostion: Discussed  with hospitalist.  They will evaluate patient for admission.   Final Clinical Impression(s) / ED Diagnoses Final diagnoses:  None    Rx / DC Orders ED Discharge Orders     None         Marita Kansas, PA-C 04/27/23 1306    Elayne Snare K, DO 04/27/23 1536

## 2023-04-28 DIAGNOSIS — N390 Urinary tract infection, site not specified: Secondary | ICD-10-CM | POA: Diagnosis present

## 2023-04-28 DIAGNOSIS — J439 Emphysema, unspecified: Secondary | ICD-10-CM | POA: Diagnosis present

## 2023-04-28 DIAGNOSIS — R000 Tachycardia, unspecified: Secondary | ICD-10-CM | POA: Diagnosis present

## 2023-04-28 DIAGNOSIS — B9620 Unspecified Escherichia coli [E. coli] as the cause of diseases classified elsewhere: Secondary | ICD-10-CM | POA: Diagnosis present

## 2023-04-28 DIAGNOSIS — H409 Unspecified glaucoma: Secondary | ICD-10-CM | POA: Diagnosis present

## 2023-04-28 DIAGNOSIS — Z833 Family history of diabetes mellitus: Secondary | ICD-10-CM | POA: Diagnosis not present

## 2023-04-28 DIAGNOSIS — Z6841 Body Mass Index (BMI) 40.0 and over, adult: Secondary | ICD-10-CM | POA: Diagnosis not present

## 2023-04-28 DIAGNOSIS — R1032 Left lower quadrant pain: Secondary | ICD-10-CM | POA: Diagnosis not present

## 2023-04-28 DIAGNOSIS — R7989 Other specified abnormal findings of blood chemistry: Secondary | ICD-10-CM | POA: Diagnosis present

## 2023-04-28 DIAGNOSIS — E66813 Obesity, class 3: Secondary | ICD-10-CM | POA: Diagnosis present

## 2023-04-28 DIAGNOSIS — G4733 Obstructive sleep apnea (adult) (pediatric): Secondary | ICD-10-CM | POA: Diagnosis present

## 2023-04-28 DIAGNOSIS — Z1624 Resistance to multiple antibiotics: Secondary | ICD-10-CM | POA: Diagnosis present

## 2023-04-28 DIAGNOSIS — Z881 Allergy status to other antibiotic agents status: Secondary | ICD-10-CM | POA: Diagnosis not present

## 2023-04-28 DIAGNOSIS — I10 Essential (primary) hypertension: Secondary | ICD-10-CM | POA: Diagnosis present

## 2023-04-28 DIAGNOSIS — R109 Unspecified abdominal pain: Secondary | ICD-10-CM | POA: Diagnosis present

## 2023-04-28 DIAGNOSIS — Z8249 Family history of ischemic heart disease and other diseases of the circulatory system: Secondary | ICD-10-CM | POA: Diagnosis not present

## 2023-04-28 DIAGNOSIS — E860 Dehydration: Secondary | ICD-10-CM | POA: Diagnosis present

## 2023-04-28 DIAGNOSIS — Z83438 Family history of other disorder of lipoprotein metabolism and other lipidemia: Secondary | ICD-10-CM | POA: Diagnosis not present

## 2023-04-28 DIAGNOSIS — Z87891 Personal history of nicotine dependence: Secondary | ICD-10-CM | POA: Diagnosis not present

## 2023-04-28 DIAGNOSIS — Z1152 Encounter for screening for COVID-19: Secondary | ICD-10-CM | POA: Diagnosis not present

## 2023-04-28 DIAGNOSIS — E785 Hyperlipidemia, unspecified: Secondary | ICD-10-CM | POA: Diagnosis present

## 2023-04-28 DIAGNOSIS — K529 Noninfective gastroenteritis and colitis, unspecified: Secondary | ICD-10-CM | POA: Diagnosis present

## 2023-04-28 DIAGNOSIS — E876 Hypokalemia: Secondary | ICD-10-CM | POA: Diagnosis present

## 2023-04-28 DIAGNOSIS — R7303 Prediabetes: Secondary | ICD-10-CM | POA: Diagnosis present

## 2023-04-28 DIAGNOSIS — N179 Acute kidney failure, unspecified: Secondary | ICD-10-CM | POA: Diagnosis present

## 2023-04-28 LAB — CBC
HCT: 37.9 % (ref 36.0–46.0)
Hemoglobin: 12.3 g/dL (ref 12.0–15.0)
MCH: 29.1 pg (ref 26.0–34.0)
MCHC: 32.5 g/dL (ref 30.0–36.0)
MCV: 89.8 fL (ref 80.0–100.0)
Platelets: 241 10*3/uL (ref 150–400)
RBC: 4.22 MIL/uL (ref 3.87–5.11)
RDW: 13.3 % (ref 11.5–15.5)
WBC: 6.1 10*3/uL (ref 4.0–10.5)
nRBC: 0 % (ref 0.0–0.2)

## 2023-04-28 LAB — COMPREHENSIVE METABOLIC PANEL
ALT: 45 U/L — ABNORMAL HIGH (ref 0–44)
AST: 34 U/L (ref 15–41)
Albumin: 3.4 g/dL — ABNORMAL LOW (ref 3.5–5.0)
Alkaline Phosphatase: 45 U/L (ref 38–126)
Anion gap: 13 (ref 5–15)
BUN: 16 mg/dL (ref 6–20)
CO2: 25 mmol/L (ref 22–32)
Calcium: 9.3 mg/dL (ref 8.9–10.3)
Chloride: 106 mmol/L (ref 98–111)
Creatinine, Ser: 2.66 mg/dL — ABNORMAL HIGH (ref 0.44–1.00)
GFR, Estimated: 21 mL/min — ABNORMAL LOW (ref >60)
Glucose, Bld: 102 mg/dL — ABNORMAL HIGH (ref 70–99)
Potassium: 3.3 mmol/L — ABNORMAL LOW (ref 3.5–5.1)
Sodium: 144 mmol/L (ref 135–145)
Total Bilirubin: 1.1 mg/dL (ref 0.0–1.2)
Total Protein: 6.5 g/dL (ref 6.5–8.1)

## 2023-04-28 LAB — GASTROINTESTINAL PANEL BY PCR, STOOL (REPLACES STOOL CULTURE)

## 2023-04-28 LAB — LACTIC ACID, PLASMA
Lactic Acid, Venous: 1.1 mmol/L (ref 0.5–1.9)
Lactic Acid, Venous: 1.2 mmol/L (ref 0.5–1.9)

## 2023-04-28 LAB — MAGNESIUM: Magnesium: 1.6 mg/dL — ABNORMAL LOW (ref 1.7–2.4)

## 2023-04-28 LAB — PHOSPHORUS: Phosphorus: 4 mg/dL (ref 2.5–4.6)

## 2023-04-28 MED ORDER — ONDANSETRON HCL 4 MG/2ML IJ SOLN
4.0000 mg | Freq: Four times a day (QID) | INTRAMUSCULAR | Status: DC | PRN
  Administered 2023-04-28: 4 mg via INTRAVENOUS
  Filled 2023-04-28: qty 2

## 2023-04-28 MED ORDER — MAGNESIUM SULFATE 2 GM/50ML IV SOLN
2.0000 g | Freq: Once | INTRAVENOUS | Status: AC
  Administered 2023-04-28: 2 g via INTRAVENOUS
  Filled 2023-04-28: qty 50

## 2023-04-28 MED ORDER — HYDROMORPHONE HCL 2 MG PO TABS: 1.0000 mg | ORAL_TABLET | Freq: Four times a day (QID) | ORAL | Status: DC | PRN

## 2023-04-28 MED ORDER — AMLODIPINE BESYLATE 5 MG PO TABS
5.0000 mg | ORAL_TABLET | Freq: Every day | ORAL | Status: DC
  Administered 2023-04-28 – 2023-04-29 (×2): 5 mg via ORAL
  Filled 2023-04-28 (×2): qty 1

## 2023-04-28 MED ORDER — POTASSIUM CHLORIDE CRYS ER 20 MEQ PO TBCR
20.0000 meq | EXTENDED_RELEASE_TABLET | Freq: Once | ORAL | Status: AC
  Administered 2023-04-28: 20 meq via ORAL
  Filled 2023-04-28: qty 1

## 2023-04-28 MED ORDER — HYDROMORPHONE HCL 2 MG PO TABS: 2.0000 mg | ORAL_TABLET | Freq: Four times a day (QID) | ORAL | Status: DC | PRN

## 2023-04-28 MED ORDER — HYDROMORPHONE HCL 1 MG/ML IJ SOLN: 1.0000 mg | Freq: Four times a day (QID) | INTRAMUSCULAR | Status: DC | PRN

## 2023-04-28 MED ORDER — HYDRALAZINE HCL 20 MG/ML IJ SOLN: 10.0000 mg | Freq: Four times a day (QID) | INTRAMUSCULAR | Status: DC | PRN

## 2023-04-28 NOTE — Plan of Care (Signed)

## 2023-04-28 NOTE — Progress Notes (Signed)
 [facility] Progress Note  Patient: [name]    VWU:[id number]  DOA: 04/27/2023     Date of Service: the patient was seen and examined on 04/28/2023  Chief Complaint  Patient presents with   Abdominal Pain   Brief hospital course: Cherrell Shalaine Payson is a 53 y.o. female with past medical history  of allergies to Keflex penicillin and prednisone,diverticulitis, essential hypertension, prediabetes, emphysema, glaucoma, obesity presenting with abdominal pain and nausea vomiting diarrhea fevers.  Patient states all her symptoms started today.  Denies any other complaints chest pain shortness of breath palpitations headaches blurred vision or falls.   >>ED Course: Vital signs in the ED were notable for the following:  Vitals are stable initially had tachycardia at 114 and low blood pressure on presentation at 1 1/76 O2 sats 95% on room air.   Labs were notable for the following:  Metabolic panel showing glucose of 128 acute kidney injury with a creatinine of 3.37 and GFR of 16, anion gap of 16, elevated LFTs with AST at 51 and ALT at 65. CBC is within normal limits. Urinalysis shows small leukocytes and protein and 1-50 WBCs with many bacteria.   EKG: Independently reviewed ordered /pending.   Imaging and additional notable ED work-up:  CT of the abdomen noncontrast negative for any nephrolithiasis hydronephrosis or any acute findings, shows colonic diverticulosis and hepatic steatosis   While in the ED, patient was given PPI, ciprofloxacin, IV fluid and pain meds. [facility] was consulted for admission and further management as below.   Assessment and Plan:  # Gastroenteritis, patient presented with nausea vomiting diarrhea and abdominal pain s/p [place] burger DD: viral vs bacterial gastroenteritis.  CT of the abdomen without contrast was negative for any obstruction hydronephrosis. Continue PPI Continue IV fluid for hydration Continue as needed pain meds Started regular  diet    # AKI due to dehydration Cr 3.37--2.66 gradually improving Continue IV fluid for hydration  Pyuria UA positive for bacteria, WBC 21-50, small LE, nitrate negative Follow urine culture Monitor off antibiotics for now  # Hypokalemia, mild, potassium repleted. # Hypomagnesemia, mag repleted. Monitor electrolytes and replete as needed.   # Essential hypertension: Continue to hold losartan due to AKI BP improved, resumed amlodipine Monitor BP and titrate medication accordingly Use IV hydralazine as needed   Body mass index is 40.98 kg/m.  Interventions:  Diet: Regular diet DVT Prophylaxis: Subcutaneous Heparin    Advance goals of care discussion: Full code  Family Communication: family was present at bedside, at the time of interview.  The pt provided permission to discuss medical plan with the family. Opportunity was given to ask question and all questions were answered satisfactorily.   Disposition:  Pt is from Home, admitted with AKI, still has elevated Cr, which precludes a safe discharge. Discharge to Home, when stable, in 1-2 days .  Subjective: No significant overnight events, patient is complaining of left lower quadrant abdominal pain 3/10, vomited x 2 and 1 episode of loose stools. Denied any chest pain or palpitation, no shortness of breath.  Physical Exam: General: NAD, lying comfortably Appear in no distress, affect appropriate Eyes: PERRLA ENT: Oral Mucosa Clear, moist  Neck: no JVD,  Cardiovascular: S1 and S2 Present, no Murmur,  Respiratory: good respiratory effort, Bilateral Air entry equal and Decreased, no Crackles, no wheezes Abdomen: Bowel Sound present, Soft and Mild LLQ tenderness,  Skin: no rashes Extremities: no Pedal edema, no calf tenderness Neurologic: without any new focal findings  Gait not checked due to patient safety concerns  Vitals:   04/28/23 0451 04/28/23 0740 04/28/23 1200 04/28/23 1300  BP:  131/68  (!) 151/83   Pulse:  81  76  Resp:  (!) 7 17 17   Temp:  98.4 F (36.9 C)  98.6 F (37 C)  TempSrc:  Oral  Oral  SpO2:      Weight: 108.3 kg     Height:        Intake/Output Summary (Last 24 hours) at 04/28/2023 1543 Last data filed at 04/27/2023 2111 Gross per 24 hour  Intake 0 ml  Output --  Net 0 ml   Filed Weights   04/27/23 0605 04/28/23 0451  Weight: 103.9 kg 108.3 kg    Data Reviewed: I have personally reviewed and interpreted daily labs, tele strips, imagings as discussed above. I reviewed all nursing notes, pharmacy notes, vitals, pertinent old records I have discussed plan of care as described above with RN and patient/family.  CBC: Recent Labs  Lab 04/27/23 0608 04/28/23 0759  WBC 9.9 6.1  HGB 13.9 12.3  HCT 41.8 37.9  MCV 90.3 89.8  PLT 325 241   Basic Metabolic Panel: Recent Labs  Lab 04/27/23 0608 04/27/23 1002 04/28/23 0759 04/28/23 0950  NA 140  --  144  --   K 3.5  --  3.3*  --   CL 101  --  106  --   CO2 23  --  25  --   GLUCOSE 128*  --  102*  --   BUN 20  --  16  --   CREATININE 3.37*  --  2.66*  --   CALCIUM 9.8  --  9.3  --   MG  --  2.3  --  1.6*  PHOS  --   --   --  4.0    Studies: No results found.  Scheduled Meds:  heparin  5,000 Units Subcutaneous Q12H    HYDROmorphone (DILAUDID) injection  1 mg Intravenous Q12H   pantoprazole (PROTONIX) IV  40 mg Intravenous Q12H   sodium chloride flush  3 mL Intravenous Q12H   Continuous Infusions:  sodium chloride 100 mL/hr at 04/28/23 0308   PRN Meds: acetaminophen **OR** acetaminophen, ondansetron (ZOFRAN) IV  Time spent: 35 minutes  Author: Gillis Santa. MD [facility] 04/28/2023 3:43 PM  To reach On-call, see care teams to locate the attending and reach out to them via [email or web address]. If 7PM-7AM, please contact night-coverage If you still have difficulty reaching the attending provider, please page the [facility] (Director on Call) for [facility] on amion for assistance.

## 2023-04-29 DIAGNOSIS — R1032 Left lower quadrant pain: Secondary | ICD-10-CM | POA: Diagnosis not present

## 2023-04-29 LAB — BASIC METABOLIC PANEL
Anion gap: 10 (ref 5–15)
BUN: 11 mg/dL (ref 6–20)
CO2: 28 mmol/L (ref 22–32)
Calcium: 9.3 mg/dL (ref 8.9–10.3)
Chloride: 105 mmol/L (ref 98–111)
Creatinine, Ser: 1.7 mg/dL — ABNORMAL HIGH (ref 0.44–1.00)
GFR, Estimated: 35 mL/min — ABNORMAL LOW (ref >60)
Glucose, Bld: 107 mg/dL — ABNORMAL HIGH (ref 70–99)
Potassium: 3.6 mmol/L (ref 3.5–5.1)
Sodium: 143 mmol/L (ref 135–145)

## 2023-04-29 LAB — CBC
HCT: 37.8 % (ref 36.0–46.0)
Hemoglobin: 12.2 g/dL (ref 12.0–15.0)
MCH: 29.1 pg (ref 26.0–34.0)
MCHC: 32.3 g/dL (ref 30.0–36.0)
MCV: 90.2 fL (ref 80.0–100.0)
Platelets: 291 10*3/uL (ref 150–400)
RBC: 4.19 MIL/uL (ref 3.87–5.11)
RDW: 13.3 % (ref 11.5–15.5)
WBC: 6.8 10*3/uL (ref 4.0–10.5)
nRBC: 0 % (ref 0.0–0.2)

## 2023-04-29 LAB — MAGNESIUM: Magnesium: 1.6 mg/dL — ABNORMAL LOW (ref 1.7–2.4)

## 2023-04-29 LAB — PHOSPHORUS: Phosphorus: 4.5 mg/dL (ref 2.5–4.6)

## 2023-04-29 MED ORDER — POTASSIUM CHLORIDE CRYS ER 20 MEQ PO TBCR
40.0000 meq | EXTENDED_RELEASE_TABLET | Freq: Once | ORAL | Status: AC
  Administered 2023-04-29: 40 meq via ORAL
  Filled 2023-04-29: qty 2

## 2023-04-29 MED ORDER — MAGNESIUM SULFATE 2 GM/50ML IV SOLN
2.0000 g | Freq: Once | INTRAVENOUS | Status: AC
  Administered 2023-04-29: 2 g via INTRAVENOUS
  Filled 2023-04-29: qty 50

## 2023-04-29 NOTE — Discharge Summary (Signed)
 [facility] Discharge Summary   Patient: [name] ZOX:[id number]  PCP: Loyola Mast, MD  Date of admission: 04/27/2023   Date of discharge:  04/29/2023     Discharge Diagnoses:  Principal Problem:   Abdominal pain Active Problems:   Essential hypertension   AKI (acute kidney injury) (HCC)   Nausea vomiting and diarrhea   Class 2 obesity due to excess calories with body mass index (BMI) of 39.0 to 39.9 in adult   Obstructive sleep apnea   Admitted From: Home Disposition:  Home   Recommendations for Outpatient Follow-up:  Follow-up with PCP in 1 week, continue to monitor BP at home and follow with PCP to titrate medication accordingly, held losartan for now, resume if systolic BP greater than 140 mmHg. Repeat BMP in 1 week to check renal functions and then restart losartan if needed.  Follow up LABS/TEST: BMP in 1 week   Follow-up Information     Loyola Mast, MD Follow up in 1 week(s).   Specialty: Family Medicine Contact information: [address] [place] [address] [phone]                Diet recommendation: Cardiac diet  Activity: The patient is advised to gradually reintroduce usual activities, as tolerated  Discharge Condition: stable  Code Status: Full code   History of present illness: As per the H and P dictated on admission Hospital Course:  [name] is a 53 y.o. female with past medical history  of allergies to Keflex penicillin and prednisone,diverticulitis, essential hypertension, prediabetes, emphysema, glaucoma, obesity presenting with abdominal pain and nausea vomiting diarrhea fevers.  Patient states all her symptoms started today.  Denies any other complaints chest pain shortness of breath palpitations headaches blurred vision or falls.   ED Course: Vital signs in the ED were notable for the following:  Vitals are stable initially had tachycardia at 114 and low blood pressure on presentation at 1  1/76 O2 sats 95% on room air.   Labs: BMP glucose 128, AKI creatinine 3.37 and eGFR 16, anion gap 16, elevated LFTs with AST at 51 and ALT at 65. CBC is within normal limits. Urinalysis shows small leukocytes and protein and 1-50 WBCs with many bacteria.   EKG: Independently reviewed ordered /pending.   Imaging:   CT of the abdomen noncontrast negative for any nephrolithiasis hydronephrosis or any acute findings, shows colonic diverticulosis and hepatic steatosis   While in the ED, patient was given PPI, ciprofloxacin, IV fluid and pain meds. TRH was consulted for admission and further management as below.     Assessment and Plan:   # Gastroenteritis, patient presented with nausea, vomiting, diarrhea and abdominal pain s/p [place] burger. DD: viral vs bacterial gastroenteritis.  CT of the abdomen without contrast was negative for any obstruction hydronephrosis. S/p PPI, IVF, and prn meds for symptomatic treatment.  Gradually her symptoms improved, diet was resumed and patient was able to tolerate, denied any nausea vomiting or diarrhea today.  Stool GIP PCR negative # AKI due to dehydration, Cr 3.37--2.66---1.7  gradually improving, s/p IVF, recommended to continue oral hydration. # Pyuria: UA positive for bacteria, WBC 21-50, small LE, nitrate negative Follow urine culture grew E. Coli 10k, patient was monitored off antibiotics.  Patient denied any UTI symptoms.  Urine culture report finalized on 04/30/2023 E. coli multidrug-resistant, sensitive to Zosyn, imipenem and gentamicin.  Clinically patient had no signs symptoms of infection. Patient was recommended to follow with PCP  in 1 week # Hypokalemia, mild, potassium repleted.  Resolved # Hypomagnesemia, mag repleted IV before discharge. # Essential hypertension: Continue to hold losartan due to AKI. BP improved, resumed amlodipine.  Patient was advised to monitor BP and follow-up with PCP to titrate medication accordingly.  Body mass index  is 40.49 kg/m.  Nutrition Interventions:   Patient was ambulatory without any assistance. On the day of the discharge the patient's vitals were stable, and no other acute medical condition were reported by patient. the patient was felt safe to be discharge at Home.  Consultants: None Procedures: None  Discharge Exam: General: Appear in no distress, no Rash; Oral Mucosa Clear, moist. Cardiovascular: S1 and S2 Present, no Murmur, Respiratory: normal respiratory effort, Bilateral Air entry present and no Crackles, no wheezes Abdomen: Bowel Sound present, Soft and no tenderness, no hernia Extremities: no Pedal edema, no calf tenderness Neurology: alert and oriented to time, place, and person affect appropriate.  Filed Weights   04/27/23 0605 04/28/23 0451 04/29/23 0500  Weight: 103.9 kg 108.3 kg 107 kg   Vitals:   04/29/23 0715 04/29/23 1128  BP: 131/82 106/85  Pulse: 80 80  Resp: 18 18  Temp: 98.8 F (37.1 C) 98.4 F (36.9 C)  SpO2:      DISCHARGE MEDICATION: Allergies as of 04/29/2023       Reactions   Keflex [cephalexin] Anaphylaxis   Penicillins Anaphylaxis   Has patient had a PCN reaction causing immediate rash, facial/tongue/throat swelling, SOB or lightheadedness with hypotension: Yes Has patient had a PCN reaction causing severe rash involving mucus membranes or skin necrosis: No Has patient had a PCN reaction that required hospitalization: No Has patient had a PCN reaction occurring within the last 10 years: No If all of the above answers are "NO", then may proceed with Cephalosporin use.   Prednisone Other (See Comments)   "It makes me feel like I am out of my body"        Medication List     PAUSE taking these medications    losartan 50 MG tablet Wait to take this until: May 01, 2023 Commonly known as: COZAAR TAKE 1 TABLET BY MOUTH DAILY       STOP taking these medications    nitroGLYCERIN 0.4 MG SL tablet Commonly known as: Nitrostat    ondansetron 4 MG tablet Commonly known as: ZOFRAN   oxyCODONE 5 MG immediate release tablet Commonly known as: Oxy IR/ROXICODONE       TAKE these medications    albuterol 108 (90 Base) MCG/ACT inhaler Commonly known as: VENTOLIN HFA Inhale 1-2 puffs into the lungs every 6 (six) hours as needed for wheezing or shortness of breath.   amLODipine 5 MG tablet Commonly known as: NORVASC TAKE 1 TABLET BY MOUTH DAILY   atorvastatin 20 MG tablet Commonly known as: LIPITOR TAKE 1 TABLET BY MOUTH DAILY   Black Cohosh 21 MG Tabs Take 2 tablets by mouth daily. Remifemin   HAIR/SKIN/NAILS/BIOTIN PO Take 3 tablets by mouth daily.   methocarbamol 500 MG tablet Commonly known as: ROBAXIN Take 500 mg by mouth every 6 (six) hours as needed.       Allergies  Allergen Reactions   Keflex [Cephalexin] Anaphylaxis   Penicillins Anaphylaxis    Has patient had a PCN reaction causing immediate rash, facial/tongue/throat swelling, SOB or lightheadedness with hypotension: Yes Has patient had a PCN reaction causing severe rash involving mucus membranes or skin necrosis: No Has patient had a PCN reaction  that required hospitalization: No Has patient had a PCN reaction occurring within the last 10 years: No If all of the above answers are "NO", then may proceed with Cephalosporin use.    Prednisone Other (See Comments)    "It makes me feel like I am out of my body"   Discharge Instructions     Call MD for:  difficulty breathing, headache or visual disturbances   Complete by: As directed    Call MD for:  extreme fatigue   Complete by: As directed    Call MD for:  persistant dizziness or light-headedness   Complete by: As directed    Call MD for:  persistant nausea and vomiting   Complete by: As directed    Call MD for:  severe uncontrolled pain   Complete by: As directed    Call MD for:  temperature >100.4   Complete by: As directed    Diet - low sodium heart healthy   Complete by: As  directed    Discharge instructions   Complete by: As directed    Follow-up with PCP in 1 week, continue to monitor BP at home and follow with PCP to titrate medication accordingly, held losartan for now, resume if systolic BP greater than 140 mmHg.  Repeat BMP in 1 week to check renal functions and then restart losartan if needed.   Increase activity slowly   Complete by: As directed        The results of significant diagnostics from this hospitalization (including imaging, microbiology, ancillary and laboratory) are listed below for reference.    Significant Diagnostic Studies: CT ABDOMEN PELVIS WO CONTRAST Result Date: 04/27/2023 CLINICAL DATA:  Acute abdominal pain. EXAM: CT ABDOMEN AND PELVIS WITHOUT CONTRAST TECHNIQUE: Multidetector CT imaging of the abdomen and pelvis was performed following the standard protocol without IV contrast. RADIATION DOSE REDUCTION: This exam was performed according to the departmental dose-optimization program which includes automated exposure control, adjustment of the mA and/or kV according to patient size and/or use of iterative reconstruction technique. COMPARISON:  04/11/2023 FINDINGS: Lower chest: No acute findings. Hepatobiliary: Mild-to-moderate diffuse hepatic steatosis again noted. No mass visualized on this unenhanced exam. Gallbladder is unremarkable. No evidence of biliary ductal dilatation. Pancreas: No mass or inflammatory process visualized on this unenhanced exam. Spleen:  Within normal limits in size. Adrenals/Urinary tract: No evidence of urolithiasis or hydronephrosis. Unremarkable unopacified urinary bladder. Stomach/Bowel: No evidence of obstruction, inflammatory process, or abnormal fluid collections. Normal appendix visualized. Diverticulosis is seen mainly involving the sigmoid colon, however there is no evidence of diverticulitis. Vascular/Lymphatic: No pathologically enlarged lymph nodes identified. No evidence of abdominal aortic aneurysm.  Reproductive: Prior hysterectomy noted. Adnexal regions are unremarkable in appearance. Other:  None. Musculoskeletal:  No suspicious bone lesions identified. IMPRESSION: No evidence of urolithiasis, hydronephrosis, or other acute findings. Colonic diverticulosis, without radiographic evidence of diverticulitis. Stable hepatic steatosis. Electronically Signed   By: Danae Orleans M.D.   On: 04/27/2023 11:58   CT ABDOMEN PELVIS W CONTRAST Result Date: 04/11/2023 CLINICAL DATA:  Left-sided abdominal pain with fevers and chills. EXAM: CT ABDOMEN AND PELVIS WITH CONTRAST TECHNIQUE: Multidetector CT imaging of the abdomen and pelvis was performed using the standard protocol following bolus administration of intravenous contrast. RADIATION DOSE REDUCTION: This exam was performed according to the departmental dose-optimization program which includes automated exposure control, adjustment of the mA and/or kV according to patient size and/or use of iterative reconstruction technique. CONTRAST:  75mL OMNIPAQUE IOHEXOL 350 MG/ML SOLN COMPARISON:  CT abdomen pelvis dated Jul 07, 2022. FINDINGS: Lower chest: No acute abnormality.  Centrilobular emphysema. Hepatobiliary: Diffusely decreased liver density without focal abnormality. Normal gallbladder. No biliary dilatation. Pancreas: Unremarkable. No pancreatic ductal dilatation or surrounding inflammatory changes. Spleen: Normal in size without focal abnormality. Adrenals/Urinary Tract: Adrenal glands are unremarkable. Kidneys are normal, without renal calculi, focal lesion, or hydronephrosis. Bladder is unremarkable. Stomach/Bowel: The stomach is within normal limits. Postsurgical changes of the distal ileum and sigmoid colon related to prior partial sigmoid colon resection with diverting ileostomy. Left-sided colonic diverticulosis. No bowel wall thickening, distention, or surrounding inflammatory changes. Normal appendix. Vascular/Lymphatic: Aortic atherosclerosis. No  enlarged abdominal or pelvic lymph nodes. Reproductive: Status post hysterectomy. No adnexal masses. Other: No free fluid or pneumoperitoneum. Unchanged diastasis of the ventral abdominal wall. Unchanged small fat containing hernia defect in the right anterior abdominal wall at the site of prior ileostomy. Musculoskeletal: No acute or significant osseous findings. IMPRESSION: 1. No acute intra-abdominal process. 2. Hepatic steatosis. 3. Aortic Atherosclerosis (ICD10-I70.0) and Emphysema (ICD10-J43.9). Electronically Signed   By: Obie Dredge M.D.   On: 04/11/2023 16:50    Microbiology: Recent Results (from the past 240 hours)  Blood culture (routine x 2)     Status: None (Preliminary result)   Collection Time: 04/27/23  9:23 AM   Specimen: BLOOD  Result Value Ref Range Status   Specimen Description BLOOD SITE NOT SPECIFIED  Final   Special Requests   Final    BOTTLES DRAWN AEROBIC AND ANAEROBIC Blood Culture adequate volume   Culture   Final    NO GROWTH 2 DAYS Performed at [facility], [address], [place], [place] [address]    Report Status PENDING  Incomplete  Blood culture (routine x 2)     Status: None (Preliminary result)   Collection Time: 04/27/23 10:37 AM   Specimen: BLOOD  Result Value Ref Range Status   Specimen Description BLOOD RIGHT ANTECUBITAL  Final   Special Requests   Final    BOTTLES DRAWN AEROBIC ONLY Blood Culture results may not be optimal due to an inadequate volume of blood received in culture bottles   Culture   Final    NO GROWTH 2 DAYS Performed at [facility], [address], [place], [place] [address]    Report Status PENDING  Incomplete  Urine Culture     Status: Abnormal (Preliminary result)   Collection Time: 04/27/23 12:48 PM   Specimen: Urine, Clean Catch  Result Value Ref Range Status   Specimen Description URINE, CLEAN CATCH  Final   Special Requests   Final    NONE Performed at [facility], [address],  [place], [place] [address]    Culture 10,000 COLONIES/mL ESCHERICHIA COLI (A)  Final   Report Status PENDING  Incomplete  Resp panel by RT-PCR (RSV, Flu A&B, Covid) Anterior Nasal Swab     Status: None   Collection Time: 04/27/23 12:49 PM   Specimen: Anterior Nasal Swab  Result Value Ref Range Status   SARS Coronavirus 2 by RT PCR NEGATIVE NEGATIVE Final   Influenza A by PCR NEGATIVE NEGATIVE Final   Influenza B by PCR NEGATIVE NEGATIVE Final    Comment: (NOTE) The Xpert Xpress SARS-CoV-2/FLU/RSV plus assay is intended as an aid in the diagnosis of influenza from Nasopharyngeal swab specimens and should not be used as a sole basis for treatment. Nasal washings and aspirates are unacceptable for Xpert Xpress SARS-CoV-2/FLU/RSV testing.  Fact  Sheet for Patients: BloggerCourse.com  Fact Sheet for Healthcare Providers: SeriousBroker.it  This test is not yet approved or cleared by the [place] FDA and has been authorized for detection and/or diagnosis of SARS-CoV-2 by FDA under an Emergency Use Authorization (EUA). This EUA will remain in effect (meaning this test can be used) for the duration of the COVID-19 declaration under Section 564(b)(1) of the Act, 21 U.S.C. section 360bbb-3(b)(1), unless the authorization is terminated or revoked.     Resp Syncytial Virus by PCR NEGATIVE NEGATIVE Final    Comment: (NOTE) Fact Sheet for Patients: BloggerCourse.com  Fact Sheet for Healthcare Providers: SeriousBroker.it  This test is not yet approved or cleared by the [place] FDA and has been authorized for detection and/or diagnosis of SARS-CoV-2 by FDA under an Emergency Use Authorization (EUA). This EUA will remain in effect (meaning this test can be used) for the duration of the COVID-19 declaration under Section 564(b)(1) of the Act, 21 U.S.C. section 360bbb-3(b)(1), unless the  authorization is terminated or revoked.  Performed at [facility], [address] [place], [place] [address]   Gastrointestinal Panel by PCR , Stool     Status: None   Collection Time: 04/27/23 12:50 PM   Specimen: Urine, Clean Catch; Stool  Result Value Ref Range Status   Campylobacter species NOT DETECTED NOT DETECTED Final   Plesimonas shigelloides NOT DETECTED NOT DETECTED Final   Salmonella species NOT DETECTED NOT DETECTED Final   Yersinia enterocolitica NOT DETECTED NOT DETECTED Final   Vibrio species NOT DETECTED NOT DETECTED Final   Vibrio cholerae NOT DETECTED NOT DETECTED Final   Enteroaggregative E coli (EAEC) NOT DETECTED NOT DETECTED Final   Enteropathogenic E coli (EPEC) NOT DETECTED NOT DETECTED Final   Enterotoxigenic E coli (ETEC) NOT DETECTED NOT DETECTED Final   Shiga like toxin producing E coli (STEC) NOT DETECTED NOT DETECTED Final   Shigella/Enteroinvasive E coli (EIEC) NOT DETECTED NOT DETECTED Final   Cryptosporidium NOT DETECTED NOT DETECTED Final   Cyclospora cayetanensis NOT DETECTED NOT DETECTED Final   Entamoeba histolytica NOT DETECTED NOT DETECTED Final   Giardia lamblia NOT DETECTED NOT DETECTED Final   Adenovirus F40/41 NOT DETECTED NOT DETECTED Final   Astrovirus NOT DETECTED NOT DETECTED Final   Norovirus GI/GII NOT DETECTED NOT DETECTED Final   Rotavirus A NOT DETECTED NOT DETECTED Final   Sapovirus (I, II, IV, and V) NOT DETECTED NOT DETECTED Final    Comment: Performed at [facility], [address] [place], [place] [address]     Labs: CBC: Recent Labs  Lab 04/27/23 [phone] 04/28/23 0759 04/29/23 0608  WBC 9.9 6.1 6.8  HGB 13.9 12.3 12.2  HCT 41.8 37.9 37.8  MCV 90.3 89.8 90.2  PLT 325 241 291   Basic Metabolic Panel: Recent Labs  Lab 04/27/23 0608 04/27/23 1002 04/28/23 0759 04/28/23 0950 04/29/23 0608  NA 140  --  144  --  143  K 3.5  --  3.3*  --  3.6  CL 101  --  106  --  105  CO2 23  --  25  --  28   GLUCOSE 128*  --  102*  --  107*  BUN 20  --  16  --  11  CREATININE 3.37*  --  2.66*  --  1.70*  CALCIUM 9.8  --  9.3  --  9.3  MG  --  2.3  --  1.6* 1.6*  PHOS  --   --   --  4.0 4.5   Liver Function Tests: Recent Labs  Lab 04/27/23 0608 04/28/23 0759  AST 51* 34  ALT 65* 45*  ALKPHOS 47 45  BILITOT 1.2 1.1  PROT 7.3 6.5  ALBUMIN 4.1 3.4*   Recent Labs  Lab 04/27/23 0608  LIPASE 36   No results for input(s): "AMMONIA" in the last 168 hours. Cardiac Enzymes: Recent Labs  Lab 04/27/23 1002  CKTOTAL 100   BNP (last 3 results) Recent Labs    04/11/23 1111  BNP 7.6   CBG: No results for input(s): "GLUCAP" in the last 168 hours.  Time spent: 35 minutes  Signed:  Gillis Santa  [facility] 04/29/2023 3:01 PM

## 2023-04-29 NOTE — Progress Notes (Signed)
 AVS printed and reviewed by RN Escorted Patient  via wheel chair to discharge via private auto

## 2023-04-30 ENCOUNTER — Telehealth: Payer: Self-pay

## 2023-04-30 LAB — URINE CULTURE: Culture: 10000 — AB

## 2023-04-30 NOTE — Transitions of Care (Post Inpatient/ED Visit) (Signed)
   04/30/2023  Name: [name] MRN: [id number] DOB: Mar 15, 1970  Today's TOC FU Call Status: Today's TOC FU Call Status:: Successful TOC FU Call Completed TOC FU Call Complete Date: 04/30/23 Patient's Name and Date of Birth confirmed.  Transition Care Management Follow-up Telephone Call Date of Discharge: 04/29/23 Discharge Facility: [facility]) Type of Discharge: Inpatient Admission Primary Inpatient Discharge Diagnosis:: abd pain How have you been since you were released from the hospital?: Better Any questions or concerns?: No  Items Reviewed: Did you receive and understand the discharge instructions provided?: Yes Medications obtained,verified, and reconciled?: Yes (Medications Reviewed) Any new allergies since your discharge?: No Dietary orders reviewed?: Yes Do you have support at home?: Yes People in Home: spouse  Medications Reviewed Today: Medications Reviewed Today     Reviewed by Karena Addison, LPN (Licensed Practical Nurse) on 04/30/23 at 1010  Med List Status: <None>   Medication Order Taking? Sig Documenting Provider Last Dose Status Informant  albuterol (VENTOLIN HFA) 108 (90 Base) MCG/ACT inhaler [id number] No Inhale 1-2 puffs into the lungs every 6 (six) hours as needed for wheezing or shortness of breath. Tomi Bamberger, PA-C Taking Active Self  amLODipine (NORVASC) 5 MG tablet [id number] No TAKE 1 TABLET BY MOUTH DAILY Loyola Mast, MD 04/26/2023 Active Self  atorvastatin (LIPITOR) 20 MG tablet [id number] No TAKE 1 TABLET BY MOUTH DAILY Loyola Mast, MD 04/26/2023 Active Self  Black Cohosh 21 MG TABS [id number] No Take 2 tablets by mouth daily. Remifemin [provider] 04/26/2023 Morning Active Self  losartan (COZAAR) 50 MG tablet [id number] No TAKE 1 TABLET BY MOUTH DAILY Loyola Mast, MD 04/26/2023 Active Self  methocarbamol (ROBAXIN) 500 MG tablet [id number] No Take 500 mg by mouth every 6 (six) hours as needed. [provider]  Past Month Active Self  Multiple Vitamins-Minerals (HAIR/SKIN/NAILS/BIOTIN PO) [id number] No Take 3 tablets by mouth daily. [provider] 04/26/2023 Active Self            Home Care and Equipment/Supplies: Were Home Health Services Ordered?: NA Any new equipment or medical supplies ordered?: NA  Functional Questionnaire: Do you need assistance with bathing/showering or dressing?: No Do you need assistance with meal preparation?: No Do you need assistance with eating?: No Do you have difficulty maintaining continence: No Do you need assistance with getting out of bed/getting out of a chair/moving?: No Do you have difficulty managing or taking your medications?: No  Follow up appointments reviewed: PCP Follow-up appointment confirmed?: Yes Date of PCP follow-up appointment?: 05/02/23 Follow[facility]up Provider: [facility] Follow-up appointment confirmed?: NA Do you need transportation to your follow-up appointment?: No Do you understand care options if your condition(s) worsen?: Yes-patient verbalized understanding    SIGNATURE Karena Addison, LPN [facility] Nurse Health Advisor Direct Dial [phone]

## 2023-05-02 ENCOUNTER — Inpatient Hospital Stay: Admitting: Family Medicine

## 2023-05-02 LAB — CULTURE, BLOOD (ROUTINE X 2)
Culture: NO GROWTH
Culture: NO GROWTH
Special Requests: ADEQUATE

## 2023-05-12 ENCOUNTER — Ambulatory Visit: Payer: 59 | Admitting: Physical Therapy

## 2023-05-15 ENCOUNTER — Telehealth: Payer: Self-pay | Admitting: Family Medicine

## 2023-05-15 NOTE — Telephone Encounter (Signed)
 05/02/2023 same day cancellation by e2c2, no reason noted, 1st missed visit since 2023, letter sent via [facility]

## 2023-05-16 ENCOUNTER — Encounter: Payer: Self-pay | Admitting: Family Medicine

## 2023-05-16 ENCOUNTER — Ambulatory Visit: Admitting: Family Medicine

## 2023-05-16 VITALS — BP 122/70 | HR 98 | Temp 98.2°F | Ht 64.0 in | Wt 231.2 lb

## 2023-05-16 DIAGNOSIS — J301 Allergic rhinitis due to pollen: Secondary | ICD-10-CM

## 2023-05-16 DIAGNOSIS — N816 Rectocele: Secondary | ICD-10-CM | POA: Insufficient documentation

## 2023-05-16 DIAGNOSIS — E876 Hypokalemia: Secondary | ICD-10-CM | POA: Diagnosis not present

## 2023-05-16 DIAGNOSIS — N179 Acute kidney failure, unspecified: Secondary | ICD-10-CM

## 2023-05-16 DIAGNOSIS — I10 Essential (primary) hypertension: Secondary | ICD-10-CM

## 2023-05-16 DIAGNOSIS — N3946 Mixed incontinence: Secondary | ICD-10-CM | POA: Insufficient documentation

## 2023-05-16 LAB — COMPREHENSIVE METABOLIC PANEL WITH GFR
ALT: 25 U/L (ref 0–35)
AST: 20 U/L (ref 0–37)
Albumin: 4.5 g/dL (ref 3.5–5.2)
Alkaline Phosphatase: 58 U/L (ref 39–117)
BUN: 9 mg/dL (ref 6–23)
CO2: 30 meq/L (ref 19–32)
Calcium: 10.1 mg/dL (ref 8.4–10.5)
Chloride: 102 meq/L (ref 96–112)
Creatinine, Ser: 0.83 mg/dL (ref 0.40–1.20)
GFR: 80.07 mL/min (ref >60.00)
Glucose, Bld: 88 mg/dL (ref 70–99)
Potassium: 4.1 meq/L (ref 3.5–5.1)
Sodium: 141 meq/L (ref 135–145)
Total Bilirubin: 0.6 mg/dL (ref 0.2–1.2)
Total Protein: 7.1 g/dL (ref 6.0–8.3)

## 2023-05-16 LAB — MAGNESIUM: Magnesium: 1.8 mg/dL (ref 1.5–2.5)

## 2023-05-16 LAB — CBC
HCT: 39 % (ref 36.0–46.0)
Hemoglobin: 12.8 g/dL (ref 12.0–15.0)
MCHC: 32.9 g/dL (ref 30.0–36.0)
MCV: 90 fl (ref 78.0–100.0)
Platelets: 340 10*3/uL (ref 150.0–400.0)
RBC: 4.33 Mil/uL (ref 3.87–5.11)
RDW: 13.8 % (ref 11.5–15.5)
WBC: 6.9 10*3/uL (ref 4.0–10.5)

## 2023-05-16 MED ORDER — CETIRIZINE HCL 10 MG PO TABS: 10.0000 mg | ORAL_TABLET | Freq: Every day | ORAL | 11 refills | Status: DC

## 2023-05-16 NOTE — Assessment & Plan Note (Signed)
 I will reassess her BMP today. I anticipate that this will have resolved.

## 2023-05-16 NOTE — Telephone Encounter (Signed)
Please review and advise. Thanks. Dm/cma  

## 2023-05-16 NOTE — Progress Notes (Signed)
 [facility]IMARY [facility]ARE [facility] Trecia Rogers [facility] [address] [place] [address] Dept: [phone] Dept Fax: [phone]  Hospital Follow-up Visit  Subjective:    Patient ID: [name], female    DOB: April 09, 1970, 53 y.o..   MRN: [id number]  Chief Complaint  Patient presents with   Hospitalization Follow-up    Hospital f/u for abdominal pain (AKI) (04/29/23),.     History of Present Illness:  Patient is in today for follow-up form a recent hospitalization. She was admitted at [facility] from 3/9-3/12/2023 with acute N/V/D leading to acute kidney injury. She been doing a fast for 3 days the previous week. After breaking her fast, she at at [facility]. Soon after, she developed her Gi symptoms along with fever. At the ED, she was found to have AKI. She was managed primarily with IV fluids. At this point, she feels she is back to her normal. She is eating regularly and taking fluids. She denies any fever or GI symptoms.  Past Medical History: Patient Active Problem List   Diagnosis Date Noted   Urinary incontinence, mixed 05/16/2023   Rectocele 05/16/2023   Left carpal tunnel syndrome 01/27/2023   Nocturnal cough 11/11/2022   Palpitations 11/11/2022   Ganglion cyst of finger of left hand 02/25/2022   Hypertensive retinopathy of right eye 11/19/2021   Open angle with borderline findings and low glaucoma risk in both eyes 11/19/2021   Obstructive sleep apnea 06/10/2021   Degenerative joint disease (DJD) of lumbar spine 04/17/2021   Derangement of meniscus of left knee 04/17/2021   Osteoarthritis of left hip 04/17/2021   Centrilobular emphysema (HCC) 06/29/2020   Aortic atherosclerosis (HCC) 06/29/2020   Mixed hyperlipidemia 10/19/2019   History of diverticulitis 11/05/2018   Entrapment of left ulnar nerve at elbow 10/19/2018   Prediabetes 10/08/2018   Class 2 obesity due to excess calories with body mass index (BMI) of 39.0 to 39.9 in adult 03/29/2018    Nonspecific abnormal electrocardiogram (ECG) (EKG) 03/27/2018   Atypical chest pain 03/27/2018   Trigger ring finger of right hand 08/07/2015   Primary osteoarthritis of first carpometacarpal joint of right hand 08/07/2015   Diverticulitis of sigmoid colon s/p colectomy 06/30/2014 07/26/2014   Abdominal pain    Hx of adenomatous polyp of colon 12/23/2013   Essential hypertension 11/02/2013   AKI (acute kidney injury) (HCC) 11/02/2013   Former smoker 11/26/2008   Past Surgical History:  Procedure Laterality Date   ABDOMINAL HYSTERECTOMY     BREAST LUMPECTOMY  11/2009   rt-neg   BREAST SURGERY  2004   fibroademoma   CERVICAL CONE BIOPSY  2000   COLON RESECTION N/A 07/06/2014   Procedure: LAPAROSCOPIC LOOP ILEOSTOMY WITH PLACEMENT OF PELVIC DRAIN;  Surgeon: Manus Amit Meloy, MD;  Location: [facility] OR;  Service: General;  Laterality: N/A;   COLON SURGERY  10/2014   reversal of ileostomy   COLONOSCOPY  12/2018   GM-MAC-suprep(good)-SSPx 5, end to end colonic anastomosis sigmoidcolon   DILATION AND CURETTAGE OF UTERUS  2009   DIVERTING ILEOSTOMY Right 06/2014   ECTOPIC PREGNANCY SURGERY  2005   R salpyngectomy   ILEOSTOMY N/A 10/26/2014   Procedure: LOOP ILEOSTOMY REVERSAL;  Surgeon: Manus Mava Suares, MD;  Location: [facility] OR;  Service: General;  Laterality: N/A;   LAPAROSCOPIC SIGMOID COLECTOMY N/A 06/30/2014   Procedure: LAPAROSCOPIC ASSISTED SIGMOID COLECTOMY;  Surgeon: Manus Erby Sanderson, MD;  Location: [facility] OR;  Service: General;  Laterality: N/A;   MYOMECTOMY  2008   robotic  POLYPECTOMY  2009   POLYPECTOMY  2020   SSP x 5   ROBOTIC ASSISTED LAP VAGINAL HYSTERECTOMY  2010   supracervical   SALPINGECTOMY Right 2005   ectopic pregnancy-RIGHT tube removed   TRIGGER FINGER RELEASE Right 09/28/2012   Procedure: RELEASE TRIGGER FINGER/A-1 PULLEY RIGHT THUMB;  Surgeon: Jodi Marble, MD;  Location: [facility];  Service: Orthopedics;  Laterality: Right;   ULNAR NERVE TRANSPOSITION  Right 12/01/2018   Procedure: ULNAR NERVE DECOMPRESSION;  Surgeon: Cindee Salt, MD;  Location: [facility];  Service: Orthopedics;  Laterality: Right;  AXILLARY   WISDOM TOOTH EXTRACTION  2022   Family History  Problem Relation Age of Onset   Sarcoidosis Mother    Hypertension Mother    Hyperlipidemia Mother    Prostate cancer Father 6       radiation seed   Fibroids Sister    Thyroid disease Maternal Aunt    Heart disease Paternal Uncle    Hypertension Maternal Grandmother    Hypertension Paternal Grandmother    Diabetes Paternal Grandmother    Mental illness Cousin    Colon cancer Neg Hx    Colon polyps Neg Hx    Esophageal cancer Neg Hx    Rectal cancer Neg Hx    Stomach cancer Neg Hx    Sleep apnea Neg Hx    Outpatient Medications Prior to Visit  Medication Sig Dispense Refill   albuterol (VENTOLIN HFA) 108 (90 Base) MCG/ACT inhaler Inhale 1-2 puffs into the lungs every 6 (six) hours as needed for wheezing or shortness of breath. 8 g 0   amLODipine (NORVASC) 5 MG tablet TAKE 1 TABLET BY MOUTH DAILY 90 tablet 3   atorvastatin (LIPITOR) 20 MG tablet TAKE 1 TABLET BY MOUTH DAILY 90 tablet 3   Black Cohosh 21 MG TABS Take 2 tablets by mouth daily. Remifemin     losartan (COZAAR) 50 MG tablet TAKE 1 TABLET BY MOUTH DAILY 90 tablet 3   Multiple Vitamins-Minerals (HAIR/SKIN/NAILS/BIOTIN PO) Take 3 tablets by mouth daily.     methocarbamol (ROBAXIN) 500 MG tablet Take 500 mg by mouth every 6 (six) hours as needed.     No facility-administered medications prior to visit.   Allergies  Allergen Reactions   Keflex [Cephalexin] Anaphylaxis   Penicillins Anaphylaxis    Has patient had a PCN reaction causing immediate rash, facial/tongue/throat swelling, SOB or lightheadedness with hypotension: Yes Has patient had a PCN reaction causing severe rash involving mucus membranes or skin necrosis: No Has patient had a PCN reaction that required hospitalization: No Has  patient had a PCN reaction occurring within the last 10 years: No If all of the above answers are "NO", then may proceed with Cephalosporin use.    Prednisone Other (See Comments)    "It makes me feel like I am out of my body"     Objective:   Today's Vitals   05/16/23 1035  BP: 122/70  Pulse: 98  Temp: 98.2 F (36.8 C)  TempSrc: Temporal  SpO2: 99%  Weight: 231 lb 3.2 oz (104.9 kg)  Height: 5\' 4"  (1.626 m)   Body mass index is 39.69 kg/m.   General: Well developed, well nourished. No acute distress. Abdomen: Soft, non-tender.  Psych: Alert and oriented. Normal mood and affect.  Health Maintenance Due  Topic Date Due   Pneumococcal Vaccine 76-96 Years old (1 of 2 - PCV) Never done   Cervical Cancer Screening (HPV/Pap Cotest)  07/05/2023  Lab Results    Latest Ref Rng & Units 04/29/2023    6:08 AM 04/28/2023    7:59 AM 04/27/2023    6:08 AM  CMP  Glucose 70 - 99 mg/dL 161  096  045   BUN 6 - 20 mg/dL 11  16  20    Creatinine 0.44 - 1.00 mg/dL 4.09  8.11  9.14   Sodium 135 - 145 mmol/L 143  144  140   Potassium 3.5 - 5.1 mmol/L 3.6  3.3  3.5   Chloride 98 - 111 mmol/L 105  106  101   CO2 22 - 32 mmol/L 28  25  23    Calcium 8.9 - 10.3 mg/dL 9.3  9.3  9.8   Total Protein 6.5 - 8.1 g/dL  6.5  7.3   Total Bilirubin 0.0 - 1.2 mg/dL  1.1  1.2   Alkaline Phos 38 - 126 U/L  45  47   AST 15 - 41 U/L  34  51   ALT 0 - 44 U/L  45  65      Assessment & Plan:   Problem List Items Addressed This Visit       Cardiovascular and Mediastinum   Essential hypertension   Blood pressure is normal today. Her losartan had been held after her hospital stay, but she has no resumed this. I will reassess renal function. Continue amlodipine 5 mg daily and losartan 50 mg daily.         Genitourinary   AKI (acute kidney injury) (HCC) - Primary   I will reassess her BMP today. I anticipate that this will have resolved.      Relevant Orders   CBC   Comprehensive metabolic panel with  GFR   Other Visit Diagnoses       Hypomagnesemia       Magnesium was low in the hospital. I will reassess this today.   Relevant Orders   Magnesium     Hypokalemia       Potassium had been low in the hospital. I will reassess this today.   Relevant Orders   Comprehensive metabolic panel with GFR       Return in about 3 months (around 08/16/2023) for Reassessment.   Loyola Mast, MD

## 2023-05-16 NOTE — Assessment & Plan Note (Signed)
 Blood pressure is normal today. Her losartan had been held after her hospital stay, but she has no resumed this. I will reassess renal function. Continue amlodipine 5 mg daily and losartan 50 mg daily.

## 2023-06-03 ENCOUNTER — Encounter: Payer: Self-pay | Admitting: Family Medicine

## 2023-06-03 DIAGNOSIS — F418 Other specified anxiety disorders: Secondary | ICD-10-CM

## 2023-06-03 MED ORDER — ALPRAZOLAM 0.25 MG PO TABS: 0.2500 mg | ORAL_TABLET | Freq: Two times a day (BID) | ORAL | 0 refills | Status: DC | PRN

## 2023-06-24 ENCOUNTER — Other Ambulatory Visit: Payer: Self-pay | Admitting: Family Medicine

## 2023-06-24 DIAGNOSIS — E782 Mixed hyperlipidemia: Secondary | ICD-10-CM

## 2023-06-24 DIAGNOSIS — I10 Essential (primary) hypertension: Secondary | ICD-10-CM

## 2023-07-03 NOTE — Therapy (Deleted)
 OUTPATIENT PHYSICAL THERAPY FEMALE PELVIC EVALUATION   Patient Name: [name] MRN: [id number] DOB:[birth year], 53 y.o., female Today's Date: 07/03/2023  END OF SESSION:   Past Medical History:  Diagnosis Date   Abnormal Pap smear 2000   [facility] Biopsy   Acute respiratory failure with hypoxia (HCC) 10/19/2019   Allergy    Arthritis    LEFT knee   Back pain    Blood transfusion without reported diagnosis 2005   Breast fibroadenoma    Diverticulitis of colon with perforation 05/20/2014   GERD (gastroesophageal reflux disease)    H/O metrorrhagia 12/2006   H/O myomectomy 07/2006   robotic   H/O sinusitis    Headache(784.0)    Heart murmur    dx age [age]- no issues currently   History of conization of cervix 2000   History of ectopic pregnancy 2005   r salpyngectomy   History of hysterectomy, supracervical 08/2008   robotic   History of syphilis    Hx of adenomatous polyp of colon 12/31/2017   Hx of herpes simplex type 2 infection    Hyperlipidemia    on meds   Hypertension    on meds   Ileostomy present (HCC) 10/26/2014   reversed in 10/2014   Intra-abdominal abscess (HCC) 07/21/2014   PMS (premenstrual syndrome)    Seasonal allergies    Sleep apnea    does not use CPAP   Past Surgical History:  Procedure Laterality Date   ABDOMINAL HYSTERECTOMY     BREAST LUMPECTOMY  11/2009   rt-neg   BREAST SURGERY  2004   fibroademoma   CERVICAL CONE BIOPSY  2000   COLON RESECTION N/A 07/06/2014   Procedure: LAPAROSCOPIC LOOP ILEOSTOMY WITH PLACEMENT OF PELVIC DRAIN;  Surgeon: Dareen Ebbing, MD;  Location: [facility] OR;  Service: General;  Laterality: N/A;   COLON SURGERY  10/2014   reversal of ileostomy   COLONOSCOPY  12/2018   GM-MAC-suprep(good)-SSPx 5, end to end colonic anastomosis sigmoidcolon   DILATION AND CURETTAGE OF UTERUS  2009   DIVERTING ILEOSTOMY Right 06/2014   ECTOPIC PREGNANCY SURGERY  2005   R salpyngectomy   ILEOSTOMY N/A 10/26/2014   Procedure:  LOOP ILEOSTOMY REVERSAL;  Surgeon: Dareen Ebbing, MD;  Location: [facility] OR;  Service: General;  Laterality: N/A;   LAPAROSCOPIC SIGMOID COLECTOMY N/A 06/30/2014   Procedure: LAPAROSCOPIC ASSISTED SIGMOID COLECTOMY;  Surgeon: Dareen Ebbing, MD;  Location: [facility];  Service: General;  Laterality: N/A;   MYOMECTOMY  2008   robotic   POLYPECTOMY  2009   POLYPECTOMY  2020   SSP x 5   ROBOTIC ASSISTED LAP VAGINAL HYSTERECTOMY  2010   supracervical   SALPINGECTOMY Right 2005   ectopic pregnancy-RIGHT tube removed   TRIGGER FINGER RELEASE Right 09/28/2012   Procedure: RELEASE TRIGGER FINGER/A-1 PULLEY RIGHT THUMB;  Surgeon: Sheryl Donna, MD;  Location: [facility];  Service: Orthopedics;  Laterality: Right;   ULNAR NERVE TRANSPOSITION Right 12/01/2018   Procedure: ULNAR NERVE DECOMPRESSION;  Surgeon: Lyanne Sample, MD;  Location: [facility];  Service: Orthopedics;  Laterality: Right;  AXILLARY   WISDOM TOOTH EXTRACTION  2022   Patient Active Problem List   Diagnosis Date Noted   Urinary incontinence, mixed 05/16/2023   Rectocele 05/16/2023   Left carpal tunnel syndrome 01/27/2023   Nocturnal cough 11/11/2022   Palpitations 11/11/2022   Ganglion cyst of finger of left hand 02/25/2022   Hypertensive retinopathy of right eye 11/19/2021   Open angle  with borderline findings and low glaucoma risk in both eyes 11/19/2021   Obstructive sleep apnea 06/10/2021   Degenerative joint disease (DJD) of lumbar spine 04/17/2021   Derangement of meniscus of left knee 04/17/2021   Osteoarthritis of left hip 04/17/2021   Centrilobular emphysema (HCC) 06/29/2020   Aortic atherosclerosis (HCC) 06/29/2020   Mixed hyperlipidemia 10/19/2019   History of diverticulitis 11/05/2018   Entrapment of left ulnar nerve at elbow 10/19/2018   Prediabetes 10/08/2018   Class 2 obesity due to excess calories with body mass index (BMI) of 39.0 to 39.9 in adult 03/29/2018   Nonspecific abnormal  electrocardiogram (ECG) (EKG) 03/27/2018   Atypical chest pain 03/27/2018   Trigger ring finger of right hand 08/07/2015   Primary osteoarthritis of first carpometacarpal joint of right hand 08/07/2015   Diverticulitis of sigmoid colon s/p colectomy 06/30/2014 07/26/2014   Abdominal pain    Hx of adenomatous polyp of colon 12/23/2013   Essential hypertension 11/02/2013   AKI (acute kidney injury) (HCC) 11/02/2013   Former smoker 11/26/2008    PCP: Graig Lawyer, MD  REFERRING PROVIDER: Ona Bidding, MD   REFERRING DIAG: N39.46 (ICD-10-CM) - Mixed incontinence   THERAPY DIAG:  No diagnosis found.  Rationale for Evaluation and Treatment: Rehabilitation  ONSET DATE: ***  SUBJECTIVE:                                                                                                                                                                                           SUBJECTIVE STATEMENT: *** Fluid intake:   PAIN:  Are you having pain? {yes/no:20286} NPRS scale: ***/10 Pain location: {pelvic pain location:27098}  Pain type: {type:313116} Pain description: {PAIN DESCRIPTION:21022940}   Aggravating factors: *** Relieving factors: ***  PRECAUTIONS: {Therapy precautions:24002}  RED FLAGS: {PT Red Flags:29287}   WEIGHT BEARING RESTRICTIONS: {Yes ***/No:24003}  FALLS:  Has patient fallen in last 6 months? {fallsyesno:27318}  OCCUPATION: ***  ACTIVITY LEVEL : ***  PLOF: {PLOF:24004}  PATIENT GOALS: ***  PERTINENT HISTORY:  Abdominal hysterectomy; colon resection 07/06/2014; Diverting ileostomy 10/26/2014; laparoscopic sigmoid colectomy 06/30/2014; Myomectomy 2008; robotic assisted lap vaginal hysterectomy 2010;  Sexual abuse: {Yes/No:[id number]}  BOWEL MOVEMENT: Pain with bowel movement: {yes/no:20286} Type of bowel movement:{PT BM type:27100} Fully empty rectum: {No/Yes:[id number]} Leakage: {Yes/No:[id number]} Pads: {Yes/No:[id number]} Fiber supplement/laxative  {YES/NO AS:20300}  URINATION: Pain with urination: {yes/no:20286} Fully empty bladder: {Yes/No:[id number]}*** Stream: {PT urination:27102} Urgency: {YES/NO AS:20300} Frequency: *** Leakage: {PT leakage:27103} Pads: {Yes/No:[id number]}  INTERCOURSE:  Ability to have vaginal penetration {YES/NO:21197} Pain with intercourse: {pain with intercourse PA:27099} Dryness{YES/NO AS:20300} Climax: *** Marinoff Scale: ***/3 Laxative:  PREGNANCY: Vaginal deliveries *** Tearing {Yes***/No:[id number]} Episiotomy {  YES/NO AS:20300} C-section deliveries *** Currently pregnant {Yes***/No:[id number]}  PROLAPSE: {PT prolapse:27101}   OBJECTIVE:  Note: Objective measures were completed at Evaluation unless otherwise noted.  DIAGNOSTIC FINDINGS:  ***  PATIENT SURVEYS:  {rehab surveys:24030}  PFIQ-7: ***  COGNITION: Overall cognitive status: {cognition:24006}     SENSATION: Light touch: {intact/deficits:24005}  LUMBAR SPECIAL TESTS:  {lumbar special test:25242}  FUNCTIONAL TESTS:  {Functional tests:24029}  GAIT: Assistive device utilized: {Assistive devices:23999} Comments: ***  POSTURE: {posture:25561}   LUMBARAROM/PROM:  A/PROM A/PROM  eval  Flexion   Extension   Right lateral flexion   Left lateral flexion   Right rotation   Left rotation    (Blank rows = not tested)  LOWER EXTREMITY ROM:  {AROM/PROM:27142} ROM Right eval Left eval  Hip flexion    Hip extension    Hip abduction    Hip adduction    Hip internal rotation    Hip external rotation    Knee flexion    Knee extension    Ankle dorsiflexion    Ankle plantarflexion    Ankle inversion    Ankle eversion     (Blank rows = not tested)  LOWER EXTREMITY MMT:  MMT Right eval Left eval  Hip flexion    Hip extension    Hip abduction    Hip adduction    Hip internal rotation    Hip external rotation    Knee flexion    Knee extension    Ankle dorsiflexion    Ankle plantarflexion    Ankle  inversion    Ankle eversion     (Blank rows = not tested) PALPATION:   General: ***  Pelvic Alignment: ***  Abdominal: ***                External Perineal Exam: ***                             Internal Pelvic Floor: ***  Patient confirms identification and approves PT to assess internal pelvic floor and treatment {yes/no:20286}  PELVIC MMT:   MMT eval  Vaginal   Internal Anal Sphincter   External Anal Sphincter   Puborectalis   Diastasis Recti   (Blank rows = not tested)        TONE: ***  PROLAPSE: ***  TODAY'S TREATMENT:                                                                                                                              DATE: ***  EVAL ***   PATIENT EDUCATION:  Education details: *** Person educated: {Person educated:25204} Education method: {Education Method:25205} Education comprehension: {Education Comprehension:25206}  HOME EXERCISE PROGRAM: ***  ASSESSMENT:  CLINICAL IMPRESSION: Patient is a *** y.o. *** who was seen today for physical therapy evaluation and treatment for ***.   OBJECTIVE IMPAIRMENTS: {opptimpairments:25111}.   ACTIVITY LIMITATIONS: {activitylimitations:27494}  PARTICIPATION LIMITATIONS: {participationrestrictions:25113}  PERSONAL FACTORS: {  Personal factors:25162} are also affecting patient's functional outcome.   REHAB POTENTIAL: {rehabpotential:25112}  CLINICAL DECISION MAKING: {clinical decision making:25114}  EVALUATION COMPLEXITY: {Evaluation complexity:25115}   GOALS: Goals reviewed with patient? {yes/no:20286}  SHORT TERM GOALS: Target date: ***  *** Baseline: Goal status: INITIAL  2.  *** Baseline:  Goal status: INITIAL  3.  *** Baseline:  Goal status: INITIAL  4.  *** Baseline:  Goal status: INITIAL  5.  *** Baseline:  Goal status: INITIAL  6.  *** Baseline:  Goal status: INITIAL  LONG TERM GOALS: Target date: ***  *** Baseline:  Goal status: INITIAL  2.   *** Baseline:  Goal status: INITIAL  3.  *** Baseline:  Goal status: INITIAL  4.  *** Baseline:  Goal status: INITIAL  5.  *** Baseline:  Goal status: INITIAL  6.  *** Baseline:  Goal status: INITIAL  PLAN:  PT FREQUENCY: {rehab frequency:25116}  PT DURATION: {rehab duration:25117}  PLANNED INTERVENTIONS: {rehab planned interventions:25118::"97110-Therapeutic exercises","97530- Therapeutic [phone]- Neuromuscular re-education","97535- Self JXBJ","47829- Manual therapy"}  PLAN FOR NEXT SESSION: ***   Kenniya Westrich, PT 07/03/2023, 3:42 PM

## 2023-07-04 ENCOUNTER — Telehealth: Payer: Self-pay | Admitting: Physical Therapy

## 2023-07-04 ENCOUNTER — Ambulatory Visit: Attending: Obstetrics and Gynecology | Admitting: Physical Therapy

## 2023-07-04 NOTE — Telephone Encounter (Signed)
 Called patient about her missed evaluation today at 11:00. Left a message.  Marsha Skeen, PT @5 /16/25@ 11:21 AM

## 2023-08-29 ENCOUNTER — Ambulatory Visit: Admitting: Family Medicine

## 2023-09-01 ENCOUNTER — Ambulatory Visit: Admitting: Family Medicine

## 2023-09-01 ENCOUNTER — Encounter: Payer: Self-pay | Admitting: Family Medicine

## 2023-09-01 VITALS — BP 124/72 | HR 89 | Temp 97.0°F | Ht 64.0 in | Wt 238.0 lb

## 2023-09-01 DIAGNOSIS — M19012 Primary osteoarthritis, left shoulder: Secondary | ICD-10-CM | POA: Diagnosis not present

## 2023-09-01 DIAGNOSIS — I10 Essential (primary) hypertension: Secondary | ICD-10-CM | POA: Diagnosis not present

## 2023-09-01 DIAGNOSIS — J432 Centrilobular emphysema: Secondary | ICD-10-CM | POA: Diagnosis not present

## 2023-09-01 DIAGNOSIS — Z6841 Body Mass Index (BMI) 40.0 and over, adult: Secondary | ICD-10-CM

## 2023-09-01 MED ORDER — ALBUTEROL SULFATE HFA 108 (90 BASE) MCG/ACT IN AERS: 1.0000 | INHALATION_SPRAY | Freq: Four times a day (QID) | RESPIRATORY_TRACT | 0 refills | Status: AC | PRN

## 2023-09-01 NOTE — Assessment & Plan Note (Signed)
 Blood pressure is at goal. Continue amlodipine  5 mg daily and losartan  50 mg daily.

## 2023-09-01 NOTE — Assessment & Plan Note (Signed)
 Agree with cautious approach to deciding on shoulder joint replacement. We discussed conservative approaches to managing this pain until she reaches a point where surgery becomes imperative.

## 2023-09-01 NOTE — Assessment & Plan Note (Signed)
 Continue albuterol  inh PRN

## 2023-09-01 NOTE — Progress Notes (Signed)
 [facility]IMARY [facility]ARE [facility] [address] [place] [address] Dept: [phone] Dept Fax: [phone]  Chronic Care Office Visit  Subjective:    Patient ID: [name], female    DOB: 01-Nov-1970, 53 y.o..   MRN: [id number]  Chief Complaint  Patient presents with   Hypertension    3 month f/u HTN.  Questions about weight loss meds.    History of Present Illness:  Patient is in today for reassessment of chronic medical issues.  [name] has a history of hypertension, managed with amlodipine  5 mg daily and losartan  50 mg daily.    [name] has hyperlipidemia. She is managed on atorvastatin  20 mg daily.   Ms. Manner has a history of left shoulder glenohumeral arthritis. She notes the orthopedist has recommended a total shoulder joint replacement. She has some occasional pain and limited ROM. She does not feel ready to commit to shoulder surgery at this point.  [name] has concern for her weight. She is finding her home efforts are not enough to achieve weight loss. She is interested in discussing both surgical and medication approaches.  Past Medical History: Patient Active Problem List   Diagnosis Date Noted   Urinary incontinence, mixed 05/16/2023   Rectocele 05/16/2023   Left carpal tunnel syndrome 01/27/2023   Nocturnal cough 11/11/2022   Palpitations 11/11/2022   Ganglion cyst of finger of left hand 02/25/2022   Hypertensive retinopathy of right eye 11/19/2021   Open angle with borderline findings and low glaucoma risk in both eyes 11/19/2021   Obstructive sleep apnea 06/10/2021   Degenerative joint disease (DJD) of lumbar spine 04/17/2021   Derangement of meniscus of left knee 04/17/2021   Osteoarthritis of left hip 04/17/2021   Centrilobular emphysema (HCC) 06/29/2020   Aortic atherosclerosis (HCC) 06/29/2020   Mixed hyperlipidemia 10/19/2019   History of diverticulitis 11/05/2018   Entrapment of left ulnar nerve  at elbow 10/19/2018   Prediabetes 10/08/2018   Morbid obesity with BMI of 40.0-44.9, adult (HCC) 03/29/2018   Nonspecific abnormal electrocardiogram (ECG) (EKG) 03/27/2018   Atypical chest pain 03/27/2018   Trigger ring finger of right hand 08/07/2015   Primary osteoarthritis of first carpometacarpal joint of right hand 08/07/2015   Diverticulitis of sigmoid colon s/p colectomy 06/30/2014 07/26/2014   Abdominal pain    Hx of adenomatous polyp of colon 12/23/2013   Essential hypertension 11/02/2013   AKI (acute kidney injury) (HCC) 11/02/2013   Former smoker 11/26/2008   Past Surgical History:  Procedure Laterality Date   ABDOMINAL HYSTERECTOMY     BREAST LUMPECTOMY  11/2009   rt-neg   BREAST SURGERY  2004   fibroademoma   CERVICAL CONE BIOPSY  2000   COLON RESECTION N/A 07/06/2014   Procedure: LAPAROSCOPIC LOOP ILEOSTOMY WITH PLACEMENT OF PELVIC DRAIN;  Surgeon: Donnice Lima, MD;  Location: [facility] OR;  Service: General;  Laterality: N/A;   COLON SURGERY  10/2014   reversal of ileostomy   COLONOSCOPY  12/2018   GM-MAC-suprep(good)-SSPx 5, end to end colonic anastomosis sigmoidcolon   DILATION AND CURETTAGE OF UTERUS  2009   DIVERTING ILEOSTOMY Right 06/2014   ECTOPIC PREGNANCY SURGERY  2005   R salpyngectomy   ILEOSTOMY N/A 10/26/2014   Procedure: LOOP ILEOSTOMY REVERSAL;  Surgeon: Donnice Lima, MD;  Location: [facility] OR;  Service: General;  Laterality: N/A;   LAPAROSCOPIC SIGMOID COLECTOMY N/A 06/30/2014   Procedure: LAPAROSCOPIC ASSISTED SIGMOID COLECTOMY;  Surgeon: Donnice Lima, MD;  Location: [facility] OR;  Service: General;  Laterality: N/A;   MYOMECTOMY  2008   robotic   POLYPECTOMY  2009   POLYPECTOMY  2020   SSP x 5   ROBOTIC ASSISTED LAP VAGINAL HYSTERECTOMY  2010   supracervical   SALPINGECTOMY Right 2005   ectopic pregnancy-RIGHT tube removed   TRIGGER FINGER RELEASE Right 09/28/2012   Procedure: RELEASE TRIGGER FINGER/A-1 PULLEY RIGHT THUMB;  Surgeon: Alm DELENA Hummer, MD;   Location: [facility];  Service: Orthopedics;  Laterality: Right;   ULNAR NERVE TRANSPOSITION Right 12/01/2018   Procedure: ULNAR NERVE DECOMPRESSION;  Surgeon: Murrell Kuba, MD;  Location: [facility];  Service: Orthopedics;  Laterality: Right;  AXILLARY   WISDOM TOOTH EXTRACTION  2022   Family History  Problem Relation Age of Onset   Sarcoidosis Mother    Hypertension Mother    Hyperlipidemia Mother    Prostate cancer Father 41       radiation seed   Fibroids Sister    Thyroid  disease Maternal Aunt    Heart disease Paternal Uncle    Hypertension Maternal Grandmother    Hypertension Paternal Grandmother    Diabetes Paternal Grandmother    Mental illness Cousin    Colon cancer Neg Hx    Colon polyps Neg Hx    Esophageal cancer Neg Hx    Rectal cancer Neg Hx    Stomach cancer Neg Hx    Sleep apnea Neg Hx    Outpatient Medications Prior to Visit  Medication Sig Dispense Refill   amLODipine  (NORVASC ) 5 MG tablet TAKE 1 TABLET BY MOUTH DAILY 90 tablet 3   atorvastatin  (LIPITOR) 20 MG tablet TAKE 1 TABLET BY MOUTH ONCE  DAILY 90 tablet 3   cetirizine  (ZYRTEC ) 10 MG tablet Take 1 tablet (10 mg total) by mouth daily. 30 tablet 11   losartan  (COZAAR ) 50 MG tablet TAKE 1 TABLET BY MOUTH DAILY 90 tablet 3   Multiple Vitamins-Minerals (HAIR/SKIN/NAILS/BIOTIN  PO) Take 3 tablets by mouth daily.     albuterol  (VENTOLIN  HFA) 108 (90 Base) MCG/ACT inhaler Inhale 1-2 puffs into the lungs every 6 (six) hours as needed for wheezing or shortness of breath. 8 g 0   ALPRAZolam  (XANAX ) 0.25 MG tablet Take 1 tablet (0.25 mg total) by mouth 2 (two) times daily as needed for anxiety. 1 tablet 0   No facility-administered medications prior to visit.   Allergies  Allergen Reactions   Keflex [Cephalexin] Anaphylaxis   Penicillins Anaphylaxis    Has patient had a PCN reaction causing immediate rash, facial/tongue/throat swelling, SOB or lightheadedness with hypotension:  Yes Has patient had a PCN reaction causing severe rash involving mucus membranes or skin necrosis: No Has patient had a PCN reaction that required hospitalization: No Has patient had a PCN reaction occurring within the last 10 years: No If all of the above answers are NO, then may proceed with Cephalosporin use.    Prednisone  Other (See Comments)    It makes me feel like I am out of my body   Objective:   Today's Vitals   09/01/23 1349  BP: 124/72  Pulse: 89  Temp: (!) 97 F (36.1 C)  TempSrc: Temporal  SpO2: 98%  Weight: 238 lb (108 kg)  Height: 5' 4 (1.626 m)   Body mass index is 40.85 kg/m.   General: Well developed, well nourished. No acute distress. Extremities: Mild crepitance of left shoulder joint. Psych: Alert and oriented. Normal mood and affect.  Health Maintenance Due  Topic  Date Due   Pneumococcal Vaccine 22-52 Years old (1 of 2 - PCV) Never done   Hepatitis B Vaccines (3 of 3 - 19+ 3-dose series) 01/20/2001   Cervical Cancer Screening (HPV/Pap Cotest)  07/05/2023   MAMMOGRAM  07/25/2023     Assessment & Plan:   Problem List Items Addressed This Visit       Cardiovascular and Mediastinum   Essential hypertension - Primary   Blood pressure is at goal. Continue amlodipine  5 mg daily and losartan  50 mg daily.         Respiratory   Centrilobular emphysema (HCC)   Continue albuterol  inh PRN      Relevant Medications   albuterol  (VENTOLIN  HFA) 108 (90 Base) MCG/ACT inhaler     Musculoskeletal and Integument   Osteoarthritis of left shoulder   Agree with cautious approach to deciding on shoulder joint replacement. We discussed conservative approaches to managing this pain until she reaches a point where surgery becomes imperative.        Other   Morbid obesity with BMI of 40.0-44.9, adult [facility])   Discussed principles of weight management, including a foundation of a healthy, calorie-controlled diet and regular exercise. I recommend regular  exercise weekly. We did discuss the role of medication to augment dietary and exercise efforts. We also discussed metabolic surgery options. She will elt me know what she decides regarding this.         Return in about 4 months (around 01/02/2024) for Reassessment.   Garnette CHRISTELLA Simpler, MD

## 2023-09-01 NOTE — Assessment & Plan Note (Signed)
 Discussed principles of weight management, including a foundation of a healthy, calorie-controlled diet and regular exercise. I recommend regular exercise weekly. We did discuss the role of medication to augment dietary and exercise efforts. We also discussed metabolic surgery options. She will elt me know what she decides regarding this.

## 2023-09-10 ENCOUNTER — Encounter: Payer: Self-pay | Admitting: Family Medicine

## 2023-09-29 ENCOUNTER — Ambulatory Visit: Admitting: Family Medicine

## 2023-09-30 ENCOUNTER — Telehealth: Payer: Self-pay | Admitting: Family Medicine

## 2023-09-30 NOTE — Telephone Encounter (Signed)
 05/02/2023 same day cancellation 09/29/2023 no show  Final warning sent via mail and mychart

## 2023-10-30 DIAGNOSIS — G5621 Lesion of ulnar nerve, right upper limb: Secondary | ICD-10-CM | POA: Insufficient documentation

## 2023-11-06 DIAGNOSIS — D126 Benign neoplasm of colon, unspecified: Secondary | ICD-10-CM | POA: Insufficient documentation

## 2023-11-10 DIAGNOSIS — Z90711 Acquired absence of uterus with remaining cervical stump: Secondary | ICD-10-CM | POA: Insufficient documentation

## 2023-11-14 ENCOUNTER — Telehealth: Payer: Self-pay

## 2023-11-14 DIAGNOSIS — J301 Allergic rhinitis due to pollen: Secondary | ICD-10-CM

## 2023-11-14 MED ORDER — CETIRIZINE HCL 10 MG PO TABS: 10.0000 mg | ORAL_TABLET | Freq: Every day | ORAL | 2 refills | Status: AC

## 2023-11-14 NOTE — Telephone Encounter (Signed)
 Refill to mail order for Cetirizine . Dm/cma

## 2023-12-01 ENCOUNTER — Encounter: Payer: Self-pay | Admitting: Family Medicine

## 2023-12-01 LAB — HM DIABETES EYE EXAM

## 2023-12-03 ENCOUNTER — Telehealth: Payer: Self-pay | Admitting: Neurology

## 2023-12-03 NOTE — Telephone Encounter (Signed)
 Patient called in stated she has been feeling very fatigued and knows this is because her sleep apnea is being untreated she is asking if we can put in a prescription for DME to get another CPAP. Stated when we last saw her in 2023, she did get the CPAP, but never used it and returned it.   Can we write a new script for this or does patient need revisit or new ss?  Please advise, thank you

## 2023-12-16 ENCOUNTER — Encounter: Payer: Self-pay | Admitting: *Deleted

## 2023-12-17 ENCOUNTER — Encounter: Payer: Self-pay | Admitting: Neurology

## 2023-12-17 ENCOUNTER — Ambulatory Visit: Admitting: Neurology

## 2023-12-17 VITALS — BP 120/58 | HR 98 | Ht 64.0 in | Wt 245.8 lb

## 2023-12-17 DIAGNOSIS — R635 Abnormal weight gain: Secondary | ICD-10-CM | POA: Diagnosis not present

## 2023-12-17 DIAGNOSIS — G4733 Obstructive sleep apnea (adult) (pediatric): Secondary | ICD-10-CM | POA: Diagnosis not present

## 2023-12-17 DIAGNOSIS — G4719 Other hypersomnia: Secondary | ICD-10-CM

## 2023-12-17 NOTE — Progress Notes (Signed)
 Subjective:    Patient ID: [name] is a 53 y.o. female.  HPI    True Mar, MD, PhD [facility] [address] [address] [place], [place] [address]  [name] is a 53 year old right-handed woman with an underlying medical history of OSA, history of diverticulitis with colonic perforation in 2016, status post sigmoid colectomy, hypertension, hyperlipidemia, prediabetes, allergies, arthritis, low back pain, knee pain, neck pain, shoulder and hand pain, and severe obesity with a BMI of over 40, who presents for reevaluation of her obstructive sleep apnea.  The patient is unaccompanied today and presents after a longer gap of over 2-1/2 years.  I first met her in March 2023, at which time we talked about sleep apnea concern.  She was advised to proceed with a sleep study.  She had sleep testing in 2023 when I first met her. Her baseline sleep study from 05/23/21 showed severe obstructive sleep apnea, with a total AHI of 32/hour, REM AHI of 86.1/hour, supine AHI of 15/hour and O2 nadir of 69% (during REM sleep).  She had a subsequent CPAP titration study on 06/18/21.   Today, 12/17/2023: She reports snoring and excessive daytime somnolence and worsening sleep apnea symptoms.  Her Epworth sleepiness score is 15 out of 24, fatigue severity score is 30 out of 63.  She does recall feeling better when she had her CPAP titration study but she had a hard time getting used to it at home and eventually gave the machine back.  She has had weight fluctuation.  She lost weight and then gained it again.  She is working on weight loss.  She would be willing to get retested and consider treatment again.  She has looked into inspire but after talking with her today, we mutually agreed to proceed with PAP therapy if she qualifies.  Her mom has a history of sleep apnea.  The patient lives with her husband and 83 year old son.  She works as a engineer, site for the health  department.  She does not smoke cigarettes.  She quit about 13 years ago.  She does not drink any alcohol.  She limits her caffeine to 1 cup of coffee every other day or so, no daily caffeine as such.  She does not have any pets in the household.  Bedtime is generally between 10 and 11 and rise time around 5.  She works from [employer], 4 10-hour shifts. She has had wt fluctuation. Her weight currently is nearly 30 lb higher compared to when she had sleep testing in 2023.  Of note, she is scheduled for right ulnar nerve release surgery in November under Dr. Camella.  Previously (copied from previous notes for reference):   04/26/21: (She) reports snoring and excessive daytime somnolence.  I reviewed your office note from 04/17/2021.  Her Epworth sleepiness score is 12/24, fatigue severity score is 25 out of 63.  She lives at home with her husband and 84 year old son, she also has a 41 year old son.  She does not work.  She goes to bed around 830 and rise time is around 5 AM.  She has nocturia about 3 times per average night and has woken up at night with palpitations.  She has occasional morning headaches which is described as a pressure-like sensation, not really painful, in the bifrontal and bitemporal area, does not typically take anything for it.  Her mom has sleep apnea and has a CPAP machine and her older son had sleep  apnea and had his adenoids out when he was in middle school.  She has a TV in the bedroom and does have a tendency to have it on at night, her husband likes to watch it.  She has been using decaf coffee, she drinks about 2 bottles of soda per day.  She quit smoking some 10 years ago and does not drink any alcohol.  Of note, she had COVID in August 2021 and had to be hospitalized with COVID pneumonia.  She went home with oxygen.  She since then has been able to discontinue her home oxygen.    Her Past Medical History Is Significant For: Past Medical History:  Diagnosis Date   Abnormal Pap smear  2000   [facility] Biopsy   Acute respiratory failure with hypoxia (HCC) 10/19/2019   Allergy    Arthritis    LEFT knee   Back pain    Blood transfusion without reported diagnosis 2005   Breast fibroadenoma    Diverticulitis of colon with perforation 05/20/2014   GERD (gastroesophageal reflux disease)    H/O metrorrhagia 12/2006   H/O myomectomy 07/2006   robotic   H/O sinusitis    Headache(784.0)    Heart murmur    dx age [age]- no issues currently   History of conization of cervix 2000   History of ectopic pregnancy 2005   r salpyngectomy   History of hysterectomy, supracervical 08/2008   robotic   History of syphilis    Hx of adenomatous polyp of colon 12/31/2017   Hx of herpes simplex type 2 infection    Hyperlipidemia    on meds   Hypertension    on meds   Ileostomy present (HCC) 10/26/2014   reversed in 10/2014   Intra-abdominal abscess (HCC) 07/21/2014   PMS (premenstrual syndrome)    Seasonal allergies    Sleep apnea    does not use CPAP    Her Past Surgical History Is Significant For: Past Surgical History:  Procedure Laterality Date   ABDOMINAL HYSTERECTOMY     BREAST LUMPECTOMY  11/2009   rt-neg   BREAST SURGERY  2004   fibroademoma   CERVICAL CONE BIOPSY  2000   COLON RESECTION N/A 07/06/2014   Procedure: LAPAROSCOPIC LOOP ILEOSTOMY WITH PLACEMENT OF PELVIC DRAIN;  Surgeon: Donnice Lima, MD;  Location: [facility] OR;  Service: General;  Laterality: N/A;   COLON SURGERY  10/2014   reversal of ileostomy   COLONOSCOPY  12/2018   GM-MAC-suprep(good)-SSPx 5, end to end colonic anastomosis sigmoidcolon   DILATION AND CURETTAGE OF UTERUS  2009   DIVERTING ILEOSTOMY Right 06/2014   ECTOPIC PREGNANCY SURGERY  2005   R salpyngectomy   ILEOSTOMY N/A 10/26/2014   Procedure: LOOP ILEOSTOMY REVERSAL;  Surgeon: Donnice Lima, MD;  Location: [facility] OR;  Service: General;  Laterality: N/A;   LAPAROSCOPIC SIGMOID COLECTOMY N/A 06/30/2014   Procedure: LAPAROSCOPIC ASSISTED SIGMOID  COLECTOMY;  Surgeon: Donnice Lima, MD;  Location: [facility] OR;  Service: General;  Laterality: N/A;   MYOMECTOMY  2008   robotic   POLYPECTOMY  2009   POLYPECTOMY  2020   SSP x 5   ROBOTIC ASSISTED LAP VAGINAL HYSTERECTOMY  2010   supracervical   SALPINGECTOMY Right 2005   ectopic pregnancy-RIGHT tube removed   TRIGGER FINGER RELEASE Right 09/28/2012   Procedure: RELEASE TRIGGER FINGER/A-1 PULLEY RIGHT THUMB;  Surgeon: Alm DELENA Hummer, MD;  Location: [facility];  Service: Orthopedics;  Laterality: Right;   ULNAR NERVE TRANSPOSITION  Right 12/01/2018   Procedure: ULNAR NERVE DECOMPRESSION;  Surgeon: Murrell Kuba, MD;  Location: [facility];  Service: Orthopedics;  Laterality: Right;  AXILLARY   WISDOM TOOTH EXTRACTION  2022    Her Family History Is Significant For: Family History  Problem Relation Age of Onset   Sarcoidosis Mother    Hypertension Mother    Hyperlipidemia Mother    Sleep apnea Mother    Prostate cancer Father 61       radiation seed   Fibroids Sister    Thyroid  disease Maternal Aunt    Heart disease Paternal Uncle    Hypertension Maternal Grandmother    Hypertension Paternal Grandmother    Diabetes Paternal Grandmother    Mental illness Cousin    Colon cancer Neg Hx    Colon polyps Neg Hx    Esophageal cancer Neg Hx    Rectal cancer Neg Hx    Stomach cancer Neg Hx     Her Social History Is Significant For: Social History   Socioeconomic History   Marital status: Married    Spouse name: [name]   Number of children: 2   Years of education: Not on file   Highest education level: Associate degree: occupational, scientist, product/process development, or vocational program  Occupational History   Occupation: CMA    Comment: Primary Care at [employer]  Tobacco Use   Smoking status: Former    Current packs/day: 0.00    Average packs/day: 0.5 packs/day for 20.0 years (10.0 ttl pk-yrs)    Types: Cigarettes    Start date: 06/13/1991    Quit date: 06/13/2011    Years  since quitting: 12.5   Smokeless tobacco: Never  Vaping Use   Vaping status: Never Used  Substance and Sexual Activity   Alcohol use: Not Currently   Drug use: No   Sexual activity: Yes    Birth control/protection: None  Other Topics Concern   Not on file  Social History Narrative   Lives with her husband and their son.   Social Drivers of Corporate Investment Banker Strain: Not on file  Food Insecurity: No Food Insecurity (04/27/2023)   Hunger Vital Sign    Worried About Running Out of Food in the Last Year: Never true    Ran Out of Food in the Last Year: Never true  Transportation Needs: No Transportation Needs (04/27/2023)   PRAPARE - Administrator, Civil Service (Medical): No    Lack of Transportation (Non-Medical): No  Physical Activity: Not on file  Stress: Not on file  Social Connections: Not on file    Her Allergies Are:  Allergies  Allergen Reactions   Keflex [Cephalexin] Anaphylaxis   Penicillins Anaphylaxis    Has patient had a PCN reaction causing immediate rash, facial/tongue/throat swelling, SOB or lightheadedness with hypotension: Yes Has patient had a PCN reaction causing severe rash involving mucus membranes or skin necrosis: No Has patient had a PCN reaction that required hospitalization: No Has patient had a PCN reaction occurring within the last 10 years: No If all of the above answers are NO, then may proceed with Cephalosporin use.    Prednisone  Other (See Comments)    It makes me feel like I am out of my body  :   Her Current Medications Are:  Outpatient Encounter Medications as of 12/17/2023  Medication Sig   albuterol  (VENTOLIN  HFA) 108 (90 Base) MCG/ACT inhaler Inhale 1-2 puffs into the lungs every 6 (six) hours as needed for  wheezing or shortness of breath.   amLODipine  (NORVASC ) 5 MG tablet TAKE 1 TABLET BY MOUTH DAILY   atorvastatin  (LIPITOR) 20 MG tablet TAKE 1 TABLET BY MOUTH ONCE  DAILY   cetirizine  (ZYRTEC ) 10 MG tablet  Take 1 tablet (10 mg total) by mouth daily. (Patient taking differently: Take 10 mg by mouth as needed.)   losartan  (COZAAR ) 50 MG tablet TAKE 1 TABLET BY MOUTH DAILY   Multiple Vitamins-Minerals (HAIR/SKIN/NAILS/BIOTIN  PO) Take 3 tablets by mouth daily.   UNABLE TO FIND Med Name: Remifemin (black cohosh )   No facility-administered encounter medications on file as of 12/17/2023.  :   Review of Systems:  Out of a complete 14 point review of systems, all are reviewed and negative with the exception of these symptoms as listed below:  Review of Systems  Objective:  Neurological Exam  Physical Exam Physical Examination:   Vitals:   12/17/23 1412  BP: (!) 120/58  Pulse: 98    General Examination: The patient is a very pleasant 52 y.o. female in no acute distress. She appears well-developed and well-nourished and well groomed.   HEENT: Normocephalic, atraumatic, pupils are equal, round and reactive to light, extraocular tracking is good without limitation to gaze excursion or nystagmus noted. No photophobia. Corrective eye glasses in place. Hearing is grossly intact.  Face is symmetric with normal facial animation. Speech is clear without dysarthria. There is no hypophonia. There is no lip, neck/head, jaw or voice tremor. Neck is supple with full range of passive and active motion. There are no carotid bruits on auscultation.  Airway/Oropharynx exam reveals: mild mouth dryness, good dental hygiene and moderate airway crowding, due to small airway entry, Mallampati class III, redundant soft palate, slightly wider uvula.  Neck circumference 18-3/8 inches.  Tongue protrudes centrally and palate elevates symmetrically.  Chest: Clear to auscultation without wheezing, rhonchi or crackles noted.  Heart: S1+S2+0, regular and normal without murmurs, rubs or gallops noted.   Abdomen: Soft, non-tender and non-distended.  Extremities: There is nonpitting puffiness in the ankle area bilaterally.      Skin: Warm and dry without trophic changes noted.   Musculoskeletal: exam reveals no obvious joint deformities.   Neurologically:  Mental status: The patient is awake, alert and oriented in all 4 spheres. Her immediate and remote memory, attention, language skills and fund of knowledge are appropriate. There is no evidence of aphasia, agnosia, apraxia or anomia. Speech is clear with normal prosody and enunciation. Thought process is linear. Mood is normal and affect is normal.  Cranial nerves II - XII are as described above under HEENT exam.  Motor exam: Normal bulk, moving all 4 extremities without restriction, no obvious action or resting tremor.  Fine motor skills and coordination: Intact grossly.  Cerebellar testing: No dysmetria or intention tremor. There is no truncal or gait ataxia.  Sensory exam: intact to light touch in the upper and lower extremities.  Gait, station and balance: She stands easily. No veering to one side is noted. No leaning to one side is noted. Posture is age-appropriate and stance is narrow based. Gait shows normal stride length and normal pace. No problems turning are noted.   Assessment and plan:  In summary, [name] is a very pleasant 53 year old right-handed woman with an underlying medical history of OSA, history of diverticulitis with colonic perforation in 2016, status post sigmoid colectomy, hypertension, hyperlipidemia, prediabetes, allergies, arthritis, low back pain, knee pain, neck pain, shoulder and hand pain, and severe  obesity with a BMI of over 40, who presents for reevaluation of her obstructive sleep apnea.  She was diagnosed with severe obstructive sleep apnea in early 2023 but has not been on PAP therapy for over 2 years.  She has had weight fluctuation, current weight nearly 30 pounds higher compared to 2023.  She is willing to get reevaluated and start treatment again.  I had a long discussion with the patient about my findings and the  diagnosis of sleep apnea, particularly OSA, its prognosis and treatment options. We talked about medical/conservative treatments, surgical interventions and non-pharmacological approaches for symptom control. I explained, in particular, the risks and ramifications of untreated moderate to severe OSA, especially with respect to developing cardiovascular disease down the road, including congestive heart failure (CHF), difficult to treat hypertension, cardiac arrhythmias (particularly A-fib), neurovascular complications including TIA, stroke and dementia. Even type 2 diabetes has, in part, been linked to untreated OSA. Symptoms of untreated OSA may include (but may not be limited to) daytime sleepiness, nocturia (i.e. frequent nighttime urination), memory problems, mood irritability and suboptimally controlled or worsening mood disorder such as depression and/or anxiety, lack of energy, lack of motivation, physical discomfort, as well as recurrent headaches, especially morning or nocturnal headaches. We talked about the importance of maintaining a healthy lifestyle and striving for healthy weight.  We mutually agreed to proceed with a home sleep test at this time. I outlined possible surgical and non-surgical treatment options of OSA, including the use of a positive airway pressure (PAP) device (i.e. CPAP, AutoPAP/APAP or BiPAP in certain circumstances), a custom-made dental device (aka oral appliance, which would require a referral to a specialist dentist or orthodontist typically, and is generally speaking not considered for patients with full dentures or edentulous state), upper airway surgical options, such as traditional UPPP (which is not considered a first-line treatment) or the Inspire device (hypoglossal nerve stimulator, which would involve a referral for consultation with an ENT surgeon, after careful selection, following inclusion criteria - also not first-line treatment). I explained the PAP treatment  option to the patient in detail, as this is generally considered first-line treatment.  The patient indicated that she would be willing to try PAP therapy, if the need arises. I explained the importance of being compliant with PAP treatment, not only for insurance purposes but primarily to improve patient's symptoms symptoms, and for the patient's long term health benefit, including to reduce Her cardiovascular risks longer-term.    We will pick up our discussion about the next steps and treatment options after testing.  We will keep her posted as to the test results by phone call and/or MyChart messaging where possible.  We will plan to follow-up in sleep clinic accordingly as well.  I answered all her questions today and the patient was in agreement.   I encouraged her to call with any interim questions, concerns, problems or updates or email us  through MyChart.  Generally speaking, sleep test authorizations may take up to 2 weeks, sometimes less, sometimes longer, the patient is encouraged to get in touch with us  if they do not hear back from the sleep lab staff directly within the next 2 weeks. I spent 40 minutes in total face-to-face time and in reviewing records during pre-charting, more than 50% of which was spent in counseling and coordination of care, reviewing test results, reviewing medications and treatment regimen and/or in discussing or reviewing the diagnosis of OSA, the prognosis and treatment options. Pertinent laboratory and imaging test results  that were available during this visit with the patient were reviewed by me and considered in my medical decision making (see chart for details).

## 2023-12-28 NOTE — Assessment & Plan Note (Signed)
 Discussed principles of weight management, including a foundation of a healthy, calorie-controlled diet and regular exercise. I recommend regular exercise weekly. We did discuss the role of medication to augment dietary and exercise efforts. We also discussed metabolic surgery options. She will elt me know what she decides regarding this.

## 2023-12-28 NOTE — Assessment & Plan Note (Signed)
 Blood pressure is at goal. Continue amlodipine  5 mg daily and losartan  50 mg daily.

## 2023-12-28 NOTE — Assessment & Plan Note (Signed)
 Continue albuterol  inh PRN

## 2023-12-28 NOTE — Progress Notes (Signed)
 [facility] [address] [place] [address] Dept: [phone] Dept Fax: [phone]  Chronic Care Office Visit  Subjective:    Patient ID: [name], female    DOB: Aug 05, 1970, 53 y.o..   MRN: [id number]  No chief complaint on file.  History of Present Illness:  Patient is in today for reassessment of chronic medical conditions.   Ms. Arkin has a history of hypertension, managed with amlodipine  5 mg daily and losartan  50 mg daily.    Ms. Maclaren has hyperlipidemia. She is managed on atorvastatin  20 mg daily.   Ms. Coonan has a history of left shoulder glenohumeral arthritis. She notes the orthopedist has recommended a total shoulder joint replacement. She has some occasional pain and limited ROM. She does not feel ready to commit to shoulder surgery at this point.   Ms. Fishbaugh has concern for her weight. She is finding her home efforts are not enough to achieve weight loss. She is interested in discussing both surgical and medication approaches.  Past Medical History: Patient Active Problem List   Diagnosis Date Noted   Osteoarthritis of left shoulder 09/01/2023   Urinary incontinence, mixed 05/16/2023   Rectocele 05/16/2023   Left carpal tunnel syndrome 01/27/2023   Nocturnal cough 11/11/2022   Palpitations 11/11/2022   Ganglion cyst of finger of left hand 02/25/2022   Hypertensive retinopathy of right eye 11/19/2021   Open angle with borderline findings and low glaucoma risk in both eyes 11/19/2021   Obstructive sleep apnea 06/10/2021   Degenerative joint disease (DJD) of lumbar spine 04/17/2021   Derangement of meniscus of left knee 04/17/2021   Osteoarthritis of left hip 04/17/2021   Centrilobular emphysema (HCC) 06/29/2020   Aortic atherosclerosis 06/29/2020   Mixed hyperlipidemia 10/19/2019   History of diverticulitis 11/05/2018   Entrapment of left ulnar nerve at elbow 10/19/2018   Prediabetes  10/08/2018   Morbid obesity with BMI of 40.0-44.9, adult (HCC) 03/29/2018   Nonspecific abnormal electrocardiogram (ECG) (EKG) 03/27/2018   Atypical chest pain 03/27/2018   Trigger ring finger of right hand 08/07/2015   Primary osteoarthritis of first carpometacarpal joint of right hand 08/07/2015   Diverticulitis of sigmoid colon s/p colectomy 06/30/2014 07/26/2014   Abdominal pain    Hx of adenomatous polyp of colon 12/23/2013   Essential hypertension 11/02/2013   AKI (acute kidney injury) 11/02/2013   Former smoker 11/26/2008   Past Surgical History:  Procedure Laterality Date   ABDOMINAL HYSTERECTOMY     BREAST LUMPECTOMY  11/2009   rt-neg   BREAST SURGERY  2004   fibroademoma   CERVICAL CONE BIOPSY  2000   COLON RESECTION N/A 07/06/2014   Procedure: LAPAROSCOPIC LOOP ILEOSTOMY WITH PLACEMENT OF PELVIC DRAIN;  Surgeon: Donnice Lima, MD;  Location: [facility] OR;  Service: General;  Laterality: N/A;   COLON SURGERY  10/2014   reversal of ileostomy   COLONOSCOPY  12/2018   GM-MAC-suprep(good)-SSPx 5, end to end colonic anastomosis sigmoidcolon   DILATION AND CURETTAGE OF UTERUS  2009   DIVERTING ILEOSTOMY Right 06/2014   ECTOPIC PREGNANCY SURGERY  2005   R salpyngectomy   ILEOSTOMY N/A 10/26/2014   Procedure: LOOP ILEOSTOMY REVERSAL;  Surgeon: Donnice Lima, MD;  Location: [facility] OR;  Service: General;  Laterality: N/A;   LAPAROSCOPIC SIGMOID COLECTOMY N/A 06/30/2014   Procedure: LAPAROSCOPIC ASSISTED SIGMOID COLECTOMY;  Surgeon: Donnice Lima, MD;  Location: [facility] OR;  Service: General;  Laterality: N/A;   MYOMECTOMY  2008  robotic   POLYPECTOMY  2009   POLYPECTOMY  2020   SSP x 5   ROBOTIC ASSISTED LAP VAGINAL HYSTERECTOMY  2010   supracervical   SALPINGECTOMY Right 2005   ectopic pregnancy-RIGHT tube removed   TRIGGER FINGER RELEASE Right 09/28/2012   Procedure: RELEASE TRIGGER FINGER/A-1 PULLEY RIGHT THUMB;  Surgeon: Alm DELENA Hummer, MD;  Location: [facility];   Service: Orthopedics;  Laterality: Right;   ULNAR NERVE TRANSPOSITION Right 12/01/2018   Procedure: ULNAR NERVE DECOMPRESSION;  Surgeon: Murrell Kuba, MD;  Location: [facility];  Service: Orthopedics;  Laterality: Right;  AXILLARY   WISDOM TOOTH EXTRACTION  2022   Family History  Problem Relation Age of Onset   Sarcoidosis Mother    Hypertension Mother    Hyperlipidemia Mother    Sleep apnea Mother    Prostate cancer Father 63       radiation seed   Fibroids Sister    Thyroid  disease Maternal Aunt    Heart disease Paternal Uncle    Hypertension Maternal Grandmother    Hypertension Paternal Grandmother    Diabetes Paternal Grandmother    Mental illness Cousin    Colon cancer Neg Hx    Colon polyps Neg Hx    Esophageal cancer Neg Hx    Rectal cancer Neg Hx    Stomach cancer Neg Hx    Outpatient Medications Prior to Visit  Medication Sig Dispense Refill   albuterol  (VENTOLIN  HFA) 108 (90 Base) MCG/ACT inhaler Inhale 1-2 puffs into the lungs every 6 (six) hours as needed for wheezing or shortness of breath. 8 g 0   amLODipine  (NORVASC ) 5 MG tablet TAKE 1 TABLET BY MOUTH DAILY 90 tablet 3   atorvastatin  (LIPITOR) 20 MG tablet TAKE 1 TABLET BY MOUTH ONCE  DAILY 90 tablet 3   cetirizine  (ZYRTEC ) 10 MG tablet Take 1 tablet (10 mg total) by mouth daily. (Patient taking differently: Take 10 mg by mouth as needed.) 90 tablet 2   losartan  (COZAAR ) 50 MG tablet TAKE 1 TABLET BY MOUTH DAILY 90 tablet 3   Multiple Vitamins-Minerals (HAIR/SKIN/NAILS/BIOTIN  PO) Take 3 tablets by mouth daily.     UNABLE TO FIND Med Name: Remifemin (black cohosh )     No facility-administered medications prior to visit.   Allergies  Allergen Reactions   Keflex [Cephalexin] Anaphylaxis   Penicillins Anaphylaxis    Has patient had a PCN reaction causing immediate rash, facial/tongue/throat swelling, SOB or lightheadedness with hypotension: Yes Has patient had a PCN reaction causing severe rash  involving mucus membranes or skin necrosis: No Has patient had a PCN reaction that required hospitalization: No Has patient had a PCN reaction occurring within the last 10 years: No If all of the above answers are NO, then may proceed with Cephalosporin use.    Prednisone  Other (See Comments)    It makes me feel like I am out of my body     Objective:   There were no vitals filed for this visit. There is no height or weight on file to calculate BMI.   General: Well developed, well nourished. No acute distress. HEENT: Normocephalic, non-traumatic. PERRL, EOMI. Conjunctiva clear. External ears normal. EAC and TMs normal bilaterally. Nose    clear without congestion or rhinorrhea. Mucous membranes moist. Oropharynx clear. Good dentition. Neck: Supple. No lymphadenopathy. No thyromegaly. Lungs: Clear to auscultation bilaterally. No wheezing, rales or rhonchi. CV: RRR without murmurs or rubs. Pulses 2+ bilaterally. Abdomen: Soft, non-tender. Bowel sounds  positive, normal pitch and frequency. No hepatosplenomegaly. No rebound or guarding. Back: Straight. No CVA tenderness bilaterally. Extremities: Full ROM. No joint swelling or tenderness. No edema noted. Skin: Warm and dry. No rashes. Neuro: CN II-XII intact. Normal sensation and DTR bilaterally. Psych: Alert and oriented. Normal mood and affect.  Health Maintenance Due  Topic Date Due   Pneumococcal Vaccine: 50+ Years (1 of 2 - PCV) Never done   Hepatitis B Vaccines 19-59 Average Risk (3 of 3 - 19+ 3-dose series) 01/20/2001   Cervical Cancer Screening (HPV/Pap Cotest)  07/05/2023   Mammogram  07/25/2023   Influenza Vaccine  09/19/2023   COVID-19 Vaccine (5 - 2025-26 season) 10/20/2023   Lab Results Last lipids Lab Results  Component Value Date   CHOL 183 04/01/2022   HDL 64.70 04/01/2022   LDLCALC 104 (H) 04/01/2022   LDLDIRECT 92.0 10/04/2019   TRIG 72.0 04/01/2022   CHOLHDL 3 04/01/2022      Assessment & Plan:    Problem List Items Addressed This Visit   None   No follow-ups on file.   Garnette CHRISTELLA Simpler, MD  I,Emily Lagle,acting as a scribe for Garnette CHRISTELLA Simpler, MD.,have documented all relevant documentation on the behalf of Garnette CHRISTELLA Simpler, MD.  I, Garnette CHRISTELLA Simpler, MD, have reviewed all documentation for this visit. The documentation on 12/29/2023 for the exam, diagnosis, procedures, and orders are all accurate and complete.

## 2023-12-29 ENCOUNTER — Encounter: Payer: Self-pay | Admitting: Family Medicine

## 2023-12-29 ENCOUNTER — Ambulatory Visit: Payer: Self-pay | Admitting: Family Medicine

## 2023-12-29 ENCOUNTER — Ambulatory Visit: Admitting: Family Medicine

## 2023-12-29 VITALS — BP 134/82 | HR 88 | Temp 97.6°F | Ht 64.0 in | Wt 241.8 lb

## 2023-12-29 DIAGNOSIS — Z6841 Body Mass Index (BMI) 40.0 and over, adult: Secondary | ICD-10-CM | POA: Diagnosis not present

## 2023-12-29 DIAGNOSIS — I10 Essential (primary) hypertension: Secondary | ICD-10-CM

## 2023-12-29 DIAGNOSIS — K089 Disorder of teeth and supporting structures, unspecified: Secondary | ICD-10-CM | POA: Diagnosis not present

## 2023-12-29 DIAGNOSIS — J432 Centrilobular emphysema: Secondary | ICD-10-CM

## 2023-12-29 DIAGNOSIS — M19012 Primary osteoarthritis, left shoulder: Secondary | ICD-10-CM

## 2023-12-29 LAB — VITAMIN D 25 HYDROXY (VIT D DEFICIENCY, FRACTURES): VITD: 37.37 ng/mL (ref 30.00–100.00)

## 2023-12-29 MED ORDER — ZEPBOUND 2.5 MG/0.5ML ~~LOC~~ SOAJ: 2.5000 mg | SUBCUTANEOUS | 0 refills | Status: AC

## 2023-12-29 MED ORDER — ZEPBOUND 5 MG/0.5ML ~~LOC~~ SOAJ: 5.0000 mg | SUBCUTANEOUS | 0 refills | Status: AC

## 2024-01-01 ENCOUNTER — Telehealth: Payer: Self-pay

## 2024-01-01 NOTE — Telephone Encounter (Signed)
 Pt called in and wanted to make sure it was noted that she does not want to use [employer] for DME this time, please send orders to a different DME

## 2024-01-02 ENCOUNTER — Encounter: Payer: Self-pay | Admitting: Family Medicine

## 2024-01-07 ENCOUNTER — Other Ambulatory Visit: Payer: Self-pay | Admitting: Family Medicine

## 2024-01-07 ENCOUNTER — Ambulatory Visit: Admitting: Neurology

## 2024-01-07 DIAGNOSIS — G4733 Obstructive sleep apnea (adult) (pediatric): Secondary | ICD-10-CM | POA: Diagnosis not present

## 2024-01-07 DIAGNOSIS — R635 Abnormal weight gain: Secondary | ICD-10-CM

## 2024-01-07 DIAGNOSIS — G4734 Idiopathic sleep related nonobstructive alveolar hypoventilation: Secondary | ICD-10-CM

## 2024-01-07 DIAGNOSIS — I10 Essential (primary) hypertension: Secondary | ICD-10-CM

## 2024-01-07 DIAGNOSIS — G4719 Other hypersomnia: Secondary | ICD-10-CM

## 2024-01-08 NOTE — Progress Notes (Signed)
 See procedure note.

## 2024-01-09 ENCOUNTER — Ambulatory Visit: Payer: Self-pay | Admitting: Neurology

## 2024-01-09 DIAGNOSIS — G4734 Idiopathic sleep related nonobstructive alveolar hypoventilation: Secondary | ICD-10-CM

## 2024-01-09 DIAGNOSIS — G4733 Obstructive sleep apnea (adult) (pediatric): Secondary | ICD-10-CM

## 2024-01-09 NOTE — Procedures (Signed)
 [facility]  HOME SLEEP TEST (Watch PAT) REPORT  STUDY DATE: 01/06/24  DOB: Jul 05, 1970  MRN: [id number]  ORDERING CLINICIAN: True Mar, MD, PhD   REFERRING CLINICIAN: Thedora Garnette HERO, MD   CLINICAL INFORMATION/HISTORY (obtained from visit note dated 12/17/2023): 53 year old right-handed [name] with an underlying medical history of OSA, history of diverticulitis with colonic perforation in 2016, status post sigmoid colectomy, hypertension, hyperlipidemia, prediabetes, allergies, arthritis, low back pain, knee pain, neck pain, shoulder and hand pain, and severe obesity with a BMI of over 40, who presents for reevaluation of her obstructive sleep apnea.   Epworth sleepiness score: 15/24.  BMI: 42.2 kg/m  FINDINGS:   Sleep Summary:   Total Recording Time (hours, min): 7 hours, 28 min  Total Sleep Time (hours, min):  6 hours, 36 min  Percent REM (%):    15.7%   Respiratory Indices:   Calculated pAHI (per hour):  60.2/hour         REM pAHI:    61.5/hour       NREM pAHI: 59.9/hour  Central pAHI: 5.5/hour  Oxygen Saturation Statistics:    Oxygen Saturation (%) Mean: 88%   Minimum oxygen saturation (%):                 59%   O2 Saturation Range (%): 59-98%    O2 Saturation (minutes) <=88%: 115.5 min  Pulse Rate Statistics:   Pulse Mean (bpm):    89/min    Pulse Range (60-118/min)   IMPRESSION:  OSA (obstructive sleep apnea), severe Nocturnal Hypoxemia  RECOMMENDATION:  This home sleep test demonstrates severe obstructive sleep apnea with a total AHI of 60.2/hour and O2 nadir of 59% with significant time below or at 88% saturation of over 115 minutes for the study, indicating nocturnal hypoxemia.  Her baseline oxygen saturation was only 88%.  Snoring was detected, in the mild to loud range.  Treatment with positive airway pressure is highly recommended. The patient will be advised to proceed with an autoPAP titration/trial at home. A  laboratory attended titration study can be considered in the future for optimization of treatment settings and to improve tolerance and compliance, if needed, down the road. Alternative treatment options are limited secondary to the severity of the patient's sleep disordered breathing, but may include surgical treatment with an implantable hypoglossal nerve stimulator (in carefully selected candidates, meeting criteria).  Concomitant weight loss is recommended (where clinically appropriate). Please note, that untreated obstructive sleep apnea may carry additional perioperative morbidity. Patients with significant obstructive sleep apnea should receive perioperative PAP therapy and the surgeons and particularly the anesthesiologist should be informed of the diagnosis and the severity of the sleep disordered breathing. The patient should be cautioned not to drive, work at heights, or operate dangerous or heavy equipment when tired or sleepy. Review and reiteration of good sleep hygiene measures should be pursued with any patient. Other causes of the patient's symptoms, including circadian rhythm disturbances, an underlying mood disorder, medication effect and/or an underlying medical problem cannot be ruled out based on this test. Clinical correlation is recommended.  The patient and her referring provider will be notified of the test results. The patient will be seen in follow up in sleep clinic at [facility].  I certify that I have reviewed the raw data recording prior to the issuance of this report in accordance with the standards of the American Academy of Sleep Medicine (AASM).    INTERPRETING PHYSICIAN:   True Mar, MD, PhD Medical  Director, [facility]) Diplomat, ABPN (Neurology and Sleep)   [facility] [address] [place], [place] [address] [phone]

## 2024-01-13 NOTE — Telephone Encounter (Signed)
-----   Message from True Mar sent at 01/09/2024  1:27 PM EST ----- Urgent set up requested on PAP therapy, due to severe OSA. Patient was recently seen in follow-up for her sleep apnea, she is no longer on PAP therapy.  She had a home sleep test which confirmed severe obstructive sleep apnea and I would like for her to  start home AutoPap therapy as soon as possible.  Her average oxygen saturation was only 88% and she had severe desaturations.  I would like for her to work very closely with her providers regarding  weight loss.   We will send the AutoPap order to her previous DME company if she prefers or a new DME company if she would like.  Please also reinforce the need for compliance with treatment. We will need a FU in sleep clinic for 10 weeks post-PAP set up, please arrange that with me or one of our NPs. Thanks,   True Mar, MD, PhD [facility])    ----- Message ----- From: Mar True, MD Sent: 01/09/2024   1:24 PM EST To: True Mar, MD

## 2024-01-13 NOTE — Telephone Encounter (Signed)
 Spoke with patient and discussed sleep study results as noted below. Patient verbalized understanding and agreement to proceed with urgent setup on autopap and to work closely with other providers for weight loss. Patient does not wish to use [employer] but she is ok with [employer]. She will watch for a call from them soon and will follow-up if she has not received a call within 1 week. Pt aware of insurance compliance requirements which includes using the machine at least 4 hours at night and also being seen by our office within 30 to 90 days after setup. Pt verbalized appreciation for the call.   Urgent order sent to [facility].

## 2024-01-14 NOTE — Telephone Encounter (Signed)
 Rumalda Sonny Boring, Heather CROME, RN; [place], [place]; Nickola Stabs; Able, Jada Pulled urgent pap order and will have patient contacted.  Thank you!  Sonny

## 2024-01-21 ENCOUNTER — Encounter: Payer: Self-pay | Admitting: Family Medicine

## 2024-01-21 ENCOUNTER — Telehealth: Payer: Self-pay

## 2024-01-21 ENCOUNTER — Other Ambulatory Visit (HOSPITAL_COMMUNITY): Payer: Self-pay

## 2024-01-21 NOTE — Telephone Encounter (Signed)
 PA request has been Submitted. New Encounter has been or will be created for follow up. For additional info see Pharmacy Prior Auth telephone encounter from 01/21/2024.

## 2024-01-21 NOTE — Telephone Encounter (Signed)
 Pharmacy Patient Advocate Encounter   Received notification from Physician's Office that prior authorization for Zepbound  2.5MG /0.5ML pen-injectors  is required/requested.   Insurance verification completed.   The patient is insured through [employer].   Per test claim: PA required; PA submitted to above mentioned insurance via Latent Key/confirmation #/EOC [id number] Status is pending

## 2024-01-21 NOTE — Telephone Encounter (Signed)
 Can you please and thank you do a PA for the Zepbound ? Thanks. Dm/cma

## 2024-01-29 NOTE — Telephone Encounter (Signed)
 Pharmacy Patient Advocate Encounter  Received notification from [employer] that Prior Authorization for Zepbound  2.5MG /0.5ML pen-injectors has been DENIED.  Full denial letter will be uploaded to the media tab. See denial reason below.  Non-formulary Zepbound  only covered for OSA with failed diet and CPAP intolerance   PA #/Case ID/Reference #: [id number]

## 2024-02-02 ENCOUNTER — Emergency Department (HOSPITAL_COMMUNITY)
Admission: EM | Admit: 2024-02-02 | Discharge: 2024-02-02 | Disposition: A | Discharge status: Home / Self Care | Attending: Emergency Medicine | Admitting: Emergency Medicine

## 2024-02-02 ENCOUNTER — Emergency Department (HOSPITAL_COMMUNITY)

## 2024-02-02 ENCOUNTER — Encounter (HOSPITAL_COMMUNITY): Payer: Self-pay | Admitting: Emergency Medicine

## 2024-02-02 ENCOUNTER — Other Ambulatory Visit: Payer: Self-pay

## 2024-02-02 DIAGNOSIS — I10 Essential (primary) hypertension: Secondary | ICD-10-CM | POA: Diagnosis not present

## 2024-02-02 DIAGNOSIS — R1032 Left lower quadrant pain: Secondary | ICD-10-CM | POA: Diagnosis present

## 2024-02-02 DIAGNOSIS — Z79899 Other long term (current) drug therapy: Secondary | ICD-10-CM | POA: Diagnosis not present

## 2024-02-02 DIAGNOSIS — K5732 Diverticulitis of large intestine without perforation or abscess without bleeding: Secondary | ICD-10-CM | POA: Diagnosis not present

## 2024-02-02 DIAGNOSIS — K5792 Diverticulitis of intestine, part unspecified, without perforation or abscess without bleeding: Secondary | ICD-10-CM

## 2024-02-02 LAB — CBC
HCT: 39.9 % (ref 36.0–46.0)
Hemoglobin: 12.8 g/dL (ref 12.0–15.0)
MCH: 29.1 pg (ref 26.0–34.0)
MCHC: 32.1 g/dL (ref 30.0–36.0)
MCV: 90.7 fL (ref 80.0–100.0)
Platelets: 327 K/uL (ref 150–400)
RBC: 4.4 MIL/uL (ref 3.87–5.11)
RDW: 13 % (ref 11.5–15.5)
WBC: 11.8 K/uL — ABNORMAL HIGH (ref 4.0–10.5)
nRBC: 0 % (ref 0.0–0.2)

## 2024-02-02 LAB — COMPREHENSIVE METABOLIC PANEL WITH GFR
ALT: 26 U/L (ref 0–44)
AST: 26 U/L (ref 15–41)
Albumin: 3.8 g/dL (ref 3.5–5.0)
Alkaline Phosphatase: 52 U/L (ref 38–126)
Anion gap: 16 — ABNORMAL HIGH (ref 5–15)
BUN: 8 mg/dL (ref 6–20)
CO2: 20 mmol/L — ABNORMAL LOW (ref 22–32)
Calcium: 9.3 mg/dL (ref 8.9–10.3)
Chloride: 105 mmol/L (ref 98–111)
Creatinine, Ser: 0.7 mg/dL (ref 0.44–1.00)
GFR, Estimated: >60 mL/min (ref >60)
Glucose, Bld: 109 mg/dL — ABNORMAL HIGH (ref 70–99)
Potassium: 3.6 mmol/L (ref 3.5–5.1)
Sodium: 141 mmol/L (ref 135–145)
Total Bilirubin: 0.8 mg/dL (ref 0.0–1.2)
Total Protein: 7.2 g/dL (ref 6.5–8.1)

## 2024-02-02 LAB — URINALYSIS, ROUTINE W REFLEX MICROSCOPIC
Bilirubin Urine: NEGATIVE
Glucose, UA: NEGATIVE mg/dL
Hgb urine dipstick: NEGATIVE
Ketones, ur: NEGATIVE mg/dL
Leukocytes,Ua: NEGATIVE
Nitrite: NEGATIVE
Protein, ur: NEGATIVE mg/dL
Specific Gravity, Urine: 1.013 (ref 1.005–1.030)
pH: 6 (ref 5.0–8.0)

## 2024-02-02 LAB — LIPASE, BLOOD: Lipase: 39 U/L (ref 11–51)

## 2024-02-02 MED ORDER — KETOROLAC TROMETHAMINE 15 MG/ML IJ SOLN: 15.0000 mg | Freq: Once | INTRAMUSCULAR | Status: DC

## 2024-02-02 MED ORDER — CIPROFLOXACIN HCL 500 MG PO TABS: 500.0000 mg | ORAL_TABLET | Freq: Two times a day (BID) | ORAL | 0 refills | Status: AC

## 2024-02-02 MED ORDER — OXYCODONE-ACETAMINOPHEN 5-325 MG PO TABS: 1.0000 | ORAL_TABLET | Freq: Four times a day (QID) | ORAL | 0 refills | Status: AC | PRN

## 2024-02-02 MED ORDER — OXYCODONE-ACETAMINOPHEN 5-325 MG PO TABS
1.0000 | ORAL_TABLET | Freq: Once | ORAL | Status: AC
  Administered 2024-02-02: 08:00:00 1 via ORAL
  Filled 2024-02-02: qty 1

## 2024-02-02 MED ORDER — ONDANSETRON 4 MG PO TBDP: 4.0000 mg | ORAL_TABLET | Freq: Three times a day (TID) | ORAL | 0 refills | Status: AC | PRN

## 2024-02-02 MED ORDER — IOHEXOL 350 MG/ML SOLN
75.0000 mL | Freq: Once | INTRAVENOUS | Status: AC | PRN
  Administered 2024-02-02: 10:00:00 75 mL via INTRAVENOUS

## 2024-02-02 MED ORDER — LACTATED RINGERS IV BOLUS: 1000.0000 mL | Freq: Once | INTRAVENOUS | Status: DC

## 2024-02-02 MED ORDER — METRONIDAZOLE 500 MG PO TABS
500.0000 mg | ORAL_TABLET | Freq: Once | ORAL | Status: AC
  Administered 2024-02-02: 12:00:00 500 mg via ORAL
  Filled 2024-02-02: qty 1

## 2024-02-02 MED ORDER — METRONIDAZOLE 500 MG PO TABS: 500.0000 mg | ORAL_TABLET | Freq: Three times a day (TID) | ORAL | 0 refills | Status: AC

## 2024-02-02 MED ORDER — MORPHINE SULFATE (PF) 4 MG/ML IV SOLN
4.0000 mg | Freq: Once | INTRAVENOUS | Status: AC
  Administered 2024-02-02: 12:00:00 4 mg via INTRAVENOUS
  Filled 2024-02-02: qty 1

## 2024-02-02 MED ORDER — CIPROFLOXACIN HCL 500 MG PO TABS
500.0000 mg | ORAL_TABLET | Freq: Once | ORAL | Status: AC
  Administered 2024-02-02: 12:00:00 500 mg via ORAL
  Filled 2024-02-02: qty 1

## 2024-02-02 NOTE — ED Notes (Signed)
Unsuccessful IV attempt.

## 2024-02-02 NOTE — Discharge Instructions (Signed)
 Your CT scan showed uncomplicated diverticulitis.  You were given your first doses of antibiotics in the emergency department.  Prescriptions for ongoing antibiotics were sent to your pharmacy.  Take as prescribed for the next 5 days.  Take oxycodone  as needed for pain.  Take ondansetron  as needed for nausea.  Drink plenty of fluids to stay hydrated.  Taking clear liquid diet for today and tomorrow.  Slowly advance to bland foods after that.  Return to the emergency department for any new or worsening symptoms of concern.

## 2024-02-02 NOTE — ED Triage Notes (Signed)
 Pt c/o abdominal pain since yesterday, endorses diarrhea.

## 2024-02-02 NOTE — ED Provider Notes (Signed)
 [place] EMERGENCY DEPARTMENT AT [facility] Provider Note   CSN: [id number] Arrival date & time: 02/02/24  9651     Patient presents with: Abdominal Pain   [name] is a 53 y.o. female.    Abdominal Pain Associated symptoms: diarrhea   Patient presents for abdominal pain.  Medical history includes HTN, diverticulitis s/p colectomy, HLD, emphysema, arthritis, OSA, GERD.  Yesterday at around 1 PM, she had onset of a left lower quadrant abdominal pain.  She has had multiple stools of various consistencies, including some diarrhea.  She has not had presence of blood in her stool.  Her pain has been persistent since yesterday.  Pain is reminiscent of diverticulitis that she has had in the past.  Current pain severity is 5/10.       Prior to Admission medications  Medication Sig Start Date End Date Taking? Authorizing Provider  ciprofloxacin  (CIPRO ) 500 MG tablet Take 1 tablet (500 mg total) by mouth every 12 (twelve) hours for 5 days. 02/02/24 02/07/24 Yes Melvenia Motto, MD  metroNIDAZOLE  (FLAGYL ) 500 MG tablet Take 1 tablet (500 mg total) by mouth 3 (three) times daily for 5 days. 02/02/24 02/07/24 Yes Melvenia Motto, MD  ondansetron  (ZOFRAN -ODT) 4 MG disintegrating tablet Take 1 tablet (4 mg total) by mouth every 8 (eight) hours as needed. 02/02/24  Yes Melvenia Motto, MD  oxyCODONE -acetaminophen  (PERCOCET/ROXICET) 5-325 MG tablet Take 1 tablet by mouth every 6 (six) hours as needed for up to 3 days for severe pain (pain score 7-10). 02/02/24 02/05/24 Yes Melvenia Motto, MD  albuterol  (VENTOLIN  HFA) 108 (90 Base) MCG/ACT inhaler Inhale 1-2 puffs into the lungs every 6 (six) hours as needed for wheezing or shortness of breath. 09/01/23   Thedora Garnette HERO, MD  amLODipine  (NORVASC ) 5 MG tablet TAKE 1 TABLET BY MOUTH DAILY 06/25/23   Thedora Garnette HERO, MD  atorvastatin  (LIPITOR) 20 MG tablet TAKE 1 TABLET BY MOUTH ONCE  DAILY 06/25/23   Thedora Garnette HERO, MD  cetirizine  (ZYRTEC ) 10 MG  tablet Take 1 tablet (10 mg total) by mouth daily. 11/14/23   Thedora Garnette HERO, MD  HYDROcodone -acetaminophen  (NORCO/VICODIN) 5-325 MG tablet Take 1 tablet by mouth every 4 (four) hours as needed. 01/22/24   [provider]  losartan  (COZAAR ) 50 MG tablet TAKE 1 TABLET BY MOUTH DAILY 01/08/24   Thedora Garnette HERO, MD  methocarbamol  (ROBAXIN ) 500 MG tablet Take 500 mg by mouth every 6 (six) hours as needed. 01/09/24   [provider]  Multiple Vitamins-Minerals (HAIR/SKIN/NAILS/BIOTIN  PO) Take 3 tablets by mouth daily.    [provider]  ondansetron  (ZOFRAN ) 4 MG tablet Take 4 mg by mouth every 8 (eight) hours as needed. 01/09/24   [provider]  oxyCODONE  (OXY IR/ROXICODONE ) 5 MG immediate release tablet Take 5 mg by mouth every 4 (four) hours as needed. 01/09/24   [provider]  tirzepatide  (ZEPBOUND ) 2.5 MG/0.5ML Pen Inject 2.5 mg into the skin once a week. 12/29/23   Thedora Garnette HERO, MD  tirzepatide  (ZEPBOUND ) 5 MG/0.5ML Pen Inject 5 mg into the skin once a week. Start after 28 days on the 2.5 mg weekly dose. 12/29/23   Thedora Garnette HERO, MD  UNABLE TO FIND Med Name: Remifemin (black cohosh )    [provider]    Allergies: Keflex [cephalexin], Penicillins, and Prednisone     Review of Systems  Gastrointestinal:  Positive for abdominal pain and diarrhea.  All other systems reviewed and are negative.  Updated Vital Signs BP 127/75 (BP Location: Left Arm)   Pulse 96   Temp 98.4 F (36.9 C)   Resp 18   Ht 5' 4 (1.626 m)   Wt 106.6 kg   SpO2 100%   BMI 40.34 kg/m   Physical Exam Vitals and nursing note reviewed.  Constitutional:      General: She is not in acute distress.    Appearance: She is well-developed. She is not ill-appearing, toxic-appearing or diaphoretic.  HENT:     Head: Normocephalic and atraumatic.  Eyes:     Conjunctiva/sclera: Conjunctivae normal.  Cardiovascular:     Rate and Rhythm: Normal rate and regular  rhythm.  Pulmonary:     Effort: Pulmonary effort is normal. No respiratory distress.     Breath sounds: Normal breath sounds.  Abdominal:     Palpations: Abdomen is soft.     Tenderness: There is abdominal tenderness in the left lower quadrant. There is no guarding or rebound.  Musculoskeletal:        General: No swelling.     Cervical back: Neck supple.  Skin:    General: Skin is warm and dry.  Neurological:     General: No focal deficit present.     Mental Status: She is alert and oriented to person, place, and time.  Psychiatric:        Mood and Affect: Mood normal.        Behavior: Behavior normal.     (all labs ordered are listed, but only abnormal results are displayed) Labs Reviewed  COMPREHENSIVE METABOLIC PANEL WITH GFR - Abnormal; Notable for the following components:      Result Value   CO2 20 (*)    Glucose, Bld 109 (*)    Anion gap 16 (*)    All other components within normal limits  CBC - Abnormal; Notable for the following components:   WBC 11.8 (*)    All other components within normal limits  URINALYSIS, ROUTINE W REFLEX MICROSCOPIC - Abnormal; Notable for the following components:   APPearance HAZY (*)    All other components within normal limits  LIPASE, BLOOD    EKG: None  Radiology: CT ABDOMEN PELVIS W CONTRAST Result Date: 02/02/2024 EXAM: CT ABDOMEN AND PELVIS WITH CONTRAST 02/02/2024 10:18:02 AM TECHNIQUE: CT of the abdomen and pelvis was performed with the administration of 75 mL of iohexol  (OMNIPAQUE ) 350 MG/ML injection. Multiplanar reformatted images are provided for review. Automated exposure control, iterative reconstruction, and/or weight-based adjustment of the mA/kV was utilized to reduce the radiation dose to as low as reasonably achievable. COMPARISON: CT abdomen and pelvis 04/27/2023 and earlier. CLINICAL HISTORY: 53 year old female with acute nonlocalized abdominal pain and diarrhea since yesterday. FINDINGS: LOWER CHEST: Lung base  pulmonary interstitial changes with evidence of centrilobular emphysema more apparent, increased bibasilar atelectasis also. No pleural effusion or confluent lung base opacity. LIVER: Evidence of chronic hepatic steatosis. Mild fatty sparing at the gallbladder fossa. GALLBLADDER AND BILE DUCTS: Gallbladder is unremarkable. No biliary ductal dilatation. SPLEEN: No acute abnormality. PANCREAS: No acute abnormality. ADRENAL GLANDS: No acute abnormality. KIDNEYS, URETERS AND BLADDER: No stones in the kidneys or ureters. No hydronephrosis. No perinephric or periureteral stranding. Urinary bladder is unremarkable. GI AND BOWEL: Stomach demonstrates no acute abnormality. Distal large bowel anastomosis in the sigmoid colon with no adverse features on series 3 image 63. Upstream of that, extensive diverticulosis. In the left abdomen, there is a 6 to 7 cm segment of acute bowel wall  thickening and moderate inflammation with surrounding mesenteric stranding on series 6 image 61. Extensive underlying diverticula in this region on the prior exam. No extraluminal gas. Dependent stranding along the left lateral conal fascia and left pericolic gutter. No drainable fluid. Upstream large bowel is decompressed and mildly redundant. Distal small bowel anastomosis on series 3 image 65 with no adverse features. Elongated and normal appendix tracking into the pelvis on series 3 image 69. There is no bowel obstruction. PERITONEUM AND RETROPERITONEUM: No ascites. No free air. Dependent stranding along the left lateral conal fascia and left pericolic gutter. No drainable fluid. VASCULATURE: Moderate calcified abdominal aortic atherosclerosis, aortoiliac atherosclerosis. Major arterial structures and the portal venous system remain patent. LYMPH NODES: No lymphadenopathy. REPRODUCTIVE ORGANS: Chronically absent uterus. Ovaries are stable and within normal limits. BONES AND SOFT TISSUES: No acute osseous abnormality. Chronic rectus muscle  diastasis. No ventral abdominal bowel hernia. Incidental pelvic phleboliths. No focal soft tissue abnormality. IMPRESSION: 1. Acute descending colon diverticulitis with moderate inflammation, No complicating features. 2. Emphysema at the lung bases. 3. Chronic hepatic steatosis. Electronically signed by: Helayne Hurst MD 02/02/2024 10:41 AM EST RP Workstation: [id number]     Procedures   Medications Ordered in the ED  morphine  (PF) 4 MG/ML injection 4 mg (has no administration in time range)  ciprofloxacin  (CIPRO ) tablet 500 mg (has no administration in time range)  metroNIDAZOLE  (FLAGYL ) tablet 500 mg (has no administration in time range)  oxyCODONE -acetaminophen  (PERCOCET/ROXICET) 5-325 MG per tablet 1 tablet (1 tablet Oral Given 02/02/24 0820)  iohexol  (OMNIPAQUE ) 350 MG/ML injection 75 mL (75 mLs Intravenous Contrast Given 02/02/24 1014)                                    Medical Decision Making Amount and/or Complexity of Data Reviewed Labs: ordered. Radiology: ordered.  Risk Prescription drug management.   This patient presents to the ED for concern of abdominal pain, this involves an extensive number of treatment options, and is a complaint that carries with it a high risk of complications and morbidity.  The differential diagnosis includes colitis, diverticulitis, UTI, nephrolithiasis, obstruction, musculoskeletal etiology   Co morbidities / Chronic conditions that complicate the patient evaluation  HTN, diverticulitis s/p colectomy, HLD, emphysema, arthritis, OSA, GERD   Additional history obtained:  Additional history obtained from EMR External records from outside source obtained and reviewed including N/A   Lab Tests:  I Ordered, and personally interpreted labs.  The pertinent results include: Mild leukocytosis is present.  Lab work and urinalysis otherwise unremarkable.   Imaging Studies ordered:  I ordered imaging studies including CT of abdomen and  pelvis I independently visualized and interpreted imaging which showed uncomplicated descending colon diverticulitis I agree with the radiologist interpretation   Cardiac Monitoring: / EKG:  The patient was maintained on a cardiac monitor.  I personally viewed and interpreted the cardiac monitored which showed an underlying rhythm of: Sinus rhythm   Problem List / ED Course / Critical interventions / Medication management  Patient presenting for left lower quadrant abdominal pain since yesterday afternoon.  She has had associated nonbloody soft stools with occasional diarrhea.  On arrival in the ED, vital signs are normal.  Patient is overall well-appearing.  She feels like her symptoms are reminiscent of prior episodes of diverticulitis.  Her lab work was obtained prior to being bedded in the ED.  Results are notable for mild leukocytosis.  Percocet was ordered for analgesia.  Will obtain CT imaging.  CT imaging shows descending colon uncomplicated diverticulitis.  On reassessment, patient's pain still present.  Dose of morphine  ordered.  Patient is in favor of treating at home.  Initial doses of antibiotics ordered in the ED.  Patient was discharged in stable condition. I ordered medication including Percocet and morphine  for analgesia; ciprofloxacin  and metronidazole  for diverticulitis Reevaluation of the patient after these medicines showed that the patient improved I have reviewed the patients home medicines and have made adjustments as needed  Social Determinants of Health:  Lives independently     Final diagnoses:  Diverticulitis    ED Discharge Orders          Ordered    ciprofloxacin  (CIPRO ) 500 MG tablet  Every 12 hours        02/02/24 1130    metroNIDAZOLE  (FLAGYL ) 500 MG tablet  3 times daily        02/02/24 1130    oxyCODONE -acetaminophen  (PERCOCET/ROXICET) 5-325 MG tablet  Every 6 hours PRN        02/02/24 1130    ondansetron  (ZOFRAN -ODT) 4 MG disintegrating tablet   Every 8 hours PRN        02/02/24 1131               Melvenia Motto, MD 02/02/24 1133

## 2024-02-09 NOTE — Telephone Encounter (Signed)
 Called patient and advised would let provider review message upon returning on 02/16/24.  Dm/cma

## 2024-02-20 ENCOUNTER — Telehealth: Payer: Self-pay

## 2024-02-20 NOTE — Telephone Encounter (Signed)
 Will look for the signed copy and then fax it again. Dm/cma

## 2024-02-20 NOTE — Telephone Encounter (Signed)
 Copied from CRM [id number]. Topic: General - Other >> Feb 20, 2024 11:37 AM Burnard DEL wrote: Reason for CRM: Maya from [facility] called in to ask about Doctors questionnaire that was faxed over on 02/05/2024. They have not received form back. Form looks to be scanned in patient chart. They also received an invoice for medical records,however they did not send a request for medical records,just for this form with the two questions to be completed by provider and faxed back.

## 2024-02-23 ENCOUNTER — Ambulatory Visit: Admitting: Obstetrics

## 2024-02-23 NOTE — Telephone Encounter (Signed)
 Form placed on providers desk to sign. Dm/cma

## 2024-02-23 NOTE — Telephone Encounter (Signed)
 Form has been signed and faxed to [facility] @  [phone]. Dm/cma

## 2024-03-01 ENCOUNTER — Encounter: Payer: Self-pay | Admitting: *Deleted

## 2024-04-27 ENCOUNTER — Ambulatory Visit: Admitting: Obstetrics
